# Patient Record
Sex: Female | Born: 1955 | Race: Black or African American | Hispanic: No | Smoking: Never smoker
Health system: Southern US, Community
[De-identification: ages and names within clinical notes are randomized; demographics above are authoritative.]

## PROBLEM LIST (undated history)

## (undated) DIAGNOSIS — E669 Obesity, unspecified: Secondary | ICD-10-CM

## (undated) DIAGNOSIS — M2550 Pain in unspecified joint: Secondary | ICD-10-CM

## (undated) DIAGNOSIS — Z9289 Personal history of other medical treatment: Secondary | ICD-10-CM

## (undated) DIAGNOSIS — M549 Dorsalgia, unspecified: Secondary | ICD-10-CM

## (undated) DIAGNOSIS — I10 Essential (primary) hypertension: Secondary | ICD-10-CM

## (undated) DIAGNOSIS — J302 Other seasonal allergic rhinitis: Secondary | ICD-10-CM

## (undated) DIAGNOSIS — N3090 Cystitis, unspecified without hematuria: Secondary | ICD-10-CM

## (undated) DIAGNOSIS — G8929 Other chronic pain: Secondary | ICD-10-CM

## (undated) DIAGNOSIS — K219 Gastro-esophageal reflux disease without esophagitis: Secondary | ICD-10-CM

## (undated) DIAGNOSIS — M2540 Effusion, unspecified joint: Secondary | ICD-10-CM

## (undated) DIAGNOSIS — IMO0001 Reserved for inherently not codable concepts without codable children: Secondary | ICD-10-CM

## (undated) DIAGNOSIS — R51 Headache: Secondary | ICD-10-CM

## (undated) DIAGNOSIS — E785 Hyperlipidemia, unspecified: Secondary | ICD-10-CM

## (undated) DIAGNOSIS — J45909 Unspecified asthma, uncomplicated: Secondary | ICD-10-CM

## (undated) DIAGNOSIS — B1920 Unspecified viral hepatitis C without hepatic coma: Secondary | ICD-10-CM

## (undated) DIAGNOSIS — H9190 Unspecified hearing loss, unspecified ear: Secondary | ICD-10-CM

## (undated) DIAGNOSIS — M1712 Unilateral primary osteoarthritis, left knee: Secondary | ICD-10-CM

## (undated) DIAGNOSIS — R351 Nocturia: Secondary | ICD-10-CM

## (undated) DIAGNOSIS — R519 Headache, unspecified: Secondary | ICD-10-CM

## (undated) HISTORY — DX: Headache, unspecified: R51.9

## (undated) HISTORY — DX: Unspecified viral hepatitis C without hepatic coma: B19.20

## (undated) HISTORY — PX: ABDOMINAL HYSTERECTOMY: SHX81

## (undated) HISTORY — PX: JOINT REPLACEMENT: SHX530

## (undated) HISTORY — DX: Headache: R51

## (undated) HISTORY — DX: Obesity, unspecified: E66.9

---

## 1998-03-03 ENCOUNTER — Emergency Department (HOSPITAL_COMMUNITY): Admission: EM | Admit: 1998-03-03 | Discharge: 1998-03-03 | Payer: Self-pay | Admitting: Emergency Medicine

## 1998-09-04 ENCOUNTER — Encounter: Payer: Self-pay | Admitting: Emergency Medicine

## 1998-09-04 ENCOUNTER — Emergency Department (HOSPITAL_COMMUNITY): Admission: EM | Admit: 1998-09-04 | Discharge: 1998-09-04 | Payer: Self-pay | Admitting: Emergency Medicine

## 1998-09-10 ENCOUNTER — Encounter: Admission: RE | Admit: 1998-09-10 | Discharge: 1998-09-10 | Payer: Self-pay | Admitting: *Deleted

## 1999-05-23 ENCOUNTER — Emergency Department (HOSPITAL_COMMUNITY): Admission: EM | Admit: 1999-05-23 | Discharge: 1999-05-23 | Payer: Self-pay | Admitting: Emergency Medicine

## 1999-05-23 ENCOUNTER — Encounter: Payer: Self-pay | Admitting: Emergency Medicine

## 1999-09-26 ENCOUNTER — Encounter: Payer: Self-pay | Admitting: Emergency Medicine

## 1999-09-26 ENCOUNTER — Emergency Department (HOSPITAL_COMMUNITY): Admission: EM | Admit: 1999-09-26 | Discharge: 1999-09-26 | Payer: Self-pay | Admitting: Emergency Medicine

## 1999-11-05 ENCOUNTER — Emergency Department (HOSPITAL_COMMUNITY): Admission: EM | Admit: 1999-11-05 | Discharge: 1999-11-05 | Payer: Self-pay

## 2000-07-07 ENCOUNTER — Encounter: Payer: Self-pay | Admitting: Emergency Medicine

## 2000-07-07 ENCOUNTER — Emergency Department (HOSPITAL_COMMUNITY): Admission: EM | Admit: 2000-07-07 | Discharge: 2000-07-07 | Payer: Self-pay | Admitting: Emergency Medicine

## 2000-08-10 ENCOUNTER — Emergency Department (HOSPITAL_COMMUNITY): Admission: EM | Admit: 2000-08-10 | Discharge: 2000-08-11 | Payer: Self-pay | Admitting: Internal Medicine

## 2001-04-15 ENCOUNTER — Emergency Department (HOSPITAL_COMMUNITY): Admission: EM | Admit: 2001-04-15 | Discharge: 2001-04-15 | Payer: Self-pay | Admitting: *Deleted

## 2001-04-15 ENCOUNTER — Encounter: Payer: Self-pay | Admitting: Emergency Medicine

## 2002-08-19 ENCOUNTER — Emergency Department (HOSPITAL_COMMUNITY): Admission: EM | Admit: 2002-08-19 | Discharge: 2002-08-20 | Payer: Self-pay | Admitting: Emergency Medicine

## 2002-08-20 ENCOUNTER — Encounter: Payer: Self-pay | Admitting: Emergency Medicine

## 2003-06-30 ENCOUNTER — Encounter: Admission: RE | Admit: 2003-06-30 | Discharge: 2003-06-30 | Payer: Self-pay | Admitting: Internal Medicine

## 2003-07-11 ENCOUNTER — Encounter: Admission: RE | Admit: 2003-07-11 | Discharge: 2003-10-09 | Payer: Self-pay | Admitting: Family Medicine

## 2003-08-21 ENCOUNTER — Emergency Department (HOSPITAL_COMMUNITY): Admission: EM | Admit: 2003-08-21 | Discharge: 2003-08-22 | Payer: Self-pay | Admitting: Emergency Medicine

## 2003-11-02 ENCOUNTER — Encounter: Admission: RE | Admit: 2003-11-02 | Discharge: 2004-01-31 | Payer: Self-pay | Admitting: Family Medicine

## 2004-02-26 ENCOUNTER — Emergency Department (HOSPITAL_COMMUNITY): Admission: EM | Admit: 2004-02-26 | Discharge: 2004-02-27 | Payer: Self-pay | Admitting: Emergency Medicine

## 2004-09-15 ENCOUNTER — Emergency Department (HOSPITAL_COMMUNITY): Admission: EM | Admit: 2004-09-15 | Discharge: 2004-09-15 | Payer: Self-pay | Admitting: Emergency Medicine

## 2005-02-07 ENCOUNTER — Emergency Department (HOSPITAL_COMMUNITY): Admission: EM | Admit: 2005-02-07 | Discharge: 2005-02-07 | Payer: Self-pay | Admitting: Emergency Medicine

## 2005-03-26 ENCOUNTER — Encounter: Admission: RE | Admit: 2005-03-26 | Discharge: 2005-04-15 | Payer: Self-pay | Admitting: Family Medicine

## 2005-04-12 ENCOUNTER — Emergency Department (HOSPITAL_COMMUNITY): Admission: EM | Admit: 2005-04-12 | Discharge: 2005-04-12 | Payer: Self-pay | Admitting: Emergency Medicine

## 2005-07-03 ENCOUNTER — Ambulatory Visit: Payer: Self-pay | Admitting: Obstetrics and Gynecology

## 2005-07-18 ENCOUNTER — Ambulatory Visit (HOSPITAL_COMMUNITY): Admission: RE | Admit: 2005-07-18 | Discharge: 2005-07-18 | Payer: Self-pay | Admitting: Obstetrics and Gynecology

## 2008-01-19 ENCOUNTER — Ambulatory Visit (HOSPITAL_COMMUNITY): Admission: RE | Admit: 2008-01-19 | Discharge: 2008-01-19 | Payer: Self-pay | Admitting: Family Medicine

## 2009-01-12 ENCOUNTER — Ambulatory Visit (HOSPITAL_BASED_OUTPATIENT_CLINIC_OR_DEPARTMENT_OTHER): Admission: RE | Admit: 2009-01-12 | Discharge: 2009-01-12 | Payer: Self-pay | Admitting: Orthopedic Surgery

## 2009-02-02 ENCOUNTER — Ambulatory Visit (HOSPITAL_COMMUNITY): Admission: RE | Admit: 2009-02-02 | Discharge: 2009-02-02 | Payer: Self-pay | Admitting: Family Medicine

## 2009-08-18 ENCOUNTER — Emergency Department (HOSPITAL_COMMUNITY): Admission: EM | Admit: 2009-08-18 | Discharge: 2009-08-18 | Payer: Self-pay | Admitting: Emergency Medicine

## 2010-02-01 ENCOUNTER — Ambulatory Visit (HOSPITAL_BASED_OUTPATIENT_CLINIC_OR_DEPARTMENT_OTHER): Admission: RE | Admit: 2010-02-01 | Discharge: 2010-02-01 | Payer: Self-pay | Admitting: Orthopedic Surgery

## 2010-03-18 ENCOUNTER — Ambulatory Visit (HOSPITAL_COMMUNITY): Admission: RE | Admit: 2010-03-18 | Discharge: 2010-03-18 | Payer: Self-pay | Admitting: Family Medicine

## 2010-08-08 LAB — POCT I-STAT, CHEM 8
BUN: 8 mg/dL (ref 6–23)
Creatinine, Ser: 0.7 mg/dL (ref 0.4–1.2)
Hemoglobin: 12.9 g/dL (ref 12.0–15.0)
Potassium: 3.6 mEq/L (ref 3.5–5.1)
Sodium: 142 mEq/L (ref 135–145)
TCO2: 27 mmol/L (ref 0–100)

## 2010-08-08 LAB — GLUCOSE, CAPILLARY
Glucose-Capillary: 150 mg/dL — ABNORMAL HIGH (ref 70–99)
Glucose-Capillary: 153 mg/dL — ABNORMAL HIGH (ref 70–99)

## 2010-08-08 LAB — POCT HEMOGLOBIN-HEMACUE: Hemoglobin: 11.4 g/dL — ABNORMAL LOW (ref 12.0–15.0)

## 2010-08-19 LAB — COMPREHENSIVE METABOLIC PANEL
BUN: 10 mg/dL (ref 6–23)
CO2: 24 mEq/L (ref 19–32)
Calcium: 9.8 mg/dL (ref 8.4–10.5)
Chloride: 107 mEq/L (ref 96–112)
Creatinine, Ser: 0.93 mg/dL (ref 0.4–1.2)
GFR calc non Af Amer: >60 mL/min (ref >60)
Glucose, Bld: 192 mg/dL — ABNORMAL HIGH (ref 70–99)
Total Bilirubin: 0.6 mg/dL (ref 0.3–1.2)

## 2010-08-19 LAB — DIFFERENTIAL
Basophils Absolute: 0.1 10*3/uL (ref 0.0–0.1)
Eosinophils Relative: 1 % (ref 0–5)
Lymphocytes Relative: 34 % (ref 12–46)
Neutro Abs: 3.5 10*3/uL (ref 1.7–7.7)
Neutrophils Relative %: 56 % (ref 43–77)

## 2010-08-19 LAB — CBC
HCT: 39.7 % (ref 36.0–46.0)
MCHC: 31.3 g/dL (ref 30.0–36.0)
MCV: 82.1 fL (ref 78.0–100.0)
RBC: 4.84 MIL/uL (ref 3.87–5.11)
WBC: 6.4 10*3/uL (ref 4.0–10.5)

## 2010-08-19 LAB — URINALYSIS, ROUTINE W REFLEX MICROSCOPIC
Hgb urine dipstick: NEGATIVE
Nitrite: NEGATIVE
Protein, ur: 30 mg/dL — AB
Specific Gravity, Urine: 1.033 — ABNORMAL HIGH (ref 1.005–1.030)
Urobilinogen, UA: 1 mg/dL (ref 0.0–1.0)

## 2010-08-19 LAB — POCT CARDIAC MARKERS: CKMB, poc: <1 ng/mL — ABNORMAL LOW (ref 1.0–8.0)

## 2010-08-19 LAB — URINE CULTURE: Colony Count: 25000

## 2010-08-19 LAB — LIPASE, BLOOD: Lipase: 19 U/L (ref 11–59)

## 2010-08-31 LAB — BASIC METABOLIC PANEL
CO2: 28 mEq/L (ref 19–32)
Calcium: 9.8 mg/dL (ref 8.4–10.5)
GFR calc Af Amer: >60 mL/min (ref >60)
GFR calc non Af Amer: >60 mL/min (ref >60)
Potassium: 3.9 mEq/L (ref 3.5–5.1)
Sodium: 143 mEq/L (ref 135–145)

## 2010-10-08 NOTE — Op Note (Signed)
Tabitha Oneal, Tabitha Oneal           ACCOUNT NO.:  [id number]   MEDICAL RECORD NO.:  [id number]          PATIENT TYPE:  AMB   LOCATION:  DSC                          FACILITY:  [facility]   PHYSICIAN:  Eulas Post, MD    DATE OF BIRTH:  Apr 19, 1956   DATE OF PROCEDURE:  01/12/2009  DATE OF DISCHARGE:                               OPERATIVE REPORT   SURGEON:  Eulas Post, MD   PREOPERATIVE DIAGNOSIS:  Left knee medial meniscus tear.   POSTOPERATIVE DIAGNOSES:  1. Left knee medial meniscus tear.  2. Patellar chondromalacia.   OPERATIVE PROCEDURE:  Left knee arthroscopy with partial medial  meniscectomy and patellar chondroplasty.   ANESTHESIA:  General.   ESTIMATED BLOOD LOSS:  Minimal.   PREOPERATIVE INDICATIONS:  Mrs. Tabitha Oneal is a 55 year old woman  who had chronic left knee pain.  She had medial-sided knee pain as well  as some difficulty with stairs.  She had a long course of conservative  therapy including over 6 months of injections, anti-inflammatories, and  activity modifications.  These did not succeed in resolving her symptoms  and she elected to undergo the above-named procedures.  The risks,  benefits, and alternatives were discussed with her preoperatively  including but not limited to the risks of infection, bleeding, nerve  injury, recurrent knee pain, arthrofibrosis, progression of arthritis,  cardiopulmonary complications, blood clots, among others, and she is  willing to proceed.   OPERATIVE FINDINGS:  The patella had grade 2 chondromalacia.  The  femoral trochlea was intact.  The lateral compartment was normal.  The  ACL and PCL were intact.  The medial compartment had grade 1 changes on  the femur and tibia with a complex posterior horn medial meniscus tear.   OPERATIVE PROCEDURE:  The patient was brought to the operating room and  placed in supine position.  Antibiotics were given.  The left lower  extremity was prepped and draped in the usual  sterile fashion.  Diagnostic arthroscopy was carried out with the above-named findings.  The medial meniscus was debrided with a combination of the arthroscopic  shaver and the arthroscopic biters.  This was cleaned back to a stable  rim.  The undersurface of the patella was also debrided using the  arthroscopic shaver.  Stable chondral surface was achieved.  The knee  was  irrigated copiously and all loose bodies removed, and the portals closed  with Monocryl followed by Steri-Strips and sterile gauze.  The knee was  injected with Marcaine.  There were no complications, and she tolerated  the procedure well.      Eulas Post, MD  Electronically Signed     JPL/MEDQ  D:  01/12/2009  T:  01/13/2009  Job:  [id number]

## 2011-03-21 ENCOUNTER — Ambulatory Visit
Admission: RE | Admit: 2011-03-21 | Discharge: 2011-03-21 | Disposition: A | Discharge status: Home / Self Care | Payer: Medicaid Other | Source: Ambulatory Visit | Attending: Family Medicine | Admitting: Family Medicine

## 2011-03-21 ENCOUNTER — Other Ambulatory Visit: Payer: Self-pay | Admitting: Family Medicine

## 2011-10-30 ENCOUNTER — Other Ambulatory Visit: Payer: Self-pay | Admitting: Family Medicine

## 2011-10-30 DIAGNOSIS — Z0000 Encounter for general adult medical examination without abnormal findings: Secondary | ICD-10-CM | POA: Diagnosis not present

## 2011-10-30 DIAGNOSIS — E7800 Pure hypercholesterolemia, unspecified: Secondary | ICD-10-CM | POA: Diagnosis not present

## 2011-10-30 DIAGNOSIS — I10 Essential (primary) hypertension: Secondary | ICD-10-CM | POA: Diagnosis not present

## 2011-10-30 DIAGNOSIS — Z1231 Encounter for screening mammogram for malignant neoplasm of breast: Secondary | ICD-10-CM

## 2011-10-30 DIAGNOSIS — J45909 Unspecified asthma, uncomplicated: Secondary | ICD-10-CM | POA: Diagnosis not present

## 2011-10-30 DIAGNOSIS — M949 Disorder of cartilage, unspecified: Secondary | ICD-10-CM | POA: Diagnosis not present

## 2011-10-30 DIAGNOSIS — J309 Allergic rhinitis, unspecified: Secondary | ICD-10-CM | POA: Diagnosis not present

## 2011-10-30 DIAGNOSIS — E119 Type 2 diabetes mellitus without complications: Secondary | ICD-10-CM | POA: Diagnosis not present

## 2011-10-30 DIAGNOSIS — M899 Disorder of bone, unspecified: Secondary | ICD-10-CM | POA: Diagnosis not present

## 2011-10-30 DIAGNOSIS — M159 Polyosteoarthritis, unspecified: Secondary | ICD-10-CM | POA: Diagnosis not present

## 2011-10-30 DIAGNOSIS — Z23 Encounter for immunization: Secondary | ICD-10-CM | POA: Diagnosis not present

## 2011-11-03 ENCOUNTER — Ambulatory Visit
Admission: RE | Admit: 2011-11-03 | Discharge: 2011-11-03 | Disposition: A | Discharge status: Home / Self Care | Payer: Medicare Other | Source: Ambulatory Visit | Attending: Family Medicine | Admitting: Family Medicine

## 2011-11-03 DIAGNOSIS — Z1231 Encounter for screening mammogram for malignant neoplasm of breast: Secondary | ICD-10-CM | POA: Diagnosis not present

## 2011-11-18 DIAGNOSIS — M949 Disorder of cartilage, unspecified: Secondary | ICD-10-CM | POA: Diagnosis not present

## 2011-11-18 DIAGNOSIS — M899 Disorder of bone, unspecified: Secondary | ICD-10-CM | POA: Diagnosis not present

## 2011-11-25 DIAGNOSIS — H9190 Unspecified hearing loss, unspecified ear: Secondary | ICD-10-CM | POA: Diagnosis not present

## 2011-12-10 DIAGNOSIS — M23329 Other meniscus derangements, posterior horn of medial meniscus, unspecified knee: Secondary | ICD-10-CM | POA: Diagnosis not present

## 2011-12-10 DIAGNOSIS — M23359 Other meniscus derangements, posterior horn of lateral meniscus, unspecified knee: Secondary | ICD-10-CM | POA: Diagnosis not present

## 2011-12-25 ENCOUNTER — Telehealth: Payer: Self-pay | Admitting: Obstetrics and Gynecology

## 2011-12-25 NOTE — Telephone Encounter (Signed)
Triage/general quest. 

## 2011-12-25 NOTE — Telephone Encounter (Signed)
Returned pt's call. Informed not record of her being a pt here. May have called incorrect office. Pt verbalizes comprehension.

## 2011-12-30 DIAGNOSIS — M79609 Pain in unspecified limb: Secondary | ICD-10-CM | POA: Diagnosis not present

## 2012-01-05 DIAGNOSIS — M79609 Pain in unspecified limb: Secondary | ICD-10-CM | POA: Diagnosis not present

## 2012-01-09 DIAGNOSIS — M1710 Unilateral primary osteoarthritis, unspecified knee: Secondary | ICD-10-CM | POA: Diagnosis not present

## 2012-01-12 DIAGNOSIS — M1710 Unilateral primary osteoarthritis, unspecified knee: Secondary | ICD-10-CM | POA: Diagnosis not present

## 2012-02-02 DIAGNOSIS — J309 Allergic rhinitis, unspecified: Secondary | ICD-10-CM | POA: Diagnosis not present

## 2012-02-02 DIAGNOSIS — J45909 Unspecified asthma, uncomplicated: Secondary | ICD-10-CM | POA: Diagnosis not present

## 2012-02-06 ENCOUNTER — Other Ambulatory Visit: Payer: Self-pay | Admitting: Orthopedic Surgery

## 2012-02-09 ENCOUNTER — Encounter (HOSPITAL_COMMUNITY)
Admission: RE | Admit: 2012-02-09 | Discharge: 2012-02-09 | Disposition: A | Discharge status: Home / Self Care | Payer: Medicare Other | Source: Ambulatory Visit | Attending: Orthopedic Surgery | Admitting: Orthopedic Surgery

## 2012-02-09 ENCOUNTER — Encounter (HOSPITAL_COMMUNITY): Payer: Self-pay

## 2012-02-09 DIAGNOSIS — Z96659 Presence of unspecified artificial knee joint: Secondary | ICD-10-CM | POA: Diagnosis not present

## 2012-02-09 DIAGNOSIS — M1710 Unilateral primary osteoarthritis, unspecified knee: Secondary | ICD-10-CM | POA: Diagnosis not present

## 2012-02-09 DIAGNOSIS — I10 Essential (primary) hypertension: Secondary | ICD-10-CM | POA: Diagnosis not present

## 2012-02-09 DIAGNOSIS — E119 Type 2 diabetes mellitus without complications: Secondary | ICD-10-CM | POA: Diagnosis not present

## 2012-02-09 DIAGNOSIS — D62 Acute posthemorrhagic anemia: Secondary | ICD-10-CM | POA: Diagnosis not present

## 2012-02-09 HISTORY — DX: Essential (primary) hypertension: I10

## 2012-02-09 HISTORY — DX: Unspecified asthma, uncomplicated: J45.909

## 2012-02-09 LAB — URINALYSIS, ROUTINE W REFLEX MICROSCOPIC
Bilirubin Urine: NEGATIVE
Ketones, ur: NEGATIVE mg/dL
Nitrite: NEGATIVE
Protein, ur: 30 mg/dL — AB
Urobilinogen, UA: 1 mg/dL (ref 0.0–1.0)

## 2012-02-09 LAB — ABO/RH: ABO/RH(D): O POS

## 2012-02-09 LAB — TYPE AND SCREEN: Antibody Screen: NEGATIVE

## 2012-02-09 LAB — BASIC METABOLIC PANEL
BUN: 9 mg/dL (ref 6–23)
Chloride: 103 mEq/L (ref 96–112)
GFR calc Af Amer: >90 mL/min (ref >90)
GFR calc non Af Amer: >90 mL/min (ref >90)
Glucose, Bld: 149 mg/dL — ABNORMAL HIGH (ref 70–99)
Potassium: 3.7 mEq/L (ref 3.5–5.1)
Sodium: 140 mEq/L (ref 135–145)

## 2012-02-09 LAB — URINE MICROSCOPIC-ADD ON

## 2012-02-09 LAB — CBC
HCT: 33.4 % — ABNORMAL LOW (ref 36.0–46.0)
Hemoglobin: 10.8 g/dL — ABNORMAL LOW (ref 12.0–15.0)
RBC: 4.19 MIL/uL (ref 3.87–5.11)

## 2012-02-09 LAB — PROTIME-INR
INR: 1.02 (ref 0.00–1.49)
Prothrombin Time: 13.6 seconds (ref 11.6–15.2)

## 2012-02-09 NOTE — Pre-Procedure Instructions (Signed)
20 Tabitha Oneal  02/09/2012   Your procedure is scheduled on:  02/23/12  Report to [facility] at 1130 AM.  Call this number if you have problems the morning of surgery: [phone]   Remember:   Do not eat food:After Midnight.    Take these medicines the morning of surgery with A SIP OF WATER: all inhalers        Do not wear jewelry, make-up or nail polish.  Do not wear lotions, powders, or perfumes. You may wear deodorant.  Do not shave 48 hours prior to surgery. Men may shave face and neck.  Do not bring valuables to the hospital.  Contacts, dentures or bridgework may not be worn into surgery.  Leave suitcase in the car. After surgery it may be brought to your room.  For patients admitted to the hospital, checkout time is 11:00 AM the day of discharge.   Patients discharged the day of surgery will not be allowed to drive home.  Name and phone number of your driver: husband  Special Instructions: CHG Shower Use Special Wash: 1/2 bottle night before surgery and 1/2 bottle morning of surgery.   Please read over the following fact sheets that you were given: Pain Booklet, Coughing and Deep Breathing, Blood Transfusion Information, MRSA Information and Surgical Site Infection Prevention

## 2012-02-22 MED ORDER — CEFAZOLIN SODIUM-DEXTROSE 2-3 GM-% IV SOLR: 2.0000 g | INTRAVENOUS | Status: DC

## 2012-02-23 ENCOUNTER — Encounter (HOSPITAL_COMMUNITY)
Admission: RE | Disposition: A | Discharge status: Home Health | Payer: Self-pay | Source: Ambulatory Visit | Attending: Orthopedic Surgery

## 2012-02-23 ENCOUNTER — Encounter (HOSPITAL_COMMUNITY): Payer: Self-pay | Admitting: Anesthesiology

## 2012-02-23 ENCOUNTER — Encounter (HOSPITAL_COMMUNITY): Payer: Self-pay | Admitting: *Deleted

## 2012-02-23 ENCOUNTER — Inpatient Hospital Stay (HOSPITAL_COMMUNITY): Payer: Medicare Other | Admitting: Anesthesiology

## 2012-02-23 ENCOUNTER — Inpatient Hospital Stay (HOSPITAL_COMMUNITY)
Admission: RE | Admit: 2012-02-23 | Discharge: 2012-02-29 | DRG: 470 | Disposition: A | Discharge status: Home Health | Payer: Medicare Other | Source: Ambulatory Visit | Attending: Orthopedic Surgery | Admitting: Orthopedic Surgery

## 2012-02-23 ENCOUNTER — Inpatient Hospital Stay (HOSPITAL_COMMUNITY): Payer: Medicare Other

## 2012-02-23 ENCOUNTER — Encounter (HOSPITAL_COMMUNITY): Payer: Self-pay | Admitting: Orthopedic Surgery

## 2012-02-23 DIAGNOSIS — M1710 Unilateral primary osteoarthritis, unspecified knee: Secondary | ICD-10-CM | POA: Diagnosis not present

## 2012-02-23 DIAGNOSIS — D62 Acute posthemorrhagic anemia: Secondary | ICD-10-CM | POA: Diagnosis not present

## 2012-02-23 DIAGNOSIS — I10 Essential (primary) hypertension: Secondary | ICD-10-CM | POA: Diagnosis present

## 2012-02-23 DIAGNOSIS — J45909 Unspecified asthma, uncomplicated: Secondary | ICD-10-CM | POA: Diagnosis present

## 2012-02-23 DIAGNOSIS — Z7901 Long term (current) use of anticoagulants: Secondary | ICD-10-CM | POA: Diagnosis not present

## 2012-02-23 DIAGNOSIS — G8918 Other acute postprocedural pain: Secondary | ICD-10-CM | POA: Diagnosis not present

## 2012-02-23 DIAGNOSIS — Z96659 Presence of unspecified artificial knee joint: Secondary | ICD-10-CM | POA: Diagnosis not present

## 2012-02-23 DIAGNOSIS — E119 Type 2 diabetes mellitus without complications: Secondary | ICD-10-CM | POA: Diagnosis present

## 2012-02-23 DIAGNOSIS — Z471 Aftercare following joint replacement surgery: Secondary | ICD-10-CM | POA: Diagnosis not present

## 2012-02-23 DIAGNOSIS — M1712 Unilateral primary osteoarthritis, left knee: Secondary | ICD-10-CM | POA: Diagnosis present

## 2012-02-23 DIAGNOSIS — Z79899 Other long term (current) drug therapy: Secondary | ICD-10-CM

## 2012-02-23 HISTORY — PX: TOTAL KNEE ARTHROPLASTY: SHX125

## 2012-02-23 HISTORY — DX: Unilateral primary osteoarthritis, left knee: M17.12

## 2012-02-23 LAB — GLUCOSE, CAPILLARY: Glucose-Capillary: 127 mg/dL — ABNORMAL HIGH (ref 70–99)

## 2012-02-23 SURGERY — ARTHROPLASTY, KNEE, TOTAL
Anesthesia: General | Site: Knee | Laterality: Left | Wound class: Clean

## 2012-02-23 MED ORDER — DIPHENHYDRAMINE HCL 12.5 MG/5ML PO ELIX
12.5000 mg | ORAL_SOLUTION | ORAL | Status: DC | PRN
  Administered 2012-02-24: 25 mg via ORAL
  Filled 2012-02-23: qty 5

## 2012-02-23 MED ORDER — METOCLOPRAMIDE HCL 10 MG PO TABS: 5.0000 mg | ORAL_TABLET | Freq: Three times a day (TID) | ORAL | Status: DC | PRN

## 2012-02-23 MED ORDER — BISACODYL 5 MG PO TBEC: 5.0000 mg | DELAYED_RELEASE_TABLET | Freq: Every day | ORAL | Status: DC | PRN

## 2012-02-23 MED ORDER — POLYETHYLENE GLYCOL 3350 17 G PO PACK: 17.0000 g | PACK | Freq: Every day | ORAL | Status: DC | PRN

## 2012-02-23 MED ORDER — METHOCARBAMOL 100 MG/ML IJ SOLN
500.0000 mg | Freq: Four times a day (QID) | INTRAVENOUS | Status: DC | PRN
  Administered 2012-02-23: 500 mg via INTRAVENOUS
  Filled 2012-02-23: qty 5

## 2012-02-23 MED ORDER — MIDAZOLAM HCL 2 MG/2ML IJ SOLN
INTRAMUSCULAR | Status: AC
  Filled 2012-02-23: qty 2

## 2012-02-23 MED ORDER — ALBUTEROL SULFATE HFA 108 (90 BASE) MCG/ACT IN AERS: 2.0000 | INHALATION_SPRAY | RESPIRATORY_TRACT | Status: DC | PRN

## 2012-02-23 MED ORDER — LACTATED RINGERS IV SOLN
INTRAVENOUS | Status: DC
  Administered 2012-02-23: 13:00:00 via INTRAVENOUS

## 2012-02-23 MED ORDER — ACETAMINOPHEN 325 MG PO TABS
650.0000 mg | ORAL_TABLET | Freq: Four times a day (QID) | ORAL | Status: DC | PRN
  Administered 2012-02-24 – 2012-02-28 (×5): 650 mg via ORAL
  Filled 2012-02-23 (×5): qty 2

## 2012-02-23 MED ORDER — INSULIN ASPART 100 UNIT/ML ~~LOC~~ SOLN
0.0000 [IU] | Freq: Three times a day (TID) | SUBCUTANEOUS | Status: DC
  Administered 2012-02-24 – 2012-02-25 (×5): 2 [IU] via SUBCUTANEOUS
  Administered 2012-02-26 (×2): 3 [IU] via SUBCUTANEOUS
  Administered 2012-02-26: 2 [IU] via SUBCUTANEOUS
  Administered 2012-02-27: 3 [IU] via SUBCUTANEOUS
  Administered 2012-02-27 – 2012-02-29 (×5): 2 [IU] via SUBCUTANEOUS

## 2012-02-23 MED ORDER — DOCUSATE SODIUM 100 MG PO CAPS
100.0000 mg | ORAL_CAPSULE | Freq: Two times a day (BID) | ORAL | Status: DC
  Administered 2012-02-23 – 2012-02-29 (×12): 100 mg via ORAL
  Filled 2012-02-23 (×13): qty 1

## 2012-02-23 MED ORDER — LINAGLIPTIN 5 MG PO TABS
5.0000 mg | ORAL_TABLET | Freq: Every day | ORAL | Status: DC
  Administered 2012-02-23 – 2012-02-29 (×7): 5 mg via ORAL
  Filled 2012-02-23 (×7): qty 1

## 2012-02-23 MED ORDER — FENTANYL CITRATE 0.05 MG/ML IJ SOLN
INTRAMUSCULAR | Status: AC
  Filled 2012-02-23: qty 2

## 2012-02-23 MED ORDER — ONDANSETRON HCL 4 MG/2ML IJ SOLN
INTRAMUSCULAR | Status: DC | PRN
  Administered 2012-02-23: 4 mg via INTRAVENOUS

## 2012-02-23 MED ORDER — ONDANSETRON HCL 4 MG PO TABS: 4.0000 mg | ORAL_TABLET | Freq: Four times a day (QID) | ORAL | Status: DC | PRN

## 2012-02-23 MED ORDER — ACETAMINOPHEN 10 MG/ML IV SOLN
INTRAVENOUS | Status: AC
  Filled 2012-02-23: qty 100

## 2012-02-23 MED ORDER — ALUM & MAG HYDROXIDE-SIMETH 200-200-20 MG/5ML PO SUSP: 30.0000 mL | ORAL | Status: DC | PRN

## 2012-02-23 MED ORDER — LIDOCAINE HCL (CARDIAC) 20 MG/ML IV SOLN
INTRAVENOUS | Status: DC | PRN
  Administered 2012-02-23: 50 mg via INTRAVENOUS

## 2012-02-23 MED ORDER — MIDAZOLAM HCL 2 MG/2ML IJ SOLN
1.0000 mg | INTRAMUSCULAR | Status: DC | PRN
  Administered 2012-02-23: 1 mg via INTRAVENOUS

## 2012-02-23 MED ORDER — SODIUM CHLORIDE 0.9 % IR SOLN
Status: DC | PRN
  Administered 2012-02-23: 3000 mL

## 2012-02-23 MED ORDER — ACETAMINOPHEN 650 MG RE SUPP: 650.0000 mg | Freq: Four times a day (QID) | RECTAL | Status: DC | PRN

## 2012-02-23 MED ORDER — MIDAZOLAM HCL 5 MG/5ML IJ SOLN
INTRAMUSCULAR | Status: DC | PRN
  Administered 2012-02-23: 2 mg via INTRAVENOUS

## 2012-02-23 MED ORDER — SENNA 8.6 MG PO TABS
1.0000 | ORAL_TABLET | Freq: Two times a day (BID) | ORAL | Status: DC
  Administered 2012-02-23 – 2012-02-29 (×12): 8.6 mg via ORAL
  Filled 2012-02-23 (×13): qty 1

## 2012-02-23 MED ORDER — WARFARIN - PHARMACIST DOSING INPATIENT
Freq: Every day | Status: DC
  Administered 2012-02-25: 18:00:00

## 2012-02-23 MED ORDER — OXYCODONE HCL 5 MG PO TABS
5.0000 mg | ORAL_TABLET | ORAL | Status: DC | PRN
  Administered 2012-02-23 – 2012-02-29 (×27): 10 mg via ORAL
  Filled 2012-02-23 (×27): qty 2

## 2012-02-23 MED ORDER — PHENOL 1.4 % MT LIQD
1.0000 | OROMUCOSAL | Status: DC | PRN
  Filled 2012-02-23: qty 177

## 2012-02-23 MED ORDER — METOCLOPRAMIDE HCL 5 MG/ML IJ SOLN: 5.0000 mg | Freq: Three times a day (TID) | INTRAMUSCULAR | Status: DC | PRN

## 2012-02-23 MED ORDER — AZELASTINE HCL 0.1 % NA SOLN
1.0000 | Freq: Two times a day (BID) | NASAL | Status: DC
  Administered 2012-02-23 – 2012-02-29 (×12): 1 via NASAL
  Filled 2012-02-23: qty 30

## 2012-02-23 MED ORDER — OXYCODONE-ACETAMINOPHEN 10-325 MG PO TABS: 1.0000 | ORAL_TABLET | Freq: Four times a day (QID) | ORAL | Status: DC | PRN

## 2012-02-23 MED ORDER — CEFAZOLIN SODIUM-DEXTROSE 2-3 GM-% IV SOLR
INTRAVENOUS | Status: AC
  Administered 2012-02-23: 2 g via INTRAVENOUS
  Filled 2012-02-23: qty 50

## 2012-02-23 MED ORDER — ACETAMINOPHEN 10 MG/ML IV SOLN
1000.0000 mg | Freq: Once | INTRAVENOUS | Status: AC | PRN
  Administered 2012-02-23: 1000 mg via INTRAVENOUS

## 2012-02-23 MED ORDER — MENTHOL 3 MG MT LOZG: 1.0000 | LOZENGE | OROMUCOSAL | Status: DC | PRN

## 2012-02-23 MED ORDER — PATIENT'S GUIDE TO USING COUMADIN BOOK
Freq: Once | Status: AC
  Administered 2012-02-23: 22:00:00
  Filled 2012-02-23: qty 1

## 2012-02-23 MED ORDER — INFLUENZA VIRUS VACC SPLIT PF IM SUSP
0.5000 mL | INTRAMUSCULAR | Status: AC
  Administered 2012-02-24: 0.5 mL via INTRAMUSCULAR
  Filled 2012-02-23: qty 0.5

## 2012-02-23 MED ORDER — LISINOPRIL 10 MG PO TABS
10.0000 mg | ORAL_TABLET | Freq: Every day | ORAL | Status: DC
  Administered 2012-02-23 – 2012-02-29 (×6): 10 mg via ORAL
  Filled 2012-02-23 (×7): qty 1

## 2012-02-23 MED ORDER — SENNA-DOCUSATE SODIUM 8.6-50 MG PO TABS: 1.0000 | ORAL_TABLET | Freq: Every day | ORAL | Status: DC

## 2012-02-23 MED ORDER — METHOCARBAMOL 500 MG PO TABS
500.0000 mg | ORAL_TABLET | Freq: Four times a day (QID) | ORAL | Status: DC | PRN
  Administered 2012-02-23 – 2012-02-28 (×5): 500 mg via ORAL
  Filled 2012-02-23 (×6): qty 1

## 2012-02-23 MED ORDER — ONDANSETRON HCL 4 MG/2ML IJ SOLN: 4.0000 mg | Freq: Four times a day (QID) | INTRAMUSCULAR | Status: DC | PRN

## 2012-02-23 MED ORDER — HYDROMORPHONE HCL PF 1 MG/ML IJ SOLN
INTRAMUSCULAR | Status: AC
  Filled 2012-02-23: qty 1

## 2012-02-23 MED ORDER — WARFARIN SODIUM 7.5 MG PO TABS
7.5000 mg | ORAL_TABLET | Freq: Every day | ORAL | Status: DC
  Administered 2012-02-23 – 2012-02-27 (×3): 7.5 mg via ORAL
  Filled 2012-02-23 (×6): qty 1

## 2012-02-23 MED ORDER — CEFAZOLIN SODIUM-DEXTROSE 2-3 GM-% IV SOLR
2.0000 g | Freq: Four times a day (QID) | INTRAVENOUS | Status: AC
  Administered 2012-02-23 – 2012-02-24 (×2): 2 g via INTRAVENOUS
  Filled 2012-02-23 (×2): qty 50

## 2012-02-23 MED ORDER — MONTELUKAST SODIUM 10 MG PO TABS
10.0000 mg | ORAL_TABLET | Freq: Every day | ORAL | Status: DC
  Administered 2012-02-23 – 2012-02-28 (×6): 10 mg via ORAL
  Filled 2012-02-23 (×7): qty 1

## 2012-02-23 MED ORDER — PROMETHAZINE HCL 25 MG PO TABS: 25.0000 mg | ORAL_TABLET | Freq: Four times a day (QID) | ORAL | Status: DC | PRN

## 2012-02-23 MED ORDER — METHOCARBAMOL 500 MG PO TABS: 500.0000 mg | ORAL_TABLET | Freq: Four times a day (QID) | ORAL | Status: DC

## 2012-02-23 MED ORDER — AZELASTINE HCL 0.15 % NA SOLN: 1.0000 | Freq: Two times a day (BID) | NASAL | Status: DC

## 2012-02-23 MED ORDER — ACETAMINOPHEN 10 MG/ML IV SOLN
1000.0000 mg | Freq: Four times a day (QID) | INTRAVENOUS | Status: AC
  Administered 2012-02-23 – 2012-02-24 (×3): 1000 mg via INTRAVENOUS
  Filled 2012-02-23 (×4): qty 100

## 2012-02-23 MED ORDER — HYDROMORPHONE HCL PF 1 MG/ML IJ SOLN: 1.0000 mg | INTRAMUSCULAR | Status: DC | PRN

## 2012-02-23 MED ORDER — POTASSIUM CHLORIDE IN NACL 20-0.45 MEQ/L-% IV SOLN
INTRAVENOUS | Status: DC
  Administered 2012-02-24: 16:00:00 via INTRAVENOUS
  Filled 2012-02-23 (×12): qty 1000

## 2012-02-23 MED ORDER — ONDANSETRON HCL 4 MG/2ML IJ SOLN: 4.0000 mg | Freq: Once | INTRAMUSCULAR | Status: DC | PRN

## 2012-02-23 MED ORDER — FENTANYL CITRATE 0.05 MG/ML IJ SOLN
INTRAMUSCULAR | Status: DC | PRN
  Administered 2012-02-23 (×6): 50 ug via INTRAVENOUS
  Administered 2012-02-23: 100 ug via INTRAVENOUS
  Administered 2012-02-23 (×2): 50 ug via INTRAVENOUS

## 2012-02-23 MED ORDER — ENOXAPARIN SODIUM 30 MG/0.3ML ~~LOC~~ SOLN
30.0000 mg | Freq: Two times a day (BID) | SUBCUTANEOUS | Status: DC
  Administered 2012-02-24 – 2012-02-27 (×7): 30 mg via SUBCUTANEOUS
  Filled 2012-02-23 (×9): qty 0.3

## 2012-02-23 MED ORDER — WARFARIN VIDEO
Freq: Once | Status: AC
  Administered 2012-02-23: 22:00:00

## 2012-02-23 MED ORDER — FLUTICASONE PROPIONATE HFA 44 MCG/ACT IN AERO
2.0000 | INHALATION_SPRAY | Freq: Two times a day (BID) | RESPIRATORY_TRACT | Status: DC
  Administered 2012-02-23 – 2012-02-29 (×12): 2 via RESPIRATORY_TRACT
  Filled 2012-02-23: qty 10.6

## 2012-02-23 MED ORDER — PROPOFOL 10 MG/ML IV BOLUS
INTRAVENOUS | Status: DC | PRN
  Administered 2012-02-23: 180 mg via INTRAVENOUS

## 2012-02-23 MED ORDER — WARFARIN SODIUM 5 MG PO TABS: 5.0000 mg | ORAL_TABLET | Freq: Every day | ORAL | Status: DC

## 2012-02-23 MED ORDER — FENTANYL CITRATE 0.05 MG/ML IJ SOLN
50.0000 ug | INTRAMUSCULAR | Status: DC | PRN
  Administered 2012-02-23: 50 ug via INTRAVENOUS

## 2012-02-23 MED ORDER — SORBITOL 70 % SOLN
30.0000 mL | Freq: Every day | Status: DC | PRN
  Administered 2012-02-27: 30 mL via ORAL
  Filled 2012-02-23: qty 30

## 2012-02-23 MED ORDER — LACTATED RINGERS IV SOLN
INTRAVENOUS | Status: DC | PRN
  Administered 2012-02-23 (×3): via INTRAVENOUS

## 2012-02-23 MED ORDER — HYDROMORPHONE HCL PF 1 MG/ML IJ SOLN
0.2500 mg | INTRAMUSCULAR | Status: DC | PRN
  Administered 2012-02-23 (×3): 0.5 mg via INTRAVENOUS

## 2012-02-23 MED ORDER — ATORVASTATIN CALCIUM 80 MG PO TABS
80.0000 mg | ORAL_TABLET | Freq: Every day | ORAL | Status: DC
  Administered 2012-02-23 – 2012-02-28 (×6): 80 mg via ORAL
  Filled 2012-02-23 (×7): qty 1

## 2012-02-23 SURGICAL SUPPLY — 56 items
APL SKNCLS STERI-STRIP NONHPOA (GAUZE/BANDAGES/DRESSINGS) ×1
BANDAGE ELASTIC 6 VELCRO ST LF (GAUZE/BANDAGES/DRESSINGS) ×4 IMPLANT
BANDAGE ESMARK 6X9 LF (GAUZE/BANDAGES/DRESSINGS) ×1 IMPLANT
BENZOIN TINCTURE PRP APPL 2/3 (GAUZE/BANDAGES/DRESSINGS) ×2 IMPLANT
BLADE SAG 18X100X1.27 (BLADE) ×2 IMPLANT
BLADE SAW RECIP 87.9 MT (BLADE) ×2 IMPLANT
BLADE SAW SGTL 13X75X1.27 (BLADE) ×2 IMPLANT
BNDG CMPR 9X6 STRL LF SNTH (GAUZE/BANDAGES/DRESSINGS) ×1
BNDG ESMARK 6X9 LF (GAUZE/BANDAGES/DRESSINGS) ×2
BOOTCOVER CLEANROOM LRG (PROTECTIVE WEAR) ×4 IMPLANT
BOWL SMART MIX CTS (DISPOSABLE) ×2 IMPLANT
CEMENT HV SMART SET (Cement) ×4 IMPLANT
CLOTH BEACON ORANGE TIMEOUT ST (SAFETY) ×2 IMPLANT
COVER SURGICAL LIGHT HANDLE (MISCELLANEOUS) ×2 IMPLANT
CUFF TOURNIQUET SINGLE 34IN LL (TOURNIQUET CUFF) ×1 IMPLANT
DRAPE EXTREMITY T 121X128X90 (DRAPE) ×2 IMPLANT
DRAPE PROXIMA HALF (DRAPES) ×2 IMPLANT
DRAPE U-SHAPE 47X51 STRL (DRAPES) ×2 IMPLANT
DRSG PAD ABDOMINAL 8X10 ST (GAUZE/BANDAGES/DRESSINGS) ×2 IMPLANT
DURAPREP 26ML APPLICATOR (WOUND CARE) ×2 IMPLANT
ELECT CAUTERY BLADE 6.4 (BLADE) ×2 IMPLANT
ELECT REM PT RETURN 9FT ADLT (ELECTROSURGICAL) ×2
ELECTRODE REM PT RTRN 9FT ADLT (ELECTROSURGICAL) ×1 IMPLANT
EVACUATOR 1/8 PVC DRAIN (DRAIN) IMPLANT
FACESHIELD LNG OPTICON STERILE (SAFETY) ×2 IMPLANT
GLOVE BIOGEL PI IND STRL 8 (GLOVE) ×2 IMPLANT
GLOVE BIOGEL PI INDICATOR 8 (GLOVE) ×2
GLOVE ORTHO TXT STRL SZ7.5 (GLOVE) ×2 IMPLANT
GLOVE SURG ORTHO 8.0 STRL STRW (GLOVE) ×2 IMPLANT
HANDPIECE INTERPULSE COAX TIP (DISPOSABLE) ×2
HOOD PEEL AWAY FACE SHEILD DIS (HOOD) ×4 IMPLANT
KIT BASIN OR (CUSTOM PROCEDURE TRAY) ×2 IMPLANT
KIT ROOM TURNOVER OR (KITS) ×2 IMPLANT
MANIFOLD NEPTUNE II (INSTRUMENTS) ×2 IMPLANT
NS IRRIG 1000ML POUR BTL (IV SOLUTION) ×2 IMPLANT
PACK TOTAL JOINT (CUSTOM PROCEDURE TRAY) ×2 IMPLANT
PAD ARMBOARD 7.5X6 YLW CONV (MISCELLANEOUS) ×4 IMPLANT
PAD CAST 4YDX4 CTTN HI CHSV (CAST SUPPLIES) ×1 IMPLANT
PADDING CAST COTTON 4X4 STRL (CAST SUPPLIES) ×2
PADDING CAST COTTON 6X4 STRL (CAST SUPPLIES) ×2 IMPLANT
SET HNDPC FAN SPRY TIP SCT (DISPOSABLE) ×1 IMPLANT
SPONGE GAUZE 4X4 12PLY (GAUZE/BANDAGES/DRESSINGS) ×2 IMPLANT
STAPLER VISISTAT 35W (STAPLE) ×2 IMPLANT
STRIP CLOSURE SKIN 1/2X4 (GAUZE/BANDAGES/DRESSINGS) ×2 IMPLANT
SUCTION FRAZIER TIP 10 FR DISP (SUCTIONS) ×2 IMPLANT
SUT MNCRL AB 4-0 PS2 18 (SUTURE) IMPLANT
SUT VIC AB 0 CT1 27 (SUTURE) ×2
SUT VIC AB 0 CT1 27XBRD ANBCTR (SUTURE) ×1 IMPLANT
SUT VIC AB 2-0 CT1 27 (SUTURE) ×2
SUT VIC AB 2-0 CT1 TAPERPNT 27 (SUTURE) ×1 IMPLANT
SUT VIC AB 3-0 SH 18 (SUTURE) ×2 IMPLANT
SYR 30ML LL (SYRINGE) ×1 IMPLANT
TOWEL OR 17X24 6PK STRL BLUE (TOWEL DISPOSABLE) ×2 IMPLANT
TOWEL OR 17X26 10 PK STRL BLUE (TOWEL DISPOSABLE) ×2 IMPLANT
TRAY FOLEY CATH 14FR (SET/KITS/TRAYS/PACK) ×1 IMPLANT
WATER STERILE IRR 1000ML POUR (IV SOLUTION) ×6 IMPLANT

## 2012-02-23 NOTE — H&P (Signed)
PREOPERATIVE H&P  Chief Complaint: DJD LEFT KNEE  HPI: Tabitha Oneal is a 56 y.o. female who presents for preoperative history and physical with a diagnosis of DJD LEFT KNEE. Symptoms are rated as moderate to severe, and have been worsening.  This is significantly impairing activities of daily living.  She has elected for surgical management. She has advanced patellofemoral disease, and has failed lateral release with arthroscopy, with grade 4 loss. She has been severely impaired, and at this point can barely walk. She uses a cane, and has a back kneed gait secondary to quad in addition due to the severe patellofemoral disease.  Past Medical History  Diagnosis Date  . Diabetes mellitus   . Hypertension   . Asthma    Past Surgical History  Procedure Date  . Joint replacement     acl   bil knees  . Abdominal hysterectomy    History   Social History  . Marital Status: Married    Spouse Name: N/A    Number of Children: N/A  . Years of Education: N/A   Social History Main Topics  . Smoking status: Never Smoker   . Smokeless tobacco: None  . Alcohol Use: No  . Drug Use: No  . Sexually Active:    Other Topics Concern  . None   Social History Narrative  . None   History reviewed. No pertinent family history. No Known Allergies Prior to Admission medications   Medication Sig Start Date End Date Taking? Authorizing Provider  albuterol (PROVENTIL HFA;VENTOLIN HFA) 108 (90 BASE) MCG/ACT inhaler Inhale 2 puffs into the lungs every 4 (four) hours as needed. For wheezing   Yes Historical Provider, MD  atorvastatin (LIPITOR) 80 MG tablet Take 80 mg by mouth daily.   Yes Historical Provider, MD  azelastine (ASTELIN) 137 MCG/SPRAY nasal spray Place 1 spray into the nose 2 (two) times daily. Use in each nostril as directed   Yes Historical Provider, MD  Azelastine HCl (ASTEPRO) 0.15 % SOLN Place 1 spray into the nose 2 (two) times daily.   Yes Historical Provider, MD    beclomethasone (QVAR) 80 MCG/ACT inhaler Inhale 2 puffs into the lungs 2 (two) times daily.   Yes Historical Provider, MD  Calcium Carbonate-Vitamin D (CALCIUM 600+D3 PO) Take 1 tablet by mouth daily.   Yes Historical Provider, MD  metFORMIN (GLUCOPHAGE) 500 MG tablet Take 500 mg by mouth daily with breakfast.   Yes Historical Provider, MD  montelukast (SINGULAIR) 10 MG tablet Take 10 mg by mouth at bedtime.   Yes Historical Provider, MD  quinapril (ACCUPRIL) 10 MG tablet Take 10 mg by mouth daily.   Yes Historical Provider, MD  sitaGLIPtin (JANUVIA) 100 MG tablet Take 100 mg by mouth daily.   Yes Historical Provider, MD     Positive ROS: All other systems have been reviewed and were otherwise negative with the exception of those mentioned in the HPI and as above.  Physical Exam: General: Alert, no acute distress Cardiovascular: No pedal edema Respiratory: No cyanosis, no use of accessory musculature GI: No organomegaly, abdomen is soft and non-tender Skin: No lesions in the area of chief complaint Neurologic: Sensation intact distally Psychiatric: Patient is competent for consent with normal mood and affect Lymphatic: No axillary or cervical lymphadenopathy  MUSCULOSKELETAL: Left knee has positive crepitance around the patellofemoral joint, with substantial quadriceps weakness and atrophy.  Assessment: DJD LEFT KNEE, primarily patellofemoral, with advanced disease  Plan: Plan for Procedure(s): TOTAL KNEE ARTHROPLASTY  The risks benefits and alternatives were discussed with the patient including but not limited to the risks of nonoperative treatment, versus surgical intervention including infection, bleeding, nerve injury,  blood clots, cardiopulmonary complications, morbidity, mortality, among others, and they were willing to proceed.   Marlinda Miranda P, MD Cell [phone] Pager [phone]  02/23/2012 1:03 PM

## 2012-02-23 NOTE — Anesthesia Postprocedure Evaluation (Signed)
  Anesthesia Post-op Note  Patient: Tabitha Oneal  Procedure(s) Performed: Procedure(s) (LRB) with comments: TOTAL KNEE ARTHROPLASTY (Left)  Patient Location: PACU  Anesthesia Type: General and GA combined with regional for post-op pain  Level of Consciousness: awake and alert   Airway and Oxygen Therapy: Patient Spontanous Breathing and Patient connected to nasal cannula oxygen  Post-op Pain: mild  Post-op Assessment: Post-op Vital signs reviewed  Post-op Vital Signs: Reviewed  Complications: No apparent anesthesia complications

## 2012-02-23 NOTE — Preoperative (Signed)
Beta Blockers   Reason not to administer Beta Blockers:Not Applicable 

## 2012-02-23 NOTE — Transfer of Care (Signed)
Immediate Anesthesia Transfer of Care Note  Patient: Tabitha Oneal  Procedure(s) Performed: Procedure(s) (LRB) with comments: TOTAL KNEE ARTHROPLASTY (Left)  Patient Location: PACU  Anesthesia Type: General  Level of Consciousness: awake, alert  and sedated  Airway & Oxygen Therapy: Patient Spontanous Breathing  Post-op Assessment: Report given to PACU RN  Post vital signs: stable  Complications: No apparent anesthesia complications

## 2012-02-23 NOTE — Anesthesia Preprocedure Evaluation (Addendum)
Anesthesia Evaluation  Patient identified by MRN, date of birth, ID band Patient awake    Reviewed: Allergy & Precautions, H&P , NPO status , Patient's Chart, lab work & pertinent test results  Airway Mallampati: II      Dental  (+) Edentulous Upper and Edentulous Lower   Pulmonary  breath sounds clear to auscultation        Cardiovascular Rhythm:Regular Rate:Normal     Neuro/Psych    GI/Hepatic   Endo/Other    Renal/GU      Musculoskeletal   Abdominal   Peds  Hematology   Anesthesia Other Findings   Reproductive/Obstetrics                           Anesthesia Physical Anesthesia Plan  ASA: III  Anesthesia Plan: General   Post-op Pain Management:    Induction: Intravenous  Airway Management Planned: Oral ETT  Additional Equipment:   Intra-op Plan:   Post-operative Plan: Extubation in OR  Informed Consent: I have reviewed the patients History and Physical, chart, labs and discussed the procedure including the risks, benefits and alternatives for the proposed anesthesia with the patient or authorized representative who has indicated his/her understanding and acceptance.     Plan Discussed with: CRNA and Surgeon  Anesthesia Plan Comments: (Type 2 DM glucose 127 Htn Asthma lungs-clear  Plan GA with oral ETT and femoral nerve block  Kipp Brood, MD )        Anesthesia Quick Evaluation

## 2012-02-23 NOTE — Progress Notes (Signed)
ANTICOAGULATION CONSULT NOTE - Initial Consult  Pharmacy Consult for Coumadin Indication: VTE prophylaxis  No Known Allergies  Patient Measurements: Wt = 96 kg  Labs: No results found for this basename: HGB:2,HCT:3,PLT:3,APTT:3,LABPROT:3,INR:3,HEPARINUNFRC:3,CREATININE:3,CKTOTAL:3,CKMB:3,TROPONINI:3 in the last 72 hours  CrCl is unknown because there is no height on file for the current visit.  Medical History: Past Medical History  Diagnosis Date  . Diabetes mellitus   . Hypertension   . Asthma   . Osteoarthritis of left knee 02/23/2012   Assessment: 56 year old to begin Coumadin for VTE prophylaxis s/p total knee  Goal of Therapy:  INR 2-3 Monitor platelets by anticoagulation protocol: Yes   Plan:  1) Coumadin 7.5 mg po daily at 1800 pm 2) Daily INR 3) Coumadin education  Thank you. Okey Regal, PharmD [phone]  02/23/2012,7:00 PM

## 2012-02-23 NOTE — Op Note (Signed)
DATE OF SURGERY:  02/23/2012 TIME: 3:32 PM  PATIENT NAME:  Tabitha Oneal   AGE: 56 y.o.    PRE-OPERATIVE DIAGNOSIS:  DJD LEFT KNEE  POST-OPERATIVE DIAGNOSIS:  Same  PROCEDURE:  Procedure(s): Left TOTAL KNEE ARTHROPLASTY   SURGEON:  Eulas Post, MD   ASSISTANT:  Janace Litten, OPA-C, present and scrubbed throughout the case, critical for assistance with exposure, retraction, instrumentation, and closure.   OPERATIVE IMPLANTS: Depuy PFC Sigma, Posterior Stabilized.  Femur size 3, Tibia size 3, Patella size 35 mm 3-peg oval button, with a 10 mm polyethylene insert.   PREOPERATIVE INDICATIONS:  Tabitha Oneal is a 56 y.o. year old female with end stage bone on bone degenerative arthritis of the knee who failed conservative treatment, including injections, antiinflammatories, activity modification, and assistive devices, and had significant impairment of their activities of daily living, and elected for Total Knee Arthroplasty. She had a previous left knee arthroscopy which demonstrated grade 4 chondral loss of the patellofemoral joint, and had a lateral release, but continued to have persistent symptoms. She is also known to have some medial compartment changes as well.  The risks, benefits, and alternatives were discussed at length including but not limited to the risks of infection, bleeding, nerve injury, stiffness, blood clots, the need for revision surgery, cardiopulmonary complications, among others, and they were willing to proceed.   OPERATIVE DESCRIPTION:  The patient was brought to the operative room and placed in a supine position.  General anesthesia was administered.  IV antibiotics were given.  The lower extremity was prepped and draped in the usual sterile fashion.  Time out was performed.  The leg was elevated and exsanguinated and the tourniquet was inflated.  Anterior quadriceps tendon splitting approach was performed.  The patella was everted and  osteophytes were removed.  The anterior horn of the medial and lateral meniscus was removed.   The distal femur was opened with the drill and the intramedullary distal femoral cutting jig was utilized, set at 5 degrees resecting 10 mm off the distal femur.  Care was taken to protect the collateral ligaments.  Then the extramedullary tibial cutting jig was utilized making the appropriate cut using the anterior tibial crest as a reference building in appropriate posterior slope.  Care was taken during the cut to protect the medial and collateral ligaments.  The proximal tibia was removed along with the posterior horns of the menisci.  The PCL was sacrificed.  My initial cut was very conservative. Also, she had minimal varus alignment, and so I only did one notch off the midline on the jig.  The extensor gap was measured and was initially tight, and so I recut the tibia, taking an additional 2 mm, and then after repeat measurement, it was approximately 10mm.    The distal femoral sizing jig was applied, taking care to avoid notching.  Then the 4-in-1 cutting jig was applied and the anterior and posterior femur was cut, along with the chamfer cuts.  The anterolateral cut however the saw blade impinge slightly on the tenaculum, removing a slight amount of deeper bone, than normal.  All posterior osteophytes were removed.  The flexion gap was then measured and was symmetric with the extension gap.  I completed the distal femoral preparation using the appropriate jig to prepare the box.  The patella was then measured, and cut with the saw.  This measured 22 mm, and then measured 14 mm after the cut.   The proximal tibia sized  and prepared accordingly with the reamer and the punch, and then all components were trialed with the 10mm poly insert.  The knee was found to have excellent balance and full motion.    The above named components were then cemented into place and all excess cement was removed.  The  trial polyethylene component was in place during cementation, and then was exchanged for the real polyethylene component.  During the placement of the tibial component, initially the bone was very soft, and rotationally it actually was internally rotated, which was corrected during final seating, however the metaphyseal bone was remarkably soft. It did not lock in the rotational control of the component adequately, however complete cementation was performed.   The knee was easily taken through a range of motion and the patella tracked well and the knee irrigated copiously and the parapatellar and subcutaneous tissue closed with vicryl, and monocryl with steri strips for the skin.  The wounds were injected with marcaine, and dressed with sterile gauze and the tourniquet released and the patient was awakened and returned to the PACU in stable and satisfactory condition.  There were no complications.  Total tourniquet time was ~80 minutes.

## 2012-02-23 NOTE — Anesthesia Procedure Notes (Addendum)
Anesthesia Regional Block:  Femoral nerve block  Pre-Anesthetic Checklist: ,, timeout performed, Correct Patient, Correct Site, Correct Laterality, Correct Procedure, Correct Position, site marked, Risks and benefits discussed,  Surgical consent,  Pre-op evaluation,  At surgeon's request and post-op pain management  Laterality: Left  Prep: chloraprep       Needles:  Injection technique: Single-shot  Needle Type: Echogenic Stimulator Needle      Needle Gauge: 22 and 22 G    Additional Needles:  Procedures: ultrasound guided and nerve stimulator Femoral nerve block Narrative:  Start time: 02/23/2012 12:30 PM End time: 02/23/2012 12:40 PM  Performed by: Personally   Additional Notes: 30 cc 0.5% Marcaine with 1:200 Epi injected easily  Kipp Brood, MD   Procedure Name: LMA Insertion Date/Time: 02/23/2012 1:51 PM Performed by: Rossie Muskrat L Pre-anesthesia Checklist: Patient identified, Timeout performed, Emergency Drugs available, Suction available and Patient being monitored Patient Re-evaluated:Patient Re-evaluated prior to inductionOxygen Delivery Method: Circle system utilized Preoxygenation: Pre-oxygenation with 100% oxygen Intubation Type: IV induction Ventilation: Mask ventilation without difficulty LMA: LMA inserted LMA Size: 4.0 Tube type: Oral Number of attempts: 1 Placement Confirmation: breath sounds checked- equal and bilateral and positive ETCO2 Tube secured with: Tape Dental Injury: Teeth and Oropharynx as per pre-operative assessment

## 2012-02-24 ENCOUNTER — Encounter (HOSPITAL_COMMUNITY): Payer: Self-pay | Admitting: Orthopedic Surgery

## 2012-02-24 LAB — BASIC METABOLIC PANEL
BUN: 7 mg/dL (ref 6–23)
Chloride: 101 mEq/L (ref 96–112)
GFR calc Af Amer: >90 mL/min (ref >90)
GFR calc non Af Amer: 78 mL/min — ABNORMAL LOW (ref >90)
Potassium: 3.8 mEq/L (ref 3.5–5.1)
Sodium: 138 mEq/L (ref 135–145)

## 2012-02-24 LAB — GLUCOSE, CAPILLARY
Glucose-Capillary: 175 mg/dL — ABNORMAL HIGH (ref 70–99)
Glucose-Capillary: 139 mg/dL — ABNORMAL HIGH (ref 70–99)
Glucose-Capillary: 133 mg/dL — ABNORMAL HIGH (ref 70–99)
Glucose-Capillary: 115 mg/dL — ABNORMAL HIGH (ref 70–99)

## 2012-02-24 LAB — CBC
HCT: 26.4 % — ABNORMAL LOW (ref 36.0–46.0)
Hemoglobin: 8.5 g/dL — ABNORMAL LOW (ref 12.0–15.0)
MCHC: 32.2 g/dL (ref 30.0–36.0)
RDW: 13.6 % (ref 11.5–15.5)
WBC: 7.8 10*3/uL (ref 4.0–10.5)

## 2012-02-24 NOTE — Progress Notes (Signed)
ANTICOAGULATION CONSULT - COUMADIN  Pharmacy Consult for Coumadin  HPI: 56 y.o.female admitted for DJD LEFT KNEE who is currently on Coumadin for VTE prophylaxis following L-TKA.  Allergies: No Known Allergies  Height/Weight: Height: 5' 2.99" (160 cm) Weight: 211 lb 3.2 oz (95.8 kg) IBW/kg (Calculated) : 52.38   Vitals: Blood pressure 122/60, pulse 95, temperature 99.4 F (37.4 C), temperature source Oral, resp. rate 16, height 5' 2.99" (1.6 m), weight 211 lb 3.2 oz (95.8 kg), SpO2 96.00%.  Current active problems: Principal Problem:  *Osteoarthritis of left knee  Medical / Surgical History: Past Medical History  Diagnosis Date  . Diabetes mellitus   . Hypertension   . Asthma   . Osteoarthritis of left knee 02/23/2012   Past Surgical History  Procedure Date  . Joint replacement     acl   bil knees  . Abdominal hysterectomy    Current Labs:  [facility] 02/24/12 0605  HGB 8.5*  HCT 26.4*  PLT 260  LABPROT 14.1  INR 1.10  CREATININE 0.82   Lab Results  Component Value Date   INR 1.10 02/24/2012   INR 1.02 02/09/2012   Estimated Creatinine Clearance: 83.4 ml/min (by C-G formula based on Cr of 0.82).  Pertinent Medication History: Prescriptions prior to admission  Medication Sig Dispense Refill  . albuterol (PROVENTIL HFA;VENTOLIN HFA) 108 (90 BASE) MCG/ACT inhaler Inhale 2 puffs into the lungs every 4 (four) hours as needed. For wheezing      . atorvastatin (LIPITOR) 80 MG tablet Take 80 mg by mouth daily.      Marland Kitchen azelastine (ASTELIN) 137 MCG/SPRAY nasal spray Place 1 spray into the nose 2 (two) times daily. Use in each nostril as directed      . beclomethasone (QVAR) 80 MCG/ACT inhaler Inhale 2 puffs into the lungs 2 (two) times daily.      . Calcium Carbonate-Vitamin D (CALCIUM 600+D3 PO) Take 1 tablet by mouth daily.      . metFORMIN (GLUCOPHAGE) 500 MG tablet Take 500 mg by mouth daily with breakfast.      . montelukast (SINGULAIR) 10 MG tablet Take 10 mg by  mouth at bedtime.      . quinapril (ACCUPRIL) 10 MG tablet Take 10 mg by mouth daily.      . sitaGLIPtin (JANUVIA) 100 MG tablet Take 100 mg by mouth daily.       Scheduled:    . acetaminophen      . acetaminophen  1,000 mg Intravenous Q6H  . atorvastatin  80 mg Oral q1800  . azelastine  1 spray Each Nare BID  . docusate sodium  100 mg Oral BID  . LOVENOX injection  30 mg Subcutaneous Q12H  . fluticasone  2 puff Inhalation BID  . insulin aspart  0-15 Units Subcutaneous TID WC  . linagliptin  5 mg Oral Daily  . lisinopril  10 mg Oral Daily  . montelukast  10 mg Oral QHS  . patient's guide to using coumadin book   Does not apply Once  . senna  1 tablet Oral BID  . warfarin  7.5 mg Oral q1800  . DISCONTD:  ceFAZolin (ANCEF) IV  2 g Intravenous Q6H    Assessment:  INR 1.1  today following initial dose of Coumadin.  No complications noted.  Goals:  Target INR of 2-3.  Plan:  Will continue Coumadin 7.5 mg daily as ordered.  Continue Lovenox 30 mg sq q 12 hours.    Daily INR's, CBC. Warf  ED initiated   Laurena Bering, Pharm. D. 02/24/2012, 9:38 AM

## 2012-02-24 NOTE — Progress Notes (Signed)
CARE MANAGEMENT NOTE 02/24/2012  Patient:  Tabitha Oneal, Tabitha Oneal   Account Number:  [id number]  Date Initiated:  02/24/2012  Documentation initiated by:  Vance Peper  Subjective/Objective Assessment:   56 yr old female s/p left total knee arthroplasty     Action/Plan:   CM spoke with patient regarding home health and DME needs at discharged. Choice offered. Rolling walker, 3in1 and CPM have been delivered to patient's home. Patient has family supposrt at discharge.   Anticipated DC Date:  02/25/2012   Anticipated DC Plan:  HOME W HOME HEALTH SERVICES      DC Planning Services  CM consult      [facility] Choice  HOME HEALTH   Choice offered to / List presented to:  C-1 Patient        HH arranged  HH-1 RN  HH-2 PT      [facility] agency  [facility]   Status of service:  Completed, signed off Medicare Important Message given?   (If response is "NO", the following Medicare IM given date fields will be blank) Date Medicare IM given:   Date Additional Medicare IM given:    Discharge Disposition:  HOME W HOME HEALTH SERVICES  Per UR Regulation:    If discussed at Long Length of Stay Meetings, dates discussed:    Comments:

## 2012-02-24 NOTE — Progress Notes (Signed)
Physical Therapy Evaluation Patient Details Name: Tabitha Oneal MRN: [id number] DOB: [birth year] Today's Date: 02/24/2012 Time: 0818-0901 PT Time Calculation (min): 43 min  PT Assessment / Plan / Recommendation Clinical Impression  Pt is a 56 y.o. F s/p L TKA.  Pt expressed feeling numbness in L LE digits and PA was informed.  Pt will benefit from acute physical therapy to increase independence, strength, ROM, safety, and endurance    PT Assessment  Patient needs continued PT services    Follow Up Recommendations  Home health PT    Barriers to Discharge None      Equipment Recommendations  None recommended by PT    Recommendations for Other Services Other (comment) (None)   Frequency 7X/week    Precautions / Restrictions Precautions Precautions: Knee Restrictions Weight Bearing Restrictions: Yes LLE Weight Bearing: Weight bearing as tolerated   Pertinent Vitals/Pain 9/10 pain in L LE after ambulation      Mobility  Bed Mobility Bed Mobility: Supine to Sit;Sitting - Scoot to Edge of Bed Supine to Sit: 4: Min assist Sitting - Scoot to [place] of Bed: 4: Min assist Details for Bed Mobility Assistance: (A) with lowering L LE OOB.  Cues for proper technique and hand placement Transfers Transfers: Sit to Stand;Stand to Sit Sit to Stand: 2: Max assist Stand to Sit: 3: Mod assist Details for Transfer Assistance: (A) with initiating mvt and slowing descent.  Cues for proper technique, hand placement, and safety Ambulation/Gait Ambulation/Gait Assistance: 4: Min assist Ambulation Distance (Feet): 15 Feet Assistive device: Rolling walker Ambulation/Gait Assistance Details: (A) with RW placement and balance.  Cues for proper technique, posture, and safety Gait Pattern: Step-to pattern;Decreased stride length;Antalgic Gait velocity: decreased Stairs: No Wheelchair Mobility Wheelchair Mobility: No    Shoulder Instructions     Exercises Total Joint Exercises Ankle  Circles/Pumps: AROM;Both;10 reps Quad Sets: AROM;Left;5 reps Heel Slides: AAROM;Left;5 reps Goniometric ROM: 25-58   PT Diagnosis: Difficulty walking;Abnormality of gait;Generalized weakness;Acute pain  PT Problem List: Decreased strength;Decreased range of motion;Decreased activity tolerance;Decreased balance;Decreased mobility;Pain;Decreased knowledge of use of DME PT Treatment Interventions: Therapeutic exercise;DME instruction;Gait training;Stair training;Functional mobility training;Therapeutic activities;Balance training;Neuromuscular re-education   PT Goals Acute Rehab PT Goals PT Goal Formulation: With patient Time For Goal Achievement: 03/02/12 Potential to Achieve Goals: Good Pt will go Supine/Side to Sit: with modified independence;with HOB 0 degrees PT Goal: Supine/Side to Sit - Progress: Goal set today Pt will go Sit to Supine/Side: with modified independence;with HOB 0 degrees PT Goal: Sit to Supine/Side - Progress: Goal set today Pt will go Sit to Stand: with modified independence;with upper extremity assist PT Goal: Sit to Stand - Progress: Goal set today Pt will go Stand to Sit: with modified independence;with upper extremity assist PT Goal: Stand to Sit - Progress: Goal set today Pt will Ambulate: 51 - 150 feet;with supervision;with least restrictive assistive device PT Goal: Ambulate - Progress: Goal set today Pt will Go Up / Down Stairs: 3-5 stairs;with min assist;with least restrictive assistive device PT Goal: Up/Down Stairs - Progress: Goal set today Pt will Perform Home Exercise Program: Independently PT Goal: Perform Home Exercise Program - Progress: Goal set today  Visit Information  Last PT Received On: 02/24/12 Assistance Needed: +1    Subjective Data  Subjective: Do I have to walk today? Patient Stated Goal: To go home   Prior Functioning  Home Living Lives With: Spouse Available Help at Discharge: Family Type of Home: House Home Access: Stairs  to enter Entrance  Stairs-Number of Steps: 4 Entrance Stairs-Rails: Right Home Layout: One level Bathroom Shower/Tub: Forensic scientist: Standard Bathroom Accessibility: Yes How Accessible: Accessible via walker Home Adaptive Equipment: Bedside commode/3-in-1;Quad cane;Straight cane;Walker - rolling Prior Function Level of Independence: Independent with assistive device(s) Able to Take Stairs?: Yes Driving: Yes Vocation: On disability Communication Communication: No difficulties Dominant Hand: Left    Cognition  Overall Cognitive Status: Appears within functional limits for tasks assessed/performed Arousal/Alertness: Awake/alert Orientation Level: Appears intact for tasks assessed Behavior During Session: [facility] for tasks performed    Extremity/Trunk Assessment Left Lower Extremity Assessment LLE Sensation: Deficits LLE Sensation Deficits: Pt states numbness in toes on L LE   Balance Balance Balance Assessed: No  End of Session PT - End of Session Equipment Utilized During Treatment: Gait belt Activity Tolerance: Patient tolerated treatment well Patient left: in chair;with call bell/phone within reach;with family/visitor present Nurse Communication: Mobility status  GP     DITOMMASO, AMY 02/24/2012, 9:22 AM  Jake Shark, PT DPT [phone]

## 2012-02-24 NOTE — Progress Notes (Signed)
Patient ID: Tabitha Oneal, female   DOB: 1955/09/08, 56 y.o.   MRN: [id number]     Subjective:  Patient reports pain as mild to moderate.  She states that she is very tired after just finishing PT.  Objective:   VITALS:   Filed Vitals:   02/24/12 0400 02/24/12 0500 02/24/12 0918 02/24/12 0920  BP:  122/60    Pulse:  95    Temp:  99.4 F (37.4 C)    TempSrc:      Resp: 18 16    Height:    5' 2.99" (1.6 m)  Weight:    95.8 kg (211 lb 3.2 oz)  SpO2: 97% 96% 96%     ABD soft Sensation intact distally Dorsiflexion/Plantar flexion intact Incision: dressing C/D/I and scant drainage  LABS  Results for orders placed during the hospital encounter of 02/23/12 (from the past 24 hour(s))  GLUCOSE, CAPILLARY     Status: Abnormal   Collection Time   02/23/12 11:40 AM      Component Value Range   Glucose-Capillary 127 (*) 70 - 99 mg/dL  GLUCOSE, CAPILLARY     Status: Abnormal   Collection Time   02/23/12  4:30 PM      Component Value Range   Glucose-Capillary 125 (*) 70 - 99 mg/dL  GLUCOSE, CAPILLARY     Status: Abnormal   Collection Time   02/23/12  9:24 PM      Component Value Range   Glucose-Capillary 175 (*) 70 - 99 mg/dL  PROTIME-INR     Status: Normal   Collection Time   02/24/12  6:05 AM      Component Value Range   Prothrombin Time 14.1  11.6 - 15.2 seconds   INR 1.10  0.00 - 1.49  CBC     Status: Abnormal   Collection Time   02/24/12  6:05 AM      Component Value Range   WBC 7.8  4.0 - 10.5 K/uL   RBC 3.32 (*) 3.87 - 5.11 MIL/uL   Hemoglobin 8.5 (*) 12.0 - 15.0 g/dL   HCT 40.9 (*) 81.1 - 91.4 %   MCV 79.5  78.0 - 100.0 fL   MCH 25.6 (*) 26.0 - 34.0 pg   MCHC 32.2  30.0 - 36.0 g/dL   RDW 78.2  95.6 - 21.3 %   Platelets 260  150 - 400 K/uL  BASIC METABOLIC PANEL     Status: Abnormal   Collection Time   02/24/12  6:05 AM      Component Value Range   Sodium 138  135 - 145 mEq/L   Potassium 3.8  3.5 - 5.1 mEq/L   Chloride 101  96 - 112 mEq/L   CO2 27  19 -  32 mEq/L   Glucose, Bld 146 (*) 70 - 99 mg/dL   BUN 7  6 - 23 mg/dL   Creatinine, Ser 0.86  0.50 - 1.10 mg/dL   Calcium 9.0  8.4 - 57.8 mg/dL   GFR calc non Af Amer 78 (*) >90 mL/min   GFR calc Af Amer >90  >90 mL/min  GLUCOSE, CAPILLARY     Status: Abnormal   Collection Time   02/24/12  6:51 AM      Component Value Range   Glucose-Capillary 139 (*) 70 - 99 mg/dL    X-ray Knee Left Port  02/23/2012  *RADIOLOGY REPORT*  Clinical Data: Status post left knee arthroplasty.  PORTABLE LEFT KNEE - 1-2 VIEW  Comparison: None.  Findings: Two views obtained postoperatively show normal alignment following total knee arthroplasty.  No significant abnormalities are identified.  There is some gas in the soft tissues anteriorly.  IMPRESSION: Normal alignment status post left knee arthroplasty.   Original Report Authenticated By: Reola Calkins, M.D.     Assessment/Plan: 1 Day Post-Op   Principal Problem:  *Osteoarthritis of left knee   Advance diet Up with therapy Continue PT  Will plan Dressing change tomorrow Will plan for DC later in week. ABLA will continue to monitor  Markitta Ausburn P 02/24/2012, 10:03 AM   Teryl Lucy, MD Cell [phone] Pager [phone]

## 2012-02-24 NOTE — Progress Notes (Signed)
Physical Therapy Progress Note   02/24/12 1600  PT Visit Information  Last PT Received On 02/24/12  Assistance Needed +1  PT Time Calculation  PT Start Time 1500  PT Stop Time 1526  PT Time Calculation (min) 26 min  Subjective Data  Subjective Going to do the same thing as this morning  Patient Stated Goal To go home  Precautions  Precautions Knee  Restrictions  Weight Bearing Restrictions Yes  LLE Weight Bearing WBAT  Cognition  Overall Cognitive Status Appears within functional limits for tasks assessed/performed  Arousal/Alertness Awake/alert  Orientation Level Appears intact for tasks assessed  Behavior During Session [facility] for tasks performed  Bed Mobility  Bed Mobility Supine to Sit;Sitting - Scoot to [place] of Bed;Sit to Supine  Supine to Sit 4: Min assist;HOB flat  Sitting - Scoot to [place] of Bed 4: Min assist  Sit to Supine 4: Min assist;HOB flat  Details for Bed Mobility Assistance (A) to support L LE during bed mobility.  Cues for hand placement and proper technique  Transfers  Transfers Sit to Stand;Stand to Sit  Sit to Stand 4: Min assist;With upper extremity assist;From bed;From toilet  Stand to Sit 4: Min assist;With upper extremity assist;To bed;To toilet  Details for Transfer Assistance (A) with initiating transfers and descent.  Cues for proper technique, hand placement, and L LE placement.  Ambulation/Gait  Ambulation/Gait Assistance 4: Min assist  Ambulation Distance (Feet) 55 Feet  Assistive device Rolling walker  Ambulation/Gait Assistance Details (A) with RW placement and balance.  Cues for proper technique, safety, and posture  Gait Pattern Step-to pattern;Decreased stride length;Antalgic  Gait velocity decreased  Stairs No  Balance  Balance Assessed No  PT - End of Session  Equipment Utilized During Treatment Gait belt  Activity Tolerance Patient tolerated treatment well  Patient left in bed;with call bell/phone within reach;with family/visitor  present  Nurse Communication Mobility status  PT - Assessment/Plan  Comments on Treatment Session Pt motivated to ambulate and able to increase distance.  Pt had increased pain and requested pain medication.  Nurse was informed.  Pt left on CPM 0-40  PT Plan Discharge plan remains appropriate;Frequency remains appropriate  PT Frequency 7X/week  Recommendations for Other Services Other (comment) (None)  Follow Up Recommendations Home health PT  Equipment Recommended None recommended by PT  Acute Rehab PT Goals  PT Goal Formulation With patient  Time For Goal Achievement 03/02/12  Potential to Achieve Goals Good  Pt will go Supine/Side to Sit with modified independence;with HOB 0 degrees  PT Goal: Supine/Side to Sit - Progress Progressing toward goal  Pt will go Sit to Supine/Side with modified independence;with HOB 0 degrees  PT Goal: Sit to Supine/Side - Progress Progressing toward goal  Pt will go Sit to Stand with modified independence;with upper extremity assist  PT Goal: Sit to Stand - Progress Progressing toward goal  Pt will go Stand to Sit with modified independence;with upper extremity assist  PT Goal: Stand to Sit - Progress Progressing toward goal  Pt will Ambulate 51 - 150 feet;with supervision;with least restrictive assistive device  PT Goal: Ambulate - Progress Partly met    Pt reports pain 10/10 in L LE.  Amy DiTommaso, SPT [place], [place] DPT [phone]

## 2012-02-24 NOTE — Progress Notes (Signed)
UR COMPLETED  

## 2012-02-24 NOTE — Addendum Note (Signed)
Addendum  created 02/24/12 1410 by Faraz Ponciano S Olinda Nola, CRNA   Modules edited:Anesthesia Medication Administration    

## 2012-02-24 NOTE — Evaluation (Signed)
Occupational Therapy Evaluation Patient Details Name: Tabitha Oneal MRN: [id number] DOB: 04/11/1956 Today's Date: 02/24/2012 Time: 4098-1191 OT Time Calculation (min): 25 min  OT Assessment / Plan / Recommendation Clinical Impression  56 yo female s/p Lt TKA that could benefit from skilled OT acutely. Recommend HHOT for d/c planning    OT Assessment  Patient needs continued OT Services    Follow Up Recommendations  Home health OT    Barriers to Discharge      Equipment Recommendations  None recommended by OT (has all neccessary DME)    Recommendations for Other Services    Frequency  Min 2X/week    Precautions / Restrictions Precautions Precautions: Knee Restrictions Weight Bearing Restrictions: Yes LLE Weight Bearing: Weight bearing as tolerated   Pertinent Vitals/Pain none    ADL  Eating/Feeding: Performed;Modified independent Where Assessed - Eating/Feeding: Chair Grooming: Performed;Wash/dry hands;Supervision/safety Where Assessed - Grooming: Unsupported standing Toilet Transfer: Performed;Moderate assistance Toilet Transfer Method: Sit to stand Toilet Transfer Equipment: Raised toilet seat with arms (or 3-in-1 over toilet) Toileting - Clothing Manipulation and Hygiene: Performed;Minimal assistance Where Assessed - Toileting Clothing Manipulation and Hygiene: Sit to stand from 3-in-1 or toilet Equipment Used: Gait belt;Rolling walker Transfers/Ambulation Related to ADLs: Pt ambulated to restroom with Mod v/c (pt repeating out loud sequence) and min (A) . pt progressing well. ADL Comments: pt educated on bed mobility and exiting the CPM machine. Pt reports "that wasnt bad". pt reports this morning was rough. Pt with decr pain this afternoon and progressing compared to transfers in the PT evaluation.    OT Diagnosis: Generalized weakness  OT Problem List: Decreased strength;Decreased activity tolerance;Impaired balance (sitting and/or standing);Decreased  knowledge of use of DME or AE;Decreased safety awareness;Decreased knowledge of precautions;Pain OT Treatment Interventions: Self-care/ADL training;Therapeutic exercise;DME and/or AE instruction;Therapeutic activities;Patient/family education;Balance training   OT Goals Acute Rehab OT Goals OT Goal Formulation: With patient Time For Goal Achievement: 03/09/12 Potential to Achieve Goals: Good ADL Goals Pt Will Perform Lower Body Bathing: with set-up;with adaptive equipment;Sit to stand from chair ADL Goal: Lower Body Bathing - Progress: Goal set today Pt Will Perform Lower Body Dressing: with set-up;Sit to stand from chair;with adaptive equipment ADL Goal: Lower Body Dressing - Progress: Goal set today Pt Will Transfer to Toilet: with modified independence;Ambulation;with DME;3-in-1 ADL Goal: Toilet Transfer - Progress: Goal set today Pt Will Perform Tub/Shower Transfer: Tub transfer;with set-up;Ambulation ADL Goal: Tub/Shower Transfer - Progress: Goal set today Miscellaneous OT Goals Miscellaneous OT Goal #1: Pt will complete bed mobility HOB flat no rails as precursor for adls OT Goal: Miscellaneous Goal #1 - Progress: Goal set today  Visit Information  Last OT Received On: 02/24/12 Assistance Needed: +1    Subjective Data  Subjective: "can you help me to the bathroom after he finishes?"- pt requesting restroom s/p RN assessment Patient Stated Goal: to go home with daughter and husband (A)   Prior Functioning     Home Living Lives With: Spouse Available Help at Discharge: Family Type of Home: House Home Access: Stairs to enter Secretary/administrator of Steps: 4 Entrance Stairs-Rails: Right Home Layout: One level Bathroom Shower/Tub: Forensic scientist: Standard Bathroom Accessibility: Yes How Accessible: Accessible via walker Home Adaptive Equipment: Bedside commode/3-in-1;Quad cane;Straight cane;Walker - rolling Prior Function Level of  Independence: Independent with assistive device(s) Able to Take Stairs?: Yes Driving: Yes Vocation: On disability Communication Communication: No difficulties Dominant Hand: Left         Vision/Perception     Cognition  Overall Cognitive Status: Appears within functional limits for tasks assessed/performed Arousal/Alertness: Awake/alert Orientation Level: Appears intact for tasks assessed Behavior During Session: [facility] for tasks performed    Extremity/Trunk Assessment Right Upper Extremity Assessment RUE ROM/Strength/Tone: Within functional levels Left Upper Extremity Assessment LUE ROM/Strength/Tone: Within functional levels Trunk Assessment Trunk Assessment: Normal     Mobility Bed Mobility Supine to Sit: 4: Min assist;HOB flat;With rails Sitting - Scoot to Edge of Bed: 4: Min assist;With rail Details for Bed Mobility Assistance: mod v/c for sequence and hand placement. Pt following commands well and repeating information back to therapist Transfers Sit to Stand: 4: Min assist;With upper extremity assist;From bed Stand to Sit: 4: Min assist;With upper extremity assist;To chair/3-in-1 (Lt LE supported) Details for Transfer Assistance: (A) with Lt LE and using UE to slowly descent      Shoulder Instructions     Exercise     Balance     End of Session OT - End of Session Activity Tolerance: Patient tolerated treatment well Patient left: with call bell/phone within reach;in chair Nurse Communication: Mobility status;Precautions (requesting Female for all hygiene)  GO     Harrel Carina [facility] 02/24/2012, 12:16 PM Pager: [phone]

## 2012-02-24 NOTE — Addendum Note (Signed)
Addendum  created 02/24/12 1410 by Marena Chancy, CRNA   Modules edited:Anesthesia Medication Administration

## 2012-02-25 LAB — CBC
HCT: 24.9 % — ABNORMAL LOW (ref 36.0–46.0)
Hemoglobin: 8 g/dL — ABNORMAL LOW (ref 12.0–15.0)
MCH: 25.3 pg — ABNORMAL LOW (ref 26.0–34.0)
MCHC: 32.1 g/dL (ref 30.0–36.0)
MCV: 78.8 fL (ref 78.0–100.0)
RDW: 13.7 % (ref 11.5–15.5)

## 2012-02-25 MED ORDER — WARFARIN SODIUM 7.5 MG PO TABS
7.5000 mg | ORAL_TABLET | Freq: Once | ORAL | Status: AC
  Administered 2012-02-25: 7.5 mg via ORAL
  Filled 2012-02-25: qty 1

## 2012-02-25 NOTE — Progress Notes (Signed)
Patient ID: Tabitha Oneal, female   DOB: [birth year], 56 y.o.   MRN: [id number]     Subjective:  Patient reports pain as mild to moderate.  Patient states that PT is hard but she is able to do it and would like to go home, just not today.  Objective:   VITALS:   Filed Vitals:   02/24/12 2324 02/25/12 0146 02/25/12 0400 02/25/12 0501  BP:  119/59  142/72  Pulse:  115  115  Temp:  99.1 F (37.3 C)  99.1 F (37.3 C)  TempSrc:  Oral  Oral  Resp: 17 18 17 18   Height:      Weight:      SpO2: 97% 93% 92% 92%    ABD soft Sensation intact distally Dorsiflexion/Plantar flexion intact Incision: dressing C/D/I and no drainage  LABS  Results for orders placed during the hospital encounter of 02/23/12 (from the past 24 hour(s))  GLUCOSE, CAPILLARY     Status: Abnormal   Collection Time   02/24/12 11:22 AM      Component Value Range   Glucose-Capillary 133 (*) 70 - 99 mg/dL   Comment 1 Documented in Chart     Comment 2 Notify RN    GLUCOSE, CAPILLARY     Status: Abnormal   Collection Time   02/24/12  4:17 PM      Component Value Range   Glucose-Capillary 115 (*) 70 - 99 mg/dL   Comment 1 Documented in Chart     Comment 2 Notify RN    GLUCOSE, CAPILLARY     Status: Abnormal   Collection Time   02/25/12 12:08 AM      Component Value Range   Glucose-Capillary 197 (*) 70 - 99 mg/dL  PROTIME-INR     Status: Abnormal   Collection Time   02/25/12  6:32 AM      Component Value Range   Prothrombin Time 16.6 (*) 11.6 - 15.2 seconds   INR 1.38  0.00 - 1.49  CBC     Status: Abnormal   Collection Time   02/25/12  6:32 AM      Component Value Range   WBC 10.7 (*) 4.0 - 10.5 K/uL   RBC 3.16 (*) 3.87 - 5.11 MIL/uL   Hemoglobin 8.0 (*) 12.0 - 15.0 g/dL   HCT 40.9 (*) 81.1 - 91.4 %   MCV 78.8  78.0 - 100.0 fL   MCH 25.3 (*) 26.0 - 34.0 pg   MCHC 32.1  30.0 - 36.0 g/dL   RDW 78.2  95.6 - 21.3 %   Platelets 215  150 - 400 K/uL  GLUCOSE, CAPILLARY     Status: Abnormal   Collection  Time   02/25/12  7:12 AM      Component Value Range   Glucose-Capillary 149 (*) 70 - 99 mg/dL    X-ray Knee Left Port  02/23/2012  *RADIOLOGY REPORT*  Clinical Data: Status post left knee arthroplasty.  PORTABLE LEFT KNEE - 1-2 VIEW  Comparison: None.  Findings: Two views obtained postoperatively show normal alignment following total knee arthroplasty.  No significant abnormalities are identified.  There is some gas in the soft tissues anteriorly.  IMPRESSION: Normal alignment status post left knee arthroplasty.   Original Report Authenticated By: Reola Calkins, M.D.     Assessment/Plan: 2 Days Post-Op   Principal Problem:  *Osteoarthritis of left knee   Advance diet Up with therapy Plan for discharge tomorrow Talked with family and patient about  home Vs. SNF and patient would still like to go home if she is able ABLA, expected, stabilizing, observe.   Macky Galik P 02/25/2012, 8:20 AM   Teryl Lucy, MD Cell [phone] Pager [phone]

## 2012-02-25 NOTE — Progress Notes (Signed)
Orthopedic Tech Progress Note Patient Details:  Tabitha Oneal [birth year] [id number]  Ortho Devices Type of Ortho Device: Knee Immobilizer Ortho Device/Splint Location: left knee immobilizer Ortho Device/Splint Interventions: Application   Shawnie Pons 02/25/2012, 10:08 AM

## 2012-02-25 NOTE — Progress Notes (Signed)
ANTICOAGULATION CONSULT - COUMADIN  Pharmacy Consult for Coumadin  HPI: 56 y.o.female admitted for DJD LEFT KNEE who is currently on Coumadin for VTE prophylaxis following L-TKA.  Allergies: No Known Allergies  Height/Weight: Height: 5' 2.99" (160 cm) Weight: 211 lb 3.2 oz (95.8 kg) IBW/kg (Calculated) : 52.38   Vitals: Blood pressure 142/72, pulse 115, temperature 99.1 F (37.3 C), temperature source Oral, resp. rate 18, height 5' 2.99" (1.6 m), weight 211 lb 3.2 oz (95.8 kg), SpO2 94.00%.  Current active problems: Principal Problem:  *Osteoarthritis of left knee  Medical / Surgical History: Diagnosis  . Diabetes mellitus  . Hypertension  . Asthma  . Osteoarthritis of left knee   Past Surgical History  Procedure Date  . Joint replacement     acl   bil knees  . Abdominal hysterectomy   . Total knee arthroplasty 02/23/2012    Procedure: TOTAL KNEE ARTHROPLASTY;  Laterality: Left;    Current Labs:  [facility] 02/25/12 0632 02/24/12 0605  HGB 8.0* 8.5*  HCT 24.9* 26.4*  PLT 215 260  LABPROT 16.6* 14.1  INR 1.38 1.10  CREATININE -- 0.82   Lab Results  Component Value Date   INR 1.38 02/25/2012   INR 1.10 02/24/2012   INR 1.02 02/09/2012   Estimated Creatinine Clearance: 83.4 ml/min (by C-G formula based on Cr of 0.82).  Pertinent Medication History: Scheduled:    . acetaminophen  1,000 mg Intravenous Q6H  . atorvastatin  80 mg Oral q1800  . azelastine  1 spray Each Nare BID  . docusate sodium  100 mg Oral BID  . enoxaparin (LOVENOX) injection  30 mg Subcutaneous Q12H  . fluticasone  2 puff Inhalation BID  . insulin aspart  0-15 Units Subcutaneous TID WC  . linagliptin  5 mg Oral Daily  . lisinopril  10 mg Oral Daily  . montelukast  10 mg Oral QHS  . senna  1 tablet Oral BID  . warfarin  7.5 mg Oral q1800   Assessment:  INR trending up 1.38.  No acute bleeding complications..  Goals:  Target INR of 2-3.  Plan:  Will repeat Coumadin 7.5 mg  today.    Daily INR's, CBC. Coumadin discharge education completed.  Dom Haverland, Elisha Headland, Pharm. D. 02/25/2012, 11:35 AM

## 2012-02-25 NOTE — Progress Notes (Signed)
Occupational Therapy Treatment Patient Details Name: Tabitha Oneal MRN: [id number] DOB: Jun 27, 1955 Today's Date: 02/25/2012 Time: 4098-1191 OT Time Calculation (min): 36 min  OT Assessment / Plan / Recommendation Comments on Treatment Session Pt is adequate level for d/c from OT stand point. Pt requesting home health to adddress tub transfer    Follow Up Recommendations  Home health OT    Barriers to Discharge       Equipment Recommendations  None recommended by OT    Recommendations for Other Services    Frequency Min 2X/week   Plan Discharge plan remains appropriate    Precautions / Restrictions Precautions Precautions: Knee Required Braces or Orthoses: Knee Immobilizer - Left Restrictions Weight Bearing Restrictions: Yes LLE Weight Bearing: Weight bearing as tolerated   Pertinent Vitals/Pain None reported    ADL  Lower Body Dressing: Performed;Minimal assistance Where Assessed - Lower Body Dressing: Unsupported sit to stand Toilet Transfer: Simulated;Supervision/safety Toilet Transfer Method: Sit to stand Toilet Transfer Equipment: Raised toilet seat with arms (or 3-in-1 over toilet) Equipment Used: Rolling walker Transfers/Ambulation Related to ADLs: Pt completed   ADL Comments: pt husband and daughter educated on dressing LB , Ki application correctly, pt don KI with Min (A) due to first attempt, risk of falling with slippers, and avoid knee flexion with sitting in recliner. Pt and husband educated on elevating surface of chair for sit<>Stand at home. Pt return demo and verbalized with teach back    OT Diagnosis:    OT Problem List:   OT Treatment Interventions:     OT Goals Acute Rehab OT Goals OT Goal Formulation: With patient Time For Goal Achievement: 03/09/12 Potential to Achieve Goals: Good ADL Goals Pt Will Perform Lower Body Bathing: with set-up;with adaptive equipment;Sit to stand from chair ADL Goal: Lower Body Bathing - Progress: Met Pt Will  Perform Lower Body Dressing: with set-up;Sit to stand from chair;with adaptive equipment ADL Goal: Lower Body Dressing - Progress: Met Pt Will Transfer to Toilet: with modified independence;Ambulation;with DME;3-in-1 ADL Goal: Toilet Transfer - Progress: Progressing toward goals Pt Will Perform Tub/Shower Transfer: Tub transfer;with set-up;Ambulation Miscellaneous OT Goals Miscellaneous OT Goal #1: Pt will complete bed mobility HOB flat no rails as precursor for adls  Visit Information  Last OT Received On: 02/25/12 Assistance Needed: +1    Subjective Data      Prior Functioning       Cognition  Overall Cognitive Status: Appears within functional limits for tasks assessed/performed Arousal/Alertness: Awake/alert Orientation Level: Appears intact for tasks assessed Behavior During Session: [facility] for tasks performed    Mobility  Shoulder Instructions Bed Mobility Bed Mobility: Not assessed Transfers Sit to Stand: 5: Supervision;With upper extremity assist;From chair/3-in-1 Stand to Sit: 5: Supervision;With upper extremity assist;To chair/3-in-1 Details for Transfer Assistance: pt progressing well       Exercises  Total Joint Exercises Ankle Circles/Pumps: AROM;Both;10 reps Quad Sets: AROM;Left;5 reps Heel Slides: AAROM;Left;5 reps   Balance Balance Balance Assessed: No   End of Session    GO     Lucile Shutters 02/25/2012, 2:26 PM Pager: [phone]

## 2012-02-25 NOTE — Progress Notes (Signed)
Physical Therapy Progress Note   02/25/12 1500  PT Visit Information  Last PT Received On 02/25/12  Assistance Needed +1  PT Time Calculation  PT Start Time 1353  PT Stop Time 1426  PT Time Calculation (min) 33 min  Subjective Data  Subjective Is it time for me to get up  Patient Stated Goal to go home  Precautions  Precautions Knee  Restrictions  Weight Bearing Restrictions Yes  LLE Weight Bearing WBAT  Cognition  Overall Cognitive Status Appears within functional limits for tasks assessed/performed  Arousal/Alertness Awake/alert  Orientation Level Appears intact for tasks assessed  Behavior During Session [facility] for tasks performed  Bed Mobility  Bed Mobility Supine to Sit;Sitting - Scoot to [place] of Bed;Sit to Supine  Supine to Sit 4: Min guard;HOB flat  Sitting - Scoot to [place] of Bed 4: Min guard  Sit to Supine 4: Min assist;HOB flat  Details for Bed Mobility Assistance (A) for supporting L LE into bed. Cues for proper technique and safety  Transfers  Transfers Sit to Stand;Stand to Sit  Sit to Stand 4: Min guard;With upper extremity assist;From bed;From chair/3-in-1  Stand to Sit 4: Min guard;To bed;To chair/3-in-1  Details for Transfer Assistance Cues for safety and proper technique  Ambulation/Gait  Ambulation/Gait Assistance 4: Min guard  Ambulation Distance (Feet) 40 Feet  Assistive device Rolling walker  Ambulation/Gait Assistance Details Cues for proper step sequence, posture, and safety  Gait Pattern Step-to pattern;Decreased stride length;Antalgic  Gait velocity decreased  Stairs Yes  Stairs Assistance 4: Min assist  Stairs Assistance Details (indicate cue type and reason) (A) with RW placement and cues for safety and proper technique  Stair Management Technique No rails;Backwards;With walker  Number of Stairs 3   Wheelchair Mobility  Wheelchair Mobility No  PT - End of Session  Equipment Utilized During Treatment Gait belt  Activity Tolerance Patient  tolerated treatment well  Patient left in bed;with call bell/phone within reach;with family/visitor present  Nurse Communication Mobility status  PT - Assessment/Plan  Comments on Treatment Session Pt had house slippers she wanted to try using for ambulation.  Spouse and pt educated on proper stair sequence and both participated during the activity.  Plan for next session is to increase ambulation distance and ROM. Pt ready for d/c when deemed medically safe  PT Plan Discharge plan remains appropriate;Frequency remains appropriate  PT Frequency 7X/week  Recommendations for Other Services Other (comment) (None)  Follow Up Recommendations Home health PT  Equipment Recommended None recommended by PT  Acute Rehab PT Goals  PT Goal Formulation With patient  Time For Goal Achievement 03/02/12  Potential to Achieve Goals Good  Pt will go Supine/Side to Sit with modified independence;with HOB 0 degrees  PT Goal: Supine/Side to Sit - Progress Progressing toward goal  Pt will go Sit to Supine/Side with modified independence;with HOB 0 degrees  PT Goal: Sit to Supine/Side - Progress Progressing toward goal  Pt will go Sit to Stand with modified independence;with upper extremity assist  PT Goal: Sit to Stand - Progress Progressing toward goal  Pt will go Stand to Sit with modified independence;with upper extremity assist  PT Goal: Stand to Sit - Progress Progressing toward goal  Pt will Ambulate 51 - 150 feet;with supervision;with least restrictive assistive device  PT Goal: Ambulate - Progress Progressing toward goal  Pt will Go Up / Down Stairs 3-5 stairs;with min assist;with least restrictive assistive device  PT Goal: Up/Down Stairs - Progress Met  Pt pain at 6/10 in L LE.  Amy DiTommaso, SPT  [place], [place] DPT [phone]

## 2012-02-25 NOTE — Progress Notes (Signed)
Physical Therapy Progress Note   02/25/12 1200  PT Visit Information  Last PT Received On 02/25/12  Assistance Needed +1  PT Time Calculation  PT Start Time 0950  PT Stop Time 1022  PT Time Calculation (min) 32 min  Subjective Data  Subjective Depending how I do in therapy I may be going home tomorrow  Patient Stated Goal To go home  Precautions  Precautions Knee  Required Braces or Orthoses Knee Immobilizer - Left  Restrictions  Weight Bearing Restrictions Yes  LLE Weight Bearing WBAT  Cognition  Overall Cognitive Status Appears within functional limits for tasks assessed/performed  Arousal/Alertness Awake/alert  Orientation Level Appears intact for tasks assessed  Behavior During Session [facility] for tasks performed  Bed Mobility  Bed Mobility Not assessed  Transfers  Transfers Sit to Stand;Stand to Sit  Sit to Stand 4: Min assist;With upper extremity assist;From chair/3-in-1;From toilet  Stand to Sit 4: Min guard;With upper extremity assist;To chair/3-in-1;To toilet  Details for Transfer Assistance (A) with initiating mvt.  Cues for hand placement and safety.  Ambulation/Gait  Ambulation/Gait Assistance 4: Min assist  Ambulation Distance (Feet) 25 Feet  Assistive device Rolling walker  Ambulation/Gait Assistance Details (A) with rolling walker.  Cues for safety and proper technique.  Gait Pattern Step-to pattern;Decreased stride length;Antalgic  Gait velocity decreased  Stairs No  Wheelchair Mobility  Wheelchair Mobility No  Balance  Balance Assessed No  Exercises  Exercises Total Joint  Total Joint Exercises  Ankle Circles/Pumps AROM;Both;10 reps  Quad Sets AROM;Left;5 reps  Heel Slides AAROM;Left;5 reps  PT - End of Session  Equipment Utilized During Treatment Gait belt  Activity Tolerance Patient tolerated treatment well  Patient left in chair;with call bell/phone within reach;with family/visitor present  Nurse Communication Mobility status  PT - Assessment/Plan   Comments on Treatment Session Pt c/o of more pain today than yesterday and explained that her quad muscle feels very sore.  Family and pt educated on home environment and modalities.  Plan for next session is to negotiate steps and increase ambulation distance.  PT Plan Discharge plan remains appropriate;Frequency remains appropriate  PT Frequency 7X/week  Recommendations for Other Services Other (comment) (None)  Follow Up Recommendations Home health PT  Equipment Recommended None recommended by PT  Acute Rehab PT Goals  PT Goal Formulation With patient  Time For Goal Achievement 03/02/12  Potential to Achieve Goals Good  Pt will go Sit to Stand with modified independence;with upper extremity assist  PT Goal: Sit to Stand - Progress Progressing toward goal  Pt will go Stand to Sit with modified independence;with upper extremity assist  PT Goal: Stand to Sit - Progress Progressing toward goal  Pt will Ambulate 51 - 150 feet;with supervision;with least restrictive assistive device  PT Goal: Ambulate - Progress Progressing toward goal  Pt will Perform Home Exercise Program Independently  PT Goal: Perform Home Exercise Program - Progress Progressing toward goal    Pt reports pain in L LE did not rate.  Rendi Mapel, SPT

## 2012-02-25 NOTE — Progress Notes (Signed)
Agree with PT Progress Note.  [place], [place] DPT [phone]

## 2012-02-26 LAB — CBC
HCT: 23.5 % — ABNORMAL LOW (ref 36.0–46.0)
Hemoglobin: 7.7 g/dL — ABNORMAL LOW (ref 12.0–15.0)
MCV: 78.9 fL (ref 78.0–100.0)
RBC: 2.98 MIL/uL — ABNORMAL LOW (ref 3.87–5.11)
WBC: 9.4 10*3/uL (ref 4.0–10.5)

## 2012-02-26 LAB — GLUCOSE, CAPILLARY
Glucose-Capillary: 157 mg/dL — ABNORMAL HIGH (ref 70–99)
Glucose-Capillary: 159 mg/dL — ABNORMAL HIGH (ref 70–99)
Glucose-Capillary: 141 mg/dL — ABNORMAL HIGH (ref 70–99)
Glucose-Capillary: 154 mg/dL — ABNORMAL HIGH (ref 70–99)
Glucose-Capillary: 149 mg/dL — ABNORMAL HIGH (ref 70–99)

## 2012-02-26 LAB — PROTIME-INR: INR: 1.65 — ABNORMAL HIGH (ref 0.00–1.49)

## 2012-02-26 MED ORDER — WARFARIN SODIUM 7.5 MG PO TABS
7.5000 mg | ORAL_TABLET | Freq: Every day | ORAL | Status: AC
  Administered 2012-02-26: 7.5 mg via ORAL
  Filled 2012-02-26: qty 1

## 2012-02-26 MED ORDER — SODIUM CHLORIDE 0.9 % IV BOLUS (SEPSIS)
500.0000 mL | Freq: Once | INTRAVENOUS | Status: AC
  Administered 2012-02-26: 500 mL via INTRAVENOUS

## 2012-02-26 NOTE — Progress Notes (Signed)
Patient ID: Tabitha Oneal, female   DOB: May 20, 1956, 56 y.o.   MRN: [id number]     Subjective:  Patient reports pain as mild to moderate.  States that she is doing better but gets dizzy when standing.   Objective:   VITALS:   Filed Vitals:   02/25/12 1900 02/25/12 2043 02/26/12 0410 02/26/12 0428  BP: 135/63  147/79   Pulse: 134  108   Temp: 99.9 F (37.7 C)  99.2 F (37.3 C)   TempSrc:      Resp: 20 18 18 18   Height:      Weight:      SpO2: 97%  96%     ABD soft Sensation intact distally Dorsiflexion/Plantar flexion intact Incision: dressing C/D/I and no drainage  LABS  Results for orders placed during the hospital encounter of 02/23/12 (from the past 24 hour(s))  GLUCOSE, CAPILLARY     Status: Abnormal   Collection Time   02/25/12  4:37 PM      Component Value Range   Glucose-Capillary 157 (*) 70 - 99 mg/dL  GLUCOSE, CAPILLARY     Status: Abnormal   Collection Time   02/25/12 11:38 PM      Component Value Range   Glucose-Capillary 156 (*) 70 - 99 mg/dL  PROTIME-INR     Status: Abnormal   Collection Time   02/26/12  5:50 AM      Component Value Range   Prothrombin Time 19.0 (*) 11.6 - 15.2 seconds   INR 1.65 (*) 0.00 - 1.49  CBC     Status: Abnormal   Collection Time   02/26/12  5:50 AM      Component Value Range   WBC 9.4  4.0 - 10.5 K/uL   RBC 2.98 (*) 3.87 - 5.11 MIL/uL   Hemoglobin 7.7 (*) 12.0 - 15.0 g/dL   HCT 40.9 (*) 81.1 - 91.4 %   MCV 78.9  78.0 - 100.0 fL   MCH 25.8 (*) 26.0 - 34.0 pg   MCHC 32.8  30.0 - 36.0 g/dL   RDW 78.2  95.6 - 21.3 %   Platelets 214  150 - 400 K/uL  GLUCOSE, CAPILLARY     Status: Abnormal   Collection Time   02/26/12  7:28 AM      Component Value Range   Glucose-Capillary 159 (*) 70 - 99 mg/dL  GLUCOSE, CAPILLARY     Status: Abnormal   Collection Time   02/26/12 11:19 AM      Component Value Range   Glucose-Capillary 141 (*) 70 - 99 mg/dL   Comment 1 Documented in Chart     Comment 2 Notify RN      No  results found.  Assessment/Plan: 3 Days Post-Op   Principal Problem:  *Osteoarthritis of left knee   Advance diet Up with therapy Plan for discharge tomorrow ABLA gave 500 CC fluid and will monitor symptoms.   Haskel Khan 02/26/2012, 11:49 AM   Teryl Lucy, MD Cell [phone] Pager [phone]

## 2012-02-26 NOTE — Progress Notes (Signed)
Physical Therapy Treatment Patient Details Name: Tabitha Oneal MRN: [id number] DOB: [birth year] Today's Date: 02/26/2012 Time: 5621-3086 PT Time Calculation (min): 31 min  PT Assessment / Plan / Recommendation Comments on Treatment Session  Pt had difficult time relaxing muscles to perform HEP.  Measurement 25-60.  Nurse was informed and a muscle relaxer was ordered.  Pt requested to negotiate stairs to become more comfortable before d/c    Follow Up Recommendations  Home health PT    Barriers to Discharge        Equipment Recommendations  None recommended by PT    Recommendations for Other Services Other (comment) (None)  Frequency 7X/week   Plan Discharge plan remains appropriate;Frequency remains appropriate    Precautions / Restrictions Precautions Precautions: Knee Restrictions Weight Bearing Restrictions: Yes LLE Weight Bearing: Weight bearing as tolerated   Pertinent Vitals/Pain Pt did not rate pain in L LE.    Mobility  Bed Mobility Bed Mobility: Supine to Sit;Sitting - Scoot to Edge of Bed Supine to Sit: 5: Supervision;HOB flat Sitting - Scoot to Edge of Bed: 5: Supervision Details for Bed Mobility Assistance: Supervision for safety Transfers Transfers: Sit to Stand;Stand to Sit Sit to Stand: 4: Min guard;With upper extremity assist;From bed Stand to Sit: 4: Min guard;With upper extremity assist;To chair/3-in-1 Details for Transfer Assistance: Cues for proper technique and safety Ambulation/Gait Ambulation/Gait Assistance: 4: Min guard Ambulation Distance (Feet): 65 Feet Assistive device: Rolling walker Ambulation/Gait Assistance Details: Cues for safety and proper step sequence Gait Pattern: Step-to pattern;Decreased stride length;Antalgic Gait velocity: decreased Stairs: Yes Stairs Assistance: 4: Min assist Stairs Assistance Details (indicate cue type and reason): (A) with RW placement and cues for safety and proper technique Stair Management  Technique: No rails;Backwards;With walker Number of Stairs: 3  Wheelchair Mobility Wheelchair Mobility: No    Exercises Total Joint Exercises Quad Sets: AROM;Left;5 reps Heel Slides: AAROM;Left;5 reps   PT Diagnosis:    PT Problem List:   PT Treatment Interventions:     PT Goals Acute Rehab PT Goals PT Goal Formulation: With patient Time For Goal Achievement: 03/02/12 Potential to Achieve Goals: Good Pt will go Supine/Side to Sit: with modified independence;with HOB 0 degrees PT Goal: Supine/Side to Sit - Progress: Progressing toward goal Pt will go Sit to Stand: with modified independence;with upper extremity assist PT Goal: Sit to Stand - Progress: Progressing toward goal Pt will go Stand to Sit: with modified independence;with upper extremity assist PT Goal: Stand to Sit - Progress: Progressing toward goal Pt will Ambulate: 51 - 150 feet;with supervision;with least restrictive assistive device PT Goal: Ambulate - Progress: Partly met Pt will Go Up / Down Stairs: 3-5 stairs;with min assist;with least restrictive assistive device PT Goal: Up/Down Stairs - Progress: Met Pt will Perform Home Exercise Program: Independently PT Goal: Perform Home Exercise Program - Progress: Progressing toward goal  Visit Information  Last PT Received On: 02/26/12 Assistance Needed: +1    Subjective Data  Subjective: I want to work on the stairs again Patient Stated Goal: To go home   Cognition  Overall Cognitive Status: Appears within functional limits for tasks assessed/performed Arousal/Alertness: Awake/alert Orientation Level: Appears intact for tasks assessed Behavior During Session: [facility] for tasks performed    Balance     End of Session PT - End of Session Equipment Utilized During Treatment: Gait belt Activity Tolerance: Patient tolerated treatment well Patient left: in chair;with call bell/phone within reach Nurse Communication: Mobility status   GP  DITOMMASO,  AMY 02/26/2012, 9:09 AM  Jake Shark, PT DPT [phone]

## 2012-02-26 NOTE — Progress Notes (Signed)
Referral received for SNF. Chart reviewed and CSW has spoken with RNCM who indicates that patient is for DC to home with Home Health and DME.  CSW to sign off. Please re-consult if CSW needs arise.  Rembert Browe T. Guthrie Lemme, LCSWA  [phone]  

## 2012-02-26 NOTE — Progress Notes (Signed)
Pt this AM reported dizziness when ambulating with physical therapy.   Janace Litten, PA & Dr. Dion Saucier notified of pt's 7.7 hgb & dizziness, orders received for 500 cc bolus.  If pt still symptomatic after 1st bolus, to give 2nd bolus.  Pt reported dizziness better after 1st bolus.  2nd bolus then administered, pt now states that dizziness has completely resolved.  Nsg to continue to monitor.

## 2012-02-26 NOTE — Progress Notes (Signed)
Physical Therapy Progress Note   02/26/12 1300  PT Visit Information  Last PT Received On 02/26/12  Assistance Needed +1  PT Time Calculation  PT Start Time 1340  PT Stop Time 1355  PT Time Calculation (min) 15 min  Subjective Data  Subjective My heart's broken that I am not going home today.  Patient Stated Goal To go home  Precautions  Precautions Knee  Restrictions  Weight Bearing Restrictions Yes  LLE Weight Bearing WBAT  Cognition  Overall Cognitive Status Appears within functional limits for tasks assessed/performed  Arousal/Alertness Awake/alert  Orientation Level Appears intact for tasks assessed  Behavior During Session [facility] for tasks performed  Bed Mobility  Bed Mobility Not assessed  Transfers  Transfers Not assessed  Ambulation/Gait  Ambulation/Gait Assistance Not tested (comment)  Exercises  Exercises Total Joint  Total Joint Exercises  Ankle Circles/Pumps AROM;Both;10 reps  Quad Sets AROM;Left;5 reps  Heel Slides AAROM;Left;5 reps  Hip ABduction/ADduction AAROM;Left;5 reps  PT - End of Session  Activity Tolerance Patient limited by pain  Patient left in bed;with call bell/phone within reach;with family/visitor present  Nurse Communication Patient requests pain meds  PT - Assessment/Plan  Comments on Treatment Session Pt very upset about not be d/c today and c/o pain in L LE.  Pt reviewed HEP and did not have any questions about the program.    PT Plan Discharge plan remains appropriate;Frequency remains appropriate  PT Frequency 7X/week  Recommendations for Other Services Other (comment) (None)  Follow Up Recommendations Home health PT  Equipment Recommended None recommended by PT  Acute Rehab PT Goals  PT Goal Formulation With patient  Time For Goal Achievement 03/02/12  Potential to Achieve Goals Good  Pt will Perform Home Exercise Program Independently  PT Goal: Perform Home Exercise Program - Progress Progressing toward goal    Pt reports pain  in L LE did not rate.  Daniel Ritthaler,SPT

## 2012-02-26 NOTE — Consult Note (Signed)
Pharmacy Consult-Anticoagulation  Pharmacy Consult: Coumadin > 56 y.o.female admitted for DJD LEFT KNEE who is currently on Coumadin with Lovenox bridging for VTE prophylaxis following L-TKA.  Current Labs: Hematology  [facility] 02/26/12 0550 02/25/12 0632 02/24/12 0605  HGB 7.7* 8.0* --  HCT 23.5* 24.9* 26.4*  PLT 214 215 260  LABPROT 19.0* 16.6* 14.1  INR 1.65* 1.38 1.10  CREATININE -- -- 0.82   Lab Results  Component Value Date   INR 1.65* 02/26/2012   INR 1.38 02/25/2012   INR 1.10 02/24/2012      atorvastatin 80 mg Oral q1800  azelastine 1 spray Each Nare BID  docusate sodium 100 mg Oral BID  enoxaparin (LOVENOX) injection 30 mg Subcutaneous Q12H  fluticasone 2 puff Inhalation BID  insulin aspart 0-15 Units Subcutaneous TID WC  linagliptin 5 mg Oral Daily  lisinopril 10 mg Oral Daily  montelukast 10 mg Oral QHS  senna 1 tablet Oral BID  sodium chloride 500 mL Intravenous Once  sodium chloride 500 mL Intravenous Once  warfarin 7.5 mg Oral q1800  warfarin 7.5 mg Oral ONCE-1800  Warfarin - Pharmacist Dosing Inpatient  Does not apply q1800    Estimated Creatinine Clearance: 83.4 ml/min (by C-G formula based on Cr of 0.82).  Assessment:  INR is trending up toward therapeutic target range.  INR 1.65...  No bleeding complications observed.  Goal/Plan: INR goal is 2-3. Coumadin 7.5 mg po daily. Daily INR's, CBC.  Deryl Ports, Elisha Headland, Pharm.D. 02/26/2012  3:06 PM

## 2012-02-26 NOTE — Progress Notes (Signed)
Agree with PT treatment note.  Shalese Strahan, PT DPT [phone]  

## 2012-02-27 LAB — GLUCOSE, CAPILLARY

## 2012-02-27 LAB — CBC
HCT: 21.4 % — ABNORMAL LOW (ref 36.0–46.0)
Hemoglobin: 6.9 g/dL — CL (ref 12.0–15.0)
MCH: 25.5 pg — ABNORMAL LOW (ref 26.0–34.0)
MCHC: 32.2 g/dL (ref 30.0–36.0)
MCV: 79 fL (ref 78.0–100.0)
RBC: 2.71 MIL/uL — ABNORMAL LOW (ref 3.87–5.11)

## 2012-02-27 LAB — PROTIME-INR: Prothrombin Time: 21.4 seconds — ABNORMAL HIGH (ref 11.6–15.2)

## 2012-02-27 NOTE — Consult Note (Signed)
Pharmacy Consult-Anticoagulation  Pharmacy Consult:  Coumadin > 56 y.o.female admitted for DJD LEFT KNEE who is currently on Coumadin for VTE prophylaxis following L-TKA.   Current Labs: Hematology  [facility] 02/27/12 0605 02/26/12 0550 02/25/12 0632  HGB 6.9* 7.7* --  HCT 21.4* 23.5* 24.9*  PLT 222 214 215  LABPROT 21.4* 19.0* 16.6*  INR 1.94* 1.65* 1.38   Lab Results  Component Value Date   INR 1.94* 02/27/2012   INR 1.65* 02/26/2012   INR 1.38 02/25/2012    Estimated Creatinine Clearance: 83.4 ml/min (by C-G formula based on Cr of 0.82).  Current Meds:    atorvastatin 80 mg Oral q1800  azelastine 1 spray Each Nare BID  docusate sodium 100 mg Oral BID  enoxaparin (LOVENOX) injection 30 mg Subcutaneous Q12H  fluticasone 2 puff Inhalation BID  insulin aspart 0-15 Units Subcutaneous TID WC  linagliptin 5 mg Oral Daily  lisinopril 10 mg Oral Daily  montelukast 10 mg Oral QHS  senna 1 tablet Oral BID  sodium chloride 500 mL Intravenous Once  sodium chloride 500 mL Intravenous Once  warfarin 7.5 mg Oral q1800   Assessment:  56 y/o female Post-op Day # 4 s/p L-TKA.  INR is nearing therapeutic target range.  INR = 1.94.  ABLA >> H/H continuing to trend downward.  HGB = 6.9.  Packed cells ordered this AM  Goal/Plan:  INR goal is 2-3.  Continue Coumadin 7.5 mg daily.  Discontinue Lovenox.  INR >= 1.8.  Daily INR's, CBC.  Denaisha Swango, Elisha Headland, Pharm.D. 02/27/2012  9:28 AM

## 2012-02-27 NOTE — Progress Notes (Signed)
Physical Therapy Treatment Patient Details Name: Tabitha Oneal MRN: [id number] DOB: Oct 13, 1955 Today's Date: 02/27/2012 Time: 1478-2956 PT Time Calculation (min): 29 min  PT Assessment / Plan / Recommendation Comments on Treatment Session  Pt tolerated therapy better today than yesterday.  Pt able to increase gait quality and distance.  Pt left with towel roll under ankle to help facilitate extension    Follow Up Recommendations  Home health PT    Barriers to Discharge  None      Equipment Recommendations  None recommended by PT    Recommendations for Other Services Other (comment) (None)  Frequency 7X/week   Plan Discharge plan remains appropriate;Frequency remains appropriate    Precautions / Restrictions Precautions Precautions: Knee Restrictions Weight Bearing Restrictions: Yes LLE Weight Bearing: Weight bearing as tolerated   Pertinent Vitals/Pain Pain in L LE 4/10    Mobility  Bed Mobility Bed Mobility: Supine to Sit;Sitting - Scoot to Edge of Bed Supine to Sit: 5: Supervision;HOB flat Sitting - Scoot to Edge of Bed: 5: Supervision Details for Bed Mobility Assistance: Supervision for safety Transfers Transfers: Sit to Stand;Stand to Sit Sit to Stand: 5: Supervision;With upper extremity assist;From bed Stand to Sit: 5: Supervision;With upper extremity assist;To chair/3-in-1 Details for Transfer Assistance: Supervision for safety Ambulation/Gait Ambulation/Gait Assistance: 4: Min guard Ambulation Distance (Feet): 200 Feet Assistive device: Rolling walker Ambulation/Gait Assistance Details: Cues for proper breathing techniques, proper step sequence, posture and safety Gait Pattern: Step-to pattern;Step-through pattern;Antalgic Gait velocity: decreased Stairs: No    Exercises     PT Diagnosis:    PT Problem List:   PT Treatment Interventions:     PT Goals Acute Rehab PT Goals PT Goal Formulation: With patient Time For Goal Achievement:  03/02/12 Potential to Achieve Goals: Good Pt will go Supine/Side to Sit: with modified independence;with HOB 0 degrees PT Goal: Supine/Side to Sit - Progress: Progressing toward goal Pt will go Sit to Stand: with modified independence;with upper extremity assist PT Goal: Sit to Stand - Progress: Progressing toward goal Pt will go Stand to Sit: with modified independence;with upper extremity assist PT Goal: Stand to Sit - Progress: Progressing toward goal Pt will Ambulate: 51 - 150 feet;with supervision;with least restrictive assistive device PT Goal: Ambulate - Progress: Met  Visit Information  Last PT Received On: 02/27/12 Assistance Needed: +1    Subjective Data  Subjective: I feel like I am getting life back into me Patient Stated Goal: To go home   Cognition  Overall Cognitive Status: Appears within functional limits for tasks assessed/performed Arousal/Alertness: Awake/alert Orientation Level: Appears intact for tasks assessed Behavior During Session: [facility] for tasks performed    Balance     End of Session PT - End of Session Equipment Utilized During Treatment: Gait belt Activity Tolerance: Patient tolerated treatment well Patient left: in chair;with call bell/phone within reach Nurse Communication: Mobility status   GP     DITOMMASO, AMY 02/27/2012, 3:17 PM Robinson, PT DPT [phone]

## 2012-02-27 NOTE — Progress Notes (Signed)
Patient ID: Tabitha Oneal, female   DOB: November 16, 1955, 56 y.o.   MRN: [id number]     Subjective:  Patient reports pain as mild to moderate.  Feeling dizzy and tired.  Pain is better.  Objective:   VITALS:   Filed Vitals:   02/26/12 2045 02/26/12 2100 02/26/12 2214 02/27/12 0501  BP: 136/56 130/50  140/64  Pulse: 116 140  108  Temp: 99.4 F (37.4 C)   99.7 F (37.6 C)  TempSrc:      Resp: 18 20  18   Height:      Weight:      SpO2: 94%  94% 99%    ABD soft Sensation intact distally Dorsiflexion/Plantar flexion intact Incision: dressing C/D/I and no drainage  LABS  Results for orders placed during the hospital encounter of 02/23/12 (from the past 24 hour(s))  GLUCOSE, CAPILLARY     Status: Abnormal   Collection Time   02/26/12 11:19 AM      Component Value Range   Glucose-Capillary 141 (*) 70 - 99 mg/dL   Comment 1 Documented in Chart     Comment 2 Notify RN    GLUCOSE, CAPILLARY     Status: Abnormal   Collection Time   02/26/12  4:23 PM      Component Value Range   Glucose-Capillary 154 (*) 70 - 99 mg/dL   Comment 1 Documented in Chart     Comment 2 Notify RN    GLUCOSE, CAPILLARY     Status: Abnormal   Collection Time   02/26/12  9:38 PM      Component Value Range   Glucose-Capillary 149 (*) 70 - 99 mg/dL  PROTIME-INR     Status: Abnormal   Collection Time   02/27/12  6:05 AM      Component Value Range   Prothrombin Time 21.4 (*) 11.6 - 15.2 seconds   INR 1.94 (*) 0.00 - 1.49  CBC     Status: Abnormal   Collection Time   02/27/12  6:05 AM      Component Value Range   WBC 8.3  4.0 - 10.5 K/uL   RBC 2.71 (*) 3.87 - 5.11 MIL/uL   Hemoglobin 6.9 (*) 12.0 - 15.0 g/dL   HCT 40.9 (*) 81.1 - 91.4 %   MCV 79.0  78.0 - 100.0 fL   MCH 25.5 (*) 26.0 - 34.0 pg   MCHC 32.2  30.0 - 36.0 g/dL   RDW 78.2  95.6 - 21.3 %   Platelets 222  150 - 400 K/uL  GLUCOSE, CAPILLARY     Status: Abnormal   Collection Time   02/27/12  7:15 AM      Component Value Range   Glucose-Capillary 155 (*) 70 - 99 mg/dL    No results found.  Assessment/Plan: 4 Days Post-Op   Principal Problem:  *Osteoarthritis of left knee   Advance diet Up with therapy Plan for discharge tomorrow ABLA transfuse two units PRBC   DOUGLAS PARRY, BRANDON 02/27/2012, 8:35 AM   Teryl Lucy, MD Cell [phone] Pager [phone]

## 2012-02-27 NOTE — Progress Notes (Signed)
[facility]  Patient Status: New  [facility] is providing the following services: RN, PT and OT  If patient discharges after hours, please call [phone].   Tabitha Oneal 02/27/2012, 2:32 PM  On the schedule for RN and PT visits at home on [facility]

## 2012-02-27 NOTE — Progress Notes (Signed)
Patient with complaints of slight dizziness upon ambulating to and forth from the bathroom  No complaints of nausea or shortness of breath.  Patient states that the dizziness "has come and go" throughout the day.  BP taken, 130/50 sitting on the edge of the bed after ambulating, HR was 140, although patient has been experiencing ongoing tachycardia throughout hospital stay.  BP laying down prior to ambulation was 136/56.  Spoke with Altamese Cabal, PA on call for Dr. Dion Saucier, new order to check CBC in am.  No further complaints of dizziness of any kind this shift, nursing will continue to monitor.

## 2012-02-27 NOTE — Progress Notes (Signed)
Lab called this am with critical hemoglobin of 6.9.  Notified Janace Litten, PA, who works with Dr. Dion Saucier, awaiting further orders.

## 2012-02-28 LAB — HEMOGLOBIN AND HEMATOCRIT, BLOOD
HCT: 26.6 % — ABNORMAL LOW (ref 36.0–46.0)
Hemoglobin: 8.9 g/dL — ABNORMAL LOW (ref 12.0–15.0)

## 2012-02-28 LAB — PROTIME-INR: INR: 2.16 — ABNORMAL HIGH (ref 0.00–1.49)

## 2012-02-28 MED ORDER — WARFARIN SODIUM 7.5 MG PO TABS
7.5000 mg | ORAL_TABLET | Freq: Once | ORAL | Status: AC
  Administered 2012-02-28: 7.5 mg via ORAL
  Filled 2012-02-28: qty 1

## 2012-02-28 MED ORDER — FUROSEMIDE 10 MG/ML IJ SOLN
20.0000 mg | Freq: Once | INTRAMUSCULAR | Status: AC
  Administered 2012-02-28: 20 mg via INTRAVENOUS
  Filled 2012-02-28: qty 2

## 2012-02-28 NOTE — Progress Notes (Signed)
Physical Therapy Treatment Patient Details Name: Tabitha Oneal MRN: [id number] DOB: 1955-07-23 Today's Date: 02/28/2012 Time: 1010-1033 PT Time Calculation (min): 23 min  PT Assessment / Plan / Recommendation Comments on Treatment Session  Pt moving fairly well.  Trialed steps again this session.  Handout provided & placed in pt's room.  Cont's to have difficulty with terminal knee extension at this date; towel roll under pt's ankle to promote extension.      Follow Up Recommendations  Home health PT     Does the patient have the potential to tolerate intense rehabilitation     Barriers to Discharge        Equipment Recommendations  None recommended by PT    Recommendations for Other Services    Frequency 7X/week   Plan Discharge plan remains appropriate;Frequency remains appropriate    Precautions / Restrictions Precautions Precautions: Knee Restrictions LLE Weight Bearing: Weight bearing as tolerated    Pertinent Vitals/Pain 6/10  Lt knee.  Premedicated per pt.      Mobility  Bed Mobility Bed Mobility: Supine to Sit;Sitting - Scoot to Edge of Bed Supine to Sit: 5: Supervision;HOB flat Sitting - Scoot to Edge of Bed: 5: Supervision Details for Bed Mobility Assistance: Insructed pt on hooking technique to assist with LLE management OOB & while scooting to EOB.   Transfers Transfers: Sit to Stand;Stand to Sit Sit to Stand: 5: Supervision;With upper extremity assist;With armrests;From chair/3-in-1;From bed Stand to Sit: 5: Supervision;With upper extremity assist;With armrests;To chair/3-in-1 Details for Transfer Assistance: Cues for LLE positioning.   Ambulation/Gait Ambulation/Gait Assistance: 5: Supervision Ambulation Distance (Feet): 50 Feet Assistive device: Rolling walker Ambulation/Gait Assistance Details: Cues for sequencing, increased heel strike, terminal knee extension during stance phase, & encouragement to decreased reliance of UE's on RW & increase  WBing through LLE.   Gait Pattern: Step-to pattern;Step-through pattern;Decreased step length - right;Decreased stance time - left;Decreased stride length;Decreased weight shift to left;Left flexed knee in stance Gait velocity: decreased Stairs: Yes Stairs Assistance: 4: Min assist Stairs Assistance Details (indicate cue type and reason): Cues for sequencing & technique.  Assist for RW management & balance.   Stair Management Technique: No rails;Backwards;With walker Number of Stairs: 3  (2x's. ) Wheelchair Mobility Wheelchair Mobility: No    Exercises Total Joint Exercises Ankle Circles/Pumps: AROM;Both;20 reps Quad Sets: AROM;Both;15 reps Hip ABduction/ADduction: AAROM;Left;10 reps Straight Leg Raises: AAROM;Left;10 reps     PT Goals Acute Rehab PT Goals Time For Goal Achievement: 03/02/12 Potential to Achieve Goals: Good PT Goal: Supine/Side to Sit - Progress: Progressing toward goal Pt will go Sit to Supine/Side: with modified independence;with HOB 0 degrees Pt will go Sit to Stand: with modified independence;with upper extremity assist PT Goal: Sit to Stand - Progress: Progressing toward goal Pt will go Stand to Sit: with modified independence;with upper extremity assist PT Goal: Stand to Sit - Progress: Progressing toward goal Pt will Ambulate: 51 - 150 feet;with supervision;with least restrictive assistive device PT Goal: Ambulate - Progress: Progressing toward goal Pt will Go Up / Down Stairs: 3-5 stairs;with min assist;with least restrictive assistive device PT Goal: Up/Down Stairs - Progress: Progressing toward goal Pt will Perform Home Exercise Program: Independently PT Goal: Perform Home Exercise Program - Progress: Progressing toward goal  Visit Information  Last PT Received On: 02/28/12 Assistance Needed: +1    Subjective Data  Patient Stated Goal: To go home   Cognition  Overall Cognitive Status: Appears within functional limits for tasks  assessed/performed Arousal/Alertness: Awake/alert  Orientation Level: Appears intact for tasks assessed Behavior During Session: [facility] for tasks performed    Balance     End of Session PT - End of Session Equipment Utilized During Treatment: Gait belt Activity Tolerance: Patient tolerated treatment well Patient left: in chair;with call bell/phone within reach Nurse Communication: Mobility status     Verdell Face, [place] [phone] 02/28/2012

## 2012-02-28 NOTE — Progress Notes (Signed)
Physical Therapy Treatment Patient Details Name: KARMINA ZUFALL MRN: [id number] DOB: 10/01/1955 Today's Date: 02/28/2012 Time: 4098-1191 PT Time Calculation (min): 23 min  PT Assessment / Plan / Recommendation Comments on Treatment Session  Focus of session was progression of step-to sequence to step-through sequence.      Follow Up Recommendations  Home health PT     Does the patient have the potential to tolerate intense rehabilitation     Barriers to Discharge        Equipment Recommendations  None recommended by PT    Recommendations for Other Services    Frequency 7X/week   Plan Discharge plan remains appropriate;Frequency remains appropriate    Precautions / Restrictions Precautions Precautions: Knee Restrictions LLE Weight Bearing: Weight bearing as tolerated    Pertinent Vitals/Pain 5/10  Knee.  Premedicated.      Mobility  Bed Mobility Bed Mobility: Supine to Sit;Sitting - Scoot to [place] of Bed;Sit to Supine;Scooting to [facility] Supine to Sit: 6: Modified independent (Device/Increase time) Sitting - Scoot to Edge of Bed: 6: Modified independent (Device/Increase time) Sit to Supine: 4: Min assist;HOB flat Scooting to [facility]: 6: Modified independent (Device/Increase time);With rail Details for Bed Mobility Assistance: Pt utilized hooking technique from previous PT session.  Min assist to lift LE's onto bed.   Transfers Transfers: Sit to Stand;Stand to Sit Sit to Stand: 5: Supervision;With upper extremity assist;From bed Stand to Sit: 5: Supervision;With upper extremity assist;To bed Details for Transfer Assistance: Encouragement to increase use of LLE with sit>stand.   Ambulation/Gait Ambulation/Gait Assistance: 4: Min guard;5: Supervision Ambulation Distance (Feet): 200 Feet Assistive device: Rolling walker Ambulation/Gait Assistance Details: Supervision for step-to pattern & min guard for step-through pattern.  Close guarding due to pt slightly unsteady when  attempting step-through pattern.  Cont's to have difficulty with terminal knee ext. during stance phase.   Gait Pattern: Step-to pattern;Step-through pattern;Decreased stance time - left;Decreased step length - right;Antalgic;Left flexed knee in stance Gait velocity: decreased Stairs: No Stairs Assistance: 4: Min assist Stairs Assistance Details (indicate cue type and reason): Cues for sequencing & technique.  Assist for RW management & balance.   Stair Management Technique: No rails;Backwards;With walker Number of Stairs: 3  (2x's. ) Wheelchair Mobility Wheelchair Mobility: No    Exercises Total Joint Exercises Ankle Circles/Pumps: AROM;Both;20 reps Quad Sets: AROM;Both;15 reps Hip ABduction/ADduction: AAROM;Left;10 reps Straight Leg Raises: AAROM;Left;10 reps     PT Goals Acute Rehab PT Goals Time For Goal Achievement: 03/02/12 Potential to Achieve Goals: Good Pt will go Supine/Side to Sit: with modified independence;with HOB 0 degrees PT Goal: Supine/Side to Sit - Progress: Met Pt will go Sit to Supine/Side: with modified independence;with HOB 0 degrees PT Goal: Sit to Supine/Side - Progress: Progressing toward goal Pt will go Sit to Stand: with modified independence;with upper extremity assist PT Goal: Sit to Stand - Progress: Progressing toward goal Pt will go Stand to Sit: with modified independence;with upper extremity assist PT Goal: Stand to Sit - Progress: Progressing toward goal Pt will Ambulate: 51 - 150 feet;with supervision;with least restrictive assistive device PT Goal: Ambulate - Progress: Progressing toward goal Pt will Go Up / Down Stairs: 3-5 stairs;with min assist;with least restrictive assistive device PT Goal: Up/Down Stairs - Progress: Progressing toward goal Pt will Perform Home Exercise Program: Independently PT Goal: Perform Home Exercise Program - Progress: Progressing toward goal  Visit Information  Last PT Received On: 02/28/12 Assistance Needed:  +1    Subjective Data  Patient Stated  Goal: To go home   Cognition  Overall Cognitive Status: Appears within functional limits for tasks assessed/performed Arousal/Alertness: Awake/alert Orientation Level: Appears intact for tasks assessed Behavior During Session: [facility] for tasks performed    Balance     End of Session PT - End of Session Equipment Utilized During Treatment: Gait belt Activity Tolerance: Patient tolerated treatment well Patient left: in bed;in CPM;with call bell/phone within reach;with family/visitor present Nurse Communication: Mobility status     Verdell Face, [place] [phone] 02/28/2012

## 2012-02-28 NOTE — Progress Notes (Signed)
Subjective: C/o feeling fatigued and weak when up and ambulating.  No complaints of cp, sob.     Objective: Vital signs in last 24 hours: Temp:  [98.2 F (36.8 C)-100 F (37.8 C)] 98.2 F (36.8 C) (10/05 0836) Pulse Rate:  [91-116] 91  (10/05 0836) Resp:  [16-20] 18  (10/05 1554) BP: (117-135)/(50-106) 129/86 mmHg (10/05 0836) SpO2:  [97 %-100 %] 100 % (10/05 1554)  Intake/Output from previous day: 10/04 0701 - 10/05 0700 In: 120 [P.O.:120] Out: -  Intake/Output this shift:     Basename 02/28/12 0635 02/27/12 0605 02/26/12 0550  HGB 8.9* 6.9* 7.7*    Basename 02/28/12 0635 02/27/12 0605 02/26/12 0550  WBC -- 8.3 9.4  RBC -- 2.71* 2.98*  HCT 26.6* 21.4* --  PLT -- 222 214   No results found for this basename: NA:2,K:2,CL:2,CO2:2,BUN:2,CREATININE:2,GLUCOSE:2,CALCIUM:2 in the last 72 hours  Basename 02/28/12 0635 02/27/12 0605  LABPT -- --  INR 2.16* 1.94*   Exam:  Dressing changed.  Wound looks good, steris intact.  No drainage or signs of infection.  Calf nt, nvi.     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  Will transfuse 2 more units or prbc's   Anticipate d/c home tomorrow.  Patient will discuss chronic anemia with primary care physician to see if workup is indicated.     Tabitha Oneal M 02/28/2012, 5:18 PM

## 2012-02-28 NOTE — Progress Notes (Signed)
ANTICOAGULATION CONSULT NOTE - Follow Up Consult  Pharmacy Consult for Coumadin Indication: VTE prophylaxis  No Known Allergies  Patient Measurements: Height: 5' 2.99" (160 cm) Weight: 211 lb 3.2 oz (95.8 kg) IBW/kg (Calculated) : 52.38   Vital Signs: Temp: 98.2 F (36.8 C) (10/05 0836) BP: 129/86 mmHg (10/05 0836) Pulse Rate: 91  (10/05 0836)  Labs:  Basename 02/28/12 0635 02/27/12 0605 02/26/12 0550  HGB 8.9* 6.9* --  HCT 26.6* 21.4* 23.5*  PLT -- 222 214  APTT -- -- --  LABPROT 23.2* 21.4* 19.0*  INR 2.16* 1.94* 1.65*  HEPARINUNFRC -- -- --  CREATININE -- -- --  CKTOTAL -- -- --  CKMB -- -- --  TROPONINI -- -- --    Estimated Creatinine Clearance: 83.4 ml/min (by C-G formula based on Cr of 0.82).   Medications:  Prescriptions prior to admission  Medication Sig Dispense Refill  . albuterol (PROVENTIL HFA;VENTOLIN HFA) 108 (90 BASE) MCG/ACT inhaler Inhale 2 puffs into the lungs every 4 (four) hours as needed. For wheezing      . atorvastatin (LIPITOR) 80 MG tablet Take 80 mg by mouth daily.      Marland Kitchen azelastine (ASTELIN) 137 MCG/SPRAY nasal spray Place 1 spray into the nose 2 (two) times daily. Use in each nostril as directed      . beclomethasone (QVAR) 80 MCG/ACT inhaler Inhale 2 puffs into the lungs 2 (two) times daily.      . Calcium Carbonate-Vitamin D (CALCIUM 600+D3 PO) Take 1 tablet by mouth daily.      . metFORMIN (GLUCOPHAGE) 500 MG tablet Take 500 mg by mouth daily with breakfast.      . montelukast (SINGULAIR) 10 MG tablet Take 10 mg by mouth at bedtime.      . quinapril (ACCUPRIL) 10 MG tablet Take 10 mg by mouth daily.      . sitaGLIPtin (JANUVIA) 100 MG tablet Take 100 mg by mouth daily.        Assessment: 56 y/o F s/p left TKA on Coumadin for VTE prophylaxis. Lovenox bridge d/c'd yesterday.  INR therapeutic today at 2.16.  No bleeding noted   Goal of Therapy:  INR 2-3 Monitor platelets by anticoagulation protocol: Yes   Plan:  1. Coumadin 7.5  mg x 1 tonight. 2. Daily INR    Doris Cheadle, PharmD Clinical Pharmacist Pager: [phone] Phone: [phone] 02/28/2012 12:01 PM

## 2012-02-29 LAB — CBC
HCT: 36.6 % (ref 36.0–46.0)
Hemoglobin: 12.6 g/dL (ref 12.0–15.0)
MCV: 81.2 fL (ref 78.0–100.0)
RDW: 14.3 % (ref 11.5–15.5)
WBC: 10.9 10*3/uL — ABNORMAL HIGH (ref 4.0–10.5)

## 2012-02-29 LAB — BASIC METABOLIC PANEL
CO2: 30 mEq/L (ref 19–32)
Chloride: 96 mEq/L (ref 96–112)
Creatinine, Ser: 0.73 mg/dL (ref 0.50–1.10)
Potassium: 3.2 mEq/L — ABNORMAL LOW (ref 3.5–5.1)

## 2012-02-29 LAB — GLUCOSE, CAPILLARY: Glucose-Capillary: 132 mg/dL — ABNORMAL HIGH (ref 70–99)

## 2012-02-29 LAB — PROTIME-INR: Prothrombin Time: 24.2 seconds — ABNORMAL HIGH (ref 11.6–15.2)

## 2012-02-29 MED ORDER — POTASSIUM CHLORIDE 20 MEQ PO PACK
40.0000 meq | PACK | Freq: Once | ORAL | Status: DC
  Filled 2012-02-29: qty 2

## 2012-02-29 MED ORDER — POTASSIUM CHLORIDE CRYS ER 20 MEQ PO TBCR
40.0000 meq | EXTENDED_RELEASE_TABLET | Freq: Once | ORAL | Status: AC
  Administered 2012-02-29: 40 meq via ORAL
  Filled 2012-02-29: qty 2

## 2012-02-29 MED ORDER — WARFARIN SODIUM 7.5 MG PO TABS
7.5000 mg | ORAL_TABLET | Freq: Once | ORAL | Status: DC
  Filled 2012-02-29: qty 1

## 2012-02-29 NOTE — Progress Notes (Signed)
Physical Therapy Treatment Patient Details Name: Tabitha Oneal MRN: [id number] DOB: 1955/06/19 Today's Date: 02/29/2012 Time: 2130-8657 PT Time Calculation (min): 33 min  PT Assessment / Plan / Recommendation Comments on Treatment Session  S/p LTKA; making improvements with functional mobility, still with decr knee flexion, though; Plan for stair training this pm session    Follow Up Recommendations  Home health PT     Does the patient have the potential to tolerate intense rehabilitation     Barriers to Discharge        Equipment Recommendations  None recommended by PT    Recommendations for Other Services    Frequency 7X/week   Plan Discharge plan remains appropriate;Frequency remains appropriate    Precautions / Restrictions Precautions Precautions: Knee Required Braces or Orthoses: Knee Immobilizer - Left Restrictions LLE Weight Bearing: Weight bearing as tolerated   Pertinent Vitals/Pain 8/10 Left knee, especially with therex; was premedicated    Mobility  Bed Mobility Bed Mobility: Supine to Sit;Sitting - Scoot to [place] of Bed;Sit to Supine;Scooting to [employer] Supine to Sit: 6: Modified independent (Device/Increase time) Sitting - Scoot to Edge of Bed: 6: Modified independent (Device/Increase time) Details for Bed Mobility Assistance: Used RLE to assist LLE Transfers Transfers: Sit to Stand;Stand to Sit Sit to Stand: 5: Supervision;With upper extremity assist;From bed Stand to Sit: 5: Supervision;With upper extremity assist;To bed Details for Transfer Assistance: Cues for technique Ambulation/Gait Ambulation/Gait Assistance: 4: Min guard;5: Supervision Ambulation Distance (Feet): 110 Feet Assistive device: Rolling walker Ambulation/Gait Assistance Details: Pt is definitely more comfortable with step-to pattern; cues to conscientiously activate Left quad in stance for stability Gait Pattern: Step-to pattern;Step-through pattern;Decreased stance time -  left;Decreased step length - right;Antalgic;Left flexed knee in stance    Exercises Total Joint Exercises Ankle Circles/Pumps: AROM;Both;20 reps Quad Sets: AROM;Left;10 reps;Supine Short Arc Quad: AAROM;Left;10 reps;Supine Heel Slides: AAROM;Left;10 reps;Supine (pain limiting range) Straight Leg Raises: AAROM;Left;10 reps   PT Diagnosis:    PT Problem List:   PT Treatment Interventions:     PT Goals Acute Rehab PT Goals Time For Goal Achievement: 03/02/12 Potential to Achieve Goals: Good Pt will go Supine/Side to Sit: with modified independence;with HOB 0 degrees PT Goal: Supine/Side to Sit - Progress: Met Pt will go Sit to Supine/Side: with modified independence;with HOB 0 degrees PT Goal: Sit to Supine/Side - Progress: Met Pt will go Sit to Stand: with modified independence;with upper extremity assist PT Goal: Sit to Stand - Progress: Progressing toward goal Pt will go Stand to Sit: with modified independence;with upper extremity assist PT Goal: Stand to Sit - Progress: Progressing toward goal Pt will Ambulate: 51 - 150 feet;with supervision;with least restrictive assistive device PT Goal: Ambulate - Progress: Progressing toward goal Pt will Perform Home Exercise Program: Independently PT Goal: Perform Home Exercise Program - Progress: Progressing toward goal  Visit Information  Last PT Received On: 02/29/12 Assistance Needed: +1    Subjective Data  Subjective: agreeable to amb Patient Stated Goal: To go home   Cognition  Overall Cognitive Status: Appears within functional limits for tasks assessed/performed Arousal/Alertness: Awake/alert Orientation Level: Appears intact for tasks assessed Behavior During Session: [facility] for tasks performed    Balance     End of Session PT - End of Session Equipment Utilized During Treatment: Gait belt Activity Tolerance: Patient tolerated treatment well Patient left: in chair;with call bell/phone within reach Nurse Communication:  Mobility status   GP     [facility] [place], [place] [phone]  02/29/2012, 12:56 PM

## 2012-02-29 NOTE — Progress Notes (Signed)
Therapy complete and pt d/c'd with belongings with husband via wheelchair escorted by unit NT.

## 2012-02-29 NOTE — Progress Notes (Signed)
Subjective: Feeling much better after transfusion yesterday.  Ready to go home.   Objective: Vital signs in last 24 hours: Temp:  [98.2 F (36.8 C)-100.1 F (37.8 C)] 99.2 F (37.3 C) (10/06 0727) Pulse Rate:  [76-95] 85  (10/06 0727) Resp:  [16-18] 18  (10/06 0727) BP: (135-154)/(64-83) 135/80 mmHg (10/06 0727) SpO2:  [96 %-100 %] 96 % (10/06 0741)  Intake/Output from previous day: 10/05 0701 - 10/06 0700 In: -  Out: 900 [Urine:900] Intake/Output this shift:     Basename 02/29/12 0834 02/28/12 0635 02/27/12 0605  HGB 12.6 8.9* 6.9*    Basename 02/29/12 0834 02/28/12 0635 02/27/12 0605  WBC 10.9* -- 8.3  RBC 4.51 -- 2.71*  HCT 36.6 26.6* --  PLT 323 -- 222    Basename 02/29/12 0834  NA 138  K 3.2*  CL 96  CO2 30  BUN 8  CREATININE 0.73  GLUCOSE 186*  CALCIUM 10.0    Basename 02/29/12 0834 02/28/12 0635  LABPT -- --  INR 2.29* 2.16*    Exam:  Wound looks good.  steris intact.  No drainage or signs of infection.  Calf nt, nvi.   Assessment/Plan: D/c home today.  F/u dr Dion Saucier as scheduled.     Jolayne Branson M 02/29/2012, 10:59 AM

## 2012-02-29 NOTE — Progress Notes (Signed)
ANTICOAGULATION CONSULT NOTE - Follow Up Consult  Pharmacy Consult for Coumadin Indication: VTE prophylaxis  No Known Allergies  Patient Measurements: Height: 5' 2.99" (160 cm) Weight: 211 lb 3.2 oz (95.8 kg) IBW/kg (Calculated) : 52.38   Vital Signs: Temp: 99.2 F (37.3 C) (10/06 0727) Temp src: Oral (10/06 0727) BP: 135/80 mmHg (10/06 0727) Pulse Rate: 85  (10/06 0727)  Labs:  Basename 02/29/12 0834 02/28/12 0635 02/27/12 0605  HGB 12.6 8.9* --  HCT 36.6 26.6* 21.4*  PLT 323 -- 222  APTT -- -- --  LABPROT 24.2* 23.2* 21.4*  INR 2.29* 2.16* 1.94*  HEPARINUNFRC -- -- --  CREATININE 0.73 -- --  CKTOTAL -- -- --  CKMB -- -- --  TROPONINI -- -- --    Estimated Creatinine Clearance: 85.5 ml/min (by C-G formula based on Cr of 0.73).   Medications:  Prescriptions prior to admission  Medication Sig Dispense Refill  . albuterol (PROVENTIL HFA;VENTOLIN HFA) 108 (90 BASE) MCG/ACT inhaler Inhale 2 puffs into the lungs every 4 (four) hours as needed. For wheezing      . atorvastatin (LIPITOR) 80 MG tablet Take 80 mg by mouth daily.      Marland Kitchen azelastine (ASTELIN) 137 MCG/SPRAY nasal spray Place 1 spray into the nose 2 (two) times daily. Use in each nostril as directed      . beclomethasone (QVAR) 80 MCG/ACT inhaler Inhale 2 puffs into the lungs 2 (two) times daily.      . Calcium Carbonate-Vitamin D (CALCIUM 600+D3 PO) Take 1 tablet by mouth daily.      . metFORMIN (GLUCOPHAGE) 500 MG tablet Take 500 mg by mouth daily with breakfast.      . montelukast (SINGULAIR) 10 MG tablet Take 10 mg by mouth at bedtime.      . quinapril (ACCUPRIL) 10 MG tablet Take 10 mg by mouth daily.      . sitaGLIPtin (JANUVIA) 100 MG tablet Take 100 mg by mouth daily.        Assessment: 56 y/o F s/p left TKA on Coumadin for VTE prophylaxis. Lovenox bridge d/c'd 10/4.  INR therapeutic today at 2.29.  No bleeding noted.    Goal of Therapy:  INR 2-3 Monitor platelets by anticoagulation protocol: Yes   Plan:  1. To be discharged today.    If still here this evening: 2. Coumadin 7.5 mg x 1 tonight.  3. Daily INR    Doris Cheadle, PharmD Clinical Pharmacist Pager: [phone] Phone: [phone] 02/29/2012 11:13 AM

## 2012-02-29 NOTE — Progress Notes (Signed)
D/c instructions reviewed with pt and husband by Kyra Searles  RN. Copy of instructions and scripts given to pt. Pt waiting for therapy to work with her again this afternoon on stairs as therapist Jeanice Lim stated this am. Pt will be ready for D/C when therapy session complete.

## 2012-02-29 NOTE — Discharge Summary (Signed)
Physician Discharge Summary  Patient ID: Tabitha Oneal MRN: [id number] DOB/AGE: [birth year] 56 y.o.  Admit date: 02/23/2012 Discharge date: 02/29/2012  Admission Diagnoses:  Osteoarthritis of left knee  Discharge Diagnoses:  Principal Problem:  *Osteoarthritis of left knee Acute blood loss anemia, on chronic anemia  Past Medical History  Diagnosis Date  . Diabetes mellitus   . Hypertension   . Asthma   . Osteoarthritis of left knee 02/23/2012    Surgeries: Procedure(s): TOTAL KNEE ARTHROPLASTY on 02/23/2012   Consultants (if any):    Discharged Condition: Improved  Hospital Course: Tabitha Oneal is an 56 y.o. female who was admitted 02/23/2012 with a diagnosis of Osteoarthritis of left knee and went to the operating room on 02/23/2012 and underwent the above named procedures.    She was given perioperative antibiotics:  Anti-infectives     Start     Dose/Rate Route Frequency Ordered Stop   02/23/12 2000   ceFAZolin (ANCEF) IVPB 2 g/50 mL premix        2 g 100 mL/hr over 30 Minutes Intravenous Every 6 hours 02/23/12 1842 02/24/12 0425   02/23/12 1137   ceFAZolin (ANCEF) 2-3 GM-% IVPB SOLR     Comments: SOMERS, STACY: cabinet override         02/23/12 1137 02/23/12 1352   02/22/12 1552   ceFAZolin (ANCEF) IVPB 2 g/50 mL premix  Status:  Discontinued        2 g 100 mL/hr over 30 Minutes Intravenous 60 min pre-op 02/22/12 1552 02/23/12 1829        .  She was given sequential compression devices, early ambulation, and lovenox bridging to coumadin for DVT prophylaxis.  She benefited maximally from the hospital stay and there were no complications.  She was transfused a total of 4 units of prbcs for symptomatic anemia.  Recent vital signs:  Filed Vitals:   02/29/12 0727  BP: 135/80  Pulse: 85  Temp: 99.2 F (37.3 C)  Resp: 18    Recent laboratory studies:  Lab Results  Component Value Date   HGB 12.6 02/29/2012   HGB 8.9* 02/28/2012   HGB 6.9*  02/27/2012   Lab Results  Component Value Date   WBC 10.9* 02/29/2012   PLT 323 02/29/2012   Lab Results  Component Value Date   INR 2.29* 02/29/2012   Lab Results  Component Value Date   NA 138 02/29/2012   K 3.2* 02/29/2012   CL 96 02/29/2012   CO2 30 02/29/2012   BUN 8 02/29/2012   CREATININE 0.73 02/29/2012   GLUCOSE 186* 02/29/2012    Discharge Medications:     Medication List     As of 02/29/2012  2:07 PM    TAKE these medications         albuterol 108 (90 BASE) MCG/ACT inhaler   Commonly known as: PROVENTIL HFA;VENTOLIN HFA   Inhale 2 puffs into the lungs every 4 (four) hours as needed. For wheezing      atorvastatin 80 MG tablet   Commonly known as: LIPITOR   Take 80 mg by mouth daily.      azelastine 137 MCG/SPRAY nasal spray   Commonly known as: ASTELIN   Place 1 spray into the nose 2 (two) times daily. Use in each nostril as directed      beclomethasone 80 MCG/ACT inhaler   Commonly known as: QVAR   Inhale 2 puffs into the lungs 2 (two) times daily.      bisacodyl  5 MG EC tablet   Commonly known as: DULCOLAX   Take 1 tablet (5 mg total) by mouth daily as needed for constipation.      CALCIUM 600+D3 PO   Take 1 tablet by mouth daily.      metFORMIN 500 MG tablet   Commonly known as: GLUCOPHAGE   Take 500 mg by mouth daily with breakfast.      methocarbamol 500 MG tablet   Commonly known as: ROBAXIN   Take 1 tablet (500 mg total) by mouth 4 (four) times daily.      montelukast 10 MG tablet   Commonly known as: SINGULAIR   Take 10 mg by mouth at bedtime.      oxyCODONE-acetaminophen 10-325 MG per tablet   Commonly known as: PERCOCET   Take 1-2 tablets by mouth every 6 (six) hours as needed for pain. MAXIMUM TOTAL ACETAMINOPHEN DOSE IS 4000 MG PER DAY      promethazine 25 MG tablet   Commonly known as: PHENERGAN   Take 1 tablet (25 mg total) by mouth every 6 (six) hours as needed for nausea.      quinapril 10 MG tablet   Commonly known as: ACCUPRIL    Take 10 mg by mouth daily.      sennosides-docusate sodium 8.6-50 MG tablet   Commonly known as: SENOKOT-S   Take 1 tablet by mouth daily.      sitaGLIPtin 100 MG tablet   Commonly known as: JANUVIA   Take 100 mg by mouth daily.      warfarin 5 MG tablet   Commonly known as: COUMADIN   Take 1 tablet (5 mg total) by mouth daily.        Diagnostic Studies: Dg Chest 2 View  02/09/2012  *RADIOLOGY REPORT*  Clinical Data: Total knee arthroplasty  CHEST - 2 VIEW  Comparison: 08/22/2003  Findings: Hypoaeration results in mild vascular crowding.  Allowing for this, unchanged cardiomediastinal contours.  No pleural effusion or pneumothorax.  No confluent airspace opacity. Mild multilevel degenerative changes of the visualized spine.  No acute osseous change.  IMPRESSION: No radiographic evidence of acute cardiopulmonary process.   Original Report Authenticated By: Waneta Martins, M.D.    X-ray Knee Left Port  02/23/2012  *RADIOLOGY REPORT*  Clinical Data: Status post left knee arthroplasty.  PORTABLE LEFT KNEE - 1-2 VIEW  Comparison: None.  Findings: Two views obtained postoperatively show normal alignment following total knee arthroplasty.  No significant abnormalities are identified.  There is some gas in the soft tissues anteriorly.  IMPRESSION: Normal alignment status post left knee arthroplasty.   Original Report Authenticated By: Reola Calkins, M.D.     Disposition: Final discharge disposition not confirmed      Discharge Orders    Future Orders Please Complete By Expires   Diet general      Call MD / Call 911      Comments:   If you experience chest pain or shortness of breath, CALL 911 and be transported to the hospital emergency room.  If you develope a fever above 101 F, pus (white drainage) or increased drainage or redness at the wound, or calf pain, call your surgeon's office.   Discharge instructions      Comments:   Change dressing in 3 days and reapply fresh  dressing, unless you have a splint (half cast).  If you have a splint/cast, just leave in place until your follow-up appointment.    Keep wounds dry  for 3 weeks.  Leave steri-strips in place on skin.  Do not apply lotion or anything to the wound.   Constipation Prevention      Comments:   Drink plenty of fluids.  Prune juice may be helpful.  You may use a stool softener, such as Colace (over the counter) 100 mg twice a day.  Use MiraLax (over the counter) for constipation as needed.   TED hose      Comments:   Use stockings (TED hose) for 2 weeks on both leg(s).  You may remove them at night for sleeping.   Change dressing      Comments:   Change dressing in three days, then change the dressing daily with sterile 4 x 4 inch gauze dressing.  You may clean the incision with alcohol prior to redressing.   Do not put a pillow under the knee. Place it under the heel.         Follow-up Information    Follow up with Jazz Biddy P, MD. In 2 weeks.   Contact information:   [facility] [address] [place] [address] [phone]           Signed: Eulas Post 02/29/2012, 2:07 PM

## 2012-02-29 NOTE — Progress Notes (Signed)
Physical Therapy Note   02/29/12 1600  PT Visit Information  Last PT Received On 02/29/12  Assistance Needed +1  PT Time Calculation  PT Start Time 1450  PT Stop Time 1505  PT Time Calculation (min) 15 min  Subjective Data  Subjective Really wanting to go home  Patient Stated Goal To go home  Precautions  Precautions Knee  Required Braces or Orthoses Knee Immobilizer - Left  Restrictions  LLE Weight Bearing WBAT  Cognition  Overall Cognitive Status Appears within functional limits for tasks assessed/performed  Arousal/Alertness Awake/alert  Orientation Level Appears intact for tasks assessed  Behavior During Session [facility] for tasks performed  Transfers  Transfers Sit to Stand;Stand to Sit  Sit to Stand 5: Supervision;With upper extremity assist;From bed  Stand to Sit 5: Supervision;With upper extremity assist;To bed  Details for Transfer Assistance Cues for technique  Ambulation/Gait  Ambulation/Gait Assistance 5: Supervision  Ambulation Distance (Feet) 60 Feet  Assistive device Rolling walker  Ambulation/Gait Assistance Details Cues to activate L quad for stance stability  Stairs Yes  Stairs Assistance 4: Min assist  Stairs Assistance Details (indicate cue type and reason) Husband present and gave correct assistance; verbal and tactile cueing for technique with one rail and other side handheld assist  Stair Management Technique One rail Right;Forwards (with handheld assist provided by husband)  Number of Stairs 4   PT - End of Session  Equipment Utilized During Treatment Gait belt  Activity Tolerance Patient tolerated treatment well  Patient left with family/visitor present (in wheelchair, ready for dc)  Nurse Communication Mobility status  PT - Assessment/Plan  Comments on Treatment Session Overall managing mobility well; Stair training complete, OK for dc home from PT standpoint  PT Plan Discharge plan remains appropriate;Frequency remains appropriate  PT Frequency  7X/week  Follow Up Recommendations Home health PT  Equipment Recommended None recommended by PT  Acute Rehab PT Goals  Time For Goal Achievement 03/02/12  Potential to Achieve Goals Good  Pt will go Sit to Stand with modified independence;with upper extremity assist  PT Goal: Sit to Stand - Progress Progressing toward goal  Pt will go Stand to Sit with modified independence;with upper extremity assist  PT Goal: Stand to Sit - Progress Progressing toward goal  Pt will Ambulate 51 - 150 feet;with supervision;with least restrictive assistive device  PT Goal: Ambulate - Progress Progressing toward goal  Pt will Go Up / Down Stairs 3-5 stairs;with min assist;with least restrictive assistive device  PT Goal: Up/Down Stairs - Progress Met  PT General Charges  $$ ACUTE PT VISIT 1 Procedure  PT Treatments  $Gait Training 8-22 mins    [place], [place] [phone]

## 2012-03-01 DIAGNOSIS — Z471 Aftercare following joint replacement surgery: Secondary | ICD-10-CM | POA: Diagnosis not present

## 2012-03-01 DIAGNOSIS — I10 Essential (primary) hypertension: Secondary | ICD-10-CM | POA: Diagnosis not present

## 2012-03-01 DIAGNOSIS — R269 Unspecified abnormalities of gait and mobility: Secondary | ICD-10-CM | POA: Diagnosis not present

## 2012-03-01 DIAGNOSIS — D500 Iron deficiency anemia secondary to blood loss (chronic): Secondary | ICD-10-CM | POA: Diagnosis not present

## 2012-03-01 DIAGNOSIS — E119 Type 2 diabetes mellitus without complications: Secondary | ICD-10-CM | POA: Diagnosis not present

## 2012-03-01 DIAGNOSIS — G8918 Other acute postprocedural pain: Secondary | ICD-10-CM | POA: Diagnosis not present

## 2012-03-01 LAB — TYPE AND SCREEN
Unit division: 0
Unit division: 0

## 2012-03-01 LAB — GLUCOSE, CAPILLARY: Glucose-Capillary: 134 mg/dL — ABNORMAL HIGH (ref 70–99)

## 2012-03-02 DIAGNOSIS — R269 Unspecified abnormalities of gait and mobility: Secondary | ICD-10-CM | POA: Diagnosis not present

## 2012-03-02 DIAGNOSIS — G8918 Other acute postprocedural pain: Secondary | ICD-10-CM | POA: Diagnosis not present

## 2012-03-02 DIAGNOSIS — E119 Type 2 diabetes mellitus without complications: Secondary | ICD-10-CM | POA: Diagnosis not present

## 2012-03-02 DIAGNOSIS — I10 Essential (primary) hypertension: Secondary | ICD-10-CM | POA: Diagnosis not present

## 2012-03-02 DIAGNOSIS — Z471 Aftercare following joint replacement surgery: Secondary | ICD-10-CM | POA: Diagnosis not present

## 2012-03-02 DIAGNOSIS — D500 Iron deficiency anemia secondary to blood loss (chronic): Secondary | ICD-10-CM | POA: Diagnosis not present

## 2012-03-03 DIAGNOSIS — I10 Essential (primary) hypertension: Secondary | ICD-10-CM | POA: Diagnosis not present

## 2012-03-03 DIAGNOSIS — D500 Iron deficiency anemia secondary to blood loss (chronic): Secondary | ICD-10-CM | POA: Diagnosis not present

## 2012-03-03 DIAGNOSIS — G8918 Other acute postprocedural pain: Secondary | ICD-10-CM | POA: Diagnosis not present

## 2012-03-03 DIAGNOSIS — E119 Type 2 diabetes mellitus without complications: Secondary | ICD-10-CM | POA: Diagnosis not present

## 2012-03-03 DIAGNOSIS — Z471 Aftercare following joint replacement surgery: Secondary | ICD-10-CM | POA: Diagnosis not present

## 2012-03-03 DIAGNOSIS — R269 Unspecified abnormalities of gait and mobility: Secondary | ICD-10-CM | POA: Diagnosis not present

## 2012-03-04 DIAGNOSIS — R269 Unspecified abnormalities of gait and mobility: Secondary | ICD-10-CM | POA: Diagnosis not present

## 2012-03-04 DIAGNOSIS — E119 Type 2 diabetes mellitus without complications: Secondary | ICD-10-CM | POA: Diagnosis not present

## 2012-03-04 DIAGNOSIS — G8918 Other acute postprocedural pain: Secondary | ICD-10-CM | POA: Diagnosis not present

## 2012-03-04 DIAGNOSIS — I10 Essential (primary) hypertension: Secondary | ICD-10-CM | POA: Diagnosis not present

## 2012-03-04 DIAGNOSIS — D500 Iron deficiency anemia secondary to blood loss (chronic): Secondary | ICD-10-CM | POA: Diagnosis not present

## 2012-03-04 DIAGNOSIS — Z471 Aftercare following joint replacement surgery: Secondary | ICD-10-CM | POA: Diagnosis not present

## 2012-03-05 DIAGNOSIS — Z471 Aftercare following joint replacement surgery: Secondary | ICD-10-CM | POA: Diagnosis not present

## 2012-03-05 DIAGNOSIS — D500 Iron deficiency anemia secondary to blood loss (chronic): Secondary | ICD-10-CM | POA: Diagnosis not present

## 2012-03-05 DIAGNOSIS — E119 Type 2 diabetes mellitus without complications: Secondary | ICD-10-CM | POA: Diagnosis not present

## 2012-03-05 DIAGNOSIS — R269 Unspecified abnormalities of gait and mobility: Secondary | ICD-10-CM | POA: Diagnosis not present

## 2012-03-05 DIAGNOSIS — G8918 Other acute postprocedural pain: Secondary | ICD-10-CM | POA: Diagnosis not present

## 2012-03-05 DIAGNOSIS — I10 Essential (primary) hypertension: Secondary | ICD-10-CM | POA: Diagnosis not present

## 2012-03-08 DIAGNOSIS — R269 Unspecified abnormalities of gait and mobility: Secondary | ICD-10-CM | POA: Diagnosis not present

## 2012-03-08 DIAGNOSIS — G8918 Other acute postprocedural pain: Secondary | ICD-10-CM | POA: Diagnosis not present

## 2012-03-08 DIAGNOSIS — Z471 Aftercare following joint replacement surgery: Secondary | ICD-10-CM | POA: Diagnosis not present

## 2012-03-08 DIAGNOSIS — I10 Essential (primary) hypertension: Secondary | ICD-10-CM | POA: Diagnosis not present

## 2012-03-08 DIAGNOSIS — D500 Iron deficiency anemia secondary to blood loss (chronic): Secondary | ICD-10-CM | POA: Diagnosis not present

## 2012-03-08 DIAGNOSIS — Z96659 Presence of unspecified artificial knee joint: Secondary | ICD-10-CM | POA: Diagnosis not present

## 2012-03-08 DIAGNOSIS — E119 Type 2 diabetes mellitus without complications: Secondary | ICD-10-CM | POA: Diagnosis not present

## 2012-03-09 DIAGNOSIS — D500 Iron deficiency anemia secondary to blood loss (chronic): Secondary | ICD-10-CM | POA: Diagnosis not present

## 2012-03-09 DIAGNOSIS — G8918 Other acute postprocedural pain: Secondary | ICD-10-CM | POA: Diagnosis not present

## 2012-03-09 DIAGNOSIS — E119 Type 2 diabetes mellitus without complications: Secondary | ICD-10-CM | POA: Diagnosis not present

## 2012-03-09 DIAGNOSIS — Z471 Aftercare following joint replacement surgery: Secondary | ICD-10-CM | POA: Diagnosis not present

## 2012-03-09 DIAGNOSIS — R269 Unspecified abnormalities of gait and mobility: Secondary | ICD-10-CM | POA: Diagnosis not present

## 2012-03-09 DIAGNOSIS — I10 Essential (primary) hypertension: Secondary | ICD-10-CM | POA: Diagnosis not present

## 2012-03-10 DIAGNOSIS — D500 Iron deficiency anemia secondary to blood loss (chronic): Secondary | ICD-10-CM | POA: Diagnosis not present

## 2012-03-10 DIAGNOSIS — R269 Unspecified abnormalities of gait and mobility: Secondary | ICD-10-CM | POA: Diagnosis not present

## 2012-03-10 DIAGNOSIS — E119 Type 2 diabetes mellitus without complications: Secondary | ICD-10-CM | POA: Diagnosis not present

## 2012-03-10 DIAGNOSIS — I10 Essential (primary) hypertension: Secondary | ICD-10-CM | POA: Diagnosis not present

## 2012-03-10 DIAGNOSIS — Z471 Aftercare following joint replacement surgery: Secondary | ICD-10-CM | POA: Diagnosis not present

## 2012-03-10 DIAGNOSIS — G8918 Other acute postprocedural pain: Secondary | ICD-10-CM | POA: Diagnosis not present

## 2012-03-11 DIAGNOSIS — I10 Essential (primary) hypertension: Secondary | ICD-10-CM | POA: Diagnosis not present

## 2012-03-11 DIAGNOSIS — G8918 Other acute postprocedural pain: Secondary | ICD-10-CM | POA: Diagnosis not present

## 2012-03-11 DIAGNOSIS — R269 Unspecified abnormalities of gait and mobility: Secondary | ICD-10-CM | POA: Diagnosis not present

## 2012-03-11 DIAGNOSIS — Z471 Aftercare following joint replacement surgery: Secondary | ICD-10-CM | POA: Diagnosis not present

## 2012-03-11 DIAGNOSIS — E119 Type 2 diabetes mellitus without complications: Secondary | ICD-10-CM | POA: Diagnosis not present

## 2012-03-11 DIAGNOSIS — D500 Iron deficiency anemia secondary to blood loss (chronic): Secondary | ICD-10-CM | POA: Diagnosis not present

## 2012-03-12 DIAGNOSIS — Z471 Aftercare following joint replacement surgery: Secondary | ICD-10-CM | POA: Diagnosis not present

## 2012-03-12 DIAGNOSIS — E119 Type 2 diabetes mellitus without complications: Secondary | ICD-10-CM | POA: Diagnosis not present

## 2012-03-12 DIAGNOSIS — D500 Iron deficiency anemia secondary to blood loss (chronic): Secondary | ICD-10-CM | POA: Diagnosis not present

## 2012-03-12 DIAGNOSIS — G8918 Other acute postprocedural pain: Secondary | ICD-10-CM | POA: Diagnosis not present

## 2012-03-12 DIAGNOSIS — I10 Essential (primary) hypertension: Secondary | ICD-10-CM | POA: Diagnosis not present

## 2012-03-12 DIAGNOSIS — R269 Unspecified abnormalities of gait and mobility: Secondary | ICD-10-CM | POA: Diagnosis not present

## 2012-03-15 DIAGNOSIS — E119 Type 2 diabetes mellitus without complications: Secondary | ICD-10-CM | POA: Diagnosis not present

## 2012-03-15 DIAGNOSIS — G8918 Other acute postprocedural pain: Secondary | ICD-10-CM | POA: Diagnosis not present

## 2012-03-15 DIAGNOSIS — D500 Iron deficiency anemia secondary to blood loss (chronic): Secondary | ICD-10-CM | POA: Diagnosis not present

## 2012-03-15 DIAGNOSIS — R269 Unspecified abnormalities of gait and mobility: Secondary | ICD-10-CM | POA: Diagnosis not present

## 2012-03-15 DIAGNOSIS — Z471 Aftercare following joint replacement surgery: Secondary | ICD-10-CM | POA: Diagnosis not present

## 2012-03-15 DIAGNOSIS — I10 Essential (primary) hypertension: Secondary | ICD-10-CM | POA: Diagnosis not present

## 2012-03-17 DIAGNOSIS — D500 Iron deficiency anemia secondary to blood loss (chronic): Secondary | ICD-10-CM | POA: Diagnosis not present

## 2012-03-17 DIAGNOSIS — E119 Type 2 diabetes mellitus without complications: Secondary | ICD-10-CM | POA: Diagnosis not present

## 2012-03-17 DIAGNOSIS — R269 Unspecified abnormalities of gait and mobility: Secondary | ICD-10-CM | POA: Diagnosis not present

## 2012-03-17 DIAGNOSIS — G8918 Other acute postprocedural pain: Secondary | ICD-10-CM | POA: Diagnosis not present

## 2012-03-17 DIAGNOSIS — Z471 Aftercare following joint replacement surgery: Secondary | ICD-10-CM | POA: Diagnosis not present

## 2012-03-17 DIAGNOSIS — I10 Essential (primary) hypertension: Secondary | ICD-10-CM | POA: Diagnosis not present

## 2012-03-18 DIAGNOSIS — D500 Iron deficiency anemia secondary to blood loss (chronic): Secondary | ICD-10-CM | POA: Diagnosis not present

## 2012-03-18 DIAGNOSIS — R269 Unspecified abnormalities of gait and mobility: Secondary | ICD-10-CM | POA: Diagnosis not present

## 2012-03-18 DIAGNOSIS — I10 Essential (primary) hypertension: Secondary | ICD-10-CM | POA: Diagnosis not present

## 2012-03-18 DIAGNOSIS — G8918 Other acute postprocedural pain: Secondary | ICD-10-CM | POA: Diagnosis not present

## 2012-03-18 DIAGNOSIS — E119 Type 2 diabetes mellitus without complications: Secondary | ICD-10-CM | POA: Diagnosis not present

## 2012-03-18 DIAGNOSIS — Z471 Aftercare following joint replacement surgery: Secondary | ICD-10-CM | POA: Diagnosis not present

## 2012-03-19 DIAGNOSIS — Z471 Aftercare following joint replacement surgery: Secondary | ICD-10-CM | POA: Diagnosis not present

## 2012-03-19 DIAGNOSIS — R269 Unspecified abnormalities of gait and mobility: Secondary | ICD-10-CM | POA: Diagnosis not present

## 2012-03-19 DIAGNOSIS — D500 Iron deficiency anemia secondary to blood loss (chronic): Secondary | ICD-10-CM | POA: Diagnosis not present

## 2012-03-19 DIAGNOSIS — E119 Type 2 diabetes mellitus without complications: Secondary | ICD-10-CM | POA: Diagnosis not present

## 2012-03-19 DIAGNOSIS — G8918 Other acute postprocedural pain: Secondary | ICD-10-CM | POA: Diagnosis not present

## 2012-03-19 DIAGNOSIS — I10 Essential (primary) hypertension: Secondary | ICD-10-CM | POA: Diagnosis not present

## 2012-03-22 DIAGNOSIS — R269 Unspecified abnormalities of gait and mobility: Secondary | ICD-10-CM | POA: Diagnosis not present

## 2012-03-22 DIAGNOSIS — D500 Iron deficiency anemia secondary to blood loss (chronic): Secondary | ICD-10-CM | POA: Diagnosis not present

## 2012-03-22 DIAGNOSIS — Z471 Aftercare following joint replacement surgery: Secondary | ICD-10-CM | POA: Diagnosis not present

## 2012-03-22 DIAGNOSIS — G8918 Other acute postprocedural pain: Secondary | ICD-10-CM | POA: Diagnosis not present

## 2012-03-22 DIAGNOSIS — E119 Type 2 diabetes mellitus without complications: Secondary | ICD-10-CM | POA: Diagnosis not present

## 2012-03-22 DIAGNOSIS — I10 Essential (primary) hypertension: Secondary | ICD-10-CM | POA: Diagnosis not present

## 2012-03-23 DIAGNOSIS — E119 Type 2 diabetes mellitus without complications: Secondary | ICD-10-CM | POA: Diagnosis not present

## 2012-03-23 DIAGNOSIS — I10 Essential (primary) hypertension: Secondary | ICD-10-CM | POA: Diagnosis not present

## 2012-03-23 DIAGNOSIS — Z471 Aftercare following joint replacement surgery: Secondary | ICD-10-CM | POA: Diagnosis not present

## 2012-03-23 DIAGNOSIS — R269 Unspecified abnormalities of gait and mobility: Secondary | ICD-10-CM | POA: Diagnosis not present

## 2012-03-23 DIAGNOSIS — D500 Iron deficiency anemia secondary to blood loss (chronic): Secondary | ICD-10-CM | POA: Diagnosis not present

## 2012-03-23 DIAGNOSIS — G8918 Other acute postprocedural pain: Secondary | ICD-10-CM | POA: Diagnosis not present

## 2012-03-24 DIAGNOSIS — E119 Type 2 diabetes mellitus without complications: Secondary | ICD-10-CM | POA: Diagnosis not present

## 2012-03-24 DIAGNOSIS — G8918 Other acute postprocedural pain: Secondary | ICD-10-CM | POA: Diagnosis not present

## 2012-03-24 DIAGNOSIS — Z471 Aftercare following joint replacement surgery: Secondary | ICD-10-CM | POA: Diagnosis not present

## 2012-03-24 DIAGNOSIS — D500 Iron deficiency anemia secondary to blood loss (chronic): Secondary | ICD-10-CM | POA: Diagnosis not present

## 2012-03-24 DIAGNOSIS — R269 Unspecified abnormalities of gait and mobility: Secondary | ICD-10-CM | POA: Diagnosis not present

## 2012-03-24 DIAGNOSIS — I10 Essential (primary) hypertension: Secondary | ICD-10-CM | POA: Diagnosis not present

## 2012-03-26 DIAGNOSIS — G8918 Other acute postprocedural pain: Secondary | ICD-10-CM | POA: Diagnosis not present

## 2012-03-26 DIAGNOSIS — D500 Iron deficiency anemia secondary to blood loss (chronic): Secondary | ICD-10-CM | POA: Diagnosis not present

## 2012-03-26 DIAGNOSIS — I10 Essential (primary) hypertension: Secondary | ICD-10-CM | POA: Diagnosis not present

## 2012-03-26 DIAGNOSIS — E119 Type 2 diabetes mellitus without complications: Secondary | ICD-10-CM | POA: Diagnosis not present

## 2012-03-26 DIAGNOSIS — Z471 Aftercare following joint replacement surgery: Secondary | ICD-10-CM | POA: Diagnosis not present

## 2012-03-26 DIAGNOSIS — R269 Unspecified abnormalities of gait and mobility: Secondary | ICD-10-CM | POA: Diagnosis not present

## 2012-03-29 DIAGNOSIS — E119 Type 2 diabetes mellitus without complications: Secondary | ICD-10-CM | POA: Diagnosis not present

## 2012-03-29 DIAGNOSIS — Z471 Aftercare following joint replacement surgery: Secondary | ICD-10-CM | POA: Diagnosis not present

## 2012-03-29 DIAGNOSIS — I10 Essential (primary) hypertension: Secondary | ICD-10-CM | POA: Diagnosis not present

## 2012-03-29 DIAGNOSIS — D500 Iron deficiency anemia secondary to blood loss (chronic): Secondary | ICD-10-CM | POA: Diagnosis not present

## 2012-03-29 DIAGNOSIS — R269 Unspecified abnormalities of gait and mobility: Secondary | ICD-10-CM | POA: Diagnosis not present

## 2012-03-29 DIAGNOSIS — G8918 Other acute postprocedural pain: Secondary | ICD-10-CM | POA: Diagnosis not present

## 2012-03-31 DIAGNOSIS — R269 Unspecified abnormalities of gait and mobility: Secondary | ICD-10-CM | POA: Diagnosis not present

## 2012-03-31 DIAGNOSIS — E119 Type 2 diabetes mellitus without complications: Secondary | ICD-10-CM | POA: Diagnosis not present

## 2012-03-31 DIAGNOSIS — D500 Iron deficiency anemia secondary to blood loss (chronic): Secondary | ICD-10-CM | POA: Diagnosis not present

## 2012-03-31 DIAGNOSIS — I10 Essential (primary) hypertension: Secondary | ICD-10-CM | POA: Diagnosis not present

## 2012-03-31 DIAGNOSIS — G8918 Other acute postprocedural pain: Secondary | ICD-10-CM | POA: Diagnosis not present

## 2012-03-31 DIAGNOSIS — Z471 Aftercare following joint replacement surgery: Secondary | ICD-10-CM | POA: Diagnosis not present

## 2012-04-02 DIAGNOSIS — G8918 Other acute postprocedural pain: Secondary | ICD-10-CM | POA: Diagnosis not present

## 2012-04-02 DIAGNOSIS — R269 Unspecified abnormalities of gait and mobility: Secondary | ICD-10-CM | POA: Diagnosis not present

## 2012-04-02 DIAGNOSIS — E119 Type 2 diabetes mellitus without complications: Secondary | ICD-10-CM | POA: Diagnosis not present

## 2012-04-02 DIAGNOSIS — D500 Iron deficiency anemia secondary to blood loss (chronic): Secondary | ICD-10-CM | POA: Diagnosis not present

## 2012-04-02 DIAGNOSIS — Z471 Aftercare following joint replacement surgery: Secondary | ICD-10-CM | POA: Diagnosis not present

## 2012-04-02 DIAGNOSIS — I10 Essential (primary) hypertension: Secondary | ICD-10-CM | POA: Diagnosis not present

## 2012-04-06 NOTE — Progress Notes (Signed)
To come in for labs-had total knee 9 13

## 2012-04-07 ENCOUNTER — Encounter (HOSPITAL_BASED_OUTPATIENT_CLINIC_OR_DEPARTMENT_OTHER)
Admission: RE | Admit: 2012-04-07 | Discharge: 2012-04-07 | Disposition: A | Discharge status: Home / Self Care | Payer: Medicare Other | Source: Ambulatory Visit | Attending: Orthopedic Surgery | Admitting: Orthopedic Surgery

## 2012-04-07 DIAGNOSIS — J45909 Unspecified asthma, uncomplicated: Secondary | ICD-10-CM | POA: Diagnosis not present

## 2012-04-07 DIAGNOSIS — Z01812 Encounter for preprocedural laboratory examination: Secondary | ICD-10-CM | POA: Diagnosis not present

## 2012-04-07 DIAGNOSIS — E119 Type 2 diabetes mellitus without complications: Secondary | ICD-10-CM | POA: Diagnosis not present

## 2012-04-07 DIAGNOSIS — M24569 Contracture, unspecified knee: Secondary | ICD-10-CM | POA: Diagnosis not present

## 2012-04-07 DIAGNOSIS — I10 Essential (primary) hypertension: Secondary | ICD-10-CM | POA: Diagnosis not present

## 2012-04-07 DIAGNOSIS — M1710 Unilateral primary osteoarthritis, unspecified knee: Secondary | ICD-10-CM | POA: Diagnosis not present

## 2012-04-07 DIAGNOSIS — Z981 Arthrodesis status: Secondary | ICD-10-CM | POA: Diagnosis not present

## 2012-04-07 LAB — BASIC METABOLIC PANEL
CO2: 29 mEq/L (ref 19–32)
Calcium: 10.7 mg/dL — ABNORMAL HIGH (ref 8.4–10.5)
Chloride: 101 mEq/L (ref 96–112)
Creatinine, Ser: 0.7 mg/dL (ref 0.50–1.10)
Glucose, Bld: 124 mg/dL — ABNORMAL HIGH (ref 70–99)

## 2012-04-07 LAB — PROTIME-INR: INR: 1.06 (ref 0.00–1.49)

## 2012-04-08 ENCOUNTER — Other Ambulatory Visit: Payer: Self-pay | Admitting: Orthopedic Surgery

## 2012-04-09 ENCOUNTER — Encounter (HOSPITAL_BASED_OUTPATIENT_CLINIC_OR_DEPARTMENT_OTHER)
Admission: RE | Disposition: A | Discharge status: Home / Self Care | Payer: Self-pay | Source: Ambulatory Visit | Attending: Orthopedic Surgery

## 2012-04-09 ENCOUNTER — Encounter (HOSPITAL_BASED_OUTPATIENT_CLINIC_OR_DEPARTMENT_OTHER): Payer: Self-pay | Admitting: Orthopedic Surgery

## 2012-04-09 ENCOUNTER — Ambulatory Visit (HOSPITAL_BASED_OUTPATIENT_CLINIC_OR_DEPARTMENT_OTHER): Payer: Medicare Other | Admitting: Anesthesiology

## 2012-04-09 ENCOUNTER — Ambulatory Visit (HOSPITAL_BASED_OUTPATIENT_CLINIC_OR_DEPARTMENT_OTHER)
Admission: RE | Admit: 2012-04-09 | Discharge: 2012-04-09 | Disposition: A | Discharge status: Home / Self Care | Payer: Medicare Other | Source: Ambulatory Visit | Attending: Orthopedic Surgery | Admitting: Orthopedic Surgery

## 2012-04-09 ENCOUNTER — Encounter (HOSPITAL_BASED_OUTPATIENT_CLINIC_OR_DEPARTMENT_OTHER): Payer: Self-pay | Admitting: Anesthesiology

## 2012-04-09 ENCOUNTER — Encounter (HOSPITAL_BASED_OUTPATIENT_CLINIC_OR_DEPARTMENT_OTHER): Payer: Self-pay | Admitting: *Deleted

## 2012-04-09 DIAGNOSIS — Z01812 Encounter for preprocedural laboratory examination: Secondary | ICD-10-CM | POA: Insufficient documentation

## 2012-04-09 DIAGNOSIS — M24569 Contracture, unspecified knee: Secondary | ICD-10-CM | POA: Diagnosis not present

## 2012-04-09 DIAGNOSIS — J45909 Unspecified asthma, uncomplicated: Secondary | ICD-10-CM | POA: Insufficient documentation

## 2012-04-09 DIAGNOSIS — Z981 Arthrodesis status: Secondary | ICD-10-CM | POA: Insufficient documentation

## 2012-04-09 DIAGNOSIS — E119 Type 2 diabetes mellitus without complications: Secondary | ICD-10-CM | POA: Diagnosis not present

## 2012-04-09 DIAGNOSIS — M1710 Unilateral primary osteoarthritis, unspecified knee: Secondary | ICD-10-CM | POA: Insufficient documentation

## 2012-04-09 DIAGNOSIS — I10 Essential (primary) hypertension: Secondary | ICD-10-CM | POA: Diagnosis not present

## 2012-04-09 DIAGNOSIS — M24562 Contracture, left knee: Secondary | ICD-10-CM | POA: Diagnosis present

## 2012-04-09 HISTORY — PX: INJECTION KNEE: SHX2446

## 2012-04-09 HISTORY — PX: KNEE CLOSED REDUCTION: SHX995

## 2012-04-09 LAB — POCT HEMOGLOBIN-HEMACUE: Hemoglobin: 11.7 g/dL — ABNORMAL LOW (ref 12.0–15.0)

## 2012-04-09 SURGERY — MANIPULATION, KNEE, CLOSED
Anesthesia: General | Site: Knee | Laterality: Left | Wound class: Clean

## 2012-04-09 MED ORDER — OXYCODONE-ACETAMINOPHEN 10-325 MG PO TABS: 1.0000 | ORAL_TABLET | Freq: Four times a day (QID) | ORAL | Status: DC | PRN

## 2012-04-09 MED ORDER — CEFAZOLIN SODIUM-DEXTROSE 2-3 GM-% IV SOLR
2.0000 g | INTRAVENOUS | Status: AC
  Administered 2012-04-09: 2 g via INTRAVENOUS

## 2012-04-09 MED ORDER — OXYCODONE HCL 5 MG/5ML PO SOLN: 5.0000 mg | Freq: Once | ORAL | Status: DC | PRN

## 2012-04-09 MED ORDER — PROMETHAZINE HCL 25 MG/ML IJ SOLN: 6.2500 mg | INTRAMUSCULAR | Status: DC | PRN

## 2012-04-09 MED ORDER — FENTANYL CITRATE 0.05 MG/ML IJ SOLN: 50.0000 ug | Freq: Once | INTRAMUSCULAR | Status: DC

## 2012-04-09 MED ORDER — MIDAZOLAM HCL 5 MG/5ML IJ SOLN
INTRAMUSCULAR | Status: DC | PRN
  Administered 2012-04-09: 2 mg via INTRAVENOUS

## 2012-04-09 MED ORDER — LIDOCAINE HCL (CARDIAC) 20 MG/ML IV SOLN
INTRAVENOUS | Status: DC | PRN
  Administered 2012-04-09: 60 mg via INTRAVENOUS

## 2012-04-09 MED ORDER — BUPIVACAINE HCL (PF) 0.5 % IJ SOLN
INTRAMUSCULAR | Status: DC | PRN
  Administered 2012-04-09: 10 mL

## 2012-04-09 MED ORDER — OXYCODONE HCL 5 MG PO TABS: 5.0000 mg | ORAL_TABLET | Freq: Once | ORAL | Status: DC | PRN

## 2012-04-09 MED ORDER — LACTATED RINGERS IV SOLN
INTRAVENOUS | Status: DC
  Administered 2012-04-09: 10:00:00 via INTRAVENOUS

## 2012-04-09 MED ORDER — FENTANYL CITRATE 0.05 MG/ML IJ SOLN
INTRAMUSCULAR | Status: DC | PRN
  Administered 2012-04-09: 100 ug via INTRAVENOUS

## 2012-04-09 MED ORDER — PROPOFOL 10 MG/ML IV BOLUS
INTRAVENOUS | Status: DC | PRN
  Administered 2012-04-09: 180 mg via INTRAVENOUS

## 2012-04-09 MED ORDER — ONDANSETRON HCL 4 MG/2ML IJ SOLN
INTRAMUSCULAR | Status: DC | PRN
  Administered 2012-04-09: 4 mg via INTRAVENOUS

## 2012-04-09 MED ORDER — MIDAZOLAM HCL 2 MG/2ML IJ SOLN: 1.0000 mg | INTRAMUSCULAR | Status: DC | PRN

## 2012-04-09 MED ORDER — HYDROMORPHONE HCL PF 1 MG/ML IJ SOLN
0.2500 mg | INTRAMUSCULAR | Status: DC | PRN
  Administered 2012-04-09 (×3): 0.5 mg via INTRAVENOUS

## 2012-04-09 SURGICAL SUPPLY — 15 items
BANDAGE ELASTIC 4 VELCRO ST LF (GAUZE/BANDAGES/DRESSINGS) ×1 IMPLANT
CLOTH BEACON ORANGE TIMEOUT ST (SAFETY) ×2 IMPLANT
DRSG PAD ABDOMINAL 8X10 ST (GAUZE/BANDAGES/DRESSINGS) ×1 IMPLANT
GLOVE BIOGEL PI IND STRL 8 (GLOVE) ×1 IMPLANT
GLOVE BIOGEL PI INDICATOR 8 (GLOVE) ×1
GLOVE ORTHO TXT STRL SZ7.5 (GLOVE) ×2 IMPLANT
KNEE WRAP E Z 3 GEL PACK (MISCELLANEOUS) ×1 IMPLANT
NDL SAFETY ECLIPSE 18X1.5 (NEEDLE) ×1 IMPLANT
NDL SPNL 22GX3.5 QUINCKE BK (NEEDLE) ×1 IMPLANT
NEEDLE HYPO 18GX1.5 SHARP (NEEDLE) ×4
NEEDLE SPNL 22GX3.5 QUINCKE BK (NEEDLE) ×2 IMPLANT
PAD ALCOHOL SWAB (MISCELLANEOUS) ×4 IMPLANT
STRIP CLOSURE SKIN 1/2X4 (GAUZE/BANDAGES/DRESSINGS) ×1 IMPLANT
SYR 50ML LL SCALE MARK (SYRINGE) ×1 IMPLANT
SYR CONTROL 10ML LL (SYRINGE) ×1 IMPLANT

## 2012-04-09 NOTE — Transfer of Care (Signed)
Immediate Anesthesia Transfer of Care Note  Patient: Tabitha Oneal  Procedure(s) Performed: Procedure(s) (LRB) with comments: CLOSED MANIPULATION KNEE (Left) - Manipulation Knee with Anesthesia includes Application of Traction  KNEE INJECTION (Left)  Patient Location: PACU  Anesthesia Type:General  Level of Consciousness: awake  Airway & Oxygen Therapy: Patient Spontanous Breathing and Patient connected to face mask oxygen  Post-op Assessment: Report given to PACU RN and Post -op Vital signs reviewed and stable  Post vital signs: Reviewed and stable  Complications: No apparent anesthesia complications

## 2012-04-09 NOTE — Op Note (Signed)
04/09/2012  11:08 AM  PATIENT:  Tabitha Oneal    PRE-OPERATIVE DIAGNOSIS:  left knee contracture lower leg   POST-OPERATIVE DIAGNOSIS:  Same  PROCEDURE:  CLOSED MANIPULATION KNEE, knee aspiration  SURGEON:  Eulas Post, MD  PHYSICIAN ASSISTANT: Janace Litten, OPA-C, present and scrubbed throughout the case, critical for completion in a timely fashion, and for retraction, instrumentation, and closure.  ANESTHESIA:   General  PREOPERATIVE INDICATIONS:  Tabitha Oneal is a  56 y.o. female who has had a long history of left knee problems. She had a lateral release done years ago, and subsequent to this had almost no knee function. She also had very limited function even prior to this. She had significant grade 4 chondral loss underneath the patella. She ultimately underwent total knee replacement. After the surgery she failed to regain any significant flexion, and elected for manipulation under anesthesia.  The risks benefits and alternatives were discussed with the patient preoperatively including but not limited to the risks of infection, bleeding, nerve injury, cardiopulmonary complications, the need for revision surgery, among others, and the patient was willing to proceed. We also discussed the risks of fracture, recurrent stiffness, ongoing loss of function.  OPERATIVE IMPLANTS: None  OPERATIVE FINDINGS: Preoperative range of motion was from 0 to about 40. Postoperatively she was able to passively dropped to 100, and with application of light force, able to bend to 130.  OPERATIVE PROCEDURE: The patient is brought to the operating room and placed in the supine position. General anesthesia was administered. Time out was performed. The left superolateral portal was prepped with Betadine. An 18-gauge needle was used to enter the joint. I attempted aspiration, however there was no fluid. I aspirated this using sterile technique. I did this because I was concerned because of  her arthrofibrosis, which can sometimes reflect a cold infection. Nonetheless I did not appreciate any evidence for effusion, and there was certainly no purulence in the joint. She also has had a small area over the anterior patella, which had some hypertrophic granulation tissue that still has some slight fluid, however this dried up with a silver nitrate application in the office.  Having achieved a very dry tap, I removed the 18-gauge needle, and then manipulated the knee. The knee was fairly stiff, and I did not yield significant lysis of adhesions, but certainly regain more motion. I suspect that her fusions had not strongly formed yet, given the fact that I was able to regain motion without large loud audible popping.  The patella was then much more mobile, and the knee easily dropped to 100, and was stable to varus and valgus stress. I injected the knee after prepping again with Betadine using 10 cc of half percent Marcaine. Steri-Strips were applied to her skin, over the anterior patella, and the wounds dressed with gauze and a ice pack applied. She will plan to use her CPM, and will hopefully be able to regain some degree of motion and quadriceps function, although her extensor function for years now has been abysmal.

## 2012-04-09 NOTE — Anesthesia Preprocedure Evaluation (Signed)
Anesthesia Evaluation  Patient identified by MRN, date of birth, ID band Patient awake    Reviewed: Allergy & Precautions, H&P , NPO status , Patient's Chart, lab work & pertinent test results  Airway Mallampati: II      Dental  (+) Edentulous Upper and Edentulous Lower   Pulmonary asthma ,  breath sounds clear to auscultation        Cardiovascular hypertension, Rhythm:Regular Rate:Normal     Neuro/Psych    GI/Hepatic   Endo/Other  diabetes  Renal/GU      Musculoskeletal   Abdominal (+) - obese,   Peds  Hematology   Anesthesia Other Findings   Reproductive/Obstetrics                           Anesthesia Physical Anesthesia Plan  ASA: III  Anesthesia Plan: General   Post-op Pain Management:    Induction: Intravenous  Airway Management Planned: LMA and Mask  Additional Equipment:   Intra-op Plan:   Post-operative Plan: Extubation in OR  Informed Consent: I have reviewed the patients History and Physical, chart, labs and discussed the procedure including the risks, benefits and alternatives for the proposed anesthesia with the patient or authorized representative who has indicated his/her understanding and acceptance.     Plan Discussed with: CRNA and Surgeon  Anesthesia Plan Comments:         Anesthesia Quick Evaluation

## 2012-04-09 NOTE — Anesthesia Postprocedure Evaluation (Signed)
  Anesthesia Post-op Note  Patient: Tabitha Oneal  Procedure(s) Performed: Procedure(s) (LRB) with comments: CLOSED MANIPULATION KNEE (Left) - Manipulation Knee with Anesthesia includes Application of Traction  KNEE INJECTION (Left)  Patient Location: PACU  Anesthesia Type:General  Level of Consciousness: awake  Airway and Oxygen Therapy: Patient Spontanous Breathing  Post-op Pain: mild  Post-op Assessment: Post-op Vital signs reviewed, Patient's Cardiovascular Status Stable, Respiratory Function Stable, Patent Airway, No signs of Nausea or vomiting and Pain level controlled  Post-op Vital Signs: stable  Complications: No apparent anesthesia complications

## 2012-04-09 NOTE — H&P (Signed)
PREOPERATIVE H&P  Chief Complaint: left knee arthrofibrosis  HPI: Tabitha Oneal is a 56 y.o. 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 Symptoms are rated as moderate to severe, and have been worsening.  This is significantly impairing activities of daily living.  She has elected for surgical management.  She has worked with therapy, but had very little progress.  Past Medical History  Diagnosis Date  . Diabetes mellitus   . Hypertension   . Asthma   . Osteoarthritis of left knee 02/23/2012   Past Surgical History  Procedure Date  . Joint replacement     acl   bil knees  . Abdominal hysterectomy   . Total knee arthroplasty 02/23/2012    Procedure: TOTAL KNEE ARTHROPLASTY;  Surgeon: Eulas Post, MD;  Location: [facility] OR;  Service: Orthopedics;  Laterality: Left;   History   Social History  . Marital Status: Married    Spouse Name: N/A    Number of Children: N/A  . Years of Education: N/A   Social History Main Topics  . Smoking status: Never Smoker   . Smokeless tobacco: Not on file  . Alcohol Use: No  . Drug Use: No  . Sexually Active:    Other Topics Concern  . Not on file   Social History Narrative  . No narrative on file   No family history on file. No Known Allergies Prior to Admission medications   Medication Sig Start Date End Date Taking? Authorizing Provider  albuterol (PROVENTIL HFA;VENTOLIN HFA) 108 (90 BASE) MCG/ACT inhaler Inhale 2 puffs into the lungs every 4 (four) hours as needed. For wheezing    Historical Provider, MD  atorvastatin (LIPITOR) 80 MG tablet Take 80 mg by mouth daily.    Historical Provider, MD  azelastine (ASTELIN) 137 MCG/SPRAY nasal spray Place 1 spray into the nose 2 (two) times daily. Use in each nostril as directed    Historical Provider, MD  beclomethasone (QVAR) 80 MCG/ACT inhaler Inhale 2 puffs into  the lungs 2 (two) times daily.    Historical Provider, MD  bisacodyl (DULCOLAX) 5 MG EC tablet Take 1 tablet (5 mg total) by mouth daily as needed for constipation. 02/23/12   Eulas Post, MD  Calcium Carbonate-Vitamin D (CALCIUM 600+D3 PO) Take 1 tablet by mouth daily.    Historical Provider, MD  metFORMIN (GLUCOPHAGE) 500 MG tablet Take 500 mg by mouth daily with breakfast.    Historical Provider, MD  methocarbamol (ROBAXIN) 500 MG tablet Take 1 tablet (500 mg total) by mouth 4 (four) times daily. 02/23/12   Eulas Post, MD  montelukast (SINGULAIR) 10 MG tablet Take 10 mg by mouth at bedtime.    Historical Provider, MD  oxyCODONE-acetaminophen (PERCOCET) 10-325 MG per tablet Take 1-2 tablets by mouth every 6 (six) hours as needed for pain. MAXIMUM TOTAL ACETAMINOPHEN DOSE IS 4000 MG PER DAY 02/23/12   Eulas Post, MD  promethazine (PHENERGAN) 25 MG tablet Take 1 tablet (25 mg total) by mouth every 6 (six) hours as needed for nausea. 02/23/12   Eulas Post, MD  quinapril (ACCUPRIL) 10 MG tablet Take 10 mg by mouth daily.    Historical Provider, MD  sennosides-docusate sodium (SENOKOT-S) 8.6-50 MG tablet Take 1 tablet by mouth daily. 02/23/12   Eulas Post, MD  sitaGLIPtin (JANUVIA) 100 MG tablet Take 100 mg by mouth daily.  Historical Provider, MD  warfarin (COUMADIN) 5 MG tablet Take 1 tablet (5 mg total) by mouth daily. 02/23/12   Eulas Post, MD     Positive ROS: All other systems have been reviewed and were otherwise negative with the exception of those mentioned in the HPI and as above.  Physical Exam: General: Alert, no acute distress Cardiovascular: No pedal edema Respiratory: No cyanosis, no use of accessory musculature GI: No organomegaly, abdomen is soft and non-tender Skin: No lesions in the area of chief complaint Neurologic: Sensation intact distally Psychiatric: Patient is competent for consent with normal mood and affect Lymphatic: No axillary or cervical  lymphadenopathy  MUSCULOSKELETAL: Left knee range of motion is from 5 to 40. Surgical wounds appear clean.  Assessment: left knee  arthrofibrosis, status post total knee replacement, with long-standing quadriceps dysfunction  Plan: Plan for Procedure(s): CLOSED MANIPULATION KNEE  The risks benefits and alternatives were discussed with the patient including but not limited to the risks of nonoperative treatment, versus surgical intervention including infection, bleeding, nerve injury,  blood clots, cardiopulmonary complications, morbidity, mortality, among others, and they were willing to proceed.  we also discussed the risks of fracture, incomplete relief of motion, persistent quadriceps dysfunction, among others.   Eulas Post, MD Cell [phone] Pager [phone]  04/09/2012 7:29 AM  '

## 2012-04-09 NOTE — Progress Notes (Signed)
Pt ordered to use CPM by Dr Dion Saucier Pt states that her last CPM (used in September) was picked up New CPM to be ordered by Tresa Endo at Dr Shelba Flake office

## 2012-04-12 ENCOUNTER — Encounter (HOSPITAL_BASED_OUTPATIENT_CLINIC_OR_DEPARTMENT_OTHER): Payer: Self-pay | Admitting: Orthopedic Surgery

## 2012-04-12 DIAGNOSIS — M25569 Pain in unspecified knee: Secondary | ICD-10-CM | POA: Diagnosis not present

## 2012-04-12 DIAGNOSIS — M1710 Unilateral primary osteoarthritis, unspecified knee: Secondary | ICD-10-CM | POA: Diagnosis not present

## 2012-04-12 DIAGNOSIS — Z96659 Presence of unspecified artificial knee joint: Secondary | ICD-10-CM | POA: Diagnosis not present

## 2012-04-13 DIAGNOSIS — Z96659 Presence of unspecified artificial knee joint: Secondary | ICD-10-CM | POA: Diagnosis not present

## 2012-04-13 DIAGNOSIS — M25569 Pain in unspecified knee: Secondary | ICD-10-CM | POA: Diagnosis not present

## 2012-04-13 DIAGNOSIS — M1710 Unilateral primary osteoarthritis, unspecified knee: Secondary | ICD-10-CM | POA: Diagnosis not present

## 2012-04-14 DIAGNOSIS — M25569 Pain in unspecified knee: Secondary | ICD-10-CM | POA: Diagnosis not present

## 2012-04-14 DIAGNOSIS — M1710 Unilateral primary osteoarthritis, unspecified knee: Secondary | ICD-10-CM | POA: Diagnosis not present

## 2012-04-14 DIAGNOSIS — M24569 Contracture, unspecified knee: Secondary | ICD-10-CM | POA: Diagnosis not present

## 2012-04-14 DIAGNOSIS — Z96659 Presence of unspecified artificial knee joint: Secondary | ICD-10-CM | POA: Diagnosis not present

## 2012-04-15 DIAGNOSIS — M1710 Unilateral primary osteoarthritis, unspecified knee: Secondary | ICD-10-CM | POA: Diagnosis not present

## 2012-04-15 DIAGNOSIS — Z96659 Presence of unspecified artificial knee joint: Secondary | ICD-10-CM | POA: Diagnosis not present

## 2012-04-15 DIAGNOSIS — M24569 Contracture, unspecified knee: Secondary | ICD-10-CM | POA: Diagnosis not present

## 2012-04-15 DIAGNOSIS — M25569 Pain in unspecified knee: Secondary | ICD-10-CM | POA: Diagnosis not present

## 2012-04-16 DIAGNOSIS — Z96659 Presence of unspecified artificial knee joint: Secondary | ICD-10-CM | POA: Diagnosis not present

## 2012-04-16 DIAGNOSIS — M24569 Contracture, unspecified knee: Secondary | ICD-10-CM | POA: Diagnosis not present

## 2012-04-16 DIAGNOSIS — M25569 Pain in unspecified knee: Secondary | ICD-10-CM | POA: Diagnosis not present

## 2012-04-16 DIAGNOSIS — M1710 Unilateral primary osteoarthritis, unspecified knee: Secondary | ICD-10-CM | POA: Diagnosis not present

## 2012-04-19 DIAGNOSIS — Z96659 Presence of unspecified artificial knee joint: Secondary | ICD-10-CM | POA: Diagnosis not present

## 2012-04-19 DIAGNOSIS — M25569 Pain in unspecified knee: Secondary | ICD-10-CM | POA: Diagnosis not present

## 2012-04-19 DIAGNOSIS — M24569 Contracture, unspecified knee: Secondary | ICD-10-CM | POA: Diagnosis not present

## 2012-04-19 DIAGNOSIS — M1710 Unilateral primary osteoarthritis, unspecified knee: Secondary | ICD-10-CM | POA: Diagnosis not present

## 2012-04-20 DIAGNOSIS — M1710 Unilateral primary osteoarthritis, unspecified knee: Secondary | ICD-10-CM | POA: Diagnosis not present

## 2012-04-20 DIAGNOSIS — M24569 Contracture, unspecified knee: Secondary | ICD-10-CM | POA: Diagnosis not present

## 2012-04-20 DIAGNOSIS — M25569 Pain in unspecified knee: Secondary | ICD-10-CM | POA: Diagnosis not present

## 2012-04-20 DIAGNOSIS — Z96659 Presence of unspecified artificial knee joint: Secondary | ICD-10-CM | POA: Diagnosis not present

## 2012-04-26 DIAGNOSIS — M24569 Contracture, unspecified knee: Secondary | ICD-10-CM | POA: Diagnosis not present

## 2012-04-26 DIAGNOSIS — M1710 Unilateral primary osteoarthritis, unspecified knee: Secondary | ICD-10-CM | POA: Diagnosis not present

## 2012-04-26 DIAGNOSIS — Z96659 Presence of unspecified artificial knee joint: Secondary | ICD-10-CM | POA: Diagnosis not present

## 2012-04-26 DIAGNOSIS — M25569 Pain in unspecified knee: Secondary | ICD-10-CM | POA: Diagnosis not present

## 2012-04-28 DIAGNOSIS — M25569 Pain in unspecified knee: Secondary | ICD-10-CM | POA: Diagnosis not present

## 2012-04-28 DIAGNOSIS — M1710 Unilateral primary osteoarthritis, unspecified knee: Secondary | ICD-10-CM | POA: Diagnosis not present

## 2012-04-28 DIAGNOSIS — Z96659 Presence of unspecified artificial knee joint: Secondary | ICD-10-CM | POA: Diagnosis not present

## 2012-04-28 DIAGNOSIS — M24569 Contracture, unspecified knee: Secondary | ICD-10-CM | POA: Diagnosis not present

## 2012-04-30 DIAGNOSIS — E119 Type 2 diabetes mellitus without complications: Secondary | ICD-10-CM | POA: Diagnosis not present

## 2012-04-30 DIAGNOSIS — I10 Essential (primary) hypertension: Secondary | ICD-10-CM | POA: Diagnosis not present

## 2012-04-30 DIAGNOSIS — E7800 Pure hypercholesterolemia, unspecified: Secondary | ICD-10-CM | POA: Diagnosis not present

## 2012-04-30 DIAGNOSIS — D649 Anemia, unspecified: Secondary | ICD-10-CM | POA: Diagnosis not present

## 2012-04-30 DIAGNOSIS — E669 Obesity, unspecified: Secondary | ICD-10-CM | POA: Diagnosis not present

## 2012-05-05 DIAGNOSIS — M25569 Pain in unspecified knee: Secondary | ICD-10-CM | POA: Diagnosis not present

## 2012-05-05 DIAGNOSIS — M1710 Unilateral primary osteoarthritis, unspecified knee: Secondary | ICD-10-CM | POA: Diagnosis not present

## 2012-05-05 DIAGNOSIS — M24569 Contracture, unspecified knee: Secondary | ICD-10-CM | POA: Diagnosis not present

## 2012-05-05 DIAGNOSIS — Z96659 Presence of unspecified artificial knee joint: Secondary | ICD-10-CM | POA: Diagnosis not present

## 2012-05-24 DIAGNOSIS — M1710 Unilateral primary osteoarthritis, unspecified knee: Secondary | ICD-10-CM | POA: Diagnosis not present

## 2012-05-24 DIAGNOSIS — M24569 Contracture, unspecified knee: Secondary | ICD-10-CM | POA: Diagnosis not present

## 2012-06-21 DIAGNOSIS — M1710 Unilateral primary osteoarthritis, unspecified knee: Secondary | ICD-10-CM | POA: Diagnosis not present

## 2012-08-02 DIAGNOSIS — M1710 Unilateral primary osteoarthritis, unspecified knee: Secondary | ICD-10-CM | POA: Diagnosis not present

## 2012-09-23 DIAGNOSIS — J309 Allergic rhinitis, unspecified: Secondary | ICD-10-CM | POA: Diagnosis not present

## 2012-09-23 DIAGNOSIS — J45909 Unspecified asthma, uncomplicated: Secondary | ICD-10-CM | POA: Diagnosis not present

## 2012-09-29 ENCOUNTER — Other Ambulatory Visit: Payer: Self-pay

## 2012-09-29 DIAGNOSIS — Z1231 Encounter for screening mammogram for malignant neoplasm of breast: Secondary | ICD-10-CM

## 2012-10-11 DIAGNOSIS — H8190 Unspecified disorder of vestibular function, unspecified ear: Secondary | ICD-10-CM | POA: Diagnosis not present

## 2012-10-11 DIAGNOSIS — G541 Lumbosacral plexus disorders: Secondary | ICD-10-CM | POA: Diagnosis not present

## 2012-10-11 DIAGNOSIS — Z79899 Other long term (current) drug therapy: Secondary | ICD-10-CM | POA: Diagnosis not present

## 2012-10-11 DIAGNOSIS — H81399 Other peripheral vertigo, unspecified ear: Secondary | ICD-10-CM | POA: Diagnosis not present

## 2012-10-11 DIAGNOSIS — R269 Unspecified abnormalities of gait and mobility: Secondary | ICD-10-CM | POA: Diagnosis not present

## 2012-10-11 DIAGNOSIS — G608 Other hereditary and idiopathic neuropathies: Secondary | ICD-10-CM | POA: Diagnosis not present

## 2012-10-11 DIAGNOSIS — R209 Unspecified disturbances of skin sensation: Secondary | ICD-10-CM | POA: Diagnosis not present

## 2012-10-11 DIAGNOSIS — M533 Sacrococcygeal disorders, not elsewhere classified: Secondary | ICD-10-CM | POA: Diagnosis not present

## 2012-10-11 DIAGNOSIS — M542 Cervicalgia: Secondary | ICD-10-CM | POA: Diagnosis not present

## 2012-10-11 DIAGNOSIS — M545 Low back pain: Secondary | ICD-10-CM | POA: Diagnosis not present

## 2012-10-19 DIAGNOSIS — G541 Lumbosacral plexus disorders: Secondary | ICD-10-CM | POA: Diagnosis not present

## 2012-10-19 DIAGNOSIS — M545 Low back pain: Secondary | ICD-10-CM | POA: Diagnosis not present

## 2012-11-05 ENCOUNTER — Ambulatory Visit
Admission: RE | Admit: 2012-11-05 | Discharge: 2012-11-05 | Disposition: A | Discharge status: Home / Self Care | Payer: Medicare Other | Source: Ambulatory Visit

## 2012-11-05 DIAGNOSIS — Z1231 Encounter for screening mammogram for malignant neoplasm of breast: Secondary | ICD-10-CM | POA: Diagnosis not present

## 2012-11-17 DIAGNOSIS — M25569 Pain in unspecified knee: Secondary | ICD-10-CM | POA: Diagnosis not present

## 2012-11-17 DIAGNOSIS — M24569 Contracture, unspecified knee: Secondary | ICD-10-CM | POA: Diagnosis not present

## 2012-11-18 DIAGNOSIS — G541 Lumbosacral plexus disorders: Secondary | ICD-10-CM | POA: Diagnosis not present

## 2012-11-18 DIAGNOSIS — H8190 Unspecified disorder of vestibular function, unspecified ear: Secondary | ICD-10-CM | POA: Diagnosis not present

## 2012-11-18 DIAGNOSIS — G608 Other hereditary and idiopathic neuropathies: Secondary | ICD-10-CM | POA: Diagnosis not present

## 2012-11-18 DIAGNOSIS — H81399 Other peripheral vertigo, unspecified ear: Secondary | ICD-10-CM | POA: Diagnosis not present

## 2012-11-18 DIAGNOSIS — M542 Cervicalgia: Secondary | ICD-10-CM | POA: Diagnosis not present

## 2012-11-19 DIAGNOSIS — G608 Other hereditary and idiopathic neuropathies: Secondary | ICD-10-CM | POA: Diagnosis not present

## 2012-11-19 DIAGNOSIS — H81399 Other peripheral vertigo, unspecified ear: Secondary | ICD-10-CM | POA: Diagnosis not present

## 2012-11-19 DIAGNOSIS — G541 Lumbosacral plexus disorders: Secondary | ICD-10-CM | POA: Diagnosis not present

## 2012-11-19 DIAGNOSIS — M4310 Spondylolisthesis, site unspecified: Secondary | ICD-10-CM | POA: Diagnosis not present

## 2012-11-19 DIAGNOSIS — H8190 Unspecified disorder of vestibular function, unspecified ear: Secondary | ICD-10-CM | POA: Diagnosis not present

## 2012-11-19 DIAGNOSIS — M545 Low back pain: Secondary | ICD-10-CM | POA: Diagnosis not present

## 2012-11-22 DIAGNOSIS — E119 Type 2 diabetes mellitus without complications: Secondary | ICD-10-CM | POA: Diagnosis not present

## 2012-11-22 DIAGNOSIS — I10 Essential (primary) hypertension: Secondary | ICD-10-CM | POA: Diagnosis not present

## 2012-11-22 DIAGNOSIS — E7800 Pure hypercholesterolemia, unspecified: Secondary | ICD-10-CM | POA: Diagnosis not present

## 2012-11-22 DIAGNOSIS — J45909 Unspecified asthma, uncomplicated: Secondary | ICD-10-CM | POA: Diagnosis not present

## 2012-11-22 DIAGNOSIS — J309 Allergic rhinitis, unspecified: Secondary | ICD-10-CM | POA: Diagnosis not present

## 2012-11-29 DIAGNOSIS — M999 Biomechanical lesion, unspecified: Secondary | ICD-10-CM | POA: Diagnosis not present

## 2012-11-29 DIAGNOSIS — M545 Low back pain: Secondary | ICD-10-CM | POA: Diagnosis not present

## 2012-12-16 DIAGNOSIS — G541 Lumbosacral plexus disorders: Secondary | ICD-10-CM | POA: Diagnosis not present

## 2012-12-16 DIAGNOSIS — G608 Other hereditary and idiopathic neuropathies: Secondary | ICD-10-CM | POA: Diagnosis not present

## 2012-12-16 DIAGNOSIS — M542 Cervicalgia: Secondary | ICD-10-CM | POA: Diagnosis not present

## 2012-12-16 DIAGNOSIS — Z79899 Other long term (current) drug therapy: Secondary | ICD-10-CM | POA: Diagnosis not present

## 2012-12-16 DIAGNOSIS — H8190 Unspecified disorder of vestibular function, unspecified ear: Secondary | ICD-10-CM | POA: Diagnosis not present

## 2012-12-16 DIAGNOSIS — H81399 Other peripheral vertigo, unspecified ear: Secondary | ICD-10-CM | POA: Diagnosis not present

## 2012-12-24 DIAGNOSIS — M5137 Other intervertebral disc degeneration, lumbosacral region: Secondary | ICD-10-CM | POA: Diagnosis not present

## 2013-01-13 DIAGNOSIS — H8190 Unspecified disorder of vestibular function, unspecified ear: Secondary | ICD-10-CM | POA: Diagnosis not present

## 2013-01-13 DIAGNOSIS — G541 Lumbosacral plexus disorders: Secondary | ICD-10-CM | POA: Diagnosis not present

## 2013-01-13 DIAGNOSIS — G608 Other hereditary and idiopathic neuropathies: Secondary | ICD-10-CM | POA: Diagnosis not present

## 2013-01-13 DIAGNOSIS — M545 Low back pain: Secondary | ICD-10-CM | POA: Diagnosis not present

## 2013-01-13 DIAGNOSIS — H81399 Other peripheral vertigo, unspecified ear: Secondary | ICD-10-CM | POA: Diagnosis not present

## 2013-02-21 DIAGNOSIS — M24569 Contracture, unspecified knee: Secondary | ICD-10-CM | POA: Diagnosis not present

## 2013-02-21 DIAGNOSIS — M1710 Unilateral primary osteoarthritis, unspecified knee: Secondary | ICD-10-CM | POA: Diagnosis not present

## 2013-02-22 DIAGNOSIS — G608 Other hereditary and idiopathic neuropathies: Secondary | ICD-10-CM | POA: Diagnosis not present

## 2013-02-22 DIAGNOSIS — H8190 Unspecified disorder of vestibular function, unspecified ear: Secondary | ICD-10-CM | POA: Diagnosis not present

## 2013-02-22 DIAGNOSIS — H81399 Other peripheral vertigo, unspecified ear: Secondary | ICD-10-CM | POA: Diagnosis not present

## 2013-02-22 DIAGNOSIS — G541 Lumbosacral plexus disorders: Secondary | ICD-10-CM | POA: Diagnosis not present

## 2013-03-22 DIAGNOSIS — G608 Other hereditary and idiopathic neuropathies: Secondary | ICD-10-CM | POA: Diagnosis not present

## 2013-03-22 DIAGNOSIS — H8190 Unspecified disorder of vestibular function, unspecified ear: Secondary | ICD-10-CM | POA: Diagnosis not present

## 2013-03-22 DIAGNOSIS — M545 Low back pain: Secondary | ICD-10-CM | POA: Diagnosis not present

## 2013-03-22 DIAGNOSIS — G541 Lumbosacral plexus disorders: Secondary | ICD-10-CM | POA: Diagnosis not present

## 2013-03-22 DIAGNOSIS — H81399 Other peripheral vertigo, unspecified ear: Secondary | ICD-10-CM | POA: Diagnosis not present

## 2013-03-22 DIAGNOSIS — Z79899 Other long term (current) drug therapy: Secondary | ICD-10-CM | POA: Diagnosis not present

## 2013-03-31 ENCOUNTER — Other Ambulatory Visit: Payer: Self-pay

## 2013-04-01 DIAGNOSIS — J45909 Unspecified asthma, uncomplicated: Secondary | ICD-10-CM | POA: Diagnosis not present

## 2013-04-01 DIAGNOSIS — J309 Allergic rhinitis, unspecified: Secondary | ICD-10-CM | POA: Diagnosis not present

## 2013-04-18 DIAGNOSIS — R197 Diarrhea, unspecified: Secondary | ICD-10-CM | POA: Diagnosis not present

## 2013-04-27 DIAGNOSIS — M1710 Unilateral primary osteoarthritis, unspecified knee: Secondary | ICD-10-CM | POA: Diagnosis not present

## 2013-04-27 DIAGNOSIS — M24569 Contracture, unspecified knee: Secondary | ICD-10-CM | POA: Diagnosis not present

## 2013-04-29 DIAGNOSIS — M545 Low back pain: Secondary | ICD-10-CM | POA: Diagnosis not present

## 2013-04-29 DIAGNOSIS — S335XXA Sprain of ligaments of lumbar spine, initial encounter: Secondary | ICD-10-CM | POA: Diagnosis not present

## 2013-04-29 DIAGNOSIS — M5137 Other intervertebral disc degeneration, lumbosacral region: Secondary | ICD-10-CM | POA: Diagnosis not present

## 2013-04-29 DIAGNOSIS — M25569 Pain in unspecified knee: Secondary | ICD-10-CM | POA: Diagnosis not present

## 2013-05-30 DIAGNOSIS — H81399 Other peripheral vertigo, unspecified ear: Secondary | ICD-10-CM | POA: Diagnosis not present

## 2013-05-30 DIAGNOSIS — M5137 Other intervertebral disc degeneration, lumbosacral region: Secondary | ICD-10-CM | POA: Diagnosis not present

## 2013-05-30 DIAGNOSIS — G541 Lumbosacral plexus disorders: Secondary | ICD-10-CM | POA: Diagnosis not present

## 2013-05-30 DIAGNOSIS — M545 Low back pain, unspecified: Secondary | ICD-10-CM | POA: Diagnosis not present

## 2013-05-30 DIAGNOSIS — Z79899 Other long term (current) drug therapy: Secondary | ICD-10-CM | POA: Diagnosis not present

## 2013-05-30 DIAGNOSIS — G608 Other hereditary and idiopathic neuropathies: Secondary | ICD-10-CM | POA: Diagnosis not present

## 2013-05-30 DIAGNOSIS — H8190 Unspecified disorder of vestibular function, unspecified ear: Secondary | ICD-10-CM | POA: Diagnosis not present

## 2013-05-30 DIAGNOSIS — M542 Cervicalgia: Secondary | ICD-10-CM | POA: Diagnosis not present

## 2013-05-30 DIAGNOSIS — M533 Sacrococcygeal disorders, not elsewhere classified: Secondary | ICD-10-CM | POA: Diagnosis not present

## 2013-06-05 IMAGING — CR DG CHEST 2V
2 series · 2 of 2 positions shown · non-contrast
Comparison: 08/22/2003

CLINICAL DATA: Total knee arthroplasty

CHEST - 2 VIEW

[view not recorded (1 of 2)]
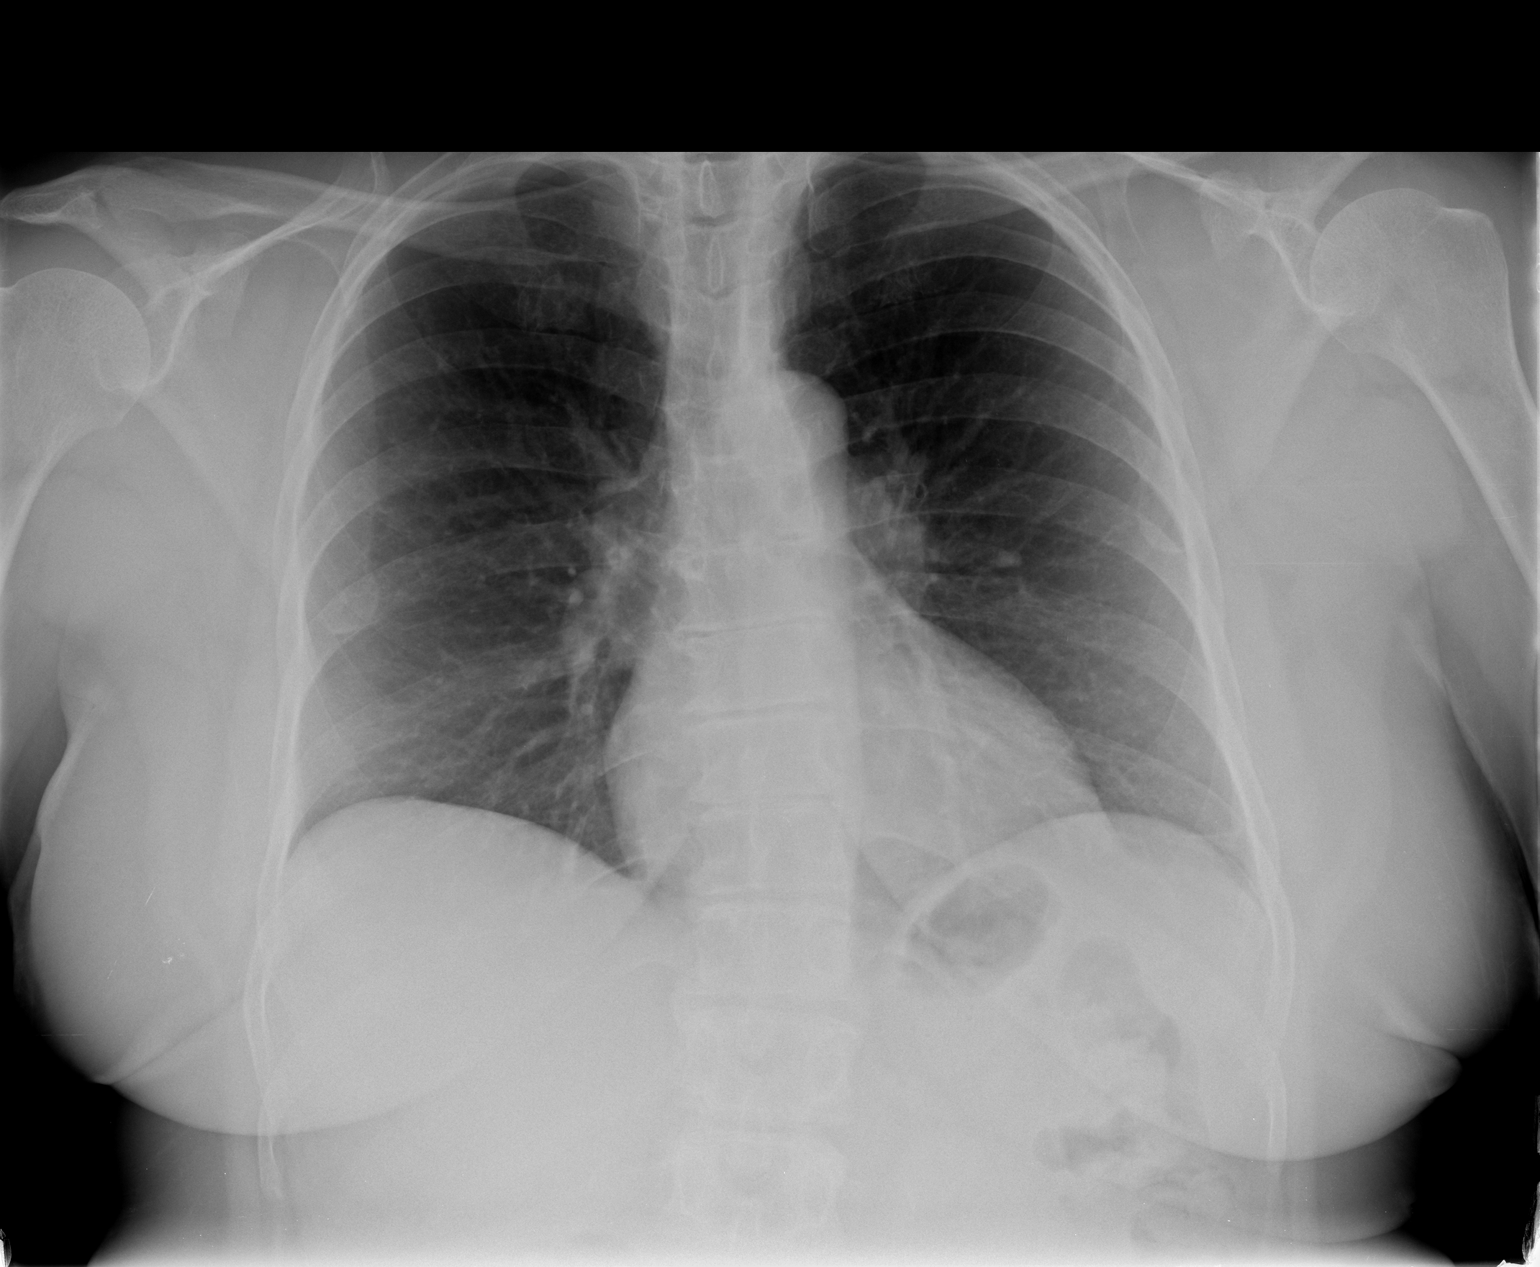

[view not recorded (2 of 2)]
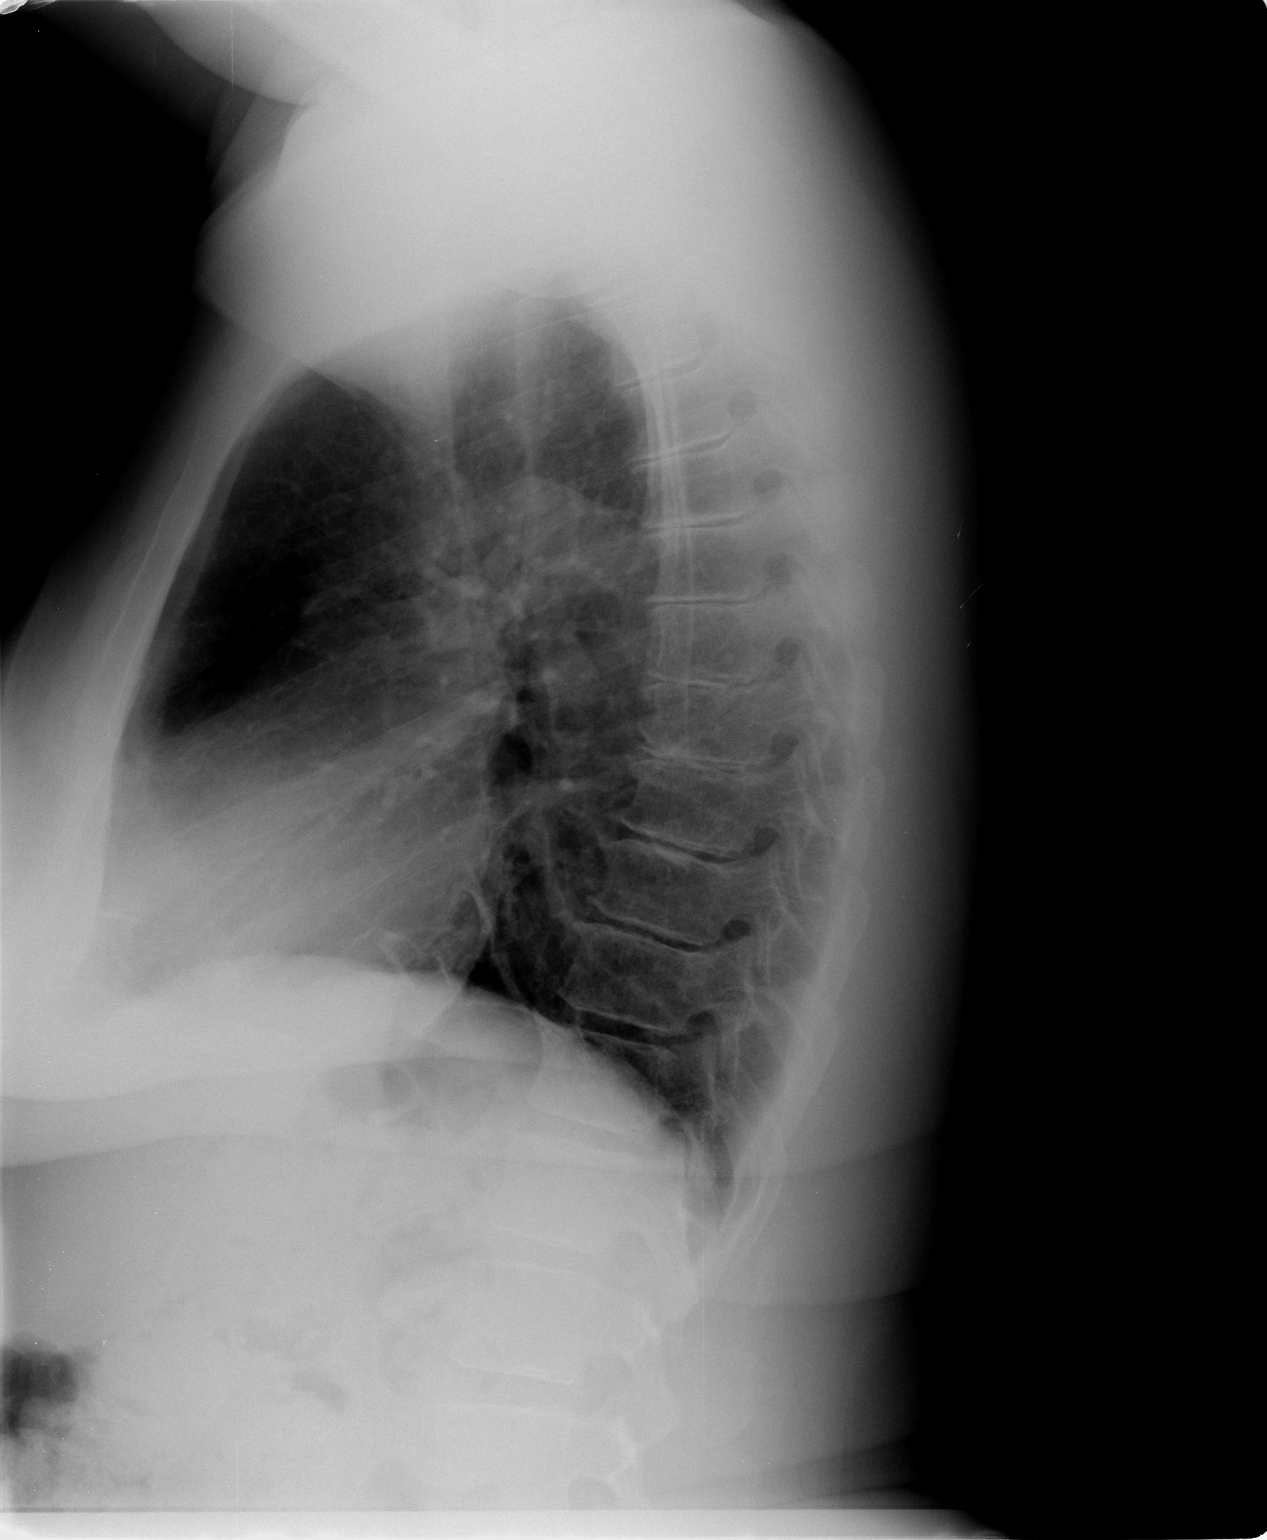

[2 of 2 positions shown; findings below may reference images not displayed]

FINDINGS: Hypoaeration results in mild vascular crowding.  Allowing
for this, unchanged cardiomediastinal contours.  No pleural
effusion or pneumothorax.  No confluent airspace opacity. Mild
multilevel degenerative changes of the visualized spine.  No acute
osseous change.
IMPRESSION: No radiographic evidence of acute cardiopulmonary process.

## 2013-07-01 DIAGNOSIS — M5137 Other intervertebral disc degeneration, lumbosacral region: Secondary | ICD-10-CM | POA: Diagnosis not present

## 2013-07-01 DIAGNOSIS — M545 Low back pain, unspecified: Secondary | ICD-10-CM | POA: Diagnosis not present

## 2013-07-01 DIAGNOSIS — M25569 Pain in unspecified knee: Secondary | ICD-10-CM | POA: Diagnosis not present

## 2013-07-26 DIAGNOSIS — M5137 Other intervertebral disc degeneration, lumbosacral region: Secondary | ICD-10-CM | POA: Diagnosis not present

## 2013-07-26 DIAGNOSIS — M25569 Pain in unspecified knee: Secondary | ICD-10-CM | POA: Diagnosis not present

## 2013-07-26 DIAGNOSIS — M545 Low back pain, unspecified: Secondary | ICD-10-CM | POA: Diagnosis not present

## 2013-07-26 DIAGNOSIS — M533 Sacrococcygeal disorders, not elsewhere classified: Secondary | ICD-10-CM | POA: Diagnosis not present

## 2013-07-26 DIAGNOSIS — Z79899 Other long term (current) drug therapy: Secondary | ICD-10-CM | POA: Diagnosis not present

## 2013-08-08 DIAGNOSIS — I10 Essential (primary) hypertension: Secondary | ICD-10-CM | POA: Diagnosis not present

## 2013-08-08 DIAGNOSIS — E119 Type 2 diabetes mellitus without complications: Secondary | ICD-10-CM | POA: Diagnosis not present

## 2013-08-08 DIAGNOSIS — E7800 Pure hypercholesterolemia, unspecified: Secondary | ICD-10-CM | POA: Diagnosis not present

## 2013-08-23 ENCOUNTER — Emergency Department (HOSPITAL_COMMUNITY)
Admission: EM | Admit: 2013-08-23 | Discharge: 2013-08-23 | Discharge status: Against Medical Advice | Payer: Medicare Other | Source: Home / Self Care

## 2013-08-23 ENCOUNTER — Emergency Department (HOSPITAL_COMMUNITY)
Admission: EM | Admit: 2013-08-23 | Discharge: 2013-08-24 | Disposition: A | Discharge status: Home / Self Care | Payer: Medicare Other | Attending: Emergency Medicine | Admitting: Emergency Medicine

## 2013-08-23 ENCOUNTER — Encounter (HOSPITAL_COMMUNITY): Payer: Self-pay | Admitting: Emergency Medicine

## 2013-08-23 DIAGNOSIS — E119 Type 2 diabetes mellitus without complications: Secondary | ICD-10-CM | POA: Diagnosis not present

## 2013-08-23 DIAGNOSIS — R1110 Vomiting, unspecified: Secondary | ICD-10-CM

## 2013-08-23 DIAGNOSIS — M1710 Unilateral primary osteoarthritis, unspecified knee: Secondary | ICD-10-CM | POA: Insufficient documentation

## 2013-08-23 DIAGNOSIS — M545 Low back pain, unspecified: Secondary | ICD-10-CM | POA: Diagnosis not present

## 2013-08-23 DIAGNOSIS — Z791 Long term (current) use of non-steroidal anti-inflammatories (NSAID): Secondary | ICD-10-CM | POA: Diagnosis not present

## 2013-08-23 DIAGNOSIS — I10 Essential (primary) hypertension: Secondary | ICD-10-CM | POA: Insufficient documentation

## 2013-08-23 DIAGNOSIS — R109 Unspecified abdominal pain: Secondary | ICD-10-CM

## 2013-08-23 DIAGNOSIS — Z79899 Other long term (current) drug therapy: Secondary | ICD-10-CM | POA: Insufficient documentation

## 2013-08-23 DIAGNOSIS — IMO0002 Reserved for concepts with insufficient information to code with codable children: Secondary | ICD-10-CM | POA: Insufficient documentation

## 2013-08-23 DIAGNOSIS — R197 Diarrhea, unspecified: Secondary | ICD-10-CM | POA: Diagnosis not present

## 2013-08-23 DIAGNOSIS — R51 Headache: Secondary | ICD-10-CM | POA: Diagnosis not present

## 2013-08-23 DIAGNOSIS — R112 Nausea with vomiting, unspecified: Secondary | ICD-10-CM

## 2013-08-23 DIAGNOSIS — N390 Urinary tract infection, site not specified: Secondary | ICD-10-CM | POA: Diagnosis not present

## 2013-08-23 DIAGNOSIS — J45909 Unspecified asthma, uncomplicated: Secondary | ICD-10-CM | POA: Insufficient documentation

## 2013-08-23 DIAGNOSIS — M533 Sacrococcygeal disorders, not elsewhere classified: Secondary | ICD-10-CM | POA: Diagnosis not present

## 2013-08-23 DIAGNOSIS — M5137 Other intervertebral disc degeneration, lumbosacral region: Secondary | ICD-10-CM | POA: Diagnosis not present

## 2013-08-23 DIAGNOSIS — M5030 Other cervical disc degeneration, unspecified cervical region: Secondary | ICD-10-CM | POA: Diagnosis not present

## 2013-08-23 LAB — CBC WITH DIFFERENTIAL/PLATELET
Basophils Absolute: 0 10*3/uL (ref 0.0–0.1)
Basophils Absolute: 0 10*3/uL (ref 0.0–0.1)
Basophils Relative: 0 % (ref 0–1)
Basophils Relative: 0 % (ref 0–1)
EOS PCT: 0 % (ref 0–5)
Eosinophils Absolute: 0 10*3/uL (ref 0.0–0.7)
Eosinophils Absolute: 0 10*3/uL (ref 0.0–0.7)
Eosinophils Relative: 0 % (ref 0–5)
HEMATOCRIT: 34.1 % — AB (ref 36.0–46.0)
HEMATOCRIT: 34.3 % — AB (ref 36.0–46.0)
HEMOGLOBIN: 11 g/dL — AB (ref 12.0–15.0)
HEMOGLOBIN: 11.3 g/dL — AB (ref 12.0–15.0)
LYMPHS ABS: 0.4 10*3/uL — AB (ref 0.7–4.0)
LYMPHS ABS: 0.5 10*3/uL — AB (ref 0.7–4.0)
LYMPHS PCT: 4 % — AB (ref 12–46)
LYMPHS PCT: 6 % — AB (ref 12–46)
MCH: 25.7 pg — ABNORMAL LOW (ref 26.0–34.0)
MCH: 26 pg (ref 26.0–34.0)
MCHC: 32.3 g/dL (ref 30.0–36.0)
MCHC: 32.9 g/dL (ref 30.0–36.0)
MCV: 79.7 fL (ref 78.0–100.0)
MCV: 79 fL (ref 78.0–100.0)
MONO ABS: 0.3 10*3/uL (ref 0.1–1.0)
MONO ABS: 0.4 10*3/uL (ref 0.1–1.0)
MONOS PCT: 5 % (ref 3–12)
Monocytes Relative: 3 % (ref 3–12)
NEUTROS ABS: 8.4 10*3/uL — AB (ref 1.7–7.7)
NEUTROS ABS: 7.1 10*3/uL (ref 1.7–7.7)
NEUTROS PCT: 89 % — AB (ref 43–77)
Neutrophils Relative %: 93 % — ABNORMAL HIGH (ref 43–77)
Platelets: 259 10*3/uL (ref 150–400)
Platelets: 258 10*3/uL (ref 150–400)
RBC: 4.28 MIL/uL (ref 3.87–5.11)
RBC: 4.34 MIL/uL (ref 3.87–5.11)
RDW: 14.3 % (ref 11.5–15.5)
RDW: 14.4 % (ref 11.5–15.5)
WBC: 9 10*3/uL (ref 4.0–10.5)
WBC: 8 10*3/uL (ref 4.0–10.5)

## 2013-08-23 LAB — I-STAT TROPONIN, ED: TROPONIN I, POC: 0.01 ng/mL (ref 0.00–0.08)

## 2013-08-23 LAB — COMPREHENSIVE METABOLIC PANEL
ALBUMIN: 4.4 g/dL (ref 3.5–5.2)
ALK PHOS: 134 U/L — AB (ref 39–117)
ALT: 17 U/L (ref 0–35)
ALT: 17 U/L (ref 0–35)
AST: 18 U/L (ref 0–37)
AST: 19 U/L (ref 0–37)
Albumin: 4.1 g/dL (ref 3.5–5.2)
Alkaline Phosphatase: 126 U/L — ABNORMAL HIGH (ref 39–117)
BILIRUBIN TOTAL: 0.5 mg/dL (ref 0.3–1.2)
BILIRUBIN TOTAL: 0.6 mg/dL (ref 0.3–1.2)
BUN: 8 mg/dL (ref 6–23)
BUN: 8 mg/dL (ref 6–23)
CALCIUM: 9.6 mg/dL (ref 8.4–10.5)
CHLORIDE: 101 meq/L (ref 96–112)
CHLORIDE: 102 meq/L (ref 96–112)
CO2: 27 meq/L (ref 19–32)
CO2: 26 meq/L (ref 19–32)
CREATININE: 0.74 mg/dL (ref 0.50–1.10)
CREATININE: 0.74 mg/dL (ref 0.50–1.10)
Calcium: 10.2 mg/dL (ref 8.4–10.5)
GLUCOSE: 111 mg/dL — AB (ref 70–99)
GLUCOSE: 105 mg/dL — AB (ref 70–99)
POTASSIUM: 3.9 meq/L (ref 3.7–5.3)
Potassium: 4.2 mEq/L (ref 3.7–5.3)
Sodium: 143 mEq/L (ref 137–147)
Sodium: 143 mEq/L (ref 137–147)
Total Protein: 7.9 g/dL (ref 6.0–8.3)
Total Protein: 8.4 g/dL — ABNORMAL HIGH (ref 6.0–8.3)

## 2013-08-23 LAB — URINALYSIS, ROUTINE W REFLEX MICROSCOPIC
BILIRUBIN URINE: NEGATIVE
BILIRUBIN URINE: NEGATIVE
Glucose, UA: NEGATIVE mg/dL
Glucose, UA: NEGATIVE mg/dL
HGB URINE DIPSTICK: NEGATIVE
Hgb urine dipstick: NEGATIVE
KETONES UR: NEGATIVE mg/dL
KETONES UR: NEGATIVE mg/dL
NITRITE: NEGATIVE
NITRITE: NEGATIVE
PH: 7 (ref 5.0–8.0)
PH: 6 (ref 5.0–8.0)
PROTEIN: NEGATIVE mg/dL
Protein, ur: NEGATIVE mg/dL
SPECIFIC GRAVITY, URINE: 1.018 (ref 1.005–1.030)
Specific Gravity, Urine: 1.024 (ref 1.005–1.030)
Urobilinogen, UA: 0.2 mg/dL (ref 0.0–1.0)
Urobilinogen, UA: 1 mg/dL (ref 0.0–1.0)

## 2013-08-23 LAB — URINE MICROSCOPIC-ADD ON

## 2013-08-23 LAB — LIPASE, BLOOD
LIPASE: 16 U/L (ref 11–59)
Lipase: 33 U/L (ref 11–59)

## 2013-08-23 MED ORDER — SODIUM CHLORIDE 0.9 % IV BOLUS (SEPSIS)
1000.0000 mL | Freq: Once | INTRAVENOUS | Status: AC
  Administered 2013-08-23: 1000 mL via INTRAVENOUS

## 2013-08-23 MED ORDER — DEXTROSE 5 % IV SOLN
1.0000 g | Freq: Once | INTRAVENOUS | Status: AC
  Administered 2013-08-23: 1 g via INTRAVENOUS
  Filled 2013-08-23: qty 10

## 2013-08-23 MED ORDER — SODIUM CHLORIDE 0.9 % IV SOLN
INTRAVENOUS | Status: DC
Start: 2013-08-23 — End: 2013-08-24

## 2013-08-23 MED ORDER — ONDANSETRON HCL 8 MG PO TABS: 8.0000 mg | ORAL_TABLET | Freq: Three times a day (TID) | ORAL | Status: DC | PRN

## 2013-08-23 MED ORDER — ONDANSETRON HCL 4 MG/2ML IJ SOLN
4.0000 mg | Freq: Once | INTRAMUSCULAR | Status: AC
  Administered 2013-08-23: 4 mg via INTRAVENOUS
  Filled 2013-08-23: qty 2

## 2013-08-23 MED ORDER — CEPHALEXIN 250 MG PO CAPS: 250.0000 mg | ORAL_CAPSULE | Freq: Four times a day (QID) | ORAL | Status: DC

## 2013-08-23 MED ORDER — ONDANSETRON 8 MG PO TBDP
8.0000 mg | ORAL_TABLET | Freq: Once | ORAL | Status: AC
  Administered 2013-08-23: 8 mg via ORAL
  Filled 2013-08-23: qty 1

## 2013-08-23 MED ORDER — IBUPROFEN 200 MG PO TABS
400.0000 mg | ORAL_TABLET | Freq: Once | ORAL | Status: AC
  Administered 2013-08-23: 400 mg via ORAL
  Filled 2013-08-23: qty 2

## 2013-08-23 MED ORDER — HYDROCODONE-ACETAMINOPHEN 5-325 MG PO TABS: 1.0000 | ORAL_TABLET | ORAL | Status: DC | PRN

## 2013-08-23 MED ORDER — MORPHINE SULFATE 4 MG/ML IJ SOLN
4.0000 mg | Freq: Once | INTRAMUSCULAR | Status: AC
  Administered 2013-08-23: 4 mg via INTRAVENOUS
  Filled 2013-08-23: qty 1

## 2013-08-23 NOTE — ED Notes (Signed)
Pt family sts that pt is leaving. sts her mom is in pain. Offered pt some pain medication and she refused. Family sts her dad died here and she is not happy with [place] about it. Told pt that I was sorry for her loss and the wait. Pt left

## 2013-08-23 NOTE — ED Notes (Addendum)
Pt reports waking up with a throbbing headache. Pt states that she has vomited x6 and headache pain is worse when moving head and that she has some generalized ab pain. Pt reports diarrhea x3. Pt alert and ambulatory to triage. Pt also reports back pain and feeling nauseated.

## 2013-08-23 NOTE — ED Provider Notes (Signed)
CSN: [id number]     Arrival date & time 08/23/13  1833 History   First MD Initiated Contact with Patient 08/23/13 2155     Chief Complaint  Patient presents with  . Abdominal Pain  . Headache     (Consider location/radiation/quality/duration/timing/severity/associated sxs/prior Treatment) Patient is a 58 y.o. female presenting with abdominal pain and headaches. The history is provided by the patient.  Abdominal Pain Headache Associated symptoms: abdominal pain    She has been ill since this morning with vomiting, diarrhea, and headache. The emesis has been for color, the diarrhea has been brown. She denies fever, neck pain, back, pain, weakness, or dizziness. She's taking her usual medications, without relief. She was around 2 family members that had a similar illness over the weekend.  Past Medical History  Diagnosis Date  . Diabetes mellitus   . Hypertension   . Asthma   . Osteoarthritis of left knee 02/23/2012  . 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 Joint replacement      acl   bil knees  . Abdominal hysterectomy    . Total knee arthroplasty  02/23/2012    Procedure: TOTAL KNEE ARTHROPLASTY;  Surgeon: Johnny Bridge, MD;  Location: [facility];  Service: Orthopedics;  Laterality: Left;  . Knee closed reduction  04/09/2012    Procedure: CLOSED MANIPULATION KNEE;  Surgeon: Johnny Bridge, MD;  Location: [facility];  Service: Orthopedics;  Laterality: Left;  Manipulation Knee with Anesthesia includes Application of Traction   . Injection knee  04/09/2012    Procedure: KNEE INJECTION;  Surgeon: Johnny Bridge, MD;  Location: [facility];  Service: Orthopedics;  Laterality: Left;   History reviewed. No pertinent family history. History  Substance Use Topics  . Smoking status: Never Smoker   . Smokeless tobacco: Not on file  . Alcohol Use: No   OB History   Grav Para  Term Preterm Abortions TAB SAB Ect Mult Living                 Review of Systems  Gastrointestinal: Positive for abdominal pain.  Neurological: Positive for headaches.      Allergies  Review of patient's allergies indicates no known allergies.  Home Medications   Current Outpatient Rx  Name  Route  Sig  Dispense  Refill  . albuterol (PROVENTIL HFA;VENTOLIN HFA) 108 (90 BASE) MCG/ACT inhaler   Inhalation   Inhale 2 puffs into the lungs every 4 (four) hours as needed. For wheezing         . aspirin-acetaminophen-caffeine (EXCEDRIN MIGRAINE) 250-250-65 MG per tablet   Oral   Take 2 tablets by mouth daily.         Marland Kitchen atorvastatin (LIPITOR) 80 MG tablet   Oral   Take 80 mg by mouth daily.         Marland Kitchen azelastine (ASTELIN) 137 MCG/SPRAY nasal spray   Nasal   Place 1 spray into the nose 2 (two) times daily. Use in each nostril as directed         . baclofen (LIORESAL) 10 MG tablet   Oral   Take 10 mg by mouth every 12 (twelve) hours.         . beclomethasone (QVAR) 80 MCG/ACT inhaler   Inhalation   Inhale 2 puffs into the lungs 2 (two) times daily.         . diclofenac sodium (  VOLTAREN) 1 % GEL   Topical   Apply 8 g topically 2 (two) times daily. On knees         . DiphenhydrAMINE HCl, Sleep, (ZZZQUIL) 25 MG CAPS   Oral   Take 1 capsule by mouth at bedtime.         Marland Kitchen linagliptin (TRADJENTA) 5 MG TABS tablet   Oral   Take 5 mg by mouth daily.         . meloxicam (MOBIC) 15 MG tablet   Oral   Take 15 mg by mouth daily.         . metFORMIN (GLUCOPHAGE) 500 MG tablet   Oral   Take 500 mg by mouth 2 (two) times daily with a meal.          . montelukast (SINGULAIR) 10 MG tablet   Oral   Take 10 mg by mouth at bedtime.         . Multiple Vitamin (MULTIVITAMIN WITH MINERALS) TABS tablet   Oral   Take 1 tablet by mouth daily.         . Oxycodone HCl 10 MG TABS   Oral   Take 10 mg by mouth 3 (three) times daily.         . quinapril  (ACCUPRIL) 20 MG tablet   Oral   Take 20 mg by mouth at bedtime.          BP 170/74  Pulse 84  Temp(Src) 99.1 F (37.3 C) (Oral)  Resp 18  Wt 195 lb (88.451 kg)  SpO2 100% Physical Exam  Nursing note and vitals reviewed. Constitutional: She is oriented to person, place, and time. She appears well-developed and well-nourished.  HENT:  Head: Normocephalic and atraumatic.  Eyes: Conjunctivae and EOM are normal. Pupils are equal, round, and reactive to light.  Neck: Normal range of motion and phonation normal. Neck supple.  Cardiovascular: Normal rate, regular rhythm and intact distal pulses.   Pulmonary/Chest: Effort normal and breath sounds normal. She exhibits no tenderness.  Abdominal: Soft. She exhibits no distension. There is no tenderness. There is no guarding.  Musculoskeletal: Normal range of motion.  Lymphadenopathy:    She has no cervical adenopathy.  Neurological: She is alert and oriented to person, place, and time. She exhibits normal muscle tone.  Skin: Skin is warm and dry.  Psychiatric: She has a normal mood and affect. Her behavior is normal. Judgment and thought content normal.    ED Course  Procedures (including critical care time)  Medications  0.9 %  sodium chloride infusion (not administered)  ondansetron (ZOFRAN-ODT) disintegrating tablet 8 mg (8 mg Oral Given 08/23/13 1940)  ibuprofen (ADVIL,MOTRIN) tablet 400 mg (400 mg Oral Given 08/23/13 1939)  sodium chloride 0.9 % bolus 1,000 mL (1,000 mLs Intravenous New Bag/Given 08/23/13 2231)  morphine 4 MG/ML injection 4 mg (4 mg Intravenous Given 08/23/13 2230)  ondansetron (ZOFRAN) injection 4 mg (4 mg Intravenous Given 08/23/13 2230)  cefTRIAXone (ROCEPHIN) 1 g in dextrose 5 % 50 mL IVPB (1 g Intravenous New Bag/Given 08/23/13 2230)    Patient Vitals for the past 24 hrs:  BP Temp Temp src Pulse Resp SpO2 Weight  08/23/13 1920 170/74 mmHg 99.1 F (37.3 C) Oral 84 18 100 % 195 lb (88.451 kg)    11:52 PM  Reevaluation with update and discussion. After initial assessment and treatment, an updated evaluation reveals she is sitting up, comfortable and has not vomited. Findings discussed with patient and family  members, all questions answered.Daleen Bo L    Labs Review Labs Reviewed  CBC WITH DIFFERENTIAL - Abnormal; Notable for the following:    Hemoglobin 11.3 (*)    HCT 34.3 (*)    Neutrophils Relative % 89 (*)    Lymphocytes Relative 6 (*)    Lymphs Abs 0.5 (*)    All other components within normal limits  COMPREHENSIVE METABOLIC PANEL - Abnormal; Notable for the following:    Glucose, Bld 105 (*)    Total Protein 8.4 (*)    Alkaline Phosphatase 134 (*)    All other components within normal limits  URINALYSIS, ROUTINE W REFLEX MICROSCOPIC - Abnormal; Notable for the following:    APPearance CLOUDY (*)    Leukocytes, UA SMALL (*)    All other components within normal limits  URINE MICROSCOPIC-ADD ON - Abnormal; Notable for the following:    Bacteria, UA FEW (*)    All other components within normal limits  URINE CULTURE  LIPASE, BLOOD     Final diagnoses:  None   Nonspecific symptoms with possible urinary tract infection. She has both vomiting and diarrhea, which may indicate enteritis, as well. She is improved with symptomatic treatment. Doubt meningitis, serious bacterial infection, sepsis, impending vascular collapse.  Nursing Notes Reviewed/ Care Coordinated Applicable Imaging Reviewed Interpretation of Laboratory Data incorporated into ED treatment  The patient appears reasonably screened and/or stabilized for discharge and I doubt any other medical condition or other [facility] requiring further screening, evaluation, or treatment in the ED at this time prior to discharge.  Plan: Home Medications- Keflex, Zofran, Norco; Home Treatments- Gradually advance diet; return here if the recommended treatment, does not improve the symptoms; Recommended follow up- PCP for check up in  1 week    Richarda Blade, MD 08/24/13 0002

## 2013-08-23 NOTE — ED Notes (Signed)
Pt reports N/V/D with upper abdominal cramping since this AM. Pt in NAD. Denies urinary syx, recent F/C.

## 2013-08-24 NOTE — Discharge Instructions (Signed)
Nausea and Vomiting Nausea is a sick feeling that often comes before throwing up (vomiting). Vomiting is a reflex where stomach contents come out of your mouth. Vomiting can cause severe loss of body fluids (dehydration). Children and elderly adults can become dehydrated quickly, especially if they also have diarrhea. Nausea and vomiting are symptoms of a condition or disease. It is important to find the cause of your symptoms. CAUSES   Direct irritation of the stomach lining. This irritation can result from increased acid production (gastroesophageal reflux disease), infection, food poisoning, taking certain medicines (such as nonsteroidal anti-inflammatory drugs), alcohol use, or tobacco use.  Signals from the brain.These signals could be caused by a headache, heat exposure, an inner ear disturbance, increased pressure in the brain from injury, infection, a tumor, or a concussion, pain, emotional stimulus, or metabolic problems.  An obstruction in the gastrointestinal tract (bowel obstruction).  Illnesses such as diabetes, hepatitis, gallbladder problems, appendicitis, kidney problems, cancer, sepsis, atypical symptoms of a heart attack, or eating disorders.  Medical treatments such as chemotherapy and radiation.  Receiving medicine that makes you sleep (general anesthetic) during surgery. DIAGNOSIS Your caregiver may ask for tests to be done if the problems do not improve after a few days. Tests may also be done if symptoms are severe or if the reason for the nausea and vomiting is not clear. Tests may include:  Urine tests.  Blood tests.  Stool tests.  Cultures (to look for evidence of infection).  X-rays or other imaging studies. Test results can help your caregiver make decisions about treatment or the need for additional tests. TREATMENT You need to stay well hydrated. Drink frequently but in small amounts.You may wish to drink water, sports drinks, clear broth, or eat frozen  ice pops or gelatin dessert to help stay hydrated.When you eat, eating slowly may help prevent nausea.There are also some antinausea medicines that may help prevent nausea. HOME CARE INSTRUCTIONS   Take all medicine as directed by your caregiver.  If you do not have an appetite, do not force yourself to eat. However, you must continue to drink fluids.  If you have an appetite, eat a normal diet unless your caregiver tells you differently.  Eat a variety of complex carbohydrates (rice, wheat, potatoes, bread), lean meats, yogurt, fruits, and vegetables.  Avoid high-fat foods because they are more difficult to digest.  Drink enough water and fluids to keep your urine clear or pale yellow.  If you are dehydrated, ask your caregiver for specific rehydration instructions. Signs of dehydration may include:  Severe thirst.  Dry lips and mouth.  Dizziness.  Dark urine.  Decreasing urine frequency and amount.  Confusion.  Rapid breathing or pulse. SEEK IMMEDIATE MEDICAL CARE IF:   You have blood or brown flecks (like coffee grounds) in your vomit.  You have black or bloody stools.  You have a severe headache or stiff neck.  You are confused.  You have severe abdominal pain.  You have chest pain or trouble breathing.  You do not urinate at least once every 8 hours.  You develop cold or clammy skin.  You continue to vomit for longer than 24 to 48 hours.  You have a fever. MAKE SURE YOU:   Understand these instructions.  Will watch your condition.  Will get help right away if you are not doing well or get worse. Document Released: 05/12/2005 Document Revised: 08/04/2011 Document Reviewed: 10/09/2010 [facility] Patient Information 2014 [place], [place].  Urinary Tract Infection Urinary  tract infections (UTIs) can develop anywhere along your urinary tract. Your urinary tract is your body's drainage system for removing wastes and extra water. Your urinary tract includes  two kidneys, two ureters, a bladder, and a urethra. Your kidneys are a pair of bean-shaped organs. Each kidney is about the size of your fist. They are located below your ribs, one on each side of your spine. CAUSES Infections are caused by microbes, which are microscopic organisms, including fungi, viruses, and bacteria. These organisms are so small that they can only be seen through a microscope. Bacteria are the microbes that most commonly cause UTIs. SYMPTOMS  Symptoms of UTIs may vary by age and gender of the patient and by the location of the infection. Symptoms in young women typically include a frequent and intense urge to urinate and a painful, burning feeling in the bladder or urethra during urination. Older women and men are more likely to be tired, shaky, and weak and have muscle aches and abdominal pain. A fever may mean the infection is in your kidneys. Other symptoms of a kidney infection include pain in your back or sides below the ribs, nausea, and vomiting. DIAGNOSIS To diagnose a UTI, your caregiver will ask you about your symptoms. Your caregiver also will ask to provide a urine sample. The urine sample will be tested for bacteria and white blood cells. White blood cells are made by your body to help fight infection. TREATMENT  Typically, UTIs can be treated with medication. Because most UTIs are caused by a bacterial infection, they usually can be treated with the use of antibiotics. The choice of antibiotic and length of treatment depend on your symptoms and the type of bacteria causing your infection. HOME CARE INSTRUCTIONS  If you were prescribed antibiotics, take them exactly as your caregiver instructs you. Finish the medication even if you feel better after you have only taken some of the medication.  Drink enough water and fluids to keep your urine clear or pale yellow.  Avoid caffeine, tea, and carbonated beverages. They tend to irritate your bladder.  Empty your bladder  often. Avoid holding urine for long periods of time.  Empty your bladder before and after sexual intercourse.  After a bowel movement, women should cleanse from front to back. Use each tissue only once. SEEK MEDICAL CARE IF:   You have back pain.  You develop a fever.  Your symptoms do not begin to resolve within 3 days. SEEK IMMEDIATE MEDICAL CARE IF:   You have severe back pain or lower abdominal pain.  You develop chills.  You have nausea or vomiting.  You have continued burning or discomfort with urination. MAKE SURE YOU:   Understand these instructions.  Will watch your condition.  Will get help right away if you are not doing well or get worse. Document Released: 02/19/2005 Document Revised: 11/11/2011 Document Reviewed: 06/20/2011 [facility] Patient Information 2014 [place].

## 2013-08-24 NOTE — ED Notes (Signed)
Patient is alert and oriented x3.  She was given DC instructions and follow up visit instructions.  Patient gave verbal understanding. She was DC ambulatory under her own power to home.  V/S stable.  He was not showing any signs of distress on DC 

## 2013-08-26 LAB — URINE CULTURE: Colony Count: >=100000

## 2013-08-27 ENCOUNTER — Telehealth (HOSPITAL_BASED_OUTPATIENT_CLINIC_OR_DEPARTMENT_OTHER): Payer: Self-pay | Admitting: Emergency Medicine

## 2013-08-27 NOTE — Telephone Encounter (Signed)
Post ED Visit - Positive Culture Follow-up  Culture report reviewed by antimicrobial stewardship pharmacist: []  Wes Ladera, Pharm.D., BCPS [x]  Heide Guile, Pharm.D., BCPS []  Alycia Rossetti, Pharm.D., BCPS []  [place], Pharm.D., BCPS, AAHIVP []  Legrand Como, Pharm.D., BCPS, AAHIVP  Positive urine culture Treated with Keflex, organism sensitive to the same and no further patient follow-up is required at this time.  Myrna Blazer 08/27/2013, 10:43 AM

## 2013-09-29 DIAGNOSIS — Z79899 Other long term (current) drug therapy: Secondary | ICD-10-CM | POA: Diagnosis not present

## 2013-09-29 DIAGNOSIS — G894 Chronic pain syndrome: Secondary | ICD-10-CM | POA: Diagnosis not present

## 2013-09-29 DIAGNOSIS — M79609 Pain in unspecified limb: Secondary | ICD-10-CM | POA: Diagnosis not present

## 2013-09-29 DIAGNOSIS — M545 Low back pain, unspecified: Secondary | ICD-10-CM | POA: Diagnosis not present

## 2013-09-29 DIAGNOSIS — M5430 Sciatica, unspecified side: Secondary | ICD-10-CM | POA: Diagnosis not present

## 2013-09-29 DIAGNOSIS — M533 Sacrococcygeal disorders, not elsewhere classified: Secondary | ICD-10-CM | POA: Diagnosis not present

## 2013-10-14 ENCOUNTER — Other Ambulatory Visit: Payer: Self-pay

## 2013-10-14 DIAGNOSIS — Z1231 Encounter for screening mammogram for malignant neoplasm of breast: Secondary | ICD-10-CM

## 2013-10-19 DIAGNOSIS — J45909 Unspecified asthma, uncomplicated: Secondary | ICD-10-CM | POA: Diagnosis not present

## 2013-10-19 DIAGNOSIS — J309 Allergic rhinitis, unspecified: Secondary | ICD-10-CM | POA: Diagnosis not present

## 2013-10-27 DIAGNOSIS — M79609 Pain in unspecified limb: Secondary | ICD-10-CM | POA: Diagnosis not present

## 2013-10-27 DIAGNOSIS — Z79899 Other long term (current) drug therapy: Secondary | ICD-10-CM | POA: Diagnosis not present

## 2013-10-27 DIAGNOSIS — M533 Sacrococcygeal disorders, not elsewhere classified: Secondary | ICD-10-CM | POA: Diagnosis not present

## 2013-10-27 DIAGNOSIS — M545 Low back pain, unspecified: Secondary | ICD-10-CM | POA: Diagnosis not present

## 2013-10-27 DIAGNOSIS — G894 Chronic pain syndrome: Secondary | ICD-10-CM | POA: Diagnosis not present

## 2013-10-27 DIAGNOSIS — M5430 Sciatica, unspecified side: Secondary | ICD-10-CM | POA: Diagnosis not present

## 2013-11-07 ENCOUNTER — Ambulatory Visit
Admission: RE | Admit: 2013-11-07 | Discharge: 2013-11-07 | Disposition: A | Discharge status: Home / Self Care | Payer: Medicare Other | Source: Ambulatory Visit

## 2013-11-07 DIAGNOSIS — Z1231 Encounter for screening mammogram for malignant neoplasm of breast: Secondary | ICD-10-CM | POA: Diagnosis not present

## 2013-11-24 DIAGNOSIS — M5430 Sciatica, unspecified side: Secondary | ICD-10-CM | POA: Diagnosis not present

## 2013-11-24 DIAGNOSIS — M545 Low back pain, unspecified: Secondary | ICD-10-CM | POA: Diagnosis not present

## 2013-11-24 DIAGNOSIS — Z79899 Other long term (current) drug therapy: Secondary | ICD-10-CM | POA: Diagnosis not present

## 2013-11-24 DIAGNOSIS — M533 Sacrococcygeal disorders, not elsewhere classified: Secondary | ICD-10-CM | POA: Diagnosis not present

## 2013-11-24 DIAGNOSIS — G894 Chronic pain syndrome: Secondary | ICD-10-CM | POA: Diagnosis not present

## 2013-12-27 DIAGNOSIS — M2390 Unspecified internal derangement of unspecified knee: Secondary | ICD-10-CM | POA: Diagnosis not present

## 2013-12-27 DIAGNOSIS — G603 Idiopathic progressive neuropathy: Secondary | ICD-10-CM | POA: Diagnosis not present

## 2013-12-27 DIAGNOSIS — M1710 Unilateral primary osteoarthritis, unspecified knee: Secondary | ICD-10-CM | POA: Diagnosis not present

## 2013-12-27 DIAGNOSIS — G9009 Other idiopathic peripheral autonomic neuropathy: Secondary | ICD-10-CM | POA: Diagnosis not present

## 2014-01-24 DIAGNOSIS — G894 Chronic pain syndrome: Secondary | ICD-10-CM | POA: Diagnosis not present

## 2014-01-24 DIAGNOSIS — Z79899 Other long term (current) drug therapy: Secondary | ICD-10-CM | POA: Diagnosis not present

## 2014-01-24 DIAGNOSIS — M79609 Pain in unspecified limb: Secondary | ICD-10-CM | POA: Diagnosis not present

## 2014-01-24 DIAGNOSIS — M533 Sacrococcygeal disorders, not elsewhere classified: Secondary | ICD-10-CM | POA: Diagnosis not present

## 2014-01-24 DIAGNOSIS — M545 Low back pain, unspecified: Secondary | ICD-10-CM | POA: Diagnosis not present

## 2014-01-24 DIAGNOSIS — M25569 Pain in unspecified knee: Secondary | ICD-10-CM | POA: Diagnosis not present

## 2014-02-13 ENCOUNTER — Ambulatory Visit: Payer: Medicare Other | Attending: Anesthesiology

## 2014-02-13 DIAGNOSIS — IMO0001 Reserved for inherently not codable concepts without codable children: Secondary | ICD-10-CM | POA: Diagnosis not present

## 2014-02-13 DIAGNOSIS — R262 Difficulty in walking, not elsewhere classified: Secondary | ICD-10-CM | POA: Diagnosis not present

## 2014-02-13 DIAGNOSIS — M25669 Stiffness of unspecified knee, not elsewhere classified: Secondary | ICD-10-CM | POA: Diagnosis not present

## 2014-02-13 DIAGNOSIS — M545 Low back pain, unspecified: Secondary | ICD-10-CM | POA: Insufficient documentation

## 2014-02-13 DIAGNOSIS — M25569 Pain in unspecified knee: Secondary | ICD-10-CM | POA: Insufficient documentation

## 2014-02-20 DIAGNOSIS — M545 Low back pain, unspecified: Secondary | ICD-10-CM | POA: Diagnosis not present

## 2014-02-20 DIAGNOSIS — R29898 Other symptoms and signs involving the musculoskeletal system: Secondary | ICD-10-CM | POA: Diagnosis not present

## 2014-02-20 DIAGNOSIS — M79609 Pain in unspecified limb: Secondary | ICD-10-CM | POA: Diagnosis not present

## 2014-02-20 DIAGNOSIS — M533 Sacrococcygeal disorders, not elsewhere classified: Secondary | ICD-10-CM | POA: Diagnosis not present

## 2014-03-10 ENCOUNTER — Other Ambulatory Visit: Payer: Self-pay

## 2014-03-22 DIAGNOSIS — G8929 Other chronic pain: Secondary | ICD-10-CM | POA: Diagnosis not present

## 2014-03-22 DIAGNOSIS — Z79891 Long term (current) use of opiate analgesic: Secondary | ICD-10-CM | POA: Diagnosis not present

## 2014-04-12 DIAGNOSIS — J309 Allergic rhinitis, unspecified: Secondary | ICD-10-CM | POA: Diagnosis not present

## 2014-04-12 DIAGNOSIS — J4540 Moderate persistent asthma, uncomplicated: Secondary | ICD-10-CM | POA: Diagnosis not present

## 2014-04-24 DIAGNOSIS — R202 Paresthesia of skin: Secondary | ICD-10-CM | POA: Diagnosis not present

## 2014-04-24 DIAGNOSIS — G890 Central pain syndrome: Secondary | ICD-10-CM | POA: Diagnosis not present

## 2014-04-24 DIAGNOSIS — M545 Low back pain: Secondary | ICD-10-CM | POA: Diagnosis not present

## 2014-04-24 DIAGNOSIS — M5412 Radiculopathy, cervical region: Secondary | ICD-10-CM | POA: Diagnosis not present

## 2014-05-24 DIAGNOSIS — M4327 Fusion of spine, lumbosacral region: Secondary | ICD-10-CM | POA: Diagnosis not present

## 2014-05-24 DIAGNOSIS — G608 Other hereditary and idiopathic neuropathies: Secondary | ICD-10-CM | POA: Diagnosis not present

## 2014-05-24 DIAGNOSIS — M542 Cervicalgia: Secondary | ICD-10-CM | POA: Diagnosis not present

## 2014-05-24 DIAGNOSIS — Z79891 Long term (current) use of opiate analgesic: Secondary | ICD-10-CM | POA: Diagnosis not present

## 2014-05-24 DIAGNOSIS — M545 Low back pain: Secondary | ICD-10-CM | POA: Diagnosis not present

## 2014-05-24 DIAGNOSIS — G8929 Other chronic pain: Secondary | ICD-10-CM | POA: Diagnosis not present

## 2014-05-24 DIAGNOSIS — G894 Chronic pain syndrome: Secondary | ICD-10-CM | POA: Diagnosis not present

## 2014-05-24 DIAGNOSIS — R202 Paresthesia of skin: Secondary | ICD-10-CM | POA: Diagnosis not present

## 2014-07-05 DIAGNOSIS — R202 Paresthesia of skin: Secondary | ICD-10-CM | POA: Diagnosis not present

## 2014-07-05 DIAGNOSIS — G890 Central pain syndrome: Secondary | ICD-10-CM | POA: Diagnosis not present

## 2014-07-05 DIAGNOSIS — M545 Low back pain: Secondary | ICD-10-CM | POA: Diagnosis not present

## 2014-07-30 ENCOUNTER — Emergency Department: Payer: Self-pay | Admitting: Emergency Medicine

## 2014-07-30 DIAGNOSIS — K5792 Diverticulitis of intestine, part unspecified, without perforation or abscess without bleeding: Secondary | ICD-10-CM | POA: Diagnosis not present

## 2014-07-30 DIAGNOSIS — Z7951 Long term (current) use of inhaled steroids: Secondary | ICD-10-CM | POA: Diagnosis not present

## 2014-07-30 DIAGNOSIS — Z79899 Other long term (current) drug therapy: Secondary | ICD-10-CM | POA: Diagnosis not present

## 2014-07-30 DIAGNOSIS — K5732 Diverticulitis of large intestine without perforation or abscess without bleeding: Secondary | ICD-10-CM | POA: Diagnosis not present

## 2014-07-30 DIAGNOSIS — R1032 Left lower quadrant pain: Secondary | ICD-10-CM | POA: Diagnosis not present

## 2014-07-30 DIAGNOSIS — Z79891 Long term (current) use of opiate analgesic: Secondary | ICD-10-CM | POA: Diagnosis not present

## 2014-07-30 DIAGNOSIS — I10 Essential (primary) hypertension: Secondary | ICD-10-CM | POA: Diagnosis not present

## 2014-08-01 DIAGNOSIS — M545 Low back pain: Secondary | ICD-10-CM | POA: Diagnosis not present

## 2014-08-01 DIAGNOSIS — M25561 Pain in right knee: Secondary | ICD-10-CM | POA: Diagnosis not present

## 2014-08-01 DIAGNOSIS — M25562 Pain in left knee: Secondary | ICD-10-CM | POA: Diagnosis not present

## 2014-08-01 DIAGNOSIS — G8929 Other chronic pain: Secondary | ICD-10-CM | POA: Diagnosis not present

## 2014-08-29 DIAGNOSIS — M25561 Pain in right knee: Secondary | ICD-10-CM | POA: Diagnosis not present

## 2014-08-29 DIAGNOSIS — G8929 Other chronic pain: Secondary | ICD-10-CM | POA: Diagnosis not present

## 2014-08-29 DIAGNOSIS — G890 Central pain syndrome: Secondary | ICD-10-CM | POA: Diagnosis not present

## 2014-08-29 DIAGNOSIS — M25562 Pain in left knee: Secondary | ICD-10-CM | POA: Diagnosis not present

## 2014-08-29 DIAGNOSIS — M545 Low back pain: Secondary | ICD-10-CM | POA: Diagnosis not present

## 2014-08-30 ENCOUNTER — Encounter: Payer: Self-pay | Admitting: Internal Medicine

## 2014-08-30 ENCOUNTER — Ambulatory Visit (INDEPENDENT_AMBULATORY_CARE_PROVIDER_SITE_OTHER): Payer: Medicare Other | Admitting: Internal Medicine

## 2014-08-30 VITALS — BP 158/78 | HR 76 | Temp 98.5°F | Resp 20 | Ht 65.0 in | Wt 196.0 lb

## 2014-08-30 DIAGNOSIS — G894 Chronic pain syndrome: Secondary | ICD-10-CM | POA: Insufficient documentation

## 2014-08-30 DIAGNOSIS — E119 Type 2 diabetes mellitus without complications: Secondary | ICD-10-CM

## 2014-08-30 DIAGNOSIS — J301 Allergic rhinitis due to pollen: Secondary | ICD-10-CM | POA: Diagnosis not present

## 2014-08-30 DIAGNOSIS — K5730 Diverticulosis of large intestine without perforation or abscess without bleeding: Secondary | ICD-10-CM | POA: Diagnosis not present

## 2014-08-30 DIAGNOSIS — E785 Hyperlipidemia, unspecified: Secondary | ICD-10-CM

## 2014-08-30 DIAGNOSIS — I10 Essential (primary) hypertension: Secondary | ICD-10-CM | POA: Insufficient documentation

## 2014-08-30 DIAGNOSIS — J45909 Unspecified asthma, uncomplicated: Secondary | ICD-10-CM | POA: Insufficient documentation

## 2014-08-30 DIAGNOSIS — M545 Low back pain: Secondary | ICD-10-CM

## 2014-08-30 DIAGNOSIS — G8929 Other chronic pain: Secondary | ICD-10-CM | POA: Diagnosis not present

## 2014-08-30 MED ORDER — MONTELUKAST SODIUM 10 MG PO TABS: 10.0000 mg | ORAL_TABLET | Freq: Every day | ORAL | Status: DC

## 2014-08-30 NOTE — Progress Notes (Signed)
Patient ID: Tabitha Oneal, female   DOB: 12-09-1955, 59 y.o.   MRN: [id number]    Facility  [facility]    Place of Service:   OFFICE   No Known Allergies  Chief Complaint  Patient presents with  . Establish Care    New patient establish care, needs referral to eye doctor. Fasting if any labs due    HPI:  59 yo female seen today as a new pt. She is c/a left abdominal pain. She was tx at the ER last month for diverticulitis. She completed 10 days of flagyl and cipro. Last colonoscopy in 2007 and no polyps removed. No BRBPR or melena. She has chronic constipation and it was relieved with amitiza. She now has loose stools.  She has not seen an eye specialist in a while. Her BS 80-110s. Occasional low BS reactions. She does not recall when her last A1c was done. She takes metformin and tradjenta   Asthma is overall controlled. She has increased sx's with spring season. No exacerbations. She uses proventil HFA, qvar and astelin nasal spray. Singulair listed on EPIC but she does not recall taking that med and it is not included with her rx bottles she brought with her today.  She has left knee pain and currently wearing a knee brace. She has not seen Ortho in a few years. Takes meloxicam, baclofen, oxycodone, voltaren gel. She sees pain mx.  She takes quinapril for her BP but has not taken any this AM. She does not check her BP at home.  Past Medical History  Diagnosis Date  . Diabetes mellitus   . Hypertension   . Asthma   . Osteoarthritis of left knee 02/23/2012  . 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 Diverticulitis   . High cholesterol   . Lower back pain   . Headache   . Chronic pain    Past Surgical History  Procedure Laterality Date  . Joint replacement      acl   bil knees  . Abdominal hysterectomy    . Total knee arthroplasty  02/23/2012    Procedure: TOTAL KNEE ARTHROPLASTY;  Surgeon: Johnny Bridge, MD;  Location: [facility];  Service:  Orthopedics;  Laterality: Left;  . Knee closed reduction  04/09/2012    Procedure: CLOSED MANIPULATION KNEE;  Surgeon: Johnny Bridge, MD;  Location: [facility];  Service: Orthopedics;  Laterality: Left;  Manipulation Knee with Anesthesia includes Application of Traction   . Injection knee  04/09/2012    Procedure: KNEE INJECTION;  Surgeon: Johnny Bridge, MD;  Location: [facility];  Service: Orthopedics;  Laterality: Left;   Family History  Problem Relation Age of Onset  . Bronchitis Father 45  . Alzheimer's disease Mother   . Diabetes Daughter   . Diabetes Daughter   . Diabetes     History   Social History  . Marital Status: Widowed    Spouse Name: N/A  . Number of Children: N/A  . Years of Education: N/A   Social History Main Topics  . Smoking status: Never Smoker   . Smokeless tobacco: Not on file  . Alcohol Use: No  . Drug Use: No  . Sexual Activity: Not on file   Other Topics Concern  . None   Social History Narrative   Diet: none   Do you drink/eat things with caffeine ? Coffee    Material status: widow     What  year were you married? 08/01/1978   Do you live in a house, apartment, assisted living,condo, trailer,ect.)? Townhouse (temporary)    Is it one or more stories? Yes   How many persons live in your home? Two   Do you have any pets in your home ? Yes, Shih-tzu   Current or past profession: none   Do you exercise? No  Type & how often: no   Do you have a living will ? No   Do you have a DNR form? No   If not, do you want to discuss one?  Not now   Do you have signed POA /HPOA forms? No    If so, please bring to your appointment.                                      Medications: Patient's Medications  New Prescriptions   No medications on file  Previous Medications   ALBUTEROL (PROVENTIL HFA;VENTOLIN HFA) 108 (90 BASE) MCG/ACT INHALER    Inhale 2 puffs into the lungs every 4 (four) hours as needed. For wheezing    AMITIZA 24 MCG CAPSULE    Take 24 mcg by mouth 2 (two) times daily with a meal.    ASPIRIN-ACETAMINOPHEN-CAFFEINE (EXCEDRIN MIGRAINE) 250-250-65 MG PER TABLET    Take 2 tablets by mouth daily.   ATORVASTATIN (LIPITOR) 80 MG TABLET    Take 80 mg by mouth daily.   AZELASTINE (ASTELIN) 137 MCG/SPRAY NASAL SPRAY    Place 1 spray into the nose 2 (two) times daily. Use in each nostril as directed   BACLOFEN (LIORESAL) 10 MG TABLET    Take 10 mg by mouth. 1 in morning 2 at bed time   BECLOMETHASONE (QVAR) 80 MCG/ACT INHALER    Inhale 2 puffs into the lungs 2 (two) times daily.   DICLOFENAC SODIUM (VOLTAREN) 1 % GEL    Apply 8 g topically 2 (two) times daily. On knees   LINAGLIPTIN (TRADJENTA) 5 MG TABS TABLET    Take 5 mg by mouth. 1 by mouth daily   MELOXICAM (MOBIC) 15 MG TABLET    Take 15 mg by mouth daily.   METFORMIN (GLUCOPHAGE) 500 MG TABLET    Take 500 mg by mouth. 1 tablet 2 times daily   MONTELUKAST (SINGULAIR) 10 MG TABLET    Take 10 mg by mouth at bedtime.   ONE TOUCH ULTRA TEST TEST STRIP    Check blood sugar once daily as directed   OXYCODONE HCL 10 MG TABS    Take 10 mg by mouth 3 (three) times daily.   QUINAPRIL (ACCUPRIL) 20 MG TABLET    Take 20 mg by mouth at bedtime.  Modified Medications   No medications on file  Discontinued Medications   DIPHENHYDRAMINE HCL, SLEEP, (ZZZQUIL) 25 MG CAPS    Take 1 capsule by mouth at bedtime.   HYDROCODONE-ACETAMINOPHEN (NORCO) 5-325 MG PER TABLET    Take 1 tablet by mouth every 4 (four) hours as needed.   METRONIDAZOLE (FLAGYL) 500 MG TABLET    Take 500 mg by mouth every 8 (eight) hours.   MULTIPLE VITAMIN (MULTIVITAMIN WITH MINERALS) TABS TABLET    Take 1 tablet by mouth daily.     Review of Systems  Constitutional: Positive for diaphoresis and unexpected weight change (weight loss). Negative for fever, chills, activity change, appetite change and fatigue.  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 Negative for ear pain and sore throat.   Eyes: Negative for visual disturbance.  Respiratory: Negative for cough, chest tightness and shortness of breath.   Cardiovascular: Negative for chest pain, palpitations and leg swelling.  Gastrointestinal: Positive for nausea, vomiting, abdominal pain and constipation. Negative for diarrhea and blood in stool.       Hx diverticulosis  Genitourinary: Negative for dysuria.  Musculoskeletal: Negative for arthralgias.  Neurological: Positive for headaches (every AM). Negative for dizziness, tremors and numbness.  Psychiatric/Behavioral: Negative for sleep disturbance. The patient is not nervous/anxious.     Filed Vitals:   08/30/14 0834  BP: 158/78  Pulse: 76  Temp: 98.5 F (36.9 C)  TempSrc: Oral  Resp: 20  Height: $Remove'5\' 5"'rmJLZYq$  (1.651 m)  Weight: 196 lb (88.905 kg)  SpO2: 97%   Body mass index is 32.62 kg/(m^2).  Physical Exam  Constitutional: She is oriented to person, place, and time. She appears well-developed and well-nourished.  HENT:  Nose: Right sinus exhibits no maxillary sinus tenderness and no frontal sinus tenderness. Left sinus exhibits no maxillary sinus tenderness and no frontal sinus tenderness.  Mouth/Throat: Oropharynx is clear and moist. No oropharyngeal exudate.  Eyes: Pupils are equal, round, and reactive to light. No scleral icterus.  Neck: Neck supple. No tracheal deviation present. No thyromegaly present.  Cardiovascular: Normal rate, regular rhythm, normal heart sounds and intact distal pulses.  Exam reveals no gallop and no friction rub.   No murmur heard. No LE edema b/l. no calf TTP. No carotid bruit b/l  Pulmonary/Chest: Effort normal and breath sounds normal. No stridor. No respiratory distress. She has no wheezes. She has no rales.  Abdominal: Soft. Bowel sounds are normal. She exhibits no distension and no mass. There is no tenderness. There is no rebound and no guarding.    Musculoskeletal: She exhibits edema (right knee with reduced ROM; knee brace intact) and tenderness.  Lymphadenopathy:    She has no cervical adenopathy.  Neurological: She is alert and oriented to person, place, and time. She has normal reflexes.  Skin: Skin is warm and dry. No rash noted.  Psychiatric: She has a normal mood and affect. Her behavior is normal. Judgment and thought content normal.     Labs reviewed: No visits with results within 3 Month(s) from this visit. Latest known visit with results is:  Admission on 08/23/2013, Discharged on 08/24/2013  Component Date Value Ref Range Status  . WBC 08/23/2013 8.0  4.0 - 10.5 K/uL Final  . RBC 08/23/2013 4.34  3.87 - 5.11 MIL/uL Final  . Hemoglobin 08/23/2013 11.3* 12.0 - 15.0 g/dL Final  . HCT 08/23/2013 34.3* 36.0 - 46.0 % Final  . MCV 08/23/2013 79.0  78.0 - 100.0 fL Final  . MCH 08/23/2013 26.0  26.0 - 34.0 pg Final  . MCHC 08/23/2013 32.9  30.0 - 36.0 g/dL Final  . RDW 08/23/2013 14.4  11.5 - 15.5 % Final  . Platelets 08/23/2013 258  150 - 400 K/uL Final  . Neutrophils Relative % 08/23/2013 89* 43 - 77 % Final  . Neutro Abs 08/23/2013 7.1  1.7 - 7.7 K/uL Final  . Lymphocytes Relative 08/23/2013 6* 12 - 46 % Final  . Lymphs Abs 08/23/2013 0.5* 0.7 - 4.0 K/uL Final  . Monocytes Relative 08/23/2013 5  3 - 12 % Final  . Monocytes Absolute 08/23/2013 0.4  0.1 - 1.0 K/uL Final  . Eosinophils  Relative 08/23/2013 0  0 - 5 % Final  . Eosinophils Absolute 08/23/2013 0.0  0.0 - 0.7 K/uL Final  . Basophils Relative 08/23/2013 0  0 - 1 % Final  . Basophils Absolute 08/23/2013 0.0  0.0 - 0.1 K/uL Final  . Sodium 08/23/2013 143  137 - 147 mEq/L Final  . Potassium 08/23/2013 3.9  3.7 - 5.3 mEq/L Final  . Chloride 08/23/2013 102  96 - 112 mEq/L Final  . CO2 08/23/2013 26  19 - 32 mEq/L Final  . Glucose, Bld 08/23/2013 105* 70 - 99 mg/dL Final  . BUN 08/23/2013 8  6 - 23 mg/dL Final  . Creatinine, Ser 08/23/2013 0.74  0.50 - 1.10  mg/dL Final  . Calcium 08/23/2013 10.2  8.4 - 10.5 mg/dL Final  . Total Protein 08/23/2013 8.4* 6.0 - 8.3 g/dL Final  . Albumin 08/23/2013 4.4  3.5 - 5.2 g/dL Final  . AST 08/23/2013 19  0 - 37 U/L Final  . ALT 08/23/2013 17  0 - 35 U/L Final  . Alkaline Phosphatase 08/23/2013 134* 39 - 117 U/L Final  . Total Bilirubin 08/23/2013 0.6  0.3 - 1.2 mg/dL Final  . GFR calc non Af Amer 08/23/2013 >90  >90 mL/min Final  . GFR calc Af Amer 08/23/2013 >90  >90 mL/min Final   Comment: (NOTE)                          The eGFR has been calculated using the CKD EPI equation.                          This calculation has not been validated in all clinical situations.                          eGFR's persistently <90 mL/min signify possible Chronic Kidney                          Disease.  . Lipase 08/23/2013 16  11 - 59 U/L Final  . Color, Urine 08/23/2013 YELLOW  YELLOW Final  . APPearance 08/23/2013 CLOUDY* CLEAR Final  . Specific Gravity, Urine 08/23/2013 1.024  1.005 - 1.030 Final  . pH 08/23/2013 6.0  5.0 - 8.0 Final  . Glucose, UA 08/23/2013 NEGATIVE  NEGATIVE mg/dL Final  . Hgb urine dipstick 08/23/2013 NEGATIVE  NEGATIVE Final  . Bilirubin Urine 08/23/2013 NEGATIVE  NEGATIVE Final  . Ketones, ur 08/23/2013 NEGATIVE  NEGATIVE mg/dL Final  . Protein, ur 08/23/2013 NEGATIVE  NEGATIVE mg/dL Final  . Urobilinogen, UA 08/23/2013 1.0  0.0 - 1.0 mg/dL Final  . Nitrite 08/23/2013 NEGATIVE  NEGATIVE Final  . Leukocytes, UA 08/23/2013 SMALL* NEGATIVE Final  . Squamous Epithelial / LPF 08/23/2013 RARE  RARE Final  . WBC, UA 08/23/2013 11-20  <3 WBC/hpf Final  . Bacteria, UA 08/23/2013 FEW* RARE Final  . Specimen Description 08/23/2013 URINE, CLEAN CATCH   Final  . Special Requests 08/23/2013 NONE   Final  . Culture  Setup Time 08/23/2013    Final                   Value:08/24/2013 03:41                         Performed at [facility]  . Colony Count 08/23/2013  Final                    Value:>=100,000 COLONIES/ML                         Performed at [facility]  . Culture 08/23/2013    Final                   Value:ESCHERICHIA COLI                         Performed at [facility]  . Report Status 08/23/2013 08/26/2013 FINAL   Final  . Organism ID, Bacteria 08/23/2013 ESCHERICHIA COLI   Final     Assessment/Plan   ICD-9-CM ICD-10-CM   1. Essential hypertension, benign - control status unknown as she did not take med this AM 401.1 I10 CBC with Differential  2. Type 2 diabetes mellitus without complication - on metformin/tradjenta; control status unknown 250.00 E11.9 CBC with Differential     CMP     Lipid Panel     Microalbumin/Creatinine Ratio, Urine     Urinalysis with Reflex Microscopic     Hemoglobin A1c  3. Chronic pain syndrome - stable on current pain regiman 338.4 G89.4   4. Diverticulosis of colon without hemorrhage s/p diverticulitis flare 562.10 K57.30   5. Chronic low back pain stable on pain meds 724.2 M54.5    338.29 G89.29   6. Asthma in adult without complication- stable on qvar, proventil hfa 493.90 J45.909 montelukast (SINGULAIR) 10 MG tablet  7. Allergic rhinitis due to pollen - on astelin 477.0 J30.1 montelukast (SINGULAIR) 10 MG tablet  8. Hyperlipidemia LDL goal <100 - on lipitor 272.4 E78.5 Lipid Panel     TSH     --get old records  --f/u with pain mx as scheduled  --Rx for singular written  --refer to ophthamology for diabetic exam  --RTO in 1 month for CPE wpap   Tabitha Oneal  [facility] [address] [place], [place] [address] [phone] Office (Wednesdays and Fridays 8 AM - 5 PM) [phone] Cell (Monday-Friday 8 AM - 5 PM)

## 2014-08-30 NOTE — Patient Instructions (Signed)
Continue current medications as ordered  Follow up with pain management as scheduled  Return to office in 1 month for CPE

## 2014-08-31 LAB — HEMOGLOBIN A1C
ESTIMATED AVERAGE GLUCOSE: 143 mg/dL
Hgb A1c MFr Bld: 6.6 % — ABNORMAL HIGH (ref 4.8–5.6)

## 2014-08-31 LAB — CBC WITH DIFFERENTIAL/PLATELET
BASOS ABS: 0.1 10*3/uL (ref 0.0–0.2)
Basos: 1 %
EOS: 3 %
Eosinophils Absolute: 0.2 10*3/uL (ref 0.0–0.4)
HEMATOCRIT: 36.3 % (ref 34.0–46.6)
Hemoglobin: 11.7 g/dL (ref 11.1–15.9)
Immature Grans (Abs): 0 10*3/uL (ref 0.0–0.1)
Immature Granulocytes: 0 %
LYMPHS ABS: 1.7 10*3/uL (ref 0.7–3.1)
Lymphs: 24 %
MCH: 25.3 pg — AB (ref 26.6–33.0)
MCHC: 32.2 g/dL (ref 31.5–35.7)
MCV: 78 fL — AB (ref 79–97)
MONOS ABS: 0.3 10*3/uL (ref 0.1–0.9)
Monocytes: 5 %
Neutrophils Absolute: 4.9 10*3/uL (ref 1.4–7.0)
Neutrophils Relative %: 67 %
Platelets: 314 10*3/uL (ref 150–379)
RBC: 4.63 x10E6/uL (ref 3.77–5.28)
RDW: 15.2 % (ref 12.3–15.4)
WBC: 7.3 10*3/uL (ref 3.4–10.8)

## 2014-08-31 LAB — URINALYSIS, ROUTINE W REFLEX MICROSCOPIC
Bilirubin, UA: NEGATIVE
Glucose, UA: NEGATIVE
Ketones, UA: NEGATIVE
Leukocytes, UA: NEGATIVE
Nitrite, UA: NEGATIVE
PROTEIN UA: NEGATIVE
RBC, UA: NEGATIVE
Specific Gravity, UA: 1.013 (ref 1.005–1.030)
Urobilinogen, Ur: 0.2 mg/dL (ref 0.2–1.0)
pH, UA: 5.5 (ref 5.0–7.5)

## 2014-08-31 LAB — COMPREHENSIVE METABOLIC PANEL
A/G RATIO: 1.5 (ref 1.1–2.5)
ALK PHOS: 145 IU/L — AB (ref 39–117)
ALT: 18 IU/L (ref 0–32)
AST: 16 IU/L (ref 0–40)
Albumin: 4.6 g/dL (ref 3.5–5.5)
BILIRUBIN TOTAL: 0.3 mg/dL (ref 0.0–1.2)
BUN / CREAT RATIO: 11 (ref 9–23)
BUN: 8 mg/dL (ref 6–24)
CHLORIDE: 99 mmol/L (ref 97–108)
CO2: 27 mmol/L (ref 18–29)
Calcium: 9.9 mg/dL (ref 8.7–10.2)
Creatinine, Ser: 0.71 mg/dL (ref 0.57–1.00)
GFR calc Af Amer: 108 mL/min/{1.73_m2} (ref >59)
GFR calc non Af Amer: 94 mL/min/{1.73_m2} (ref >59)
Globulin, Total: 3.1 g/dL (ref 1.5–4.5)
Glucose: 86 mg/dL (ref 65–99)
POTASSIUM: 4 mmol/L (ref 3.5–5.2)
SODIUM: 140 mmol/L (ref 134–144)
Total Protein: 7.7 g/dL (ref 6.0–8.5)

## 2014-08-31 LAB — LIPID PANEL
Chol/HDL Ratio: 2.4 ratio units (ref 0.0–4.4)
Cholesterol, Total: 156 mg/dL (ref 100–199)
HDL: 65 mg/dL (ref >39)
LDL Calculated: 76 mg/dL (ref 0–99)
TRIGLYCERIDES: 75 mg/dL (ref 0–149)
VLDL Cholesterol Cal: 15 mg/dL (ref 5–40)

## 2014-08-31 LAB — MICROALBUMIN / CREATININE URINE RATIO
Creatinine, Urine: 83.6 mg/dL (ref 15.0–278.0)
MICROALB/CREAT RATIO: 18.5 mg/g creat (ref 0.0–30.0)
Microalbumin, Urine: 15.5 ug/mL (ref 0.0–17.0)

## 2014-08-31 LAB — TSH: TSH: 0.624 u[IU]/mL (ref 0.450–4.500)

## 2014-09-04 ENCOUNTER — Encounter: Payer: Self-pay | Admitting: *Deleted

## 2014-09-04 DIAGNOSIS — H2513 Age-related nuclear cataract, bilateral: Secondary | ICD-10-CM | POA: Diagnosis not present

## 2014-09-04 DIAGNOSIS — E119 Type 2 diabetes mellitus without complications: Secondary | ICD-10-CM | POA: Diagnosis not present

## 2014-09-04 LAB — HM DIABETES EYE EXAM

## 2014-09-15 ENCOUNTER — Other Ambulatory Visit: Payer: Self-pay

## 2014-09-15 MED ORDER — ATORVASTATIN CALCIUM 80 MG PO TABS: 80.0000 mg | ORAL_TABLET | Freq: Every day | ORAL | Status: DC

## 2014-09-21 ENCOUNTER — Other Ambulatory Visit: Payer: Self-pay

## 2014-09-21 ENCOUNTER — Encounter: Payer: Self-pay | Admitting: Internal Medicine

## 2014-09-21 MED ORDER — QUINAPRIL HCL 20 MG PO TABS: 20.0000 mg | ORAL_TABLET | Freq: Every day | ORAL | Status: DC

## 2014-09-28 DIAGNOSIS — M25552 Pain in left hip: Secondary | ICD-10-CM | POA: Diagnosis not present

## 2014-09-28 DIAGNOSIS — M545 Low back pain: Secondary | ICD-10-CM | POA: Diagnosis not present

## 2014-09-28 DIAGNOSIS — M25551 Pain in right hip: Secondary | ICD-10-CM | POA: Diagnosis not present

## 2014-09-28 DIAGNOSIS — M25562 Pain in left knee: Secondary | ICD-10-CM | POA: Diagnosis not present

## 2014-09-28 DIAGNOSIS — G8929 Other chronic pain: Secondary | ICD-10-CM | POA: Diagnosis not present

## 2014-09-28 DIAGNOSIS — M25561 Pain in right knee: Secondary | ICD-10-CM | POA: Diagnosis not present

## 2014-10-05 ENCOUNTER — Other Ambulatory Visit: Payer: Self-pay

## 2014-10-05 DIAGNOSIS — Z1231 Encounter for screening mammogram for malignant neoplasm of breast: Secondary | ICD-10-CM

## 2014-10-06 ENCOUNTER — Ambulatory Visit (INDEPENDENT_AMBULATORY_CARE_PROVIDER_SITE_OTHER): Payer: Medicare Other | Admitting: Internal Medicine

## 2014-10-06 ENCOUNTER — Encounter: Payer: Self-pay | Admitting: Internal Medicine

## 2014-10-06 VITALS — BP 180/90 | HR 64 | Temp 98.1°F | Resp 18 | Ht 65.0 in | Wt 194.2 lb

## 2014-10-06 DIAGNOSIS — E119 Type 2 diabetes mellitus without complications: Secondary | ICD-10-CM

## 2014-10-06 DIAGNOSIS — E785 Hyperlipidemia, unspecified: Secondary | ICD-10-CM

## 2014-10-06 DIAGNOSIS — G894 Chronic pain syndrome: Secondary | ICD-10-CM | POA: Diagnosis not present

## 2014-10-06 DIAGNOSIS — Z124 Encounter for screening for malignant neoplasm of cervix: Secondary | ICD-10-CM | POA: Diagnosis not present

## 2014-10-06 DIAGNOSIS — M25552 Pain in left hip: Secondary | ICD-10-CM

## 2014-10-06 DIAGNOSIS — J45909 Unspecified asthma, uncomplicated: Secondary | ICD-10-CM

## 2014-10-06 DIAGNOSIS — I10 Essential (primary) hypertension: Secondary | ICD-10-CM

## 2014-10-06 DIAGNOSIS — Z0000 Encounter for general adult medical examination without abnormal findings: Secondary | ICD-10-CM | POA: Diagnosis not present

## 2014-10-06 NOTE — Patient Instructions (Signed)
Encouraged her to exercise 30-45 minutes 4-5 times per week. Eat a well balanced diet. Avoid smoking. Limit alcohol intake. Wear seatbelt when riding in the car. Wear sun block (SPF >50) when spending extended times outside.  Continue current medications as ordered  Will call with xray results  Follow up in 3 mos

## 2014-10-06 NOTE — Progress Notes (Signed)
Patient ID: Tabitha Oneal, female   DOB: 09-03-1955, 59 y.o.   MRN: [id number] Subjective:     Tabitha Oneal is a 59 y.o. female and is here for a comprehensive physical exam. The patient reports she fell last week while reaching for curtain while standing on a step stool. No head trauma but did hit right hip and right elbow/shoulder. She had a bruise on hip and elbow. Bruise resolved on elbow but not hip.  She c/o left hip throbbing and inhibits ability to get up from seated position some times. Currently takes oxycodone and meloxicam for pain and uses voltaren gel.  BS stable at home. No low BS reactions.  No numbness or tingling.  She takes her BP meds as Rx but did not take any this AM as she was fasting for CPE today.  She takes lipitor for hyperlipidemia. No myalgias.   She does have arthralgias that bother her at night. She takes pain meds with relief as well as baclofen.  Constipation controlled with prn amitiza  History   Past Medical History  Diagnosis Date  . Diabetes mellitus   . Hypertension   . Asthma   . Osteoarthritis of left knee 02/23/2012  . 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 Diverticulitis   . High cholesterol   . Lower back pain   . Headache   . Chronic pain    Past Surgical History  Procedure Laterality Date  . Joint replacement      acl   bil knees  . Abdominal hysterectomy    . Total knee arthroplasty  02/23/2012    Procedure: TOTAL KNEE ARTHROPLASTY;  Surgeon: Johnny Bridge, MD;  Location: [facility];  Service: Orthopedics;  Laterality: Left;  . Knee closed reduction  04/09/2012    Procedure: CLOSED MANIPULATION KNEE;  Surgeon: Johnny Bridge, MD;  Location: [facility];  Service: Orthopedics;  Laterality: Left;  Manipulation Knee with Anesthesia includes Application of Traction   . Injection knee  04/09/2012    Procedure: KNEE INJECTION;  Surgeon: Johnny Bridge, MD;  Location: [facility];  Service: Orthopedics;  Laterality: Left;   Family History  Problem Relation Age of Onset  . Bronchitis Father 44  . Alzheimer's disease Mother   . Diabetes Daughter   . Diabetes Daughter   . Diabetes      Social History  . Marital Status: Widowed    Spouse Name: N/A  . Number of Children: N/A  . Years of Education: N/A   Occupational History  . Not on file.   Social History Main Topics  . Smoking status: Never Smoker   . Smokeless tobacco: Never Used  . Alcohol Use: No  . Drug Use: No  . Sexual Activity: Not on file   Other Topics Concern  . Not on file   Social History Narrative   Diet: none   Do you drink/eat things with caffeine ? Coffee    Material status: widow     What year were you married? 08/01/1978   Do you live in a house, apartment, assisted living,condo, trailer,ect.)? Townhouse (temporary)    Is it one or more stories? Yes   How many persons live in your home? Two   Do you have any pets in your home ? Yes, Shih-tzu   Current or past profession: none   Do you exercise? No  Type & how often: no   Do you  have a living will ? No   Do you have a DNR form? No   If not, do you want to discuss one?  Not now   Do you have signed POA /HPOA forms? No    If so, please bring to your appointment.                                    Health Maintenance  Topic Date Due  . PNEUMOCOCCAL POLYSACCHARIDE VACCINE (1) 12/17/1956  . FOOT EXAM  12/17/1964  . HIV Screening  12/17/1969  . PAP SMEAR  12/17/1972  . TETANUS/TDAP  12/17/1973  . COLONOSCOPY  12/17/2004  . INFLUENZA VACCINE  12/25/2014  . HEMOGLOBIN A1C  03/01/2015  . URINE MICROALBUMIN  08/30/2015  . OPHTHALMOLOGY EXAM  09/04/2015  . MAMMOGRAM  11/08/2015     Review of Systems   Review of Systems  Constitutional: Negative for fever, chills and malaise/fatigue.  HENT: Negative for sore throat and tinnitus.   Eyes: Negative for blurred vision and double vision.  Respiratory: Negative for  cough, shortness of breath and wheezing.   Cardiovascular: Negative for chest pain, palpitations, orthopnea and leg swelling.  Gastrointestinal: Negative for heartburn, nausea, vomiting, abdominal pain, diarrhea, constipation and blood in stool.  Genitourinary: Negative for dysuria, urgency, frequency and hematuria.  Musculoskeletal: Negative for myalgias, joint pain and falls.  Skin: Negative for rash.  Neurological: Negative for dizziness, tingling, tremors, sensory change, focal weakness, seizures, loss of consciousness, weakness and headaches.  Endo/Heme/Allergies: Negative for environmental allergies. Does not bruise/bleed easily.  Psychiatric/Behavioral: Negative for depression and memory loss. The patient is not nervous/anxious and does not have insomnia.      Objective:     Physical Exam  Constitutional: She is oriented to person, place, and time and well-developed, well-nourished, and in no distress.  HENT:  Head: Normocephalic and atraumatic.  Right Ear: External ear normal.  Left Ear: External ear normal.  Mouth/Throat: Oropharynx is clear and moist. No oropharyngeal exudate.  Eyes: Conjunctivae and EOM are normal. Pupils are equal, round, and reactive to light. No scleral icterus.  Neck: Normal range of motion. Neck supple. No tracheal deviation present. No thyromegaly present.  Cardiovascular: Normal rate, regular rhythm, normal heart sounds and intact distal pulses.  Exam reveals no gallop and no friction rub.   No murmur heard. Pulmonary/Chest: Effort normal and breath sounds normal. She has no wheezes. She has no rales. She exhibits no tenderness. Right breast exhibits tenderness. Right breast exhibits no inverted nipple, no mass, no nipple discharge and no skin change. Left breast exhibits no inverted nipple, no mass, no nipple discharge, no skin change and no tenderness. Breasts are symmetrical.  Abdominal: Soft. Bowel sounds are normal. She exhibits no distension and no  mass. There is no hepatosplenomegaly. There is no tenderness. There is no rebound and no guarding.  Genitourinary: Rectum normal, vagina normal, right adnexa normal, left adnexa normal and vulva normal. Rectal exam shows no external hemorrhoid, no internal hemorrhoid, no fissure, no laceration, no mass and no tenderness. Guaiac negative stool. No vaginal discharge found.  Uterine fundus/cervix absent. Pap taken using spatula  Musculoskeletal:       Left hip: She exhibits decreased range of motion, decreased strength and tenderness. She exhibits no swelling.       Left knee: She exhibits decreased range of motion (in flexion) and swelling.  Legs: Left pelvic inflare  Lymphadenopathy:    She has no cervical adenopathy.  Neurological: She is alert and oriented to person, place, and time. She has normal reflexes. Gait normal.  Skin: Skin is warm and dry. No rash noted.  Psychiatric: Mood, memory, affect and judgment normal.   Diabetic Foot Exam - Simple   Simple Foot Form  Diabetic Foot exam was performed with the following findings:  Yes 10/06/2014 11:19 AM  Visual Inspection  No deformities, no ulcerations, no other skin breakdown bilaterally:  Yes  Sensation Testing  Intact to touch and monofilament testing bilaterally:  Yes  Pulse Check  Posterior Tibialis and Dorsalis pulse intact bilaterally:  Yes  Comments     CBC Latest Ref Rng 08/30/2014 08/23/2013 08/23/2013  WBC 3.4 - 10.8 x10E3/uL 7.3 8.0 9.0  Hemoglobin 11.1 - 15.9 g/dL 11.7 11.3(L) 11.0(L)  Hematocrit 34.0 - 46.6 % 36.3 34.3(L) 34.1(L)  Platelets 150 - 379 x10E3/uL 314 258 259    CMP Latest Ref Rng 08/30/2014 08/23/2013 08/23/2013  Glucose 65 - 99 mg/dL 86 105(H) 111(H)  BUN 6 - 24 mg/dL 8 8 8   Creatinine 0.57 - 1.00 mg/dL 0.71 0.74 0.74  Sodium 134 - 144 mmol/L 140 143 143  Potassium 3.5 - 5.2 mmol/L 4.0 3.9 4.2  Chloride 97 - 108 mmol/L 99 102 101  CO2 18 - 29 mmol/L 27 26 27   Calcium 8.7 - 10.2 mg/dL 9.9 10.2 9.6    Total Protein 6.0 - 8.5 g/dL 7.7 8.4(H) 7.9  Albumin 3.5 - 5.5 g/dL 4.6 - -  Total Bilirubin 0.0 - 1.2 mg/dL 0.3 0.6 0.5  Alkaline Phos 39 - 117 IU/L 145(H) 134(H) 126(H)  AST 0 - 40 IU/L 16 19 18   ALT 0 - 32 IU/L 18 17 17     Lipid Panel     Component Value Date/Time   CHOL 156 08/30/2014 1001   TRIG 75 08/30/2014 1001   HDL 65 08/30/2014 1001   CHOLHDL 2.4 08/30/2014 1001   LDLCALC 76 08/30/2014 1001   Hgb A1c 6.6% (08/30/14)  Urinalysis    Component Value Date/Time   COLORURINE YELLOW 08/23/2013 2008   APPEARANCEUR CLOUDY* 08/23/2013 2008   LABSPEC 1.024 08/23/2013 2008   PHURINE 6.0 08/23/2013 2008   GLUCOSEU Negative 08/30/2014 1005   HGBUR NEGATIVE 08/23/2013 2008   BILIRUBINUR Negative 08/30/2014 [place] NEGATIVE 08/23/2013 2008   KETONESUR NEGATIVE 08/23/2013 2008   PROTEINUR NEGATIVE 08/23/2013 2008   UROBILINOGEN 1.0 08/23/2013 2008   NITRITE Negative 08/30/2014 1005   NITRITE NEGATIVE 08/23/2013 2008   LEUKOCYTESUR Negative 08/30/2014 1005   LEUKOCYTESUR SMALL* 08/23/2013 2008    Urine microalbumin/creatinine ratio 18.5 (08/30/14)    Assessment:        ICD-9-CM ICD-10-CM   1. Well adult exam V70.0 Z00.00   2. Routine cervical smear V76.2 Z12.4 PAP, Image Guided [[facility], [facility]]  3. Left hip pain - probable arthritis vs bursitis 719.45 M25.552 DG Arthro Hip Left     DG Pelvis Comp Min 3V  4. Essential hypertension, benign - elevated BP today as she has not taken meds yet; cont current meds 401.1 R15 Basic Metabolic Panel  5. Type 2 diabetes mellitus without complication - controlled on meds 250.00 Q00.8 Basic Metabolic Panel     ALT     Hemoglobin A1c  6. Chronic pain syndrome 338.4 G89.4   7. Hyperlipidemia LDL goal <100 272.4 E78.5 Lipid Panel  8. Asthma in adult without complication 676.19 J09.326  Plan:     Pt is UTD on health maintenance. Vaccinations are UTD. Pt maintains a healthy lifestyle. Encouraged pt to exercise 30-45  minutes 4-5 times per week. Eat a well balanced diet. Avoid smoking. Limit alcohol intake. Wear seatbelt when riding in the car. Wear sun block (SPF >50) when spending extended times outside.  Continue current medications as ordered  Will call with xray results  Follow up in 3 mos. Fasting labs prior to next visit.  Pleshette Tomasini S. Perlie Gold  [facility] [address] [place], [place] [address] [phone] Cell (Monday-Friday 8 AM - 5 PM) [phone] After 5 PM and follow prompts

## 2014-10-08 DIAGNOSIS — E119 Type 2 diabetes mellitus without complications: Secondary | ICD-10-CM | POA: Insufficient documentation

## 2014-10-08 DIAGNOSIS — E1169 Type 2 diabetes mellitus with other specified complication: Secondary | ICD-10-CM | POA: Insufficient documentation

## 2014-10-08 DIAGNOSIS — M25552 Pain in left hip: Secondary | ICD-10-CM | POA: Insufficient documentation

## 2014-10-08 DIAGNOSIS — E785 Hyperlipidemia, unspecified: Secondary | ICD-10-CM | POA: Insufficient documentation

## 2014-10-09 LAB — PAP IG (IMAGE GUIDED): PAP SMEAR COMMENT: 0

## 2014-10-11 DIAGNOSIS — J309 Allergic rhinitis, unspecified: Secondary | ICD-10-CM | POA: Diagnosis not present

## 2014-10-11 DIAGNOSIS — J4530 Mild persistent asthma, uncomplicated: Secondary | ICD-10-CM | POA: Diagnosis not present

## 2014-10-13 NOTE — Addendum Note (Signed)
Addended by: Gildardo Cranker on: 10/13/2014 05:31 PM   Modules accepted: Level of Service

## 2014-10-20 ENCOUNTER — Encounter: Payer: Self-pay | Admitting: *Deleted

## 2014-10-20 ENCOUNTER — Ambulatory Visit
Admission: RE | Admit: 2014-10-20 | Discharge: 2014-10-20 | Disposition: A | Discharge status: Home / Self Care | Payer: Medicare Other | Source: Ambulatory Visit | Attending: Internal Medicine | Admitting: Internal Medicine

## 2014-10-20 ENCOUNTER — Other Ambulatory Visit: Payer: Self-pay

## 2014-10-20 DIAGNOSIS — M160 Bilateral primary osteoarthritis of hip: Secondary | ICD-10-CM | POA: Diagnosis not present

## 2014-10-20 DIAGNOSIS — M25552 Pain in left hip: Secondary | ICD-10-CM

## 2014-10-20 MED ORDER — LINAGLIPTIN 5 MG PO TABS: ORAL_TABLET | ORAL | Status: DC

## 2014-10-20 NOTE — Progress Notes (Signed)
This encounter was created in error - please disregard.

## 2014-10-24 ENCOUNTER — Telehealth: Payer: Self-pay

## 2014-10-24 DIAGNOSIS — G8929 Other chronic pain: Secondary | ICD-10-CM | POA: Diagnosis not present

## 2014-10-24 DIAGNOSIS — M25561 Pain in right knee: Secondary | ICD-10-CM | POA: Diagnosis not present

## 2014-10-24 DIAGNOSIS — M160 Bilateral primary osteoarthritis of hip: Secondary | ICD-10-CM

## 2014-10-24 DIAGNOSIS — M545 Low back pain: Secondary | ICD-10-CM | POA: Diagnosis not present

## 2014-10-24 DIAGNOSIS — M25552 Pain in left hip: Secondary | ICD-10-CM | POA: Diagnosis not present

## 2014-10-24 DIAGNOSIS — Z79891 Long term (current) use of opiate analgesic: Secondary | ICD-10-CM | POA: Diagnosis not present

## 2014-10-24 DIAGNOSIS — M25562 Pain in left knee: Secondary | ICD-10-CM | POA: Diagnosis not present

## 2014-10-24 NOTE — Telephone Encounter (Signed)
Discussed with patient, patient verbalized understanding of results. Patient in agreement with referral- order placed.

## 2014-10-24 NOTE — Telephone Encounter (Signed)
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 Recommend ortho follow up for further treatment options

## 2014-11-10 ENCOUNTER — Ambulatory Visit
Admission: RE | Admit: 2014-11-10 | Discharge: 2014-11-10 | Disposition: A | Discharge status: Home / Self Care | Payer: Medicare Other | Source: Ambulatory Visit

## 2014-11-10 DIAGNOSIS — Z1231 Encounter for screening mammogram for malignant neoplasm of breast: Secondary | ICD-10-CM

## 2014-11-19 DIAGNOSIS — M545 Low back pain: Secondary | ICD-10-CM | POA: Diagnosis not present

## 2014-11-19 DIAGNOSIS — G890 Central pain syndrome: Secondary | ICD-10-CM | POA: Diagnosis not present

## 2014-11-22 DIAGNOSIS — G890 Central pain syndrome: Secondary | ICD-10-CM | POA: Diagnosis not present

## 2014-11-22 DIAGNOSIS — R202 Paresthesia of skin: Secondary | ICD-10-CM | POA: Diagnosis not present

## 2014-11-22 DIAGNOSIS — M545 Low back pain: Secondary | ICD-10-CM | POA: Diagnosis not present

## 2014-11-22 DIAGNOSIS — M542 Cervicalgia: Secondary | ICD-10-CM | POA: Diagnosis not present

## 2014-12-08 DIAGNOSIS — M25562 Pain in left knee: Secondary | ICD-10-CM | POA: Diagnosis not present

## 2014-12-08 DIAGNOSIS — M25559 Pain in unspecified hip: Secondary | ICD-10-CM | POA: Diagnosis not present

## 2014-12-20 DIAGNOSIS — G541 Lumbosacral plexus disorders: Secondary | ICD-10-CM | POA: Diagnosis not present

## 2014-12-20 DIAGNOSIS — G603 Idiopathic progressive neuropathy: Secondary | ICD-10-CM | POA: Diagnosis not present

## 2014-12-20 DIAGNOSIS — M79604 Pain in right leg: Secondary | ICD-10-CM | POA: Diagnosis not present

## 2014-12-20 DIAGNOSIS — R202 Paresthesia of skin: Secondary | ICD-10-CM | POA: Diagnosis not present

## 2014-12-20 DIAGNOSIS — G890 Central pain syndrome: Secondary | ICD-10-CM | POA: Diagnosis not present

## 2014-12-20 DIAGNOSIS — M79652 Pain in left thigh: Secondary | ICD-10-CM | POA: Diagnosis not present

## 2014-12-20 DIAGNOSIS — M79661 Pain in right lower leg: Secondary | ICD-10-CM | POA: Diagnosis not present

## 2014-12-20 DIAGNOSIS — M5417 Radiculopathy, lumbosacral region: Secondary | ICD-10-CM | POA: Diagnosis not present

## 2014-12-20 DIAGNOSIS — Z79891 Long term (current) use of opiate analgesic: Secondary | ICD-10-CM | POA: Diagnosis not present

## 2014-12-21 ENCOUNTER — Other Ambulatory Visit: Payer: Self-pay

## 2014-12-21 ENCOUNTER — Encounter: Payer: Self-pay | Admitting: Internal Medicine

## 2014-12-21 ENCOUNTER — Other Ambulatory Visit: Payer: Self-pay | Admitting: *Deleted

## 2014-12-21 DIAGNOSIS — J301 Allergic rhinitis due to pollen: Secondary | ICD-10-CM

## 2014-12-21 DIAGNOSIS — J45909 Unspecified asthma, uncomplicated: Secondary | ICD-10-CM

## 2014-12-21 MED ORDER — MONTELUKAST SODIUM 10 MG PO TABS: 10.0000 mg | ORAL_TABLET | Freq: Every day | ORAL | Status: DC

## 2014-12-21 MED ORDER — QUINAPRIL HCL 20 MG PO TABS: 20.0000 mg | ORAL_TABLET | Freq: Every day | ORAL | Status: DC

## 2014-12-21 NOTE — Telephone Encounter (Signed)
Patient requested refills to be faxed to pharmacy.

## 2014-12-26 ENCOUNTER — Encounter: Payer: Self-pay | Admitting: Rehabilitative and Restorative Service Providers"

## 2014-12-26 ENCOUNTER — Ambulatory Visit: Payer: Medicare Other | Attending: Orthopedic Surgery | Admitting: Rehabilitative and Restorative Service Providers"

## 2014-12-26 DIAGNOSIS — M79605 Pain in left leg: Secondary | ICD-10-CM | POA: Insufficient documentation

## 2014-12-26 DIAGNOSIS — R269 Unspecified abnormalities of gait and mobility: Secondary | ICD-10-CM | POA: Diagnosis not present

## 2014-12-26 NOTE — Patient Instructions (Signed)
Discussed with pt need to wear sneakers next visit and loosely fitting pants so PT can gain access to knee; discussed limiting sway/hip hike with gait on L due to current gait pattern further tightening L LE musculature which is contributing to pain; discussed possible need of MRI of her back and hip (MD was going to just get hip MRI) to determine if pain coming from back; at end of eval, advised pt to perform therapy for about 4 visits prior to MRI to see if therapy would help with pain. Pt in agreement

## 2014-12-26 NOTE — Therapy (Signed)
[place], [place], [address] Phone: [phone]   Fax:  [phone]  Physical Therapy Evaluation  Patient Details  Name: Tabitha Oneal MRN: [id number] Date of Birth: 1955-08-13 Referring Provider:  Meredith Pel, MD  Encounter Date: 12/26/2014      PT End of Session - 12/26/14 1008    Visit Number 1   Number of Visits 8   Date for PT Re-Evaluation 01/23/15   PT Start Time 0900   PT Stop Time 0955   PT Time Calculation (min) 55 min   Equipment Utilized During Treatment Other (comment)   Activity Tolerance Patient tolerated treatment well   Behavior During Therapy [facility] for tasks assessed/performed      Past Medical History  Diagnosis Date  . Diabetes mellitus   . Hypertension   . Asthma   . Osteoarthritis of left knee 02/23/2012  . 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 Diverticulitis   . High cholesterol   . Lower back pain   . Headache   . Chronic pain     Past Surgical History  Procedure Laterality Date  . Joint replacement      acl   bil knees  . Abdominal hysterectomy    . Total knee arthroplasty  02/23/2012    Procedure: TOTAL KNEE ARTHROPLASTY;  Surgeon: Johnny Bridge, MD;  Location: [place];  Service: Orthopedics;  Laterality: Left;  . Knee closed reduction  04/09/2012    Procedure: CLOSED MANIPULATION KNEE;  Surgeon: Johnny Bridge, MD;  Location: [place];  Service: Orthopedics;  Laterality: Left;  Manipulation Knee with Anesthesia includes Application of Traction   . Injection knee  04/09/2012    Procedure: KNEE INJECTION;  Surgeon: Johnny Bridge, MD;  Location: [place];  Service: Orthopedics;  Laterality: Left;    There were no vitals filed for this visit.  Visit Diagnosis:  Abnormality of gait  Pain of left lower extremity      Subjective Assessment - 12/26/14 0941    Subjective Pt presents to PT  with abnormal gait, tight L lumbar paraspinals/glute med due to gait abnormality, ITB tenderness, leg length difference which all affect mobility   Pertinent History L TKR with 2 manipulations 2013   Limitations Sitting;Standing;Walking;House hold activities   How long can you sit comfortably? 20 min   How long can you stand comfortably? 8 min   How long can you walk comfortably? 8 min   Diagnostic tests 09/2014 Xray of hip shows arthritis R > L   Patient Stated Goals get rid of pain   Currently in Pain? Yes   Pain Score 6    Pain Location Knee   Pain Orientation Left   Pain Descriptors / Indicators Constant   Pain Type Chronic pain   Pain Radiating Towards along L2-4 dermatome and S1 dermatome; glute med   Pain Onset More than a month ago   Pain Frequency Constant   Aggravating Factors  mobility   Pain Relieving Factors occasionally heat   Effect of Pain on Daily Activities can perform activities but with pain   Multiple Pain Sites No            [facility] PT Assessment - 12/26/14 0001    Assessment   Medical Diagnosis L hip pain   Onset Date/Surgical Date --  3 months ago   Precautions   Precautions --  L TKR with 2  manipulations 2013   Balance Screen   Has the patient fallen in the past 6 months No   Prior Function   Vocation Unemployed   Cognition   Overall Cognitive Status Impaired/Different from baseline   Area of Impairment Attention;Following commands;Awareness   Observation/Other Assessments   Observations HOH   Functional Tests   Functional tests Squat   Squat   Comments pt unable to flex L knee to perform efficiently and safely   ROM / Strength   AROM / PROM / Strength AROM;PROM;Strength   AROM   Overall AROM Comments L knee flex 35, ext -17. hip PROM WNL but with knee flexion restrictions   PROM   Overall PROM  --  L hip PROM WNL but with knee flex restrictions   Strength   Overall Strength Comments L LE strength 4+/5 all except L hip ext 4-/5, R 4+/5 all    Flexibility   Soft Tissue Assessment /Muscle Length --  L hip flex tight, L glute med, L lat L4-5 musculature, ITB   Palpation   SI assessment  symmetrical   Special Tests    Special Tests --  leg length: R 34.5 in and L 34   Ambulation/Gait   Assistive device --  quad cane   Gait Comments decreased L stance time, amb with L knee extended, decreased = WB, exaggerated hip hike present with gait L,decreased L hip ext   Balance   Balance Assessed Yes   Standardized Balance Assessment   Standardized Balance Assessment --  [name] 35/56                             PT Short Term Goals - 12/26/14 1002    PT SHORT TERM GOAL #1   Title Pt will be I with initial HEP to continue care at home for further pain reduction   Time 2   Period Weeks   Status New   PT SHORT TERM GOAL #2   Title Pt will report L hip pain to be 4/10 with transfers/gait for improvement in mobility   Time 2   Period Weeks   Status New   PT SHORT TERM GOAL #3   Title Pt will be able to amb x 20 min with 25% less difficulty for shopping   Time 2   Period Weeks   Status New           PT Long Term Goals - 12/26/14 1005    PT LONG TERM GOAL #1   Title Pt will report 75% reduction in L LE pain with functional mobility   Time 4   Period Weeks   Status New   PT LONG TERM GOAL #2   Title Pt will report decreased L LE radiation >/= 50% to assist with improved gait in community   Time 4   Period Weeks   Status New   PT LONG TERM GOAL #3   Title [name] will improve to 45/56 for decreased falls risk   Time 4   Period Weeks   Status New               Plan - 12/26/14 0957    Clinical Impression Statement Pt presents to PT with tight L glute med, hip flexors, ITB, and lateral L4-5 musculature due to abnormality of gait. Pt with strong Trendelenburg gait due to knee flexion restriction on L which also contributes to her pain. Poor balance also  apparent. Pt would benefit from therapy for  alleviating tight musculature and GT.   Pt will benefit from skilled therapeutic intervention in order to improve on the following deficits Abnormal gait;Decreased activity tolerance;Difficulty walking;Pain;Impaired flexibility   Rehab Potential Good   Clinical Impairments Affecting Rehab Potential L decreased knee ROM   PT Frequency 2x / week   PT Duration 4 weeks   PT Treatment/Interventions Iontophoresis 4mg /ml Dexamethasone;Moist Heat;Ultrasound;Cryotherapy;Gait training;Functional mobility training;Therapeutic activities;Therapeutic exercise;Balance training;Neuromuscular re-education;Patient/family education;Manual techniques;Taping;Dry needling   PT Next Visit Plan try heel lift on L, GT, flexibility exercises for L glute/lumbar paraspinals/ITB/hip flexors, issue HEP   PT Home Exercise Plan issue next visit   Consulted and Agree with Plan of Care Patient          G-Codes - 01/01/15 1009    Functional Assessment Tool Used [name] 35/56   Functional Limitation Mobility: Walking and moving around   Mobility: Walking and Moving Around Current Status [phone]) At least 20 percent but less than 40 percent impaired, limited or restricted   Mobility: Walking and Moving Around Goal Status [phone]) At least 1 percent but less than 20 percent impaired, limited or restricted   Mobility: Walking and Moving Around Discharge Status [phone]) At least 1 percent but less than 20 percent impaired, limited or restricted       Problem List Patient Active Problem List   Diagnosis Date Noted  . Left hip pain 10/08/2014  . Type 2 diabetes mellitus without complication 48/27/0786  . Hyperlipidemia LDL goal <100 10/08/2014  . Essential hypertension, benign 08/30/2014  . Diabetes mellitus, type 2 08/30/2014  . Diverticulosis of colon without hemorrhage 08/30/2014  . Chronic low back pain 08/30/2014  . Chronic pain syndrome 08/30/2014  . Asthma in adult without complication 75/44/9201  . Contracture of left  knee, arthrofibrosis post op TKA 04/09/2012  . Osteoarthritis of left knee 02/23/2012    Myra Rude, PT 01/01/15, 10:11 AM  [facility] [address] [place], [place], [address] Phone: [phone]   Fax:  [phone]

## 2015-01-03 ENCOUNTER — Other Ambulatory Visit: Payer: Medicare Other

## 2015-01-03 ENCOUNTER — Other Ambulatory Visit (HOSPITAL_COMMUNITY): Payer: Self-pay | Admitting: Nurse Practitioner

## 2015-01-03 DIAGNOSIS — I10 Essential (primary) hypertension: Secondary | ICD-10-CM

## 2015-01-03 DIAGNOSIS — E785 Hyperlipidemia, unspecified: Secondary | ICD-10-CM | POA: Diagnosis not present

## 2015-01-03 DIAGNOSIS — R202 Paresthesia of skin: Secondary | ICD-10-CM

## 2015-01-03 DIAGNOSIS — M2540 Effusion, unspecified joint: Secondary | ICD-10-CM | POA: Diagnosis not present

## 2015-01-03 DIAGNOSIS — R200 Anesthesia of skin: Secondary | ICD-10-CM

## 2015-01-03 DIAGNOSIS — E119 Type 2 diabetes mellitus without complications: Secondary | ICD-10-CM | POA: Diagnosis not present

## 2015-01-04 LAB — BASIC METABOLIC PANEL
BUN / CREAT RATIO: 11 (ref 11–26)
BUN: 8 mg/dL (ref 8–27)
CO2: 27 mmol/L (ref 18–29)
CREATININE: 0.74 mg/dL (ref 0.57–1.00)
Calcium: 9.5 mg/dL (ref 8.7–10.3)
Chloride: 101 mmol/L (ref 97–108)
GFR calc non Af Amer: 88 mL/min/{1.73_m2} (ref >59)
GFR, EST AFRICAN AMERICAN: 102 mL/min/{1.73_m2} (ref >59)
Glucose: 112 mg/dL — ABNORMAL HIGH (ref 65–99)
Potassium: 4.3 mmol/L (ref 3.5–5.2)
SODIUM: 143 mmol/L (ref 134–144)

## 2015-01-04 LAB — LIPID PANEL
CHOLESTEROL TOTAL: 130 mg/dL (ref 100–199)
Chol/HDL Ratio: 2.3 ratio units (ref 0.0–4.4)
HDL: 57 mg/dL (ref >39)
LDL CALC: 61 mg/dL (ref 0–99)
TRIGLYCERIDES: 61 mg/dL (ref 0–149)
VLDL Cholesterol Cal: 12 mg/dL (ref 5–40)

## 2015-01-04 LAB — HEMOGLOBIN A1C
Est. average glucose Bld gHb Est-mCnc: 143 mg/dL
HEMOGLOBIN A1C: 6.6 % — AB (ref 4.8–5.6)

## 2015-01-04 LAB — ALT: ALT: 20 IU/L (ref 0–32)

## 2015-01-05 ENCOUNTER — Encounter: Payer: Self-pay | Admitting: Internal Medicine

## 2015-01-05 ENCOUNTER — Ambulatory Visit (INDEPENDENT_AMBULATORY_CARE_PROVIDER_SITE_OTHER): Payer: Medicare Other | Admitting: Internal Medicine

## 2015-01-05 VITALS — BP 142/92 | HR 65 | Temp 98.1°F | Resp 20 | Ht 65.0 in | Wt 196.6 lb

## 2015-01-05 DIAGNOSIS — J45909 Unspecified asthma, uncomplicated: Secondary | ICD-10-CM | POA: Diagnosis not present

## 2015-01-05 DIAGNOSIS — E119 Type 2 diabetes mellitus without complications: Secondary | ICD-10-CM | POA: Diagnosis not present

## 2015-01-05 DIAGNOSIS — I10 Essential (primary) hypertension: Secondary | ICD-10-CM

## 2015-01-05 DIAGNOSIS — J301 Allergic rhinitis due to pollen: Secondary | ICD-10-CM

## 2015-01-05 DIAGNOSIS — M791 Myalgia, unspecified site: Secondary | ICD-10-CM

## 2015-01-05 DIAGNOSIS — G894 Chronic pain syndrome: Secondary | ICD-10-CM

## 2015-01-05 DIAGNOSIS — E785 Hyperlipidemia, unspecified: Secondary | ICD-10-CM

## 2015-01-05 MED ORDER — AMITIZA 24 MCG PO CAPS: 24.0000 ug | ORAL_CAPSULE | Freq: Two times a day (BID) | ORAL | Status: DC

## 2015-01-05 MED ORDER — ATORVASTATIN CALCIUM 40 MG PO TABS: 40.0000 mg | ORAL_TABLET | Freq: Every day | ORAL | Status: DC

## 2015-01-05 NOTE — Patient Instructions (Addendum)
Reduce lipitor to 40mg  daily - call if pain not improved.   Keep appt with pain clinic a scheduled in 2 weeks  Follow up with Ortho Dr Nicki Reaper as scheduled  Continue PT. Ice as needed to left knee  Will call with lab results   Follow up in 3 mos for routine visit. Fasting labs 2-3 days prior to appt

## 2015-01-05 NOTE — Progress Notes (Signed)
Patient ID: Tabitha Oneal, female   DOB: 05/14/1956, 59 y.o.   MRN: [id number]    Location:    [facility]   Place of Service:   OFFICE  Chief Complaint  Patient presents with  . Medical Management of Chronic Issues    3 month follow-up, copy  of labs printed     HPI:  59 yo female seen today for f/u. She c/o pain today  DM - she checks BS at home and usually 150-160s. She drinks plenty of water which keeps BS down. She takes metformin, tradjenta  HTN - BP improved on quinapril  Asthma/allergic rhinitis - no recent flares. She takes qvar and singulair and uses astelin spray. proventil HFA used prn and has not needed it in the last 2 mos.  Hyperlipidemia - takes lipitor 80mg . She has significant myalgias  Chronic back pain/OA - in lower back, hip and left knee. Pain worsens when she has slept on it all night. She is supposed to go to PT. MRI ordered to eval lumbar spine next week.  Pain in  knee worsens with prolonged standing. Her Ortho specialist is Dr Nicki Reaper. Pain regimen includes oxycodone, voltaren gel, and baclofen. Pain remains 8/10 on scale. No relief with ice packs. She [facility] and has appt in 2 weeks  Chronic constipation - stable on amitiza and needs Rx  Past Medical History  Diagnosis Date  . Diabetes mellitus   . Hypertension   . Asthma   . Osteoarthritis of left knee 02/23/2012  . 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 Diverticulitis   . High cholesterol   . Lower back pain   . Headache   . Chronic pain     Past Surgical History  Procedure Laterality Date  . Joint replacement      acl   bil knees  . Abdominal hysterectomy    . Total knee arthroplasty  02/23/2012    Procedure: TOTAL KNEE ARTHROPLASTY;  Surgeon: Johnny Bridge, MD;  Location: [place];  Service: Orthopedics;  Laterality: Left;  . Knee closed reduction  04/09/2012    Procedure: CLOSED MANIPULATION KNEE;  Surgeon: Johnny Bridge, MD;  Location: [place];  Service: Orthopedics;  Laterality: Left;  Manipulation Knee with Anesthesia includes Application of Traction   . Injection knee  04/09/2012    Procedure: KNEE INJECTION;  Surgeon: Johnny Bridge, MD;  Location: [place];  Service: Orthopedics;  Laterality: Left;    Patient Care Team: Gildardo Cranker, DO as PCP - General (Internal Medicine)  Social History   Social History  . Marital Status: Widowed    Spouse Name: N/A  . Number of Children: N/A  . Years of Education: N/A   Occupational History  . Not on file.   Social History Main Topics  . Smoking status: Never Smoker   . Smokeless tobacco: Never Used  . Alcohol Use: No  . Drug Use: No  . Sexual Activity: Not on file   Other Topics Concern  . Not on file   Social History Narrative   Diet: none   Do you drink/eat things with caffeine ? Coffee    Material status: widow     What year were you married? 08/01/1978   Do you live in a house, apartment, assisted living,condo, trailer,ect.)? Townhouse (temporary)    Is it one or more stories? Yes   How many persons live in your home?  Two   Do you have any pets in your home ? Yes, Shih-tzu   Current or past profession: none   Do you exercise? No  Type & how often: no   Do you have a living will ? No   Do you have a DNR form? No   If not, do you want to discuss one?  Not now   Do you have signed POA /HPOA forms? No    If so, please bring to your appointment.                                      reports that she has never smoked. She has never used smokeless tobacco. She reports that she does not drink alcohol or use illicit drugs.  No Known Allergies  Medications: Patient's Medications  New Prescriptions   No medications on file  Previous Medications   ALBUTEROL (PROVENTIL HFA;VENTOLIN HFA) 108 (90 BASE) MCG/ACT INHALER    Inhale 2 puffs into the lungs every 4 (four) hours as needed. For wheezing   AMITIZA 24 MCG CAPSULE     Take 24 mcg by mouth 2 (two) times daily with a meal.    ASPIRIN-ACETAMINOPHEN-CAFFEINE (EXCEDRIN MIGRAINE) 250-250-65 MG PER TABLET    Take 2 tablets by mouth daily.   ATORVASTATIN (LIPITOR) 80 MG TABLET    Take 1 tablet (80 mg total) by mouth daily.   AZELASTINE (ASTELIN) 137 MCG/SPRAY NASAL SPRAY    Place 1 spray into the nose 2 (two) times daily. Use in each nostril as directed   BACLOFEN (LIORESAL) 10 MG TABLET    Take 10 mg by mouth. 1 in morning 2 at bed time   BECLOMETHASONE (QVAR) 80 MCG/ACT INHALER    Inhale 2 puffs into the lungs 2 (two) times daily.   DICLOFENAC SODIUM (VOLTAREN) 1 % GEL    Apply 8 g topically 2 (two) times daily. On knees   LINAGLIPTIN (TRADJENTA) 5 MG TABS TABLET    1 by mouth daily   MELOXICAM (MOBIC) 15 MG TABLET    Take 15 mg by mouth daily.   METFORMIN (GLUCOPHAGE) 500 MG TABLET    Take 500 mg by mouth. 1 tablet 2 times daily   MONTELUKAST (SINGULAIR) 10 MG TABLET    Take 1 tablet (10 mg total) by mouth at bedtime.   ONE TOUCH ULTRA TEST TEST STRIP    Check blood sugar once daily as directed   OXYCODONE HCL 10 MG TABS    Take 10 mg by mouth 3 (three) times daily.   QUINAPRIL (ACCUPRIL) 20 MG TABLET    Take 1 tablet (20 mg total) by mouth at bedtime.  Modified Medications   No medications on file  Discontinued Medications   No medications on file    Review of Systems  Constitutional: Positive for fatigue. Negative for fever, chills, diaphoresis, activity change and appetite change.  HENT: Negative for ear pain and sore throat.   Eyes: Negative for visual disturbance.  Respiratory: Negative for cough, chest tightness and shortness of breath.   Cardiovascular: Negative for chest pain, palpitations and leg swelling.  Gastrointestinal: Negative for nausea, vomiting, abdominal pain, diarrhea, constipation and blood in stool.  Genitourinary: Negative for dysuria.  Musculoskeletal: Positive for myalgias, arthralgias and gait problem.  Neurological: Negative for  dizziness, tremors, numbness and headaches.  Psychiatric/Behavioral: Negative for sleep disturbance. The patient is not nervous/anxious.  Filed Vitals:   01/05/15 1028  BP: 142/92  Pulse: 65  Temp: 98.1 F (36.7 C)  TempSrc: Oral  Resp: 20  Height: 5\' 5"  (1.651 m)  Weight: 196 lb 9.6 oz (89.177 kg)  SpO2: 98%   Body mass index is 32.72 kg/(m^2).  Physical Exam  Constitutional: She is oriented to person, place, and time. She appears well-developed and well-nourished.  Looks uncomfortable in NAD  HENT:  Mouth/Throat: Oropharynx is clear and moist. No oropharyngeal exudate.  Eyes: Pupils are equal, round, and reactive to light. No scleral icterus.  Neck: Neck supple. Carotid bruit is not present. No tracheal deviation present. No thyromegaly present.  Cardiovascular: Normal rate, regular rhythm, normal heart sounds and intact distal pulses.  Exam reveals no gallop and no friction rub.   No murmur heard. No LE edema b/l. no calf TTP.   Pulmonary/Chest: Effort normal and breath sounds normal. No stridor. No respiratory distress. She has no wheezes. She has no rales.  Abdominal: Soft. Bowel sounds are normal. She exhibits no distension and no mass. There is no hepatomegaly. There is no tenderness. There is no rebound and no guarding.  Musculoskeletal: She exhibits edema and tenderness.  L>R knee swelling with increased warmth to touch. Left knee immobilizer intact; uses cane to ambulate; gait antalgic  Lymphadenopathy:    She has no cervical adenopathy.  Neurological: She is alert and oriented to person, place, and time. She has normal reflexes.  Skin: Skin is warm and dry. No rash noted.  Psychiatric: She has a normal mood and affect. Her behavior is normal. Judgment and thought content normal. Her speech is slurred.     Labs reviewed: Appointment on 01/03/2015  Component Date Value Ref Range Status  . Glucose 01/03/2015 112* 65 - 99 mg/dL Final  . BUN 01/03/2015 8  8 - 27  mg/dL Final  . Creatinine, Ser 01/03/2015 0.74  0.57 - 1.00 mg/dL Final  . GFR calc non Af Amer 01/03/2015 88  >59 mL/min/1.73 Final  . GFR calc Af Amer 01/03/2015 102  >59 mL/min/1.73 Final  . BUN/Creatinine Ratio 01/03/2015 11  11 - 26 Final  . Sodium 01/03/2015 143  134 - 144 mmol/L Final  . Potassium 01/03/2015 4.3  3.5 - 5.2 mmol/L Final  . Chloride 01/03/2015 101  97 - 108 mmol/L Final  . CO2 01/03/2015 27  18 - 29 mmol/L Final  . Calcium 01/03/2015 9.5  8.7 - 10.3 mg/dL Final  . ALT 01/03/2015 20  0 - 32 IU/L Final  . Hgb A1c MFr Bld 01/03/2015 6.6* 4.8 - 5.6 % Final   Comment:          Pre-diabetes: 5.7 - 6.4          Diabetes: >6.4          Glycemic control for adults with diabetes: <7.0   . Est. average glucose Bld gHb Est-m* 01/03/2015 143   Final  . Cholesterol, Total 01/03/2015 130  100 - 199 mg/dL Final  . Triglycerides 01/03/2015 61  0 - 149 mg/dL Final  . HDL 01/03/2015 57  >39 mg/dL Final   Comment: According to ATP-III Guidelines, HDL-C >59 mg/dL is considered a negative risk factor for CHD.   Marland Kitchen VLDL Cholesterol Cal 01/03/2015 12  5 - 40 mg/dL Final  . LDL Calculated 01/03/2015 61  0 - 99 mg/dL Final  . Chol/HDL Ratio 01/03/2015 2.3  0.0 - 4.4 ratio units Final   Comment:  T. Chol/HDL Ratio                                             Men  Women                               1/2 Avg.Risk  3.4    3.3                                   Avg.Risk  5.0    4.4                                2X Avg.Risk  9.6    7.1                                3X Avg.Risk 23.4   11.0   Office Visit on 10/06/2014  Component Date Value Ref Range Status  . DIAGNOSIS: 10/06/2014 Comment   Final   NEGATIVE FOR INTRAEPITHELIAL LESION AND MALIGNANCY.  Marland Kitchen Specimen adequacy: 10/06/2014 Comment   Final   Satisfactory for evaluation.  Marland Kitchen CLINICIAN PROVIDED ICD10: 10/06/2014 Comment   Final   Z12.4  . Performed by: 10/06/2014 Comment   Final   Micheline Rough,  Cytotechnologist (ASCP)  . PAP SMEAR COMMENT 10/06/2014 .   Final  . Note: 10/06/2014 Comment   Final   Comment: The Pap smear is a screening test designed to aid in the detection of premalignant and malignant conditions of the uterine cervix.  It is not a diagnostic procedure and should not be used as the sole means of detecting cervical cancer.  Both false-positive and false-negative reports do occur.   . Test Methodology 10/06/2014 Comment   Final   Comment: This liquid based ThinPrep(R) pap test was screened with the use of an image guided system.     No results found.   Assessment/Plan   ICD-9-CM ICD-10-CM   1. Myalgia - possibly statin induced 729.1 M79.1   2. Essential hypertension, benign - stable 401.1 I10   3. Type 2 diabetes mellitus without complication - controlled 250.00 E11.9   4. Hyperlipidemia LDL goal <100 - stable 272.4 E78.5   5. Allergic rhinitis due to pollen - stable 477.0 J30.1   6. Asthma in adult without complication - controlled 493.90 J45.909   7. Chronic pain syndrome due to arthritis - uncontrolled 338.4 G89.4     --reduce lipitor to 40 mg qhs  --eRx sent for Amitiza  --Keep appt with pain clinic a scheduled in 2 weeks  --Follow up with Ortho Dr Nicki Reaper as scheduled  --Continue PT. Ice as needed to left knee  --Will call with lab results   --Follow up in 3 mos for routine visit. Fasting labs 2-3 days prior to appt   [place] S. Perlie Gold  [facility] [address] [place], [place] [address] [phone] Cell (Monday-Friday 8 AM - 5 PM) [phone] After 5 PM and follow prompts

## 2015-01-06 LAB — URIC ACID: URIC ACID: 4 mg/dL (ref 2.5–7.1)

## 2015-01-06 LAB — SPECIMEN STATUS REPORT

## 2015-01-10 ENCOUNTER — Ambulatory Visit (HOSPITAL_COMMUNITY)
Admission: RE | Admit: 2015-01-10 | Discharge: 2015-01-10 | Disposition: A | Discharge status: Home / Self Care | Payer: Medicare Other | Source: Ambulatory Visit | Attending: Nurse Practitioner | Admitting: Nurse Practitioner

## 2015-01-10 DIAGNOSIS — M4806 Spinal stenosis, lumbar region: Secondary | ICD-10-CM | POA: Diagnosis not present

## 2015-01-10 DIAGNOSIS — M5136 Other intervertebral disc degeneration, lumbar region: Secondary | ICD-10-CM | POA: Insufficient documentation

## 2015-01-10 DIAGNOSIS — R200 Anesthesia of skin: Secondary | ICD-10-CM

## 2015-01-10 DIAGNOSIS — M9973 Connective tissue and disc stenosis of intervertebral foramina of lumbar region: Secondary | ICD-10-CM | POA: Diagnosis not present

## 2015-01-10 DIAGNOSIS — M47896 Other spondylosis, lumbar region: Secondary | ICD-10-CM | POA: Diagnosis not present

## 2015-01-10 DIAGNOSIS — M545 Low back pain: Secondary | ICD-10-CM | POA: Diagnosis present

## 2015-01-10 DIAGNOSIS — M5126 Other intervertebral disc displacement, lumbar region: Secondary | ICD-10-CM | POA: Diagnosis not present

## 2015-01-10 DIAGNOSIS — R202 Paresthesia of skin: Secondary | ICD-10-CM

## 2015-01-10 DIAGNOSIS — M47816 Spondylosis without myelopathy or radiculopathy, lumbar region: Secondary | ICD-10-CM | POA: Diagnosis not present

## 2015-01-15 ENCOUNTER — Ambulatory Visit: Payer: Medicare Other | Admitting: Physical Therapy

## 2015-01-15 DIAGNOSIS — M79605 Pain in left leg: Secondary | ICD-10-CM

## 2015-01-15 DIAGNOSIS — R269 Unspecified abnormalities of gait and mobility: Secondary | ICD-10-CM | POA: Diagnosis not present

## 2015-01-15 NOTE — Patient Instructions (Signed)
   Brylea Pita PT, DPT, LAT, ATC  [facility] Phone: [phone]     

## 2015-01-15 NOTE — Therapy (Signed)
[place], [place], [address] Phone: [phone]   Fax:  [phone]  Physical Therapy Treatment  Patient Details  Name: Tabitha Oneal MRN: [id number] Date of Birth: 08/12/1955 Referring Provider:  Gildardo Cranker, DO  Encounter Date: 01/15/2015      PT End of Session - 01/15/15 1027    Visit Number 2   Number of Visits 8   Date for PT Re-Evaluation 01/23/15   PT Start Time 0930   PT Stop Time 1015   PT Time Calculation (min) 45 min   Activity Tolerance Patient limited by pain   Behavior During Therapy [facility] for tasks assessed/performed      Past Medical History  Diagnosis Date  . Diabetes mellitus   . Hypertension   . Asthma   . Osteoarthritis of left knee 02/23/2012  . 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 Diverticulitis   . High cholesterol   . Lower back pain   . Headache   . Chronic pain     Past Surgical History  Procedure Laterality Date  . Joint replacement      acl   bil knees  . Abdominal hysterectomy    . Total knee arthroplasty  02/23/2012    Procedure: TOTAL KNEE ARTHROPLASTY;  Surgeon: Johnny Bridge, MD;  Location: [place];  Service: Orthopedics;  Laterality: Left;  . Knee closed reduction  04/09/2012    Procedure: CLOSED MANIPULATION KNEE;  Surgeon: Johnny Bridge, MD;  Location: [place];  Service: Orthopedics;  Laterality: Left;  Manipulation Knee with Anesthesia includes Application of Traction   . Injection knee  04/09/2012    Procedure: KNEE INJECTION;  Surgeon: Johnny Bridge, MD;  Location: [place];  Service: Orthopedics;  Laterality: Left;    There were no vitals filed for this visit.  Visit Diagnosis:  Abnormality of gait  Pain of left lower extremity      Subjective Assessment - 01/15/15 0941    Subjective "everything is going the same since being seen for evaluation"    Currently in Pain?  Yes   Pain Score 8    Pain Location Knee   Pain Orientation Left   Pain Descriptors / Indicators Aching   Pain Type Chronic pain   Pain Onset More than a month ago   Pain Frequency Constant   Aggravating Factors  standing,    Pain Relieving Factors resting   Multiple Pain Sites Yes   Pain Score 7   Pain Location Back   Pain Orientation Left   Pain Descriptors / Indicators Aching   Pain Type Chronic pain   Pain Onset More than a month ago   Pain Frequency Constant   Aggravating Factors  laying down   Pain Relieving Factors sitting up                         [facility] Adult PT Treatment/Exercise - 01/15/15 0946    Self-Care   Self-Care Posture;Other Self-Care Comments   Posture walking posture and how to properly utilize [facility] on the R to help with stability on the L.   after education pt demonstrated and walked easier   Other Self-Care Comments  education regarding heel lift and benefits   Knee/Hip Exercises: Stretches   Passive Hamstring Stretch 2 reps;30 seconds   Quad Stretch Left;2 reps;30 seconds   ITB Stretch 2 reps;30 seconds  Manual Therapy   Manual Therapy Soft tissue mobilization;Myofascial release;Joint mobilization   Manual therapy comments grade 3 A>P l knee mobilization to increase AROM.   Soft tissue mobilization trigger point release of L glute med/min x 3 spots                PT Education - 01/15/15 1026    Education provided Yes   Education Details HEP, educating regarding heel lift and beneftis, educated regarding proper amb with SBQC   Person(s) Educated Patient   Methods Explanation   Comprehension Verbalized understanding          PT Short Term Goals - 01/15/15 1303    PT SHORT TERM GOAL #1   Title Pt will be I with initial HEP to continue care at home for further pain reduction   Time 2   Period Weeks   Status On-going   PT SHORT TERM GOAL #2   Title Pt will report L hip pain to be 4/10 with transfers/gait for  improvement in mobility   Time 2   Period Weeks   Status On-going   PT SHORT TERM GOAL #3   Title Pt will be able to amb x 20 min with 25% less difficulty for shopping   Time 2   Period Weeks   Status On-going           PT Long Term Goals - 01/15/15 1303    PT LONG TERM GOAL #1   Title Pt will report 75% reduction in L LE pain with functional mobility   Time 4   Period Weeks   Status On-going   PT LONG TERM GOAL #2   Title Pt will report decreased L LE radiation >/= 50% to assist with improved gait in community   Time 4   Period Weeks   Status On-going   PT LONG TERM GOAL #3   Title Tabitha Oneal will improve to 45/56 for decreased falls risk   Time 4   Period Weeks   Status On-going               Plan - 01/15/15 1028    Clinical Impression Statement Tabitha Oneal present to therapy today with continued tightness in the LLE with increased tendnerness throughout the LLE with increased sorenees upon palpation at the L posterior hip musculature.  gave pt a heel lift and educated about heel lift and  to note how things go next visit. She continues to dmeonstrate limited L knee AROM with limited flexion. performed Stretching and STM and trigger point release of the L glutes which pt exhibit increased anxiety of pain, requiring VC that eveyrhting was ok and to focus on breathing techniques.   PT Next Visit Plan assess benefit of heel lift,  Gait Training, flexibility exercises for L glute/lumbar paraspinals/ITB/hip flexors, issue HEP   PT Home Exercise Plan see HEP handout, education regard heel lift   Consulted and Agree with Plan of Care Patient        Problem List Patient Active Problem List   Diagnosis Date Noted  . Left hip pain 10/08/2014  . Type 2 diabetes mellitus without complication 63/05/6008  . Hyperlipidemia LDL goal <100 10/08/2014  . Essential hypertension, benign 08/30/2014  . Diabetes mellitus, type 2 08/30/2014  . Diverticulosis of colon without hemorrhage  08/30/2014  . Chronic low back pain 08/30/2014  . Chronic pain syndrome 08/30/2014  . Asthma in adult without complication 93/23/5573  . Contracture of left knee, arthrofibrosis post op  TKA 04/09/2012  . Osteoarthritis of left knee 02/23/2012   Starr Lake PT, DPT, LAT, ATC  01/15/2015  1:11 PM     [facility] [address] [place], [place], [address] Phone: [phone]   Fax:  [phone]

## 2015-01-17 ENCOUNTER — Ambulatory Visit: Payer: Medicare Other | Admitting: Physical Therapy

## 2015-01-17 DIAGNOSIS — M79605 Pain in left leg: Secondary | ICD-10-CM

## 2015-01-17 DIAGNOSIS — M25552 Pain in left hip: Secondary | ICD-10-CM | POA: Diagnosis not present

## 2015-01-17 DIAGNOSIS — R269 Unspecified abnormalities of gait and mobility: Secondary | ICD-10-CM | POA: Diagnosis not present

## 2015-01-17 DIAGNOSIS — Z79891 Long term (current) use of opiate analgesic: Secondary | ICD-10-CM | POA: Diagnosis not present

## 2015-01-17 DIAGNOSIS — G890 Central pain syndrome: Secondary | ICD-10-CM | POA: Diagnosis not present

## 2015-01-17 DIAGNOSIS — M545 Low back pain: Secondary | ICD-10-CM | POA: Diagnosis not present

## 2015-01-17 DIAGNOSIS — M25562 Pain in left knee: Secondary | ICD-10-CM | POA: Diagnosis not present

## 2015-01-17 NOTE — Therapy (Addendum)
[place], [place], [address] Phone: [phone]   Fax:  [phone]  Physical Therapy Treatment  Patient Details  Name: Tabitha Oneal MRN: [id number] Date of Birth: [birth year] Referring Provider:  Gildardo Cranker, DO  Encounter Date: 01/17/2015      PT End of Session - 01/17/15 1047    Visit Number 3   Number of Visits 8   Date for PT Re-Evaluation 01/23/15   PT Start Time 0940   PT Stop Time 1015   PT Time Calculation (min) 35 min   Activity Tolerance Patient tolerated treatment well   Behavior During Therapy [facility] for tasks assessed/performed      Past Medical History  Diagnosis Date  . Diabetes mellitus   . Hypertension   . Asthma   . Osteoarthritis of left knee 02/23/2012  . 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 Diverticulitis   . High cholesterol   . Lower back pain   . Headache   . Chronic pain     Past Surgical History  Procedure Laterality Date  . Joint replacement      acl   bil knees  . Abdominal hysterectomy    . Total knee arthroplasty  02/23/2012    Procedure: TOTAL KNEE ARTHROPLASTY;  Surgeon: Johnny Bridge, MD;  Location: [place];  Service: Orthopedics;  Laterality: Left;  . Knee closed reduction  04/09/2012    Procedure: CLOSED MANIPULATION KNEE;  Surgeon: Johnny Bridge, MD;  Location: [place];  Service: Orthopedics;  Laterality: Left;  Manipulation Knee with Anesthesia includes Application of Traction   . Injection knee  04/09/2012    Procedure: KNEE INJECTION;  Surgeon: Johnny Bridge, MD;  Location: [place];  Service: Orthopedics;  Laterality: Left;    There were no vitals filed for this visit.  Visit Diagnosis:  Abnormality of gait  Pain of left lower extremity      Subjective Assessment - 01/17/15 0943    Subjective Pt states she is having some pain but when at rest she is fine it is  happening only with movement. Pt. fell this morning , she tripped over something catching herself with her hand, she did not hit her knee.   Currently in Pain? Yes   Pain Score 8    Pain Location Knee  back and hip on same side   Pain Orientation Left   Pain Descriptors / Indicators Throbbing   Pain Score 8   Pain Location Back   Pain Orientation Left                         [facility] Adult PT Treatment/Exercise - 01/17/15 1001    Self-Care   Self-Care Other Self-Care Comments   Other Self-Care Comments  trying to rid of thow rugs in the home to dercrease fall risk   Knee/Hip Exercises: Stretches   Active Hamstring Stretch Both;2 reps;30 seconds   Hip Flexor Stretch Both;1 rep;60 seconds   ITB Stretch Both;2 reps;60 seconds   Gastroc Stretch Both;2 reps;30 seconds   Knee/Hip Exercises: Supine   Knee Flexion AAROM;Left;2 sets;Other (comment)   Knee Flexion Limitations regaining ROM with sheet   Manual Therapy   Manual Therapy Joint mobilization;Other (comment)   Joint Mobilization grade 3, at tibia, A-P   Soft tissue mobilization gentle massage over clothes in sidelying  PT Education - 01/17/15 1044    Education provided Yes   Education Details Trying to get rid of some of the rugs in her home (fall risk)   Person(s) Educated Patient   Methods Explanation   Comprehension Verbalized understanding          PT Short Term Goals - 01/15/15 1303    PT SHORT TERM GOAL #1   Title Pt will be I with initial HEP to continue care at home for further pain reduction   Time 2   Period Weeks   Status On-going   PT SHORT TERM GOAL #2   Title Pt will report L hip pain to be 4/10 with transfers/gait for improvement in mobility   Time 2   Period Weeks   Status On-going   PT SHORT TERM GOAL #3   Title Pt will be able to amb x 20 min with 25% less difficulty for shopping   Time 2   Period Weeks   Status On-going           PT Long Term Goals  - 01/15/15 1303    PT LONG TERM GOAL #1   Title Pt will report 75% reduction in L LE pain with functional mobility   Time 4   Period Weeks   Status On-going   PT LONG TERM GOAL #2   Title Pt will report decreased L LE radiation >/= 50% to assist with improved gait in community   Time 4   Period Weeks   Status On-going   PT LONG TERM GOAL #3   Title [name] will improve to 45/56 for decreased falls risk   Time 4   Period Weeks   Status On-going               Plan - 01/17/15 1048    Clinical Impression Statement Pt. presents with continued tightness is L LE. Pt states she is wearing the heel lift she was given because she was told to, its a little uncomfortable but she did not have much of an opinion about it. Pt was still very sensitive and tender to palpation in L. superior  glute muscle. Pt. is very restricted into knee flexion .   PT Next Visit Plan rock tool to glute if able to to telerate and hamstring tendons, deep stretching to L LE hip flexor/ITB   Consulted and Agree with Plan of Care Patient      BERG  Problem List Patient Active Problem List   Diagnosis Date Noted  . Left hip pain 10/08/2014  . Type 2 diabetes mellitus without complication 33/82/5053  . Hyperlipidemia LDL goal <100 10/08/2014  . Essential hypertension, benign 08/30/2014  . Diabetes mellitus, type 2 08/30/2014  . Diverticulosis of colon without hemorrhage 08/30/2014  . Chronic low back pain 08/30/2014  . Chronic pain syndrome 08/30/2014  . Asthma in adult without complication 97/67/3419  . 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 Osteoarthritis of left knee 02/23/2012    PAA,JENNIFER 01/18/2015, 12:45 PM  [facility] [address] [place], [place], [address] Phone: [phone]   Fax:  [phone]   Raeford Razor, PT 01/18/2015 12:45 PM Phone: [phone] Fax: [phone]

## 2015-01-22 ENCOUNTER — Ambulatory Visit: Payer: Medicare Other | Admitting: Physical Therapy

## 2015-01-22 DIAGNOSIS — M79605 Pain in left leg: Secondary | ICD-10-CM

## 2015-01-22 DIAGNOSIS — R269 Unspecified abnormalities of gait and mobility: Secondary | ICD-10-CM | POA: Diagnosis not present

## 2015-01-22 NOTE — Therapy (Addendum)
[place], [place], [address] Phone: [phone]   Fax:  [phone]  Physical Therapy Treatment/ Renewal  Patient Details  Name: Tabitha Oneal MRN: [id number] Date of Birth: [birth year] Referring Provider:  Gildardo Cranker, DO  Encounter Date: 01/22/2015      PT End of Session - 01/22/15 1029    Visit Number 5   Number of Visits 17   Date for PT Re-Evaluation 03/05/15   PT Start Time 0935   PT Stop Time 1020   PT Time Calculation (min) 45 min   Activity Tolerance Patient tolerated treatment well   Behavior During Therapy [facility] for tasks assessed/performed      Past Medical History  Diagnosis Date  . Diabetes mellitus   . Hypertension   . Asthma   . Osteoarthritis of left knee 02/23/2012  . 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 Diverticulitis   . High cholesterol   . Lower back pain   . Headache   . Chronic pain     Past Surgical History  Procedure Laterality Date  . Joint replacement      acl   bil knees  . Abdominal hysterectomy    . Total knee arthroplasty  02/23/2012    Procedure: TOTAL KNEE ARTHROPLASTY;  Surgeon: Johnny Bridge, MD;  Location: [place];  Service: Orthopedics;  Laterality: Left;  . Knee closed reduction  04/09/2012    Procedure: CLOSED MANIPULATION KNEE;  Surgeon: Johnny Bridge, MD;  Location: [place];  Service: Orthopedics;  Laterality: Left;  Manipulation Knee with Anesthesia includes Application of Traction   . Injection knee  04/09/2012    Procedure: KNEE INJECTION;  Surgeon: Johnny Bridge, MD;  Location: [place];  Service: Orthopedics;  Laterality: Left;    There were no vitals filed for this visit.  Visit Diagnosis:  Abnormality of gait  Pain of left lower extremity      Subjective Assessment - 01/22/15 0934    Subjective Pt is having increased pain when walking and minimal pain when  sitting. Pt. states the pain is waking her up at night, coming from her back and knee. She wears a brace on her left knee given by her doctor. She states she wears it because it makes her feel more stable.   Currently in Pain? Yes   Pain Score 8   rest 8/10   Pain Location Knee   Pain Orientation Left   Pain Score 6   Pain Location Back   Pain Orientation Left            [facility] PT Assessment - 01/22/15 0943    AROM   Overall AROM Comments Lknee flex 40, extension             [facility] Adult PT Treatment/Exercise - 01/22/15 0951    Self-Care   Self-Care Other Self-Care Comments   Other Self-Care Comments  education about core stabilization   Knee/Hip Exercises: Supine   Quad Sets AROM;Strengthening;Left;1 set;10 reps   Short Arc Quad Sets AROM;Left;1 set;10 reps   Knee Flexion AAROM;Left;10 reps;Other (comment)   Knee Flexion Limitations 5 second hold   Manual Therapy   Manual Therapy Joint mobilization   Manual therapy comments grade 3 too painful today   Joint Mobilization grade 2      Increased time today for self care and accurate reporting of symptoms, pt. Needs repetition of concepts and is  slow to process.        PT Education - February 15, 2015 1028    Education provided Yes   Education Details HEP, POC/PT,    Person(s) Educated Patient   Methods Explanation;Handout   Comprehension Verbalized understanding;Returned demonstration          PT Short Term Goals - 2015/02/15 1013    PT SHORT TERM GOAL #1   Title Pt will be I with initial HEP to continue care at home for further pain reduction   Baseline added knee ROM today   Time 2   Period Weeks   Status Partially Met   PT SHORT TERM GOAL #2   Title Pt will report L hip pain to be 4/10 with transfers/gait for improvement in mobility   Baseline 6/10   Status On-going   PT SHORT TERM GOAL #3   Title Pt will be able to amb x 20 min with 25% less difficulty for shopping   Time 2   Period Weeks   Status On-going            PT Long Term Goals - 2015/02/15 1015    PT LONG TERM GOAL #1   Title Pt will report 75% reduction in L LE pain with functional mobility   Baseline no relief   Time 4   Period Weeks   Status On-going   PT LONG TERM GOAL #2   Title Pt will report decreased L LE radiation >/= 50% to assist with improved gait in community   Time 4   Period Weeks   Status On-going   PT LONG TERM GOAL #3   Title [name] will improve to 45/56 for decreased falls risk   Time 4   Period Weeks   Status Unable to assess            G-Codes - 02-15-15 27-Jul-1235    Functional Assessment Tool Used clinical judgement   Functional Limitation Mobility: Walking and moving around   Mobility: Walking and Moving Around Current Status [phone]) At least 60 percent but less than 80 percent impaired, limited or restricted   Mobility: Walking and Moving Around Goal Status [phone]) At least 40 percent but less than 60 percent impaired, limited or restricted             Plan - 02-15-15 1029    Clinical Impression Statement Pt. is has made 5 degrees of improvement in knee flexion, no change in knee extension, still measuring -17 in supine. Pt. strength was not assessed today due to increased hip pain during therapy session. Pt. reports no change in pain levels and concerned if she will ever be pain free. Pt is also limited by her hip and back pain. Pt would benefit from skilled PT services in order to to meet all goals, work on pain management strategies, and normalize gait. She must show functionall gains in order to continue beyond 4 more weeks   PT Next Visit Plan standing hip strength, possibly trying taping for knee pain, HEP, MANUAL   PT Home Exercise Plan short arc quads, quad sets with towel roll under ankle   Consulted and Agree with Plan of Care Patient          G-Codes - February 15, 2015 July 27, 1235    Functional Assessment Tool Used clinical judgement   Functional Limitation Mobility: Walking and moving around    Mobility: Walking and Moving Around Current Status ([id number]) At least 60 percent but less than 80 percent impaired, limited or restricted  Mobility: Walking and Moving Around Goal Status [phone]) At least 40 percent but less than 60 percent impaired, limited or restricted      Problem List Patient Active Problem List   Diagnosis Date Noted  . Left hip pain 10/08/2014  . Type 2 diabetes mellitus without complication 32/99/2426  . Hyperlipidemia LDL goal <100 10/08/2014  . Essential hypertension, benign 08/30/2014  . Diabetes mellitus, type 2 08/30/2014  . Diverticulosis of colon without hemorrhage 08/30/2014  . Chronic low back pain 08/30/2014  . Chronic pain syndrome 08/30/2014  . Asthma in adult without complication 83/41/9622  . 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 Osteoarthritis of left knee 02/23/2012  Radonna Ricker, SPT  PAA,JENNIFER 01/22/2015, 12:41 PM  [facility] [address] [place], [place], [address] Phone: [phone]   Fax:  [phone]  Raeford Razor, PT 01/22/2015 12:41 PM Phone: [phone] Fax: [phone]

## 2015-01-24 ENCOUNTER — Ambulatory Visit: Payer: Medicare Other | Admitting: Physical Therapy

## 2015-01-24 DIAGNOSIS — R269 Unspecified abnormalities of gait and mobility: Secondary | ICD-10-CM | POA: Diagnosis not present

## 2015-01-24 DIAGNOSIS — M79605 Pain in left leg: Secondary | ICD-10-CM

## 2015-01-24 NOTE — Therapy (Signed)
[place], [place], [address] Phone: [phone]   Fax:  [phone]  Physical Therapy Treatment  Patient Details  Name: Tabitha Oneal MRN: [id number] Date of Birth: 07-Dec-1955 Referring Provider:  Gildardo Cranker, DO  Encounter Date: 01/24/2015      PT End of Session - 01/24/15 0940    Visit Number 5   Number of Visits 17   Date for PT Re-Evaluation 03/05/15   PT Start Time 0930   PT Stop Time 1020   PT Time Calculation (min) 50 min   Activity Tolerance Patient tolerated treatment well   Behavior During Therapy [facility] for tasks assessed/performed      Past Medical History  Diagnosis Date  . Diabetes mellitus   . Hypertension   . Asthma   . Osteoarthritis of left knee 02/23/2012  . 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 Diverticulitis   . High cholesterol   . Lower back pain   . Headache   . Chronic pain     Past Surgical History  Procedure Laterality Date  . Joint replacement      acl   bil knees  . Abdominal hysterectomy    . Total knee arthroplasty  02/23/2012    Procedure: TOTAL KNEE ARTHROPLASTY;  Surgeon: Johnny Bridge, MD;  Location: [place];  Service: Orthopedics;  Laterality: Left;  . Knee closed reduction  04/09/2012    Procedure: CLOSED MANIPULATION KNEE;  Surgeon: Johnny Bridge, MD;  Location: [place];  Service: Orthopedics;  Laterality: Left;  Manipulation Knee with Anesthesia includes Application of Traction   . Injection knee  04/09/2012    Procedure: KNEE INJECTION;  Surgeon: Johnny Bridge, MD;  Location: [place];  Service: Orthopedics;  Laterality: Left;    There were no vitals filed for this visit.  Visit Diagnosis:  Abnormality of gait  Pain of left lower extremity      Subjective Assessment - 01/24/15 0937    Subjective "I've been doing my exercises and I've been sore from them by they seem to  help some"   Currently in Pain? Yes   Pain Score 6   walking 8/10   Pain Location Knee   Pain Orientation Left   Pain Descriptors / Indicators Throbbing   Pain Type Chronic pain   Pain Onset More than a month ago   Pain Frequency Constant   Aggravating Factors  standing, walking   Pain Relieving Factors Resting/sitting   Pain Score 7  sitting down 5/10   Pain Location Back   Pain Orientation Left;Right;Lower   Pain Descriptors / Indicators Aching   Pain Onset More than a month ago   Pain Frequency Constant   Aggravating Factors  standing, and walking,    Pain Relieving Factors sitting down            [facility] PT Assessment - 01/24/15 0001    ROM / Strength   AROM / PROM / Strength AROM   AROM   AROM Assessment Site Knee   Right/Left Knee Left   Left Knee Extension -15   Left Knee Flexion 41                     [facility] Adult PT Treatment/Exercise - 01/24/15 0940    Self-Care   Other Self-Care Comments  educated about benefits of elevating the knee above the level of her heart, to promote  reduction of swelling.    Knee/Hip Exercises: Stretches   Active Hamstring Stretch Both;2 reps;30 seconds   Hip Flexor Stretch 2 reps;30 seconds;Left   Gastroc Stretch Both;2 reps;30 seconds   Knee/Hip Exercises: Supine   Quad Sets AROM;Strengthening;Left;1 set;15 reps;Both   Quad Sets Limitations peforming bil to assist with L quad muscle activation   Short Arc Quad Sets AROM;Left;1 set;15 reps   Short Arc Quad Sets Limitations peforming bil to assist with L quad muscle activation   Straight Leg Raises AAROM;Left;1 set;10 reps  VC for controlled lowering   Knee Flexion AAROM;Left;10 reps;Other (comment)   Modalities   Modalities Cryotherapy   Cryotherapy   Number Minutes Cryotherapy 10 Minutes   Cryotherapy Location Knee   Type of Cryotherapy Ice pack  in supine with knee elevated on bolster   Manual Therapy   Manual Therapy Joint mobilization   Joint Mobilization  Grade 2 L knee P<>A mobilizations   Soft tissue mobilization Retrograde/ anterograde massage over L knee to assist with edema reduction.                 PT Education - 01/24/15 1144    Education provided Yes   Education Details benefits of RICE to control swelling   Person(s) Educated Patient   Methods Explanation   Comprehension Verbalized understanding          PT Short Term Goals - 01/22/15 1013    PT SHORT TERM GOAL #1   Title Pt will be I with initial HEP to continue care at home for further pain reduction   Baseline added knee ROM today   Time 2   Period Weeks   Status Partially Met   PT SHORT TERM GOAL #2   Title Pt will report L hip pain to be 4/10 with transfers/gait for improvement in mobility   Baseline 6/10   Status On-going   PT SHORT TERM GOAL #3   Title Pt will be able to amb x 20 min with 25% less difficulty for shopping   Time 2   Period Weeks   Status On-going           PT Long Term Goals - 01/22/15 1015    PT LONG TERM GOAL #1   Title Pt will report 75% reduction in L LE pain with functional mobility   Baseline no relief   Time 4   Period Weeks   Status On-going   PT LONG TERM GOAL #2   Title Pt will report decreased L LE radiation >/= 50% to assist with improved gait in community   Time 4   Period Weeks   Status On-going   PT LONG TERM GOAL #3   Title [name] will improve to 45/56 for decreased falls risk   Time 4   Period Weeks   Status Unable to assess               Plan - 01/24/15 1206    Clinical Impression Statement Mrs. Jaworowski continues to demonstrate limited knee ROM of -15 to 41 degrees following Edema reduction massage. patient completed all exercises but reported soreness in the knee during all exercises with diffiuclty performing SAQ and SLR. had pt perform SAQ bil  to facilitate increased  L quad activation. During treatment she reports that she doesn't like pain and would frequently report pain intermittently  during treatment.    PT Next Visit Plan standing hip strength, possibly trying taping for knee pain, HEP, MANUAL for edema  Problem List Patient Active Problem List   Diagnosis Date Noted  . Left hip pain 10/08/2014  . Type 2 diabetes mellitus without complication 29/79/8921  . Hyperlipidemia LDL goal <100 10/08/2014  . Essential hypertension, benign 08/30/2014  . Diabetes mellitus, type 2 08/30/2014  . Diverticulosis of colon without hemorrhage 08/30/2014  . Chronic low back pain 08/30/2014  . Chronic pain syndrome 08/30/2014  . Asthma in adult without complication 19/41/7408  . 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 Osteoarthritis of left knee 02/23/2012   Starr Lake PT, DPT, LAT, ATC  01/24/2015  12:13 PM    [place] [facility] [address] [place], [place], [address] Phone: [phone]   Fax:  [phone]

## 2015-01-30 ENCOUNTER — Ambulatory Visit: Payer: Medicare Other | Attending: Orthopedic Surgery | Admitting: Physical Therapy

## 2015-01-30 DIAGNOSIS — R269 Unspecified abnormalities of gait and mobility: Secondary | ICD-10-CM | POA: Insufficient documentation

## 2015-01-30 DIAGNOSIS — M79605 Pain in left leg: Secondary | ICD-10-CM | POA: Insufficient documentation

## 2015-01-30 NOTE — Therapy (Signed)
[place], [place], [address] Phone: [phone]   Fax:  [phone]  Physical Therapy Treatment  Patient Details  Name: Tabitha Oneal MRN: [id number] Date of Birth: [birth year] Referring Provider:  Gildardo Cranker, DO  Encounter Date: 01/30/2015      PT End of Session - 01/30/15 1213    Visit Number 6   Number of Visits 17   Date for PT Re-Evaluation 03/05/15   PT Start Time 0930   PT Stop Time 1020   PT Time Calculation (min) 50 min   Activity Tolerance Patient tolerated treatment well   Behavior During Therapy [facility] for tasks assessed/performed      Past Medical History  Diagnosis Date  . Diabetes mellitus   . Hypertension   . Asthma   . Osteoarthritis of left knee 02/23/2012  . 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 Diverticulitis   . High cholesterol   . Lower back pain   . Headache   . Chronic pain     Past Surgical History  Procedure Laterality Date  . Joint replacement      acl   bil knees  . Abdominal hysterectomy    . Total knee arthroplasty  02/23/2012    Procedure: TOTAL KNEE ARTHROPLASTY;  Surgeon: Johnny Bridge, MD;  Location: [place];  Service: Orthopedics;  Laterality: Left;  . Knee closed reduction  04/09/2012    Procedure: CLOSED MANIPULATION KNEE;  Surgeon: Johnny Bridge, MD;  Location: [place];  Service: Orthopedics;  Laterality: Left;  Manipulation Knee with Anesthesia includes Application of Traction   . Injection knee  04/09/2012    Procedure: KNEE INJECTION;  Surgeon: Johnny Bridge, MD;  Location: [place];  Service: Orthopedics;  Laterality: Left;    There were no vitals filed for this visit.  Visit Diagnosis:  Abnormality of gait  Pain of left lower extremity      Subjective Assessment - 01/30/15 1159    Subjective Pt states she has no knee pain until doing exercises that involve trying to  bend it. She used ice on it this weekend and said it was very Red afterwards for several hours, unsure why.   Currently in Pain? No/denies   Pain Location Knee   Pain Orientation Left             [facility] Adult PT Treatment/Exercise - 01/30/15 0946    Self-Care   Self-Care Other Self-Care Comments   Other Self-Care Comments  HEP form correction, knee progress, how to stand when she does not have her brace on, perform weight shifts to relieve the right leg due to it now becoming painful from compensating   Knee/Hip Exercises: Stretches   ITB Stretch Left;2 reps;30 seconds  pain with stretch in anteriolateral hip   Knee/Hip Exercises: Aerobic   Nustep 64min, resistance 3, arms used to assist in bending L knee, painful upon bending but eased up the longer she went   Knee/Hip Exercises: Seated   Long Arc Quad Strengthening;Left;1 set;15 reps   Knee/Hip Exercises: Supine   Quad Sets AROM;Strengthening;Left;2 sets;15 reps   Short Arc [employer] Strengthening;Left;1 set;15 reps   Other Supine Knee/Hip Exercises heel digs, verbal and tactile cues for how to perform exercise, 1 set x15   Knee/Hip Exercises: Sidelying   Hip ABduction Strengthening;Left;3 sets   Hip ABduction Limitations set 2 with hip extended, set 3 with hip flexed, multiple  verbal and tactile cues for technique                PT Education - 01/30/15 1212    Education provided Yes   Education Details HEP for correction, weight shift on to L LE ([place]), place towel between knee and ice pack (36min not 20) asses if any redness if so how long   Person(s) Educated Patient   Methods Explanation;Demonstration;Tactile cues;Verbal cues   Comprehension Verbalized understanding;Returned demonstration;Verbal cues required;Tactile cues required;Need further instruction          PT Short Term Goals - 01/22/15 1013    PT SHORT TERM GOAL #1   Title Pt will be I with initial HEP to continue care at home for further pain  reduction   Baseline added knee ROM today   Time 2   Period Weeks   Status Partially Met   PT SHORT TERM GOAL #2   Title Pt will report L hip pain to be 4/10 with transfers/gait for improvement in mobility   Baseline 6/10   Status On-going   PT SHORT TERM GOAL #3   Title Pt will be able to amb x 20 min with 25% less difficulty for shopping   Time 2   Period Weeks   Status On-going           PT Long Term Goals - 01/22/15 1015    PT LONG TERM GOAL #1   Title Pt will report 75% reduction in L LE pain with functional mobility   Baseline no relief   Time 4   Period Weeks   Status On-going   PT LONG TERM GOAL #2   Title Pt will report decreased L LE radiation >/= 50% to assist with improved gait in community   Time 4   Period Weeks   Status On-going   PT LONG TERM GOAL #3   Title [name] will improve to 45/56 for decreased falls risk   Time 4   Period Weeks   Status Unable to assess               Plan - 01/30/15 1213    Clinical Impression Statement Pt c/o pain during nu step ROM exercise and sidelying hip strengthening, unable to identify if the hip pain was muscular or not. Pt need continuous verbal and tactile cues in order to perform exercises correctly which results in less exercises in alotted time period for session. Pt. is curious if she will ever be able to walk normal again, informed that because it has been three years since her surgery it will take time to regain motion. Pt is still working toward achieving goals.   PT Next Visit Plan review patient progress with her and assess goals, review HEP again, tape for knee pain, closed chain strength/ROM for hip and knee   PT Home Exercise Plan nothing new added   Consulted and Agree with Plan of Care Patient        Problem List Patient Active Problem List   Diagnosis Date Noted  . Left hip pain 10/08/2014  . Type 2 diabetes mellitus without complication 19/37/9024  . Hyperlipidemia LDL goal <100 10/08/2014   . Essential hypertension, benign 08/30/2014  . Diabetes mellitus, type 2 08/30/2014  . Diverticulosis of colon without hemorrhage 08/30/2014  . Chronic low back pain 08/30/2014  . Chronic pain syndrome 08/30/2014  . Asthma in adult without complication 09/73/5329  . 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  Osteoarthritis of left knee 02/23/2012   Radonna Ricker, SPT  PAA,JENNIFER 01/30/2015, 1:22 PM  [facility] [address] [place], [place], [address] Phone: [phone]   Fax:  [phone]   Raeford Razor, PT 01/30/2015 1:22 PM Phone: [phone] Fax: [phone]

## 2015-02-01 ENCOUNTER — Ambulatory Visit: Payer: Medicare Other | Admitting: Physical Therapy

## 2015-02-01 DIAGNOSIS — R269 Unspecified abnormalities of gait and mobility: Secondary | ICD-10-CM

## 2015-02-01 DIAGNOSIS — M79605 Pain in left leg: Secondary | ICD-10-CM

## 2015-02-01 NOTE — Therapy (Signed)
[place], [place], [address] Phone: [phone]   Fax:  [phone]  Physical Therapy Treatment  Patient Details  Name: Tabitha Oneal MRN: [id number] Date of Birth: [birth year] Referring Provider:  Gildardo Cranker, DO  Encounter Date: 02/01/2015      PT End of Session - 02/01/15 1427    Visit Number 7   Number of Visits 17   Date for PT Re-Evaluation 03/05/15   PT Start Time 0934   PT Stop Time 1020   PT Time Calculation (min) 46 min   Activity Tolerance Patient tolerated treatment well;No increased pain   Behavior During Therapy [facility] for tasks assessed/performed      Past Medical History  Diagnosis Date  . Diabetes mellitus   . Hypertension   . Asthma   . Osteoarthritis of left knee 02/23/2012  . 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 Diverticulitis   . High cholesterol   . Lower back pain   . Headache   . Chronic pain     Past Surgical History  Procedure Laterality Date  . Joint replacement      acl   bil knees  . Abdominal hysterectomy    . Total knee arthroplasty  02/23/2012    Procedure: TOTAL KNEE ARTHROPLASTY;  Surgeon: Johnny Bridge, MD;  Location: [place];  Service: Orthopedics;  Laterality: Left;  . Knee closed reduction  04/09/2012    Procedure: CLOSED MANIPULATION KNEE;  Surgeon: Johnny Bridge, MD;  Location: [place];  Service: Orthopedics;  Laterality: Left;  Manipulation Knee with Anesthesia includes Application of Traction   . Injection knee  04/09/2012    Procedure: KNEE INJECTION;  Surgeon: Johnny Bridge, MD;  Location: [place];  Service: Orthopedics;  Laterality: Left;    There were no vitals filed for this visit.  Visit Diagnosis:  Abnormality of gait  Pain of left lower extremity      Subjective Assessment - 02/01/15 0937    Subjective 7/10.  more than that most days.     Currently in Pain?  Yes   Pain Score 7    Pain Location Knee   Pain Orientation Left;Lateral;Medial;Anterior   Pain Descriptors / Indicators Throbbing   Pain Type Chronic pain   Pain Radiating Towards LT BACK HIP   Aggravating Factors  [place].     Pain Relieving Factors RESTING, mEDICATION, iCE   Multiple Pain Sites --  kNEES lt.RT, bACK AND hIP lt                         [facility] Adult PT Treatment/Exercise - 02/01/15 9371    Ambulation/Gait   Gait Comments Single point cane used ,  gait much improved.  on level.  Home walking program added  5 minutes   Self-Care   Other Self-Care Comments  pain guides, discussed.  with exercise   Knee/Hip Exercises: Stretches   Quad Stretch --  gentle seated 20 seconds 3 reps.   Knee/Hip Exercises: Seated   Hamstring Limitations 10 X very little motion.     Sit to Sand --  modified for stiff knee.   Manual Therapy   Manual therapy comments Taping 1 fan for edema medial.  2 y's for   quad activation, inhibition                 PT Education - 02/01/15 1425  Education provided Yes   Education Details gait training Cane single point   [employer]) Educated Patient   Methods Explanation;Demonstration;Verbal cues   Comprehension Verbalized understanding;Returned demonstration          PT Short Term Goals - 02/01/15 1430    PT SHORT TERM GOAL #1   Title Pt will be I with initial HEP to continue care at home for further pain reduction   Time 2   Period Weeks   Status On-going   PT SHORT TERM GOAL #2   Title Pt will report L hip pain to be 4/10 with transfers/gait for improvement in mobility   Baseline 8/10 average pain number   Time 2   Period Weeks   Status On-going   PT SHORT TERM GOAL #3   Title Pt will be able to amb x 20 min with 25% less difficulty for shopping   Baseline 5 minutes in clinic   Time 2   Status On-going           PT Long Term Goals - 02/01/15 3762    PT LONG TERM GOAL #1   Title Pt will report 75%  reduction in L LE pain with functional mobility   Baseline no relief   Time 4   Period Weeks   Status On-going   PT LONG TERM GOAL #2   Title Pt will report decreased L LE radiation >/= 50% to assist with improved gait in community   Baseline no change   Time 4   Period Weeks   Status On-going   PT LONG TERM GOAL #3   Title [name] will improve to 45/56 for decreased falls risk   Time 4   Period Weeks   Status Unable to assess               Plan - 02/01/15 1428    Clinical Impression Statement tape seems to be helpful.  Trial cane , single point in clinic improved gait safety, ease.  No changes in pain or function yet noted by paitent.   PT Next Visit Plan re tape if she likes.  Gait training hip strengthening   Consulted and Agree with Plan of Care Patient        Problem List Patient Active Problem List   Diagnosis Date Noted  . Left hip pain 10/08/2014  . Type 2 diabetes mellitus without complication 83/15/1761  . Hyperlipidemia LDL goal <100 10/08/2014  . Essential hypertension, benign 08/30/2014  . Diabetes mellitus, type 2 08/30/2014  . Diverticulosis of colon without hemorrhage 08/30/2014  . Chronic low back pain 08/30/2014  . Chronic pain syndrome 08/30/2014  . Asthma in adult without complication 60/73/7106  . 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 Osteoarthritis of left knee 02/23/2012    [facility] 02/01/2015, 2:31 PM  [facility] [address] [place], [place], [address] Phone: [phone]   Fax:  [phone]     Melvenia Needles, PTA 02/01/2015 2:31 PM Phone: [phone] Fax: [phone]

## 2015-02-01 NOTE — Patient Instructions (Signed)
Remove tape if irritating.  May wear up to 5 days.

## 2015-02-02 ENCOUNTER — Other Ambulatory Visit: Payer: Self-pay | Admitting: *Deleted

## 2015-02-02 MED ORDER — METFORMIN HCL 500 MG PO TABS: ORAL_TABLET | ORAL | Status: DC

## 2015-02-02 NOTE — Telephone Encounter (Signed)
Patient requested and faxed to pharmacy 

## 2015-02-05 ENCOUNTER — Ambulatory Visit: Payer: Medicare Other | Admitting: Physical Therapy

## 2015-02-05 DIAGNOSIS — R269 Unspecified abnormalities of gait and mobility: Secondary | ICD-10-CM

## 2015-02-05 DIAGNOSIS — M79605 Pain in left leg: Secondary | ICD-10-CM | POA: Diagnosis not present

## 2015-02-05 NOTE — Patient Instructions (Signed)
Self-Mobilization: Downward Kneecap Push   With thumbs on upper border of left kneecap, gently push kneecap toward foot. Push off and on __60__ seconds. Repeat ___3_ times per set. Do ___1_ sets per session. Do _2___ sessions per day.  http://orth.exer.us/583   Copyright  VHI. All rights reserved.    Copyright  VHI. All rights reserved.  Self-Mobilization: Upward Kneecap Pull   With thumbs on lower border of left kneecap, gently pull kneecap toward hip. Push off and on  60____ seconds. Repeat __3__ times per set. Do _1___ sets per session. Do _2___ sessions per day.  http://orth.exer.us/585   Copyright  VHI. All rights reserved.  Self-Mobilization: Outward Kneecap Pull   With entire length of thumb along inner border of left kneecap, gently pull kneecap out. Push off and on 60____ seconds. Repeat __3__ times per set. Do 1____ sets per session. Do ___2_ sessions per day.  http://orth.exer.us/589   Copyright  VHI. All rights reserved.  Self-Mobilization: Inward Kneecap Push   With entire length of index finger along outer border of left kneecap, gently push kneecap in toward other leg. Push off and on 60 ____ seconds. Repeat __3__ times per set. Do _1___ sets per session. Do _2___ sessions per day.  http://orth.exer.us/587   Copyright  VHI. All rights reserved.

## 2015-02-05 NOTE — Therapy (Signed)
[facility], [place], [address] Phone: [phone]   Fax:  [phone]  Physical Therapy Treatment  Patient Details  Name: Tabitha Oneal MRN: [id number] Date of Birth: 05-02-1956 Referring Provider:  Gildardo Cranker, DO  Encounter Date: 02/05/2015      PT End of Session - 02/05/15 1204    PT Start Time 0933   PT Stop Time 1018   PT Time Calculation (min) 45 min   Activity Tolerance Patient tolerated treatment well;No increased pain   Behavior During Therapy [facility] for tasks assessed/performed      Past Medical History  Diagnosis Date  . Diabetes mellitus   . Hypertension   . Asthma   . Osteoarthritis of left knee 02/23/2012  . 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 Diverticulitis   . High cholesterol   . Lower back pain   . Headache   . Chronic pain     Past Surgical History  Procedure Laterality Date  . Joint replacement      acl   bil knees  . Abdominal hysterectomy    . Total knee arthroplasty  02/23/2012    Procedure: TOTAL KNEE ARTHROPLASTY;  Surgeon: Johnny Bridge, MD;  Location: [facility];  Service: Orthopedics;  Laterality: Left;  . Knee closed reduction  04/09/2012    Procedure: CLOSED MANIPULATION KNEE;  Surgeon: Johnny Bridge, MD;  Location: [facility];  Service: Orthopedics;  Laterality: Left;  Manipulation Knee with Anesthesia includes Application of Traction   . Injection knee  04/09/2012    Procedure: KNEE INJECTION;  Surgeon: Johnny Bridge, MD;  Location: [facility];  Service: Orthopedics;  Laterality: Left;    There were no vitals filed for this visit.  Visit Diagnosis:  Pain of left lower extremity  Abnormality of gait      Subjective Assessment - 02/05/15 0937    Subjective 7/10.  Using cane a few days.  Doing her home exercises.  She has them memorized.  Can walk 5 minutes  2 times a day.    Currently in  Pain? Yes   Pain Score 7    Pain Location Knee   Pain Orientation Left;Anterior;Lateral;Medial   Pain Descriptors / Indicators Throbbing   Pain Type Chronic pain   Pain Frequency Constant   Aggravating Factors  walking, sleeping   Pain Relieving Factors resting. medication ice.   Multiple Pain Sites No                         [facility] Adult PT Treatment/Exercise - 02/05/15 0941    Self-Care   Other Self-Care Comments  exercise, walking pain, edema, patellar , brace application, cane adjustment etc today   Manual Therapy   Manual Therapy --  patellar mobs, retrograde taping as previous.                PT Education - 02/05/15 1010    Education provided Yes   Education Details patellar mobilization.  Proper application of brace   Person(s) Educated Patient   Methods Explanation;Demonstration;Tactile cues;Verbal cues;Handout   Comprehension Verbalized understanding;Returned demonstration          PT Short Term Goals - 02/05/15 1210    PT SHORT TERM GOAL #1   Title Pt will be I with initial HEP to continue care at home for further pain reduction   Baseline added patallar mobilization today.  Time 2   Period Weeks   Status On-going   PT SHORT TERM GOAL #2   Title Pt will report L hip pain to be 4/10 with transfers/gait for improvement in mobility   Time 2   Period Weeks   Status On-going   PT SHORT TERM GOAL #3   Title Pt will be able to amb x 20 min with 25% less difficulty for shopping   Baseline 5 minutes 2 X a day   Time 2   Period Weeks   Status On-going           PT Long Term Goals - 02/01/15 2542    PT LONG TERM GOAL #1   Title Pt will report 75% reduction in L LE pain with functional mobility   Baseline no relief   Time 4   Period Weeks   Status On-going   PT LONG TERM GOAL #2   Title Pt will report decreased L LE radiation >/= 50% to assist with improved gait in community   Baseline no change   Time 4   Period Weeks    Status On-going   PT LONG TERM GOAL #3   Title [name] will improve to 45/56 for decreased falls risk   Time 4   Period Weeks   Status Unable to assess               Plan - 02/05/15 1208    PT Next Visit Plan review patellar mobilization, single leg stands with moving opposite leg.   PT Home Exercise Plan patellar mobilization   Consulted and Agree with Plan of Care Patient        Problem List Patient Active Problem List   Diagnosis Date Noted  . Left hip pain 10/08/2014  . Type 2 diabetes mellitus without complication 70/62/3762  . Hyperlipidemia LDL goal <100 10/08/2014  . Essential hypertension, benign 08/30/2014  . Diabetes mellitus, type 2 08/30/2014  . Diverticulosis of colon without hemorrhage 08/30/2014  . Chronic low back pain 08/30/2014  . Chronic pain syndrome 08/30/2014  . Asthma in adult without complication 83/15/1761  . 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 Osteoarthritis of left knee 02/23/2012    [facility] 02/05/2015, 12:14 PM  [facility] [address] [place], [place], [address] Phone: [phone]   Fax:  [phone]     [facility]itha Oneal, PTA 02/05/2015 12:14 PM Phone: [phone] Fax: [phone]

## 2015-02-08 ENCOUNTER — Ambulatory Visit: Payer: Medicare Other | Admitting: Physical Therapy

## 2015-02-08 DIAGNOSIS — R269 Unspecified abnormalities of gait and mobility: Secondary | ICD-10-CM | POA: Diagnosis not present

## 2015-02-08 DIAGNOSIS — M79605 Pain in left leg: Secondary | ICD-10-CM | POA: Diagnosis not present

## 2015-02-08 NOTE — Patient Instructions (Signed)
   Julio Storr PT, DPT, LAT, ATC  [facility] Phone: [phone]     

## 2015-02-08 NOTE — Therapy (Signed)
[place], [place], [address] Phone: [phone]   Fax:  [phone]  Physical Therapy Treatment  Patient Details  Name: Tabitha Oneal MRN: [id number] Date of Birth: 06/04/1955 Referring Provider:  Gildardo Cranker, DO  Encounter Date: 02/08/2015      PT End of Session - 02/08/15 1056    Visit Number 10   Number of Visits 17   Date for PT Re-Evaluation 03/05/15   PT Start Time 0930   PT Stop Time 1030   PT Time Calculation (min) 60 min   Activity Tolerance Patient tolerated treatment well   Behavior During Therapy [facility] for tasks assessed/performed      Past Medical History  Diagnosis Date  . Diabetes mellitus   . Hypertension   . Asthma   . Osteoarthritis of left knee 02/23/2012  . 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 Diverticulitis   . High cholesterol   . Lower back pain   . Headache   . Chronic pain     Past Surgical History  Procedure Laterality Date  . Joint replacement      acl   bil knees  . Abdominal hysterectomy    . Total knee arthroplasty  02/23/2012    Procedure: TOTAL KNEE ARTHROPLASTY;  Surgeon: Johnny Bridge, MD;  Location: [place];  Service: Orthopedics;  Laterality: Left;  . Knee closed reduction  04/09/2012    Procedure: CLOSED MANIPULATION KNEE;  Surgeon: Johnny Bridge, MD;  Location: [place];  Service: Orthopedics;  Laterality: Left;  Manipulation Knee with Anesthesia includes Application of Traction   . Injection knee  04/09/2012    Procedure: KNEE INJECTION;  Surgeon: Johnny Bridge, MD;  Location: [place];  Service: Orthopedics;  Laterality: Left;    There were no vitals filed for this visit.  Visit Diagnosis:  Pain of left lower extremity  Abnormality of gait      Subjective Assessment - 02/08/15 0936    Subjective "I am having alittle more pain today and didn't want to get up this  morning"   Currently in Pain? Yes   Pain Score 8    Pain Location Knee   Pain Orientation Left;Lateral;Medial   Pain Descriptors / Indicators Sharp;Throbbing   Pain Type Chronic pain   Pain Onset More than a month ago   Pain Frequency Constant                         [facility] Adult PT Treatment/Exercise - 02/08/15 1010    Knee/Hip Exercises: Standing   Other Standing Knee Exercises standing hip ext/abd, and hamstring curls x 15 each   with HHA on table.    Knee/Hip Exercises: Supine   Heel Slides AAROM;Left;1 set;10 reps  with strap, holding at end range for 10 seconds   Modalities   Modalities Moist Heat;Electrical Stimulation   Moist Heat Therapy   Number Minutes Moist Heat 15 Minutes  knee   Moist Heat Location Knee   Electrical Stimulation   Electrical Stimulation Location left knee   Electrical Stimulation Action IFC   Electrical Stimulation Parameters 15 min, 100% scan, leve 6   Electrical Stimulation Goals Pain   Manual Therapy   Manual therapy comments Taping 1 fan for edema medial.  2 y's for    Joint Mobilization Grade 2 L knee P<>A mobilizations, and grade 2-3 patellar mobs in all directions.  PT Education - 02/08/15 1055    Education provided Yes   Education Details updated HEP   Person(s) Educated Patient   Methods Explanation   Comprehension Verbalized understanding          PT Short Term Goals - 02/05/15 1210    PT SHORT TERM GOAL #1   Title Pt will be I with initial HEP to continue care at home for further pain reduction   Baseline added patallar mobilization today.   Time 2   Period Weeks   Status On-going   PT SHORT TERM GOAL #2   Title Pt will report L hip pain to be 4/10 with transfers/gait for improvement in mobility   Time 2   Period Weeks   Status On-going   PT SHORT TERM GOAL #3   Title Pt will be able to amb x 20 min with 25% less difficulty for shopping   Baseline 5 minutes 2 X a day   Time 2    Period Weeks   Status On-going           PT Long Term Goals - 02/01/15 6222    PT LONG TERM GOAL #1   Title Pt will report 75% reduction in L LE pain with functional mobility   Baseline no relief   Time 4   Period Weeks   Status On-going   PT LONG TERM GOAL #2   Title Pt will report decreased L LE radiation >/= 50% to assist with improved gait in community   Baseline no change   Time 4   Period Weeks   Status On-going   PT LONG TERM GOAL #3   Title Tabitha Oneal will improve to 45/56 for decreased falls risk   Time 4   Period Weeks   Status Unable to assess               Plan - 02/08/15 1056    Clinical Impression Statement Tabitha Oneal reported increased pain in the knee today. utilitzed e-stim and heat to calm down pain prior to treatment which she rpeorted helped. She was able to finish all exercises with only complaint being fatigue and mild pain in the hip/knee. pt reports she feels like she is making progress with therapy would like to continue with her current POC.    PT Next Visit Plan review patellar mobilization, single leg stands with moving opposite leg.   PT Home Exercise Plan standing hip abduction/ extension, standing hamstring curls   Consulted and Agree with Plan of Care Patient        Problem List Patient Active Problem List   Diagnosis Date Noted  . Left hip pain 10/08/2014  . Type 2 diabetes mellitus without complication 97/98/9211  . Hyperlipidemia LDL goal <100 10/08/2014  . Essential hypertension, benign 08/30/2014  . Diabetes mellitus, type 2 08/30/2014  . Diverticulosis of colon without hemorrhage 08/30/2014  . Chronic low back pain 08/30/2014  . Chronic pain syndrome 08/30/2014  . Asthma in adult without complication 94/17/4081  . 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 Osteoarthritis of left knee 02/23/2012   Starr [place] PT, DPT, LAT, ATC  02/08/2015  11:05 AM      [facility] [address] [place], [place], [address] Phone: [phone]   Fax:  [phone]

## 2015-02-26 ENCOUNTER — Ambulatory Visit: Payer: Medicare Other | Attending: Orthopedic Surgery | Admitting: Physical Therapy

## 2015-02-26 DIAGNOSIS — R269 Unspecified abnormalities of gait and mobility: Secondary | ICD-10-CM | POA: Insufficient documentation

## 2015-02-26 DIAGNOSIS — M79605 Pain in left leg: Secondary | ICD-10-CM | POA: Diagnosis not present

## 2015-02-26 NOTE — Therapy (Signed)
[place], [place], [address] Phone: [phone]   Fax:  [phone]  Physical Therapy Treatment  Patient Details  Name: [facility] MRN: [id number] Date of Birth: 1956-02-16 Referring Provider:  Gildardo Cranker, DO  Encounter Date: 02/26/2015      PT End of Session - 02/26/15 1014    Visit Number 11   Number of Visits 17   Date for PT Re-Evaluation 03/05/15   PT Start Time 0935   PT Stop Time 1015   PT Time Calculation (min) 40 min   Activity Tolerance Patient tolerated treatment well;No increased pain   Behavior During Therapy [facility] for tasks assessed/performed      Past Medical History  Diagnosis Date  . Diabetes mellitus   . Hypertension   . Asthma   . Osteoarthritis of left knee 02/23/2012  . 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 Diverticulitis   . High cholesterol   . Lower back pain   . Headache   . Chronic pain     Past Surgical History  Procedure Laterality Date  . Joint replacement      acl   bil knees  . Abdominal hysterectomy    . Total knee arthroplasty  02/23/2012    Procedure: TOTAL KNEE ARTHROPLASTY;  Surgeon: Johnny Bridge, MD;  Location: [place];  Service: Orthopedics;  Laterality: Left;  . Knee closed reduction  04/09/2012    Procedure: CLOSED MANIPULATION KNEE;  Surgeon: Johnny Bridge, MD;  Location: [place];  Service: Orthopedics;  Laterality: Left;  Manipulation Knee with Anesthesia includes Application of Traction   . Injection knee  04/09/2012    Procedure: KNEE INJECTION;  Surgeon: Johnny Bridge, MD;  Location: [place];  Service: Orthopedics;  Laterality: Left;    There were no vitals filed for this visit.  Visit Diagnosis:  Pain of left lower extremity  Abnormality of gait      Subjective Assessment - 02/26/15 0932    Subjective 7/10 .  Able to bend knee more.  Pain is about the same.      Currently in Pain? Yes   Pain Score 7    Pain Location Knee   Pain Orientation Left   Pain Descriptors / Indicators Shooting   Pain Radiating Towards back and hip   Aggravating Factors  getting up from sitting, stand too long.    Pain Relieving Factors pillows, home TENS   Effect of Pain on Daily Activities keeps her up at night.   Multiple Pain Sites No                         [facility] Adult PT Treatment/Exercise - 02/26/15 0942    Knee/Hip Exercises: Aerobic   Nustep 6 minutes  180 steps   Knee/Hip Exercises: Standing   Heel Raises 10 reps;2 sets  both, single  each 10 X,  holding chair. close SBA   Knee Flexion 10 reps   Knee Flexion Limitations limited motion, 0 LBS   Hip Flexion 10 reps  5 LBS at ankle.   Hip Abduction 10 reps  5 LBS , 2 hand holds, SBA   Abduction Limitations Rt leg fatigued.     Manual Therapy   Manual therapy comments tape removed.                  PT Short Term Goals - 02/26/15  [place] #1   Time 2   Period Weeks   Status On-going   PT SHORT TERM GOAL #2   Title Pt will report L hip pain to be 4/10 with transfers/gait for improvement in mobility   Time 2   Period Weeks   Status On-going   PT SHORT TERM GOAL #3   Title Pt will be able to amb x 20 min with 25% less difficulty for shopping   Time 2   Period Weeks   Status On-going           PT Long Term Goals - 02/01/15 0102    PT LONG TERM GOAL #1   Title Pt will report 75% reduction in L LE pain with functional mobility   Baseline no relief   Time 4   Period Weeks   Status On-going   PT LONG TERM GOAL #2   Title Pt will report decreased L LE radiation >/= 50% to assist with improved gait in community   Baseline no change   Time 4   Period Weeks   Status On-going   PT LONG TERM GOAL #3   Title [name] will improve to 45/56 for decreased falls risk   Time 4   Period Weeks   Status Unable to assess               Plan -  02/26/15 1015    Clinical Impression Statement Pain 6/10 post session.  Exercise requires frequent rests.  Parellar mobilizations painful with hands so showed her how to do sitting superior glides with quad set.     PT Next Visit Plan , single leg stands with moving opposite leg.    Heat cold handout,    Problem List Patient Active Problem List   Diagnosis Date Noted  . Left hip pain 10/08/2014  . Type 2 diabetes mellitus without complication ([place]) 72/53/6644  . Hyperlipidemia LDL goal <100 10/08/2014  . Essential hypertension, benign 08/30/2014  . Diabetes mellitus, type 2 ([place]) 08/30/2014  . Diverticulosis of colon without hemorrhage 08/30/2014  . Chronic low back pain 08/30/2014  . Chronic pain syndrome 08/30/2014  . Asthma in adult without complication 03/47/4259  . 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 Osteoarthritis of left knee 02/23/2012    [facility] 02/26/2015, 10:18 AM  [facility] [address] [place], [place], [address] Phone: [phone]   Fax:  [phone]     Melvenia Needles, PTA 02/26/2015 10:18 AM Phone: [phone] Fax: [phone]

## 2015-02-28 ENCOUNTER — Ambulatory Visit: Payer: Medicare Other | Admitting: Physical Therapy

## 2015-02-28 DIAGNOSIS — R269 Unspecified abnormalities of gait and mobility: Secondary | ICD-10-CM | POA: Diagnosis not present

## 2015-02-28 DIAGNOSIS — M79605 Pain in left leg: Secondary | ICD-10-CM

## 2015-02-28 NOTE — Patient Instructions (Signed)
Replace heel lift if needed. Soft tissue work to hip musculature to decrease soreness.

## 2015-02-28 NOTE — Therapy (Signed)
[facility], [place], [address] Phone: [phone]   Fax:  [phone]  Physical Therapy Treatment  Patient Details  Name: [facility] MRN: [id number] Date of Birth: 04-01-1956 Referring Provider:  Gildardo Cranker, DO  Encounter Date: 02/28/2015      PT End of Session - 02/28/15 1015    Visit Number 12   Number of Visits 17   Date for PT Re-Evaluation 03/05/15   PT Start Time 0932   PT Stop Time 1014   PT Time Calculation (min) 42 min   Activity Tolerance Patient tolerated treatment well;Patient limited by pain   Behavior During Therapy [facility] for tasks assessed/performed      Past Medical History  Diagnosis Date  . Diabetes mellitus   . Hypertension   . Asthma   . Osteoarthritis of left knee 02/23/2012  . 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 Diverticulitis   . High cholesterol   . Lower back pain   . Headache   . Chronic pain     Past Surgical History  Procedure Laterality Date  . Joint replacement      acl   bil knees  . Abdominal hysterectomy    . Total knee arthroplasty  02/23/2012    Procedure: TOTAL KNEE ARTHROPLASTY;  Surgeon: [facility]a Bridge, MD;  Location: [place];  Service: Orthopedics;  Laterality: Left;  . Knee closed reduction  04/09/2012    Procedure: CLOSED MANIPULATION KNEE;  Surgeon: [facility]itha Bridge, MD;  Location: [place];  Service: Orthopedics;  Laterality: Left;  Manipulation Knee with Anesthesia includes Application of Traction   . Injection knee  04/09/2012    Procedure: KNEE INJECTION;  Surgeon: [facility]itha Bridge, MD;  Location: [place];  Service: Orthopedics;  Laterality: Left;    There were no vitals filed for this visit.  Visit Diagnosis:  Pain of left lower extremity  Abnormality of gait      Subjective Assessment - 02/28/15 0937    Subjective 5/10 . Sore   Currently in Pain? Yes   Pain Score  5    Pain Location Knee   Pain Orientation Left   Pain Descriptors / Indicators Sore   Pain Type Chronic pain   Pain Radiating Towards back and lateral hip   Aggravating Factors  walking.   Pain Relieving Factors pillows home TENS   Multiple Pain Sites No  Lt hip pain 5/10 intermittant worse with walking.                           [place] Adult PT Treatment/Exercise - 02/28/15 0932    Ambulation/Gait   Pre-Gait Activities leg lengthener supine and standing to drop LT hip.  10 X each   Gait Comments heel lift removed, gait 200 feet +/_ 30.  2 rests nededed.  Pain and fatigue limit.  SPC moved to RT hand.     Knee/Hip Exercises: Supine   Quad Sets AROM   Quad Sets Limitations 20 X 5 second holds.     Heel Slides AAROM   Heel Slides Limitations very stiff 5 X   Straight Leg Raises AAROM  10 X painful anterior hip.   Knee/Hip Exercises: Sidelying   Hip ABduction Strengthening   Hip ABduction Limitations 10 X 2 sets painful Lateral hip, better after soft tissue work.     Manual Therapy   Manual therapy  comments soft tissue work LT IT AREA proximal with foam roller.                  PT Education - 02/28/15 1252    Education provided Yes   Education Details heel lift, soft tissue work.   Person(s) Educated Patient   Methods Explanation   Comprehension Verbalized understanding;Returned demonstration          PT Short Term Goals - 02/26/15 1016    PT SHORT TERM GOAL #1   Time 2   Period Weeks   Status On-going   PT SHORT TERM GOAL #2   Title Pt will report L hip pain to be 4/10 with transfers/gait for improvement in mobility   Time 2   Period Weeks   Status On-going   PT SHORT TERM GOAL #3   Title Pt will be able to amb x 20 min with 25% less difficulty for shopping   Time 2   Period Weeks   Status On-going           PT Long Term Goals - 02/01/15 0383    PT LONG TERM GOAL #1   Title Pt will report 75% reduction in L LE pain with  functional mobility   Baseline no relief   Time 4   Period Weeks   Status On-going   PT LONG TERM GOAL #2   Title Pt will report decreased L LE radiation >/= 50% to assist with improved gait in community   Baseline no change   Time 4   Period Weeks   Status On-going   PT LONG TERM GOAL #3   Title Tab[facility]a Oneal will improve to 45/56 for decreased falls risk   Time 4   Period Weeks   Status Unable to assess               Plan - 02/28/15 1016    Clinical Impression Statement Patient continues to fatigue quickly, She is not used to walking 200+ feet,  Pain limits many activities, she is willing to do most anything to get better, No new goals met.          Problem List Patient Active Problem List   Diagnosis Date Noted  . Left hip pain 10/08/2014  . Type 2 diabetes mellitus without complication ([place]) 33/83/2919  . Hyperlipidemia LDL goal <100 10/08/2014  . Essential hypertension, benign 08/30/2014  . Diabetes mellitus, type 2 ([place]) 08/30/2014  . Diverticulosis of colon without hemorrhage 08/30/2014  . Chronic low back pain 08/30/2014  . Chronic pain syndrome 08/30/2014  . Asthma in adult without complication 16/60/6004  . 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 Osteoarthritis of left knee 02/23/2012    [facility] 02/28/2015, 12:54 PM  [facility] [address] [place], [place], [address] Phone: [phone]   Fax:  [phone]     Melvenia Needles, PTA 02/28/2015 12:54 PM Phone: [phone] Fax: [phone]

## 2015-03-05 ENCOUNTER — Ambulatory Visit: Payer: Medicare Other | Admitting: Physical Therapy

## 2015-03-05 DIAGNOSIS — R269 Unspecified abnormalities of gait and mobility: Secondary | ICD-10-CM

## 2015-03-05 DIAGNOSIS — M79605 Pain in left leg: Secondary | ICD-10-CM | POA: Diagnosis not present

## 2015-03-05 NOTE — Therapy (Signed)
[place], [place], [address] Phone: [phone]   Fax:  [phone]  Physical Therapy Treatment / Re-certification  Patient Details  Name: Tabitha Oneal MRN: [id number] Date of Birth: 1956/03/22 Referring Provider:  Meredith Pel, MD  Encounter Date: 03/05/2015      PT End of Session - 03/05/15 0940    Visit Number 13   Number of Visits 17   Date for PT Re-Evaluation 04/02/15   PT Start Time 0930   Activity Tolerance Patient tolerated treatment well   Behavior During Therapy [facility] for tasks assessed/performed      Past Medical History  Diagnosis Date  . Diabetes mellitus   . Hypertension   . Asthma   . Osteoarthritis of left knee 02/23/2012  . 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 Diverticulitis   . High cholesterol   . Lower back pain   . Headache   . Chronic pain     Past Surgical History  Procedure Laterality Date  . Joint replacement      acl   bil knees  . Abdominal hysterectomy    . Total knee arthroplasty  02/23/2012    Procedure: TOTAL KNEE ARTHROPLASTY;  Surgeon: Johnny Bridge, MD;  Location: [place];  Service: Orthopedics;  Laterality: Left;  . Knee closed reduction  04/09/2012    Procedure: CLOSED MANIPULATION KNEE;  Surgeon: Johnny Bridge, MD;  Location: [place];  Service: Orthopedics;  Laterality: Left;  Manipulation Knee with Anesthesia includes Application of Traction   . Injection knee  04/09/2012    Procedure: KNEE INJECTION;  Surgeon: Johnny Bridge, MD;  Location: [place];  Service: Orthopedics;  Laterality: Left;    There were no vitals filed for this visit.  Visit Diagnosis:  Pain of left lower extremity - Plan: PT plan of care cert/re-cert  Abnormality of gait - Plan: PT plan of care cert/re-cert      Subjective Assessment - 03/05/15 0936    Subjective "I feel like I am still the same  since the first visit, and the pain today is a 7/10   Currently in Pain? Yes   Pain Score 7    Pain Location Knee   Pain Orientation Left   Pain Descriptors / Indicators Sore   Pain Type Chronic pain   Pain Onset More than a month ago   Pain Frequency Constant   Aggravating Factors  walking, standing, steps   Pain Relieving Factors pillows, home and TENS unit            [facility] PT Assessment - 03/05/15 0937    Assessment   Medical Diagnosis L hip pain   Balance Screen   Has the patient fallen in the past 6 months No   Has the patient had a decrease in activity level because of a fear of falling?  No   Is the patient reluctant to leave their home because of a fear of falling?  No   Prior Function   Vocation Unemployed   Cognition   Overall Cognitive Status Impaired/Different from baseline   Area of Impairment Attention;Following commands;Awareness   Observation/Other Assessments   Observations HOH   Lower Extremity Functional Scale  12/80   ROM / Strength   AROM / PROM / Strength PROM;Strength   AROM   Left Knee Extension -12  pain at end range   Left Knee Flexion 38  pain  at end range   PROM   PROM Assessment Site Knee   Right/Left Knee Left   Left Knee Extension -10  pain at end range   Left Knee Flexion 42  pain at endrange   Strength   Strength Assessment Site Knee;Hip   Right/Left Hip Right;Left   Right Hip Flexion 4/5   Right Hip Extension 4/5   Right/Left Knee Right;Left   Right Knee Flexion 4+/5   Right Knee Extension 4+/5   Left Knee Flexion 2+/5  pain during testing   Left Knee Extension 2+/5  pain during testing   Standardized Balance Assessment   Standardized Balance Assessment Berg Balance Test   Berg Balance Test   Sit to Stand Needs minimal aid to stand or to stabilize   Standing Unsupported Able to stand 2 minutes with supervision   Sitting with Back Unsupported but Feet Supported on Floor or Stool Able to sit safely and securely 2 minutes    Stand to Sit Uses backs of legs against chair to control descent   Transfers Able to transfer with verbal cueing and /or supervision   Standing Unsupported with Eyes Closed Able to stand 3 seconds   Standing Ubsupported with Feet Together Able to place feet together independently but unable to hold for 30 seconds   From Standing, Reach Forward with Outstretched Arm Loses balance while trying/requires external support   From Standing Position, Pick up Object from Floor Unable to pick up shoe, but reaches 2-5 cm (1-2") from shoe and balances independently   From Standing Position, Turn to Look Behind Over each Shoulder Turn sideways only but maintains balance   Turn 360 Degrees Needs close supervision or verbal cueing   Standing Unsupported, Alternately Place Feet on Step/Stool Able to complete >2 steps/needs minimal assist   Standing Unsupported, One Foot in Front Needs help to step but can hold 15 seconds   Standing on One Leg Tries to lift leg/unable to hold 3 seconds but remains standing independently   Total Score 24   Berg comment: high fall risk                     [facility] Adult PT Treatment/Exercise - 03/05/15 0001    Self-Care   Other Self-Care Comments  to continue with her exercises to promote strengthening and mobility by increasing the reps and sets    Knee/Hip Exercises: Stretches   Passive Hamstring Stretch 2 reps;30 seconds   Knee/Hip Exercises: Aerobic   Nustep L5 x 5 min   Manual Therapy   Joint Mobilization Grade 2 L knee P<>A mobilizations, and grade 2-3 patellar mobs in all directions.                 PT Education - 03/05/15 1007    Education provided Yes   Education Details updated POC, HEP review   Person(s) Educated Patient   Methods Explanation   Comprehension Verbalized understanding          PT Short Term Goals - 03/05/15 1011    PT SHORT TERM GOAL #1   Title Pt will be I with initial HEP to continue care at home for further pain  reduction   Baseline added patallar mobilization today.   Time 2   Period Weeks   Status On-going   PT SHORT TERM GOAL #2   Title Pt will report L hip pain to be 4/10 with transfers/gait for improvement in mobility   Baseline 8/10 average pain number  Time 2   Period Weeks   Status On-going   PT SHORT TERM GOAL #3   Title Pt will be able to amb x 20 min with 25% less difficulty for shopping   Baseline 5 minutes 2 X a day   Time 2   Period Weeks   Status On-going           PT Long Term Goals - 03-07-15 1011    PT LONG TERM GOAL #1   Title Pt will report 75% reduction in L LE pain with functional mobility   Baseline no relief   Time 4   Period Weeks   Status On-going   PT LONG TERM GOAL #2   Title Pt will report decreased L LE radiation >/= 50% to assist with improved gait in community   Baseline no change   Time 4   Period Weeks   Status On-going   PT LONG TERM GOAL #3   Title [name] will improve to 45/56 for decreased falls risk   Time 4   Period Weeks   Status On-going               Plan - Mar 07, 2015 1007    Clinical Impression Statement Kida continues to demonstrate limited knee AROM and strength secondary to limited mobility and pain. she demonstrates swelling in the L knee with pain rated at 7/10. No new goals met. discussed with pt to give 4 more weeks of 1 x  a week visits, and if she continues to dmeonstrate little or no progress in mobility or decreased pain we may have to refer her back to her physician for other options. Plan to continue with her current POC to work toward her goals.    Pt will benefit from skilled therapeutic intervention in order to improve on the following deficits Abnormal gait;Decreased activity tolerance;Difficulty walking;Pain;Impaired flexibility   Rehab Potential Good   PT Frequency 1x / week   PT Duration 4 weeks   PT Treatment/Interventions Iontophoresis 4mg /ml Dexamethasone;Moist Heat;Ultrasound;Cryotherapy;Gait  training;Functional mobility training;Therapeutic activities;Therapeutic exercise;Balance training;Neuromuscular re-education;Patient/family education;Manual techniques;Taping;Dry needling   PT Next Visit Plan , single leg stands with moving opposite leg, manual for mobility,    PT Home Exercise Plan standing hip abduction/ extension, standing hamstring curls   Consulted and Agree with Plan of Care Patient          G-Codes - 03/07/2015 1012    Functional Assessment Tool Used LEFS   Functional Limitation Mobility: Walking and moving around   Mobility: Walking and Moving Around Current Status [phone]) At least 60 percent but less than 80 percent impaired, limited or restricted   Mobility: Walking and Moving Around Goal Status [phone]) At least 40 percent but less than 60 percent impaired, limited or restricted      Problem List Patient Active Problem List   Diagnosis Date Noted  . Left hip pain 10/08/2014  . Type 2 diabetes mellitus without complication ([place]) 89/16/9450  . Hyperlipidemia LDL goal <100 10/08/2014  . Essential hypertension, benign 08/30/2014  . Diabetes mellitus, type 2 ([place]) 08/30/2014  . Diverticulosis of colon without hemorrhage 08/30/2014  . Chronic low back pain 08/30/2014  . Chronic pain syndrome 08/30/2014  . Asthma in adult without complication 38/88/2800  . 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 Osteoarthritis of left knee 02/23/2012   Starr Lake PT, DPT, LAT, ATC  March 07, 2015  10:21 AM      [facility]  [place], [place], [address] Phone: [phone]   Fax:  [phone]

## 2015-03-07 ENCOUNTER — Encounter: Payer: Self-pay | Admitting: Physical Therapy

## 2015-03-12 ENCOUNTER — Ambulatory Visit: Payer: Medicare Other | Admitting: Physical Therapy

## 2015-03-12 DIAGNOSIS — R269 Unspecified abnormalities of gait and mobility: Secondary | ICD-10-CM

## 2015-03-12 DIAGNOSIS — M79605 Pain in left leg: Secondary | ICD-10-CM | POA: Diagnosis not present

## 2015-03-12 NOTE — Therapy (Signed)
[place], [place], [address] Phone: [phone]   Fax:  [phone]  Physical Therapy Treatment  Patient Details  Name: Tabitha Oneal MRN: [id number] Date of Birth: 21-Mar-1956 No Data Recorded  Encounter Date: 03/12/2015      PT End of Session - 03/12/15 1014    Visit Number 14   Number of Visits 17   Date for PT Re-Evaluation 04/02/15   PT Start Time 0930   PT Stop Time 1025   PT Time Calculation (min) 55 min   Activity Tolerance Patient tolerated treatment well   Behavior During Therapy [facility] for tasks assessed/performed      Past Medical History  Diagnosis Date  . Diabetes mellitus   . Hypertension   . Asthma   . Osteoarthritis of left knee 02/23/2012  . 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 Diverticulitis   . High cholesterol   . Lower back pain   . Headache   . Chronic pain     Past Surgical History  Procedure Laterality Date  . Joint replacement      acl   bil knees  . Abdominal hysterectomy    . Total knee arthroplasty  02/23/2012    Procedure: TOTAL KNEE ARTHROPLASTY;  Surgeon: Johnny Bridge, MD;  Location: [place];  Service: Orthopedics;  Laterality: Left;  . Knee closed reduction  04/09/2012    Procedure: CLOSED MANIPULATION KNEE;  Surgeon: Johnny Bridge, MD;  Location: [place];  Service: Orthopedics;  Laterality: Left;  Manipulation Knee with Anesthesia includes Application of Traction   . Injection knee  04/09/2012    Procedure: KNEE INJECTION;  Surgeon: Johnny Bridge, MD;  Location: [place];  Service: Orthopedics;  Laterality: Left;    There were no vitals filed for this visit.  Visit Diagnosis:  Pain of left lower extremity  Abnormality of gait      Subjective Assessment - 03/12/15 0946    Subjective "my pain level is a little more up today"   Currently in Pain? Yes   Pain Score 8    Pain  Location Knee   Pain Orientation Left   Pain Descriptors / Indicators Sore   Pain Type Chronic pain   Pain Onset More than a month ago   Pain Frequency Constant                         [facility] Adult PT Treatment/Exercise - 03/12/15 0947    Self-Care   Self-Care Other Self-Care Comments   Other Self-Care Comments  discussed that burning in the muscles during exercise is normal and that it should dissipate after the exercise has stopped   Knee/Hip Exercises: Stretches   Passive Hamstring Stretch 2 reps;30 seconds  with strap   Knee/Hip Exercises: Aerobic   Nustep L2 x 8 min  focus on bending the knee   Knee/Hip Exercises: Standing   Heel Raises Both;20 reps   SLS with Vectors hip abduction/ extension, flexion, adduction x 15 ea direction  reported increased shaking in the RLE, bil hand hold chair   Other Standing Knee Exercises standing knee flexion x 15 each  with 2# on L LE   Knee/Hip Exercises: Supine   Quad Sets AROM;20 reps;2 sets   Straight Leg Raises AROM;Strengthening;Left;1 set;10 reps  2#   Straight Leg Raises Limitations frequent asking if the leg is up, reports she  can't tell if it is raised   Cryotherapy   Number Minutes Cryotherapy 10 Minutes   Cryotherapy Location Knee  L   Type of Cryotherapy Ice pack  in supine with elevation   Manual Therapy   Joint Mobilization Grade 2 L knee P<>A mobilizations, and grade 2-3 patellar mobs in all directions.                 PT Education - 03/12/15 1013    Education provided Yes   Education Details HEP review, and importance of hip strength   Person(s) Educated Patient   Methods Explanation   Comprehension Verbalized understanding          PT Short Term Goals - 03/05/15 1011    PT SHORT TERM GOAL #1   Title Pt will be I with initial HEP to continue care at home for further pain reduction   Baseline added patallar mobilization today.   Time 2   Period Weeks   Status On-going   PT SHORT  TERM GOAL #2   Title Pt will report L hip pain to be 4/10 with transfers/gait for improvement in mobility   Baseline 8/10 average pain number   Time 2   Period Weeks   Status On-going   PT SHORT TERM GOAL #3   Title Pt will be able to amb x 20 min with 25% less difficulty for shopping   Baseline 5 minutes 2 X a day   Time 2   Period Weeks   Status On-going           PT Long Term Goals - 03/05/15 1011    PT LONG TERM GOAL #1   Title Pt will report 75% reduction in L LE pain with functional mobility   Baseline no relief   Time 4   Period Weeks   Status On-going   PT LONG TERM GOAL #2   Title Pt will report decreased L LE radiation >/= 50% to assist with improved gait in community   Baseline no change   Time 4   Period Weeks   Status On-going   PT LONG TERM GOAL #3   Title Tabitha Oneal will improve to 45/56 for decreased falls risk   Time 4   Period Weeks   Status On-going               Plan - 03/12/15 1014    Clinical Impression Statement Tabitha Oneal reports she has pain in the knee the last couple of days.  Following NuStep exercise she reported feeling unstable in her R knee due to weakness and instability in the L. Focused todays treament on the standing hip strengthening which she was able to complete requiring resting and bil hand hold on chair for support due to increaed fatigue in the RLE. pt opted for ice following tx due to soreness.    PT Next Visit Plan , single leg stands with moving opposite leg, manual for mobility, manual for knee mobility,    PT Home Exercise Plan standing hip abduction/ extension, standing hamstring curls   Consulted and Agree with Plan of Care Patient        Problem List Patient Active Problem List   Diagnosis Date Noted  . Left hip pain 10/08/2014  . Type 2 diabetes mellitus without complication ([place]) 14/78/2956  . Hyperlipidemia LDL goal <100 10/08/2014  . Essential hypertension, benign 08/30/2014  . Diabetes mellitus, type 2  ([place]) 08/30/2014  . Diverticulosis of colon without hemorrhage 08/30/2014  .  Chronic low back pain 08/30/2014  . Chronic pain syndrome 08/30/2014  . Asthma in adult without complication 91/79/1505  . 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 Osteoarthritis of left knee 02/23/2012   Starr Lake PT, DPT, LAT, ATC  03/12/2015  10:17 AM      [facility] [address] [place], [place], [address] Phone: [phone]   Fax:  [phone]  Name: Tabitha Oneal MRN: [id number] Date of Birth: Nov 06, 1955

## 2015-03-14 ENCOUNTER — Encounter: Payer: Self-pay | Admitting: Physical Therapy

## 2015-03-17 ENCOUNTER — Other Ambulatory Visit: Payer: Self-pay | Admitting: Internal Medicine

## 2015-03-19 ENCOUNTER — Ambulatory Visit: Payer: Medicare Other | Admitting: Physical Therapy

## 2015-03-19 ENCOUNTER — Other Ambulatory Visit: Payer: Self-pay

## 2015-03-19 DIAGNOSIS — R269 Unspecified abnormalities of gait and mobility: Secondary | ICD-10-CM | POA: Diagnosis not present

## 2015-03-19 DIAGNOSIS — M79605 Pain in left leg: Secondary | ICD-10-CM

## 2015-03-19 MED ORDER — QUINAPRIL HCL 20 MG PO TABS: 20.0000 mg | ORAL_TABLET | Freq: Every day | ORAL | Status: DC

## 2015-03-19 NOTE — Therapy (Signed)
[place], [place], [address] Phone: [phone]   Fax:  [phone]  Physical Therapy Treatment  Patient Details  Name: Tabitha Oneal MRN: [id number] Date of Birth: 1956-02-26 No Data Recorded  Encounter Date: 03/19/2015      PT End of Session - 03/19/15 1008    Visit Number 15   Number of Visits 17   Date for PT Re-Evaluation 04/02/15   PT Start Time 0930   PT Stop Time 1025   PT Time Calculation (min) 55 min   Activity Tolerance Patient tolerated treatment well   Behavior During Therapy [facility] for tasks assessed/performed      Past Medical History  Diagnosis Date  . Diabetes mellitus   . Hypertension   . Asthma   . Osteoarthritis of left knee 02/23/2012  . 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 Diverticulitis   . High cholesterol   . Lower back pain   . Headache   . Chronic pain     Past Surgical History  Procedure Laterality Date  . Joint replacement      acl   bil knees  . Abdominal hysterectomy    . Total knee arthroplasty  02/23/2012    Procedure: TOTAL KNEE ARTHROPLASTY;  Surgeon: Johnny Bridge, MD;  Location: [place];  Service: Orthopedics;  Laterality: Left;  . Knee closed reduction  04/09/2012    Procedure: CLOSED MANIPULATION KNEE;  Surgeon: Johnny Bridge, MD;  Location: [place];  Service: Orthopedics;  Laterality: Left;  Manipulation Knee with Anesthesia includes Application of Traction   . Injection knee  04/09/2012    Procedure: KNEE INJECTION;  Surgeon: Johnny Bridge, MD;  Location: [place];  Service: Orthopedics;  Laterality: Left;    There were no vitals filed for this visit.  Visit Diagnosis:  Pain of left lower extremity  Abnormality of gait      Subjective Assessment - 03/19/15 0939    Subjective "I wasn't able to get any sleep because of my knee really hurting today"    Currently in Pain?  Yes   Pain Score 8    Pain Location Knee   Pain Orientation Left   Pain Descriptors / Indicators Aching;Sore   Pain Type Chronic pain   Pain Onset More than a month ago   Pain Frequency Constant   Aggravating Factors  walking, standing, steps   Pain Relieving Factors pillows, home, TENS unit                         [facility] Adult PT Treatment/Exercise - 03/19/15 0942    Knee/Hip Exercises: Stretches   Passive Hamstring Stretch 2 reps;30 seconds   Knee/Hip Exercises: Aerobic   Nustep L2 x 8 min  both UE/LE to increase knee flexion   Knee/Hip Exercises: Standing   Heel Raises Both;20 reps   Knee Flexion 10 reps   Knee Flexion Limitations limited motion, 0 LBS   Hip Abduction AROM;Stengthening;Both;2 sets;10 reps   Hip Extension AROM;[place];Both   Wall Squat 2 sets;5 reps   Other Standing Knee Exercises standing knee flexion x 15 each   Knee/Hip Exercises: Seated   Sit to Sand 1 set;5 reps  from elevated table   Knee/Hip Exercises: Supine   Short Arc Quad Sets AROM;Strengthening;Left;1 set;10 reps  4#   Bridges Limitations bridges 1 x 10  modified with bolster beneath knees  Straight Leg Raises AROM;Strengthening;Left;1 set;10 reps   Knee/Hip Exercises: Sidelying   Hip ABduction Strengthening   Hip ABduction Limitations 10 X 2 sets painful Lateral hip, better after soft tissue work.     Cryotherapy   Number Minutes Cryotherapy 10 Minutes   Cryotherapy Location Knee   Type of Cryotherapy Ice pack   Electrical Stimulation   Electrical Stimulation Location left knee   Electrical Stimulation Action IFC   Electrical Stimulation Parameters 10 min, scan 100%, L    Electrical Stimulation Goals Pain   Manual Therapy   Joint Mobilization Grade 2 L knee P<>A mobilizations, and grade 2-3 patellar mobs in all directions.    Soft tissue mobilization Retrograde/ anterograde massage over L knee to assist with edema reduction.                 PT Education  - 03/19/15 1008    Education provided No          PT Short Term Goals - 03/19/15 1011    PT SHORT TERM GOAL #1   Title Pt will be I with initial HEP to continue care at home for further pain reduction   Baseline added patallar mobilization today.   Time 2   Period Weeks   Status On-going   PT SHORT TERM GOAL #2   Title Pt will report L hip pain to be 4/10 with transfers/gait for improvement in mobility   Baseline 8/10 average pain number   Time 2   Period Weeks   Status On-going   PT SHORT TERM GOAL #3   Title Pt will be able to amb x 20 min with 25% less difficulty for shopping   Baseline 5 minutes 2 X a day   Time 2   Period Weeks   Status On-going           PT Long Term Goals - 03/19/15 1012    PT LONG TERM GOAL #1   Title Pt will report 75% reduction in L LE pain with functional mobility   Baseline no relief   Time 4   Period Weeks   Status On-going   PT LONG TERM GOAL #2   Title Pt will report decreased L LE radiation >/= 50% to assist with improved gait in community   Time 4   Period Weeks   Status On-going   PT LONG TERM GOAL #3   Title [name] will improve to 45/56 for decreased falls risk   Time 4   Period Weeks   Status On-going               Plan - 03/19/15 1009    Clinical Impression Statement Marquisa continues to report that she is having increased pain in the knee which is limiting her sleep. She was able to perform all exercises today with report of soreness in the knee that continues to stay around a 8/10. Followng tx opted for E-stim and ice to help decrease pain which she reporte some relief. Discussed with pt that if she continues to demonstrate no changes or improvement with pain or mobility by the next two visits we may discharge and refer back to her doctor for alternatives.    PT Next Visit Plan , single leg stands with moving opposite leg, manual for mobility, manual for knee mobility,    Consulted and Agree with Plan of Care Patient         Problem List Patient Active Problem List   Diagnosis Date  Noted  . Left hip pain 10/08/2014  . Type 2 diabetes mellitus without complication ([place]) 29/79/8921  . Hyperlipidemia LDL goal <100 10/08/2014  . Essential hypertension, benign 08/30/2014  . Diabetes mellitus, type 2 ([place]) 08/30/2014  . Diverticulosis of colon without hemorrhage 08/30/2014  . Chronic low back pain 08/30/2014  . Chronic pain syndrome 08/30/2014  . Asthma in adult without complication 19/41/7408  . 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 Osteoarthritis of left knee 02/23/2012   Starr Lake PT, DPT, LAT, ATC  03/19/2015  10:31 AM   [facility] [address] [place], [place], [address] Phone: [phone]   Fax:  [phone]  Name: RAKHI ROMAGNOLI MRN: [id number] Date of Birth: 06-Mar-1956

## 2015-03-21 ENCOUNTER — Encounter: Payer: Self-pay | Admitting: Physical Therapy

## 2015-03-26 DIAGNOSIS — M25552 Pain in left hip: Secondary | ICD-10-CM | POA: Diagnosis not present

## 2015-03-26 DIAGNOSIS — M545 Low back pain: Secondary | ICD-10-CM | POA: Diagnosis not present

## 2015-03-26 DIAGNOSIS — F4320 Adjustment disorder, unspecified: Secondary | ICD-10-CM | POA: Diagnosis not present

## 2015-03-26 DIAGNOSIS — G890 Central pain syndrome: Secondary | ICD-10-CM | POA: Diagnosis not present

## 2015-03-26 DIAGNOSIS — Z79891 Long term (current) use of opiate analgesic: Secondary | ICD-10-CM | POA: Diagnosis not present

## 2015-03-26 DIAGNOSIS — M25562 Pain in left knee: Secondary | ICD-10-CM | POA: Diagnosis not present

## 2015-04-07 ENCOUNTER — Other Ambulatory Visit: Payer: Self-pay | Admitting: Internal Medicine

## 2015-04-09 ENCOUNTER — Other Ambulatory Visit: Payer: Self-pay

## 2015-04-11 ENCOUNTER — Other Ambulatory Visit: Payer: Medicare Other

## 2015-04-11 DIAGNOSIS — E119 Type 2 diabetes mellitus without complications: Secondary | ICD-10-CM | POA: Diagnosis not present

## 2015-04-11 DIAGNOSIS — E785 Hyperlipidemia, unspecified: Secondary | ICD-10-CM

## 2015-04-12 ENCOUNTER — Ambulatory Visit (INDEPENDENT_AMBULATORY_CARE_PROVIDER_SITE_OTHER): Payer: Medicare Other | Admitting: Allergy and Immunology

## 2015-04-12 ENCOUNTER — Other Ambulatory Visit: Payer: Self-pay | Admitting: Allergy and Immunology

## 2015-04-12 ENCOUNTER — Encounter: Payer: Self-pay | Admitting: Allergy and Immunology

## 2015-04-12 VITALS — BP 120/76 | HR 82 | Temp 97.6°F | Resp 18

## 2015-04-12 DIAGNOSIS — H1010 Acute atopic conjunctivitis, unspecified eye: Secondary | ICD-10-CM | POA: Diagnosis not present

## 2015-04-12 DIAGNOSIS — J4530 Mild persistent asthma, uncomplicated: Secondary | ICD-10-CM | POA: Diagnosis not present

## 2015-04-12 DIAGNOSIS — J309 Allergic rhinitis, unspecified: Secondary | ICD-10-CM | POA: Diagnosis not present

## 2015-04-12 DIAGNOSIS — J3089 Other allergic rhinitis: Secondary | ICD-10-CM

## 2015-04-12 LAB — BASIC METABOLIC PANEL
BUN / CREAT RATIO: 10 — AB (ref 11–26)
BUN: 7 mg/dL — ABNORMAL LOW (ref 8–27)
CHLORIDE: 102 mmol/L (ref 97–106)
CO2: 27 mmol/L (ref 18–29)
Calcium: 9.7 mg/dL (ref 8.7–10.3)
Creatinine, Ser: 0.7 mg/dL (ref 0.57–1.00)
GFR calc non Af Amer: 94 mL/min/{1.73_m2} (ref >59)
GFR, EST AFRICAN AMERICAN: 109 mL/min/{1.73_m2} (ref >59)
GLUCOSE: 108 mg/dL — AB (ref 65–99)
POTASSIUM: 4 mmol/L (ref 3.5–5.2)
SODIUM: 143 mmol/L (ref 136–144)

## 2015-04-12 LAB — LIPID PANEL
CHOL/HDL RATIO: 2.6 ratio (ref 0.0–4.4)
Cholesterol, Total: 129 mg/dL (ref 100–199)
HDL: 50 mg/dL (ref >39)
LDL CALC: 64 mg/dL (ref 0–99)
TRIGLYCERIDES: 77 mg/dL (ref 0–149)
VLDL CHOLESTEROL CAL: 15 mg/dL (ref 5–40)

## 2015-04-12 LAB — ALT: ALT: 13 IU/L (ref 0–32)

## 2015-04-12 LAB — HEMOGLOBIN A1C
Est. average glucose Bld gHb Est-mCnc: 140 mg/dL
Hgb A1c MFr Bld: 6.5 % — ABNORMAL HIGH (ref 4.8–5.6)

## 2015-04-12 MED ORDER — MONTELUKAST SODIUM 10 MG PO TABS: 10.0000 mg | ORAL_TABLET | Freq: Every day | ORAL | Status: DC

## 2015-04-12 NOTE — Patient Instructions (Signed)
Take Home Sheet  1. Avoidance: Mite, Mold and Pollen   2. Antihistamine: Loratadine 10mg   by mouth once daily for runny nose or itching as needed.   3. Nasal Spray: Azelatstine 1-2 spray(s) each nostril once to twice daily for stuffy nose or drainage.    4. Inhalers:  Rescue: ProAir 2 puffs every 4 hours as needed for cough or wheeze.       -May use 2 puffs 10-20 minutes prior to exercise.   Preventative: QVAR 61mcg 2 puffs twice daily (Rinse, gargle, and spit out after use).     May decrease in the winter as previously discussed if able.  5.  Continue Singulair 10mg  once daily.   6. Other: Saline nasal wash each evening at bath/shower time.   7. Follow up Visit:  6 months or sooner if needed.   Websites that have reliable Patient information: 1. American Academy of Asthma, Allergy, & Immunology: www.aaaai.org 2. Food Allergy Network: www.foodallergy.org 3. Mothers of Asthmatics: www.aanma.org 4. [employer]: DiningCalendar.de 5. American College of Allergy, Asthma, & Immunology: https://robertson.info/ or www.acaai.org

## 2015-04-12 NOTE — Telephone Encounter (Signed)
Placed order for montelukast 10mg  and pended for Dr Ishmael Holter. Dr Ishmael Holter notified .

## 2015-04-12 NOTE — Telephone Encounter (Signed)
Let dr hicks know she is still on the monteukast 10 mg at night and needs to have that call in.

## 2015-04-12 NOTE — Progress Notes (Signed)
FOLLOW UP NOTE  RE: Tabitha Oneal MRN: [id number] DOB: [birth year] [facility] [place] [address] [place] [address] Date of Office Visit: 04/12/2015  Subjective:  Tabitha Oneal is a 59 y.o. female who presents today for Cough  Assessment:   1. Mild persistent asthma, excellent in office spirometry and normal lung exam.    2. Allergic rhinoconjunctivitis.       Plan:   Patient Instructions   1. Avoidance: Mite, Mold and Pollen 2. Antihistamine: Loratadine 10mg   by mouth once daily for runny nose or itching as needed. 3. Nasal Spray: Azelatstine 1-2 spray(s) each nostril once to twice daily for stuffy nose or drainage.  4. Inhalers:  Rescue: ProAir 2 puffs every 4 hours as needed for cough or wheeze.       -May use 2 puffs 10-20 minutes prior to exercise.  Preventative: QVAR 95mcg 2 puffs twice daily (Rinse, gargle, and spit out after use).     May decrease in the winter as previously discussed if able. 5.  Continue Singulair 10mg  once daily. 6.  Other: Saline nasal wash each evening at bath/shower time. 7.  Follow up Visit:  6 months or sooner if needed.   HPI: Tabitha Oneal returns to the office in follow-up of allergic rhinitis and asthma.  Since her last visit in May, she describes generally doing well.  In the last few days she has noted postnasal drip, slight throat clearing cough and a question of a throaty "wheeze".  She denies chest congestion, chest tightness, difficulty breathing, shortness of breath, fever, sore throat or headache.  She denies any recent urgent care or emergency department visits, prednisone or antibiotic courses.  She feels her sleep and activity are normal.  She is not using any oral or nasal spray antihistamines.  She is requesting refills on medications.  She reported Singulair was started by her primary which she thought was beneficial, though recently used intermittently.  No  recent albuterol use.  Current Medications: 1.  Qvar 80 g 2 puffs once to twice daily. 2.  ProAir HFA and Singulair as needed. 3.  Continues Metformin, Amitiza, Excedrin, Lipitor, Baclofen, Voltaren and Mobic, Accupril, Tradjenta, and Prilosec. 4.  As needed Oxycodone.  Drug Allergies: No Known Allergies  Objective:   Filed Vitals:   04/12/15 1006  BP: 120/76  Pulse: 82  Temp: 97.6 F (36.4 C)  Resp: 18   Physical Exam  Constitutional: She is well-developed, well-nourished, and in no distress.  HENT:  Head: Atraumatic.  Right Ear: Tympanic membrane and ear canal normal.  Left Ear: Tympanic membrane and ear canal normal.  Nose: Mucosal edema present. No rhinorrhea. No epistaxis.  Mouth/Throat: Oropharynx is clear and moist and mucous membranes are normal. No oropharyngeal exudate, posterior oropharyngeal edema or posterior oropharyngeal erythema.  Neck: Neck supple.  Cardiovascular: Normal rate, S1 normal and S2 normal.   No murmur heard. Pulmonary/Chest: Effort normal. She has no wheezes. She has no rhonchi. She has no rales.  Lymphadenopathy:    She has no cervical adenopathy.    Diagnostics: Spirometry FVC 2.55--97%, FEV1 2.09-- 99%.    Tabitha M. Ishmael Holter, MD  cc: Gildardo Cranker, M.D.

## 2015-04-13 ENCOUNTER — Encounter: Payer: Self-pay | Admitting: Internal Medicine

## 2015-04-13 ENCOUNTER — Ambulatory Visit (INDEPENDENT_AMBULATORY_CARE_PROVIDER_SITE_OTHER): Payer: Medicare Other | Admitting: Internal Medicine

## 2015-04-13 ENCOUNTER — Telehealth: Payer: Self-pay

## 2015-04-13 VITALS — BP 118/86 | HR 66 | Temp 98.1°F | Resp 20 | Ht 63.0 in | Wt 198.6 lb

## 2015-04-13 DIAGNOSIS — E119 Type 2 diabetes mellitus without complications: Secondary | ICD-10-CM

## 2015-04-13 DIAGNOSIS — I10 Essential (primary) hypertension: Secondary | ICD-10-CM

## 2015-04-13 DIAGNOSIS — R011 Cardiac murmur, unspecified: Secondary | ICD-10-CM | POA: Diagnosis not present

## 2015-04-13 DIAGNOSIS — R0989 Other specified symptoms and signs involving the circulatory and respiratory systems: Secondary | ICD-10-CM

## 2015-04-13 DIAGNOSIS — R1013 Epigastric pain: Secondary | ICD-10-CM | POA: Diagnosis not present

## 2015-04-13 DIAGNOSIS — J301 Allergic rhinitis due to pollen: Secondary | ICD-10-CM | POA: Diagnosis not present

## 2015-04-13 DIAGNOSIS — E785 Hyperlipidemia, unspecified: Secondary | ICD-10-CM

## 2015-04-13 DIAGNOSIS — M25562 Pain in left knee: Secondary | ICD-10-CM

## 2015-04-13 DIAGNOSIS — Z23 Encounter for immunization: Secondary | ICD-10-CM

## 2015-04-13 DIAGNOSIS — J45909 Unspecified asthma, uncomplicated: Secondary | ICD-10-CM | POA: Diagnosis not present

## 2015-04-13 MED ORDER — MONTELUKAST SODIUM 10 MG PO TABS: 10.0000 mg | ORAL_TABLET | Freq: Every day | ORAL | Status: DC

## 2015-04-13 MED ORDER — OMEPRAZOLE 40 MG PO CPDR: 40.0000 mg | DELAYED_RELEASE_CAPSULE | Freq: Every day | ORAL | Status: DC

## 2015-04-13 MED ORDER — LORATADINE 10 MG PO TABS: 10.0000 mg | ORAL_TABLET | Freq: Every day | ORAL | Status: DC

## 2015-04-13 NOTE — Telephone Encounter (Signed)
Called and spoke to patient to let her know about her up coming test that Dr. Eulas Post had ordered for her so she would not be alarmed per Dr. Eulas Post.

## 2015-04-13 NOTE — Progress Notes (Signed)
Patient ID: Tabitha Oneal, female   DOB: March 16, 1956, 59 y.o.   MRN: [id number]    Location:    [facility]   Place of Service:   OFFICE  Chief Complaint  Patient presents with  . Medical Management of Chronic Issues    3 month follow-up for Hypertension, Dm, Hyperlipidemia  . Immunizations    Flu shot given    HPI:  59 yo female seen today for f/u. She reports OS appears blurry at times. She had an eye exam recently and no changes seen.  DM - she does not check BS on a regular basis. Controlled overall. She takes metformin and tradjenta. No low BS reactions. occasional tingling in left foot.  HTN - BP controlled on quinapril  Hyperlipidemia - noticed increased HA and myalgias with lower dose of statin  Asthma - followed by asthma specialist. She takes singulair, Qvar, prn albuterol HFA and astelin spray. She has sinus HA and post nasal drip in AM.   OA/chronic pain - continues to have left knee pain and is followed by Ortho Dr Nicki Reaper. She wears left knee brace. Completed PT. Exercise tolerance low due to pain. Constipation stable on amitiza. She takes oxycodone TID, baclofen and meloxicam. She uses a cane to ambulate  Past Medical History  Diagnosis Date  . Diabetes mellitus   . Hypertension   . Asthma   . Osteoarthritis of left knee 02/23/2012  . 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 Diverticulitis   . High cholesterol   . Lower back pain   . Headache   . Chronic pain     Past Surgical History  Procedure Laterality Date  . Joint replacement      acl   bil knees  . Abdominal hysterectomy    . Total knee arthroplasty  02/23/2012    Procedure: TOTAL KNEE ARTHROPLASTY;  Surgeon: Johnny Bridge, MD;  Location: [facility];  Service: Orthopedics;  Laterality: Left;  . Knee closed reduction  04/09/2012    Procedure: CLOSED MANIPULATION KNEE;  Surgeon: Johnny Bridge, MD;  Location: [facility];  Service: Orthopedics;  Laterality: Left;   Manipulation Knee with Anesthesia includes Application of Traction   . Injection knee  04/09/2012    Procedure: KNEE INJECTION;  Surgeon: Johnny Bridge, MD;  Location: [facility];  Service: Orthopedics;  Laterality: Left;    Patient Care Team: Gildardo Cranker, DO as PCP - General (Internal Medicine)  Social History   Social History  . Marital Status: Widowed    Spouse Name: N/A  . Number of Children: N/A  . Years of Education: N/A   Occupational History  . Not on file.   Social History Main Topics  . Smoking status: Never Smoker   . Smokeless tobacco: Never Used  . Alcohol Use: No  . Drug Use: No  . Sexual Activity: Not on file   Other Topics Concern  . Not on file   Social History Narrative   Diet: none   Do you drink/eat things with caffeine ? Coffee    Material status: widow     What year were you married? 08/01/1978   Do you live in a house, apartment, assisted living,condo, trailer,ect.)? Townhouse (temporary)    Is it one or more stories? Yes   How many persons live in your home? Two   Do you have any pets in your home ? Yes, Shih-tzu   Current or past profession:  none   Do you exercise? No  Type & how often: no   Do you have a living will ? No   Do you have a DNR form? No   If not, do you want to discuss one?  Not now   Do you have signed POA /HPOA forms? No    If so, please bring to your appointment.                                      reports that she has never smoked. She has never used smokeless tobacco. She reports that she does not drink alcohol or use illicit drugs.  No Known Allergies  Medications: Patient's Medications  New Prescriptions   No medications on file  Previous Medications   ALBUTEROL (PROVENTIL HFA;VENTOLIN HFA) 108 (90 BASE) MCG/ACT INHALER    Inhale 2 puffs into the lungs every 4 (four) hours as needed. For wheezing   AMITIZA 24 MCG CAPSULE    Take 1 capsule (24 mcg total) by mouth 2 (two) times daily with a  meal.   ASPIRIN-ACETAMINOPHEN-CAFFEINE (EXCEDRIN MIGRAINE) 250-250-65 MG PER TABLET    Take 2 tablets by mouth daily.   ATORVASTATIN (LIPITOR) 40 MG TABLET    Take 1 tablet (40 mg total) by mouth daily.   AZELASTINE (ASTELIN) 137 MCG/SPRAY NASAL SPRAY    Place 1 spray into the nose 2 (two) times daily. Use in each nostril as directed   BACLOFEN (LIORESAL) 10 MG TABLET    Take 10 mg by mouth. 1 in morning 2 at bed time   BECLOMETHASONE (QVAR) 80 MCG/ACT INHALER    Inhale 2 puffs into the lungs 2 (two) times daily.   DICLOFENAC SODIUM (VOLTAREN) 1 % GEL    Apply 8 g topically 2 (two) times daily. On knees   MELOXICAM (MOBIC) 15 MG TABLET    Take 15 mg by mouth daily.   METFORMIN (GLUCOPHAGE) 500 MG TABLET    Take one tablet by mouth twice daily to control blood sugar   MONTELUKAST (SINGULAIR) 10 MG TABLET    Take 1 tablet (10 mg total) by mouth at bedtime.   MONTELUKAST (SINGULAIR) 10 MG TABLET    Take 1 tablet (10 mg total) by mouth at bedtime.   ONE TOUCH ULTRA TEST TEST STRIP    Check blood sugar once daily as directed   OXYCODONE HCL 10 MG TABS    Take 10 mg by mouth 3 (three) times daily.   QUINAPRIL (ACCUPRIL) 20 MG TABLET    Take 1 tablet (20 mg total) by mouth at bedtime.   TRADJENTA 5 MG TABS TABLET    TAKE 1 TABLET EVERY DAY  Modified Medications   No medications on file  Discontinued Medications   No medications on file    Review of Systems  Unable to perform ROS: Other    Filed Vitals:   04/13/15 1136  BP: 118/86  Pulse: 66  Temp: 98.1 F (36.7 C)  TempSrc: Oral  Resp: 20  Height: 5\' 3"  (1.6 m)  Weight: 198 lb 9.6 oz (90.084 kg)  SpO2: 98%   Body mass index is 35.19 kg/(m^2).  Physical Exam  Neck: Carotid bruit is present (b/l).  Cardiovascular:  Murmur (2/6 SEM) heard. Abdominal: There is tenderness (epigastric).  Musculoskeletal: She exhibits edema (left knee) and tenderness.  Left knee brace intact; reduced ROM in left knee  Neurological:  She is alert.    Skin: Skin is warm and dry. No rash noted.  Psychiatric: She has a normal mood and affect. Her behavior is normal.     Labs reviewed: Appointment on 04/11/2015  Component Date Value Ref Range Status  . Glucose 04/11/2015 108* 65 - 99 mg/dL Final  . BUN 04/11/2015 7* 8 - 27 mg/dL Final  . Creatinine, Ser 04/11/2015 0.70  0.57 - 1.00 mg/dL Final  . GFR calc non Af Amer 04/11/2015 94  >59 mL/min/1.73 Final  . GFR calc Af Amer 04/11/2015 109  >59 mL/min/1.73 Final  . BUN/Creatinine Ratio 04/11/2015 10* 11 - 26 Final  . Sodium 04/11/2015 143  136 - 144 mmol/L Final  . Potassium 04/11/2015 4.0  3.5 - 5.2 mmol/L Final  . Chloride 04/11/2015 102  97 - 106 mmol/L Final  . CO2 04/11/2015 27  18 - 29 mmol/L Final  . Calcium 04/11/2015 9.7  8.7 - 10.3 mg/dL Final  . ALT 04/11/2015 13  0 - 32 IU/L Final  . Hgb A1c MFr Bld 04/11/2015 6.5* 4.8 - 5.6 % Final   Comment:          Pre-diabetes: 5.7 - 6.4          Diabetes: >6.4          Glycemic control for adults with diabetes: <7.0   . Est. average glucose Bld gHb Est-m* 04/11/2015 140   Final  . Cholesterol, Total 04/11/2015 129  100 - 199 mg/dL Final  . Triglycerides 04/11/2015 77  0 - 149 mg/dL Final  . HDL 04/11/2015 50  >39 mg/dL Final  . VLDL Cholesterol Cal 04/11/2015 15  5 - 40 mg/dL Final  . LDL Calculated 04/11/2015 64  0 - 99 mg/dL Final  . Chol/HDL Ratio 04/11/2015 2.6  0.0 - 4.4 ratio units Final   Comment:                                   T. Chol/HDL Ratio                                             Men  Women                               1/2 Avg.Risk  3.4    3.3                                   Avg.Risk  5.0    4.4                                2X Avg.Risk  9.6    7.1                                3X Avg.Risk 23.4   11.0     No results found.   Assessment/Plan   ICD-9-CM ICD-10-CM   1. Seasonal allergic rhinitis due to pollen - uncontrolled 477.0 J30.1 loratadine (CLARITIN) 10 MG tablet  2. Asthma in adult without  complication -  stable 493.90 J45.909   3. Need for prophylactic vaccination and inoculation against influenza V04.81 Z23 Flu Vaccine QUAD 36+ mos PF IM (Fluarix & Fluzone Quad PF)  4. Essential hypertension, benign - stable 401.1 I10 CANCELED: CMP  5. Type 2 diabetes mellitus without complication, without long-term current use of insulin (HCC) - stable 250.00 E11.9 Carotid     Echocardiogram     CMP     Hemoglobin A1c     CANCELED: CMP     CANCELED: Hemoglobin A1c  6. Hyperlipidemia LDL goal <100 - stable 272.4 E78.5 Carotid     Echocardiogram     CMP     Lipid Panel     CANCELED: Lipid Panel  7. Left knee pain due to OA; hx TKR 719.46 M25.562   8. Dyspepsia - new 536.8 R10.13 omeprazole (PRILOSEC) 40 MG capsule  9. Bilateral carotid bruits - new 785.9 R09.89 Carotid  10. Heart murmur - new 785.2 R01.1 Echocardiogram    Increase aerobic exercise as tolerated  Will call with imaging results  Trial loratidine 10mg  daily for seasonal allergy  Take omeprazole daily as needed for acid reflux symptoms  Continue other medications as ordered  Follow up with specialists as scheduled  Follow up in 3 mos for routine visit. Fasting labs prior to appt  [place] S. Perlie Gold  [facility] [address] [place], [place] [address] [phone] Cell (Monday-Friday 8 AM - 5 PM) [phone] After 5 PM and follow prompts

## 2015-04-13 NOTE — Patient Instructions (Signed)
Trial loratidine 10mg  daily for seasonal allergy  Take omeprazole daily as needed for acid reflux symptoms  Continue other medications as ordered  Follow up with specialists as scheduled  Follow up in 3 mos for routine visit. Fasting labs prior to appt

## 2015-04-22 ENCOUNTER — Other Ambulatory Visit: Payer: Self-pay | Admitting: Internal Medicine

## 2015-04-23 DIAGNOSIS — G8929 Other chronic pain: Secondary | ICD-10-CM | POA: Diagnosis not present

## 2015-04-23 DIAGNOSIS — M545 Low back pain: Secondary | ICD-10-CM | POA: Diagnosis not present

## 2015-04-23 DIAGNOSIS — Z79891 Long term (current) use of opiate analgesic: Secondary | ICD-10-CM | POA: Diagnosis not present

## 2015-05-09 DIAGNOSIS — F4320 Adjustment disorder, unspecified: Secondary | ICD-10-CM | POA: Diagnosis not present

## 2015-06-06 DIAGNOSIS — M25562 Pain in left knee: Secondary | ICD-10-CM | POA: Diagnosis not present

## 2015-06-06 DIAGNOSIS — Z79891 Long term (current) use of opiate analgesic: Secondary | ICD-10-CM | POA: Diagnosis not present

## 2015-06-06 DIAGNOSIS — G894 Chronic pain syndrome: Secondary | ICD-10-CM | POA: Diagnosis not present

## 2015-06-06 DIAGNOSIS — M25561 Pain in right knee: Secondary | ICD-10-CM | POA: Diagnosis not present

## 2015-06-14 ENCOUNTER — Other Ambulatory Visit: Payer: Self-pay | Admitting: *Deleted

## 2015-06-14 ENCOUNTER — Other Ambulatory Visit: Payer: Self-pay | Admitting: Internal Medicine

## 2015-06-14 MED ORDER — QUINAPRIL HCL 20 MG PO TABS: 20.0000 mg | ORAL_TABLET | Freq: Every day | ORAL | Status: DC

## 2015-06-16 ENCOUNTER — Other Ambulatory Visit: Payer: Self-pay | Admitting: Internal Medicine

## 2015-07-05 DIAGNOSIS — G894 Chronic pain syndrome: Secondary | ICD-10-CM | POA: Diagnosis not present

## 2015-07-05 DIAGNOSIS — M25562 Pain in left knee: Secondary | ICD-10-CM | POA: Diagnosis not present

## 2015-07-05 DIAGNOSIS — M25561 Pain in right knee: Secondary | ICD-10-CM | POA: Diagnosis not present

## 2015-07-05 DIAGNOSIS — M545 Low back pain: Secondary | ICD-10-CM | POA: Diagnosis not present

## 2015-07-05 DIAGNOSIS — Z79891 Long term (current) use of opiate analgesic: Secondary | ICD-10-CM | POA: Diagnosis not present

## 2015-07-19 ENCOUNTER — Other Ambulatory Visit (HOSPITAL_COMMUNITY): Payer: Self-pay | Admitting: Orthopedic Surgery

## 2015-07-19 DIAGNOSIS — M25562 Pain in left knee: Secondary | ICD-10-CM

## 2015-07-19 DIAGNOSIS — Z96652 Presence of left artificial knee joint: Secondary | ICD-10-CM | POA: Diagnosis not present

## 2015-07-23 ENCOUNTER — Other Ambulatory Visit: Payer: Self-pay | Admitting: *Deleted

## 2015-07-23 MED ORDER — AMITIZA 24 MCG PO CAPS: 24.0000 ug | ORAL_CAPSULE | Freq: Two times a day (BID) | ORAL | Status: DC

## 2015-07-23 NOTE — Telephone Encounter (Signed)
[facility] [address] 

## 2015-07-26 ENCOUNTER — Encounter (HOSPITAL_COMMUNITY)
Admission: RE | Admit: 2015-07-26 | Discharge: 2015-07-26 | Disposition: A | Discharge status: Home / Self Care | Payer: Medicare Other | Source: Ambulatory Visit | Attending: Orthopedic Surgery | Admitting: Orthopedic Surgery

## 2015-07-26 ENCOUNTER — Ambulatory Visit (HOSPITAL_COMMUNITY)
Admission: RE | Admit: 2015-07-26 | Discharge: 2015-07-26 | Disposition: A | Discharge status: Home / Self Care | Payer: Medicare Other | Source: Ambulatory Visit | Attending: Orthopedic Surgery | Admitting: Orthopedic Surgery

## 2015-07-26 DIAGNOSIS — R936 Abnormal findings on diagnostic imaging of limbs: Secondary | ICD-10-CM | POA: Insufficient documentation

## 2015-07-26 DIAGNOSIS — M25562 Pain in left knee: Secondary | ICD-10-CM | POA: Diagnosis not present

## 2015-07-26 DIAGNOSIS — Z96652 Presence of left artificial knee joint: Secondary | ICD-10-CM | POA: Diagnosis not present

## 2015-07-26 DIAGNOSIS — M7989 Other specified soft tissue disorders: Secondary | ICD-10-CM | POA: Diagnosis not present

## 2015-07-26 MED ORDER — TECHNETIUM TC 99M MEDRONATE IV KIT: 25.0000 | PACK | Freq: Once | INTRAVENOUS | Status: DC | PRN

## 2015-08-02 DIAGNOSIS — M25562 Pain in left knee: Secondary | ICD-10-CM | POA: Diagnosis not present

## 2015-08-03 ENCOUNTER — Emergency Department (HOSPITAL_COMMUNITY)
Admission: EM | Admit: 2015-08-03 | Discharge: 2015-08-03 | Discharge status: Against Medical Advice | Payer: Medicare Other | Attending: Emergency Medicine | Admitting: Emergency Medicine

## 2015-08-03 ENCOUNTER — Encounter (HOSPITAL_COMMUNITY): Payer: Self-pay | Admitting: Emergency Medicine

## 2015-08-03 DIAGNOSIS — G8929 Other chronic pain: Secondary | ICD-10-CM | POA: Diagnosis not present

## 2015-08-03 DIAGNOSIS — Z79899 Other long term (current) drug therapy: Secondary | ICD-10-CM | POA: Insufficient documentation

## 2015-08-03 DIAGNOSIS — Z7951 Long term (current) use of inhaled steroids: Secondary | ICD-10-CM | POA: Diagnosis not present

## 2015-08-03 DIAGNOSIS — R350 Frequency of micturition: Secondary | ICD-10-CM | POA: Diagnosis not present

## 2015-08-03 DIAGNOSIS — Z8719 Personal history of other diseases of the digestive system: Secondary | ICD-10-CM | POA: Diagnosis not present

## 2015-08-03 DIAGNOSIS — R300 Dysuria: Secondary | ICD-10-CM | POA: Diagnosis present

## 2015-08-03 DIAGNOSIS — J45909 Unspecified asthma, uncomplicated: Secondary | ICD-10-CM | POA: Diagnosis not present

## 2015-08-03 DIAGNOSIS — E7800 Pure hypercholesterolemia, unspecified: Secondary | ICD-10-CM | POA: Diagnosis not present

## 2015-08-03 DIAGNOSIS — I10 Essential (primary) hypertension: Secondary | ICD-10-CM | POA: Insufficient documentation

## 2015-08-03 DIAGNOSIS — H9190 Unspecified hearing loss, unspecified ear: Secondary | ICD-10-CM | POA: Diagnosis not present

## 2015-08-03 DIAGNOSIS — E119 Type 2 diabetes mellitus without complications: Secondary | ICD-10-CM | POA: Diagnosis not present

## 2015-08-03 DIAGNOSIS — Z7984 Long term (current) use of oral hypoglycemic drugs: Secondary | ICD-10-CM | POA: Insufficient documentation

## 2015-08-03 DIAGNOSIS — R1032 Left lower quadrant pain: Secondary | ICD-10-CM | POA: Insufficient documentation

## 2015-08-03 DIAGNOSIS — M1990 Unspecified osteoarthritis, unspecified site: Secondary | ICD-10-CM | POA: Insufficient documentation

## 2015-08-03 DIAGNOSIS — R1030 Lower abdominal pain, unspecified: Secondary | ICD-10-CM

## 2015-08-03 HISTORY — DX: Unspecified hearing loss, unspecified ear: H91.90

## 2015-08-03 LAB — BASIC METABOLIC PANEL
Anion gap: 8 (ref 5–15)
BUN: 7 mg/dL (ref 6–20)
CHLORIDE: 107 mmol/L (ref 101–111)
CO2: 26 mmol/L (ref 22–32)
CREATININE: 0.77 mg/dL (ref 0.44–1.00)
Calcium: 9.8 mg/dL (ref 8.9–10.3)
GFR calc non Af Amer: >60 mL/min (ref >60)
Glucose, Bld: 113 mg/dL — ABNORMAL HIGH (ref 65–99)
POTASSIUM: 4 mmol/L (ref 3.5–5.1)
Sodium: 141 mmol/L (ref 135–145)

## 2015-08-03 LAB — URINALYSIS, ROUTINE W REFLEX MICROSCOPIC
Bilirubin Urine: NEGATIVE
GLUCOSE, UA: NEGATIVE mg/dL
HGB URINE DIPSTICK: NEGATIVE
Ketones, ur: NEGATIVE mg/dL
Leukocytes, UA: NEGATIVE
Nitrite: NEGATIVE
Protein, ur: NEGATIVE mg/dL
SPECIFIC GRAVITY, URINE: 1.021 (ref 1.005–1.030)
pH: 6.5 (ref 5.0–8.0)

## 2015-08-03 LAB — CBC WITH DIFFERENTIAL/PLATELET
Basophils Absolute: 0 10*3/uL (ref 0.0–0.1)
Basophils Relative: 1 %
Eosinophils Absolute: 0.1 10*3/uL (ref 0.0–0.7)
Eosinophils Relative: 2 %
HEMATOCRIT: 35.1 % — AB (ref 36.0–46.0)
HEMOGLOBIN: 11.4 g/dL — AB (ref 12.0–15.0)
LYMPHS ABS: 1.7 10*3/uL (ref 0.7–4.0)
Lymphocytes Relative: 29 %
MCH: 26.1 pg (ref 26.0–34.0)
MCHC: 32.5 g/dL (ref 30.0–36.0)
MCV: 80.5 fL (ref 78.0–100.0)
MONOS PCT: 5 %
Monocytes Absolute: 0.3 10*3/uL (ref 0.1–1.0)
NEUTROS ABS: 3.7 10*3/uL (ref 1.7–7.7)
NEUTROS PCT: 63 %
Platelets: 289 10*3/uL (ref 150–400)
RBC: 4.36 MIL/uL (ref 3.87–5.11)
RDW: 14.1 % (ref 11.5–15.5)
WBC: 5.8 10*3/uL (ref 4.0–10.5)

## 2015-08-03 NOTE — ED Provider Notes (Signed)
CSN: [id number]     Arrival date & time 08/03/15  [id number] History   First MD Initiated Contact with Patient 08/03/15 1029     Chief Complaint  Patient presents with  . Urinary Tract Infection     (Consider location/radiation/quality/duration/timing/severity/associated sxs/prior Treatment) Patient is a 60 y.o. female presenting with urinary tract infection. The history is provided by the patient and medical records. No language interpreter was used.  Urinary Tract Infection Associated symptoms: no abdominal pain, no fever, no nausea, no vaginal discharge and no vomiting    Tabitha Oneal is a 60 y.o. female  with a PMH of DM, HTN, asthma, OA, HLD who presents to the Emergency Department complaining of persistent dysuria x 1 week. Associated symptoms include urinary frequency. Patient denies fever, n/v/d, vaginal discharge, vaginal bleeding. No home remedies or medications taken prior to arrival for symptoms.   Past Medical History  Diagnosis Date  . Diabetes mellitus   . Hypertension   . Asthma   . Osteoarthritis of left knee 02/23/2012  . 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 Diverticulitis   . High cholesterol   . Lower back pain   . Headache   . Chronic pain   . Hard of hearing    Past Surgical History  Procedure Laterality Date  . Joint replacement      acl   bil knees  . Abdominal hysterectomy    . Total knee arthroplasty  02/23/2012    Procedure: TOTAL KNEE ARTHROPLASTY;  Surgeon: Johnny Bridge, MD;  Location: [facility];  Service: Orthopedics;  Laterality: Left;  . Knee closed reduction  04/09/2012    Procedure: CLOSED MANIPULATION KNEE;  Surgeon: Johnny Bridge, MD;  Location: [facility];  Service: Orthopedics;  Laterality: Left;  Manipulation Knee with Anesthesia includes Application of Traction   . Injection knee  04/09/2012    Procedure: KNEE INJECTION;  Surgeon: Johnny Bridge, MD;  Location: [facility];   Service: Orthopedics;  Laterality: Left;   Family History  Problem Relation Age of Onset  . Bronchitis Father 72  . Alzheimer's disease Mother   . Diabetes Daughter   . Diabetes Daughter   . Diabetes     Social History  Substance Use Topics  . Smoking status: Never Smoker   . Smokeless tobacco: Never Used  . Alcohol Use: No   OB History    No data available     Review of Systems  Constitutional: Negative for fever and chills.  HENT: Negative for congestion.   Eyes: Negative for visual disturbance.  Respiratory: Negative for cough and shortness of breath.   Cardiovascular: Negative for chest pain.  Gastrointestinal: Negative for nausea, vomiting, abdominal pain, diarrhea and constipation.  Genitourinary: Positive for dysuria and frequency. Negative for vaginal bleeding, vaginal discharge and vaginal pain.  Musculoskeletal: Negative for back pain.  Skin: Negative for rash.  Neurological: Negative for dizziness and weakness.      Allergies  Review of patient's allergies indicates no known allergies.  Home Medications   Prior to Admission medications   Medication Sig Start Date End Date Taking? Authorizing Provider  albuterol (PROVENTIL HFA;VENTOLIN HFA) 108 (90 BASE) MCG/ACT inhaler Inhale 2 puffs into the lungs every 4 (four) hours as needed. For wheezing   Yes Historical Provider, MD  AMITIZA 24 MCG capsule Take 1 capsule (24 mcg total) by mouth 2 (two) times daily with a meal. 07/23/15  Yes Tiffany L  Reed, DO  aspirin-acetaminophen-caffeine (EXCEDRIN MIGRAINE) [phone] MG per tablet Take 2 tablets by mouth daily.   Yes Historical Provider, MD  atorvastatin (LIPITOR) 40 MG tablet Take 1 tablet (40 mg total) by mouth daily. 01/05/15  Yes Gildardo Cranker, DO  azelastine (ASTELIN) 137 MCG/SPRAY nasal spray Place 1 spray into the nose 2 (two) times daily. Use in each nostril as directed   Yes Historical Provider, MD  baclofen (LIORESAL) 10 MG tablet Take 10 mg by mouth. 1 in  morning 2 at bed time   Yes Historical Provider, MD  beclomethasone (QVAR) 80 MCG/ACT inhaler Inhale 2 puffs into the lungs 2 (two) times daily.   Yes Historical Provider, MD  diclofenac sodium (VOLTAREN) 1 % GEL Apply 8 g topically 2 (two) times daily as needed. On knees   Yes Historical Provider, MD  loratadine (CLARITIN) 10 MG tablet Take 1 tablet (10 mg total) by mouth daily. 04/13/15  Yes Gildardo Cranker, DO  metFORMIN (GLUCOPHAGE) 500 MG tablet Take one tablet by mouth twice daily to control blood sugar 02/02/15  Yes Gildardo Cranker, DO  montelukast (SINGULAIR) 10 MG tablet Take 1 tablet (10 mg total) by mouth at bedtime. 04/12/15  Yes Roselyn Malachy Moan, MD  omeprazole (PRILOSEC) 40 MG capsule Take 1 capsule (40 mg total) by mouth daily. 04/13/15  Yes Gildardo Cranker, DO  Oxycodone HCl 10 MG TABS Take 10 mg by mouth 3 (three) times daily.   Yes Historical Provider, MD  quinapril (ACCUPRIL) 20 MG tablet TAKE 1 TABLET BY MOUTH AT BEDTIME 06/18/15  Yes Monica Carter, DO  TRADJENTA 5 MG TABS tablet TAKE 1 TABLET EVERY DAY 04/23/15  Yes Gildardo Cranker, DO  meloxicam (MOBIC) 15 MG tablet Take 15 mg by mouth daily. Reported on 08/03/2015    Historical Provider, MD  montelukast (SINGULAIR) 10 MG tablet Take 1 tablet (10 mg total) by mouth at bedtime. 04/13/15   Roselyn Malachy Moan, MD  montelukast (SINGULAIR) 10 MG tablet Take 1 tablet (10 mg total) by mouth at bedtime. 04/13/15   Roselyn Malachy Moan, MD  ONE TOUCH ULTRA TEST test strip Check blood sugar once daily as directed 08/04/14   Historical Provider, MD   BP 160/67 mmHg  Pulse 62  Temp(Src) 97.6 F (36.4 C) (Oral)  Resp 18  Wt 92.534 kg  SpO2 100% Physical Exam  Constitutional: She is oriented to person, place, and time. She appears well-developed and well-nourished.  Alert and in no acute distress  HENT:  Head: Normocephalic and atraumatic.  Cardiovascular: Normal rate, regular rhythm and normal heart sounds.  Exam reveals no gallop and no friction rub.    No murmur heard. Pulmonary/Chest: Effort normal and breath sounds normal. No respiratory distress. She has no wheezes. She has no rales.  Abdominal: Soft. Bowel sounds are normal. She exhibits no distension and no mass. There is tenderness (LLQ). There is no rebound and no guarding.  Musculoskeletal: Normal range of motion.  Neurological: She is alert and oriented to person, place, and time.  Skin: Skin is warm and dry. No rash noted.  Nursing note and vitals reviewed.   ED Course  Procedures (including critical care time) Labs Review Labs Reviewed  CBC WITH DIFFERENTIAL/PLATELET - Abnormal; Notable for the following:    Hemoglobin 11.4 (*)    HCT 35.1 (*)    All other components within normal limits  BASIC METABOLIC PANEL - Abnormal; Notable for the following:    Glucose, Bld 113 (*)    All  other components within normal limits  URINALYSIS, ROUTINE W REFLEX MICROSCOPIC (NOT AT [facility])    Imaging Review No results found. I have personally reviewed and evaluated these images and lab results as part of my medical decision-making.   EKG Interpretation None      MDM   Final diagnoses:  Lower abdominal pain   Jin KAMILE CICCARELLO presents to ER for dysuria and urinary frequency x 1 week. On exam, patient with left lower quadrant tenderness.  UA with no abnormalities and all values wdl. Patient's case discussed with Dr. Rex Kras, will obtain CBC and BMP along with abdominal CT. Lab work reviewed and reassuring.  2:15 PM - informed by nursing staff that patient is requesting to leave AMA. I have discussed my concerns as a provider and the possibility that this may worsen. We discussed the nature, risks and benefits, and alternatives to treatment. I have specifically discussed that without further evaluation I cannot guarantee there is not a life threatening event occuring. I have made the patient aware that this is an [facility] discharge, but she may return at any time for further evaluation  and treatment.  [facility] Kynsleigh [place], PA-C 08/03/15 [place], MD 08/05/15 [phone]

## 2015-08-03 NOTE — ED Notes (Signed)
IV removed and pt is leaving AMA

## 2015-08-03 NOTE — ED Notes (Signed)
This RN asked encouraged pt to seek follow up care with PCP, advised if symptoms worsen to return to ED.

## 2015-08-03 NOTE — ED Notes (Signed)
Pt c/o burning with urination x 1 week.  

## 2015-08-03 NOTE — ED Notes (Signed)
PA at bedside.

## 2015-08-07 ENCOUNTER — Other Ambulatory Visit: Payer: Self-pay | Admitting: Orthopedic Surgery

## 2015-08-10 ENCOUNTER — Other Ambulatory Visit (HOSPITAL_COMMUNITY)
Admission: RE | Admit: 2015-08-10 | Discharge: 2015-08-10 | Disposition: A | Discharge status: Home / Self Care | Payer: Medicare Other | Source: Ambulatory Visit | Attending: Family Medicine | Admitting: Family Medicine

## 2015-08-10 ENCOUNTER — Encounter (HOSPITAL_COMMUNITY): Payer: Self-pay

## 2015-08-10 ENCOUNTER — Emergency Department (INDEPENDENT_AMBULATORY_CARE_PROVIDER_SITE_OTHER)
Admission: EM | Admit: 2015-08-10 | Discharge: 2015-08-10 | Disposition: A | Discharge status: Home / Self Care | Payer: Medicare Other | Source: Home / Self Care | Attending: Family Medicine | Admitting: Family Medicine

## 2015-08-10 DIAGNOSIS — R300 Dysuria: Secondary | ICD-10-CM

## 2015-08-10 LAB — POCT URINALYSIS DIP (DEVICE)
BILIRUBIN URINE: NEGATIVE
GLUCOSE, UA: NEGATIVE mg/dL
HGB URINE DIPSTICK: NEGATIVE
Ketones, ur: NEGATIVE mg/dL
LEUKOCYTES UA: NEGATIVE
Nitrite: NEGATIVE
Protein, ur: NEGATIVE mg/dL
SPECIFIC GRAVITY, URINE: 1.01 (ref 1.005–1.030)
UROBILINOGEN UA: 0.2 mg/dL (ref 0.0–1.0)
pH: 6 (ref 5.0–8.0)

## 2015-08-10 NOTE — ED Provider Notes (Signed)
CSN: [id number]     Arrival date & time 08/10/15  1305 History   First MD Initiated Contact with Patient 08/10/15 1317     Chief Complaint  Patient presents with  . Urinary Tract Infection   (Consider location/radiation/quality/duration/timing/severity/associated sxs/prior Treatment) HPI Comments: This is the second visit to [facility] for this 60 year old female with complaining of occasional pain or stinging with urination for one week. She hesitates to say whether she has frequency or not, she is not sure. Denies odor to the urine, denies gross hematuria. Denies fever or GI symptoms. Denies vaginal discharge. She was in the emergency department one week ago and her urinalysis was completely clear. A urinalysis in the urgent care today is completely clear. A culture was not performed at the initial visit.  Patient is a 60 y.o. female presenting with urinary tract infection.  Urinary Tract Infection Associated symptoms: no fever, no flank pain and no vaginal discharge     Past Medical History  Diagnosis Date  . Diabetes mellitus   . Hypertension   . Asthma   . Osteoarthritis of left knee 02/23/2012  . 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 Diverticulitis   . High cholesterol   . Lower back pain   . Headache   . Chronic pain   . Hard of hearing    Past Surgical History  Procedure Laterality Date  . Joint replacement      acl   bil knees  . Abdominal hysterectomy    . Total knee arthroplasty  02/23/2012    Procedure: TOTAL KNEE ARTHROPLASTY;  Surgeon: Johnny Bridge, MD;  Location: [place];  Service: Orthopedics;  Laterality: Left;  . Knee closed reduction  04/09/2012    Procedure: CLOSED MANIPULATION KNEE;  Surgeon: Johnny Bridge, MD;  Location: [place];  Service: Orthopedics;  Laterality: Left;  Manipulation Knee with Anesthesia includes Application of Traction   . Injection knee  04/09/2012    Procedure: KNEE INJECTION;  Surgeon:  Johnny Bridge, MD;  Location: [place];  Service: Orthopedics;  Laterality: Left;   Family History  Problem Relation Age of Onset  . Bronchitis Father 65  . Alzheimer's disease Mother   . Diabetes Daughter   . Diabetes Daughter   . Diabetes     Social History  Substance Use Topics  . Smoking status: Never Smoker   . Smokeless tobacco: Never Used  . Alcohol Use: No   OB History    No data available     Review of Systems  Constitutional: Negative for fever, chills, activity change and fatigue.  Respiratory: Negative.   Genitourinary: Positive for dysuria. Negative for urgency, hematuria, flank pain, decreased urine volume, vaginal bleeding, vaginal discharge, genital sores, vaginal pain and pelvic pain.  Musculoskeletal: Negative.   Skin: Negative.   Neurological: Negative.   Psychiatric/Behavioral: Negative.     Allergies  Review of patient's allergies indicates no known allergies.  Home Medications   Prior to Admission medications   Medication Sig Start Date End Date Taking? Authorizing Provider  albuterol (PROVENTIL HFA;VENTOLIN HFA) 108 (90 BASE) MCG/ACT inhaler Inhale 2 puffs into the lungs every 4 (four) hours as needed. For wheezing   Yes Historical Provider, MD  AMITIZA 24 MCG capsule Take 1 capsule (24 mcg total) by mouth 2 (two) times daily with a meal. 07/23/15  Yes Sienna [place], DO  aspirin-acetaminophen-caffeine (EXCEDRIN MIGRAINE) [id number] MG per tablet Take 2 tablets  by mouth daily.   Yes Historical Provider, MD  atorvastatin (LIPITOR) 40 MG tablet Take 1 tablet (40 mg total) by mouth daily. 01/05/15  Yes Gildardo Cranker, DO  azelastine (ASTELIN) 137 MCG/SPRAY nasal spray Place 1 spray into the nose 2 (two) times daily. Use in each nostril as directed   Yes Historical Provider, MD  loratadine (CLARITIN) 10 MG tablet Take 1 tablet (10 mg total) by mouth daily. 04/13/15  Yes Gildardo Cranker, DO  meloxicam (MOBIC) 15 MG tablet Take 15 mg by mouth  daily. Reported on 08/03/2015   Yes Historical Provider, MD  metFORMIN (GLUCOPHAGE) 500 MG tablet Take one tablet by mouth twice daily to control blood sugar 02/02/15  Yes Gildardo Cranker, DO  omeprazole (PRILOSEC) 40 MG capsule Take 1 capsule (40 mg total) by mouth daily. 04/13/15  Yes Gildardo Cranker, DO  Oxycodone HCl 10 MG TABS Take 10 mg by mouth 3 (three) times daily.   Yes Historical Provider, MD  quinapril (ACCUPRIL) 20 MG tablet TAKE 1 TABLET BY MOUTH AT BEDTIME 06/18/15  Yes Monica Carter, DO  TRADJENTA 5 MG TABS tablet TAKE 1 TABLET EVERY DAY 04/23/15  Yes Gildardo Cranker, DO  baclofen (LIORESAL) 10 MG tablet Take 10 mg by mouth. 1 in morning 2 at bed time    Historical Provider, MD  beclomethasone (QVAR) 80 MCG/ACT inhaler Inhale 2 puffs into the lungs 2 (two) times daily.    Historical Provider, MD  diclofenac sodium (VOLTAREN) 1 % GEL Apply 8 g topically 2 (two) times daily as needed. On knees    Historical Provider, MD  montelukast (SINGULAIR) 10 MG tablet Take 1 tablet (10 mg total) by mouth at bedtime. 04/12/15   Roselyn Malachy Moan, MD  montelukast (SINGULAIR) 10 MG tablet Take 1 tablet (10 mg total) by mouth at bedtime. 04/13/15   Roselyn Malachy Moan, MD  montelukast (SINGULAIR) 10 MG tablet Take 1 tablet (10 mg total) by mouth at bedtime. 04/13/15   Roselyn Malachy Moan, MD  ONE TOUCH ULTRA TEST test strip Check blood sugar once daily as directed 08/04/14   Historical Provider, MD   Meds Ordered and Administered this Visit  Medications - No data to display  BP 145/64 mmHg  Pulse 76  Temp(Src) 97.9 F (36.6 C) (Oral)  SpO2 99% No data found.   Physical Exam  Constitutional: She is oriented to person, place, and time. She appears well-developed and well-nourished. No distress.  Eyes: EOM are normal.  Neck: Normal range of motion. Neck supple.  Cardiovascular: Normal rate.   Pulmonary/Chest: Effort normal. No respiratory distress.  Musculoskeletal: She exhibits no edema.  Neurological: She  is alert and oriented to person, place, and time. She exhibits normal muscle tone.  Skin: Skin is warm and dry.  Psychiatric: She has a normal mood and affect.  Nursing note and vitals reviewed.   ED Course  Procedures (including critical care time)  Labs Review Labs Reviewed  URINE CULTURE  POCT URINALYSIS DIP (DEVICE)   Results for orders placed or performed during the hospital encounter of 08/10/15  POCT urinalysis dip (device)  Result Value Ref Range   Glucose, UA NEGATIVE NEGATIVE mg/dL   Bilirubin Urine NEGATIVE NEGATIVE   Ketones, ur NEGATIVE NEGATIVE mg/dL   Specific Gravity, Urine 1.010 1.005 - 1.030   Hgb urine dipstick NEGATIVE NEGATIVE   pH 6.0 5.0 - 8.0   Protein, ur NEGATIVE NEGATIVE mg/dL   Urobilinogen, UA 0.2 0.0 - 1.0 mg/dL   Nitrite NEGATIVE  NEGATIVE   Leukocytes, UA NEGATIVE NEGATIVE     Imaging Review No results found.   Visual Acuity Review  Right Eye Distance:   Left Eye Distance:   Bilateral Distance:    Right Eye Near:   Left Eye Near:    Bilateral Near:         MDM   1. Dysuria    Urine culture pending. AZO Standard 1 to 2 tabs 3 times a day Urodynamic Testing Patient has had 2 clear and normal urinalysis within 7 days. Her only complaint is that of stinging with urination. Frequency is an equivocal symptom. No other associated symptoms related to UTI or pyelonephritis. The culture urine. The patient is requested to call her PCP today and make an appointment for early next week. If the culture is positive history as indicated. If there is nothing growing in the culture is recommended that she follow up with her PCP and possibly be referred to urology if necessary to determine the etiology of the dysuria.Janne Napoleon, NP 08/10/15 1435

## 2015-08-10 NOTE — ED Notes (Signed)
59 y.o/female presents with possible UTI Pressure and a sting when she urinates No acute distress

## 2015-08-10 NOTE — Discharge Instructions (Signed)
Dysuria AZO Standard 1 to 2 tabs 3 times a day Urodynamic Testing WHAT IS URODYNAMIC TESTING? Urodynamic tests are done to determine how well your lower urinary tract is working. The lower urinary tract includes your bladder and the tube that empties your bladder (urethra).  When your kidneys filter your blood, urine is stored in your bladder until you feel the urge to pass urine (urinate). Urination requires coordination between the nerves and muscles of your bladder and urethra. When your lower urinary tract is working well, you should be able to:   Start urinating when your bladder is full.  Empty your bladder completely.  Control the flow of your urine. WHY DO I NEED URODYNAMIC TESTING? You may need urodynamic testing if you:  Are leaking urine (incontinence).  Have problems starting or stopping your urine flow.  Have frequent or painful urination.  Have frequent urinary tract infections.  Cannot empty your bladder completely.  Have strong urges to pass urine (urgency).  Have a weak flow of urine. HOW IS URODYNAMIC TESTING DONE? Urodynamic tests may be done separately or all during one testing visit. These tests may be done at your health care provider's office, a clinic, or a hospital. You may be given an antibiotic medicine before or after testing to prevent infection. Ask your health care provider if you should:  Stop taking any of your regular medicines.  Arrive for the test with a full bladder. The urodynamic tests you may have done include: Uroflowmetry This test measures how much urine you pass and how long it takes to pass.  You sit on a special toilet to urinate.  The toilet measures the volume and the time of your urine flow.  These measurements are sent to a computer that creates a graph of your urine flow. Postvoid residual measurement This test measures how much urine is left in your bladder after you urinate.  It uses sound waves (ultrasound) to create  an image of your bladder.  The test can also be done by inserting a thin, flexible tube (catheter) into your bladder after you urinate.  Remaining urine is measured in milliliters (mL). If you have more than 100 mL left in your bladder after you urinate, your bladder is not emptying as it should. Cystometric testing This test uses a special bladder catheter that can measure pressure.   A numbing medicine (local anesthetic) may be used.  First, a normal catheter is used to empty your bladder completely.  Then the measuring catheter is placed, and your bladder is filled with warm, germ-free (sterile) water.  Pressure measurements will be taken:  As your bladder fills.  When you feel the need to urinate.  As your bladder is emptied.  You may be asked to cough or bear down to check for leakage.  In some cases, your bladder may be filled with a material that shows up on X-rays (contrast material) so that X-ray pictures can be taken during the test. Electromyography This test measures the electrical activity of the nerves and muscles of your bladder and the opening of your urethra.   It tells how well your nerves are communicating with your muscles.  Sticky patches are placed near your rectum and urethra to measure electrical activity. WHAT ARE THE RISKS OF THIS TESTING? Generally, these tests are safe. However, problems can occur and include:  Discomfort.  Frequent urge to urinate.  Bleeding.  Infection.  An allergic reaction to contrast material, if contrast material is used. WHAT  HAPPENS AFTER THE TESTING?   You should be able to go home right away and do your usual activities.  You may be instructed to drink a tall glass of water every 30 minutes for the first 2 hours you are home.  Taking a warm bath or using a warm compress may relieve any discomfort near your urethra. Let your health care provider know if you have:  Pain.  Blood in your  urine.  Chills.  Fever. WHAT DO MY TEST RESULTS MEAN? Discuss the results of your urodynamic tests with your health care provider. Your health care provider will use the results of these and other tests, along with your signs and symptoms, to make a diagnosis. Some common causes for abnormal results from urodynamic tests include:  Enlarged prostate in men.  Overactive bladder.  Urinary tract infection.  Nervous system diseases.  Spinal cord damage.   This information is not intended to replace advice given to you by your health care provider. Make sure you discuss any questions you have with your health care provider.   Document Released: 03/09/2007 Document Revised: 06/02/2014 Document Reviewed: 08/22/2013 Elsevier Interactive Patient Education 2016 Elsevier Inc. Dysuria is pain or discomfort while urinating. The pain or discomfort may be felt in the tube that carries urine out of the bladder (urethra) or in the surrounding tissue of the genitals. The pain may also be felt in the groin area, lower abdomen, and lower back. You may have to urinate frequently or have the sudden feeling that you have to urinate (urgency). Dysuria can affect both men and women, but is more common in women. Dysuria can be caused by many different things, including:  Urinary tract infection in women.  Infection of the kidney or bladder.  Kidney stones or bladder stones.  Certain sexually transmitted infections (STIs), such as chlamydia.  Dehydration.  Inflammation of the vagina.  Use of certain medicines.  Use of certain soaps or scented products that cause irritation. HOME CARE INSTRUCTIONS Watch your dysuria for any changes. The following actions may help to reduce any discomfort you are feeling:  Drink enough fluid to keep your urine clear or pale yellow.  Empty your bladder often. Avoid holding urine for long periods of time.  After a bowel movement or urination, women should cleanse  from front to back, using each tissue only once.  Empty your bladder after sexual intercourse.  Take medicines only as directed by your health care provider.  If you were prescribed an antibiotic medicine, finish it all even if you start to feel better.  Avoid caffeine, tea, and alcohol. They can irritate the bladder and make dysuria worse. In men, alcohol may irritate the prostate.  Keep all follow-up visits as directed by your health care provider. This is important.  If you had any tests done to find the cause of dysuria, it is your responsibility to obtain your test results. Ask the lab or department performing the test when and how you will get your results. Talk with your health care provider if you have any questions about your results. SEEK MEDICAL CARE IF:  You develop pain in your back or sides.  You have a fever.  You have nausea or vomiting.  You have blood in your urine.  You are not urinating as often as you usually do. SEEK IMMEDIATE MEDICAL CARE IF:  You pain is severe and not relieved with medicines.  You are unable to hold down any fluids.  You or someone  else notices a change in your mental function.  You have a rapid heartbeat at rest.  You have shaking or chills.  You feel extremely weak.   This information is not intended to replace advice given to you by your health care provider. Make sure you discuss any questions you have with your health care provider.   Document Released: 02/08/2004 Document Revised: 06/02/2014 Document Reviewed: 01/05/2014 Elsevier Interactive Patient Education [employer].

## 2015-08-11 ENCOUNTER — Other Ambulatory Visit: Payer: Self-pay | Admitting: Allergy and Immunology

## 2015-08-13 LAB — URINE CULTURE: Culture: >=100000

## 2015-08-13 MED ORDER — CEPHALEXIN 500 MG PO CAPS: 500.0000 mg | ORAL_CAPSULE | Freq: Four times a day (QID) | ORAL | Status: DC

## 2015-08-15 ENCOUNTER — Encounter (HOSPITAL_COMMUNITY): Payer: Self-pay

## 2015-08-15 ENCOUNTER — Other Ambulatory Visit: Payer: Self-pay

## 2015-08-15 ENCOUNTER — Encounter (HOSPITAL_COMMUNITY)
Admission: RE | Admit: 2015-08-15 | Discharge: 2015-08-15 | Disposition: A | Discharge status: Home / Self Care | Payer: Medicare Other | Source: Ambulatory Visit | Attending: Orthopedic Surgery | Admitting: Orthopedic Surgery

## 2015-08-15 DIAGNOSIS — Z01818 Encounter for other preprocedural examination: Secondary | ICD-10-CM | POA: Insufficient documentation

## 2015-08-15 DIAGNOSIS — Z01812 Encounter for preprocedural laboratory examination: Secondary | ICD-10-CM | POA: Diagnosis not present

## 2015-08-15 DIAGNOSIS — M24662 Ankylosis, left knee: Secondary | ICD-10-CM | POA: Diagnosis not present

## 2015-08-15 HISTORY — DX: Other chronic pain: G89.29

## 2015-08-15 HISTORY — DX: Hyperlipidemia, unspecified: E78.5

## 2015-08-15 HISTORY — DX: Other seasonal allergic rhinitis: J30.2

## 2015-08-15 HISTORY — DX: Reserved for inherently not codable concepts without codable children: IMO0001

## 2015-08-15 HISTORY — DX: Cystitis, unspecified without hematuria: N30.90

## 2015-08-15 HISTORY — DX: Gastro-esophageal reflux disease without esophagitis: K21.9

## 2015-08-15 HISTORY — DX: Pain in unspecified joint: M25.50

## 2015-08-15 HISTORY — DX: Dorsalgia, unspecified: M54.9

## 2015-08-15 HISTORY — DX: Nocturia: R35.1

## 2015-08-15 HISTORY — DX: Effusion, unspecified joint: M25.40

## 2015-08-15 HISTORY — DX: Personal history of other medical treatment: Z92.89

## 2015-08-15 LAB — CBC
HEMATOCRIT: 35.8 % — AB (ref 36.0–46.0)
Hemoglobin: 11.1 g/dL — ABNORMAL LOW (ref 12.0–15.0)
MCH: 25 pg — ABNORMAL LOW (ref 26.0–34.0)
MCHC: 31 g/dL (ref 30.0–36.0)
MCV: 80.6 fL (ref 78.0–100.0)
Platelets: 272 10*3/uL (ref 150–400)
RBC: 4.44 MIL/uL (ref 3.87–5.11)
RDW: 13.8 % (ref 11.5–15.5)
WBC: 7.3 10*3/uL (ref 4.0–10.5)

## 2015-08-15 LAB — BASIC METABOLIC PANEL
ANION GAP: 10 (ref 5–15)
BUN: 8 mg/dL (ref 6–20)
CALCIUM: 10 mg/dL (ref 8.9–10.3)
CO2: 27 mmol/L (ref 22–32)
Chloride: 106 mmol/L (ref 101–111)
Creatinine, Ser: 0.8 mg/dL (ref 0.44–1.00)
GFR calc Af Amer: >60 mL/min (ref >60)
GFR calc non Af Amer: >60 mL/min (ref >60)
GLUCOSE: 117 mg/dL — AB (ref 65–99)
POTASSIUM: 3.8 mmol/L (ref 3.5–5.1)
Sodium: 143 mmol/L (ref 135–145)

## 2015-08-15 LAB — GLUCOSE, CAPILLARY: Glucose-Capillary: 103 mg/dL — ABNORMAL HIGH (ref 65–99)

## 2015-08-15 MED ORDER — CEFAZOLIN SODIUM-DEXTROSE 2-4 GM/100ML-% IV SOLN: 2.0000 g | INTRAVENOUS | Status: AC

## 2015-08-15 MED ORDER — CHLORHEXIDINE GLUCONATE 4 % EX LIQD: 60.0000 mL | Freq: Once | CUTANEOUS | Status: DC

## 2015-08-15 MED ORDER — TRANEXAMIC ACID 1000 MG/10ML IV SOLN
1000.0000 mg | INTRAVENOUS | Status: AC
  Filled 2015-08-15: qty 10

## 2015-08-15 NOTE — Progress Notes (Addendum)
Cardiologist denies   Medical Md is Dr.Monica Milus Mallick denies  Stress test denies  Heart cath denies  EKG denies in past yr  CXR denies in past yr  Sleep study denies ever having one

## 2015-08-15 NOTE — Progress Notes (Signed)
Spoke with Tabitha Oneal at Dr.Dean's office,notified of Bladder Infection diagnosed 08/14/15 and being treated with Keflex 500 mg  QID.States she will let Dr.Dean know.

## 2015-08-15 NOTE — Pre-Procedure Instructions (Signed)
Tabitha Oneal  08/15/2015      [id number] [address], [place] [address] [phone] [phone]    Your procedure is scheduled on Thurs, Mar 23 @ 12:30 PM  Report to [facility] Admitting at 10:30 AM  Call this number if you have problems the morning of surgery:  [phone]   Remember:  Do not eat food or drink liquids after midnight.  Take these medicines the morning of surgery with A SIP OF WATER Albuterol<Bring Your Inhaler With You>,Amitza,Astelin Nasal Spray(if needed),Baclofen(Lioresal),QVAR,Claritin(Loratadine),Omeprazole(Prilosec),and Pain Pill(if needed)              No Goody's,BC's,Aleve,Aspirin,Advil,Motrin,Ibuprofen,Fish Oil,or any Herbal Medications.               How to Manage Your Diabetes Before Surgery   Why is it important to control my blood sugar before and after surgery?   Improving blood sugar levels before and after surgery helps healing and can limit problems.  A way of improving blood sugar control is eating a healthy diet by:  - Eating less sugar and carbohydrates  - Increasing activity/exercise  - Talk with your doctor about reaching your blood sugar goals  High blood sugars (greater than 180 mg/dL) can raise your risk of infections and slow down your recovery so you will need to focus on controlling your diabetes during the weeks before surgery.  Make sure that the doctor who takes care of your diabetes knows about your planned surgery including the date and location.  How do I manage my blood sugars before surgery?   Check your blood sugar at least 4 times a day, 2 days before surgery to make sure that they are not too high or low.   Check your blood sugar the morning of your surgery when you wake up and every 2               hours until you get to the Short-Stay unit.  If your blood sugar is less than 70 mg/dL, you will need to treat for low blood  sugar by:  Treat a low blood sugar (less than 70 mg/dL) with 1/2 cup of clear juice (cranberry or apple), 4 glucose tablets, OR glucose gel.  Recheck blood sugar in 15 minutes after treatment (to make sure it is greater than 70 mg/dL).  If blood sugar is not greater than 70 mg/dL on re-check, call [phone] for further instructions.   Report your blood sugar to the Short-Stay nurse when you get to Short-Stay.  References:  [facility], 2007 "How to Manage your Diabetes Before and After Surgery".  What do I do about my diabetes medications?   Do not take oral diabetes medicines (pills) the morning of surgery.        Do not take other diabetes injectables the day of surgery including Byetta, Victoza, Bydureon, and Trulicity.    If your CBG is greater than 220 mg/dL, you may take 1/2 of your sliding scale (correction) dose of insulin.   For patients with "Insulin Pumps":  Contact your diabetes doctor for specific instructions before surgery.   Decrease basal insulin rates by 20% at midnight the night before surgery.  Note that if your surgery is planned to be longer than 2 hours, your insulin pump will be removed and intravenous (IV) insulin will be started and managed by the nurses and anesthesiologist.  You will be able  to restart your insulin pump once you are awake and able to manage it.  Make sure to bring insulin pump supplies to the hospital with you in case your site needs to be changed.         Do not wear jewelry, make-up or nail polish.  Do not wear lotions, powders, or perfumes.  You may wear deodorant.  Do not shave 48 hours prior to surgery.  Men may shave face and neck.  Do not bring valuables to the hospital.  [facility] is not responsible for any belongings or valuables.  Contacts, dentures or bridgework may not be worn into surgery.  Leave your suitcase in the car.  After surgery it may be brought to your room.  For  patients admitted to the hospital, discharge time will be determined by your treatment team.  Patients discharged the day of surgery will not be allowed to drive home.    Special instructions:  [facility] - Preparing for Surgery  Before surgery, you can play an important role.  Because skin is not sterile, your skin needs to be as free of germs as possible.  You can reduce the number of germs on you skin by washing with CHG (chlorahexidine gluconate) soap before surgery.  CHG is an antiseptic cleaner which kills germs and bonds with the skin to continue killing germs even after washing.  Please DO NOT use if you have an allergy to CHG or antibacterial soaps.  If your skin becomes reddened/irritated stop using the CHG and inform your nurse when you arrive at Short Stay.  Do not shave (including legs and underarms) for at least 48 hours prior to the [facility] CHG shower.  You may shave your face.  Please follow these instructions carefully:   1.  Shower with CHG Soap the night before surgery and the                                morning of Surgery.  2.  If you choose to wash your hair, wash your hair [facility] as usual with your       normal shampoo.  3.  After you shampoo, rinse your hair and body thoroughly to remove the                      Shampoo.  4.  Use CHG as you would any other liquid soap.  You can apply chg directly       to the skin and wash gently with scrungie or a clean washcloth.  5.  Apply the CHG Soap to your body ONLY FROM THE NECK DOWN.        Do not use on open wounds or open sores.  Avoid contact with your eyes,       ears, mouth and genitals (private parts).  Wash genitals (private parts)       with your normal soap.  6.  Wash thoroughly, paying special attention to the area where your surgery        will be performed.  7.  Thoroughly rinse your body with warm water from the neck down.  8.  DO NOT shower/wash with your normal soap after using and rinsing off       the CHG  Soap.  9.  Pat yourself dry with a clean towel.            10.  Wear clean pajamas.            11.  Place clean sheets on your bed the night of your [facility] shower and do not        sleep with pets.  Day of Surgery  Do not apply any lotions/deoderants the morning of surgery.  Please wear clean clothes to the hospital/surgery center.    Please read over the following fact sheets that you were given. Pain Booklet, Coughing and Deep Breathing and Surgical Site Infection Prevention

## 2015-08-20 ENCOUNTER — Other Ambulatory Visit: Payer: Self-pay | Admitting: Internal Medicine

## 2015-08-20 DIAGNOSIS — M25561 Pain in right knee: Secondary | ICD-10-CM | POA: Diagnosis not present

## 2015-08-20 DIAGNOSIS — G541 Lumbosacral plexus disorders: Secondary | ICD-10-CM | POA: Diagnosis not present

## 2015-08-20 DIAGNOSIS — Z79891 Long term (current) use of opiate analgesic: Secondary | ICD-10-CM | POA: Diagnosis not present

## 2015-08-20 DIAGNOSIS — G603 Idiopathic progressive neuropathy: Secondary | ICD-10-CM | POA: Diagnosis not present

## 2015-08-20 DIAGNOSIS — M25562 Pain in left knee: Secondary | ICD-10-CM | POA: Diagnosis not present

## 2015-08-20 DIAGNOSIS — G894 Chronic pain syndrome: Secondary | ICD-10-CM | POA: Diagnosis not present

## 2015-08-22 ENCOUNTER — Encounter: Payer: Self-pay | Admitting: Internal Medicine

## 2015-08-22 ENCOUNTER — Ambulatory Visit (INDEPENDENT_AMBULATORY_CARE_PROVIDER_SITE_OTHER): Payer: Medicare Other | Admitting: Internal Medicine

## 2015-08-22 VITALS — BP 152/88 | HR 68 | Temp 98.3°F | Ht 63.0 in | Wt 203.0 lb

## 2015-08-22 DIAGNOSIS — E785 Hyperlipidemia, unspecified: Secondary | ICD-10-CM

## 2015-08-22 DIAGNOSIS — R300 Dysuria: Secondary | ICD-10-CM

## 2015-08-22 DIAGNOSIS — I10 Essential (primary) hypertension: Secondary | ICD-10-CM

## 2015-08-22 DIAGNOSIS — E119 Type 2 diabetes mellitus without complications: Secondary | ICD-10-CM | POA: Diagnosis not present

## 2015-08-22 NOTE — Progress Notes (Signed)
Patient ID: Tabitha Oneal, female   DOB: 02-26-1956, 60 y.o.   MRN: [id number]   [facility] clinic  Provider: Jeanmarie Hubert, M.D.  Code Status: ull Goals of Care:  Advanced Directives 08/15/2015  Does patient have an advance directive? No  Would patient like information on creating an advanced directive? No - patient declined information     Chief Complaint  Patient presents with  . Acute Visit    Complains of UTI. Burining and pain with urination. Finished Cephalexin 2 weeks ago.     HPI: Patient is a 60 y.o. female seen today for an acute visit for follow up of UTI. Took cephalexin and is now feeling much better.  Past Medical History  Diagnosis Date  . Osteoarthritis of left knee 02/23/2012  . Hard of hearing   . Asthma     QVAR daily and Albuterol as needed  . GERD (gastroesophageal reflux disease)     takes Omeprazole daily  . Seasonal allergies     takes Claritin and Singulair daily.Nasal spray as needed  . Hyperlipidemia     takes Atorvastatin daily  . Diabetes mellitus     takes Metformin and Tradjenta daily  . Hypertension     takes Quinapril daily  . Bladder infection     taking Keflex daily   . Shortness of breath dyspnea     with exertion  . Headache     daily.Takes Excedrine daily  . Joint pain   . Joint swelling   . Chronic back pain     reason unknown  . Nocturia   . History of blood transfusion     no abnormal reaction noted    Past Surgical History  Procedure Laterality Date  . Joint replacement      acl   bil knees  . Abdominal hysterectomy    . Total knee arthroplasty  02/23/2012    Procedure: TOTAL KNEE ARTHROPLASTY;  Surgeon: Johnny Bridge, MD;  Location: [place];  Service: Orthopedics;  Laterality: Left;  . Knee closed reduction  04/09/2012    Procedure: CLOSED MANIPULATION KNEE;  Surgeon: Johnny Bridge, MD;  Location: [place];  Service: Orthopedics;  Laterality: Left;  Manipulation Knee with Anesthesia includes  Application of Traction   . Injection knee  04/09/2012    Procedure: KNEE INJECTION;  Surgeon: Johnny Bridge, MD;  Location: [place];  Service: Orthopedics;  Laterality: Left;  . Colonoscopy      No Known Allergies    Medication List       This list is accurate as of: 08/22/15  4:16 PM.  Always use your most recent med list.               albuterol 108 (90 Base) MCG/ACT inhaler  Commonly known as:  PROVENTIL HFA;VENTOLIN HFA  Inhale 2 puffs into the lungs every 4 (four) hours as needed. For wheezing     AMITIZA 24 MCG capsule  Generic drug:  lubiprostone  Take 1 capsule (24 mcg total) by mouth 2 (two) times daily with a meal.     aspirin-acetaminophen-caffeine 250-250-65 MG tablet  Commonly known as:  EXCEDRIN MIGRAINE  Take 2 tablets by mouth daily.     atorvastatin 40 MG tablet  Commonly known as:  LIPITOR  Take 1 tablet (40 mg total) by mouth daily.     azelastine 0.1 % nasal spray  Commonly known as:  ASTELIN  Place 1 spray into the nose  2 (two) times daily as needed for allergies. Use in each nostril as directed     baclofen 10 MG tablet  Commonly known as:  LIORESAL  Take 10 mg by mouth. 1 in morning 2 at bed time     beclomethasone 80 MCG/ACT inhaler  Commonly known as:  QVAR  Inhale 2 puffs into the lungs 2 (two) times daily.     cephALEXin 500 MG capsule  Commonly known as:  KEFLEX  Take 1 capsule (500 mg total) by mouth 4 (four) times daily.     diclofenac sodium 1 % Gel  Commonly known as:  VOLTAREN  Apply 8 g topically 2 (two) times daily as needed. On knees     loratadine 10 MG tablet  Commonly known as:  CLARITIN  Take 1 tablet (10 mg total) by mouth daily.     metFORMIN 500 MG tablet  Commonly known as:  GLUCOPHAGE  Take one tablet by mouth twice daily to control blood sugar     montelukast 10 MG tablet  Commonly known as:  SINGULAIR  TAKE 1 TABLET BY MOUTH AT BEDTIME     montelukast 10 MG tablet  Commonly known as:   SINGULAIR  TAKE 1 TABLET (10 MG TOTAL) BY MOUTH AT BEDTIME.     omeprazole 40 MG capsule  Commonly known as:  PRILOSEC  Take 1 capsule (40 mg total) by mouth daily.     ONE TOUCH ULTRA TEST test strip  Generic drug:  glucose blood  Check blood sugar once daily as directed     Oxycodone HCl 10 MG Tabs  Take 10 mg by mouth 3 (three) times daily.     quinapril 20 MG tablet  Commonly known as:  ACCUPRIL  TAKE 1 TABLET BY MOUTH AT BEDTIME     TRADJENTA 5 MG Tabs tablet  Generic drug:  linagliptin  TAKE 1 TABLET EVERY DAY        Review of Systems:  Review of Systems  Unable to perform ROS: Other  Constitutional: Positive for fatigue. Negative for fever, chills, diaphoresis, activity change and appetite change.  HENT: Negative for ear pain and sore throat.   Eyes: Negative for visual disturbance.  Respiratory: Negative for cough, chest tightness and shortness of breath.   Cardiovascular: Negative for chest pain, palpitations and leg swelling.  Gastrointestinal: Negative for nausea, vomiting, abdominal pain, diarrhea, constipation and blood in stool.  Genitourinary: Negative for dysuria.  Musculoskeletal: Positive for myalgias, arthralgias and gait problem.  Neurological: Negative for dizziness, tremors, numbness and headaches.  Psychiatric/Behavioral: Negative for sleep disturbance. The patient is not nervous/anxious.     Health Maintenance  Topic Date Due  . Hepatitis C Screening  1956-05-06  . PNEUMOCOCCAL POLYSACCHARIDE VACCINE (1) 12/17/1956  . HIV Screening  12/17/1969  . TETANUS/TDAP  12/17/1973  . COLONOSCOPY  12/17/2004  . ZOSTAVAX  12/18/2014  . OPHTHALMOLOGY EXAM  09/04/2015  . FOOT EXAM  10/06/2015  . HEMOGLOBIN A1C  10/09/2015  . INFLUENZA VACCINE  12/25/2015  . MAMMOGRAM  11/09/2016  . PAP SMEAR  10/05/2017    Physical Exam: Filed Vitals:   08/22/15 1516  BP: 152/88  Pulse: 68  Temp: 98.3 F (36.8 C)  TempSrc: Oral  Height: 5\' 3"  (1.6 m)  Weight:  203 lb (92.08 kg)   Body mass index is 35.97 kg/(m^2). Physical Exam  Constitutional: She is oriented to person, place, and time. She appears well-developed and well-nourished.  Looks uncomfortable in NAD  HENT:  Mouth/Throat: Oropharynx is clear and moist. No oropharyngeal exudate.  Eyes: Pupils are equal, round, and reactive to light. No scleral icterus.  Neck: Neck supple. Carotid bruit is not present. No tracheal deviation present. No thyromegaly present.  Cardiovascular: Normal rate, regular rhythm, normal heart sounds and intact distal pulses.  Exam reveals no gallop and no friction rub.   No murmur heard. No LE edema b/l. no calf TTP.   Pulmonary/Chest: Effort normal and breath sounds normal. No stridor. No respiratory distress. She has no wheezes. She has no rales.  Abdominal: Soft. Bowel sounds are normal. She exhibits no distension and no mass. There is no hepatomegaly. There is no tenderness. There is no rebound and no guarding.  Musculoskeletal: She exhibits edema and tenderness.  L>R knee swelling with increased warmth to touch. Left knee immobilizer intact; uses cane to ambulate; gait antalgic  Lymphadenopathy:    She has no cervical adenopathy.  Neurological: She is alert and oriented to person, place, and time. She has normal reflexes.  Skin: Skin is warm and dry. No rash noted.  Psychiatric: She has a normal mood and affect. Her behavior is normal. Judgment and thought content normal. Her speech is slurred.    Labs reviewed: Basic Metabolic Panel:  Recent Labs  08/30/14 1001  04/11/15 0912 08/03/15 1232 08/15/15 1321  NA 140  < > 143 141 143  K 4.0  < > 4.0 4.0 3.8  CL 99  < > 102 107 106  CO2 27  < > 27 26 27   GLUCOSE 86  < > 108* 113* 117*  BUN 8  < > 7* 7 8  CREATININE 0.71  < > 0.70 0.77 0.80  CALCIUM 9.9  < > 9.7 9.8 10.0  TSH 0.624  --   --   --   --   < > = values in this interval not displayed. Liver Function Tests:  Recent Labs  08/30/14 1001  01/03/15 0811 04/11/15 0912  AST 16  --   --   ALT 18 20 13   ALKPHOS 145*  --   --   BILITOT 0.3  --   --   PROT 7.7  --   --   ALBUMIN 4.6  --   --    No results for input(s): LIPASE, AMYLASE in the last 8760 hours. No results for input(s): AMMONIA in the last 8760 hours. CBC:  Recent Labs  08/30/14 1001 08/03/15 1232 08/15/15 1321  WBC 7.3 5.8 7.3  NEUTROABS 4.9 3.7  --   HGB 11.7 11.4* 11.1*  HCT 36.3 35.1* 35.8*  MCV 78* 80.5 80.6  PLT 314 289 272   Lipid Panel:  Recent Labs  08/30/14 1001 01/03/15 0811 04/11/15 0912  CHOL 156 130 129  HDL 65 57 50  LDLCALC 76 61 64  TRIG 75 61 77  CHOLHDL 2.4 2.3 2.6   Lab Results  Component Value Date   HGBA1C 6.5* 04/11/2015    Procedures since last visit: Nm Bone Scan 3 Phase  07/26/2015  CLINICAL DATA:  Left knee pain and swelling. EXAM: NUCLEAR MEDICINE 3-PHASE BONE SCAN TECHNIQUE: Radionuclide angiographic images, immediate static blood pool images, and 3-hour delayed static images were obtained of the knees after intravenous injection of radiopharmaceutical. RADIOPHARMACEUTICALS:  25.2 mCi Tc-68m MDP COMPARISON:  None. FINDINGS: Vascular phase: On the flow phase portion of the exam there is no abnormal asymmetric increased uptake identified. Blood pool phase: Mild asymmetric increased radiotracer uptake surrounds the tibial and  femoral components of the left knee arthroplasty device. \ Delayed phase: There is increased radiotracer uptake surrounds both the femoral and tibial components of the left knee arthroplasty device. IMPRESSION: 1. No evidence for arthroplasty device loosening or infection. 2. Nonspecific, increased radiotracer uptake surrounds the femoral and tibial components of the left knee arthroplasty device on the blood pool and delayed phase images only. Electronically Signed   By: Kerby Moors M.D.   On: 07/26/2015 14:15    Assessment/Plan 1. Burning with urination resolved - POC Urinalysis Dipstick -  Urinalysis; Future  2. Type 2 diabetes mellitus without complication, without long-term current use of insulin (HCC) controlled - Microalbumin, urine; Future - Hemoglobin A1c; Future - Comprehensive metabolic panel; Future  3. Essential hypertension, benign controlled - Comprehensive metabolic panel; Future  4. Hyperlipidemia LDL goal <100 - Lipid panel; Future

## 2015-08-24 NOTE — Progress Notes (Signed)
Called pt and gave her time of arrival for surgery on 08/28/15. Instructed pt to arrive at 12:00 noon. Pt voiced understanding.

## 2015-08-28 ENCOUNTER — Encounter (HOSPITAL_COMMUNITY): Payer: Self-pay | Admitting: *Deleted

## 2015-08-28 ENCOUNTER — Encounter (HOSPITAL_COMMUNITY)
Admission: RE | Disposition: A | Discharge status: Home / Self Care | Payer: Self-pay | Source: Ambulatory Visit | Attending: Orthopedic Surgery

## 2015-08-28 ENCOUNTER — Inpatient Hospital Stay (HOSPITAL_COMMUNITY): Payer: Medicare Other | Admitting: Anesthesiology

## 2015-08-28 ENCOUNTER — Observation Stay (HOSPITAL_COMMUNITY)
Admission: RE | Admit: 2015-08-28 | Discharge: 2015-08-30 | Disposition: A | Discharge status: Home / Self Care | Payer: Medicare Other | Source: Ambulatory Visit | Attending: Orthopedic Surgery | Admitting: Orthopedic Surgery

## 2015-08-28 DIAGNOSIS — D649 Anemia, unspecified: Secondary | ICD-10-CM | POA: Insufficient documentation

## 2015-08-28 DIAGNOSIS — G8929 Other chronic pain: Secondary | ICD-10-CM | POA: Insufficient documentation

## 2015-08-28 DIAGNOSIS — M24662 Ankylosis, left knee: Principal | ICD-10-CM | POA: Insufficient documentation

## 2015-08-28 DIAGNOSIS — M24669 Ankylosis, unspecified knee: Secondary | ICD-10-CM | POA: Diagnosis present

## 2015-08-28 DIAGNOSIS — H9190 Unspecified hearing loss, unspecified ear: Secondary | ICD-10-CM | POA: Diagnosis not present

## 2015-08-28 DIAGNOSIS — K219 Gastro-esophageal reflux disease without esophagitis: Secondary | ICD-10-CM | POA: Insufficient documentation

## 2015-08-28 DIAGNOSIS — I10 Essential (primary) hypertension: Secondary | ICD-10-CM | POA: Insufficient documentation

## 2015-08-28 DIAGNOSIS — R0602 Shortness of breath: Secondary | ICD-10-CM | POA: Diagnosis not present

## 2015-08-28 DIAGNOSIS — E119 Type 2 diabetes mellitus without complications: Secondary | ICD-10-CM | POA: Diagnosis not present

## 2015-08-28 DIAGNOSIS — R351 Nocturia: Secondary | ICD-10-CM | POA: Diagnosis not present

## 2015-08-28 DIAGNOSIS — M549 Dorsalgia, unspecified: Secondary | ICD-10-CM | POA: Diagnosis not present

## 2015-08-28 DIAGNOSIS — Z96653 Presence of artificial knee joint, bilateral: Secondary | ICD-10-CM | POA: Insufficient documentation

## 2015-08-28 DIAGNOSIS — N3090 Cystitis, unspecified without hematuria: Secondary | ICD-10-CM | POA: Diagnosis not present

## 2015-08-28 DIAGNOSIS — Z7982 Long term (current) use of aspirin: Secondary | ICD-10-CM | POA: Insufficient documentation

## 2015-08-28 DIAGNOSIS — R51 Headache: Secondary | ICD-10-CM | POA: Insufficient documentation

## 2015-08-28 DIAGNOSIS — Z820 Family history of epilepsy and other diseases of the nervous system: Secondary | ICD-10-CM | POA: Diagnosis not present

## 2015-08-28 DIAGNOSIS — E785 Hyperlipidemia, unspecified: Secondary | ICD-10-CM | POA: Diagnosis not present

## 2015-08-28 DIAGNOSIS — Z825 Family history of asthma and other chronic lower respiratory diseases: Secondary | ICD-10-CM | POA: Diagnosis not present

## 2015-08-28 DIAGNOSIS — Z7951 Long term (current) use of inhaled steroids: Secondary | ICD-10-CM | POA: Insufficient documentation

## 2015-08-28 DIAGNOSIS — Z79891 Long term (current) use of opiate analgesic: Secondary | ICD-10-CM | POA: Insufficient documentation

## 2015-08-28 DIAGNOSIS — Z833 Family history of diabetes mellitus: Secondary | ICD-10-CM | POA: Diagnosis not present

## 2015-08-28 DIAGNOSIS — J45909 Unspecified asthma, uncomplicated: Secondary | ICD-10-CM | POA: Diagnosis not present

## 2015-08-28 DIAGNOSIS — Z7984 Long term (current) use of oral hypoglycemic drugs: Secondary | ICD-10-CM | POA: Insufficient documentation

## 2015-08-28 DIAGNOSIS — Z79899 Other long term (current) drug therapy: Secondary | ICD-10-CM | POA: Insufficient documentation

## 2015-08-28 DIAGNOSIS — Z96652 Presence of left artificial knee joint: Secondary | ICD-10-CM | POA: Diagnosis not present

## 2015-08-28 HISTORY — PX: KNEE ARTHROTOMY: SHX5881

## 2015-08-28 LAB — GLUCOSE, CAPILLARY
GLUCOSE-CAPILLARY: 110 mg/dL — AB (ref 65–99)
Glucose-Capillary: 91 mg/dL (ref 65–99)

## 2015-08-28 SURGERY — ARTHROTOMY, KNEE
Anesthesia: Monitor Anesthesia Care | Site: Knee | Laterality: Left

## 2015-08-28 MED ORDER — METOCLOPRAMIDE HCL 10 MG PO TABS: 5.0000 mg | ORAL_TABLET | Freq: Three times a day (TID) | ORAL | Status: DC | PRN

## 2015-08-28 MED ORDER — ONDANSETRON HCL 4 MG/2ML IJ SOLN: 4.0000 mg | Freq: Once | INTRAMUSCULAR | Status: DC | PRN

## 2015-08-28 MED ORDER — 0.9 % SODIUM CHLORIDE (POUR BTL) OPTIME
TOPICAL | Status: DC | PRN
  Administered 2015-08-28: 1000 mL

## 2015-08-28 MED ORDER — ASPIRIN EC 325 MG PO TBEC
325.0000 mg | DELAYED_RELEASE_TABLET | Freq: Every day | ORAL | Status: DC
  Administered 2015-08-29 – 2015-08-30 (×2): 325 mg via ORAL
  Filled 2015-08-28 (×2): qty 1

## 2015-08-28 MED ORDER — ONDANSETRON HCL 4 MG/2ML IJ SOLN
INTRAMUSCULAR | Status: DC | PRN
  Administered 2015-08-28: 4 mg via INTRAVENOUS

## 2015-08-28 MED ORDER — CEFAZOLIN SODIUM-DEXTROSE 2-4 GM/100ML-% IV SOLN
INTRAVENOUS | Status: AC
  Administered 2015-08-28: 2 g via INTRAVENOUS
  Filled 2015-08-28: qty 100

## 2015-08-28 MED ORDER — CLONIDINE HCL (ANALGESIA) 100 MCG/ML EP SOLN
EPIDURAL | Status: DC | PRN
  Administered 2015-08-28: 1 mL via INTRA_ARTICULAR

## 2015-08-28 MED ORDER — METFORMIN HCL 500 MG PO TABS
500.0000 mg | ORAL_TABLET | Freq: Every day | ORAL | Status: DC
  Administered 2015-08-29 – 2015-08-30 (×2): 500 mg via ORAL
  Filled 2015-08-28 (×2): qty 1

## 2015-08-28 MED ORDER — MIDAZOLAM HCL 5 MG/5ML IJ SOLN
INTRAMUSCULAR | Status: DC | PRN
  Administered 2015-08-28 (×2): 1 mg via INTRAVENOUS

## 2015-08-28 MED ORDER — PROPOFOL 10 MG/ML IV BOLUS
INTRAVENOUS | Status: AC
  Filled 2015-08-28: qty 20

## 2015-08-28 MED ORDER — MORPHINE SULFATE (PF) 4 MG/ML IV SOLN
INTRAVENOUS | Status: AC
  Filled 2015-08-28: qty 2

## 2015-08-28 MED ORDER — POTASSIUM CHLORIDE IN NACL 20-0.9 MEQ/L-% IV SOLN
INTRAVENOUS | Status: AC
  Administered 2015-08-28: 23:00:00 via INTRAVENOUS
  Filled 2015-08-28 (×2): qty 1000

## 2015-08-28 MED ORDER — LUBIPROSTONE 24 MCG PO CAPS
24.0000 ug | ORAL_CAPSULE | Freq: Two times a day (BID) | ORAL | Status: DC
Start: 2015-08-29 — End: 2015-08-30
  Administered 2015-08-29 – 2015-08-30 (×3): 24 ug via ORAL
  Filled 2015-08-28 (×3): qty 1

## 2015-08-28 MED ORDER — ACETAMINOPHEN 325 MG PO TABS
650.0000 mg | ORAL_TABLET | Freq: Four times a day (QID) | ORAL | Status: DC | PRN
  Administered 2015-08-29 – 2015-08-30 (×3): 650 mg via ORAL
  Filled 2015-08-28 (×3): qty 2

## 2015-08-28 MED ORDER — MORPHINE SULFATE (PF) 4 MG/ML IV SOLN
INTRAVENOUS | Status: DC | PRN
  Administered 2015-08-28: 8 mg

## 2015-08-28 MED ORDER — FENTANYL CITRATE (PF) 100 MCG/2ML IJ SOLN
25.0000 ug | INTRAMUSCULAR | Status: DC | PRN
  Administered 2015-08-28: 25 ug via INTRAVENOUS

## 2015-08-28 MED ORDER — PROPOFOL 500 MG/50ML IV EMUL
INTRAVENOUS | Status: DC | PRN
  Administered 2015-08-28: 50 ug/kg/min via INTRAVENOUS

## 2015-08-28 MED ORDER — PANTOPRAZOLE SODIUM 40 MG PO TBEC
40.0000 mg | DELAYED_RELEASE_TABLET | Freq: Every day | ORAL | Status: DC
  Administered 2015-08-29 – 2015-08-30 (×2): 40 mg via ORAL
  Filled 2015-08-28 (×2): qty 1

## 2015-08-28 MED ORDER — MIDAZOLAM HCL 2 MG/2ML IJ SOLN
INTRAMUSCULAR | Status: AC
  Filled 2015-08-28: qty 2

## 2015-08-28 MED ORDER — BUPIVACAINE-EPINEPHRINE (PF) 0.25% -1:200000 IJ SOLN
INTRAMUSCULAR | Status: DC | PRN
  Administered 2015-08-28: 10 mL

## 2015-08-28 MED ORDER — ATORVASTATIN CALCIUM 40 MG PO TABS
40.0000 mg | ORAL_TABLET | Freq: Every day | ORAL | Status: DC
  Administered 2015-08-28 – 2015-08-30 (×3): 40 mg via ORAL
  Filled 2015-08-28 (×3): qty 1

## 2015-08-28 MED ORDER — GLYCOPYRROLATE 0.2 MG/ML IJ SOLN
INTRAMUSCULAR | Status: AC
  Filled 2015-08-28: qty 1

## 2015-08-28 MED ORDER — BUDESONIDE 0.25 MG/2ML IN SUSP
0.2500 mg | Freq: Two times a day (BID) | RESPIRATORY_TRACT | Status: DC
  Administered 2015-08-29 – 2015-08-30 (×3): 0.25 mg via RESPIRATORY_TRACT
  Filled 2015-08-28 (×3): qty 2

## 2015-08-28 MED ORDER — ACETAMINOPHEN 650 MG RE SUPP
650.0000 mg | Freq: Four times a day (QID) | RECTAL | Status: DC | PRN
Start: 2015-08-28 — End: 2015-08-30

## 2015-08-28 MED ORDER — BUPIVACAINE HCL (PF) 0.5 % IJ SOLN
INTRAMUSCULAR | Status: DC | PRN
  Administered 2015-08-28: 3 mL via INTRATHECAL

## 2015-08-28 MED ORDER — BUPIVACAINE-EPINEPHRINE 0.25% -1:200000 IJ SOLN
INTRAMUSCULAR | Status: DC | PRN
  Administered 2015-08-28: 30 mL

## 2015-08-28 MED ORDER — METOCLOPRAMIDE HCL 5 MG/ML IJ SOLN: 5.0000 mg | Freq: Three times a day (TID) | INTRAMUSCULAR | Status: DC | PRN

## 2015-08-28 MED ORDER — BUPIVACAINE LIPOSOME 1.3 % IJ SUSP
20.0000 mL | INTRAMUSCULAR | Status: AC
  Administered 2015-08-28: 20 mL
  Filled 2015-08-28: qty 20

## 2015-08-28 MED ORDER — FENTANYL CITRATE (PF) 250 MCG/5ML IJ SOLN
INTRAMUSCULAR | Status: AC
  Filled 2015-08-28: qty 5

## 2015-08-28 MED ORDER — LORATADINE 10 MG PO TABS
10.0000 mg | ORAL_TABLET | Freq: Every day | ORAL | Status: DC
  Administered 2015-08-29 – 2015-08-30 (×2): 10 mg via ORAL
  Filled 2015-08-28 (×2): qty 1

## 2015-08-28 MED ORDER — LIDOCAINE HCL (CARDIAC) 20 MG/ML IV SOLN
INTRAVENOUS | Status: AC
  Filled 2015-08-28: qty 5

## 2015-08-28 MED ORDER — HYDROMORPHONE HCL 1 MG/ML IJ SOLN
1.0000 mg | INTRAMUSCULAR | Status: DC | PRN
  Administered 2015-08-28 – 2015-08-30 (×7): 1 mg via INTRAVENOUS
  Filled 2015-08-28 (×7): qty 1

## 2015-08-28 MED ORDER — AZELASTINE HCL 0.1 % NA SOLN: 1.0000 | Freq: Two times a day (BID) | NASAL | Status: DC | PRN

## 2015-08-28 MED ORDER — LACTATED RINGERS IV SOLN
INTRAVENOUS | Status: DC
  Administered 2015-08-28 (×2): via INTRAVENOUS

## 2015-08-28 MED ORDER — CEFAZOLIN SODIUM-DEXTROSE 2-4 GM/100ML-% IV SOLN
2.0000 g | Freq: Four times a day (QID) | INTRAVENOUS | Status: AC
  Administered 2015-08-28 – 2015-08-29 (×2): 2 g via INTRAVENOUS
  Filled 2015-08-28 (×2): qty 100

## 2015-08-28 MED ORDER — ALBUTEROL SULFATE (2.5 MG/3ML) 0.083% IN NEBU: 2.5000 mg | INHALATION_SOLUTION | RESPIRATORY_TRACT | Status: DC | PRN

## 2015-08-28 MED ORDER — BUPIVACAINE-EPINEPHRINE (PF) 0.25% -1:200000 IJ SOLN
INTRAMUSCULAR | Status: AC
  Filled 2015-08-28: qty 60

## 2015-08-28 MED ORDER — PHENOL 1.4 % MT LIQD: 1.0000 | OROMUCOSAL | Status: DC | PRN

## 2015-08-28 MED ORDER — MONTELUKAST SODIUM 10 MG PO TABS
10.0000 mg | ORAL_TABLET | Freq: Every day | ORAL | Status: DC
  Administered 2015-08-28 – 2015-08-29 (×2): 10 mg via ORAL
  Filled 2015-08-28 (×2): qty 1

## 2015-08-28 MED ORDER — BACLOFEN 10 MG PO TABS
10.0000 mg | ORAL_TABLET | Freq: Three times a day (TID) | ORAL | Status: DC
  Administered 2015-08-28 – 2015-08-30 (×5): 10 mg via ORAL
  Filled 2015-08-28 (×5): qty 1

## 2015-08-28 MED ORDER — PROPOFOL 1000 MG/100ML IV EMUL
INTRAVENOUS | Status: AC
  Filled 2015-08-28: qty 100

## 2015-08-28 MED ORDER — FENTANYL CITRATE (PF) 100 MCG/2ML IJ SOLN
INTRAMUSCULAR | Status: AC
  Filled 2015-08-28: qty 2

## 2015-08-28 MED ORDER — TRANEXAMIC ACID 1000 MG/10ML IV SOLN
2000.0000 mg | INTRAVENOUS | Status: AC
  Administered 2015-08-28: 2000 mg via TOPICAL
  Filled 2015-08-28: qty 20

## 2015-08-28 MED ORDER — ONDANSETRON HCL 4 MG/2ML IJ SOLN
INTRAMUSCULAR | Status: AC
  Filled 2015-08-28: qty 2

## 2015-08-28 MED ORDER — ONDANSETRON HCL 4 MG PO TABS: 4.0000 mg | ORAL_TABLET | Freq: Four times a day (QID) | ORAL | Status: DC | PRN

## 2015-08-28 MED ORDER — QUINAPRIL HCL 10 MG PO TABS
20.0000 mg | ORAL_TABLET | Freq: Every day | ORAL | Status: DC
  Administered 2015-08-28 – 2015-08-29 (×2): 20 mg via ORAL
  Filled 2015-08-28 (×3): qty 2

## 2015-08-28 MED ORDER — LINAGLIPTIN 5 MG PO TABS
5.0000 mg | ORAL_TABLET | Freq: Every day | ORAL | Status: DC
  Administered 2015-08-29 – 2015-08-30 (×2): 5 mg via ORAL
  Filled 2015-08-28 (×2): qty 1

## 2015-08-28 MED ORDER — ONDANSETRON HCL 4 MG/2ML IJ SOLN: 4.0000 mg | Freq: Four times a day (QID) | INTRAMUSCULAR | Status: DC | PRN

## 2015-08-28 MED ORDER — SODIUM CHLORIDE 0.9 % IV SOLN
1000.0000 mg | INTRAVENOUS | Status: AC
  Administered 2015-08-28: 1000 mg via INTRAVENOUS
  Filled 2015-08-28: qty 10

## 2015-08-28 MED ORDER — SODIUM CHLORIDE 0.9 % IR SOLN
Status: DC | PRN
  Administered 2015-08-28: 3000 mL

## 2015-08-28 MED ORDER — OXYCODONE HCL ER 10 MG PO T12A
10.0000 mg | EXTENDED_RELEASE_TABLET | Freq: Three times a day (TID) | ORAL | Status: DC
  Administered 2015-08-28 – 2015-08-30 (×5): 10 mg via ORAL
  Filled 2015-08-28 (×5): qty 1

## 2015-08-28 MED ORDER — MENTHOL 3 MG MT LOZG: 1.0000 | LOZENGE | OROMUCOSAL | Status: DC | PRN

## 2015-08-28 SURGICAL SUPPLY — 72 items
BANDAGE ELASTIC 4 VELCRO ST LF (GAUZE/BANDAGES/DRESSINGS) IMPLANT
BANDAGE ELASTIC 6 VELCRO ST LF (GAUZE/BANDAGES/DRESSINGS) ×2 IMPLANT
BLADE SURG 10 STRL SS (BLADE) ×8 IMPLANT
BLADE SURG 15 STRL LF DISP TIS (BLADE) IMPLANT
BLADE SURG 15 STRL SS (BLADE) ×6
BNDG COHESIVE 4X5 TAN STRL (GAUZE/BANDAGES/DRESSINGS) ×2 IMPLANT
BNDG GAUZE ELAST 4 BULKY (GAUZE/BANDAGES/DRESSINGS) IMPLANT
CLOSURE STERI-STRIP 1/2X4 (GAUZE/BANDAGES/DRESSINGS) ×1
CLSR STERI-STRIP ANTIMIC 1/2X4 (GAUZE/BANDAGES/DRESSINGS) ×1 IMPLANT
COVER SURGICAL LIGHT HANDLE (MISCELLANEOUS) ×3 IMPLANT
CUFF TOURNIQUET SINGLE 18IN (TOURNIQUET CUFF) ×3 IMPLANT
CUFF TOURNIQUET SINGLE 24IN (TOURNIQUET CUFF) IMPLANT
CUFF TOURNIQUET SINGLE 34IN LL (TOURNIQUET CUFF) ×2 IMPLANT
CUFF TOURNIQUET SINGLE 44IN (TOURNIQUET CUFF) IMPLANT
DRAPE U-SHAPE 47X51 STRL (DRAPES) ×3 IMPLANT
DRSG AQUACEL AG ADV 3.5X14 (GAUZE/BANDAGES/DRESSINGS) ×2 IMPLANT
DRSG PAD ABDOMINAL 8X10 ST (GAUZE/BANDAGES/DRESSINGS) ×4 IMPLANT
DURAPREP 26ML APPLICATOR (WOUND CARE) ×3 IMPLANT
ELECT REM PT RETURN 9FT ADLT (ELECTROSURGICAL) ×3
ELECTRODE REM PT RTRN 9FT ADLT (ELECTROSURGICAL) IMPLANT
FACESHIELD WRAPAROUND (MASK) IMPLANT
FACESHIELD WRAPAROUND OR TEAM (MASK) ×1 IMPLANT
GAUZE SPONGE 4X4 12PLY STRL (GAUZE/BANDAGES/DRESSINGS) IMPLANT
GAUZE XEROFORM 5X9 LF (GAUZE/BANDAGES/DRESSINGS) IMPLANT
GLOVE BIOGEL PI IND STRL 8 (GLOVE) ×1 IMPLANT
GLOVE BIOGEL PI INDICATOR 8 (GLOVE) ×2
GLOVE SURG ORTHO 8.0 STRL STRW (GLOVE) ×3 IMPLANT
GOWN STRL REUS W/ TWL LRG LVL3 (GOWN DISPOSABLE) ×2 IMPLANT
GOWN STRL REUS W/ TWL XL LVL3 (GOWN DISPOSABLE) ×1 IMPLANT
GOWN STRL REUS W/TWL LRG LVL3 (GOWN DISPOSABLE) ×6
GOWN STRL REUS W/TWL XL LVL3 (GOWN DISPOSABLE) ×3
HANDPIECE INTERPULSE COAX TIP (DISPOSABLE) ×3
HOOD PEEL AWAY FACE SHEILD DIS (HOOD) ×4 IMPLANT
KIT BASIN OR (CUSTOM PROCEDURE TRAY) ×3 IMPLANT
KIT ROOM TURNOVER OR (KITS) ×3 IMPLANT
MANIFOLD NEPTUNE II (INSTRUMENTS) ×3 IMPLANT
MAYO 1/2 TAPER ×2 IMPLANT
NDL 18GX1X1/2 (RX/OR ONLY) (NEEDLE) IMPLANT
NEEDLE 18GX1X1/2 (RX/OR ONLY) (NEEDLE) ×3 IMPLANT
NS IRRIG 1000ML POUR BTL (IV SOLUTION) ×5 IMPLANT
PACK ORTHO EXTREMITY (CUSTOM PROCEDURE TRAY) ×3 IMPLANT
PAD ARMBOARD 7.5X6 YLW CONV (MISCELLANEOUS) ×6 IMPLANT
PAD CAST 4YDX4 CTTN HI CHSV (CAST SUPPLIES) IMPLANT
PADDING CAST COTTON 4X4 STRL (CAST SUPPLIES) ×3
PADDING CAST COTTON 6X4 STRL (CAST SUPPLIES) ×2 IMPLANT
SET HNDPC FAN SPRY TIP SCT (DISPOSABLE) IMPLANT
SPONGE LAP 18X18 X RAY DECT (DISPOSABLE) ×7 IMPLANT
SPONGE LAP 4X18 X RAY DECT (DISPOSABLE) ×3 IMPLANT
STOCKINETTE IMPERVIOUS 9X36 MD (GAUZE/BANDAGES/DRESSINGS) ×3 IMPLANT
SUT ETHILON 2 0 FS 18 (SUTURE) IMPLANT
SUT ETHILON 3 0 PS 1 (SUTURE) IMPLANT
SUT ETHILON 4 0 PS 2 18 (SUTURE) IMPLANT
SUT FIBERWIRE #2 38 T-5 BLUE (SUTURE) ×18
SUT MNCRL AB 3-0 PS2 18 (SUTURE) ×2 IMPLANT
SUT PROLENE 3 0 PS 2 (SUTURE) IMPLANT
SUT TIGER TAPE 7 IN WHITE (SUTURE) ×2 IMPLANT
SUT VIC AB 0 CT1 27 (SUTURE) ×12
SUT VIC AB 0 CT1 27XBRD ANBCTR (SUTURE) IMPLANT
SUT VIC AB 1 CT1 27 (SUTURE) ×21
SUT VIC AB 1 CT1 27XBRD ANBCTR (SUTURE) IMPLANT
SUT VIC AB 3-0 SH 27 (SUTURE) ×3
SUT VIC AB 3-0 SH 27X BRD (SUTURE) IMPLANT
SUTURE FIBERWR #2 38 T-5 BLUE (SUTURE) IMPLANT
SYRINGE 20CC LL (MISCELLANEOUS) ×2 IMPLANT
TOWEL OR 17X24 6PK STRL BLUE (TOWEL DISPOSABLE) ×3 IMPLANT
TOWEL OR 17X26 10 PK STRL BLUE (TOWEL DISPOSABLE) ×3 IMPLANT
TUBE ANAEROBIC SPECIMEN COL (MISCELLANEOUS) IMPLANT
TUBE CONNECTING 12'X1/4 (SUCTIONS) ×1
TUBE CONNECTING 12X1/4 (SUCTIONS) ×2 IMPLANT
UNDERPAD 30X30 INCONTINENT (UNDERPADS AND DIAPERS) ×1 IMPLANT
WATER STERILE IRR 1000ML POUR (IV SOLUTION) ×1 IMPLANT
YANKAUER SUCT BULB TIP NO VENT (SUCTIONS) ×3 IMPLANT

## 2015-08-28 NOTE — Transfer of Care (Signed)
Immediate Anesthesia Transfer of Care Note  Patient: Tabitha Oneal  Procedure(s) Performed: Procedure(s): LEFT KNEE ARTHROFIBROSIS EXCISION; pylectomy (Left)  Patient Location: PACU  Anesthesia Type:Spinal  Level of Consciousness: awake, alert  and oriented  Airway & Oxygen Therapy: Patient Spontanous Breathing  Post-op Assessment: Report given to RN  Post vital signs: Reviewed and stable  Last Vitals:  Filed Vitals:   08/28/15 1211  BP: 182/71  Pulse: 73  Temp: 36.7 C  Resp: 20    Complications: No apparent anesthesia complications

## 2015-08-28 NOTE — Anesthesia Procedure Notes (Addendum)
Spinal Patient location during procedure: OR Staffing Anesthesiologist: Catalina Gravel Performed by: anesthesiologist  Preanesthetic Checklist Completed: patient identified, surgical consent, pre-op evaluation, timeout performed, IV checked, risks and benefits discussed and monitors and equipment checked Spinal Block Patient position: sitting Prep: site prepped and draped and DuraPrep Patient monitoring: continuous pulse ox and blood pressure Approach: midline Location: L3-4 Needle Needle type: Pencan  Needle gauge: 24 G Needle length: 9 cm Additional Notes Functioning IV was confirmed and monitors were applied. Sterile prep and drape, including hand hygiene, mask and sterile gloves were used. The patient was positioned and the spine was prepped. The skin was anesthetized with lidocaine.  Free flow of clear CSF was obtained prior to injecting local anesthetic into the CSF.  The spinal needle aspirated freely following injection.  The needle was carefully withdrawn.  The patient tolerated the procedure well. Consent was obtained prior to procedure with all questions answered and concerns addressed. Risks including but not limited to bleeding, infection, nerve damage, paralysis, failed block, inadequate analgesia, allergic reaction, high spinal, itching and headache were discussed and the patient wished to proceed.   Hoy Morn, MD  Procedure Name: [facility] Date/Time: 08/28/2015 2:49 PM Performed by: Barrington Ellison Pre-anesthesia Checklist: Patient identified, Emergency Drugs available, Suction available, Patient being monitored and Timeout performed Patient Re-evaluated:Patient Re-evaluated prior to inductionOxygen Delivery Method: Nasal cannula

## 2015-08-28 NOTE — Anesthesia Preprocedure Evaluation (Addendum)
Anesthesia Evaluation  Patient identified by MRN, date of birth, ID band Patient awake    Reviewed: Allergy & Precautions, NPO status , Patient's Chart, lab work & pertinent test results  Airway Mallampati: II  TM Distance: >3 FB Neck ROM: Full    Dental  (+) Dental Advisory Given, Edentulous Upper, Upper Dentures   Pulmonary shortness of breath, asthma ,    Pulmonary exam normal breath sounds clear to auscultation       Cardiovascular Exercise Tolerance: Poor hypertension, Pt. on medications (-) CAD and (-) Past MI Normal cardiovascular exam Rhythm:Regular Rate:Normal     Neuro/Psych  Headaches, negative psych ROS   GI/Hepatic Neg liver ROS, GERD  Medicated,  Endo/Other  diabetes, Type 2, Oral Hypoglycemic AgentsObesity   Renal/GU negative Renal ROS     Musculoskeletal  (+) Arthritis , Osteoarthritis,    Abdominal   Peds  Hematology  (+) Blood dyscrasia, anemia , Plt 272k   Anesthesia Other Findings Day of surgery medications reviewed with the patient.  Reproductive/Obstetrics                            Anesthesia Physical Anesthesia Plan  ASA: II  Anesthesia Plan: Spinal and MAC   Post-op Pain Management:    Induction: Intravenous  Airway Management Planned: Simple Face Mask  Additional Equipment:   Intra-op Plan:   Post-operative Plan:   Informed Consent: I have reviewed the patients History and Physical, chart, labs and discussed the procedure including the risks, benefits and alternatives for the proposed anesthesia with the patient or authorized representative who has indicated his/her understanding and acceptance.   Dental advisory given  Plan Discussed with: CRNA, Anesthesiologist and Surgeon  Anesthesia Plan Comments: (Discussed risks and benefits of and differences between spinal and general. Discussed risks of spinal including headache, backache, failure,  bleeding, infection, and nerve damage. Patient consents to spinal. Questions answered. Coagulation studies and platelet count acceptable.)       Anesthesia Quick Evaluation

## 2015-08-28 NOTE — H&P (Signed)
Tabitha Oneal is an 60 y.o. female.   Chief Complaint: lleft knee pain and stiffness HPI: following this 31-year-old female with left knee pain and stiffness she had total knee replacement done by another surgeon in 2013 and underwent 2 manipulations but still has developed significant arthrofibrosis and reports pain in the knee. Preoperative clinical workup is negative for infection and loosening by lab work and bone scan respectively.  This does report pain with ambulation and inability to bend the knee is adversely affecting her quality of life components appear to be in good position. 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 There is some periosteal reaction above the anterior flange of the femoral component which may be contributing tosuprapatellar pouch obliteration and loss of flexion  Past Medical History  Diagnosis Date  . Osteoarthritis of left knee 02/23/2012  . Hard of hearing   . Asthma     QVAR daily and Albuterol as needed  . GERD (gastroesophageal reflux disease)     takes Omeprazole daily  . Seasonal allergies     takes Claritin and Singulair daily.Nasal spray as needed  . Hyperlipidemia     takes Atorvastatin daily  . Diabetes mellitus     takes Metformin and Tradjenta daily  . Hypertension     takes Quinapril daily  . Bladder infection     taking Keflex daily   . Shortness of breath dyspnea     with exertion  . Headache     daily.Takes Excedrine daily  . Joint pain   . Joint swelling   . Chronic back pain     reason unknown  . Nocturia   . History of blood transfusion     no abnormal reaction noted    Past Surgical History  Procedure Laterality Date  . Joint replacement      acl   bil knees  . Abdominal hysterectomy    . Total knee arthroplasty  02/23/2012    Procedure: TOTAL KNEE ARTHROPLASTY;  Surgeon: Johnny Bridge, MD;  Location: [place];  Service: Orthopedics;  Laterality: Left;  . Knee closed reduction  04/09/2012    Procedure: CLOSED MANIPULATION KNEE;  Surgeon:  Johnny Bridge, MD;  Location: [place];  Service: Orthopedics;  Laterality: Left;  Manipulation Knee with Anesthesia includes Application of Traction   . Injection knee  04/09/2012    Procedure: KNEE INJECTION;  Surgeon: Johnny Bridge, MD;  Location: [place];  Service: Orthopedics;  Laterality: Left;  . Colonoscopy      Family History  Problem Relation Age of Onset  . Bronchitis Father 80  . Alzheimer's disease Mother   . Diabetes Daughter   . Diabetes Daughter   . Diabetes     Social History:  reports that she has never smoked. She has never used smokeless tobacco. She reports that she does not drink alcohol or use illicit drugs.  Allergies: No Known Allergies  No prescriptions prior to admission    No results found for this or any previous visit (from the past 48 hour(s)). No results found.  Review of Systems  Constitutional: Negative.   HENT: Negative.   Eyes: Negative.   Respiratory: Negative.   Cardiovascular: Negative.   Gastrointestinal: Negative.   Genitourinary: Negative.   Musculoskeletal: Positive for joint pain.  Skin: Negative.   Neurological: Negative.   Endo/Heme/Allergies: Negative.   Psychiatric/Behavioral: Negative.     There were no vitals taken for this visit. Physical Exam  Constitutional: She appears  well-developed.  HENT:  Head: Normocephalic.  Eyes: Pupils are equal, round, and reactive to light.  Neck: Normal range of motion.  Cardiovascular: Normal rate.   Respiratory: Effort normal.  Neurological: She is alert.  Skin: Skin is warm.  Psychiatric: She has a normal mood and affect.  examination of the left knee demonstrates palpable pedal pulse about a 10 flexion arc range of motion..There is very little patella mobility medially and laterally. 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 She is progressed on to significant arthrofibrosis. Components appear well positioned and aligned. No evidence of infection or loosening. Patella Baha is present along with some  Periosteal reaction and new bone formation proximal to the anterior superior flange of the femoral prosthesis plan at this time is open removal of  Arthrofibrosis tissue. Limited goals of the surgery discussed with the patient. Risk of fracture as well as patella tendon avulsion discussed along with incomplete regaining of range of motion as well as potential for infection. I think it is possible we can achieve more range of motion but getting it to 90 will be very difficult. Patient understands the risks and benefits and he wishes to proceed at this time. All questions answered. Anticipate use of CPM machine postop.  Meredith Pel, MD 08/28/2015, 10:50 AM

## 2015-08-28 NOTE — Progress Notes (Signed)
Orthopedic Tech Progress Note Patient Details:  Tabitha Oneal 03-Sep-1955 [id number] Applied CPM to LLE.  Applied OHF with trapeze to pt.'s bed.  CPM at 10 degrees extension and 40 degrees flexion per verbal order. Left Bone Foam with pt.'s nurse. CPM Left Knee CPM Left Knee: On Left Knee Flexion (Degrees): 40 Left Knee Extension (Degrees): 10   Darrol Poke 08/28/2015, 9:42 PM

## 2015-08-28 NOTE — Brief Op Note (Signed)
08/28/2015  5:21 PM  PATIENT:  Tabitha Oneal  60 y.o. female  PRE-OPERATIVE DIAGNOSIS:  left knee arthrofibrosis  POST-OPERATIVE DIAGNOSIS:  left knee arthrofibrosis  PROCEDURE:  Procedure(s): LEFT KNEE ARTHROFIBROSIS EXCISION; pylectomy  SURGEON:  Surgeon(s): Meredith Pel, MD  ASSISTANT: Laure Kidney RNFA  ANESTHESIA:   spinal  EBL: 100 ml    Total I/O In: 1000 [I.V.:1000] Out: -   BLOOD ADMINISTERED: none  DRAINS: none   LOCAL MEDICATIONS USED:  mso4 marcaine exparel clonidine  SPECIMEN:  No Specimen  COUNTS:  YES  TOURNIQUET:   Total Tourniquet Time Documented: Thigh (Left) - 84 minutes Total: Thigh (Left) - 84 minutes   DICTATION: .Other Dictation: Dictation Number [id number]  PLAN OF CARE: Admit for overnight observation  PATIENT DISPOSITION:  PACU - hemodynamically stable

## 2015-08-29 DIAGNOSIS — M24662 Ankylosis, left knee: Secondary | ICD-10-CM | POA: Diagnosis not present

## 2015-08-29 NOTE — Care Management Obs Status (Signed)
[place] NOTIFICATION   Patient Details  Name: Tabitha Oneal MRN: [id number] Date of Birth: [birth year]   Medicare Observation Status Notification Given:  Yes    Sharin Mons, RN 08/29/2015, 5:12 PM

## 2015-08-29 NOTE — Op Note (Signed)
NAME:  [name], [name] NO.:  [id number]  MEDICAL RECORD NO.:  [id number]  LOCATION:  5W29C                        FACILITY:  [facility]  PHYSICIAN:  Anderson Malta, M.D.    DATE OF BIRTH:  Apr 07, 1956  DATE OF PROCEDURE: DATE OF DISCHARGE:                              OPERATIVE REPORT   PREOPERATIVE DIAGNOSIS:  Left knee severe arthrofibrosis.  POSTOPERATIVE DIAGNOSIS:  Left knee severe arthrofibrosis.  PROCEDURE:  Left knee arthrofibrosis excision with patellectomy.  SURGEON:  Anderson Malta, M.D.  ASSISTANT:  Laure Kidney, RNFA  TOURNIQUET TIME:  84 minutes at 300 mmHg.  INDICATIONS:  Tabitha Oneal is a 60 year old patient about 4 years out left total knee replacement, essentially has 10 degrees of motion with a 15-degree flexion contracture, presents for operative management after explanation of risks and benefits.  PROCEDURE IN DETAIL:  The patient was brought to the operating room where spinal anesthetic was induced.  Preoperative antibiotics were administered.  Time-out was called.  Left leg was prescrubbed with alcohol and Betadine, allowed to air dry, prepped with DuraPrep solution and draped in a sterile manner.  Examination under anesthesia demonstrated basically 15-degree flexion contracture with 10 degrees of flexion.  The patella was completely locked in the trochlea.  At this time, after covering the operative field with Ioban and elevating and exsanguinating the knee, anterior approach to the knee was made.  Skin and subcutaneous tissue were sharply divided.  Median parapatellar approach was made, again the patella did not even move.  Femur was identified proximally.  Medial and lateral gutters were reestablished using the Cobb elevator.  The implant was actually dissected down upon on the medial femoral condyle.  Once metallic implants on the femur was identified, dissection was performed proximally and distally.  Soft tissue elevated  distally between the patellar tendon and the tibial tubercle.  Pin was placed in the patellar tendon, tibial tubercle to prevent avulsion.  Soft tissue was excised from the medial and lateral gutters.  The lateral release was performed, actually did discover the undersurface of the patella component.  Significantly overgrown with bone and there was actually bone, which had grown between the patella and the distal femur.  This was excised.  After the patella was mobilized, medial and lateral gutters were reestablished and soft tissue removed from the proximal tibial patellar tendon interface.  The knee was flexed.  We were actually able to get it flex to about 110 degrees with the patella subluxated laterally.  However, with the patella in the groove, we were not able to really achieve much in the way of flexion more than potentially 20 degrees.  At this time, it was decided to perform a patellectomy.  In general, because of the patient has had a stiff locked knee for that many years given the patellectomy with at least give her the chance of achieving motion.  Patellectomy was performed.  The ends were reapproximated using #2 FiberWire suture. Still achieved about 70 degrees of flexion with this maneuver.  At this time, tranexamic acid was placed within the incision and allowed to sit for 3 minutes.  Thorough irrigation was then performed.  Exparel applied.  Extensor mechanism was repaired.  Median parapatellar approach was repaired using #1 Vicryl suture.  Skin closed using 0 Vicryl suture, 2-0 Vicryl and 3-0 Monocryl. Solution of Marcaine, morphine and clonidine injected to the knee.  The patient did tolerate the procedure well without immediate complications. Bulky dressing applied.  Knee immobilizer applied to start CPM machine tonight.     Anderson Malta, M.D.     GSD/MEDQ  D:  08/28/2015  T:  08/29/2015  Job:  [id number]

## 2015-08-29 NOTE — Progress Notes (Signed)
Nutrition Brief Note  Patient identified on the Malnutrition Screening Tool (MST) Report  Wt Readings from Last 15 Encounters:  08/28/15 205 lb 4 oz (93.1 kg)  08/22/15 203 lb (92.08 kg)  08/15/15 200 lb 9.9 oz (91 kg)  08/03/15 204 lb (92.534 kg)  04/13/15 198 lb 9.6 oz (90.084 kg)  01/10/15 190 lb (86.183 kg)  01/05/15 196 lb 9.6 oz (89.177 kg)  10/06/14 194 lb 3.2 oz (88.089 kg)  08/30/14 196 lb (88.905 kg)  08/23/13 195 lb (88.451 kg)  04/09/12 203 lb 2 oz (92.137 kg)  02/24/12 211 lb 3.2 oz (95.8 kg)  02/09/12 211 lb 3.2 oz (95.8 kg)   following this 60-year-old female with left knee pain and stiffness she had total knee replacement done by another surgeon in 2013 and underwent 2 manipulations but still has developed significant arthrofibrosis and reports pain in the knee. Preoperative clinical workup is negative for infection and loosening by lab work and bone scan respectively. This does report pain with ambulation and inability to bend the knee is adversely affecting her quality of life components appear to be in good position. 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 There is some periosteal reaction above the anterior flange of the femoral component which may be contributing tosuprapatellar pouch obliteration and loss of flexion  S/p Procedure(s) 08/28/15: LEFT KNEE ARTHROFIBROSIS EXCISION; pylectomy  Spoke with pt and daughter at bedside. Pt reports her appetite is returning. She generally consumes 2 meals per day (sandwich and spaghetti or fried chicken). Noted pt only consumed about 15% of her breakfast meal, but she states this is normal for her.   Pt denies any weight loss. She reveals UBW of 195#. Pt daughter reveals that pt lost weight approximately 3 years ago due to grieving the death of pt's husband, however, wt has been stable over the past 1-2 years.   Nutrition-Focused physical exam completed. Findings are no fat depletion, no muscle depletion, and no edema.   Body mass index is  36.37 kg/(m^2). Patient meets criteria for obesity, class II based on current BMI.   Current diet order is soft, patient is consuming approximately 15% of meals at this time. Labs and medications reviewed.   No nutrition interventions warranted at this time. If nutrition issues arise, please consult RD.   Apolonia Ellwood A. Jimmye Norman, RD, LDN, CDE Pager: [phone] After hours Pager: [phone]

## 2015-08-29 NOTE — Anesthesia Postprocedure Evaluation (Signed)
Anesthesia Post Note  Patient: Tabitha Oneal  Procedure(s) Performed: Procedure(s) (LRB): LEFT KNEE ARTHROFIBROSIS EXCISION; pylectomy (Left)  Patient location during evaluation: PACU Anesthesia Type: Spinal Level of consciousness: oriented and awake and alert Pain management: pain level controlled Vital Signs Assessment: post-procedure vital signs reviewed and stable Respiratory status: spontaneous breathing, respiratory function stable and patient connected to nasal cannula oxygen Cardiovascular status: blood pressure returned to baseline and stable Postop Assessment: no headache and no backache Anesthetic complications: no    Last Vitals:  Filed Vitals:   08/28/15 2119 08/29/15 0520  BP: 146/71 151/73  Pulse: 70 78  Temp:  36.8 C  Resp: 17 18    Last Pain:  Filed Vitals:   08/29/15 0656  PainSc: 9                  Catalina Gravel

## 2015-08-29 NOTE — Progress Notes (Signed)
Occupational Therapy Evaluation Patient Details Name: Tabitha Oneal MRN: [id number] DOB: 11-Jun-1955 Today's Date: 08/29/2015    History of Present Illness 60 year old female with left knee pain and stiffness she had total knee replacement done by another surgeon in 2013 and underwent 2 manipulations but still has developed significant arthrofibrosis and reports pain in the knee. Underwent Left knee arthrofibrosis excision with patellectomy on 09/27/2015.   Clinical Impression   Pt making good progress. Ambulating with min A @ RW level. Min A for LB ADL. Family present for session and will be able to provide 24/7 assist as needed after D/C. Will see in am to complete education to facilitate safe D/C home and address established goals.     Follow Up Recommendations  No OT follow up;Supervision/Assistance - 24 hour    Equipment Recommendations  3 in 1 bedside comode (unsure if they can find their [facility])    Recommendations for Other Services       Precautions / Restrictions Precautions Precautions: Fall Precaution Comments: CPM as much as possible.  Restrictions Weight Bearing Restrictions: No      Mobility Bed Mobility Overal bed mobility: Needs Assistance Bed Mobility: Supine to Sit     Supine to sit: Supervision     General bed mobility comments: Educated pt on use of RLE to lift LLE off bed. Pt able to return demonstrate. Family present to learn technique  Transfers Overall transfer level: Needs assistance Equipment used: Rolling walker (2 wheeled)   Sit to Stand: Min assist         General transfer comment: vc for correct hand placement    Balance     Sitting balance-Leahy Scale: Good       Standing balance-Leahy Scale: Fair                              ADL Overall ADL's : Needs assistance/impaired     Grooming: Set up;Sitting   Upper Body Bathing: Set up;Sitting   Lower Body Bathing: Minimal assistance;Sit to/from stand   Upper  Body Dressing : Set up;Sitting   Lower Body Dressing: Minimal assistance;Sit to/from stand   Toilet Transfer: Minimal assistance;BSC;Ambulation   Toileting- Clothing Manipulation and Hygiene: Supervision/safety;Sitting/lateral lean       Functional mobility during ADLs: Minimal assistance;Rolling walker;Cueing for safety;Cueing for sequencing General ADL Comments: Pt able to reach toes on affected extremity. Began educating pt/family on compensatory techniqeus for ADL. Discussed bathroom set up and use of 3 in 1 facing out of tub to reduce risk of falls and decrease need for more DME.      Vision     Perception     Praxis      Pertinent Vitals/Pain Pain Assessment: Faces Faces Pain Scale: Hurts little more Pain Location: L knee Pain Descriptors / Indicators: Grimacing Pain Intervention(s): Limited activity within patient's tolerance;Repositioned     Hand Dominance Right   Extremity/Trunk Assessment Upper Extremity Assessment Upper Extremity Assessment: Overall WFL for tasks assessed   Lower Extremity Assessment Lower Extremity Assessment: Defer to PT evaluation   Cervical / Trunk Assessment Cervical / Trunk Assessment: Normal   Communication Communication Communication: No difficulties   Cognition Arousal/Alertness: Awake/alert Behavior During Therapy: WFL for tasks assessed/performed Overall Cognitive Status:  (appears to have cognitive deficits at baseline? slow processing and problem solving)                     General  Comments       Exercises       Shoulder Instructions      Home Living Family/patient expects to be discharged to:: Private residence Living Arrangements: Alone Available Help at Discharge: Family;Available 24 hours/day Type of Home: House Home Access: Level entry     Home Layout: One level     Bathroom Shower/Tub: Tub/shower unit Shower/tub characteristics: Architectural technologist: Standard Bathroom Accessibility:  Yes How Accessible: Accessible via walker Home Equipment: Emmett - 2 wheels;Cane - quad          Prior Functioning/Environment Level of Independence: Independent with assistive device(s)        Comments: Pt states she was independent PTA including ADL's and IADL's. She still drives but also uses a quad cane to walk.     OT Diagnosis: Generalized weakness;Acute pain   OT Problem List: Decreased strength;Decreased range of motion;Decreased activity tolerance;Impaired balance (sitting and/or standing);Decreased safety awareness;Decreased knowledge of use of DME or AE;Decreased knowledge of precautions;Obesity;Pain   OT Treatment/Interventions: Self-care/ADL training;DME and/or AE instruction;Therapeutic activities;Patient/family education    OT Goals(Current goals can be found in the care plan section) Acute Rehab OT Goals Patient Stated Goal: To go home OT Goal Formulation: With patient/family Time For Goal Achievement: 09/05/15 Potential to Achieve Goals: Good ADL Goals Pt Will Perform Lower Body Bathing: with min guard assist;with caregiver independent in assisting;sit to/from stand Pt Will Perform Lower Body Dressing: with min guard assist;with caregiver independent in assisting;sit to/from stand Pt Will Transfer to Toilet: with min guard assist;ambulating;bedside commode (with family independent in assisting) Pt Will Perform Tub/Shower Transfer: Tub transfer;3 in 1;ambulating;rolling walker;with caregiver independent in assisting;with min guard assist  OT Frequency: Min 2X/week   Barriers to D/C:            Co-evaluation              End of Session Equipment Utilized During Treatment: Gait belt;Rolling walker CPM Left Knee CPM Left Knee: Off Nurse Communication: Mobility status  Activity Tolerance: Patient tolerated treatment well Patient left: in chair;with call bell/phone within reach;with family/visitor present;Other (comment) (in zero knee)   Time:  1455-1530 OT Time Calculation (min): 35 min Charges:  OT General Charges $OT Visit: 1 Procedure OT Evaluation $OT Eval Moderate Complexity: 1 Procedure OT Treatments $Self Care/Home Management : 8-22 mins G-Codes:    Tabitha Oneal,HILLARY 13-Sep-2015, 3:50 PM   [facility], OTR/L  [phone] 09-13-2015

## 2015-08-29 NOTE — Evaluation (Signed)
Physical Therapy Evaluation Patient Details Name: Tabitha Oneal MRN: [id number] DOB: 1955/07/23 Today's Date: 08/29/2015   History of Present Illness  60 year old female with left knee pain and stiffness she had total knee replacement done by another surgeon in 2013 and underwent 2 manipulations but still has developed significant arthrofibrosis and reports pain in the knee. Underwent Left knee arthrofibrosis excision with patellectomy on 09/27/2015.    Clinical Impression  Pt admitted with above diagnosis. Pt currently with functional limitations due to the deficits listed below (see PT Problem List). Pt moves very well with functional mobility but needs increased time due to pain. Pt will benefit from skilled PT to increase their independence and safety with mobility to allow discharge to the venue listed below.  Pt has appropriate help at home as well as transportation to outpatient PT appointments.      Follow Up Recommendations Outpatient PT;Supervision/Assistance - 24 hour    Equipment Recommendations  3in1 (PT)    Recommendations for Other Services       Precautions / Restrictions Precautions Precautions: Fall Precaution Comments: CPM as much as possible.  Restrictions Weight Bearing Restrictions: No      Mobility  Bed Mobility Overal bed mobility: Needs Assistance Bed Mobility: Supine to Sit;Sit to Supine     Supine to sit: Min assist Sit to supine: Min assist   General bed mobility comments: Pt just needs help moving her L LE due to pain. Able to scoot out to the EOB and scoot up to the [facility] on her own as well as sit up to long sit on her own. Pt is just VERY slow moving due to pain.   Transfers Overall transfer level: Needs assistance Equipment used: Rolling walker (2 wheeled) Transfers: Sit to/from Stand Sit to Stand: Min assist         General transfer comment: Pt needs Min A to stand from low surface but only requires MinGuard for elevated surfaces.  Pt needs increased time for all mobility.   Ambulation/Gait Ambulation/Gait assistance: Min guard Ambulation Distance (Feet): 40 Feet Assistive device: Rolling walker (2 wheeled) Gait Pattern/deviations: Step-to pattern;Decreased step length - right;Decreased stance time - left;Decreased weight shift to left;Antalgic;Trunk flexed;Wide base of support Gait velocity: very slow   General Gait Details: Pt has very guarded gait with L LE. Gave VC's for sequencing of RW with painful L LE. Pt just puts increased weight through arms to be able to advance her R LE due to pain with weightshift to L LE.   Stairs            Wheelchair Mobility    Modified Rankin (Stroke Patients Only)       Balance Overall balance assessment: Needs assistance Sitting-balance support: Bilateral upper extremity supported;Feet supported Sitting balance-Leahy Scale: Fair Sitting balance - Comments: Pt leans heavily on her arms as she does not want to put weight through her L LE to help her balance sitting unsupported on the EOB.    Standing balance support: Bilateral upper extremity supported Standing balance-Leahy Scale: Fair Standing balance comment: Reliant on RW due to increased pain .                              Pertinent Vitals/Pain Pain Assessment: Faces Faces Pain Scale: Hurts even more Pain Location: L Knee Pain Descriptors / Indicators: Constant;Discomfort;Guarding;Grimacing Pain Intervention(s): Monitored during session;Repositioned;RN gave pain meds during session    Home Living Family/patient expects  to be discharged to:: Private residence Living Arrangements: Alone Available Help at Discharge: Family;Available 24 hours/day Type of Home: House Home Access: Level entry     Home Layout: One level Home Equipment: Walker - 2 wheels;Cane - quad Additional Comments: Pt's daughter in room and present for eval states that her and her 2 other siblings can all rotate to make sure  that their mother has all the help she needs at home and can take her to appointments.     Prior Function Level of Independence: Independent with assistive device(s)         Comments: Pt states she was independent PTA including ADL's and IADL's. She still drives but also uses a quad cane to walk.      Hand Dominance   Dominant Hand: Right    Extremity/Trunk Assessment   Upper Extremity Assessment: Overall WFL for tasks assessed           Lower Extremity Assessment: LLE deficits/detail   LLE Deficits / Details: unable to actively bend much at the knee due to pain. PROM limited to 10 degrees extension and 40 degrees flexion in CPM machine.   Cervical / Trunk Assessment: Kyphotic  Communication   Communication: No difficulties  Cognition Arousal/Alertness: Awake/alert Behavior During Therapy: WFL for tasks assessed/performed Overall Cognitive Status: Within Functional Limits for tasks assessed                      General Comments General comments (skin integrity, edema, etc.): Pt very pleasant and cooperative with therapy but somewhat anxious to move due to pain. She is capable of moving with little assistance, just needs a lot of increased time.     Exercises        Assessment/Plan    PT Assessment Patient needs continued PT services  PT Diagnosis Difficulty walking;Abnormality of gait;Generalized weakness;Acute pain   PT Problem List Decreased strength;Decreased range of motion;Decreased activity tolerance;Decreased balance;Decreased knowledge of use of DME;Pain  PT Treatment Interventions DME instruction;Gait training;Functional mobility training;Therapeutic activities;Therapeutic exercise;Balance training;Patient/family education   PT Goals (Current goals can be found in the Care Plan section) Acute Rehab PT Goals Patient Stated Goal: To go home PT Goal Formulation: With patient Time For Goal Achievement: 09/12/15 Potential to Achieve Goals: Good     Frequency Min 4X/week   Barriers to discharge        Co-evaluation               End of Session Equipment Utilized During Treatment: Gait belt Activity Tolerance: Patient tolerated treatment well Patient left: in bed;in CPM;with bed alarm set;with family/visitor present Nurse Communication: Mobility status    Functional Assessment Tool Used: clinical judgment Functional Limitation: Mobility: Walking and moving around Mobility: Walking and Moving Around Current Status [phone]): At least 20 percent but less than 40 percent impaired, limited or restricted Mobility: Walking and Moving Around Goal Status [phone]): At least 1 percent but less than 20 percent impaired, limited or restricted    Time: 0905-0946 PT Time Calculation (min) (ACUTE ONLY): 41 min   Charges:   PT Evaluation $PT Eval Moderate Complexity: 1 Procedure PT Treatments $Gait Training: 8-22 mins $Therapeutic Activity: 8-22 mins   PT G Codes:   PT G-Codes **NOT FOR INPATIENT CLASS** Functional Assessment Tool Used: clinical judgment Functional Limitation: Mobility: Walking and moving around Mobility: Walking and Moving Around Current Status [id number]): At least 20 percent but less than 40 percent impaired, limited or restricted Mobility: Walking and Moving  Around Goal Status [phone]): At least 1 percent but less than 20 percent impaired, limited or restricted   Colon Branch, SPT Colon Branch 08/29/2015, 10:47 AM

## 2015-08-29 NOTE — Progress Notes (Signed)
Orthopedic Tech Progress Note Patient Details:  Tabitha Oneal 09-Oct-1955 [id number]  Patient ID: Tabitha Oneal, female   DOB: 07/16/1955, 60 y.o.   MRN: [id number] Applied cpm 0-35. 0-40 was to painful  Tabitha Oneal 08/29/2015, 6:54 AM

## 2015-08-29 NOTE — Progress Notes (Signed)
Subjective: Pt stable - pain ok in cpm   Objective: Vital signs in last 24 hours: Temp:  [97.7 F (36.5 C)-98.2 F (36.8 C)] 98.2 F (36.8 C) (04/05 0520) Pulse Rate:  [58-78] 78 (04/05 0520) Resp:  [11-20] 18 (04/05 0520) BP: (125-182)/(62-105) 151/73 mmHg (04/05 0520) SpO2:  [97 %-100 %] 97 % (04/05 0520) Weight:  [92.08 kg (203 lb)-93.1 kg (205 lb 4 oz)] 93.1 kg (205 lb 4 oz) (04/04 2119)  Intake/Output from previous day: 04/04 0701 - 04/05 0700 In: 1120 [P.O.:120; I.V.:1000] Out: 100 [Blood:100] Intake/Output this shift:    Exam:  Dorsiflexion/Plantar flexion intact  Labs: No results for input(s): HGB in the last 72 hours. No results for input(s): WBC, RBC, HCT, PLT in the last 72 hours. No results for input(s): NA, K, CL, CO2, BUN, CREATININE, GLUCOSE, CALCIUM in the last 72 hours. No results for input(s): LABPT, INR in the last 72 hours.  Assessment/Plan: Plan pt and cpm - she needs to be relatively constantly in cpm   Tabitha Oneal 08/29/2015, 8:10 AM

## 2015-08-30 ENCOUNTER — Encounter (HOSPITAL_COMMUNITY): Payer: Self-pay | Admitting: Orthopedic Surgery

## 2015-08-30 DIAGNOSIS — M24662 Ankylosis, left knee: Secondary | ICD-10-CM | POA: Diagnosis not present

## 2015-08-30 MED ORDER — OXYCODONE HCL 10 MG PO TABS: 10.0000 mg | ORAL_TABLET | Freq: Four times a day (QID) | ORAL | Status: DC | PRN

## 2015-08-30 MED ORDER — OXYCODONE HCL ER 10 MG PO T12A: 10.0000 mg | EXTENDED_RELEASE_TABLET | Freq: Two times a day (BID) | ORAL | Status: DC

## 2015-08-30 NOTE — Care Management Note (Signed)
Case Management Note  Patient Details  Name: Tabitha Oneal MRN: [id number] Date of Birth: October 26, 1955  Subjective/Objective:     Admitted with arthrofibrosis of knee joint, s/p Left knee arthrofibrosis excision with patellectomy.  Action/Plan: Plan is to d  ischarge to home today. CPM machine to be delivered to pt's home by [facility]), [phone]. CM called [facility] [employer] and left  voice message regarding pt's d/c for today.  Expected Discharge Date:   08/30/2015               Expected Discharge Plan:  Home/Self Care  In-House Referral:     Discharge planning Services     Post Acute Care Choice:    Choice offered to:  Patient  DME Arranged:  CPM (SETUP PRIOR TO HOSPITALIZATION) DME Agency:  [facility](3 IN1/BSC)  HH Arranged:    HH Agency:     Status of Service:  Completed, signed off  Whitman Hero [place], [place] [phone]  08/30/2015, 3:07 PM

## 2015-08-30 NOTE — Progress Notes (Signed)
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 Underwent Left knee arthrofibrosis excision with patellectomy on 09/27/2015.    PT Comments    Pt performed increased gait distance with improve gait pattern.  Pt required max VCs to correct gait sequencing and improve technique.  Follow Up Recommendations  Outpatient PT;Supervision/Assistance - 24 hour     Equipment Recommendations  3in1 (PT)    Recommendations for Other Services       Precautions / Restrictions Precautions Precautions: Fall Precaution Comments: CPM as much as possible.  Restrictions Weight Bearing Restrictions: Yes LLE Weight Bearing: Weight bearing as tolerated    Mobility  Bed Mobility Overal bed mobility: Modified Independent (pt received in recliner chair.  )             General bed mobility comments: Increased time to scoot to edge of chair. Pt using RLE to lift LLE independently and this helped increase independence with scooting to edge of chair seat and functional mobility (transfers).   Transfers Overall transfer level: Needs assistance Equipment used: Rolling walker (2 wheeled) Transfers: Sit to/from Stand Sit to Stand: Supervision         General transfer comment: Supervision for safety. Cues for hand placement/foot placement and safety. x1 from recliner chair and x1 from [facility].  Ambulation/Gait Ambulation/Gait assistance: Min guard Ambulation Distance (Feet): 140 Feet Assistive device: Rolling walker (2 wheeled) Gait Pattern/deviations: Decreased stride length;Antalgic;Trunk flexed;Wide base of support;Step-to pattern Gait velocity: very slow Gait velocity  interpretation: Below normal speed for age/gender General Gait Details: Pt has very guarded gait with L LE. Gave VC's for sequencing of RW and to improve R foot clearance.  Pt required cues for gait symmetry and postural awareness.  Cues for backing and turns as well.     Stairs            Wheelchair Mobility    Modified Rankin (Stroke Patients Only)       Balance                                    Cognition Arousal/Alertness: Awake/alert Behavior During Therapy: WFL for tasks assessed/performed Overall Cognitive Status: Within Functional Limits for tasks assessed       Memory: Decreased short-term memory              Exercises      General Comments        Pertinent Vitals/Pain Pain Assessment: 0-10 Faces Pain Scale: Hurts little more Pain Location: L knee Pain Descriptors / Indicators: Guarding;Grimacing Pain Intervention(s): Limited activity within patient's tolerance;Monitored during session;Repositioned    Home Living                      Prior Function            PT Goals (current goals can now be found in the care plan section) Acute Rehab PT Goals Patient Stated Goal: To go home Potential to Achieve Goals: Good Progress towards PT goals: Progressing toward goals    Frequency  Min 4X/week    PT Plan      Co-evaluation  End of Session Equipment Utilized During Treatment: Gait belt Activity Tolerance: Patient tolerated treatment well Patient left: in chair;with call bell/phone within reach;with chair alarm set     Time: 1550-1605 PT Time Calculation (min) (ACUTE ONLY): 15 min  Charges:  $Gait Training: 8-22 mins                    G Codes:      Tabitha Oneal 09/23/2015, 4:18 PM Tabitha Oneal, PTA pager [phone]

## 2015-08-30 NOTE — Progress Notes (Signed)
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 Underwent Left knee arthrofibrosis excision with patellectomy on 09/27/2015.   OT comments  Patient making good progress towards OT goals, continue plan of care for now. Pt's daughter present during this OT session and is very supportive. Encouraged her to assist pt to/from BR during patient's acute care stay. Discussed this with RN and RN agreed.   Follow Up Recommendations  No OT follow up;Supervision/Assistance - 24 hour    Equipment Recommendations  3 in 1 bedside comode    Recommendations for Other Services  None at this time   Precautions / Restrictions Precautions Precautions: Fall Precaution Comments: CPM as much as possible.  Restrictions Weight Bearing Restrictions: Yes LLE Weight Bearing: Weight bearing as tolerated    Mobility Bed Mobility Overal bed mobility: Needs Assistance Bed Mobility: Supine to Sit     Supine to sit: Supervision     General bed mobility comments: Increased time. Pt using RLE to lift LLE independently and this helped increase independence with bed mobility and functional mobility (transfers).   Transfers Overall transfer level: Needs assistance Equipment used: Rolling walker (2 wheeled) Transfers: Sit to/from Stand Sit to Stand: Min guard;Supervision         General transfer comment: Supervision to min guard for safety. Cues for hand placement and safety. Increased time needed.     Balance Overall balance assessment: Needs assistance Sitting-balance support: No upper extremity supported;Feet supported Sitting balance-Leahy Scale: Good Sitting balance -  Comments: Increased time needed to make it to EOB due to pain, weakness, and anxiety of putting LLE on floor. With increased time, pt able to maintain dynamic sitting balance EOB "good".    Standing balance support: Single extremity supported;During functional activity Standing balance-Leahy Scale: Fair Standing balance comment: Pt standing at sink for grooming task   ADL Overall ADL's : Needs assistance/impaired Eating/Feeding: Set up;Sitting   Grooming: Set up;Supervision/safety;Standing Grooming Details (indicate cue type and reason): at sink Upper Body Bathing: Set up;Sitting   Lower Body Bathing: Minimal assistance;Sit to/from stand   Upper Body Dressing : Set up;Sitting   Lower Body Dressing: Minimal assistance;Sit to/from stand   Toilet Transfer: Supervision/safety;RW;BSC;Ambulation   Toileting- Water quality scientist and Hygiene: Supervision/safety;Sit to/from stand     Tub/Shower Transfer Details (indicate cue type and reason): Discussed tub/shower transfer with patient and patient's daughter. Educated on a variety of techniques and encouraged them to try "dry runs" for safety and to figure out what works best for patient. Pt stated she plans to sponge bath initially .  Functional mobility during ADLs: Rolling walker;Cueing for safety;Cueing for sequencing;Supervision/safety;Min guard General ADL Comments: Continued education on safety with ADL and functional mobility with patient and patient's daughter. Patient's daughter feels confident to assist pt to/from BR while in the hospital, talked with RN and RN & OT feel comfortable with daughter assisiting pt to/from BR prn.      Cognition   Behavior During Therapy: WFL for tasks assessed/performed Overall Cognitive Status:  (appears to have cognitive deficits at baseline. slow to process?)       Memory: Decreased short-term memory  Pertinent Vitals/ Pain       Pain Assessment: Faces Pain Score: 4   Pain Location: L knee Pain Descriptors / Indicators: Grimacing;Guarding Pain Intervention(s): Limited activity within patient's tolerance;Monitored during session;Repositioned   Frequency Min 2X/week     Progress Toward Goals  OT Goals(current goals can now befound in the care plan section)  Progress towards OT goals: Progressing toward goals  Acute Rehab OT Goals Patient Stated Goal: To go home OT Goal Formulation: With patient/family Time For Goal Achievement: 09/05/15 Potential to Achieve Goals: Good  Plan Discharge plan remains appropriate    End of Session Equipment Utilized During Treatment: Gait belt;Rolling walker   Activity Tolerance Patient tolerated treatment well   Patient Left Other (comment) (in BR with daughter present)  Nurse Communication Other (comment);Mobility status (Recommendation that daughter assist pt to/from BR prn)     Time: [id number] OT Time Calculation (min): 32 min  Charges: OT General Charges $OT Visit: 1 Procedure OT Treatments $Self Care/Home Management : 23-37 mins  Chrys Racer , MS, OTR/L, CLT Pager: [phone]  08/30/2015, 10:29 AM

## 2015-08-30 NOTE — Progress Notes (Signed)
Subjective: Pt stable pain ok   Objective: Vital signs in last 24 hours: Temp:  [98.2 F (36.8 C)-100.5 F (38.1 C)] 98.2 F (36.8 C) (04/06 0628) Pulse Rate:  [84-108] 84 (04/06 0628) Resp:  [18] 18 (04/06 0628) BP: (148-162)/(68-79) 148/71 mmHg (04/06 0628) SpO2:  [94 %-100 %] 99 % (04/06 1034)  Intake/Output from previous day: 04/05 0701 - 04/06 0700 In: 240 [P.O.:240] Out: 1100 [Urine:1100] Intake/Output this shift:    Exam:  Dorsiflexion/Plantar flexion intact  Labs: No results for input(s): HGB in the last 72 hours. No results for input(s): WBC, RBC, HCT, PLT in the last 72 hours. No results for input(s): NA, K, CL, CO2, BUN, CREATININE, GLUCOSE, CALCIUM in the last 72 hours. No results for input(s): LABPT, INR in the last 72 hours.  Assessment/Plan: Dc today   Briget Shaheed SCOTT 08/30/2015, 2:24 PM

## 2015-08-30 NOTE — Progress Notes (Signed)
Nsg Discharge Note  Admit Date:  08/28/2015 Discharge date: 08/30/2015   Rachel Moulds to be D/C'd home per MD order.  AVS completed.  Copy for chart, and copy for patient signed, and dated. Patient/caregiver(daughter) able to verbalize understanding.  Discharge Medication:   Medication List    TAKE these medications        albuterol 108 (90 Base) MCG/ACT inhaler  Commonly known as:  PROVENTIL HFA;VENTOLIN HFA  Inhale 2 puffs into the lungs every 4 (four) hours as needed. For wheezing     AMITIZA 24 MCG capsule  Generic drug:  lubiprostone  Take 1 capsule (24 mcg total) by mouth 2 (two) times daily with a meal.     aspirin-acetaminophen-caffeine 250-250-65 MG tablet  Commonly known as:  EXCEDRIN MIGRAINE  Take 2 tablets by mouth daily.     atorvastatin 40 MG tablet  Commonly known as:  LIPITOR  Take 1 tablet (40 mg total) by mouth daily.     azelastine 0.1 % nasal spray  Commonly known as:  ASTELIN  Place 1 spray into the nose 2 (two) times daily as needed for allergies. Use in each nostril as directed     baclofen 10 MG tablet  Commonly known as:  LIORESAL  Take 10 mg by mouth. 1 in morning 2 at bed time     beclomethasone 80 MCG/ACT inhaler  Commonly known as:  QVAR  Inhale 2 puffs into the lungs 2 (two) times daily.     diclofenac sodium 1 % Gel  Commonly known as:  VOLTAREN  Apply 8 g topically 2 (two) times daily as needed. On knees     loratadine 10 MG tablet  Commonly known as:  CLARITIN  Take 1 tablet (10 mg total) by mouth daily.     metFORMIN 500 MG tablet  Commonly known as:  GLUCOPHAGE  Take one tablet by mouth twice daily to control blood sugar     montelukast 10 MG tablet  Commonly known as:  SINGULAIR  TAKE 1 TABLET BY MOUTH AT BEDTIME     omeprazole 40 MG capsule  Commonly known as:  PRILOSEC  Take 1 capsule (40 mg total) by mouth daily.     ONE TOUCH ULTRA TEST test strip  Generic drug:  glucose blood  Check blood sugar once daily as  directed     Oxycodone HCl 10 MG Tabs  Take 1 tablet (10 mg total) by mouth 4 (four) times daily as needed.     oxyCODONE 10 mg 12 hr tablet  Commonly known as:  OXYCONTIN  Take 1 tablet (10 mg total) by mouth every 12 (twelve) hours.     quinapril 20 MG tablet  Commonly known as:  ACCUPRIL  TAKE 1 TABLET BY MOUTH AT BEDTIME     TRADJENTA 5 MG Tabs tablet  Generic drug:  linagliptin  TAKE 1 TABLET EVERY DAY        Discharge Assessment: Filed Vitals:   08/29/15 2140 08/30/15 0628  BP: 154/68 148/71  Pulse: 102 84  Temp: 100.2 F (37.9 C) 98.2 F (36.8 C)  Resp: 18 18   Skin clean, dry and intact without evidence of skin break down, no evidence of skin tears noted. IV catheter discontinued catheter tip intact. Site without signs and symptoms of complications - no redness or edema noted at insertion site, patient denies c/o pain - only slight tenderness at site.  Dressing with slight pressure applied.  D/c Instructions-Education: Discharge instructions given  to patient/family (daughter) with verbalized understanding. D/c education completed with patient/family (Daugther) including follow up instructions, medication list, d/c activities limitations if indicated, with other d/c instructions as indicated by MD - patient able to verbalize understanding, all questions fully answered. Patient instructed to return to ED, call 911, or call MD for any changes in condition.  Patient escorted via [place], and D/C home via private auto.  Dorita Fray, RN 08/30/2015 4:28 PM

## 2015-09-04 DIAGNOSIS — Z4789 Encounter for other orthopedic aftercare: Secondary | ICD-10-CM | POA: Diagnosis not present

## 2015-09-04 DIAGNOSIS — Z96652 Presence of left artificial knee joint: Secondary | ICD-10-CM | POA: Diagnosis not present

## 2015-09-04 DIAGNOSIS — G8929 Other chronic pain: Secondary | ICD-10-CM | POA: Diagnosis not present

## 2015-09-04 DIAGNOSIS — I10 Essential (primary) hypertension: Secondary | ICD-10-CM | POA: Diagnosis not present

## 2015-09-04 DIAGNOSIS — E119 Type 2 diabetes mellitus without complications: Secondary | ICD-10-CM | POA: Diagnosis not present

## 2015-09-04 DIAGNOSIS — J45909 Unspecified asthma, uncomplicated: Secondary | ICD-10-CM | POA: Diagnosis not present

## 2015-09-05 DIAGNOSIS — E119 Type 2 diabetes mellitus without complications: Secondary | ICD-10-CM | POA: Diagnosis not present

## 2015-09-05 DIAGNOSIS — Z4789 Encounter for other orthopedic aftercare: Secondary | ICD-10-CM | POA: Diagnosis not present

## 2015-09-05 DIAGNOSIS — I10 Essential (primary) hypertension: Secondary | ICD-10-CM | POA: Diagnosis not present

## 2015-09-05 DIAGNOSIS — Z96652 Presence of left artificial knee joint: Secondary | ICD-10-CM | POA: Diagnosis not present

## 2015-09-05 DIAGNOSIS — G8929 Other chronic pain: Secondary | ICD-10-CM | POA: Diagnosis not present

## 2015-09-05 DIAGNOSIS — J45909 Unspecified asthma, uncomplicated: Secondary | ICD-10-CM | POA: Diagnosis not present

## 2015-09-07 DIAGNOSIS — Z4789 Encounter for other orthopedic aftercare: Secondary | ICD-10-CM | POA: Diagnosis not present

## 2015-09-07 DIAGNOSIS — J45909 Unspecified asthma, uncomplicated: Secondary | ICD-10-CM | POA: Diagnosis not present

## 2015-09-07 DIAGNOSIS — Z96652 Presence of left artificial knee joint: Secondary | ICD-10-CM | POA: Diagnosis not present

## 2015-09-07 DIAGNOSIS — I10 Essential (primary) hypertension: Secondary | ICD-10-CM | POA: Diagnosis not present

## 2015-09-07 DIAGNOSIS — E119 Type 2 diabetes mellitus without complications: Secondary | ICD-10-CM | POA: Diagnosis not present

## 2015-09-07 DIAGNOSIS — G8929 Other chronic pain: Secondary | ICD-10-CM | POA: Diagnosis not present

## 2015-09-10 DIAGNOSIS — G8929 Other chronic pain: Secondary | ICD-10-CM | POA: Diagnosis not present

## 2015-09-10 DIAGNOSIS — I10 Essential (primary) hypertension: Secondary | ICD-10-CM | POA: Diagnosis not present

## 2015-09-10 DIAGNOSIS — Z96652 Presence of left artificial knee joint: Secondary | ICD-10-CM | POA: Diagnosis not present

## 2015-09-10 DIAGNOSIS — Z4789 Encounter for other orthopedic aftercare: Secondary | ICD-10-CM | POA: Diagnosis not present

## 2015-09-10 DIAGNOSIS — E119 Type 2 diabetes mellitus without complications: Secondary | ICD-10-CM | POA: Diagnosis not present

## 2015-09-10 DIAGNOSIS — J45909 Unspecified asthma, uncomplicated: Secondary | ICD-10-CM | POA: Diagnosis not present

## 2015-09-11 NOTE — Progress Notes (Signed)
OT Note - Addendum    Sep 24, 2015 1551  OT Visit Information  Last OT Received On 09/24/15  OT G-codes **NOT FOR INPATIENT CLASS**  Functional Assessment Tool Used clinical judgement  Functional Limitation Self care  Self Care Current Status [phone]) CI  Self Care Goal Status [phone]) CI  [facility], OTR/L  [phone] 09/11/2015

## 2015-09-12 DIAGNOSIS — J45909 Unspecified asthma, uncomplicated: Secondary | ICD-10-CM | POA: Diagnosis not present

## 2015-09-12 DIAGNOSIS — G8929 Other chronic pain: Secondary | ICD-10-CM | POA: Diagnosis not present

## 2015-09-12 DIAGNOSIS — I10 Essential (primary) hypertension: Secondary | ICD-10-CM | POA: Diagnosis not present

## 2015-09-12 DIAGNOSIS — Z96652 Presence of left artificial knee joint: Secondary | ICD-10-CM | POA: Diagnosis not present

## 2015-09-12 DIAGNOSIS — Z4789 Encounter for other orthopedic aftercare: Secondary | ICD-10-CM | POA: Diagnosis not present

## 2015-09-12 DIAGNOSIS — E119 Type 2 diabetes mellitus without complications: Secondary | ICD-10-CM | POA: Diagnosis not present

## 2015-09-13 NOTE — Discharge Summary (Signed)
Physician Discharge Summary  Patient ID: Tabitha Oneal MRN: [id number] DOB/AGE: [birth year] 60 y.o.  Admit date: 08/28/2015 Discharge date: 08/30/2015  Admission Diagnoses:  Active Problems:   Arthrofibrosis of knee joint   Discharge Diagnoses:  Same  Surgeries: Procedure(s): LEFT KNEE ARTHROFIBROSIS EXCISION; pylectomy on 08/28/2015   Consultants:    Discharged Condition: Stable  Hospital Course: Tabitha Oneal is an 60 y.o. female who was admitted 08/28/2015 with a chief complaint of left knee arthrofibrosis, and found to have a diagnosis of stiff and painful arthrofibrotic knee.  They were brought to the operating room on 08/28/2015 and underwent the above named procedures.  Pain was controlled by discharge - sent home with CPM and HHPT  Antibiotics given:  Anti-infectives    Start     Dose/Rate Route Frequency Ordered Stop   08/28/15 2200  ceFAZolin (ANCEF) IVPB 2g/100 mL premix     2 g 200 mL/hr over 30 Minutes Intravenous Every 6 hours 08/28/15 2117 08/29/15 0335   08/28/15 1235  ceFAZolin (ANCEF) 2-4 GM/100ML-% IVPB    Comments:  Nyoka Cowden   : cabinet override      08/28/15 1235 08/28/15 1458   08/16/15 0800  ceFAZolin (ANCEF) IVPB 2g/100 mL premix     2 g over 30 Minutes Intravenous To [facility] 08/15/15 1339 08/17/15 0800    .  Recent vital signs:  Filed Vitals:   08/29/15 2140 08/30/15 0628  BP: 154/68 148/71  Pulse: 102 84  Temp: 100.2 F (37.9 C) 98.2 F (36.8 C)  Resp: 18 18    Recent laboratory studies:  Results for orders placed or performed during the hospital encounter of 08/28/15  Glucose, capillary  Result Value Ref Range   Glucose-Capillary 110 (H) 65 - 99 mg/dL  Glucose, capillary  Result Value Ref Range   Glucose-Capillary 91 65 - 99 mg/dL    Discharge Medications:     Medication List    TAKE these medications        albuterol 108 (90 Base) MCG/ACT inhaler  Commonly known as:  PROVENTIL HFA;VENTOLIN HFA   Inhale 2 puffs into the lungs every 4 (four) hours as needed. For wheezing     AMITIZA 24 MCG capsule  Generic drug:  lubiprostone  Take 1 capsule (24 mcg total) by mouth 2 (two) times daily with a meal.     aspirin-acetaminophen-caffeine 250-250-65 MG tablet  Commonly known as:  EXCEDRIN MIGRAINE  Take 2 tablets by mouth daily.     atorvastatin 40 MG tablet  Commonly known as:  LIPITOR  Take 1 tablet (40 mg total) by mouth daily.     azelastine 0.1 % nasal spray  Commonly known as:  ASTELIN  Place 1 spray into the nose 2 (two) times daily as needed for allergies. Use in each nostril as directed     baclofen 10 MG tablet  Commonly known as:  LIORESAL  Take 10 mg by mouth. 1 in morning 2 at bed time     beclomethasone 80 MCG/ACT inhaler  Commonly known as:  QVAR  Inhale 2 puffs into the lungs 2 (two) times daily.     diclofenac sodium 1 % Gel  Commonly known as:  VOLTAREN  Apply 8 g topically 2 (two) times daily as needed. On knees     loratadine 10 MG tablet  Commonly known as:  CLARITIN  Take 1 tablet (10 mg total) by mouth daily.     metFORMIN 500 MG tablet  Commonly  known as:  GLUCOPHAGE  Take one tablet by mouth twice daily to control blood sugar     montelukast 10 MG tablet  Commonly known as:  SINGULAIR  TAKE 1 TABLET BY MOUTH AT BEDTIME     omeprazole 40 MG capsule  Commonly known as:  PRILOSEC  Take 1 capsule (40 mg total) by mouth daily.     ONE TOUCH ULTRA TEST test strip  Generic drug:  glucose blood  Check blood sugar once daily as directed     Oxycodone HCl 10 MG Tabs  Take 1 tablet (10 mg total) by mouth 4 (four) times daily as needed.     oxyCODONE 10 mg 12 hr tablet  Commonly known as:  OXYCONTIN  Take 1 tablet (10 mg total) by mouth every 12 (twelve) hours.     quinapril 20 MG tablet  Commonly known as:  ACCUPRIL  TAKE 1 TABLET BY MOUTH AT BEDTIME     TRADJENTA 5 MG Tabs tablet  Generic drug:  linagliptin  TAKE 1 TABLET EVERY DAY         Diagnostic Studies: No results found.  Disposition: 01-Home or Self Care      Discharge Instructions    Call MD / Call 911    Complete by:  As directed   If you experience chest pain or shortness of breath, CALL 911 and be transported to the hospital emergency room.  If you develope a fever above 101 F, pus (white drainage) or increased drainage or redness at the wound, or calf pain, call your surgeon's office.     Constipation Prevention    Complete by:  As directed   Drink plenty of fluids.  Prune juice may be helpful.  You may use a stool softener, such as Colace (over the counter) 100 mg twice a day.  Use MiraLax (over the counter) for constipation as needed.     Diet - low sodium heart healthy    Complete by:  As directed      Discharge instructions    Complete by:  As directed   Weight bearing as tolerated in knee immobilizer CPM 6 hours per day minimum Ok to shower with dressing     Increase activity slowly as tolerated    Complete by:  As directed               Signed: Rodricus Candelaria SCOTT 09/13/2015, 8:08 AM

## 2015-09-14 DIAGNOSIS — G8929 Other chronic pain: Secondary | ICD-10-CM | POA: Diagnosis not present

## 2015-09-14 DIAGNOSIS — Z4789 Encounter for other orthopedic aftercare: Secondary | ICD-10-CM | POA: Diagnosis not present

## 2015-09-14 DIAGNOSIS — Z96652 Presence of left artificial knee joint: Secondary | ICD-10-CM | POA: Diagnosis not present

## 2015-09-14 DIAGNOSIS — J45909 Unspecified asthma, uncomplicated: Secondary | ICD-10-CM | POA: Diagnosis not present

## 2015-09-14 DIAGNOSIS — E119 Type 2 diabetes mellitus without complications: Secondary | ICD-10-CM | POA: Diagnosis not present

## 2015-09-14 DIAGNOSIS — I10 Essential (primary) hypertension: Secondary | ICD-10-CM | POA: Diagnosis not present

## 2015-09-17 DIAGNOSIS — M545 Low back pain: Secondary | ICD-10-CM | POA: Diagnosis not present

## 2015-09-17 DIAGNOSIS — G894 Chronic pain syndrome: Secondary | ICD-10-CM | POA: Diagnosis not present

## 2015-09-17 DIAGNOSIS — M25562 Pain in left knee: Secondary | ICD-10-CM | POA: Diagnosis not present

## 2015-09-17 DIAGNOSIS — Z79891 Long term (current) use of opiate analgesic: Secondary | ICD-10-CM | POA: Diagnosis not present

## 2015-09-20 ENCOUNTER — Other Ambulatory Visit: Payer: Self-pay | Admitting: *Deleted

## 2015-09-20 MED ORDER — MONTELUKAST SODIUM 10 MG PO TABS: 10.0000 mg | ORAL_TABLET | Freq: Every day | ORAL | Status: DC

## 2015-09-20 NOTE — Telephone Encounter (Signed)
[facility] 

## 2015-09-24 ENCOUNTER — Ambulatory Visit: Payer: Medicare Other | Attending: Orthopedic Surgery | Admitting: Physical Therapy

## 2015-09-24 DIAGNOSIS — M25662 Stiffness of left knee, not elsewhere classified: Secondary | ICD-10-CM

## 2015-09-24 DIAGNOSIS — R262 Difficulty in walking, not elsewhere classified: Secondary | ICD-10-CM

## 2015-09-24 DIAGNOSIS — M79605 Pain in left leg: Secondary | ICD-10-CM | POA: Insufficient documentation

## 2015-09-24 DIAGNOSIS — R269 Unspecified abnormalities of gait and mobility: Secondary | ICD-10-CM | POA: Diagnosis not present

## 2015-09-24 DIAGNOSIS — M6281 Muscle weakness (generalized): Secondary | ICD-10-CM | POA: Diagnosis not present

## 2015-09-24 NOTE — Therapy (Signed)
[place], [place], [address] Phone: [phone]   Fax:  [phone]  Physical Therapy Evaluation  Patient Details  Name: Tabitha Oneal MRN: [id number] Date of Birth: 1955-09-29 Referring Provider: Marlou Sa   Encounter Date: 09/24/2015      PT End of Session - 09/24/15 2154    Visit Number 1   Number of Visits 24   Date for PT Re-Evaluation 11/19/15   PT Start Time 1114  pt late, doing FOTO   PT Stop Time 1200   PT Time Calculation (min) 46 min   Activity Tolerance Patient limited by pain   Behavior During Therapy [facility] for tasks assessed/performed      Past Medical History  Diagnosis Date  . Osteoarthritis of left knee 02/23/2012  . Hard of hearing   . Asthma     QVAR daily and Albuterol as needed  . GERD (gastroesophageal reflux disease)     takes Omeprazole daily  . Seasonal allergies     takes Claritin and Singulair daily.Nasal spray as needed  . Hyperlipidemia     takes Atorvastatin daily  . Diabetes mellitus     takes Metformin and Tradjenta daily  . Hypertension     takes Quinapril daily  . Bladder infection     taking Keflex daily   . Shortness of breath dyspnea     with exertion  . Headache     daily.Takes Excedrine daily  . Joint pain   . Joint swelling   . Chronic back pain     reason unknown  . Nocturia   . History of blood transfusion     no abnormal reaction noted    Past Surgical History  Procedure Laterality Date  . Joint replacement      acl   bil knees  . Abdominal hysterectomy    . Total knee arthroplasty  02/23/2012    Procedure: TOTAL KNEE ARTHROPLASTY;  Surgeon: Johnny Bridge, MD;  Location: [place];  Service: Orthopedics;  Laterality: Left;  . Knee closed reduction  04/09/2012    Procedure: CLOSED MANIPULATION KNEE;  Surgeon: Johnny Bridge, MD;  Location: [place];  Service: Orthopedics;  Laterality: Left;  Manipulation Knee with  Anesthesia includes Application of Traction   . Injection knee  04/09/2012    Procedure: KNEE INJECTION;  Surgeon: Johnny Bridge, MD;  Location: [place];  Service: Orthopedics;  Laterality: Left;  . Colonoscopy    . Knee arthrotomy Left 08/28/2015    Procedure: LEFT KNEE ARTHROFIBROSIS EXCISION; pylectomy;  Surgeon: Meredith Pel, MD;  Location: [place];  Service: Orthopedics;  Laterality: Left;    There were no vitals filed for this visit.       Subjective Assessment - 09/24/15 1121    Subjective Pt with history of TKA approx. 4 yrs ago., now with 3rd. surgery to improve L knee AROM.  OP note states she has essentially had 10 deg of motion just prior to surgery.  She has been using a CPM.  HHPT for a couple of weeks.  She has difficulty with all aspects of functional mobility, including walking, sitting normally, she is unable to negotiate stairs and community mobility without assistance.     Pertinent History chronic knee arthrofibrosis, diabetes, HTN, HOH   Limitations Sitting;Lifting;Walking;Standing;House hold activities   Diagnostic tests post op XR    Patient Stated Goals walk better   Currently in Pain? Yes  Pain Score 6   premedicated   Pain Location Knee   Pain Orientation Left;Anterior   Pain Descriptors / Indicators Aching;Sharp;Sore;Throbbing   Pain Type Surgical pain;Chronic pain   Pain Radiating Towards sometimes down shin to foot   Pain Onset 1 to 4 weeks ago   Pain Frequency Constant   Aggravating Factors  bending knee, activity, walking    Pain Relieving Factors meds, rest, ice    Effect of Pain on Daily Activities makes mobility very challenging   Multiple Pain Sites No            [facility] PT Assessment - 09/24/15 1125    Assessment   Medical Diagnosis L knee arthrfibrosis s/p manipulation and patellectomy   Referring Provider Dean    Onset Date/Surgical Date 08/28/15   Prior Therapy Yes   Precautions   Precautions None   Required  Braces or Orthoses Knee Immobilizer - Left   Knee Immobilizer - Left On when out of bed or walking   Restrictions   Weight Bearing Restrictions No   Balance Screen   Has the patient fallen in the past 6 months No   [place] residence   [place] Access Level entry   Prior Function   Level of Independence Independent   Cognition   Overall Cognitive Status Within Functional Limits for tasks assessed   Observation/Other Assessments   Focus on Therapeutic Outcomes (FOTO)  76%   Observation/Other Assessments-Edema    Edema Circumferential   Circumferential Edema   Circumferential - Left  18 5/8 inch   Sensation   Light Touch Impaired by gross assessment   Additional Comments min numbness lateral knee L     Coordination   Gross Motor Movements are Fluid and Coordinated Not tested   AROM   Left Knee Extension 20   Left Knee Flexion 36   PROM   Overall PROM  --  40 deg. L knee   Strength   Right Hip Flexion 4/5   Right Hip ABduction 4-/5   Left Hip Flexion 4-/5   Left Hip ABduction 3+/5   Left Knee Flexion 3+/5  diff to test due to limited ROM    Left Knee Extension 1/5   Palpation   Patella mobility absent, patallectomy   Palpation comment warm well healed scar, tender to prox shin   Ambulation/Gait   Ambulation/Gait Yes   Ambulation/Gait Assistance 6: Modified independent (Device/Increase time)   Ambulation Distance (Feet) 150 Feet   Assistive device Rolling walker   Gait Pattern Decreased step length - right;Decreased step length - left;Decreased stance time - left;Decreased hip/knee flexion - left;Left flexed knee in stance;Antalgic   Ambulation Surface Level;Indoor                   [facility] Adult PT Treatment/Exercise - 09/24/15 1125    Knee/Hip Exercises: Stretches   Knee: Self-Stretch to increase Flexion Left;5 reps   Knee: Self-Stretch Limitations used sheet in hookying and with hip at 90 deg,  low tolerance for flexion by PT/PROM   Vasopneumatic   Number Minutes Vasopneumatic  15 minutes   Vasopnuematic Location  Knee   Vasopneumatic Pressure Low   Vasopneumatic Temperature  32 Deg                PT Education - 09/24/15 2154    Education provided Yes   Education Details Daily AAROM for HEP, PT/POC   Person(s) Educated Patient  Methods Explanation;Verbal cues   Comprehension Verbalized understanding;Returned demonstration          PT Short Term Goals - 10/15/2015 09/15/04    PT SHORT TERM GOAL #1   Title Pt will be I with initial HEP to continue care at home for max outcome   Time 4   Period Weeks   Status New   PT SHORT TERM GOAL #2   Title Pt will be able to demo 25 deg AROM in L knee flex   Time 4   Period Weeks   Status New   PT SHORT TERM GOAL #3   Title Pt will be able to amb x 20 min with 25% less difficulty for shopping, community mobility   Time 4   Period Weeks   Status New           PT Long Term Goals - 2015/10/15 September 16, 2210    PT LONG TERM GOAL #1   Title Pt will report 75% reduction in L LE pain with functional mobility   Time 8   Period Weeks   Status New   PT LONG TERM GOAL #2   Title Pt will be able to flex L knee to 45 deg in sitting for ease of transfers   Time 8   Period Weeks   Status New   PT LONG TERM GOAL #3   Title Pt will be able to extend her knee seated (LAQ) and resist min pressure (3+/5) for gait, transfers.    Time 8   Period Weeks   Status New   PT LONG TERM GOAL #4   Title Pt will score 60% or less impaired on FOTO to demo improved function.    Time 8   Period Weeks   Status New               Plan - 2015-10-15 15-Sep-2153    Clinical Impression Statement Ms. Brescia presents for mod complexity eval for Lt knee arthrofibrosis and surgical manipulation. She has severe limitations in ROM of L knee.  She will benefit from intense PT but will need to show functional improvement in order to  continue after 12 visits.     Rehab Potential Fair   PT Frequency 3x / week  3 times a week if progressing    PT Duration 8 weeks   PT Treatment/Interventions ADLs/Self Care Home Management;Cryotherapy;Electrical Stimulation;Therapeutic exercise;Manual techniques;Vasopneumatic Device;Therapeutic activities;Taping;Functional mobility training;Gait training;Patient/family education;Passive range of motion;Scar mobilization;DME Instruction;Neuromuscular re-education;Balance training   PT Next Visit Plan AAROM, strengthening   PT Home Exercise Plan AAROM for knee flexion, has had PT prior, no handouts given on eval   Consulted and Agree with Plan of Care Patient      Patient will benefit from skilled therapeutic intervention in order to improve the following deficits and impairments:  Abnormal gait, Decreased range of motion, Difficulty walking, Increased fascial restricitons, Obesity, Decreased activity tolerance, Pain, Decreased skin integrity, Decreased balance, Decreased scar mobility, Hypomobility, Impaired flexibility, Improper body mechanics, Impaired sensation, Increased edema, Decreased strength, Decreased mobility  Visit Diagnosis: Stiffness of left knee, not elsewhere classified  Difficulty in walking, not elsewhere classified  Muscle weakness (generalized)      G-Codes - 2015-10-15 09/15/17    Functional Assessment Tool Used FOTO    Functional Limitation Mobility: Walking and moving around   Mobility: Walking and Moving Around Current Status [phone]) At least 60 percent but less than 80 percent impaired, limited or restricted   Mobility: Walking and Moving  Around Goal Status [phone]) At least 40 percent but less than 60 percent impaired, limited or restricted       Problem List Patient Active Problem List   Diagnosis Date Noted  . Arthrofibrosis of knee joint 08/28/2015  . Left hip pain 10/08/2014  . Type 2 diabetes mellitus without complication ([place]) [id number]  . Hyperlipidemia LDL goal <100 10/08/2014  .  Essential hypertension, benign 08/30/2014  . Diabetes mellitus, type 2 ([employer]) 08/30/2014  . Diverticulosis of colon without hemorrhage 08/30/2014  . Chronic low back pain 08/30/2014  . Chronic pain syndrome 08/30/2014  . Asthma in adult without complication [id number]  . 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 Osteoarthritis of left knee 02/23/2012    PAA,JENNIFER 09/24/2015, 10:22 PM  [facility] [address] [place], [place], [address] Phone: [phone]   Fax:  [phone]  Name: Tabitha Oneal MRN: [id number] Date of Birth: [birth year]   Raeford Razor, PT 09/24/2015 10:22 PM Phone: [phone] Fax: [phone]

## 2015-09-27 ENCOUNTER — Encounter: Payer: Self-pay | Admitting: Allergy and Immunology

## 2015-09-27 ENCOUNTER — Ambulatory Visit (INDEPENDENT_AMBULATORY_CARE_PROVIDER_SITE_OTHER): Payer: Medicare Other | Admitting: Allergy and Immunology

## 2015-09-27 VITALS — BP 120/72 | HR 76 | Temp 98.3°F | Resp 20

## 2015-09-27 DIAGNOSIS — J309 Allergic rhinitis, unspecified: Secondary | ICD-10-CM

## 2015-09-27 DIAGNOSIS — J4530 Mild persistent asthma, uncomplicated: Secondary | ICD-10-CM

## 2015-09-27 DIAGNOSIS — H1010 Acute atopic conjunctivitis, unspecified eye: Secondary | ICD-10-CM | POA: Diagnosis not present

## 2015-09-27 NOTE — Progress Notes (Signed)
     FOLLOW UP NOTE  RE: Tabitha Oneal MRN: [id number] DOB: 05/10/1956 [facility] [place] [address] [place] [address] Date of Office Visit: 09/27/2015  Subjective:  Tabitha Oneal is a 60 y.o. female who presents today for Follow-up and Wheezing  Assessment:   1. Mild persistent asthma.  2. Allergic rhinoconjunctivitis, intermittent symptoms.   3.      Complex medical history. Plan:  1.  Use Azelastine one spray twice daily, consistently. 2.  Use QVAR 2 puffs twice daily, rinse, gargle and spit with water. 3.  Continue Zyrtec and Singulair daily. 4.  Saline nasal wash each evening at bath time. 5.  Follow-up in 2 months or sooner if needed. 6.  Review with physical therapy questions as discussed today.  HPI: Tabitha Oneal returns to the office in follow-up of allergic rhinoconjunctivitis and asthma, last visit in November 2016.  She does find her medications beneficial, but often minimizes them when she is doing well.  She had an additional knee operation on the left approximately one month ago and has been receiving physical therapy.  She describes approximately 2 weeks postoperatively a single episode of wheezing and used Albuterol at home without persisting symptoms, questions or concerns, but it appears her symptoms may have been throat congestion/postnasal drip, and noisy nasal breathing.  She denies any recurring difficulty in breathing, shortness of breath, chest tightness, discolored drainage, headache, sore throat or fever.  She is only using Astelin twice weekly in Qvar once daily. Denies ED or urgent care visits, prednisone or antibiotic courses. Reports sleep and activity are normal.  Tabitha Oneal has a current medication list which includes the following prescription(s): albuterol, amitiza, aspirin-acetaminophen-caffeine, atorvastatin, azelastine, baclofen, beclomethasone, diclofenac sodium, loratadine, metformin, montelukast, omeprazole,  one touch ultra test, oxycodone, oxycodone hcl, quinapril, and tradjenta.   Drug Allergies: No Known Allergies  Objective:   Filed Vitals:   09/27/15 1051  BP: 120/72  Pulse: 76  Temp: 98.3 F (36.8 C)  Resp: 20   SpO2 Readings from Last 1 Encounters:  09/27/15 98%   Physical Exam  Constitutional: She is well-developed, well-nourished, and in no distress.  HENT:  Head: Atraumatic.  Right Ear: Tympanic membrane and ear canal normal.  Left Ear: Tympanic membrane and ear canal normal.  Nose: Mucosal edema (Slight pallor bilaterally.) present. No rhinorrhea. No epistaxis.  Mouth/Throat: Oropharynx is clear and moist and mucous membranes are normal. No oropharyngeal exudate, posterior oropharyngeal edema or posterior oropharyngeal erythema.  Neck: Neck supple.  Cardiovascular: Normal rate, S1 normal and S2 normal.   No murmur heard. Pulmonary/Chest: Effort normal. She has no wheezes. She has no rhonchi. She has no rales.  Lymphadenopathy:    She has no cervical adenopathy.   Diagnostics: Spirometry:  FVC 2.30--87%, FEV1 1.78--84%.    Roselyn M. Ishmael Holter, MD  cc: Gildardo Cranker, DO

## 2015-09-27 NOTE — Patient Instructions (Signed)
    Use Azelastine one spray twice daily.  Use QVAR 2 puffs twice daily, rinse, gargle and spit with water.  Continue Zyrtec and Singulair daily.  Saline nasal wash each evening at bath time.  Follow-up in 2 months or sooner if needed.  Review with physical therapy questions as discussed today.

## 2015-10-01 ENCOUNTER — Other Ambulatory Visit: Payer: Self-pay | Admitting: *Deleted

## 2015-10-01 ENCOUNTER — Ambulatory Visit: Payer: Medicare Other | Admitting: Physical Therapy

## 2015-10-01 DIAGNOSIS — R262 Difficulty in walking, not elsewhere classified: Secondary | ICD-10-CM | POA: Diagnosis not present

## 2015-10-01 DIAGNOSIS — R269 Unspecified abnormalities of gait and mobility: Secondary | ICD-10-CM

## 2015-10-01 DIAGNOSIS — M25662 Stiffness of left knee, not elsewhere classified: Secondary | ICD-10-CM

## 2015-10-01 DIAGNOSIS — M79605 Pain in left leg: Secondary | ICD-10-CM

## 2015-10-01 DIAGNOSIS — M6281 Muscle weakness (generalized): Secondary | ICD-10-CM

## 2015-10-01 MED ORDER — MONTELUKAST SODIUM 10 MG PO TABS: 10.0000 mg | ORAL_TABLET | Freq: Every day | ORAL | Status: DC

## 2015-10-01 NOTE — Telephone Encounter (Signed)
[facility] 

## 2015-10-01 NOTE — Therapy (Signed)
[place], [place], [address] Phone: [phone]   Fax:  [phone]  Physical Therapy Treatment  Patient Details  Name: Tabitha Oneal MRN: [id number] Date of Birth: 1956/02/13 Referring Provider: Marlou Sa   Encounter Date: 10/01/2015      PT End of Session - 10/01/15 1432    Visit Number 2   Number of Visits 24   Date for PT Re-Evaluation 11/19/15   PT Start Time 1027  10 minutes late   PT Stop Time 1100   PT Time Calculation (min) 33 min      Past Medical History  Diagnosis Date  . Osteoarthritis of left knee 02/23/2012  . Hard of hearing   . Asthma     QVAR daily and Albuterol as needed  . GERD (gastroesophageal reflux disease)     takes Omeprazole daily  . Seasonal allergies     takes Claritin and Singulair daily.Nasal spray as needed  . Hyperlipidemia     takes Atorvastatin daily  . Diabetes mellitus     takes Metformin and Tradjenta daily  . Hypertension     takes Quinapril daily  . Bladder infection     taking Keflex daily   . Shortness of breath dyspnea     with exertion  . Headache     daily.Takes Excedrine daily  . Joint pain   . Joint swelling   . Chronic back pain     reason unknown  . Nocturia   . History of blood transfusion     no abnormal reaction noted    Past Surgical History  Procedure Laterality Date  . Joint replacement      acl   bil knees  . Abdominal hysterectomy    . Total knee arthroplasty  02/23/2012    Procedure: TOTAL KNEE ARTHROPLASTY;  Surgeon: Johnny Bridge, MD;  Location: [place];  Service: Orthopedics;  Laterality: Left;  . Knee closed reduction  04/09/2012    Procedure: CLOSED MANIPULATION KNEE;  Surgeon: Johnny Bridge, MD;  Location: [place];  Service: Orthopedics;  Laterality: Left;  Manipulation Knee with Anesthesia includes Application of Traction   . Injection knee  04/09/2012    Procedure: KNEE INJECTION;  Surgeon:  Johnny Bridge, MD;  Location: [place];  Service: Orthopedics;  Laterality: Left;  . Colonoscopy    . Knee arthrotomy Left 08/28/2015    Procedure: LEFT KNEE ARTHROFIBROSIS EXCISION; pylectomy;  Surgeon: Meredith Pel, MD;  Location: [place];  Service: Orthopedics;  Laterality: Left;    There were no vitals filed for this visit.                       [facility] Adult PT Treatment/Exercise - 10/01/15 0001    Self-Care   Self-Care Scar Mobilizations   Scar Mobilizations Instructed pt in self scar massage and desensitiation massage to decrease severe tenderness in anterior knee.    Knee/Hip Exercises: Stretches   Other Knee/Hip Stretches standing left foot forward lunge stretch  3 x 30 sec, pre and post manual   Manual Therapy   Manual Therapy Myofascial release   Myofascial Release IASTM to left middle to distal quad                  PT Short Term Goals - 09/24/15 2206    PT SHORT TERM GOAL #1   Title Pt will be I with initial HEP  to continue care at home for max outcome   Time 4   Period Weeks   Status New   PT SHORT TERM GOAL #2   Title Pt will be able to demo 25 deg AROM in L knee flex   Time 4   Period Weeks   Status New   PT SHORT TERM GOAL #3   Title Pt will be able to amb x 20 min with 25% less difficulty for shopping, community mobility   Time 4   Period Weeks   Status New           PT Long Term Goals - 09/24/15 2212    PT LONG TERM GOAL #1   Title Pt will report 75% reduction in L LE pain with functional mobility   Time 8   Period Weeks   Status New   PT LONG TERM GOAL #2   Title Pt will be able to flex L knee to 45 deg in sitting for ease of transfers   Time 8   Period Weeks   Status New   PT LONG TERM GOAL #3   Title Pt will be able to extend her knee seated (LAQ) and resist min pressure (3+/5) for gait, transfers.    Time 8   Period Weeks   Status New   PT LONG TERM GOAL #4   Title Pt will score 60% or  less impaired on FOTO to demo improved function.    Time 8   Period Weeks   Status New               Plan - 10/01/15 1435    Clinical Impression Statement Manual techniques used to decrease tenderness and improved ROM. Pt flexion ROM improved from 32 degrees to 40 after treatment measured in seated. Pt reports pain decreased after treatment.    PT Next Visit Plan AAROM, strengthening, manu/soft tissue /PROM      Patient will benefit from skilled therapeutic intervention in order to improve the following deficits and impairments:  Abnormal gait, Decreased range of motion, Difficulty walking, Increased fascial restricitons, Obesity, Decreased activity tolerance, Pain, Decreased skin integrity, Decreased balance, Decreased scar mobility, Hypomobility, Impaired flexibility, Improper body mechanics, Impaired sensation, Increased edema, Decreased strength, Decreased mobility  Visit Diagnosis: Stiffness of left knee, not elsewhere classified  Difficulty in walking, not elsewhere classified  Muscle weakness (generalized)  Pain of left lower extremity  Abnormality of gait     Problem List Patient Active Problem List   Diagnosis Date Noted  . Arthrofibrosis of knee joint 08/28/2015  . Left hip pain 10/08/2014  . Type 2 diabetes mellitus without complication ([place]) [id number]  . Hyperlipidemia LDL goal <100 10/08/2014  . Essential hypertension, benign 08/30/2014  . Diabetes mellitus, type 2 ([place]) 08/30/2014  . Diverticulosis of colon without hemorrhage 08/30/2014  . Chronic low back pain 08/30/2014  . Chronic pain syndrome 08/30/2014  . Asthma in adult without complication [id number]  . 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 Osteoarthritis of left knee 02/23/2012    Dorene Ar, PTA 10/01/2015, 2:38 PM  [facility] [address] [place], [place], [address] Phone: [phone]   Fax:   [phone]  Name: Tabitha Oneal MRN: [id number] Date of Birth: 05-21-1956

## 2015-10-03 ENCOUNTER — Ambulatory Visit: Payer: Medicare Other | Admitting: Physical Therapy

## 2015-10-03 DIAGNOSIS — M25662 Stiffness of left knee, not elsewhere classified: Secondary | ICD-10-CM | POA: Diagnosis not present

## 2015-10-03 DIAGNOSIS — R269 Unspecified abnormalities of gait and mobility: Secondary | ICD-10-CM

## 2015-10-03 DIAGNOSIS — R262 Difficulty in walking, not elsewhere classified: Secondary | ICD-10-CM

## 2015-10-03 DIAGNOSIS — M6281 Muscle weakness (generalized): Secondary | ICD-10-CM

## 2015-10-03 DIAGNOSIS — M79605 Pain in left leg: Secondary | ICD-10-CM

## 2015-10-03 NOTE — Therapy (Signed)
[place], [place], [address] Phone: [phone]   Fax:  [phone]  Physical Therapy Treatment  Patient Details  Name: Tabitha Oneal MRN: [id number] Date of Birth: 18-Jun-1955 Referring Provider: Marlou Sa   Encounter Date: 10/03/2015      PT End of Session - 10/03/15 1237    Visit Number 3   Number of Visits 24   Date for PT Re-Evaluation 11/19/15   PT Start Time 1030  late check in   PT Stop Time 1115   PT Time Calculation (min) 45 min   Activity Tolerance Patient tolerated treatment well   Behavior During Therapy [facility] for tasks assessed/performed      Past Medical History  Diagnosis Date  . Osteoarthritis of left knee 02/23/2012  . Hard of hearing   . Asthma     QVAR daily and Albuterol as needed  . GERD (gastroesophageal reflux disease)     takes Omeprazole daily  . Seasonal allergies     takes Claritin and Singulair daily.Nasal spray as needed  . Hyperlipidemia     takes Atorvastatin daily  . Diabetes mellitus     takes Metformin and Tradjenta daily  . Hypertension     takes Quinapril daily  . Bladder infection     taking Keflex daily   . Shortness of breath dyspnea     with exertion  . Headache     daily.Takes Excedrine daily  . Joint pain   . Joint swelling   . Chronic back pain     reason unknown  . Nocturia   . History of blood transfusion     no abnormal reaction noted    Past Surgical History  Procedure Laterality Date  . Joint replacement      acl   bil knees  . Abdominal hysterectomy    . Total knee arthroplasty  02/23/2012    Procedure: TOTAL KNEE ARTHROPLASTY;  Surgeon: Johnny Bridge, MD;  Location: [place];  Service: Orthopedics;  Laterality: Left;  . Knee closed reduction  04/09/2012    Procedure: CLOSED MANIPULATION KNEE;  Surgeon: Johnny Bridge, MD;  Location: [place];  Service: Orthopedics;  Laterality: Left;  Manipulation Knee with  Anesthesia includes Application of Traction   . Injection knee  04/09/2012    Procedure: KNEE INJECTION;  Surgeon: Johnny Bridge, MD;  Location: [place];  Service: Orthopedics;  Laterality: Left;  . Colonoscopy    . Knee arthrotomy Left 08/28/2015    Procedure: LEFT KNEE ARTHROFIBROSIS EXCISION; pylectomy;  Surgeon: Meredith Pel, MD;  Location: [place];  Service: Orthopedics;  Laterality: Left;    There were no vitals filed for this visit.      Subjective Assessment - 10/03/15 1033    Subjective Hurts when I touch it and walk on it. Pt frustrated about her ride being late.    Currently in Pain? Yes   Pain Score 6    Pain Location Knee   Pain Orientation Left;Anterior   Pain Descriptors / Indicators Aching   Pain Type Chronic pain;Surgical pain   Pain Onset More than a month ago   Pain Frequency Constant                         [facility] Adult PT Treatment/Exercise - 10/03/15 1047    Ambulation/Gait   Ambulation/Gait Yes   Ambulation/Gait Assistance 3: Mod assist   Ambulation  Distance (Feet) 40 Feet   Assistive device 1 person hand held assist   Gait Pattern Decreased step length - left;Decreased hip/knee flexion - left;Left circumduction;Left hip hike   Ambulation Surface Level;Indoor   Knee/Hip Exercises: Stretches   Knee: Self-Stretch to increase Flexion 10 seconds;Other (comment)   Knee: Self-Stretch Limitations x 10 reps standing on step    Knee/Hip Exercises: Standing   Hip Abduction Stengthening;1 set;10 reps;Knee straight   Lateral Step Up 2 sets   Lateral Step Up Limitations 2- 4 inch    Forward Step Up Left;2 sets;10 reps;Hand Hold: 2   Forward Step Up Limitations 2-4 inch    Functional Squat 2 sets;10 reps   Vasopneumatic   Number Minutes Vasopneumatic  15 minutes   Vasopnuematic Location  Knee   Vasopneumatic Pressure Medium   Vasopneumatic Temperature  39 deg                PT Education - 10/03/15 1236     Education provided Yes   Education Details gait, knee control    Person(s) Educated Patient   Methods Explanation;Verbal cues   Comprehension Verbalized understanding;Returned demonstration;Need further instruction          PT Short Term Goals - 10/03/15 1246    PT SHORT TERM GOAL #1   Title Pt will be I with initial HEP to continue care at home for max outcome   Status On-going   PT SHORT TERM GOAL #2   Title Pt will be able to demo 25 deg AROM in L knee flex   Status On-going   PT SHORT TERM GOAL #3   Title Pt will be able to amb x 20 min with 25% less difficulty for shopping, community mobility   Status On-going           PT Long Term Goals - 10/03/15 1247    PT LONG TERM GOAL #1   Title Pt will report 75% reduction in L LE pain with functional mobility   Status On-going   PT LONG TERM GOAL #2   Title Pt will be able to flex L knee to 45 deg in sitting for ease of transfers   Status On-going   PT LONG TERM GOAL #3   Title Pt will be able to extend her knee seated (LAQ) and resist min pressure (3+/5) for gait, transfers.    Status On-going   PT LONG TERM GOAL #4   Title Pt will score 60% or less impaired on FOTO to demo improved function.    Status On-going               Plan - 10/03/15 1238    Clinical Impression Statement Pt worked on standing knee control today, strengthening and balance.  Needs cues for activation of appropriate mm, neuro -ed due to chronic disuse.  She was tired but pain remained moderate.  Needs Mod A for HHA with goal of "walking down the aisle" at her sister's wedding next month.    PT Next Visit Plan AAROM, strengthening, manu/soft tissue /PROM, try HHA for gait    PT Home Exercise Plan AAROM for knee flexion, has had PT prior, no handouts given on eval   Consulted and Agree with Plan of Care Patient      Patient will benefit from skilled therapeutic intervention in order to improve the following deficits and impairments:  Abnormal  gait, Decreased range of motion, Difficulty walking, Increased fascial restricitons, Obesity, Decreased activity tolerance, Pain, Decreased  skin integrity, Decreased balance, Decreased scar mobility, Hypomobility, Impaired flexibility, Improper body mechanics, Impaired sensation, Increased edema, Decreased strength, Decreased mobility  Visit Diagnosis: Stiffness of left knee, not elsewhere classified  Difficulty in walking, not elsewhere classified  Muscle weakness (generalized)  Pain of left lower extremity  Abnormality of gait     Problem List Patient Active Problem List   Diagnosis Date Noted  . Arthrofibrosis of knee joint 08/28/2015  . Left hip pain 10/08/2014  . Type 2 diabetes mellitus without complication ([place]) [id number]  . Hyperlipidemia LDL goal <100 10/08/2014  . Essential hypertension, benign 08/30/2014  . Diabetes mellitus, type 2 ([place]) 08/30/2014  . Diverticulosis of colon without hemorrhage 08/30/2014  . Chronic low back pain 08/30/2014  . Chronic pain syndrome 08/30/2014  . Asthma in adult without complication 123XX123  . 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 Osteoarthritis of left knee 02/23/2012    Bonita Brindisi 10/03/2015, 12:56 PM  [facility] [address] [place], [place], [address] Phone: [phone]   Fax:  [phone]  Name: Tabitha Oneal MRN: [id number] Date of Birth: 24-Nov-1955    Raeford Razor, PT 10/03/2015 12:56 PM Phone: [phone] Fax: [phone]

## 2015-10-08 ENCOUNTER — Ambulatory Visit: Payer: Medicare Other | Admitting: Physical Therapy

## 2015-10-08 DIAGNOSIS — R262 Difficulty in walking, not elsewhere classified: Secondary | ICD-10-CM

## 2015-10-08 DIAGNOSIS — M25662 Stiffness of left knee, not elsewhere classified: Secondary | ICD-10-CM | POA: Diagnosis not present

## 2015-10-08 DIAGNOSIS — R269 Unspecified abnormalities of gait and mobility: Secondary | ICD-10-CM | POA: Diagnosis not present

## 2015-10-08 DIAGNOSIS — M79605 Pain in left leg: Secondary | ICD-10-CM | POA: Diagnosis not present

## 2015-10-08 DIAGNOSIS — M6281 Muscle weakness (generalized): Secondary | ICD-10-CM | POA: Diagnosis not present

## 2015-10-08 NOTE — Therapy (Signed)
[place], [place], [address] Phone: [phone]   Fax:  [phone]  Physical Therapy Treatment  Patient Details  Name: Tabitha Oneal MRN: [id number] Date of Birth: 12/31/1955 Referring Provider: Marlou Sa   Encounter Date: 10/08/2015      PT End of Session - 10/08/15 1032    Visit Number 4   Number of Visits 24   Date for PT Re-Evaluation 11/19/15   PT Start Time 1025  10 min late   PT Stop Time 1115   PT Time Calculation (min) 50 min   Activity Tolerance Patient tolerated treatment well   Behavior During Therapy [facility] for tasks assessed/performed      Past Medical History  Diagnosis Date  . Osteoarthritis of left knee 02/23/2012  . Hard of hearing   . Asthma     QVAR daily and Albuterol as needed  . GERD (gastroesophageal reflux disease)     takes Omeprazole daily  . Seasonal allergies     takes Claritin and Singulair daily.Nasal spray as needed  . Hyperlipidemia     takes Atorvastatin daily  . Diabetes mellitus     takes Metformin and Tradjenta daily  . Hypertension     takes Quinapril daily  . Bladder infection     taking Keflex daily   . Shortness of breath dyspnea     with exertion  . Headache     daily.Takes Excedrine daily  . Joint pain   . Joint swelling   . Chronic back pain     reason unknown  . Nocturia   . History of blood transfusion     no abnormal reaction noted    Past Surgical History  Procedure Laterality Date  . Joint replacement      acl   bil knees  . Abdominal hysterectomy    . Total knee arthroplasty  02/23/2012    Procedure: TOTAL KNEE ARTHROPLASTY;  Surgeon: Johnny Bridge, MD;  Location: [place];  Service: Orthopedics;  Laterality: Left;  . Knee closed reduction  04/09/2012    Procedure: CLOSED MANIPULATION KNEE;  Surgeon: Johnny Bridge, MD;  Location: [place];  Service: Orthopedics;  Laterality: Left;  Manipulation Knee with  Anesthesia includes Application of Traction   . Injection knee  04/09/2012    Procedure: KNEE INJECTION;  Surgeon: Johnny Bridge, MD;  Location: [place];  Service: Orthopedics;  Laterality: Left;  . Colonoscopy    . Knee arthrotomy Left 08/28/2015    Procedure: LEFT KNEE ARTHROFIBROSIS EXCISION; pylectomy;  Surgeon: Meredith Pel, MD;  Location: [place];  Service: Orthopedics;  Laterality: Left;    There were no vitals filed for this visit.      Subjective Assessment - 10/08/15 1032    Subjective Pt late again.  Knee pain 8/10. Concerned about incision widening- measured and asked to tell MD. Sees him Wed.    Currently in Pain? Yes   Pain Score 8    Pain Location Knee   Pain Orientation Left;Anterior   Pain Descriptors / Indicators Aching   Pain Type Chronic pain;Surgical pain   Pain Onset More than a month ago   Pain Frequency Constant            [facility] PT Assessment - 10/08/15 1041    Observation/Other Assessments   Observations widest part of incision 1/4 inch    AROM   Left Knee Extension -12   Left Knee  Flexion 44                     [facility] Adult PT Treatment/Exercise - 10/08/15 1041    Knee/Hip Exercises: Stretches   Knee: Self-Stretch to increase Flexion Left;5 reps  sheet   Knee: Self-Stretch Limitations x 10 sec   Other Knee/Hip Stretches knee to chest active knee ext/flex with sheet to A, very difficult   Other Knee/Hip Stretches knee ext and flexion seated with towel    Knee/Hip Exercises: Aerobic   Nustep level 3 for 5 min AAROM cues to maintain hips level    Knee/Hip Exercises: Supine   Quad Sets Left;1 set;10 reps   Short Arc Quad Sets Left;1 set;20 reps   Heel Slides Left;1 set;10 reps   Other Supine Knee/Hip Exercises LAQ x 20 with min ROM    Modalities   Modalities Cryotherapy   Cryotherapy   Number Minutes Cryotherapy 10 Minutes   Cryotherapy Location Knee   Type of Cryotherapy Ice pack   Manual Therapy    Manual Therapy Edema management;Joint mobilization;Soft tissue mobilization;Myofascial release, low tolerance for this, used contract relax in sitting as well    Edema Management L knee   Joint Mobilization Gr 2-3 flexion    Soft tissue mobilization L knee    Myofascial Release L knee                PT Education - 10/08/15 1246    Education provided Yes   Education Details knee ROM, stretching techniques, contract relax   Person(s) Educated Patient   Methods Explanation   Comprehension Verbalized understanding          PT Short Term Goals - 10/08/15 1250    PT SHORT TERM GOAL #1   Title Pt will be I with initial HEP to continue care at home for max outcome   Status On-going   PT SHORT TERM GOAL #2   Title Pt will be able to demo 25 deg AROM in L knee flex   Status Achieved   PT SHORT TERM GOAL #3   Title Pt will be able to amb x 20 min with 25% less difficulty for shopping, community mobility   Status On-going           PT Long Term Goals - 10/08/15 1250    PT LONG TERM GOAL #1   Title Pt will report 75% reduction in L LE pain with functional mobility   Status On-going   PT LONG TERM GOAL #2   Title Pt will be able to flex L knee to 45 deg in sitting for ease of transfers   Status On-going   PT LONG TERM GOAL #3   Title Pt will be able to extend her knee seated (LAQ) and resist min pressure (3+/5) for gait, transfers.    Status On-going   PT LONG TERM GOAL #4   Title Pt will score 60% or less impaired on FOTO to demo improved function.    Status On-going               Plan - 10/08/15 1247    Clinical Impression Statement Patient continues to struggle with mobility due to limitations in knee AROM.  Measured today -12 to 44 deg with AAROM .  Painful and low tolerance for manual work.  Pt tearful, seems to be compliant with HEP but may not be pushing enough?   PT Next Visit Plan AAROM, strengthening, manu/soft tissue /PROM, try  HHA for gait    PT Home  Exercise Plan AAROM for knee flexion, has had PT prior, no handouts given on eval   Consulted and Agree with Plan of Care Patient      Patient will benefit from skilled therapeutic intervention in order to improve the following deficits and impairments:  Abnormal gait, Decreased range of motion, Difficulty walking, Increased fascial restricitons, Obesity, Decreased activity tolerance, Pain, Decreased skin integrity, Decreased balance, Decreased scar mobility, Hypomobility, Impaired flexibility, Improper body mechanics, Impaired sensation, Increased edema, Decreased strength, Decreased mobility  Visit Diagnosis: Stiffness of left knee, not elsewhere classified  Difficulty in walking, not elsewhere classified  Muscle weakness (generalized)  Pain of left lower extremity     Problem List Patient Active Problem List   Diagnosis Date Noted  . Arthrofibrosis of knee joint 08/28/2015  . Left hip pain 10/08/2014  . Type 2 diabetes mellitus without complication ([place]) [id number]  . Hyperlipidemia LDL goal <100 10/08/2014  . Essential hypertension, benign 08/30/2014  . Diabetes mellitus, type 2 ([place]) 08/30/2014  . Diverticulosis of colon without hemorrhage 08/30/2014  . Chronic low back pain 08/30/2014  . Chronic pain syndrome 08/30/2014  . Asthma in adult without complication 123XX123  . 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 Osteoarthritis of left knee 02/23/2012    PAA,JENNIFER 10/08/2015, 12:58 PM  [facility] [address] [place], [place], [address] Phone: [phone]   Fax:  [phone]  Name: ANUM CARLI MRN: [id number] Date of Birth: 1956-03-22    Raeford Razor, PT 10/08/2015 12:59 PM Phone: [phone] Fax: [phone]

## 2015-10-10 ENCOUNTER — Other Ambulatory Visit: Payer: Self-pay

## 2015-10-10 ENCOUNTER — Encounter: Payer: Medicare Other | Admitting: Physical Therapy

## 2015-10-10 MED ORDER — LINAGLIPTIN 5 MG PO TABS: 5.0000 mg | ORAL_TABLET | Freq: Every day | ORAL | Status: DC

## 2015-10-10 NOTE — Telephone Encounter (Signed)
Rx sent to [facility] [phone] [place] [address] Phone: [phone] Fax [phone]

## 2015-10-10 NOTE — Telephone Encounter (Signed)
Reprint

## 2015-10-10 NOTE — Addendum Note (Signed)
Addended by: Denyse Amass on: 10/10/2015 04:48 PM   Modules accepted: Orders

## 2015-10-11 ENCOUNTER — Ambulatory Visit: Payer: Medicare Other | Admitting: Allergy and Immunology

## 2015-10-11 ENCOUNTER — Other Ambulatory Visit: Payer: Self-pay | Admitting: Internal Medicine

## 2015-10-12 ENCOUNTER — Encounter: Payer: Self-pay | Admitting: Physical Therapy

## 2015-10-12 ENCOUNTER — Ambulatory Visit: Payer: Medicare Other | Admitting: Physical Therapy

## 2015-10-12 DIAGNOSIS — R269 Unspecified abnormalities of gait and mobility: Secondary | ICD-10-CM | POA: Diagnosis not present

## 2015-10-12 DIAGNOSIS — M79605 Pain in left leg: Secondary | ICD-10-CM | POA: Diagnosis not present

## 2015-10-12 DIAGNOSIS — M6281 Muscle weakness (generalized): Secondary | ICD-10-CM | POA: Diagnosis not present

## 2015-10-12 DIAGNOSIS — M25662 Stiffness of left knee, not elsewhere classified: Secondary | ICD-10-CM

## 2015-10-12 DIAGNOSIS — R262 Difficulty in walking, not elsewhere classified: Secondary | ICD-10-CM | POA: Diagnosis not present

## 2015-10-12 NOTE — Therapy (Signed)
[place], [place], [address] Phone: [phone]   Fax:  [phone]  Physical Therapy Treatment  Patient Details  Name: Tabitha Oneal MRN: [id number] Date of Birth: 07/24/1955 Referring Provider: Marlou Sa   Encounter Date: 10/12/2015      PT End of Session - 10/12/15 0858    Visit Number 5   Number of Visits 24   Date for PT Re-Evaluation 11/19/15   PT Start Time 0851   PT Stop Time 0940   PT Time Calculation (min) 49 min      Past Medical History  Diagnosis Date  . Osteoarthritis of left knee 02/23/2012  . Hard of hearing   . Asthma     QVAR daily and Albuterol as needed  . GERD (gastroesophageal reflux disease)     takes Omeprazole daily  . Seasonal allergies     takes Claritin and Singulair daily.Nasal spray as needed  . Hyperlipidemia     takes Atorvastatin daily  . Diabetes mellitus     takes Metformin and Tradjenta daily  . Hypertension     takes Quinapril daily  . Bladder infection     taking Keflex daily   . Shortness of breath dyspnea     with exertion  . Headache     daily.Takes Excedrine daily  . Joint pain   . Joint swelling   . Chronic back pain     reason unknown  . Nocturia   . History of blood transfusion     no abnormal reaction noted    Past Surgical History  Procedure Laterality Date  . Joint replacement      acl   bil knees  . Abdominal hysterectomy    . Total knee arthroplasty  02/23/2012    Procedure: TOTAL KNEE ARTHROPLASTY;  Surgeon: Johnny Bridge, MD;  Location: [place];  Service: Orthopedics;  Laterality: Left;  . Knee closed reduction  04/09/2012    Procedure: CLOSED MANIPULATION KNEE;  Surgeon: Johnny Bridge, MD;  Location: [place];  Service: Orthopedics;  Laterality: Left;  Manipulation Knee with Anesthesia includes Application of Traction   . Injection knee  04/09/2012    Procedure: KNEE INJECTION;  Surgeon: Johnny Bridge, MD;   Location: [place];  Service: Orthopedics;  Laterality: Left;  . Colonoscopy    . Knee arthrotomy Left 08/28/2015    Procedure: LEFT KNEE ARTHROFIBROSIS EXCISION; pylectomy;  Surgeon: Meredith Pel, MD;  Location: [place];  Service: Orthopedics;  Laterality: Left;    There were no vitals filed for this visit.      Subjective Assessment - 10/12/15 0856    Subjective Reports she has been trying to bend her knee when walking. pain is decreased due to taking pain pill about an hour prior to therapy.    Currently in Pain? Yes   Pain Score 6    Pain Location Knee   Pain Orientation Left   Pain Descriptors / Indicators Aching;Numbness  warm                         [facility] Adult PT Treatment/Exercise - 10/12/15 0001    Knee/Hip Exercises: Stretches   Gastroc Stretch 2 reps;30 seconds   Other Knee/Hip Stretches knee ext and flexion seated with towel, strap for aid  3 min PROM, 3 min AROM   Knee/Hip Exercises: Aerobic   Nustep level 3 for 5 min AAROM  Knee/Hip Exercises: Standing   Knee Flexion 20 reps;10 reps   Knee/Hip Exercises: Supine   Quad Sets Left;1 set;10 reps   Cryotherapy   Number Minutes Cryotherapy 10 Minutes   Cryotherapy Location Knee   Type of Cryotherapy Ice pack   Ambulation   Ambulation/Gait Assistance Details Other (comment)  Pt Mod assist, cues for gait pattern and posture                PT Education - 10/12/15 1028    Education provided Yes   Education Details rationale for knee flexion while walking, gait pattern   Person(s) Educated Patient   Methods Explanation;Demonstration;Tactile cues;Verbal cues   Comprehension Verbalized understanding;Returned demonstration;Verbal cues required;Tactile cues required;Need further instruction          PT Short Term Goals - 10/08/15 1250    PT SHORT TERM GOAL #1   Title Pt will be I with initial HEP to continue care at home for max outcome   Status On-going   PT SHORT  TERM GOAL #2   Title Pt will be able to demo 25 deg AROM in L knee flex   Status Achieved   PT SHORT TERM GOAL #3   Title Pt will be able to amb x 20 min with 25% less difficulty for shopping, community mobility   Status On-going           PT Long Term Goals - 10/08/15 1250    PT LONG TERM GOAL #1   Title Pt will report 75% reduction in L LE pain with functional mobility   Status On-going   PT LONG TERM GOAL #2   Title Pt will be able to flex L knee to 45 deg in sitting for ease of transfers   Status On-going   PT LONG TERM GOAL #3   Title Pt will be able to extend her knee seated (LAQ) and resist min pressure (3+/5) for gait, transfers.    Status On-going   PT LONG TERM GOAL #4   Title Pt will score 60% or less impaired on FOTO to demo improved function.    Status On-going               Plan - 10/12/15 1029    Clinical Impression Statement Worked with gait pattern while PT guarded and without AD. focus on upright posture as well as knee flexion in swing phase of LLE. Pt demo enough ROM during stretching to allow for appropriate swing through. pt was instructed to continue using AD while walking but to stand with upright posture and bend knee.    PT Treatment/Interventions ADLs/Self Care Home Management;Cryotherapy;Electrical Stimulation;Therapeutic exercise;Manual techniques;Vasopneumatic Device;Therapeutic activities;Taping;Functional mobility training;Gait training;Patient/family education;Passive range of motion;Scar mobilization;DME Instruction;Neuromuscular re-education;Balance training   PT Next Visit Plan AAROM, strengthening, manu/soft tissue /PROM, gait training   PT Home Exercise Plan AAROM for knee flexion, has had PT prior, no handouts given on eval   Consulted and Agree with Plan of Care Patient      Patient will benefit from skilled therapeutic intervention in order to improve the following deficits and impairments:  Abnormal gait, Decreased range of motion,  Difficulty walking, Increased fascial restricitons, Obesity, Decreased activity tolerance, Pain, Decreased skin integrity, Decreased balance, Decreased scar mobility, Hypomobility, Impaired flexibility, Improper body mechanics, Impaired sensation, Increased edema, Decreased strength, Decreased mobility  Visit Diagnosis: Stiffness of left knee, not elsewhere classified  Difficulty in walking, not elsewhere classified  Muscle weakness (generalized)  Pain of left lower extremity  Abnormality of gait     Problem List Patient Active Problem List   Diagnosis Date Noted  . Arthrofibrosis of knee joint 08/28/2015  . Left hip pain 10/08/2014  . Type 2 diabetes mellitus without complication ([place]) [id number]  . Hyperlipidemia LDL goal <100 10/08/2014  . Essential hypertension, benign 08/30/2014  . Diabetes mellitus, type 2 ([place]) 08/30/2014  . Diverticulosis of colon without hemorrhage 08/30/2014  . Chronic low back pain 08/30/2014  . Chronic pain syndrome 08/30/2014  . Asthma in adult without complication 123XX123  . 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 Osteoarthritis of left knee 02/23/2012    Jakylan Ron C. Gaige Fussner PT, DPT 10/12/2015 10:34 AM   [facility] [address] [place], [place], [address] Phone: [phone]   Fax:  [phone]  Name: Tabitha Oneal MRN: [id number] Date of Birth: 1956-03-19

## 2015-10-15 ENCOUNTER — Encounter: Payer: Medicare Other | Admitting: Physical Therapy

## 2015-10-15 DIAGNOSIS — M25562 Pain in left knee: Secondary | ICD-10-CM | POA: Diagnosis not present

## 2015-10-15 DIAGNOSIS — Z79891 Long term (current) use of opiate analgesic: Secondary | ICD-10-CM | POA: Diagnosis not present

## 2015-10-15 DIAGNOSIS — G894 Chronic pain syndrome: Secondary | ICD-10-CM | POA: Diagnosis not present

## 2015-10-15 DIAGNOSIS — M545 Low back pain: Secondary | ICD-10-CM | POA: Diagnosis not present

## 2015-10-17 ENCOUNTER — Ambulatory Visit: Payer: Medicare Other | Admitting: Physical Therapy

## 2015-10-17 DIAGNOSIS — R269 Unspecified abnormalities of gait and mobility: Secondary | ICD-10-CM | POA: Diagnosis not present

## 2015-10-17 DIAGNOSIS — M6281 Muscle weakness (generalized): Secondary | ICD-10-CM | POA: Diagnosis not present

## 2015-10-17 DIAGNOSIS — M25662 Stiffness of left knee, not elsewhere classified: Secondary | ICD-10-CM

## 2015-10-17 DIAGNOSIS — R262 Difficulty in walking, not elsewhere classified: Secondary | ICD-10-CM | POA: Diagnosis not present

## 2015-10-17 DIAGNOSIS — M79605 Pain in left leg: Secondary | ICD-10-CM | POA: Diagnosis not present

## 2015-10-17 NOTE — Therapy (Signed)
[place], [place], [address] Phone: [phone]   Fax:  [phone]  Physical Therapy Treatment  Patient Details  Name: Tabitha Oneal MRN: [id number] Date of Birth: 1955/09/11 Referring Provider: Marlou Sa   Encounter Date: 10/17/2015      PT End of Session - 10/17/15 1121    Visit Number 6   Number of Visits 24   Date for PT Re-Evaluation 11/19/15   PT Start Time [id number]   PT Stop Time 1120   PT Time Calculation (min) 65 min      Past Medical History  Diagnosis Date  . Osteoarthritis of left knee 02/23/2012  . Hard of hearing   . Asthma     QVAR daily and Albuterol as needed  . GERD (gastroesophageal reflux disease)     takes Omeprazole daily  . Seasonal allergies     takes Claritin and Singulair daily.Nasal spray as needed  . Hyperlipidemia     takes Atorvastatin daily  . Diabetes mellitus     takes Metformin and Tradjenta daily  . Hypertension     takes Quinapril daily  . Bladder infection     taking Keflex daily   . Shortness of breath dyspnea     with exertion  . Headache     daily.Takes Excedrine daily  . Joint pain   . Joint swelling   . Chronic back pain     reason unknown  . Nocturia   . History of blood transfusion     no abnormal reaction noted    Past Surgical History  Procedure Laterality Date  . Joint replacement      acl   bil knees  . Abdominal hysterectomy    . Total knee arthroplasty  02/23/2012    Procedure: TOTAL KNEE ARTHROPLASTY;  Surgeon: Johnny Bridge, MD;  Location: [facility];  Service: Orthopedics;  Laterality: Left;  . Knee closed reduction  04/09/2012    Procedure: CLOSED MANIPULATION KNEE;  Surgeon: Johnny Bridge, MD;  Location: [facility];  Service: Orthopedics;  Laterality: Left;  Manipulation Knee with Anesthesia includes Application of Traction   . Injection knee  04/09/2012    Procedure: KNEE INJECTION;  Surgeon: Johnny Bridge, MD;   Location: [facility];  Service: Orthopedics;  Laterality: Left;  . Colonoscopy    . Knee arthrotomy Left 08/28/2015    Procedure: LEFT KNEE ARTHROFIBROSIS EXCISION; pylectomy;  Surgeon: Meredith Pel, MD;  Location: [facility];  Service: Orthopedics;  Laterality: Left;    There were no vitals filed for this visit.      Subjective Assessment - 10/17/15 1051    Currently in Pain? Yes   Pain Score 7    Pain Location Knee   Pain Orientation Left   Aggravating Factors  bending knee, activity, wlaking   Pain Relieving Factors ice            [facility] PT Assessment - 10/17/15 0001    AROM   Left Knee Extension -18   Left Knee Flexion 40                     [facility] Adult PT Treatment/Exercise - 10/17/15 0001    Ambulation/Gait   Ambulation/Gait Assistance 6: Modified independent (Device/Increase time)   Ambulation Distance (Feet) 100 Feet   Assistive device Small based quad cane   Gait Pattern Decreased step length - left;Decreased hip/knee flexion - left;Left circumduction;Left hip  hike   Ambulation Surface Level;Indoor   Self-Care   Self-Care Heat/Ice Application;Other Self-Care Comments   Heat/Ice Application How to make a freezer gel pack with ice and alcohol   Other Self-Care Comments  Avoid wearing knee  immobilizer   Knee/Hip Exercises: Stretches   Active Hamstring Stretch 3 reps;30 seconds   Active Hamstring Stretch Limitations seated with foot on stool   Other Knee/Hip Stretches knee ext and flexion seated with fitter 2 blue and cues to decrease hip compensations   Knee/Hip Exercises: Aerobic   Nustep level 3 for 7 min AAROM   Knee/Hip Exercises: Seated   Long Arc Quad Left;10 reps   Long Arc Quad Limitations minimal ROM   Knee/Hip Exercises: Supine   Heel Slides Left;1 set;10 reps   Heel Slides Limitations with strap assist    Bridges Limitations x 10 with feet on ball then x 20 hamstring curls AAROM using ball    Vasopneumatic   Number  Minutes Vasopneumatic  15 minutes   Vasopnuematic Location  Knee   Vasopneumatic Pressure Medium   Vasopneumatic Temperature  39 deg   Manual Therapy   Manual Therapy Passive ROM   Edema Management L knee   Joint Mobilization Gr 2-3 flexion and extension   Passive ROM Knee flexion and extension                  PT Short Term Goals - 10/08/15 1250    PT SHORT TERM GOAL #1   Title Pt will be I with initial HEP to continue care at home for max outcome   Status On-going   PT SHORT TERM GOAL #2   Title Pt will be able to demo 25 deg AROM in L knee flex   Status Achieved   PT SHORT TERM GOAL #3   Title Pt will be able to amb x 20 min with 25% less difficulty for shopping, community mobility   Status On-going           PT Long Term Goals - 10/08/15 1250    PT LONG TERM GOAL #1   Title Pt will report 75% reduction in L LE pain with functional mobility   Status On-going   PT LONG TERM GOAL #2   Title Pt will be able to flex L knee to 45 deg in sitting for ease of transfers   Status On-going   PT LONG TERM GOAL #3   Title Pt will be able to extend her knee seated (LAQ) and resist min pressure (3+/5) for gait, transfers.    Status On-going   PT LONG TERM GOAL #4   Title Pt will score 60% or less impaired on FOTO to demo improved function.    Status On-going               Plan - 10/17/15 1123    Clinical Impression Statement Pt enters wearing knee immobilizer and quad cane. Pt educated on the knee for brace that allows knee flexion. Pt reports she has others at home and she feels the compression decreases her pain. Worked on gait with quad cane focusing on knee bend and heel strike. Knee mobs to increas ROM, followed by PROM, AAROM and strengthening in available ROM. Pt measures 18-40 degrees left knee prior to vaso. Pt reports she only has a small gel ice pack. Pt educated on how to make a home ice pack with alcohol and water.    PT Next Visit Plan AAROM,  strengthening, manu/soft  tissue /PROM, gait training, edema mgmt      Patient will benefit from skilled therapeutic intervention in order to improve the following deficits and impairments:  Abnormal gait, Decreased range of motion, Difficulty walking, Increased fascial restricitons, Obesity, Decreased activity tolerance, Pain, Decreased skin integrity, Decreased balance, Decreased scar mobility, Hypomobility, Impaired flexibility, Improper body mechanics, Impaired sensation, Increased edema, Decreased strength, Decreased mobility  Visit Diagnosis: Stiffness of left knee, not elsewhere classified  Difficulty in walking, not elsewhere classified  Muscle weakness (generalized)     Problem List Patient Active Problem List   Diagnosis Date Noted  . Arthrofibrosis of knee joint 08/28/2015  . Left hip pain 10/08/2014  . Type 2 diabetes mellitus without complication ([place]) [id number]  . Hyperlipidemia LDL goal <100 10/08/2014  . Essential hypertension, benign 08/30/2014  . Diabetes mellitus, type 2 ([place]) 08/30/2014  . Diverticulosis of colon without hemorrhage 08/30/2014  . Chronic low back pain 08/30/2014  . Chronic pain syndrome 08/30/2014  . Asthma in adult without complication [id number]  . 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 Osteoarthritis of left knee 02/23/2012    Dorene Ar, [place] 10/17/2015, 11:26 AM  [facility] [address] [place], [place], [address] Phone: [phone]   Fax:  [phone]  Name: EARNESTINE CIANO MRN: [id number] Date of Birth: 04/25/1956

## 2015-10-24 ENCOUNTER — Ambulatory Visit: Payer: Medicare Other | Admitting: Physical Therapy

## 2015-10-26 ENCOUNTER — Ambulatory Visit: Payer: Medicare Other | Attending: Orthopedic Surgery | Admitting: Physical Therapy

## 2015-10-26 DIAGNOSIS — M25662 Stiffness of left knee, not elsewhere classified: Secondary | ICD-10-CM | POA: Diagnosis not present

## 2015-10-26 DIAGNOSIS — M25562 Pain in left knee: Secondary | ICD-10-CM | POA: Diagnosis not present

## 2015-10-26 DIAGNOSIS — R269 Unspecified abnormalities of gait and mobility: Secondary | ICD-10-CM | POA: Insufficient documentation

## 2015-10-26 DIAGNOSIS — R262 Difficulty in walking, not elsewhere classified: Secondary | ICD-10-CM | POA: Insufficient documentation

## 2015-10-26 DIAGNOSIS — M6281 Muscle weakness (generalized): Secondary | ICD-10-CM | POA: Diagnosis not present

## 2015-10-26 DIAGNOSIS — R2689 Other abnormalities of gait and mobility: Secondary | ICD-10-CM | POA: Diagnosis not present

## 2015-10-26 DIAGNOSIS — M79605 Pain in left leg: Secondary | ICD-10-CM | POA: Insufficient documentation

## 2015-10-26 NOTE — Therapy (Signed)
Tabitha Oneal, [place], [address] Phone: [phone]   Fax:  [phone]  Physical Therapy Treatment  Patient Details  Name: Tabitha Oneal MRN: [id number] Date of Birth: 1956/04/07 Referring Provider: Marlou Sa   Encounter Date: 10/26/2015      PT End of Session - 10/26/15 1040    Visit Number 7   Number of Visits 24   Date for PT Re-Evaluation 11/19/15   PT Start Time 1030   PT Stop Time 1110   PT Time Calculation (min) 40 min   Activity Tolerance Patient tolerated treatment well   Behavior During Therapy [facility] for tasks assessed/performed      Past Medical History  Diagnosis Date  . Osteoarthritis of left knee 02/23/2012  . Hard of hearing   . Asthma     QVAR daily and Albuterol as needed  . GERD (gastroesophageal reflux disease)     takes Omeprazole daily  . Seasonal allergies     takes Claritin and Singulair daily.Nasal spray as needed  . Hyperlipidemia     takes Atorvastatin daily  . Diabetes mellitus     takes Metformin and Tradjenta daily  . Hypertension     takes Quinapril daily  . Bladder infection     taking Keflex daily   . Shortness of breath dyspnea     with exertion  . Headache     daily.Takes Excedrine daily  . Joint pain   . Joint swelling   . Chronic back pain     reason unknown  . Nocturia   . History of blood transfusion     no abnormal reaction noted    Past Surgical History  Procedure Laterality Date  . Joint replacement      acl   bil knees  . Abdominal hysterectomy    . Total knee arthroplasty  02/23/2012    Procedure: TOTAL KNEE ARTHROPLASTY;  Surgeon: Johnny Bridge, MD;  Location: [facility];  Service: Orthopedics;  Laterality: Left;  . Knee closed reduction  04/09/2012    Procedure: CLOSED MANIPULATION KNEE;  Surgeon: Johnny Bridge, MD;  Location: [facility];  Service: Orthopedics;  Laterality: Left;  Manipulation Knee with Anesthesia includes  Application of Traction   . Injection knee  04/09/2012    Procedure: KNEE INJECTION;  Surgeon: Johnny Bridge, MD;  Location: [facility];  Service: Orthopedics;  Laterality: Left;  . Colonoscopy    . Knee arthrotomy Left 08/28/2015    Procedure: LEFT KNEE ARTHROFIBROSIS EXCISION; pylectomy;  Surgeon: Meredith Pel, MD;  Location: [facility];  Service: Orthopedics;  Laterality: Left;    There were no vitals filed for this visit.      Subjective Assessment - 10/26/15 1039    Subjective Pt arr late, want to be able to drive.  Takes pain meds prior to PT, pain still 7/10.  So stiff in the AM, nighttime is really rough.    Currently in Pain? Yes   Pain Score 7             [facility] PT Assessment - 10/26/15 1048    AROM   Left Knee Extension -15   Left Knee Flexion 42      45 deg post manual work         [employer] Adult PT Treatment/Exercise - 10/26/15 1205    Knee/Hip Exercises: Aerobic   Nustep L5 LE and UE for AAROM and strength, 5 min  Knee/Hip Exercises: Seated   Long Arc Quad Left;1 set;10 reps   Hamstring Curl Strengthening;Left;1 set;15 reps   Knee/Hip Exercises: Supine   Quad Sets Strengthening;Left;1 set;10 reps   Knee Flexion AAROM;Left;10 reps   Manual Therapy   Manual Therapy Passive ROM   Edema Management L knee   Joint Mobilization Gr 2-3 flexion and extension   Soft tissue mobilization scar tissue, quads very tender along length of incision    Passive ROM Knee flexion and extension                PT Education - 10/26/15 1200    Education provided Yes   Education Details options for stretching, scar tissue mobility   Person(s) Educated Patient   Methods Explanation   Comprehension Verbalized understanding          PT Short Term Goals - 10/26/15 1040    PT SHORT TERM GOAL #1   Title Pt will be I with initial HEP to continue care at home for max outcome   Status Achieved   PT SHORT TERM GOAL #2   Title Pt will be able to demo  25 deg AROM in L knee flex   Status Achieved   PT SHORT TERM GOAL #3   Title Pt will be able to amb x 20 min with 25% less difficulty for shopping, community mobility   Status On-going           PT Long Term Goals - 10/26/15 1203    PT LONG TERM GOAL #1   Title Pt will report 75% reduction in L LE pain with functional mobility   Status On-going   PT LONG TERM GOAL #2   Title Pt will be able to flex L knee to 45 deg in sitting for ease of transfers   Baseline AAROM to 45 deg.    Status Partially Met   PT LONG TERM GOAL #3   Title Pt will be able to extend her knee seated (LAQ) and resist min pressure (3+/5) for gait, transfers.    Status On-going   PT LONG TERM GOAL #4   Title Pt will score 60% or less impaired on FOTO to demo improved function.    Status Unable to assess               Plan - 10/26/15 1201    Clinical Impression Statement Pt with brace today, allows her to feel supported with gait and bend knee.  She needed min A to walk from mat table to the NuStep and cane (without brace).  Makes efforts to walk with heel strike and proper sequencing with cane.  Pt increased AAROM to 45 deg.  Instructed on scar tissue work to increase ROM, very tender with manual today.  No further goals met.    PT Next Visit Plan AAROM, strengthening, manu/soft tissue /PROM, gait training, edema mgmt, JAS??   PT Home Exercise Plan AAROM for knee flexion, has had PT prior, no handouts given on eval   Consulted and Agree with Plan of Care Patient      Patient will benefit from skilled therapeutic intervention in order to improve the following deficits and impairments:  Abnormal gait, Decreased range of motion, Difficulty walking, Increased fascial restricitons, Obesity, Decreased activity tolerance, Pain, Decreased skin integrity, Decreased balance, Decreased scar mobility, Hypomobility, Impaired flexibility, Improper body mechanics, Impaired sensation, Increased edema, Decreased strength,  Decreased mobility  Visit Diagnosis: Stiffness of left knee, not elsewhere classified  Difficulty in  walking, not elsewhere classified  Muscle weakness (generalized)  Pain of left lower extremity  Abnormality of gait     Problem List Patient Active Problem List   Diagnosis Date Noted  . Arthrofibrosis of knee joint 08/28/2015  . Left hip pain 10/08/2014  . Type 2 diabetes mellitus without complication ([place]) 48/27/0786  . Hyperlipidemia LDL goal <100 10/08/2014  . Essential hypertension, benign 08/30/2014  . Diabetes mellitus, type 2 ([place]) 08/30/2014  . Diverticulosis of colon without hemorrhage 08/30/2014  . Chronic low back pain 08/30/2014  . Chronic pain syndrome 08/30/2014  . Asthma in adult without complication 75/44/9201  . 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 Osteoarthritis of left knee 02/23/2012    Evea Sheek 10/26/2015, 12:08 PM  [place], [place], [address] Phone: [phone]   Fax:  [phone]  Name: DAMARI HILTZ MRN: [id number] Date of Birth: 1955/06/17    Raeford Razor, PT 10/26/2015 12:08 PM Phone: [phone] Fax: [phone]

## 2015-10-29 ENCOUNTER — Other Ambulatory Visit: Payer: Self-pay | Admitting: *Deleted

## 2015-10-29 MED ORDER — METFORMIN HCL 500 MG PO TABS: ORAL_TABLET | ORAL | Status: DC

## 2015-10-29 NOTE — Telephone Encounter (Signed)
Patient requested to be faxed to pharmacy 

## 2015-11-02 ENCOUNTER — Ambulatory Visit: Payer: Medicare Other | Admitting: Physical Therapy

## 2015-11-02 DIAGNOSIS — M25562 Pain in left knee: Secondary | ICD-10-CM

## 2015-11-02 DIAGNOSIS — M6281 Muscle weakness (generalized): Secondary | ICD-10-CM

## 2015-11-02 DIAGNOSIS — M25662 Stiffness of left knee, not elsewhere classified: Secondary | ICD-10-CM

## 2015-11-02 DIAGNOSIS — R262 Difficulty in walking, not elsewhere classified: Secondary | ICD-10-CM

## 2015-11-02 NOTE — Therapy (Signed)
[place], [place], [address] Phone: [phone]   Fax:  [phone]  Physical Therapy Treatment  Patient Details  Name: Tabitha Oneal MRN: [id number] Date of Birth: 1955/09/20 Referring Provider: Marlou Sa   Encounter Date: 11/02/2015      PT End of Session - 11/02/15 1128    Visit Number 8   Number of Visits 24   Date for PT Re-Evaluation 11/19/15   PT Start Time 0900  13 min late   PT Stop Time 0945   PT Time Calculation (min) 45 min   Activity Tolerance Patient tolerated treatment well   Behavior During Therapy [facility] for tasks assessed/performed      Past Medical History  Diagnosis Date  . Osteoarthritis of left knee 02/23/2012  . Hard of hearing   . Asthma     QVAR daily and Albuterol as needed  . GERD (gastroesophageal reflux disease)     takes Omeprazole daily  . Seasonal allergies     takes Claritin and Singulair daily.Nasal spray as needed  . Hyperlipidemia     takes Atorvastatin daily  . Diabetes mellitus     takes Metformin and Tradjenta daily  . Hypertension     takes Quinapril daily  . Bladder infection     taking Keflex daily   . Shortness of breath dyspnea     with exertion  . Headache     daily.Takes Excedrine daily  . Joint pain   . Joint swelling   . Chronic back pain     reason unknown  . Nocturia   . History of blood transfusion     no abnormal reaction noted    Past Surgical History  Procedure Laterality Date  . Joint replacement      acl   bil knees  . Abdominal hysterectomy    . Total knee arthroplasty  02/23/2012    Procedure: TOTAL KNEE ARTHROPLASTY;  Surgeon: Johnny Bridge, MD;  Location: [facility];  Service: Orthopedics;  Laterality: Left;  . Knee closed reduction  04/09/2012    Procedure: CLOSED MANIPULATION KNEE;  Surgeon: Johnny Bridge, MD;  Location: [facility];  Service: Orthopedics;  Laterality: Left;  Manipulation Knee with  Anesthesia includes Application of Traction   . Injection knee  04/09/2012    Procedure: KNEE INJECTION;  Surgeon: Johnny Bridge, MD;  Location: [facility];  Service: Orthopedics;  Laterality: Left;  . Colonoscopy    . Knee arthrotomy Left 08/28/2015    Procedure: LEFT KNEE ARTHROFIBROSIS EXCISION; pylectomy;  Surgeon: Meredith Pel, MD;  Location: [facility];  Service: Orthopedics;  Laterality: Left;    There were no vitals filed for this visit.      Subjective Assessment - 11/02/15 0902    Subjective Pressure with walking.  Takes pain meds 3 times a day instead of every 4 hours.  Walking at home, tripped over the rug and almost fell but did not.    Currently in Pain? Yes   Pain Score 6    Pain Location Knee   Pain Orientation Left   Pain Descriptors / Indicators Aching;Pressure   Pain Type Chronic pain;Surgical pain   Pain Onset More than a month ago   Pain Frequency Intermittent   Aggravating Factors  bending, walking    Pain Relieving Factors ice    Effect of Pain on Daily Activities not sleeping at night, off balance  [facility] PT Assessment - 11/02/15 0904    PROM   Overall PROM  --  L knee flexion 50 with PROM   Strength   Right Hip Flexion --   Right Hip ABduction --   Left Hip Flexion --   Left Hip ABduction --   Left Knee Flexion --  diff to test due to limited ROM    Left Knee Extension --           [facility] Adult PT Treatment/Exercise - 11/02/15 0916    Knee/Hip Exercises: Aerobic   Nustep LE only 6 min L5 pushes back with hips min knee bend    Knee/Hip Exercises: Standing   Heel Raises Both;2 sets;20 reps   Knee Flexion AAROM;Strengthening;Left;1 set;20 reps   Hip Flexion Stengthening;Left;1 set;10 reps   Forward Lunges 3 sets;10 reps   Functional Squat 2 sets;10 reps   SLS with Vectors abduction x 10 each side    Manual Therapy   Manual Therapy Passive ROM   Edema Management L knee   Joint Mobilization Gr 2-3 flexion and  extension   Passive ROM Knee flexion and extension  contract relax x 5                 PT Education - 11/02/15 1103    Education provided Yes   Education Details assistance and strategy for upcoming wedding she is in   [employer]) Educated Patient   Methods Explanation;Demonstration   Comprehension Verbalized understanding;Returned demonstration          PT Short Term Goals - 10/26/15 1040    PT SHORT TERM GOAL #1   Title Pt will be I with initial HEP to continue care at home for max outcome   Status Achieved   PT SHORT TERM GOAL #2   Title Pt will be able to demo 25 deg AROM in L knee flex   Status Achieved   PT SHORT TERM GOAL #3   Title Pt will be able to amb x 20 min with 25% less difficulty for shopping, community mobility   Status On-going           PT Long Term Goals - 10/26/15 1203    PT LONG TERM GOAL #1   Title Pt will report 75% reduction in L LE pain with functional mobility   Status On-going   PT LONG TERM GOAL #2   Title Pt will be able to flex L knee to 45 deg in sitting for ease of transfers   Baseline AAROM to 45 deg.    Status Partially Met   PT LONG TERM GOAL #3   Title Pt will be able to extend her knee seated (LAQ) and resist min pressure (3+/5) for gait, transfers.    Status On-going   PT LONG TERM GOAL #4   Title Pt will score 60% or less impaired on FOTO to demo improved function.    Status Unable to assess               Plan - 11/02/15 1154    Clinical Impression Statement Called MD and left message to see if he would be willing to offer a prognosis for expected outcome of knee AROM.  She was able flex :L knee to 50 deg with another PT measuring (PROM).  She sees MD 11/12/15.  No further goals.   PT Next Visit Plan AAROM, strengthening, manu/soft tissue /PROM, gait training, edema mgmt, JAS??   PT Home Exercise Plan AAROM for  knee flexion, has had PT prior, no handouts given on eval   Consulted and Agree with Plan of Care  Patient      Patient will benefit from skilled therapeutic intervention in order to improve the following deficits and impairments:  Abnormal gait, Decreased range of motion, Difficulty walking, Increased fascial restricitons, Obesity, Decreased activity tolerance, Pain, Decreased skin integrity, Decreased balance, Decreased scar mobility, Hypomobility, Impaired flexibility, Improper body mechanics, Impaired sensation, Increased edema, Decreased strength, Decreased mobility  Visit Diagnosis: Stiffness of left knee, not elsewhere classified  Muscle weakness (generalized)  Difficulty in walking, not elsewhere classified  Pain in left knee     Problem List Patient Active Problem List   Diagnosis Date Noted  . Arthrofibrosis of knee joint 08/28/2015  . Left hip pain 10/08/2014  . Type 2 diabetes mellitus without complication ([place]) 27/74/1287  . Hyperlipidemia LDL goal <100 10/08/2014  . Essential hypertension, benign 08/30/2014  . Diabetes mellitus, type 2 ([place]) 08/30/2014  . Diverticulosis of colon without hemorrhage 08/30/2014  . Chronic low back pain 08/30/2014  . Chronic pain syndrome 08/30/2014  . Asthma in adult without complication 86/76/7209  . 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 Osteoarthritis of left knee 02/23/2012    Tabitha Oneal 11/02/2015, 12:00 PM  [facility] [address] [place], [place], [address] Phone: [phone]   Fax:  [phone]  Name: Tabitha Oneal MRN: [id number] Date of Birth: February 16, 1956    Raeford Razor, PT 11/02/2015 12:00 PM Phone: [phone] Fax: [phone]

## 2015-11-05 ENCOUNTER — Telehealth: Payer: Self-pay

## 2015-11-05 MED ORDER — AZELASTINE HCL 0.1 % NA SOLN: NASAL | Status: DC

## 2015-11-05 MED ORDER — BECLOMETHASONE DIPROPIONATE 80 MCG/ACT IN AERS: INHALATION_SPRAY | RESPIRATORY_TRACT | Status: DC

## 2015-11-05 MED ORDER — ALBUTEROL SULFATE HFA 108 (90 BASE) MCG/ACT IN AERS: 2.0000 | INHALATION_SPRAY | RESPIRATORY_TRACT | Status: DC | PRN

## 2015-11-05 NOTE — Telephone Encounter (Signed)
Amanda from [facility] on [address] to talk to a nurse about the patients prescriptions.  Please Advise  Thanks

## 2015-11-05 NOTE — Telephone Encounter (Signed)
Spoke to pharmacy they need updated medication prescription Azelatine and Qvar sent in

## 2015-11-07 ENCOUNTER — Ambulatory Visit: Payer: Medicare Other | Admitting: Physical Therapy

## 2015-11-07 DIAGNOSIS — M25662 Stiffness of left knee, not elsewhere classified: Secondary | ICD-10-CM | POA: Diagnosis not present

## 2015-11-07 DIAGNOSIS — M6281 Muscle weakness (generalized): Secondary | ICD-10-CM

## 2015-11-07 DIAGNOSIS — M25562 Pain in left knee: Secondary | ICD-10-CM

## 2015-11-07 DIAGNOSIS — R262 Difficulty in walking, not elsewhere classified: Secondary | ICD-10-CM

## 2015-11-07 NOTE — Therapy (Signed)
[facility] [address] [place], [place], [address] Phone: [phone]   Fax:  [phone]  Physical Therapy Treatment  Patient Details  Name: Tabitha Oneal MRN: [id number] Date of Birth: [birth year] Referring Provider: August Saucer   Encounter Date: 11/07/2015      PT End of Session - 11/07/15 1109    Visit Number 9   Number of Visits 24   Date for PT Re-Evaluation 11/19/15   PT Start Time 1020   PT Stop Time 1118   PT Time Calculation (min) 58 min   Activity Tolerance Patient tolerated treatment well   Behavior During Therapy [facility] for tasks assessed/performed      Past Medical History  Diagnosis Date  . Osteoarthritis of left knee 02/23/2012  . Hard of hearing   . Asthma     QVAR daily and Albuterol as needed  . GERD (gastroesophageal reflux disease)     takes Omeprazole daily  . Seasonal allergies     takes Claritin and Singulair daily.Nasal spray as needed  . Hyperlipidemia     takes Atorvastatin daily  . Diabetes mellitus     takes Metformin and Tradjenta daily  . Hypertension     takes Quinapril daily  . Bladder infection     taking Keflex daily   . Shortness of breath dyspnea     with exertion  . Headache     daily.Takes Excedrine daily  . Joint pain   . Joint swelling   . Chronic back pain     reason unknown  . Nocturia   . History of blood transfusion     no abnormal reaction noted    Past Surgical History  Procedure Laterality Date  . Joint replacement      acl   bil knees  . Abdominal hysterectomy    . Total knee arthroplasty  02/23/2012    Procedure: TOTAL KNEE ARTHROPLASTY;  Surgeon: Eulas Post, MD;  Location: [facility];  Service: Orthopedics;  Laterality: Left;  . Knee closed reduction  04/09/2012    Procedure: CLOSED MANIPULATION KNEE;  Surgeon: Eulas Post, MD;  Location: [facility];  Service: Orthopedics;  Laterality: Left;  Manipulation Knee with Anesthesia includes  Application of Traction   . Injection knee  04/09/2012    Procedure: KNEE INJECTION;  Surgeon: Eulas Post, MD;  Location: [facility];  Service: Orthopedics;  Laterality: Left;  . Colonoscopy    . Knee arthrotomy Left 08/28/2015    Procedure: LEFT KNEE ARTHROFIBROSIS EXCISION; pylectomy;  Surgeon: Cammy Copa, MD;  Location[facility];  Service: Orthopedics;  Laterality: Left;    There were no vitals filed for this visit.      Subjective Assessment - 11/07/15 1022    Subjective Walks in without brace "trying to see how I do without it".     Currently in Pain? Yes   Pain Score 6    Pain Location Knee   Pain Orientation Left   Pain Type Chronic pain;Surgical pain   Pain Onset More than a month ago   Pain Frequency Constant   Aggravating Factors  bending, activity, paoin wakes her at night.    Pain Relieving Factors RICE, meds, does not ever get full relief    Effect of Pain on Daily Activities walking, decr confidence , can;t sleep    Multiple Pain Sites No           [facility] Adult PT Treatment/Exercise -  11/07/15 1038    Knee/Hip Exercises: Standing   Heel Raises Both;1 set;20 reps   Heel Raises Limitations focus on hip ext and knee ext   Forward Lunges Left;1 set;10 reps   Forward Step Up Left;1 set;Hand Hold: 2;Step Height: 6"   Forward Step Up Limitations mod A HHA and max cues for technique   Wall Squat 2 sets;10 reps   Wall Squat Limitations cues for maintaining hip alignment, avoiding compensations , liftted Rt. heel to incr WB on L    Other Standing Knee Exercises standing glute med isometric   x 10 x 2 sets, 1 with knee rotating into wall    Vasopneumatic   Number Minutes Vasopneumatic  15 minutes   Vasopnuematic Location  Knee   Vasopneumatic Pressure Medium   Vasopneumatic Temperature  39 deg   Manual Therapy   Manual Therapy Passive ROM   Edema Management L knee   Joint Mobilization Gr 2-3 flexion and extension   Passive ROM Knee flexion  and extension  contract relax x 5                   PT Short Term Goals - 10/26/15 1040    PT SHORT TERM GOAL #1   Title Pt will be I with initial HEP to continue care at home for max outcome   Status Achieved   PT SHORT TERM GOAL #2   Title Pt will be able to demo 25 deg AROM in L knee flex   Status Achieved   PT SHORT TERM GOAL #3   Title Pt will be able to amb x 20 min with 25% less difficulty for shopping, community mobility   Status On-going           PT Long Term Goals - 10/26/15 1203    PT LONG TERM GOAL #1   Title Pt will report 75% reduction in L LE pain with functional mobility   Status On-going   PT LONG TERM GOAL #2   Title Pt will be able to flex L knee to 45 deg in sitting for ease of transfers   Baseline AAROM to 45 deg.    Status Partially Met   PT LONG TERM GOAL #3   Title Pt will be able to extend her knee seated (LAQ) and resist min pressure (3+/5) for gait, transfers.    Status On-going   PT LONG TERM GOAL #4   Title Pt will score 60% or less impaired on FOTO to demo improved function.    Status Unable to assess               Plan - 11/07/15 1109    Clinical Impression Statement MD responded by saying Tabitha Oneal was doing well and doing more than she has been, "keep doing what you're doing" per message by Nurse.  FOTO done today, patient only improved 2% in function.  Addressed stagnant edema today with education on MLD and did more functional strengthening to build confidence.     PT Next Visit Plan AAROM, strengthening, manu/soft tissue /PROM, gait training, edema mgmt, JAS?? MD note?   PT Home Exercise Plan AAROM for knee flexion, has had PT prior, no handouts given on eval   Consulted and Agree with Plan of Care Patient      Patient will benefit from skilled therapeutic intervention in order to improve the following deficits and impairments:  Abnormal gait, Decreased range of motion, Difficulty walking, Increased fascial  restricitons, Obesity, Decreased  activity tolerance, Pain, Decreased skin integrity, Decreased balance, Decreased scar mobility, Hypomobility, Impaired flexibility, Improper body mechanics, Impaired sensation, Increased edema, Decreased strength, Decreased mobility  Visit Diagnosis: Stiffness of left knee, not elsewhere classified  Muscle weakness (generalized)  Difficulty in walking, not elsewhere classified  Pain in left knee       G-Codes - November 28, 2015 1026    Functional Assessment Tool Used FOTO    Functional Limitation Mobility: Walking and moving around   Mobility: Walking and Moving Around Current Status [phone]) At least 60 percent but less than 80 percent impaired, limited or restricted   Mobility: Walking and Moving Around Goal Status [phone]) At least 40 percent but less than 60 percent impaired, limited or restricted      Problem List Patient Active Problem List   Diagnosis Date Noted  . Arthrofibrosis of knee joint 08/28/2015  . Left hip pain 10/08/2014  . Type 2 diabetes mellitus without complication ([place]) 48/59/2763  . Hyperlipidemia LDL goal <100 10/08/2014  . Essential hypertension, benign 08/30/2014  . Diabetes mellitus, type 2 ([place]) 08/30/2014  . Diverticulosis of colon without hemorrhage 08/30/2014  . Chronic low back pain 08/30/2014  . Chronic pain syndrome 08/30/2014  . Asthma in adult without complication 94/32/0037  . 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 Osteoarthritis of left knee 02/23/2012    PAA,JENNIFER Nov 28, 2015, 11:16 AM  [facility] [address] [place], [place], [address] Phone: [phone]   Fax:  [phone]  Name: Tabitha Oneal MRN: [id number] Date of Birth: March 09, 1956    Raeford Razor, PT 11/28/15 11:16 AM Phone: [phone] Fax: [phone]

## 2015-11-09 ENCOUNTER — Ambulatory Visit: Payer: Medicare Other | Admitting: Physical Therapy

## 2015-11-09 DIAGNOSIS — R2689 Other abnormalities of gait and mobility: Secondary | ICD-10-CM

## 2015-11-09 DIAGNOSIS — R262 Difficulty in walking, not elsewhere classified: Secondary | ICD-10-CM

## 2015-11-09 DIAGNOSIS — M25662 Stiffness of left knee, not elsewhere classified: Secondary | ICD-10-CM | POA: Diagnosis not present

## 2015-11-09 DIAGNOSIS — M6281 Muscle weakness (generalized): Secondary | ICD-10-CM

## 2015-11-09 DIAGNOSIS — M25562 Pain in left knee: Secondary | ICD-10-CM

## 2015-11-09 NOTE — Patient Instructions (Signed)
Hamstring Stretch - Supine    Lie on back, uninvolved leg raised toward ceiling, towel around knee. Pull thigh toward chest until a stretch is felt on back of thigh. Hold for  30 ___ seconds. If possible, move towel up to calf and repeat. Repeat on involved leg. Repeat _3   times. Do __3_ times per day.  Copyright  VHI. All rights reserved.

## 2015-11-09 NOTE — Therapy (Signed)
[place], [place], [address] Phone: [phone]   Fax:  [phone]  Physical Therapy Treatment  Patient Details  Name: Tabitha Oneal MRN: [id number] Date of Birth: 08-Sep-1955 Referring Provider: Marlou Sa   Encounter Date: 11/09/2015      PT End of Session - 11/09/15 1035    Visit Number 10   Number of Visits 24   Date for PT Re-Evaluation 11/19/15   PT Start Time 1025   PT Stop Time 1115   PT Time Calculation (min) 50 min   Activity Tolerance Patient tolerated treatment well   Behavior During Therapy [facility] for tasks assessed/performed      Past Medical History  Diagnosis Date  . Osteoarthritis of left knee 02/23/2012  . Hard of hearing   . Asthma     QVAR daily and Albuterol as needed  . GERD (gastroesophageal reflux disease)     takes Omeprazole daily  . Seasonal allergies     takes Claritin and Singulair daily.Nasal spray as needed  . Hyperlipidemia     takes Atorvastatin daily  . Diabetes mellitus     takes Metformin and Tradjenta daily  . Hypertension     takes Quinapril daily  . Bladder infection     taking Keflex daily   . Shortness of breath dyspnea     with exertion  . Headache     daily.Takes Excedrine daily  . Joint pain   . Joint swelling   . Chronic back pain     reason unknown  . Nocturia   . History of blood transfusion     no abnormal reaction noted    Past Surgical History  Procedure Laterality Date  . Joint replacement      acl   bil knees  . Abdominal hysterectomy    . Total knee arthroplasty  02/23/2012    Procedure: TOTAL KNEE ARTHROPLASTY;  Surgeon: Johnny Bridge, MD;  Location: [place];  Service: Orthopedics;  Laterality: Left;  . Knee closed reduction  04/09/2012    Procedure: CLOSED MANIPULATION KNEE;  Surgeon: Johnny Bridge, MD;  Location: [place];  Service: Orthopedics;  Laterality: Left;  Manipulation Knee with Anesthesia includes  Application of Traction   . Injection knee  04/09/2012    Procedure: KNEE INJECTION;  Surgeon: Johnny Bridge, MD;  Location: [place];  Service: Orthopedics;  Laterality: Left;  . Colonoscopy    . Knee arthrotomy Left 08/28/2015    Procedure: LEFT KNEE ARTHROFIBROSIS EXCISION; pylectomy;  Surgeon: Meredith Pel, MD;  Location: [place];  Service: Orthopedics;  Laterality: Left;    There were no vitals filed for this visit.      Subjective Assessment - 11/09/15 1031    Subjective Pt with increase in pain today, 10/10.  Feels like I just got out of surgery.    Currently in Pain? Yes   Pain Score 10-Worst pain ever   Pain Location Knee   Pain Orientation Left   Pain Descriptors / Indicators Aching;Throbbing   Pain Type Chronic pain;Surgical pain   Pain Onset More than a month ago   Pain Frequency Constant   Aggravating Factors  bending, activity, walking   Pain Relieving Factors RICE, meds, not full relief   Effect of Pain on Daily Activities walking, decr balance and confidence, sleep   Multiple Pain Sites No            [facility] PT Assessment -  11/09/15 1036    Circumferential Edema   Circumferential - Left  17 3/4 inch    AROM   Left Knee Extension 20   Left Knee Flexion 45  AAROM with sheet            [facility] Adult PT Treatment/Exercise - 11/09/15 1034    Knee/Hip Exercises: Stretches   Active Hamstring Stretch 3 reps;30 seconds   Knee: Self-Stretch to increase Flexion Left;10 seconds   Knee: Self-Stretch Limitations x 10 , 2 sets    Knee/Hip Exercises: Aerobic   Nustep UE and LE L5 for AAROM, strength   Knee/Hip Exercises: Supine   Short Arc Quad Sets Strengthening;Left;1 set;20 reps   Straight Leg Raises Strengthening;Left;1 set;20 reps   Knee/Hip Exercises: Sidelying   Hip ABduction Strengthening;Left;1 set;20 reps   Other Sidelying Knee/Hip Exercises side leg circles, poor stabilization    Cryotherapy   Number Minutes Cryotherapy 10 Minutes    Cryotherapy Location Knee   Type of Cryotherapy Ice pack            PT Education - 11/09/15 1059    Education provided Yes   Education Details AROM, JAS for static stretching, hamstring   Person(s) Educated Patient   Methods Explanation;Demonstration;Handout   Comprehension Verbalized understanding;Returned demonstration          PT Short Term Goals - 11/09/15 1047    PT SHORT TERM GOAL #1   Title Pt will be I with initial HEP to continue care at home for max outcome   Status Achieved   PT SHORT TERM GOAL #2   Title Pt will be able to demo 25 deg AROM in L knee flex   Status Achieved   PT SHORT TERM GOAL #3   Title Pt will be able to amb x 20 min with 25% less difficulty for shopping, community mobility   Status Achieved           PT Long Term Goals - 11/09/15 1047    PT LONG TERM GOAL #1   Title Pt will report 75% reduction in L LE pain with functional mobility   Status On-going   PT LONG TERM GOAL #2   Title Pt will be able to flex L knee to 45 deg in sitting for ease of transfers   Status Partially Met   PT LONG TERM GOAL #3   Title Pt will be able to extend her knee seated (LAQ) and resist min pressure (3+/5) for gait, transfers.    Baseline improved but 2-/5.    Status On-going   PT LONG TERM GOAL #4   Title Pt will score 60% or less impaired on FOTO to demo improved function.    Baseline improved 58%    Status On-going               Plan - 11/09/15 1107    Clinical Impression Statement Patient at visit 10, making minimal progress towards goals.  AROM : lacks 20 deg and flexes knee to 45 deg. She cont to have swelling, pain and difficulty walking.  Recommended walker for when pain is high.  May benefit from JAS to increase AROM, open to other suggestions.     PT Next Visit Plan AAROM, strengthening, manu/soft tissue /PROM, gait training, edema mgmt, JAS?   PT Home Exercise Plan AAROM for knee flexion, has had PT prior, no handouts given on eval    Consulted and Agree with Plan of Care Patient  Patient will benefit from skilled therapeutic intervention in order to improve the following deficits and impairments:  Abnormal gait, Decreased range of motion, Difficulty walking, Increased fascial restricitons, Obesity, Decreased activity tolerance, Pain, Decreased skin integrity, Decreased balance, Decreased scar mobility, Hypomobility, Impaired flexibility, Improper body mechanics, Impaired sensation, Increased edema, Decreased strength, Decreased mobility  Visit Diagnosis: Stiffness of left knee, not elsewhere classified  Muscle weakness (generalized)  Difficulty in walking, not elsewhere classified  Pain in left knee  Other abnormalities of gait and mobility     Problem List Patient Active Problem List   Diagnosis Date Noted  . Arthrofibrosis of knee joint 08/28/2015  . Left hip pain 10/08/2014  . Type 2 diabetes mellitus without complication ([place]) 32/25/6720  . Hyperlipidemia LDL goal <100 10/08/2014  . Essential hypertension, benign 08/30/2014  . Diabetes mellitus, type 2 ([place]) 08/30/2014  . Diverticulosis of colon without hemorrhage 08/30/2014  . Chronic low back pain 08/30/2014  . Chronic pain syndrome 08/30/2014  . Asthma in adult without complication 91/98/0221  . 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 Osteoarthritis of left knee 02/23/2012    Elen Acero 11/09/2015, 11:16 AM  [facility] [address] [place], [place], [address] Phone: [phone]   Fax:  [phone]  Name: Tabitha Oneal MRN: [id number] Date of Birth: [birth year]    Raeford Razor, PT 11/09/2015 11:16 AM Phone: [phone] Fax: [phone]

## 2015-11-11 ENCOUNTER — Other Ambulatory Visit: Payer: Self-pay | Admitting: Internal Medicine

## 2015-11-13 DIAGNOSIS — M545 Low back pain: Secondary | ICD-10-CM | POA: Diagnosis not present

## 2015-11-13 DIAGNOSIS — M25562 Pain in left knee: Secondary | ICD-10-CM | POA: Diagnosis not present

## 2015-11-13 DIAGNOSIS — Z79891 Long term (current) use of opiate analgesic: Secondary | ICD-10-CM | POA: Diagnosis not present

## 2015-11-13 DIAGNOSIS — G894 Chronic pain syndrome: Secondary | ICD-10-CM | POA: Diagnosis not present

## 2015-11-14 ENCOUNTER — Ambulatory Visit: Payer: Medicare Other | Admitting: Physical Therapy

## 2015-11-14 ENCOUNTER — Other Ambulatory Visit: Payer: Self-pay | Admitting: Internal Medicine

## 2015-11-14 DIAGNOSIS — R2689 Other abnormalities of gait and mobility: Secondary | ICD-10-CM

## 2015-11-14 DIAGNOSIS — M25662 Stiffness of left knee, not elsewhere classified: Secondary | ICD-10-CM | POA: Diagnosis not present

## 2015-11-14 DIAGNOSIS — R262 Difficulty in walking, not elsewhere classified: Secondary | ICD-10-CM

## 2015-11-14 DIAGNOSIS — M25562 Pain in left knee: Secondary | ICD-10-CM

## 2015-11-14 DIAGNOSIS — M6281 Muscle weakness (generalized): Secondary | ICD-10-CM

## 2015-11-14 DIAGNOSIS — Z139 Encounter for screening, unspecified: Secondary | ICD-10-CM

## 2015-11-14 NOTE — Therapy (Signed)
[place], [place], [address] Phone: [phone]   Fax:  [phone]  Physical Therapy Treatment  Patient Details  Name: Tabitha Oneal MRN: [id number] Date of Birth: 1955/07/29 Referring Provider: Marlou Sa   Encounter Date: 11/14/2015      PT End of Session - 11/14/15 1057    Visit Number 11   Number of Visits 24   Date for PT Re-Evaluation 11/19/15   PT Start Time 1020   PT Stop Time 1102   PT Time Calculation (min) 42 min   Activity Tolerance Patient tolerated treatment well   Behavior During Therapy [facility] for tasks assessed/performed      Past Medical History  Diagnosis Date  . Osteoarthritis of left knee 02/23/2012  . Hard of hearing   . Asthma     QVAR daily and Albuterol as needed  . GERD (gastroesophageal reflux disease)     takes Omeprazole daily  . Seasonal allergies     takes Claritin and Singulair daily.Nasal spray as needed  . Hyperlipidemia     takes Atorvastatin daily  . Diabetes mellitus     takes Metformin and Tradjenta daily  . Hypertension     takes Quinapril daily  . Bladder infection     taking Keflex daily   . Shortness of breath dyspnea     with exertion  . Headache     daily.Takes Excedrine daily  . Joint pain   . Joint swelling   . Chronic back pain     reason unknown  . Nocturia   . History of blood transfusion     no abnormal reaction noted    Past Surgical History  Procedure Laterality Date  . Joint replacement      acl   bil knees  . Abdominal hysterectomy    . Total knee arthroplasty  02/23/2012    Procedure: TOTAL KNEE ARTHROPLASTY;  Surgeon: Johnny Bridge, MD;  Location: [place];  Service: Orthopedics;  Laterality: Left;  . Knee closed reduction  04/09/2012    Procedure: CLOSED MANIPULATION KNEE;  Surgeon: Johnny Bridge, MD;  Location: [place];  Service: Orthopedics;  Laterality: Left;  Manipulation Knee with Anesthesia includes  Application of Traction   . Injection knee  04/09/2012    Procedure: KNEE INJECTION;  Surgeon: Johnny Bridge, MD;  Location: [place];  Service: Orthopedics;  Laterality: Left;  . Colonoscopy    . Knee arthrotomy Left 08/28/2015    Procedure: LEFT KNEE ARTHROFIBROSIS EXCISION; pylectomy;  Surgeon: Meredith Pel, MD;  Location: [place];  Service: Orthopedics;  Laterality: Left;    There were no vitals filed for this visit.      Subjective Assessment - 11/14/15 1023    Subjective Saw MD. He said to keep doing PT, gave her a new referral for L knee arthrofibrosis? Did not give her back the JAS referral.  Called to ask about it.  Able to drive now. Taking less pain meds.     Currently in Pain? Yes   Pain Score 5    Pain Location Knee   Pain Orientation Left   Pain Descriptors / Indicators Aching   Pain Type Chronic pain;Surgical pain   Pain Onset More than a month ago   Pain Frequency Constant          [facility] Adult PT Treatment/Exercise - 11/14/15 1041    Knee/Hip Exercises: Stretches   Knee: Self-Stretch to increase  Flexion Left;10 seconds   Knee: Self-Stretch Limitations x 10 , 2 sets   done in prone with strap    Knee/Hip Exercises: Aerobic   Nustep UE and LE L5 for AAROM, strength, 7 min    Knee/Hip Exercises: Machines for Strengthening   Cybex Leg Press 1 plates, used for PROM flexion stretching and for leg press, LLE strength.  Unable to do with proper form, L hip hikes despite verbal and tactile cueing.   knee ext stretching and heel raises x 10 as well    Knee/Hip Exercises: Prone   Hamstring Curl 1 set;20 reps   Hip Extension Strengthening;Both;20 reps   Other Prone Exercises prone quad set x 10                 PT Education - 11/14/15 1316    Education provided Yes   Education Details prone stretching   Person(s) Educated Patient   Methods Explanation;Demonstration;Tactile cues   Comprehension Verbalized understanding;Returned  demonstration          PT Short Term Goals - 11/09/15 1047    PT SHORT TERM GOAL #1   Title Pt will be I with initial HEP to continue care at home for max outcome   Status Achieved   PT SHORT TERM GOAL #2   Title Pt will be able to demo 25 deg AROM in L knee flex   Status Achieved   PT SHORT TERM GOAL #3   Title Pt will be able to amb x 20 min with 25% less difficulty for shopping, community mobility   Status Achieved           PT Long Term Goals - 11/09/15 1047    PT LONG TERM GOAL #1   Title Pt will report 75% reduction in L LE pain with functional mobility   Status On-going   PT LONG TERM GOAL #2   Title Pt will be able to flex L knee to 45 deg in sitting for ease of transfers   Status Partially Met   PT LONG TERM GOAL #3   Title Pt will be able to extend her knee seated (LAQ) and resist min pressure (3+/5) for gait, transfers.    Baseline improved but 2-/5.    Status On-going   PT LONG TERM GOAL #4   Title Pt will score 60% or less impaired on FOTO to demo improved function.    Baseline improved 58%    Status On-going               Plan - 11/14/15 1316    Clinical Impression Statement Patient with less pain overall and able to walk without brace and improved confidence.  Will follow up with MD about his thoughts on a JAS for patient.  Did not sign and return referral but may have not seen it.     PT Next Visit Plan AAROM, strengthening, manu/soft tissue /PROM, gait training, edema mgmt, JAS?   PT Home Exercise Plan AAROM for knee flexion, has had PT prior, no handouts given on eval   Consulted and Agree with Plan of Care Patient      Patient will benefit from skilled therapeutic intervention in order to improve the following deficits and impairments:  Abnormal gait, Decreased range of motion, Difficulty walking, Increased fascial restricitons, Obesity, Decreased activity tolerance, Pain, Decreased skin integrity, Decreased balance, Decreased scar mobility,  Hypomobility, Impaired flexibility, Improper body mechanics, Impaired sensation, Increased edema, Decreased strength, Decreased mobility  Visit Diagnosis:  Stiffness of left knee, not elsewhere classified  Muscle weakness (generalized)  Difficulty in walking, not elsewhere classified  Pain in left knee  Other abnormalities of gait and mobility     Problem List Patient Active Problem List   Diagnosis Date Noted  . Arthrofibrosis of knee joint 08/28/2015  . Left hip pain 10/08/2014  . Type 2 diabetes mellitus without complication ([place]) 57/49/3552  . Hyperlipidemia LDL goal <100 10/08/2014  . Essential hypertension, benign 08/30/2014  . Diabetes mellitus, type 2 ([place]) 08/30/2014  . Diverticulosis of colon without hemorrhage 08/30/2014  . Chronic low back pain 08/30/2014  . Chronic pain syndrome 08/30/2014  . Asthma in adult without complication 17/47/1595  . 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 Osteoarthritis of left knee 02/23/2012    Finas Delone 11/14/2015, 1:19 PM  [facility] [address] [place], [place], [address] Phone: [phone]   Fax:  [phone]  Name: Tabitha Oneal MRN: [id number] Date of Birth: 06-Mar-1956    Raeford Razor, PT 11/14/2015 1:19 PM Phone: [phone] Fax: [phone]

## 2015-11-16 ENCOUNTER — Ambulatory Visit: Payer: Medicare Other | Admitting: Physical Therapy

## 2015-11-16 DIAGNOSIS — M6281 Muscle weakness (generalized): Secondary | ICD-10-CM

## 2015-11-16 DIAGNOSIS — R2689 Other abnormalities of gait and mobility: Secondary | ICD-10-CM

## 2015-11-16 DIAGNOSIS — M25662 Stiffness of left knee, not elsewhere classified: Secondary | ICD-10-CM

## 2015-11-16 DIAGNOSIS — R262 Difficulty in walking, not elsewhere classified: Secondary | ICD-10-CM

## 2015-11-16 DIAGNOSIS — M25562 Pain in left knee: Secondary | ICD-10-CM

## 2015-11-16 NOTE — Therapy (Signed)
[place], [place], [address] Phone: [phone]   Fax:  [phone]  Physical Therapy Treatment  Patient Details  Name: Tabitha Oneal MRN: [id number] Date of Birth: June 10, 1955 Referring Provider: Marlou Sa   Encounter Date: 11/16/2015      PT End of Session - 11/16/15 1042    Visit Number 12   Number of Visits 24   Date for PT Re-Evaluation 12/17/15   PT Start Time 1018   PT Stop Time 1112   PT Time Calculation (min) 54 min   Activity Tolerance Patient tolerated treatment well   Behavior During Therapy [facility] for tasks assessed/performed      Past Medical History  Diagnosis Date  . Osteoarthritis of left knee 02/23/2012  . Hard of hearing   . Asthma     QVAR daily and Albuterol as needed  . GERD (gastroesophageal reflux disease)     takes Omeprazole daily  . Seasonal allergies     takes Claritin and Singulair daily.Nasal spray as needed  . Hyperlipidemia     takes Atorvastatin daily  . Diabetes mellitus     takes Metformin and Tradjenta daily  . Hypertension     takes Quinapril daily  . Bladder infection     taking Keflex daily   . Shortness of breath dyspnea     with exertion  . Headache     daily.Takes Excedrine daily  . Joint pain   . Joint swelling   . Chronic back pain     reason unknown  . Nocturia   . History of blood transfusion     no abnormal reaction noted    Past Surgical History  Procedure Laterality Date  . Joint replacement      acl   bil knees  . Abdominal hysterectomy    . Total knee arthroplasty  02/23/2012    Procedure: TOTAL KNEE ARTHROPLASTY;  Surgeon: Johnny Bridge, MD;  Location: [place];  Service: Orthopedics;  Laterality: Left;  . Knee closed reduction  04/09/2012    Procedure: CLOSED MANIPULATION KNEE;  Surgeon: Johnny Bridge, MD;  Location: [place];  Service: Orthopedics;  Laterality: Left;  Manipulation Knee with Anesthesia includes  Application of Traction   . Injection knee  04/09/2012    Procedure: KNEE INJECTION;  Surgeon: Johnny Bridge, MD;  Location: [place];  Service: Orthopedics;  Laterality: Left;  . Colonoscopy    . Knee arthrotomy Left 08/28/2015    Procedure: LEFT KNEE ARTHROFIBROSIS EXCISION; pylectomy;  Surgeon: Meredith Pel, MD;  Location: [place];  Service: Orthopedics;  Laterality: Left;    There were no vitals filed for this visit.      Subjective Assessment - 11/16/15 1019    Subjective Went to MD and asked about JAS. Pain is 5/10 same as the other day.             [facility] PT Assessment - 11/16/15 1045    Circumferential Edema   Circumferential - Left  17 3/4 inch    AROM   Left Knee Extension 20   Left Knee Flexion 45  AAROM with sheet   Strength   Right Hip Flexion 4/5   Right Hip ABduction 4+/5   Left Hip Flexion 4-/5   Left Hip ABduction 4/5   Left Knee Flexion 3+/5   Left Knee Extension 2-/5  [place] Adult PT Treatment/Exercise - 11/16/15 1037    Knee/Hip Exercises: Stretches   Hip Flexor Stretch Left;3 reps   Hip Flexor Stretch Limitations prolonged stretch with manual to L ant thigh and PROm into knee flexion   done x 1 on R.t side as well    Knee/Hip Exercises: Aerobic   Nustep UE and LE L5 for AAROM, strength, 7 min    Knee/Hip Exercises: Supine   Heel Slides AAROM;Strengthening;Both;1 set;20 reps   Heel Slides Limitations on ball    Bridges Limitations x 10 legs on ball    Single Leg Bridge Strengthening;Both;1 set;10 reps   Knee/Hip Exercises: Sidelying   Hip ABduction Strengthening;Both;1 set;10 reps   Knee/Hip Exercises: Prone   Hip Extension Strengthening;Both;20 reps   Cryotherapy   Number Minutes Cryotherapy 10 Minutes   Cryotherapy Location Knee   Type of Cryotherapy Ice pack   Manual Therapy   Manual Therapy Passive ROM   Edema Management L knee   Joint Mobilization Gr 2-3 flexion and extension    Passive ROM Knee flexion and extension  contract relax x 5                   PT Short Term Goals - 11/09/15 1047    PT SHORT TERM GOAL #1   Title Pt will be I with initial HEP to continue care at home for max outcome   Status Achieved   PT SHORT TERM GOAL #2   Title Pt will be able to demo 25 deg AROM in L knee flex   Status Achieved   PT SHORT TERM GOAL #3   Title Pt will be able to amb x 20 min with 25% less difficulty for shopping, community mobility   Status Achieved           PT Long Term Goals - 11/09/15 1047    PT LONG TERM GOAL #1   Title Pt will report 75% reduction in L LE pain with functional mobility   Status On-going   PT LONG TERM GOAL #2   Title Pt will be able to flex L knee to 45 deg in sitting for ease of transfers   Status Partially Met   PT LONG TERM GOAL #3   Title Pt will be able to extend her knee seated (LAQ) and resist min pressure (3+/5) for gait, transfers.    Baseline improved but 2-/5.    Status On-going   PT LONG TERM GOAL #4   Title Pt will score 60% or less impaired on FOTO to demo improved function.    Baseline improved 58%    Status On-going               Plan - 11/16/15 1059    Clinical Impression Statement Patient renewed today to cont addressing functional gait and maximizing potential for full function.  Discussed options for exercising after this last 4 week period.     Rehab Potential Fair   PT Frequency 2x / week   PT Duration 4 weeks   PT Treatment/Interventions ADLs/Self Care Home Management;Cryotherapy;Electrical Stimulation;Therapeutic exercise;Manual techniques;Vasopneumatic Device;Therapeutic activities;Taping;Functional mobility training;Gait training;Patient/family education;Passive range of motion;Scar mobilization;DME Instruction;Neuromuscular re-education;Balance training   PT Next Visit Plan AAROM, strengthening, manu/soft tissue /PROM, gait training, edema mgmt, JAS?, focus on core    PT Home Exercise  Plan AAROM for knee flexion, has had PT prior, no handouts given on eval, added hip ext, abd and hip flexor stretch    Consulted and Agree  with Plan of Care Patient      Patient will benefit from skilled therapeutic intervention in order to improve the following deficits and impairments:  Abnormal gait, Decreased range of motion, Difficulty walking, Increased fascial restricitons, Obesity, Decreased activity tolerance, Pain, Decreased skin integrity, Decreased balance, Decreased scar mobility, Hypomobility, Impaired flexibility, Improper body mechanics, Impaired sensation, Increased edema, Decreased strength, Decreased mobility  Visit Diagnosis: Stiffness of left knee, not elsewhere classified  Muscle weakness (generalized)  Difficulty in walking, not elsewhere classified  Pain in left knee  Other abnormalities of gait and mobility       G-Codes - 2015-11-22 1056    Functional Assessment Tool Used FOTO    Functional Limitation Mobility: Walking and moving around   Mobility: Walking and Moving Around Current Status [phone]) At least 60 percent but less than 80 percent impaired, limited or restricted   Mobility: Walking and Moving Around Goal Status [phone]) At least 40 percent but less than 60 percent impaired, limited or restricted      Problem List Patient Active Problem List   Diagnosis Date Noted  . Arthrofibrosis of knee joint 08/28/2015  . Left hip pain 10/08/2014  . Type 2 diabetes mellitus without complication ([place]) 71/21/9758  . Hyperlipidemia LDL goal <100 10/08/2014  . Essential hypertension, benign 08/30/2014  . Diabetes mellitus, type 2 ([place]) 08/30/2014  . Diverticulosis of colon without hemorrhage 08/30/2014  . Chronic low back pain 08/30/2014  . Chronic pain syndrome 08/30/2014  . Asthma in adult without complication 83/25/4982  . 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 Osteoarthritis of left knee 02/23/2012     PAA,JENNIFER 11/22/15, 11:02 AM  [facility] [address] [place], [place], [address] Phone: [phone]   Fax:  [phone]  Name: Tabitha Oneal MRN: [id number] Date of Birth: 11/21/1955

## 2015-11-16 NOTE — Patient Instructions (Addendum)
HIP: Flexors - Supine    Lie on edge of surface. Place leg off the surface, allow knee to bend. Bring other knee toward chest. Hold _30-60__ seconds. __2-3_ reps per set, __1_ sets per day, _5__ days per week Rest lowered foot on stool.  Copyright  VHI. All rights reserved.    Hip ext     Lying face down, inhale. Keeping leg straight, exhale while lifting leg just off floor. Hold 1 count. Slowly return to starting position. Repeat ___10-20_ times each leg. Do __1__ sets per session. Do _1___ sessions per day.  Copyright  VHI. All rights reserved.    Abduction: Side Leg Lift (Eccentric) - Side-Lying    Lie on side. Lift top leg slightly higher than shoulder level. Keep top leg straight with body, toes pointing forward. Slowly lower for 3-5 seconds. _10-20__ reps per set, _1__ sets per day, __5_ days per week.   http://ecce.exer.us/63   Copyright  VHI. All rights reserved.

## 2015-11-19 ENCOUNTER — Other Ambulatory Visit: Payer: Self-pay | Admitting: Internal Medicine

## 2015-11-20 ENCOUNTER — Ambulatory Visit: Payer: Medicare Other | Admitting: Physical Therapy

## 2015-11-20 DIAGNOSIS — M25562 Pain in left knee: Secondary | ICD-10-CM

## 2015-11-20 DIAGNOSIS — M25662 Stiffness of left knee, not elsewhere classified: Secondary | ICD-10-CM | POA: Diagnosis not present

## 2015-11-20 DIAGNOSIS — R262 Difficulty in walking, not elsewhere classified: Secondary | ICD-10-CM

## 2015-11-20 DIAGNOSIS — R2689 Other abnormalities of gait and mobility: Secondary | ICD-10-CM

## 2015-11-20 DIAGNOSIS — M6281 Muscle weakness (generalized): Secondary | ICD-10-CM

## 2015-11-20 NOTE — Therapy (Signed)
[facility] [address] [place], [place], [address] Phone: [phone]   Fax:  [phone]  Physical Therapy Treatment  Patient Details  Name: Tabitha Oneal MRN: [id number] Date of Birth: 03/20/1956 Referring Provider: August Saucer   Encounter Date: 11/20/2015      PT End of Session - 11/20/15 1118    Visit Number 13   Number of Visits 24   Date for PT Re-Evaluation 12/17/15   PT Start Time 1106   PT Stop Time 1200   PT Time Calculation (min) 54 min   Activity Tolerance Patient tolerated treatment well   Behavior During Therapy [facility] for tasks assessed/performed      Past Medical History  Diagnosis Date  . Osteoarthritis of left knee 02/23/2012  . Hard of hearing   . Asthma     QVAR daily and Albuterol as needed  . GERD (gastroesophageal reflux disease)     takes Omeprazole daily  . Seasonal allergies     takes Claritin and Singulair daily.Nasal spray as needed  . Hyperlipidemia     takes Atorvastatin daily  . Diabetes mellitus     takes Metformin and Tradjenta daily  . Hypertension     takes Quinapril daily  . Bladder infection     taking Keflex daily   . Shortness of breath dyspnea     with exertion  . Headache     daily.Takes Excedrine daily  . Joint pain   . Joint swelling   . Chronic back pain     reason unknown  . Nocturia   . History of blood transfusion     no abnormal reaction noted    Past Surgical History  Procedure Laterality Date  . Joint replacement      acl   bil knees  . Abdominal hysterectomy    . Total knee arthroplasty  02/23/2012    Procedure: TOTAL KNEE ARTHROPLASTY;  Surgeon: Eulas Post, MD;  [facility];  Service: Orthopedics;  Laterality: Left;  . Knee closed reduction  04/09/2012    Procedure: CLOSED MANIPULATION KNEE;  Surgeon: Eulas Post, MD;  Location: [facility];  Service: Orthopedics;  Laterality: Left;  Manipulation Knee with Anesthesia includes  Application of Traction   . Injection knee  04/09/2012    Procedure: KNEE INJECTION;  Surgeon: Eulas Post, MD;  Location: [facility];  Service: Orthopedics;  Laterality: Left;  . Colonoscopy    . Knee arthrotomy Left 08/28/2015    Procedure: LEFT KNEE ARTHROFIBROSIS EXCISION; pylectomy;  Surgeon: Cammy Copa, MD;  Location: [facility];  Service: Orthopedics;  Laterality: Left;    There were no vitals filed for this visit.      Subjective Assessment - 11/20/15 1115    Subjective No new changes, 6/10.  Took meds.    Currently in Pain? Yes   Pain Score 6    Pain Location Knee   Pain Orientation Left   Pain Descriptors / Indicators Aching   Pain Type Chronic pain;Surgical pain   Pain Onset More than a month ago   Pain Frequency Constant              [facility] Adult PT Treatment/Exercise - 11/20/15 1119    Ambulation/Gait   Ambulation/Gait Yes   Ambulation/Gait Assistance 5: Supervision   Ambulation/Gait Assistance Details came in with cane    Ambulation Distance (Feet) 150 Feet   Assistive device Small based quad cane   Gait  Pattern Decreased step length - left;Decreased hip/knee flexion - left;Left circumduction;Left hip hike   Ambulation Surface Level;Indoor   Pre-Gait Activities weightshifting   no UE support, very nervous   Gait Comments used RW as well to smooth out gait, catches her L toes at times   Self-Care   Other Self-Care Comments  Worked through process on donning and donning pants, she does in standing as she leans back onto the bed slightly.  Rec she do in stting for safety.  She long sits to put on her socks.    Knee/Hip Exercises: Aerobic   Nustep LE only L5 for 6 min moved seat closer to increase flexion   Knee/Hip Exercises: Standing   Heel Raises Both;1 set;20 reps   Forward Lunges Left;Both;5 reps;Other (comment)   Forward Lunges Limitations 30 sec hold    Hip Abduction Stengthening;Both;1 set;15 reps   Abduction Limitations on foam     Functional Squat 2 sets;10 reps   Functional Squat Limitations on foam    Other Standing Knee Exercises static blance and narrow BOS   Other Standing Knee Exercises stand DF min UE support    Cryotherapy   Number Minutes Cryotherapy 10 Minutes   Cryotherapy Location Knee   Type of Cryotherapy Ice pack                PT Education - 11/20/15 1313    Education provided Yes   Education Details safety with ADLs, gait    Person(s) Educated Patient   Methods Explanation;Demonstration   Comprehension Verbalized understanding;Returned demonstration;Need further instruction          PT Short Term Goals - 11/09/15 1047    PT SHORT TERM GOAL #1   Title Pt will be I with initial HEP to continue care at home for max outcome   Status Achieved   PT SHORT TERM GOAL #2   Title Pt will be able to demo 25 deg AROM in L knee flex   Status Achieved   PT SHORT TERM GOAL #3   Title Pt will be able to amb x 20 min with 25% less difficulty for shopping, community mobility   Status Achieved           PT Long Term Goals - 11/09/15 1047    PT LONG TERM GOAL #1   Title Pt will report 75% reduction in L LE pain with functional mobility   Status On-going   PT LONG TERM GOAL #2   Title Pt will be able to flex L knee to 45 deg in sitting for ease of transfers   Status Partially Met   PT LONG TERM GOAL #3   Title Pt will be able to extend her knee seated (LAQ) and resist min pressure (3+/5) for gait, transfers.    Baseline improved but 2-/5.    Status On-going   PT LONG TERM GOAL #4   Title Pt will score 60% or less impaired on FOTO to demo improved function.    Baseline improved 58%    Status On-going               Plan - 11/20/15 1314    Clinical Impression Statement Patient challenged in standing and gait post reflected that.  She needed mod HHA and min guard A with quad cane.  Recommended RW when she is in pain, tired.  Addressed affect of knee stiffness on back pain, ways  to minimize with ADLs.     PT Next Visit  Plan try gait with RW and shoe cover to reduce friction.  Encourage hip, knee flexion and smooth progressing,  Core, hip and functional strength   PT Home Exercise Plan AAROM for knee flexion, has had PT prior, no handouts given on eval, added hip ext, abd and hip flexor stretch    Consulted and Agree with Plan of Care Patient      Patient will benefit from skilled therapeutic intervention in order to improve the following deficits and impairments:  Abnormal gait, Decreased range of motion, Difficulty walking, Increased fascial restricitons, Obesity, Decreased activity tolerance, Pain, Decreased skin integrity, Decreased balance, Decreased scar mobility, Hypomobility, Impaired flexibility, Improper body mechanics, Impaired sensation, Increased edema, Decreased strength, Decreased mobility  Visit Diagnosis: Stiffness of left knee, not elsewhere classified  Muscle weakness (generalized)  Difficulty in walking, not elsewhere classified  Pain in left knee  Other abnormalities of gait and mobility     Problem List Patient Active Problem List   Diagnosis Date Noted  . Arthrofibrosis of knee joint 08/28/2015  . Left hip pain 10/08/2014  . Type 2 diabetes mellitus without complication ([place]) 37/29/0211  . Hyperlipidemia LDL goal <100 10/08/2014  . Essential hypertension, benign 08/30/2014  . Diabetes mellitus, type 2 ([place]) 08/30/2014  . Diverticulosis of colon without hemorrhage 08/30/2014  . Chronic low back pain 08/30/2014  . Chronic pain syndrome 08/30/2014  . Asthma in adult without complication 15/52/0802  . 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 Osteoarthritis of left knee 02/23/2012    PAA,JENNIFER 11/20/2015, 1:17 PM  [facility] [address] [place], [place], [address] Phone: [phone]   Fax:  [phone]  Name: ANALINA FILLA MRN:  [id number] Date of Birth: September 06, 1955    Raeford Razor, PT 11/20/2015 1:17 PM Phone: [phone] Fax: [phone]

## 2015-11-21 ENCOUNTER — Other Ambulatory Visit: Payer: Medicare Other

## 2015-11-21 DIAGNOSIS — I10 Essential (primary) hypertension: Secondary | ICD-10-CM | POA: Diagnosis not present

## 2015-11-21 DIAGNOSIS — E119 Type 2 diabetes mellitus without complications: Secondary | ICD-10-CM

## 2015-11-21 DIAGNOSIS — E785 Hyperlipidemia, unspecified: Secondary | ICD-10-CM

## 2015-11-21 DIAGNOSIS — R300 Dysuria: Secondary | ICD-10-CM

## 2015-11-22 ENCOUNTER — Ambulatory Visit: Payer: Medicare Other | Admitting: Physical Therapy

## 2015-11-22 DIAGNOSIS — R262 Difficulty in walking, not elsewhere classified: Secondary | ICD-10-CM

## 2015-11-22 DIAGNOSIS — M6281 Muscle weakness (generalized): Secondary | ICD-10-CM

## 2015-11-22 DIAGNOSIS — M25662 Stiffness of left knee, not elsewhere classified: Secondary | ICD-10-CM | POA: Diagnosis not present

## 2015-11-22 DIAGNOSIS — R2689 Other abnormalities of gait and mobility: Secondary | ICD-10-CM

## 2015-11-22 DIAGNOSIS — M25562 Pain in left knee: Secondary | ICD-10-CM

## 2015-11-22 LAB — COMPREHENSIVE METABOLIC PANEL
ALT: 9 IU/L (ref 0–32)
AST: 13 IU/L (ref 0–40)
Albumin/Globulin Ratio: 1.4 (ref 1.2–2.2)
Albumin: 4.4 g/dL (ref 3.6–4.8)
Alkaline Phosphatase: 182 IU/L — ABNORMAL HIGH (ref 39–117)
BILIRUBIN TOTAL: 0.3 mg/dL (ref 0.0–1.2)
BUN/Creatinine Ratio: 11 — ABNORMAL LOW (ref 12–28)
BUN: 8 mg/dL (ref 8–27)
CO2: 27 mmol/L (ref 18–29)
Calcium: 9.6 mg/dL (ref 8.7–10.3)
Chloride: 103 mmol/L (ref 96–106)
Creatinine, Ser: 0.75 mg/dL (ref 0.57–1.00)
GFR calc non Af Amer: 87 mL/min/{1.73_m2} (ref >59)
GFR, EST AFRICAN AMERICAN: 100 mL/min/{1.73_m2} (ref >59)
Globulin, Total: 3.1 g/dL (ref 1.5–4.5)
Glucose: 130 mg/dL — ABNORMAL HIGH (ref 65–99)
Potassium: 4.1 mmol/L (ref 3.5–5.2)
Sodium: 144 mmol/L (ref 134–144)
TOTAL PROTEIN: 7.5 g/dL (ref 6.0–8.5)

## 2015-11-22 LAB — URINALYSIS
BILIRUBIN UA: NEGATIVE
GLUCOSE, UA: NEGATIVE
KETONES UA: NEGATIVE
Leukocytes, UA: NEGATIVE
Nitrite, UA: NEGATIVE
Protein, UA: NEGATIVE
RBC, UA: NEGATIVE
SPEC GRAV UA: 1.028 (ref 1.005–1.030)
UUROB: 0.2 mg/dL (ref 0.2–1.0)
pH, UA: 6 (ref 5.0–7.5)

## 2015-11-22 LAB — MICROALBUMIN, URINE: Microalbumin, Urine: 15.8 ug/mL

## 2015-11-22 LAB — LIPID PANEL
Chol/HDL Ratio: 2.5 ratio units (ref 0.0–4.4)
Cholesterol, Total: 140 mg/dL (ref 100–199)
HDL: 55 mg/dL (ref >39)
LDL Calculated: 74 mg/dL (ref 0–99)
Triglycerides: 55 mg/dL (ref 0–149)
VLDL Cholesterol Cal: 11 mg/dL (ref 5–40)

## 2015-11-22 LAB — HEMOGLOBIN A1C
ESTIMATED AVERAGE GLUCOSE: 137 mg/dL
HEMOGLOBIN A1C: 6.4 % — AB (ref 4.8–5.6)

## 2015-11-22 NOTE — Patient Instructions (Addendum)
   A 28 B 21 1/2 C 15 1/2 D 1/4 E13 F 15 1/4

## 2015-11-22 NOTE — Therapy (Signed)
Tabitha Oneal, [place], [address] Phone: [phone]   Fax:  [phone]  Physical Therapy Treatment  Patient Details  Name: Tabitha Oneal MRN: [id number] Date of Birth: 1956-02-24 Referring Provider: Marlou Oneal   Encounter Date: 11/22/2015      PT End of Session - 11/22/15 1255    Visit Number 14   Number of Visits 24   Date for PT Re-Evaluation 12/17/15   PT Start Time 1017   PT Stop Time 1110   PT Time Calculation (min) 53 min   Activity Tolerance Patient tolerated treatment well   Behavior During Therapy [facility] for tasks assessed/performed      Past Medical History  Diagnosis Date  . Osteoarthritis of left knee 02/23/2012  . Hard of hearing   . Asthma     QVAR daily and Albuterol as needed  . GERD (gastroesophageal reflux disease)     takes Omeprazole daily  . Seasonal allergies     takes Claritin and Singulair daily.Nasal spray as needed  . Hyperlipidemia     takes Atorvastatin daily  . Diabetes mellitus     takes Metformin and Tradjenta daily  . Hypertension     takes Quinapril daily  . Bladder infection     taking Keflex daily   . Shortness of breath dyspnea     with exertion  . Headache     daily.Takes Excedrine daily  . Joint pain   . Joint swelling   . Chronic back pain     reason unknown  . Nocturia   . History of blood transfusion     no abnormal reaction noted    Past Surgical History  Procedure Laterality Date  . Joint replacement      acl   bil knees  . Abdominal hysterectomy    . Total knee arthroplasty  02/23/2012    Procedure: TOTAL KNEE ARTHROPLASTY;  Surgeon: Tabitha Bridge, MD;  Location: [place];  Service: Orthopedics;  Laterality: Left;  . Knee closed reduction  04/09/2012    Procedure: CLOSED MANIPULATION KNEE;  Surgeon: Tabitha Bridge, MD;  Location: [place];  Service: Orthopedics;  Laterality: Left;  Manipulation Knee with Anesthesia includes  Application of Traction   . Injection knee  04/09/2012    Procedure: KNEE INJECTION;  Surgeon: Tabitha Bridge, MD;  Location: [place];  Service: Orthopedics;  Laterality: Left;  . Colonoscopy    . Knee arthrotomy Left 08/28/2015    Procedure: LEFT KNEE ARTHROFIBROSIS EXCISION; pylectomy;  Surgeon: Tabitha Pel, MD;  Location: [place];  Service: Orthopedics;  Laterality: Left;    There were no vitals filed for this visit.      Subjective Assessment - 11/22/15 1026    Subjective Walks in with walker today.  Dr. Marlou Oneal said he thinks a JAS would benefit Tabitha Oneal   Currently in Pain? Yes   Pain Score 6    Pain Location Knee   Pain Orientation Left;Anterior;Lateral   Pain Descriptors / Indicators Aching;Tightness;Contraction;Numbness   Pain Type Chronic pain;Surgical pain   Pain Onset More than a month ago   Pain Frequency Constant           [facility] Adult PT Treatment/Exercise - 11/22/15 1042    Ambulation/Gait   Gait Comments worked in parallel bars on forward and retro walking, emphasizing hip and knee flexion, smooth gait pattern.  Used shoe cover to reduce friction on L foot with  swing phase.    Self-Care   Other Self-Care Comments  measured for JAS for L knee flexion    Knee/Hip Exercises: Standing   Hip Flexion AAROM;Stengthening;Both;1 set;2 sets;20 reps   Hip Flexion Limitations parallel bars    Forward Lunges Left;5 sets   Side Lunges Both;1 set;10 reps   Hip Extension Stengthening;Both;1 set;20 reps;Knee bent   Forward Step Up Left;2 sets;Hand Hold: 2;Step Height: 4"   Forward Step Up Limitations knee control mod max cues    Cryotherapy   Number Minutes Cryotherapy 10 Minutes   Cryotherapy Location Knee   Type of Cryotherapy Ice pack                PT Education - 11/22/15 1255    Education provided Yes   Education Details JAS, gait    Person(s) Educated Patient   Methods Explanation;Demonstration;Tactile cues;Verbal cues    Comprehension Verbalized understanding;Returned demonstration;Need further instruction          PT Short Term Goals - 11/09/15 1047    PT SHORT TERM GOAL #1   Title Pt will be I with initial HEP to continue care at home for max outcome   Status Achieved   PT SHORT TERM GOAL #2   Title Pt will be able to demo 25 deg AROM in L knee flex   Status Achieved   PT SHORT TERM GOAL #3   Title Pt will be able to amb x 20 min with 25% less difficulty for shopping, community mobility   Status Achieved           PT Long Term Goals - 11/09/15 1047    PT LONG TERM GOAL #1   Title Pt will report 75% reduction in L LE pain with functional mobility   Status On-going   PT LONG TERM GOAL #2   Title Pt will be able to flex L knee to 45 deg in sitting for ease of transfers   Status Partially Met   PT LONG TERM GOAL #3   Title Pt will be able to extend her knee seated (LAQ) and resist min pressure (3+/5) for gait, transfers.    Baseline improved but 2-/5.    Status On-going   PT LONG TERM GOAL #4   Title Pt will score 60% or less impaired on FOTO to demo improved function.    Baseline improved 58%    Status On-going               Plan - 11/22/15 1255    Clinical Impression Statement Worked in parallel bars on gait and pre-gait for normal movement patterns.  Pt stiff and weak in L hip and lknee.  RW reduced her back pain today.  Pt cont to ask questions about swelling and prognosis, referred to MD. Measurements sent to rep.    PT Next Visit Plan try gait with RW and shoe cover to reduce friction.  Encourage hip, knee flexion and smooth progressing,  Core, hip and functional strength   PT Home Exercise Plan AAROM for knee flexion, has had PT prior, no handouts given on eval, added hip ext, abd and hip flexor stretch    Consulted and Agree with Plan of Care Patient      Patient will benefit from skilled therapeutic intervention in order to improve the following deficits and impairments:   Abnormal gait, Decreased range of motion, Difficulty walking, Increased fascial restricitons, Obesity, Decreased activity tolerance, Pain, Decreased skin integrity, Decreased balance, Decreased scar mobility,  Hypomobility, Impaired flexibility, Improper body mechanics, Impaired sensation, Increased edema, Decreased strength, Decreased mobility  Visit Diagnosis: Stiffness of left knee, not elsewhere classified  Muscle weakness (generalized)  Difficulty in walking, not elsewhere classified  Pain in left knee  Other abnormalities of gait and mobility     Problem List Patient Active Problem List   Diagnosis Date Noted  . Arthrofibrosis of knee joint 08/28/2015  . Left hip pain 10/08/2014  . Type 2 diabetes mellitus without complication ([place]) 39/68/8648  . Hyperlipidemia LDL goal <100 10/08/2014  . Essential hypertension, benign 08/30/2014  . Diabetes mellitus, type 2 ([place]) 08/30/2014  . Diverticulosis of colon without hemorrhage 08/30/2014  . Chronic low back pain 08/30/2014  . Chronic pain syndrome 08/30/2014  . Asthma in adult without complication 47/20/7218  . 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 Osteoarthritis of left knee 02/23/2012    Tabitha Oneal 11/22/2015, 1:10 PM  [facility] [address] [place], [place], [address] Phone: [phone]   Fax:  [phone]  Name: Tabitha Oneal MRN: [id number] Date of Birth: Jun 13, 1955    Raeford Razor, PT 11/22/2015 1:11 PM Phone: [phone] Fax: [phone]

## 2015-11-23 ENCOUNTER — Ambulatory Visit (INDEPENDENT_AMBULATORY_CARE_PROVIDER_SITE_OTHER): Payer: Medicare Other | Admitting: Internal Medicine

## 2015-11-23 ENCOUNTER — Encounter: Payer: Self-pay | Admitting: Gastroenterology

## 2015-11-23 ENCOUNTER — Other Ambulatory Visit: Payer: Self-pay | Admitting: Internal Medicine

## 2015-11-23 ENCOUNTER — Encounter: Payer: Self-pay | Admitting: Internal Medicine

## 2015-11-23 VITALS — BP 122/78 | HR 75 | Temp 98.3°F | Ht 63.0 in | Wt 203.8 lb

## 2015-11-23 DIAGNOSIS — R0989 Other specified symptoms and signs involving the circulatory and respiratory systems: Secondary | ICD-10-CM

## 2015-11-23 DIAGNOSIS — R011 Cardiac murmur, unspecified: Secondary | ICD-10-CM | POA: Diagnosis not present

## 2015-11-23 DIAGNOSIS — Z1211 Encounter for screening for malignant neoplasm of colon: Secondary | ICD-10-CM | POA: Diagnosis not present

## 2015-11-23 DIAGNOSIS — M25562 Pain in left knee: Secondary | ICD-10-CM | POA: Diagnosis not present

## 2015-11-23 DIAGNOSIS — E785 Hyperlipidemia, unspecified: Secondary | ICD-10-CM

## 2015-11-23 DIAGNOSIS — I10 Essential (primary) hypertension: Secondary | ICD-10-CM | POA: Diagnosis not present

## 2015-11-23 DIAGNOSIS — E119 Type 2 diabetes mellitus without complications: Secondary | ICD-10-CM | POA: Diagnosis not present

## 2015-11-23 DIAGNOSIS — J45909 Unspecified asthma, uncomplicated: Secondary | ICD-10-CM

## 2015-11-23 DIAGNOSIS — R1013 Epigastric pain: Secondary | ICD-10-CM | POA: Diagnosis not present

## 2015-11-23 MED ORDER — TETANUS-DIPHTH-ACELL PERTUSSIS 5-2.5-18.5 LF-MCG/0.5 IM SUSP: 0.5000 mL | Freq: Once | INTRAMUSCULAR | Status: DC

## 2015-11-23 MED ORDER — ZOSTER VACCINE LIVE 19400 UNT/0.65ML ~~LOC~~ SUSR: 0.6500 mL | Freq: Once | SUBCUTANEOUS | Status: DC

## 2015-11-23 NOTE — Progress Notes (Signed)
Patient ID: Tabitha Oneal, female   DOB: Dec 17, 1955, 60 y.o.   MRN: [id number]    Location:  [facility] Place of Service: OFFICE  Chief Complaint  Patient presents with  . Medical Management of Chronic Issues    3 month follow up. DM foot exam due  . OTHER    would like to discuss advance directive  . Referral    GI referral for colonoscopy    HPI:  60 yo female seen today for f/u.   DM - she does not check BS on a regular basis. Last BS 122. Controlled overall. She takes metformin and tradjenta. No low BS reactions. Occasional tingling in left foot. A1c 6.4%. Random urine microalbumin 15.8  HTN - BP controlled on quinapril  Hyperlipidemia - noticed increased HA and myalgias with lower dose of statin. LDL 74.   Asthma - followed by asthma specialist. She takes singulair, Qvar, prn albuterol HFA and astelin spray. She has sinus HA and post nasal drip in AM.   OA/chronic pain - continues to have left knee pain despite having knee surgery in April 2017. She is followed by Ortho Dr Nicki Reaper. She wears left knee brace. Taking PT. Exercise tolerance low due to pain. Constipation stable on amitiza. She takes oxycodone TID, baclofen and meloxicam. She wonders if she can use voltaren gel which she has not used in a while. She uses a cane to ambulate  Past Medical History  Diagnosis Date  . Osteoarthritis of left knee 02/23/2012  . Hard of hearing   . Asthma     QVAR daily and Albuterol as needed  . GERD (gastroesophageal reflux disease)     takes Omeprazole daily  . Seasonal allergies     takes Claritin and Singulair daily.Nasal spray as needed  . Hyperlipidemia     takes Atorvastatin daily  . Diabetes mellitus     takes Metformin and Tradjenta daily  . Hypertension     takes Quinapril daily  . Bladder infection     taking Keflex daily   . Shortness of breath dyspnea     with exertion  . Headache     daily.Takes Excedrine daily  . Joint pain   . Joint swelling   . Chronic back  pain     reason unknown  . Nocturia   . History of blood transfusion     no abnormal reaction noted    Past Surgical History  Procedure Laterality Date  . Joint replacement      acl   bil knees  . Abdominal hysterectomy    . Total knee arthroplasty  02/23/2012    Procedure: TOTAL KNEE ARTHROPLASTY;  Surgeon: Johnny Bridge, MD;  Location: [facility];  Service: Orthopedics;  Laterality: Left;  . Knee closed reduction  04/09/2012    Procedure: CLOSED MANIPULATION KNEE;  Surgeon: Johnny Bridge, MD;  Location: [place];  Service: Orthopedics;  Laterality: Left;  Manipulation Knee with Anesthesia includes Application of Traction   . Injection knee  04/09/2012    Procedure: KNEE INJECTION;  Surgeon: Johnny Bridge, MD;  Location: [facility];  Service: Orthopedics;  Laterality: Left;  . Colonoscopy    . Knee arthrotomy Left 08/28/2015    Procedure: LEFT KNEE ARTHROFIBROSIS EXCISION; pylectomy;  Surgeon: Meredith Pel, MD;  Location: [facility];  Service: Orthopedics;  Laterality: Left;    Patient Care Team: Gildardo Cranker, DO as PCP - General (Internal Medicine)  Social History  Social History  . Marital Status: Widowed    Spouse Name: N/A  . Number of Children: N/A  . Years of Education: N/A   Occupational History  . Not on file.   Social History Main Topics  . Smoking status: Never Smoker   . Smokeless tobacco: Never Used  . Alcohol Use: No  . Drug Use: No  . Sexual Activity: Not on file   Other Topics Concern  . Not on file   Social History Narrative   Diet: none   Do you drink/eat things with caffeine ? Coffee    Material status: widow     What year were you married? 08/01/1978   Do you live in a house, apartment, assisted living,condo, trailer,ect.)? Townhouse (temporary)    Is it one or more stories? Yes   How many persons live in your home? Two   Do you have any pets in your home ? Yes, Shih-tzu   Current or past profession: none   Do  you exercise? No  Type & how often: no   Do you have a living will ? No   Do you have a DNR form? No   If not, do you want to discuss one?  Not now   Do you have signed POA /HPOA forms? No    If so, please bring to your appointment.                                      reports that she has never smoked. She has never used smokeless tobacco. She reports that she does not drink alcohol or use illicit drugs.  Family History  Problem Relation Age of Onset  . Bronchitis Father 21  . Alzheimer's disease Mother   . Diabetes Daughter   . Diabetes Daughter   . Diabetes     Family Status  Relation Status Death Age  . Father Deceased 73  . Mother Alive   . Sister Alive   . Sister Alive   . Sister Alive   . Brother Alive   . Sister Alive   . Sister Alive   . Sister Alive   . Son Alive   . Daughter Alive   . Daughter Alive   . Daughter Alive      No Known Allergies  Medications: Patient's Medications  New Prescriptions   No medications on file  Previous Medications   ALBUTEROL (PROVENTIL HFA;VENTOLIN HFA) 108 (90 BASE) MCG/ACT INHALER    Inhale 2 puffs into the lungs every 4 (four) hours as needed. For wheezing   AMITIZA 24 MCG CAPSULE    Take 1 capsule (24 mcg total) by mouth 2 (two) times daily with a meal.   ASPIRIN-ACETAMINOPHEN-CAFFEINE (EXCEDRIN MIGRAINE) 250-250-65 MG PER TABLET    Take 2 tablets by mouth daily.   ATORVASTATIN (LIPITOR) 40 MG TABLET    Take 1 tablet (40 mg total) by mouth daily.   AZELASTINE (ASTELIN) 0.1 % NASAL SPRAY    Use 1 spray each nostril twice daily   BACLOFEN (LIORESAL) 10 MG TABLET    Take 10 mg by mouth. 1 in morning 2 at bed time   BECLOMETHASONE (QVAR) 80 MCG/ACT INHALER    Use 2 puffs twice daily rinse, gargle and spit after use   [employer] LORATADINE 10 MG TABLET    TAKE 1 TABLET EVERY DAY   DICLOFENAC SODIUM (VOLTAREN) 1 % GEL  Apply 8 g topically 2 (two) times daily as needed. On knees   LINAGLIPTIN (TRADJENTA) 5 MG TABS TABLET     Take 1 tablet (5 mg total) by mouth daily.   LINAGLIPTIN (TRADJENTA) 5 MG TABS TABLET    Take 1 tablet (5 mg total) by mouth daily.   METFORMIN (GLUCOPHAGE) 500 MG TABLET    Take one tablet by mouth twice daily to control blood sugar   MONTELUKAST (SINGULAIR) 10 MG TABLET    Take 1 tablet (10 mg total) by mouth at bedtime.   OMEPRAZOLE (PRILOSEC) 40 MG CAPSULE    TAKE 1 CAPSULE EVERY DAY   ONE TOUCH ULTRA TEST TEST STRIP    Check blood sugar once daily as directed   OXYCODONE HCL 10 MG TABS    Take 1 tablet (10 mg total) by mouth 4 (four) times daily as needed.   QUINAPRIL (ACCUPRIL) 20 MG TABLET    TAKE 1 TABLET BY MOUTH AT BEDTIME   TRADJENTA 5 MG TABS TABLET    TAKE 1 TABLET BY MOUTH EVERY DAY  Modified Medications   Modified Medication Previous Medication   TDAP (BOOSTRIX) 5-2.5-18.5 LF-MCG/0.5 INJECTION Tdap (BOOSTRIX) 5-2.5-18.5 LF-MCG/0.5 injection      Inject 0.5 mLs into the muscle once.    Inject 0.5 mLs into the muscle once.   ZOSTER VACCINE LIVE, PF, (ZOSTAVAX) 60454 UNT/0.65ML INJECTION Zoster Vaccine Live, PF, (ZOSTAVAX) 09811 UNT/0.65ML injection      Inject 19,400 Units into the skin once.    Inject 0.65 mLs into the skin once.  Discontinued Medications   OXYCODONE (OXYCONTIN) 10 MG 12 HR TABLET    Take 1 tablet (10 mg total) by mouth every 12 (twelve) hours.    Review of Systems  Unable to perform ROS: Other    Filed Vitals:   11/23/15 1121  BP: 122/78  Pulse: 75  Temp: 98.3 F (36.8 C)  TempSrc: Oral  Height: 5\' 3"  (1.6 m)  Weight: 203 lb 12.8 oz (92.443 kg)  SpO2: 97%   Body mass index is 36.11 kg/(m^2).  Physical Exam  Constitutional: She is oriented to person, place, and time. She appears well-developed and well-nourished.  Looks uncomfortable in NAD  HENT:  Mouth/Throat: Oropharynx is clear and moist. No oropharyngeal exudate.  Eyes: Pupils are equal, round, and reactive to light. No scleral icterus.  Neck: Neck supple. Carotid bruit is present (b/l  systolic). No tracheal deviation present. No thyromegaly present.  Cardiovascular: Normal rate, regular rhythm and intact distal pulses.  Exam reveals no gallop and no friction rub.   Murmur (2/6 SEM --> carotid b/l) heard. +1 pitting LE edema b/l. no calf TTP.   Pulmonary/Chest: Effort normal and breath sounds normal. No stridor. No respiratory distress. She has no wheezes. She has no rales.  Abdominal: Soft. Bowel sounds are normal. She exhibits no distension and no mass. There is no hepatomegaly. There is no tenderness. There is no rebound and no guarding.  Musculoskeletal: She exhibits edema and tenderness.  L>R knee swelling with increased warmth to touch. Left knee immobilizer intact; uses cane to ambulate; gait antalgic  Lymphadenopathy:    She has no cervical adenopathy.  Neurological: She is alert and oriented to person, place, and time.  Skin: Skin is warm and dry. No rash noted.  Psychiatric: She has a normal mood and affect. Her behavior is normal. Judgment and thought content normal. Her speech is slurred.     Labs reviewed: Appointment on 11/21/2015  Component  Date Value Ref Range Status  . Microalbum.,U,Random 11/21/2015 15.8  Not Estab. ug/mL Final  . Specific Gravity, UA 11/21/2015 1.028  1.005 - 1.030 Final  . pH, UA 11/21/2015 6.0  5.0 - 7.5 Final  . Color, UA 11/21/2015 Yellow  Yellow Final  . Appearance Ur 11/21/2015 Clear  Clear Final  . Leukocytes, UA 11/21/2015 Negative  Negative Final  . Protein, UA 11/21/2015 Negative  Negative/Trace Final  . Glucose, UA 11/21/2015 Negative  Negative Final  . Ketones, UA 11/21/2015 Negative  Negative Final  . RBC, UA 11/21/2015 Negative  Negative Final  . Bilirubin, UA 11/21/2015 Negative  Negative Final  . Urobilinogen, Ur 11/21/2015 0.2  0.2 - 1.0 mg/dL Final  . Nitrite, UA 11/21/2015 Negative  Negative Final  . Hgb A1c MFr Bld 11/21/2015 6.4* 4.8 - 5.6 % Final   Comment:          Pre-diabetes: 5.7 - 6.4           Diabetes: >6.4          Glycemic control for adults with diabetes: <7.0   . Est. average glucose Bld gHb Est-m* 11/21/2015 137   Final  . Glucose 11/21/2015 130* 65 - 99 mg/dL Final  . BUN 11/21/2015 8  8 - 27 mg/dL Final  . Creatinine, Ser 11/21/2015 0.75  0.57 - 1.00 mg/dL Final  . GFR calc non Af Amer 11/21/2015 87  >59 mL/min/1.73 Final  . GFR calc Af Amer 11/21/2015 100  >59 mL/min/1.73 Final  . BUN/Creatinine Ratio 11/21/2015 11* 12 - 28 Final  . Sodium 11/21/2015 144  134 - 144 mmol/L Final  . Potassium 11/21/2015 4.1  3.5 - 5.2 mmol/L Final  . Chloride 11/21/2015 103  96 - 106 mmol/L Final  . CO2 11/21/2015 27  18 - 29 mmol/L Final  . Calcium 11/21/2015 9.6  8.7 - 10.3 mg/dL Final  . Total Protein 11/21/2015 7.5  6.0 - 8.5 g/dL Final  . Albumin 11/21/2015 4.4  3.6 - 4.8 g/dL Final  . Globulin, Total 11/21/2015 3.1  1.5 - 4.5 g/dL Final  . Albumin/Globulin Ratio 11/21/2015 1.4  1.2 - 2.2 Final  . Bilirubin Total 11/21/2015 0.3  0.0 - 1.2 mg/dL Final  . Alkaline Phosphatase 11/21/2015 182* 39 - 117 IU/L Final  . AST 11/21/2015 13  0 - 40 IU/L Final  . ALT 11/21/2015 9  0 - 32 IU/L Final  . Cholesterol, Total 11/21/2015 140  100 - 199 mg/dL Final  . Triglycerides 11/21/2015 55  0 - 149 mg/dL Final  . HDL 11/21/2015 55  >39 mg/dL Final  . VLDL Cholesterol Cal 11/21/2015 11  5 - 40 mg/dL Final  . LDL Calculated 11/21/2015 74  0 - 99 mg/dL Final  . Chol/HDL Ratio 11/21/2015 2.5  0.0 - 4.4 ratio units Final   Comment:                                   T. Chol/HDL Ratio                                             Men  Women  1/2 Avg.Risk  3.4    3.3                                   Avg.Risk  5.0    4.4                                2X Avg.Risk  9.6    7.1                                3X Avg.Risk 23.4   11.0   Admission on 08/28/2015, Discharged on 08/30/2015  Component Date Value Ref Range Status  . Glucose-Capillary 08/28/2015 110* 65 - 99 mg/dL  Final  . Glucose-Capillary 08/28/2015 91  65 - 99 mg/dL Final    No results found.   Assessment/Plan   ICD-9-CM ICD-10-CM   1. Left knee pain 719.46 M25.562   2. Type 2 diabetes mellitus without complication, without long-term current use of insulin (HCC) 250.00 E11.9 Echocardiogram     Carotid  3. Essential hypertension, benign 401.1 I10   4. Hyperlipidemia LDL goal <100 272.4 E78.5 Echocardiogram     Carotid  5. Asthma in adult without complication 123456 [id number]   6. Bilateral carotid bruits 785.9 R09.89 Carotid  7. Heart murmur 785.2 R01.1 Echocardiogram  8. Dyspepsia 536.8 R10.13   9. Colon cancer screening V76.51 Z12.11 Ambulatory referral to Gastroenterology   Recommend you speak with Ortho Dr Bary Leriche office regarding voltaren gel use after recent procedure  Will call with GI referral for colonoscopy  Continue current medications as ordered  Follow up with specialists as scheduled  Will call with heart echo and neck ultrasound appts  Follow up in 75mos for CPE. Fasting labs prior to appt  [place] S. Perlie Gold  [facility] [address] [place], [place] [address] [phone] Cell (Monday-Friday 8 AM - 5 PM) [phone] After 5 PM and follow prompts

## 2015-11-23 NOTE — Patient Instructions (Addendum)
Recommend you speak with Ortho Dr Bary Leriche office regarding voltaren gel use after recent procedure  Will call with GI referral for colonoscopy  Continue current medications as ordered  Follow up with specialists as scheduled  Will call with heart echo and neck ultrasound appts  Follow up in 14mos for CPE. Fasting labs prior to appt

## 2015-11-25 ENCOUNTER — Other Ambulatory Visit: Payer: Self-pay | Admitting: Internal Medicine

## 2015-11-29 ENCOUNTER — Ambulatory Visit: Payer: Medicare Other | Attending: Orthopedic Surgery | Admitting: Physical Therapy

## 2015-11-29 ENCOUNTER — Encounter: Payer: Self-pay | Admitting: Allergy and Immunology

## 2015-11-29 ENCOUNTER — Ambulatory Visit (INDEPENDENT_AMBULATORY_CARE_PROVIDER_SITE_OTHER): Payer: Medicare Other | Admitting: Allergy and Immunology

## 2015-11-29 VITALS — BP 136/76 | HR 70 | Temp 98.5°F | Resp 16

## 2015-11-29 DIAGNOSIS — R269 Unspecified abnormalities of gait and mobility: Secondary | ICD-10-CM | POA: Diagnosis not present

## 2015-11-29 DIAGNOSIS — H1010 Acute atopic conjunctivitis, unspecified eye: Secondary | ICD-10-CM | POA: Diagnosis not present

## 2015-11-29 DIAGNOSIS — M79605 Pain in left leg: Secondary | ICD-10-CM | POA: Insufficient documentation

## 2015-11-29 DIAGNOSIS — M25562 Pain in left knee: Secondary | ICD-10-CM | POA: Diagnosis not present

## 2015-11-29 DIAGNOSIS — R262 Difficulty in walking, not elsewhere classified: Secondary | ICD-10-CM | POA: Diagnosis not present

## 2015-11-29 DIAGNOSIS — M6281 Muscle weakness (generalized): Secondary | ICD-10-CM | POA: Insufficient documentation

## 2015-11-29 DIAGNOSIS — J309 Allergic rhinitis, unspecified: Secondary | ICD-10-CM

## 2015-11-29 DIAGNOSIS — R2689 Other abnormalities of gait and mobility: Secondary | ICD-10-CM | POA: Diagnosis not present

## 2015-11-29 DIAGNOSIS — M25662 Stiffness of left knee, not elsewhere classified: Secondary | ICD-10-CM | POA: Insufficient documentation

## 2015-11-29 DIAGNOSIS — J4530 Mild persistent asthma, uncomplicated: Secondary | ICD-10-CM

## 2015-11-29 NOTE — Therapy (Signed)
[facility] [address] [place], [place], [address] Phone: [phone]   Fax:  [phone]  Physical Therapy Treatment  Patient Details  Name: Tabitha Oneal MRN: [id number] Date of Birth: 02-09-1956 Referring Provider: August Saucer   Encounter Date: 11/29/2015      PT End of Session - 11/29/15 1159    Visit Number 15   Number of Visits 24   Date for PT Re-Evaluation 12/17/15   PT Start Time 1153   PT Stop Time 1242   PT Time Calculation (min) 49 min      Past Medical History  Diagnosis Date  . Osteoarthritis of left knee 02/23/2012  . Hard of hearing   . Asthma     QVAR daily and Albuterol as needed  . GERD (gastroesophageal reflux disease)     takes Omeprazole daily  . Seasonal allergies     takes Claritin and Singulair daily.Nasal spray as needed  . Hyperlipidemia     takes Atorvastatin daily  . Diabetes mellitus     takes Metformin and Tradjenta daily  . Hypertension     takes Quinapril daily  . Bladder infection     taking Keflex daily   . Shortness of breath dyspnea     with exertion  . Headache     daily.Takes Excedrine daily  . Joint pain   . Joint swelling   . Chronic back pain     reason unknown  . Nocturia   . History of blood transfusion     no abnormal reaction noted    Past Surgical History  Procedure Laterality Date  . Joint replacement      acl   bil knees  . Abdominal hysterectomy    . Total knee arthroplasty  02/23/2012    Procedure: TOTAL KNEE ARTHROPLASTY;  Surgeon: Eulas Post, MD;  Location[facility];  Service: Orthopedics;  Laterality: Left;  . Knee closed reduction  04/09/2012    Procedure: CLOSED MANIPULATION KNEE;  Surgeon: Eulas Post, MD;  Location: [facility];  Service: Orthopedics;  Laterality: Left;  Manipulation Knee with Anesthesia includes Application of Traction   . Injection knee  04/09/2012    Procedure: KNEE INJECTION;  Surgeon: Eulas Post, MD;   Location: [facility];  Service: Orthopedics;  Laterality: Left;  . Colonoscopy    . Knee arthrotomy Left 08/28/2015    Procedure: LEFT KNEE ARTHROFIBROSIS EXCISION; pylectomy;  Surgeon: Cammy Copa, MD;  Location: [facility];  Service: Orthopedics;  Laterality: Left;    There were no vitals filed for this visit.      Subjective Assessment - 11/29/15 1159    Currently in Pain? Yes   Pain Score 7    Pain Location Knee   Pain Orientation Left;Anterior;Lateral   Aggravating Factors  bending, exercising, walking, cleaning the house   Pain Relieving Factors RICE meds                         [facility] Adult PT Treatment/Exercise - 11/29/15 0001    Ambulation/Gait   Stairs Yes   Stairs Assistance 6: Modified independent (Device/Increase time)   Stair Management Technique Two rails;Alternating pattern   Number of Stairs 10   Height of Stairs 4   Pre-Gait Activities weight shifting  in parallell bars without UE staggered stand, then in mini squat, mini squats withou UE 10 x 2, sit-stands with RLE forward x 10 with  UE support.    Gait Comments worked in parallel bars on forward and retro walking, emphasizing hip and knee flexion, smooth gait pattern.  Used shoe cover to reduce friction on L foot with swing phase.    High Level Balance   High Level Balance Activities Backward walking;Side stepping   High Level Balance Comments staggered stand balance 15 second best    Cryotherapy   Number Minutes Cryotherapy 10 Minutes   Cryotherapy Location Knee   Type of Cryotherapy Ice pack                  PT Short Term Goals - 11/09/15 1047    PT SHORT TERM GOAL #1   Title Pt will be I with initial HEP to continue care at home for max outcome   Status Achieved   PT SHORT TERM GOAL #2   Title Pt will be able to demo 25 deg AROM in L knee flex   Status Achieved   PT SHORT TERM GOAL #3   Title Pt will be able to amb x 20 min with 25% less difficulty for  shopping, community mobility   Status Achieved           PT Long Term Goals - 11/09/15 1047    PT LONG TERM GOAL #1   Title Pt will report 75% reduction in L LE pain with functional mobility   Status On-going   PT LONG TERM GOAL #2   Title Pt will be able to flex L knee to 45 deg in sitting for ease of transfers   Status Partially Met   PT LONG TERM GOAL #3   Title Pt will be able to extend her knee seated (LAQ) and resist min pressure (3+/5) for gait, transfers.    Baseline improved but 2-/5.    Status On-going   PT LONG TERM GOAL #4   Title Pt will score 60% or less impaired on FOTO to demo improved function.    Baseline improved 58%    Status On-going               Plan - 11/29/15 1243    Clinical Impression Statement Worked on shifting weight onto LLE for balance, pre gait, gait, and sit-stand exercises. Encourgaed pt to keep RW moving and try not to allow weight bearing through LLE. Able to maintain minsquat and walk forward in parallel bars.    PT Next Visit Plan try gait with RW and shoe cover to reduce friction.  Encourage hip, knee flexion and smooth progressing,  Core, hip and functional strength      Patient will benefit from skilled therapeutic intervention in order to improve the following deficits and impairments:  Abnormal gait, Decreased range of motion, Difficulty walking, Increased fascial restricitons, Obesity, Decreased activity tolerance, Pain, Decreased skin integrity, Decreased balance, Decreased scar mobility, Hypomobility, Impaired flexibility, Improper body mechanics, Impaired sensation, Increased edema, Decreased strength, Decreased mobility  Visit Diagnosis: Stiffness of left knee, not elsewhere classified  Muscle weakness (generalized)  Difficulty in walking, not elsewhere classified  Pain in left knee  Other abnormalities of gait and mobility     Problem List Patient Active Problem List   Diagnosis Date Noted  . Arthrofibrosis of  knee joint 08/28/2015  . Left hip pain 10/08/2014  . Type 2 diabetes mellitus without complication ([place]) 41/66/0630  . Hyperlipidemia LDL goal <100 10/08/2014  . Essential hypertension, benign 08/30/2014  . Diabetes mellitus, type 2 ([place]) 08/30/2014  . Diverticulosis  of colon without hemorrhage 08/30/2014  . Chronic low back pain 08/30/2014  . Chronic pain syndrome 08/30/2014  . Asthma in adult without complication 75/64/3329  . 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 Osteoarthritis of left knee 02/23/2012    Dorene Ar, PTA 11/29/2015, 12:44 PM  [facility] [address] [place], [place], [address] Phone: [phone]   Fax:  [phone]  Name: EMMERSON SHUFFIELD MRN: [id number] Date of Birth: 1956-05-05

## 2015-11-29 NOTE — Patient Instructions (Addendum)
   Prednisone 20 mg daily for 3 days, 10 mg the last day.  For duration of sample use---Dulera 158mcg 2 puffs twice daily, then return to Qvar 83mcg 2 puffs twice daily.  Saline nasal wash 2 sprays twice daily.  Continue Singulair and Zyrtec daily.  Astelin one spray twice daily.  ProAir HFA 2 puffs every 4 hours as needed for cough or wheeze.  Follow-up in 3-4 months or sooner if needed.  Call office with any persisting symptoms or lack of improvement.

## 2015-11-29 NOTE — Progress Notes (Signed)
FOLLOW UP NOTE  RE: Tabitha Oneal MRN: [id number] DOB: [birth year] [facility] [address] [place] [address] Date of Office Visit: 11/29/2015  Subjective:  Tabitha Oneal is a 60 y.o. female who presents today for Asthma  Assessment:   1. Mild persistent asthma, intermittent symptoms in no respiratory distress, afebrile with normal oxygenation and clear lung exam.   2. Allergic rhinoconjunctivitis.  3.      Complex medical history, including hypertension on ACE inhibitor. 4.      History of knee replacement with sedentary lifestyle. Plan:  1.   Prednisone 20 mg daily for 3 days, 10 mg the last day. 2.   For duration of sample use---Dulera 152mcg 2 puffs twice daily, then return to Qvar 59mcg 2 puffs twice daily. 3.   Saline nasal wash 2 sprays twice daily. 4.   Continue Singulair Daily and use Zyrtec once daily in place of loratadine. 5.   Astelin one spray twice daily, after saline nasal wash 6.   ProAir HFA 2 puffs every 4 hours as needed for cough or wheeze. 7.   Follow-up in 3 months or sooner if needed. 8.   Call office with any persisting symptoms or lack of improvement, reviewed with Tabitha Oneal at length the importance of communicating with the office early if any persisting symptoms, as well as our 24 hour on call physician availability.  HPI: Tabitha Oneal, vague historian, returns to the office with report of cough, intermittently.  She reports feeling well after her May visit here, however only maintaining on Qvar once a day, typically.  Reporting now symptoms in the last 4 weeks, but not daily, which have included congestion, wheezing, rhinorrhea, sneezing, postnasal drip and intermittent phlegm clear production.  She denies chest tightness, difficulty breathing, shortness of breath, fever, discolored drainage or sore throat.  She is not aware of any specific sick contacts, but started using albuterol twice a day in the recent  days.  She reports attending physical therapy without difficulty and finds nasal sprays and "allergy" tablets beneficial.  No other new medical concerns or difficulties.  She notes occasionally, evening wheezing but no nocturnal awakenings or disruptive sleep. She has not sought other medical care, since her symptoms have been prominent.  Denies ED or urgent care visits, prednisone or antibiotic courses.   Tabitha Oneal has a current medication list which includes the following prescription(s): albuterol, amitiza, aspirin-acetaminophen-caffeine, atorvastatin, azelastine, baclofen, beclomethasone, [facility]vs loratadine, diclofenac sodium, linagliptin, metformin, montelukast, omeprazole, one touch ultra test, oxycodone hcl, quinapril,.   Drug Allergies: No Known Allergies  Objective:   Filed Vitals:   11/29/15 1020  BP: 136/76  Pulse: 70  Temp: 98.5 F (36.9 C)  Resp: 16   SpO2 Readings from Last 1 Encounters:  11/29/15 98%   Physical Exam  Constitutional: She is well-developed, well-nourished, and in no distress.  HENT:  Head: Atraumatic.  Right Ear: Tympanic membrane and ear canal normal.  Left Ear: Tympanic membrane and ear canal normal.  Nose: Mucosal edema present. No rhinorrhea. No epistaxis.  Mouth/Throat: Oropharynx is clear and moist and mucous membranes are normal. No oropharyngeal exudate, posterior oropharyngeal edema or posterior oropharyngeal erythema.  Neck: Neck supple.  Cardiovascular: Normal rate, S1 normal and S2 normal.   No murmur heard. Pulmonary/Chest: Effort normal. She has no wheezes. She has no rhonchi. She has no rales.  Post Xopenex/Atrovent neb: Continues to be clear.  Patient without wheeze, rhonchi or crackles.  Lymphadenopathy:  She has no cervical adenopathy.   Diagnostics: Spirometry:  FVC  2.32--88%, FEV1 1.88--89%; post bronchodilator FVC  2.45--93%, FEV1 1.97--93%. ACT=15.    Tabitha Oneal M. Ishmael Holter, MD  cc: Gildardo Cranker, DO

## 2015-12-03 ENCOUNTER — Ambulatory Visit: Payer: Medicare Other | Admitting: Physical Therapy

## 2015-12-03 DIAGNOSIS — M25562 Pain in left knee: Secondary | ICD-10-CM | POA: Diagnosis not present

## 2015-12-03 DIAGNOSIS — R262 Difficulty in walking, not elsewhere classified: Secondary | ICD-10-CM | POA: Diagnosis not present

## 2015-12-03 DIAGNOSIS — R2689 Other abnormalities of gait and mobility: Secondary | ICD-10-CM | POA: Diagnosis not present

## 2015-12-03 DIAGNOSIS — M79605 Pain in left leg: Secondary | ICD-10-CM | POA: Diagnosis not present

## 2015-12-03 DIAGNOSIS — M6281 Muscle weakness (generalized): Secondary | ICD-10-CM | POA: Diagnosis not present

## 2015-12-03 DIAGNOSIS — M25662 Stiffness of left knee, not elsewhere classified: Secondary | ICD-10-CM

## 2015-12-03 NOTE — Therapy (Signed)
Tabitha Oneal, [place], [address] Phone: [phone]   Fax:  [phone]  Physical Therapy Treatment  Patient Details  Name: Tabitha Oneal MRN: [id number] Date of Birth: [birth year] Referring Provider: Marlou Sa   Encounter Date: 12/03/2015      PT End of Session - 12/03/15 1107    Visit Number 16   Number of Visits 24   Date for PT Re-Evaluation 12/17/15   PT Start Time 6333   PT Stop Time 1108   PT Time Calculation (min) 53 min      Past Medical History  Diagnosis Date  . Osteoarthritis of left knee 02/23/2012  . Hard of hearing   . Asthma     QVAR daily and Albuterol as needed  . GERD (gastroesophageal reflux disease)     takes Omeprazole daily  . Seasonal allergies     takes Claritin and Singulair daily.Nasal spray as needed  . Hyperlipidemia     takes Atorvastatin daily  . Diabetes mellitus     takes Metformin and Tradjenta daily  . Hypertension     takes Quinapril daily  . Bladder infection     taking Keflex daily   . Shortness of breath dyspnea     with exertion  . Headache     daily.Takes Excedrine daily  . Joint pain   . Joint swelling   . Chronic back pain     reason unknown  . Nocturia   . History of blood transfusion     no abnormal reaction noted    Past Surgical History  Procedure Laterality Date  . Joint replacement      acl   bil knees  . Abdominal hysterectomy    . Total knee arthroplasty  02/23/2012    Procedure: TOTAL KNEE ARTHROPLASTY;  Surgeon: Johnny Bridge, MD;  Location: [place];  Service: Orthopedics;  Laterality: Left;  . Knee closed reduction  04/09/2012    Procedure: CLOSED MANIPULATION KNEE;  Surgeon: Johnny Bridge, MD;  Location: [place];  Service: Orthopedics;  Laterality: Left;  Manipulation Knee with Anesthesia includes Application of Traction   . Injection knee  04/09/2012    Procedure: KNEE INJECTION;  Surgeon: Johnny Bridge,  MD;  Location: [facility];  Service: Orthopedics;  Laterality: Left;  . Colonoscopy    . Knee arthrotomy Left 08/28/2015    Procedure: LEFT KNEE ARTHROFIBROSIS EXCISION; pylectomy;  Surgeon: Meredith Pel, MD;  Location: [place];  Service: Orthopedics;  Laterality: Left;    There were no vitals filed for this visit.      Subjective Assessment - 12/03/15 1057    Subjective The JAS representative should meet me here today    Currently in Pain? Yes   Pain Score 7    Pain Location Knee   Pain Orientation Right;Anterior;Lateral   Pain Descriptors / Indicators Aching            [facility] PT Assessment - 12/03/15 0001    AROM   Left Knee Extension 15  supine   Left Knee Flexion 45  with strap                     [facility] Adult PT Treatment/Exercise - 12/03/15 0001    Ambulation/Gait   Pre-Gait Activities weight shifting in stagger stance then advancing and holding each step,    Gait Comments worked in parallel bars on forward and retro walking, emphasizing  hip and knee flexion, smooth gait pattern.   High Level Balance   High Level Balance Activities Backward walking;Side stepping   High Level Balance Comments staggered stand balance 15 second best , narrow balance eyes closed- multiple LOB, narrow balance with trunk rotations    Knee/Hip Exercises: Aerobic   Nustep UE and LE L5 for AAROM, strength, 10 minutes   Knee/Hip Exercises: Supine   Straight Leg Raises 20 reps   Knee/Hip Exercises: Sidelying   Hip ABduction Left;20 reps   Cryotherapy   Number Minutes Cryotherapy 10 Minutes   Cryotherapy Location Knee   Type of Cryotherapy Ice pack                  PT Short Term Goals - 11/09/15 1047    PT SHORT TERM GOAL #1   Title Pt will be I with initial HEP to continue care at home for max outcome   Status Achieved   PT SHORT TERM GOAL #2   Title Pt will be able to demo 25 deg AROM in L knee flex   Status Achieved   PT SHORT TERM GOAL #3    Title Pt will be able to amb x 20 min with 25% less difficulty for shopping, community mobility   Status Achieved           PT Long Term Goals - 11/09/15 1047    PT LONG TERM GOAL #1   Title Pt will report 75% reduction in L LE pain with functional mobility   Status On-going   PT LONG TERM GOAL #2   Title Pt will be able to flex L knee to 45 deg in sitting for ease of transfers   Status Partially Met   PT LONG TERM GOAL #3   Title Pt will be able to extend her knee seated (LAQ) and resist min pressure (3+/5) for gait, transfers.    Baseline improved but 2-/5.    Status On-going   PT LONG TERM GOAL #4   Title Pt will score 60% or less impaired on FOTO to demo improved function.    Baseline improved 58%    Status On-going               Plan - 12/03/15 1342    Clinical Impression Statement Continued to work on gait, balance with pt demonstrating multiple LOB with moderate level balance activites. JAS representative met pt today after treatment and she was issued JAS for home.    PT Next Visit Plan try gait with RW and shoe cover to reduce friction.  Encourage hip, knee flexion and smooth progressing,  Core, hip and functional strength, hows the JAS?      Patient will benefit from skilled therapeutic intervention in order to improve the following deficits and impairments:  Abnormal gait, Decreased range of motion, Difficulty walking, Increased fascial restricitons, Obesity, Decreased activity tolerance, Pain, Decreased skin integrity, Decreased balance, Decreased scar mobility, Hypomobility, Impaired flexibility, Improper body mechanics, Impaired sensation, Increased edema, Decreased strength, Decreased mobility  Visit Diagnosis: Stiffness of left knee, not elsewhere classified  Muscle weakness (generalized)  Difficulty in walking, not elsewhere classified  Pain in left knee     Problem List Patient Active Problem List   Diagnosis Date Noted  . Arthrofibrosis of  knee joint 08/28/2015  . Left hip pain 10/08/2014  . Type 2 diabetes mellitus without complication ([place]) 16/02/9603  . Hyperlipidemia LDL goal <100 10/08/2014  . Essential hypertension, benign 08/30/2014  . Diabetes  mellitus, type 2 ([place]) 08/30/2014  . Diverticulosis of colon without hemorrhage 08/30/2014  . Chronic low back pain 08/30/2014  . Chronic pain syndrome 08/30/2014  . Asthma in adult without complication 61/84/8592  . 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 Osteoarthritis of left knee 02/23/2012    Dorene Ar, PTA 12/03/2015, 1:58 PM  [place], [place], [address] Phone: [phone]   Fax:  [phone]  Name: LESLEYANN FICHTER MRN: [id number] Date of Birth: 1955/07/31

## 2015-12-04 ENCOUNTER — Ambulatory Visit
Admission: RE | Admit: 2015-12-04 | Discharge: 2015-12-04 | Disposition: A | Discharge status: Home / Self Care | Payer: Medicare Other | Source: Ambulatory Visit | Attending: Internal Medicine | Admitting: Internal Medicine

## 2015-12-04 DIAGNOSIS — Z139 Encounter for screening, unspecified: Secondary | ICD-10-CM

## 2015-12-04 DIAGNOSIS — Z1231 Encounter for screening mammogram for malignant neoplasm of breast: Secondary | ICD-10-CM | POA: Diagnosis not present

## 2015-12-06 ENCOUNTER — Ambulatory Visit: Payer: Medicare Other | Admitting: Physical Therapy

## 2015-12-06 DIAGNOSIS — M25562 Pain in left knee: Secondary | ICD-10-CM

## 2015-12-06 DIAGNOSIS — R2689 Other abnormalities of gait and mobility: Secondary | ICD-10-CM

## 2015-12-06 DIAGNOSIS — R262 Difficulty in walking, not elsewhere classified: Secondary | ICD-10-CM | POA: Diagnosis not present

## 2015-12-06 DIAGNOSIS — M79605 Pain in left leg: Secondary | ICD-10-CM | POA: Diagnosis not present

## 2015-12-06 DIAGNOSIS — M6281 Muscle weakness (generalized): Secondary | ICD-10-CM | POA: Diagnosis not present

## 2015-12-06 DIAGNOSIS — M25662 Stiffness of left knee, not elsewhere classified: Secondary | ICD-10-CM | POA: Diagnosis not present

## 2015-12-06 NOTE — Therapy (Signed)
[place], [place], [address] Phone: [phone]   Fax:  [phone]  Physical Therapy Treatment  Patient Details  Name: Tabitha Oneal MRN: [id number] Date of Birth: 01/11/1956 Referring Provider: Marlou Sa   Encounter Date: 12/06/2015      PT End of Session - 12/06/15 1202    Visit Number 17   Number of Visits 24   Date for PT Re-Evaluation 12/17/15   PT Start Time 1017   PT Stop Time 1110   PT Time Calculation (min) 53 min   Activity Tolerance Patient tolerated treatment well   Behavior During Therapy [facility] for tasks assessed/performed      Past Medical History  Diagnosis Date  . Osteoarthritis of left knee 02/23/2012  . Hard of hearing   . Asthma     QVAR daily and Albuterol as needed  . GERD (gastroesophageal reflux disease)     takes Omeprazole daily  . Seasonal allergies     takes Claritin and Singulair daily.Nasal spray as needed  . Hyperlipidemia     takes Atorvastatin daily  . Diabetes mellitus     takes Metformin and Tradjenta daily  . Hypertension     takes Quinapril daily  . Bladder infection     taking Keflex daily   . Shortness of breath dyspnea     with exertion  . Headache     daily.Takes Excedrine daily  . Joint pain   . Joint swelling   . Chronic back pain     reason unknown  . Nocturia   . History of blood transfusion     no abnormal reaction noted    Past Surgical History  Procedure Laterality Date  . Joint replacement      acl   bil knees  . Abdominal hysterectomy    . Total knee arthroplasty  02/23/2012    Procedure: TOTAL KNEE ARTHROPLASTY;  Surgeon: Johnny Bridge, MD;  Location: [place];  Service: Orthopedics;  Laterality: Left;  . Knee closed reduction  04/09/2012    Procedure: CLOSED MANIPULATION KNEE;  Surgeon: Johnny Bridge, MD;  Location: [facility];  Service: Orthopedics;  Laterality: Left;  Manipulation Knee with Anesthesia includes  Application of Traction   . Injection knee  04/09/2012    Procedure: KNEE INJECTION;  Surgeon: Johnny Bridge, MD;  Location: [facility];  Service: Orthopedics;  Laterality: Left;  . Colonoscopy    . Knee arthrotomy Left 08/28/2015    Procedure: LEFT KNEE ARTHROFIBROSIS EXCISION; pylectomy;  Surgeon: Meredith Pel, MD;  Location: [facility];  Service: Orthopedics;  Laterality: Left;    There were no vitals filed for this visit.      Subjective Assessment - 12/06/15 1022    Subjective Used the brace 3 times yesterday, 3 hours total.  It hurts and its hard to get up afterwards BUT today she may have less pain.  Has a headache.    Currently in Pain? Yes   Pain Score 5             [facility] PT Assessment - 12/06/15 1059    AROM   Left Knee Flexion 50  PROM seated             [facility] Adult PT Treatment/Exercise - 12/06/15 1028    Knee/Hip Exercises: Stretches   Other Knee/Hip Stretches slant board 30 x 3    Knee/Hip Exercises: Aerobic   Nustep LE only L  5 for 5 min cues to push thru heel    Knee/Hip Exercises: Standing   Hip Flexion Stengthening;Both;1 set;20 reps;Knee bent   Hip Flexion Limitations added hip ext for larger ROM    Forward Lunges Both;1 set;20 reps   Forward Step Up Right;Left;1 set;Hand Hold: 2;Hand Hold: 1;Hand Hold: 0;Step Height: 6";Step Height: 8";Other (comment)   Forward Step Up Limitations worked on LLE as supporting leg and coordination.  Able to finally do 8 inch step without UE support    Knee/Hip Exercises: Supine   Heel Slides AAROM;Strengthening;Left;2 sets;10 reps   Heel Slides Limitations on ball single and double leg    Bridges Limitations x 10 legs on ball    Cryotherapy   Number Minutes Cryotherapy 10 Minutes   Cryotherapy Location Knee   Type of Cryotherapy Ice pack   Manual Therapy   Joint Mobilization Gr 2-3 flexion and extension  seated    Passive ROM knee flexion             PT Short Term Goals - 11/09/15 1047     PT SHORT TERM GOAL #1   Title Pt will be I with initial HEP to continue care at home for max outcome   Status Achieved   PT SHORT TERM GOAL #2   Title Pt will be able to demo 25 deg AROM in L knee flex   Status Achieved   PT SHORT TERM GOAL #3   Title Pt will be able to amb x 20 min with 25% less difficulty for shopping, community mobility   Status Achieved           PT Long Term Goals - 11/09/15 1047    PT LONG TERM GOAL #1   Title Pt will report 75% reduction in L LE pain with functional mobility   Status On-going   PT LONG TERM GOAL #2   Title Pt will be able to flex L knee to 45 deg in sitting for ease of transfers   Status Partially Met   PT LONG TERM GOAL #3   Title Pt will be able to extend her knee seated (LAQ) and resist min pressure (3+/5) for gait, transfers.    Baseline improved but 2-/5.    Status On-going   PT LONG TERM GOAL #4   Title Pt will score 60% or less impaired on FOTO to demo improved function.    Baseline improved 58%    Status On-going               Plan - 12/06/15 1205    Clinical Impression Statement Pt able to use LLE for support with step ups.  PROM in L knee to 50 deg in sitting.  She was able to use her JAS and will cont to work towards goals.    PT Next Visit Plan  Encourage hip, knee flexion and smooth progressing,  Core, hip and functional strength, hows the JAS? Begin to discuss DC vs Renew   PT Home Exercise Plan AAROM for knee flexion, has had PT prior, no handouts given on eval, added hip ext, abd and hip flexor stretch    Consulted and Agree with Plan of Care Patient      Patient will benefit from skilled therapeutic intervention in order to improve the following deficits and impairments:  Abnormal gait, Decreased range of motion, Difficulty walking, Increased fascial restricitons, Obesity, Decreased activity tolerance, Pain, Decreased skin integrity, Decreased balance, Decreased scar mobility, Hypomobility, Impaired  flexibility, Improper  body mechanics, Impaired sensation, Increased edema, Decreased strength, Decreased mobility  Visit Diagnosis: Stiffness of left knee, not elsewhere classified  Muscle weakness (generalized)  Difficulty in walking, not elsewhere classified  Pain in left knee  Other abnormalities of gait and mobility     Problem List Patient Active Problem List   Diagnosis Date Noted  . Arthrofibrosis of knee joint 08/28/2015  . Left hip pain 10/08/2014  . Type 2 diabetes mellitus without complication ([place]) 67/61/9509  . Hyperlipidemia LDL goal <100 10/08/2014  . Essential hypertension, benign 08/30/2014  . Diabetes mellitus, type 2 ([place]) 08/30/2014  . Diverticulosis of colon without hemorrhage 08/30/2014  . Chronic low back pain 08/30/2014  . Chronic pain syndrome 08/30/2014  . Asthma in adult without complication 32/67/1245  . 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 Osteoarthritis of left knee 02/23/2012    PAA,JENNIFER 12/06/2015, 12:09 PM  [facility] [address] [place], [place], [address] Phone: [phone]   Fax:  [phone]  Name: Tabitha Oneal MRN: [id number] Date of Birth: [birth year]    Raeford Razor, PT 12/06/2015 12:09 PM Phone: [phone] Fax: [phone]

## 2015-12-09 ENCOUNTER — Other Ambulatory Visit: Payer: Self-pay | Admitting: Internal Medicine

## 2015-12-10 ENCOUNTER — Ambulatory Visit: Payer: Medicare Other | Admitting: Physical Therapy

## 2015-12-10 DIAGNOSIS — M25662 Stiffness of left knee, not elsewhere classified: Secondary | ICD-10-CM

## 2015-12-10 DIAGNOSIS — R2689 Other abnormalities of gait and mobility: Secondary | ICD-10-CM | POA: Diagnosis not present

## 2015-12-10 DIAGNOSIS — M25562 Pain in left knee: Secondary | ICD-10-CM

## 2015-12-10 DIAGNOSIS — R262 Difficulty in walking, not elsewhere classified: Secondary | ICD-10-CM

## 2015-12-10 DIAGNOSIS — M79605 Pain in left leg: Secondary | ICD-10-CM | POA: Diagnosis not present

## 2015-12-10 DIAGNOSIS — M6281 Muscle weakness (generalized): Secondary | ICD-10-CM

## 2015-12-10 NOTE — Therapy (Signed)
[place], [place], [address] Phone: [phone]   Fax:  [phone]  Physical Therapy Treatment  Patient Details  Name: Tabitha Oneal MRN: [id number] Date of Birth: [birth year] Referring Provider: Marlou Sa   Encounter Date: 12/10/2015      PT End of Session - 12/10/15 1025    Visit Number 18   Number of Visits 24   Date for PT Re-Evaluation 12/17/15   PT Start Time 1020  pt was not fully checked in when she arr   PT Stop Time 1105   PT Time Calculation (min) 45 min   Activity Tolerance Patient tolerated treatment well   Behavior During Therapy [facility] for tasks assessed/performed      Past Medical History  Diagnosis Date  . Osteoarthritis of left knee 02/23/2012  . Hard of hearing   . Asthma     QVAR daily and Albuterol as needed  . GERD (gastroesophageal reflux disease)     takes Omeprazole daily  . Seasonal allergies     takes Claritin and Singulair daily.Nasal spray as needed  . Hyperlipidemia     takes Atorvastatin daily  . Diabetes mellitus     takes Metformin and Tradjenta daily  . Hypertension     takes Quinapril daily  . Bladder infection     taking Keflex daily   . Shortness of breath dyspnea     with exertion  . Headache     daily.Takes Excedrine daily  . Joint pain   . Joint swelling   . Chronic back pain     reason unknown  . Nocturia   . History of blood transfusion     no abnormal reaction noted    Past Surgical History  Procedure Laterality Date  . Joint replacement      acl   bil knees  . Abdominal hysterectomy    . Total knee arthroplasty  02/23/2012    Procedure: TOTAL KNEE ARTHROPLASTY;  Surgeon: Johnny Bridge, MD;  Location: [place];  Service: Orthopedics;  Laterality: Left;  . Knee closed reduction  04/09/2012    Procedure: CLOSED MANIPULATION KNEE;  Surgeon: Johnny Bridge, MD;  Location: [place];  Service: Orthopedics;  Laterality: Left;   Manipulation Knee with Anesthesia includes Application of Traction   . Injection knee  04/09/2012    Procedure: KNEE INJECTION;  Surgeon: Johnny Bridge, MD;  Location: [place];  Service: Orthopedics;  Laterality: Left;  . Colonoscopy    . Knee arthrotomy Left 08/28/2015    Procedure: LEFT KNEE ARTHROFIBROSIS EXCISION; pylectomy;  Surgeon: Meredith Pel, MD;  Location: [place];  Service: Orthopedics;  Laterality: Left;    There were no vitals filed for this visit.      Subjective Assessment - 12/10/15 1025    Subjective Doing good, took meds.  Pain is never below a 5/10 with activity.  Maybe gets to a 4/10 at rest.  Worked with brace all weekend.    Currently in Pain? Yes   Pain Score 5             [facility] PT Assessment - 12/10/15 1043    AROM   Left Knee Extension 24   Left Knee Flexion 40          [facility] Adult PT Treatment/Exercise - 12/10/15 1039    Ambulation/Gait   Ambulation/Gait Assistance 5: Supervision   Assistive device Small based quad cane   Gait  Comments worked on equal weight bearing with upper body movements, reaching to facilitate trunk and hip ext.  Needed mod A at times.     Knee/Hip Exercises: Stretches   Active Hamstring Stretch 3 reps   Knee: Self-Stretch to increase Flexion Left;3 reps;30 seconds   Knee/Hip Exercises: Aerobic   Nustep LE only L 5 for 5 min cues to push thru heel   R  leg fatigued   Knee/Hip Exercises: Standing   Forward Lunges Both;1 set;5 reps;Other (comment)   Forward Lunges Limitations 60 sec hold each LE    Side Lunges Both;1 set;15 reps;5 seconds   Forward Step Up Left;1 set;5 reps   Knee/Hip Exercises: Seated   Long Arc Quad Left;1 set;10 reps   Manual Therapy   Joint Mobilization Gr 2-3 flexion and extension  seated with belt and supine with belt (self mob)                 PT Education - 12/10/15 1249    Education provided Yes   Education Details POC, standing ex, stretching, group class  post PT    Person(s) Educated Patient   Methods Explanation;Demonstration   Comprehension Verbalized understanding;Returned demonstration;Verbal cues required;Need further instruction          PT Short Term Goals - 12/10/15 1044    PT SHORT TERM GOAL #1   Title Pt will be I with initial HEP to continue care at home for max outcome   Status Achieved   PT SHORT TERM GOAL #2   Title Pt will be able to demo 25 deg AROM in L knee flex   Status Achieved   PT SHORT TERM GOAL #3   Title Pt will be able to amb x 20 min with 25% less difficulty for shopping, community mobility   Status Achieved           PT Long Term Goals - 12/10/15 1046    PT LONG TERM GOAL #1   Title Pt will report 75% reduction in L LE pain with functional mobility   Status On-going   PT LONG TERM GOAL #2   Title Pt will be able to flex L knee to 45 deg in sitting for ease of transfers   Status Partially Met   PT LONG TERM GOAL #3   Title Pt will be able to extend her knee seated (LAQ) and resist min pressure (3+/5) for gait, transfers.    Status On-going   PT LONG TERM GOAL #4   Title Pt will score 60% or less impaired on FOTO to demo improved function.    Status On-going               Plan - 12/10/15 1251    Clinical Impression Statement Pt agreeable to DC at end of POC.  Her ROM gains are not consistent.  She uses her brace as directed.  She may consider post PT group for continued supervised exercise.     PT Next Visit Plan  Encourage hip, knee flexion and smooth progressing,  Core, hip and functional strength, hows the JAS? Begin to discuss DC vs Renew   PT Home Exercise Plan AAROM for knee flexion, has had PT prior, no handouts given on eval, added hip ext, abd and hip flexor stretch    Consulted and Agree with Plan of Care Patient      Patient will benefit from skilled therapeutic intervention in order to improve the following deficits and impairments:  Abnormal gait,  Decreased range of  motion, Difficulty walking, Increased fascial restricitons, Obesity, Decreased activity tolerance, Pain, Decreased skin integrity, Decreased balance, Decreased scar mobility, Hypomobility, Impaired flexibility, Improper body mechanics, Impaired sensation, Increased edema, Decreased strength, Decreased mobility  Visit Diagnosis: Stiffness of left knee, not elsewhere classified  Muscle weakness (generalized)  Difficulty in walking, not elsewhere classified  Pain in left knee  Other abnormalities of gait and mobility     Problem List Patient Active Problem List   Diagnosis Date Noted  . Arthrofibrosis of knee joint 08/28/2015  . Left hip pain 10/08/2014  . Type 2 diabetes mellitus without complication ([place]) 58/85/0277  . Hyperlipidemia LDL goal <100 10/08/2014  . Essential hypertension, benign 08/30/2014  . Diabetes mellitus, type 2 ([place]) 08/30/2014  . Diverticulosis of colon without hemorrhage 08/30/2014  . Chronic low back pain 08/30/2014  . Chronic pain syndrome 08/30/2014  . Asthma in adult without complication 41/28/7867  . 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 Osteoarthritis of left knee 02/23/2012    Cartier Washko 12/10/2015, 1:07 PM  [facility] [address] [place], [place], [address] Phone: [phone]   Fax:  [phone]  Name: Tabitha Oneal MRN: [id number] Date of Birth: 1956/02/11    Raeford Razor, PT 12/10/2015 1:07 PM Phone: [phone] Fax: [phone]

## 2015-12-10 NOTE — Patient Instructions (Signed)
Warrior I    In wide stride, rotate back leg out 20, grounding foot, HOLD FOR BALANCE .  Bend front leg 90.  Hold for __30-60 sec __ breaths. Repeat, other leg forward. BEGINNER: Support body with hands on hips.   Copyright  VHI. All rights reserved.    Body-Weight Side Lunge: Stable - Stationary (Active)    Stand with head up, back flat. Step forward and to the side, bending leg until thigh is parallel to floor. Complete __1_ sets of __5-10_ repetitions. Perform __1_ sessions per day. HOLD ON FOR BALANCE   Copyright  VHI. All rights reserved.

## 2015-12-11 DIAGNOSIS — M25562 Pain in left knee: Secondary | ICD-10-CM | POA: Diagnosis not present

## 2015-12-11 DIAGNOSIS — G894 Chronic pain syndrome: Secondary | ICD-10-CM | POA: Diagnosis not present

## 2015-12-11 DIAGNOSIS — M545 Low back pain: Secondary | ICD-10-CM | POA: Diagnosis not present

## 2015-12-11 DIAGNOSIS — Z79891 Long term (current) use of opiate analgesic: Secondary | ICD-10-CM | POA: Diagnosis not present

## 2015-12-12 ENCOUNTER — Ambulatory Visit: Payer: Medicare Other | Admitting: Physical Therapy

## 2015-12-12 DIAGNOSIS — M25662 Stiffness of left knee, not elsewhere classified: Secondary | ICD-10-CM

## 2015-12-12 DIAGNOSIS — M25562 Pain in left knee: Secondary | ICD-10-CM | POA: Diagnosis not present

## 2015-12-12 DIAGNOSIS — M79605 Pain in left leg: Secondary | ICD-10-CM | POA: Diagnosis not present

## 2015-12-12 DIAGNOSIS — R262 Difficulty in walking, not elsewhere classified: Secondary | ICD-10-CM | POA: Diagnosis not present

## 2015-12-12 DIAGNOSIS — R2689 Other abnormalities of gait and mobility: Secondary | ICD-10-CM | POA: Diagnosis not present

## 2015-12-12 DIAGNOSIS — M6281 Muscle weakness (generalized): Secondary | ICD-10-CM | POA: Diagnosis not present

## 2015-12-12 NOTE — Patient Instructions (Signed)
Hip Flexor Stretch    Lying on back near edge of bed, bend one leg, foot flat. Hang other leg over edge, relaxed, thigh resting entirely on bed for _1___ minutes. Repeat __2__ times. Do _2___ sessions per day. USE A BELT TO PULL HEEL TO BUTTOCK

## 2015-12-12 NOTE — Therapy (Signed)
[place], [place], [address] Phone: [phone]   Fax:  [phone]  Physical Therapy Treatment  Patient Details  Name: Tabitha Oneal MRN: [id number] Date of Birth: May 15, 1956 Referring Provider: Marlou Sa   Encounter Date: 12/12/2015      PT End of Session - 12/12/15 1028    Visit Number 19   Number of Visits 24   Date for PT Re-Evaluation 12/17/15   PT Start Time 1020   PT Stop Time 1115   PT Time Calculation (min) 55 min      Past Medical History  Diagnosis Date  . Osteoarthritis of left knee 02/23/2012  . Hard of hearing   . Asthma     QVAR daily and Albuterol as needed  . GERD (gastroesophageal reflux disease)     takes Omeprazole daily  . Seasonal allergies     takes Claritin and Singulair daily.Nasal spray as needed  . Hyperlipidemia     takes Atorvastatin daily  . Diabetes mellitus     takes Metformin and Tradjenta daily  . Hypertension     takes Quinapril daily  . Bladder infection     taking Keflex daily   . Shortness of breath dyspnea     with exertion  . Headache     daily.Takes Excedrine daily  . Joint pain   . Joint swelling   . Chronic back pain     reason unknown  . Nocturia   . History of blood transfusion     no abnormal reaction noted    Past Surgical History  Procedure Laterality Date  . Joint replacement      acl   bil knees  . Abdominal hysterectomy    . Total knee arthroplasty  02/23/2012    Procedure: TOTAL KNEE ARTHROPLASTY;  Surgeon: Johnny Bridge, MD;  Location: [facility];  Service: Orthopedics;  Laterality: Left;  . Knee closed reduction  04/09/2012    Procedure: CLOSED MANIPULATION KNEE;  Surgeon: Johnny Bridge, MD;  Location: [facility];  Service: Orthopedics;  Laterality: Left;  Manipulation Knee with Anesthesia includes Application of Traction   . Injection knee  04/09/2012    Procedure: KNEE INJECTION;  Surgeon: Johnny Bridge,  MD;  Location: [facility];  Service: Orthopedics;  Laterality: Left;  . Colonoscopy    . Knee arthrotomy Left 08/28/2015    Procedure: LEFT KNEE ARTHROFIBROSIS EXCISION; pylectomy;  Surgeon: Meredith Pel, MD;  Location: [facility];  Service: Orthopedics;  Laterality: Left;    There were no vitals filed for this visit.      Subjective Assessment - 12/12/15 1024    Subjective The brace is hard to adjust but I have been working on it.    Currently in Pain? Yes   Pain Score 5    Aggravating Factors  bending, exercises, walking   Pain Relieving Factors RICE, meds            [facility] PT Assessment - 12/12/15 0001    AROM   Left Knee Extension 25  -15 PROM   Left Knee Flexion 40  50 PROM seated                     [facility] Adult PT Treatment/Exercise - 12/12/15 0001    Knee/Hip Exercises: Stretches   Active Hamstring Stretch 3 reps   Active Hamstring Stretch Limitations supine with strap   Hip Flexor Stretch 1 rep;60  seconds   Knee: Self-Stretch to increase Flexion Left;3 reps;30 seconds   Knee: Self-Stretch Limitations using RW for leverage   Knee/Hip Exercises: Standing   Functional Squat Limitations lateral squats alternating behind chair    Knee/Hip Exercises: Seated   Long Arc Quad Left;1 set;10 reps   Knee/Hip Exercises: Supine   Straight Leg Raises 20 reps   Knee/Hip Exercises: Sidelying   Hip ABduction Left;20 reps   Knee/Hip Exercises: Prone   Hamstring Curl 2 sets;10 reps   Hip Extension 2 sets;10 reps   Cryotherapy   Number Minutes Cryotherapy 10 Minutes   Cryotherapy Location Knee   Type of Cryotherapy Ice pack   Manual Therapy   Joint Mobilization Gr 2-3 flexion and extension  seated    Passive ROM knee flexion  and extension                  PT Short Term Goals - 12/10/15 1044    PT SHORT TERM GOAL #1   Title Pt will be I with initial HEP to continue care at home for max outcome   Status Achieved   PT SHORT TERM GOAL  #2   Title Pt will be able to demo 25 deg AROM in L knee flex   Status Achieved   PT SHORT TERM GOAL #3   Title Pt will be able to amb x 20 min with 25% less difficulty for shopping, community mobility   Status Achieved           PT Long Term Goals - 12/12/15 1031    PT LONG TERM GOAL #1   Title Pt will report 75% reduction in L LE pain with functional mobility   Baseline 55% improvement in pain   Time 8   Period Weeks   Status Partially Met   PT LONG TERM GOAL #2   Title Pt will be able to flex L knee to 45 deg in sitting for ease of transfers   Baseline AAROM 40 degrees   Time 8   Period Weeks   Status Partially Met   PT LONG TERM GOAL #3   Title Pt will be able to extend her knee seated (LAQ) and resist min pressure (3+/5) for gait, transfers.    Baseline in available ROM  25-40 degrees   Time 8   Period Weeks   Status Achieved   PT LONG TERM GOAL #4   Title Pt will score 60% or less impaired on FOTO to demo improved function.    Time 8   Period Weeks   Status Unable to assess               Plan - 12/12/15 1135    Clinical Impression Statement Reviewed goals and prepped pt for discharge next visit. She reports 55% decrease in pain with daily function. She is using her JAS as instructed however she is unable to walk afterward due to pain. I asked her to try tightening it a little less and see if that helps wtih the pain.  PROM 15 to 50 in seated position. Knee extension strength in available ROM 4/5. LTG#3 Met.    PT Next Visit Plan Discharge next visit, FOTO , review HEP      Patient will benefit from skilled therapeutic intervention in order to improve the following deficits and impairments:  Abnormal gait, Decreased range of motion, Difficulty walking, Increased fascial restricitons, Obesity, Decreased activity tolerance, Pain, Decreased skin integrity, Decreased balance, Decreased scar mobility, Hypomobility,  Impaired flexibility, Improper body mechanics,  Impaired sensation, Increased edema, Decreased strength, Decreased mobility  Visit Diagnosis: Stiffness of left knee, not elsewhere classified  Muscle weakness (generalized)  Difficulty in walking, not elsewhere classified  Pain in left knee  Other abnormalities of gait and mobility     Problem List Patient Active Problem List   Diagnosis Date Noted  . Arthrofibrosis of knee joint 08/28/2015  . Left hip pain 10/08/2014  . Type 2 diabetes mellitus without complication ([facility]) 19/47/1252  . Hyperlipidemia LDL goal <100 10/08/2014  . Essential hypertension, benign 08/30/2014  . Diabetes mellitus, type 2 ([place]) 08/30/2014  . Diverticulosis of colon without hemorrhage 08/30/2014  . Chronic low back pain 08/30/2014  . Chronic pain syndrome 08/30/2014  . Asthma in adult without complication 71/29/2909  . 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 Osteoarthritis of left knee 02/23/2012    Dorene Ar, PTA 12/12/2015, 11:38 AM  [facility] [address] [place], [place], [address] Phone: [phone]   Fax:  [phone]  Name: Tabitha Oneal MRN: [id number] Date of Birth: Mar 15, 1956

## 2015-12-17 ENCOUNTER — Ambulatory Visit: Payer: Medicare Other | Admitting: Physical Therapy

## 2015-12-17 DIAGNOSIS — R2689 Other abnormalities of gait and mobility: Secondary | ICD-10-CM | POA: Diagnosis not present

## 2015-12-17 DIAGNOSIS — M25662 Stiffness of left knee, not elsewhere classified: Secondary | ICD-10-CM

## 2015-12-17 DIAGNOSIS — M6281 Muscle weakness (generalized): Secondary | ICD-10-CM | POA: Diagnosis not present

## 2015-12-17 DIAGNOSIS — R262 Difficulty in walking, not elsewhere classified: Secondary | ICD-10-CM | POA: Diagnosis not present

## 2015-12-17 DIAGNOSIS — M25562 Pain in left knee: Secondary | ICD-10-CM | POA: Diagnosis not present

## 2015-12-17 DIAGNOSIS — R269 Unspecified abnormalities of gait and mobility: Secondary | ICD-10-CM

## 2015-12-17 DIAGNOSIS — M79605 Pain in left leg: Secondary | ICD-10-CM

## 2015-12-17 NOTE — Therapy (Signed)
[place], [place], [address] Phone: [phone]   Fax:  [phone]  Physical Therapy Treatment and Discharge   Patient Details  Name: Tabitha Oneal MRN: [id number] Date of Birth: 1955-12-16 Referring Provider: Marlou Sa   Encounter Date: 12/17/2015      PT End of Session - 12/17/15 1111    Visit Number 20   Number of Visits 24   Date for PT Re-Evaluation 12/17/15   PT Start Time 1102   PT Stop Time 1200   PT Time Calculation (min) 58 min      Past Medical History:  Diagnosis Date  . Asthma    QVAR daily and Albuterol as needed  . Bladder infection    taking Keflex daily   . Chronic back pain    reason unknown  . Diabetes mellitus    takes Metformin and Tradjenta daily  . GERD (gastroesophageal reflux disease)    takes Omeprazole daily  . Hard of hearing   . Headache    daily.Takes Excedrine daily  . History of blood transfusion    no abnormal reaction noted  . Hyperlipidemia    takes Atorvastatin daily  . Hypertension    takes Quinapril daily  . Joint pain   . Joint swelling   . Nocturia   . Osteoarthritis of left knee 02/23/2012  . Seasonal allergies    takes Claritin and Singulair daily.Nasal spray as needed  . Shortness of breath dyspnea    with exertion    Past Surgical History:  Procedure Laterality Date  . ABDOMINAL HYSTERECTOMY    . COLONOSCOPY    . INJECTION KNEE  04/09/2012   Procedure: KNEE INJECTION;  Surgeon: Johnny Bridge, MD;  Location: [facility];  Service: Orthopedics;  Laterality: Left;  . JOINT REPLACEMENT     acl   bil knees  . KNEE ARTHROTOMY Left 08/28/2015   Procedure: LEFT KNEE ARTHROFIBROSIS EXCISION; pylectomy;  Surgeon: Meredith Pel, MD;  Location: [facility];  Service: Orthopedics;  Laterality: Left;  . KNEE CLOSED REDUCTION  04/09/2012   Procedure: CLOSED MANIPULATION KNEE;  Surgeon: Johnny Bridge, MD;  Location: [facility];  Service: Orthopedics;  Laterality: Left;  Manipulation Knee with Anesthesia includes Application of Traction   . TOTAL KNEE ARTHROPLASTY  02/23/2012   Procedure: TOTAL KNEE ARTHROPLASTY;  Surgeon: Johnny Bridge, MD;  Location: [facility];  Service: Orthopedics;  Laterality: Left;    There were no vitals filed for this visit.      Subjective Assessment - 12/17/15 1108    Subjective It so bad last night. My back, shoulders and left leg hurt all night long. I hurt every night.    Pain Score 7    Pain Location Knee   Pain Orientation Right;Anterior;Lateral                          PT Short Term Goals - 12/10/15 1044      PT SHORT TERM GOAL #1   Title Pt will be I with initial HEP to continue care at home for max outcome   Status Achieved     PT SHORT TERM GOAL #2   Title Pt will be able to demo 25 deg AROM in L knee flex   Status Achieved     PT SHORT TERM GOAL #3   Title Pt will be able to amb x 20 min  with 25% less difficulty for shopping, community mobility   Status Achieved           PT Long Term Goals - 12/12/15 1031      PT LONG TERM GOAL #1   Title Pt will report 75% reduction in L LE pain with functional mobility   Baseline 55% improvement in pain   Time 8   Period Weeks   Status Partially Met     PT LONG TERM GOAL #2   Title Pt will be able to flex L knee to 45 deg in sitting for ease of transfers   Baseline AAROM 40 degrees   Time 8   Period Weeks   Status Partially Met     PT LONG TERM GOAL #3   Title Pt will be able to extend her knee seated (LAQ) and resist min pressure (3+/5) for gait, transfers.    Baseline in available ROM  25-40 degrees   Time 8   Period Weeks   Status Achieved     PT LONG TERM GOAL #4   Title Pt will score 60% or less impaired on FOTO to demo improved function.    Time 8   Period Weeks   Status Unable to assess               Plan - 12/17/15 1402    Clinical Impression Statement Pt  reports overall 55% decrease in knee pain however today she is in 7/10 constant pain and also c/o all over body pain that keeps her awake every night. We reviewed her HEP and she is independent. She is using her JAS at home. Her knee ROM remains unchanged. Continued encouragement to use RW due to unsteadiness. Pt with CGA using quad cane today. Pt may join our group exercise class.    PT Next Visit Plan Dich[place]      Patient will benefit from skilled therapeutic intervention in order to improve the following deficits and impairments:  Abnormal gait, Decreased range of motion, Difficulty walking, Increased fascial restricitons, Obesity, Decreased activity tolerance, Pain, Decreased skin integrity, Decreased balance, Decreased scar mobility, Hypomobility, Impaired flexibility, Improper body mechanics, Impaired sensation, Increased edema, Decreased strength, Decreased mobility  Visit Diagnosis: Stiffness of left knee, not elsewhere classified  Muscle weakness (generalized)  Difficulty in walking, not elsewhere classified  Pain in left knee  Other abnormalities of gait and mobility  Pain of left lower extremity  Abnormality of gait       G-Codes - 12/19/2015 1347    Functional Assessment Tool Used FOTO /clinical judgment    Functional Limitation Mobility: Walking and moving around   Mobility: Walking and Moving Around Current Status [phone]) At least 60 percent but less than 80 percent impaired, limited or restricted   Mobility: Walking and Moving Around Goal Status [phone]) At least 40 percent but less than 60 percent impaired, limited or restricted   Mobility: Walking and Moving Around Discharge Status [phone]) At least 60 percent but less than 80 percent impaired, limited or restricted      Problem List Patient Active Problem List   Diagnosis Date Noted  . Arthrofibrosis of knee joint 08/28/2015  . Left hip pain 10/08/2014  . Type 2 diabetes mellitus without complication ([place])  35/70/1779  . Hyperlipidemia LDL goal <100 10/08/2014  . Essential hypertension, benign 08/30/2014  . Diabetes mellitus, type 2 ([place]) 08/30/2014  . Diverticulosis of colon without hemorrhage 08/30/2014  . Chronic low back pain 08/30/2014  . Chronic pain  syndrome 08/30/2014  . Asthma in adult without complication 79/98/0012  . 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 Osteoarthritis of left knee 02/23/2012    PAA,JENNIFER , PTA 12/18/2015, 1:47 PM  [facility] [address] [place], [place], [address] Phone: [phone]   Fax:  [phone]  Name: Tabitha Oneal MRN: [id number] Date of Birth: 20-Jan-1956   PHYSICAL THERAPY DISCHARGE SUMMARY  Visits from Start of Care: 20  Current functional level related to goals / functional outcomes: See above, patient limited in knee AROM, balance, gait, strength and also has back pain as a result of altered gait.    Remaining deficits: See above    Education / Equipment: Gait, HEP, posture, RICE  Plan: Patient agrees to discharge.  Patient goals were partially met. Patient is being discharged due to lack of progress.  ?????    Raeford Razor, PT 12/18/15 1:47 PM Phone: [phone] Fax: [phone]

## 2015-12-19 ENCOUNTER — Encounter: Payer: Medicare Other | Admitting: Physical Therapy

## 2015-12-20 ENCOUNTER — Other Ambulatory Visit (HOSPITAL_COMMUNITY): Payer: Self-pay

## 2015-12-20 ENCOUNTER — Ambulatory Visit (HOSPITAL_COMMUNITY): Payer: Medicare Other | Attending: Cardiology

## 2015-12-20 DIAGNOSIS — R011 Cardiac murmur, unspecified: Secondary | ICD-10-CM | POA: Diagnosis not present

## 2015-12-20 DIAGNOSIS — I10 Essential (primary) hypertension: Secondary | ICD-10-CM | POA: Diagnosis not present

## 2015-12-20 DIAGNOSIS — I340 Nonrheumatic mitral (valve) insufficiency: Secondary | ICD-10-CM | POA: Diagnosis not present

## 2015-12-20 DIAGNOSIS — E119 Type 2 diabetes mellitus without complications: Secondary | ICD-10-CM | POA: Diagnosis not present

## 2015-12-20 DIAGNOSIS — E785 Hyperlipidemia, unspecified: Secondary | ICD-10-CM | POA: Diagnosis not present

## 2015-12-24 ENCOUNTER — Encounter: Payer: Medicare Other | Admitting: Physical Therapy

## 2015-12-26 ENCOUNTER — Encounter: Payer: Medicare Other | Admitting: Physical Therapy

## 2016-01-17 ENCOUNTER — Other Ambulatory Visit: Payer: Self-pay

## 2016-01-17 DIAGNOSIS — M25561 Pain in right knee: Secondary | ICD-10-CM | POA: Diagnosis not present

## 2016-01-17 DIAGNOSIS — E785 Hyperlipidemia, unspecified: Secondary | ICD-10-CM

## 2016-01-17 DIAGNOSIS — Z79891 Long term (current) use of opiate analgesic: Secondary | ICD-10-CM | POA: Diagnosis not present

## 2016-01-17 DIAGNOSIS — M25562 Pain in left knee: Secondary | ICD-10-CM | POA: Diagnosis not present

## 2016-01-17 DIAGNOSIS — G894 Chronic pain syndrome: Secondary | ICD-10-CM | POA: Diagnosis not present

## 2016-01-17 DIAGNOSIS — E119 Type 2 diabetes mellitus without complications: Secondary | ICD-10-CM

## 2016-01-17 DIAGNOSIS — M545 Low back pain: Secondary | ICD-10-CM | POA: Diagnosis not present

## 2016-01-21 ENCOUNTER — Ambulatory Visit (AMBULATORY_SURGERY_CENTER): Payer: Medicare Other

## 2016-01-21 VITALS — Ht 63.0 in | Wt 206.2 lb

## 2016-01-21 DIAGNOSIS — Z1211 Encounter for screening for malignant neoplasm of colon: Secondary | ICD-10-CM

## 2016-01-21 MED ORDER — NA SULFATE-K SULFATE-MG SULF 17.5-3.13-1.6 GM/177ML PO SOLN: ORAL | 0 refills | Status: DC

## 2016-01-21 NOTE — Progress Notes (Signed)
Per pt, no allergies to soy or egg products.Pt not taking any weight loss meds or using  O2 at home. 

## 2016-01-22 ENCOUNTER — Other Ambulatory Visit: Payer: Self-pay | Admitting: Internal Medicine

## 2016-02-04 ENCOUNTER — Encounter: Payer: Self-pay | Admitting: Gastroenterology

## 2016-02-04 ENCOUNTER — Ambulatory Visit (AMBULATORY_SURGERY_CENTER): Payer: Medicare Other | Admitting: Gastroenterology

## 2016-02-04 VITALS — BP 137/62 | HR 67 | Temp 96.8°F | Resp 12 | Ht 63.0 in | Wt 206.0 lb

## 2016-02-04 DIAGNOSIS — K635 Polyp of colon: Secondary | ICD-10-CM | POA: Diagnosis not present

## 2016-02-04 DIAGNOSIS — J45909 Unspecified asthma, uncomplicated: Secondary | ICD-10-CM | POA: Diagnosis not present

## 2016-02-04 DIAGNOSIS — D125 Benign neoplasm of sigmoid colon: Secondary | ICD-10-CM

## 2016-02-04 DIAGNOSIS — I10 Essential (primary) hypertension: Secondary | ICD-10-CM | POA: Diagnosis not present

## 2016-02-04 DIAGNOSIS — Z1211 Encounter for screening for malignant neoplasm of colon: Secondary | ICD-10-CM

## 2016-02-04 DIAGNOSIS — E119 Type 2 diabetes mellitus without complications: Secondary | ICD-10-CM | POA: Diagnosis not present

## 2016-02-04 LAB — GLUCOSE, CAPILLARY
GLUCOSE-CAPILLARY: 95 mg/dL (ref 65–99)
Glucose-Capillary: 107 mg/dL — ABNORMAL HIGH (ref 65–99)

## 2016-02-04 MED ORDER — SODIUM CHLORIDE 0.9 % IV SOLN: 500.0000 mL | INTRAVENOUS | Status: DC

## 2016-02-04 NOTE — Progress Notes (Signed)
Report to PACU, RN, vss, BBS= Clear.  

## 2016-02-04 NOTE — Op Note (Signed)
[place] Patient Name: Tabitha Oneal Procedure Date: 02/04/2016 7:56 AM MRN: [id number] Endoscopist: Mallie Mussel L. Loletha Carrow , MD Age: 60 Referring MD:  Date of Birth: 16-Nov-1955 Gender: Female Account #: [id number] Procedure:                Colonoscopy Indications:              Screening for colorectal malignant neoplasm, This                            is the patient's first colonoscopy Medicines:                Monitored Anesthesia Care Procedure:                Pre-Anesthesia Assessment:                           - Prior to the procedure, a History and Physical                            was performed, and patient medications and                            allergies were reviewed. The patient's tolerance of                            previous anesthesia was also reviewed. The risks                            and benefits of the procedure and the sedation                            options and risks were discussed with the patient.                            All questions were answered, and informed consent                            was obtained. Prior Anticoagulants: The patient has                            taken no previous anticoagulant or antiplatelet                            agents. ASA Grade Assessment: II - A patient with                            mild systemic disease. After reviewing the risks                            and benefits, the patient was deemed in                            satisfactory condition to undergo the procedure.  After obtaining informed consent, the colonoscope                            was passed under direct vision. Throughout the                            procedure, the patient's blood pressure, pulse, and                            oxygen saturations were monitored continuously. The                            Model CF-HQ190L [id number]) scope was introduced                            through the anus and  advanced to the the cecum,                            identified by appendiceal orifice and ileocecal                            valve. The colonoscopy was performed without                            difficulty. The patient tolerated the procedure                            well. The quality of the bowel preparation was                            good. The ileocecal valve, appendiceal orifice, and                            rectum were photographed. The quality of the bowel                            preparation was evaluated using the BBPS [facility]                            Bowel Preparation Scale) with scores of: Right                            Colon = 2, Transverse Colon = 2 and Left Colon = 2.                            The total BBPS score equals 6. The bowel                            preparation used was SUPREP. Scope In: 8:03:09 AM Scope Out: 8:16:17 AM Scope Withdrawal Time: 0 hours 9 minutes 59 seconds  Total Procedure Duration: 0 hours 13 minutes 8 seconds  Findings:                 The perianal  and digital rectal examinations were                            normal.                           A 4 mm polyp was found in the sigmoid colon. The                            polyp was sessile. The polyp was removed with a                            cold snare. Resection and retrieval were complete.                           The exam was otherwise without abnormality on                            direct and retroflexion views. Complications:            No immediate complications. Estimated Blood Loss:     Estimated blood loss: none. Impression:               - One 4 mm polyp in the sigmoid colon, removed with                            a cold snare. Resected and retrieved.                           - The examination was otherwise normal on direct                            and retroflexion views. Recommendation:           - Patient has a contact number available for                             emergencies. The signs and symptoms of potential                            delayed complications were discussed with the                            patient. Return to normal activities tomorrow.                            Written discharge instructions were provided to the                            patient.                           - Resume previous diet.                           - Continue present medications.                           -  Await pathology results.                           - Repeat colonoscopy is recommended for                            surveillance. The colonoscopy date will be                            determined after pathology results from today's                            exam become available for review. Henry L. Loletha Carrow, MD 02/04/2016 8:21:08 AM This report has been signed electronically.

## 2016-02-04 NOTE — Progress Notes (Signed)
Called to room to assist during endoscopic procedure.  Patient ID and intended procedure confirmed with present staff. Received instructions for my participation in the procedure from the performing physician.  

## 2016-02-04 NOTE — Patient Instructions (Signed)
YOU HAD AN ENDOSCOPIC PROCEDURE TODAY AT [facility]:   Refer to the procedure report that was given to you for any specific questions about what was found during the examination.  If the procedure report does not answer your questions, please call your gastroenterologist to clarify.  If you requested that your care partner not be given the details of your procedure findings, then the procedure report has been included in a sealed envelope for you to review at your convenience later.  YOU SHOULD EXPECT: Some feelings of bloating in the abdomen. Passage of more gas than usual.  Walking can help get rid of the air that was put into your GI tract during the procedure and reduce the bloating. If you had a lower endoscopy (such as a colonoscopy or flexible sigmoidoscopy) you may notice spotting of blood in your stool or on the toilet paper. If you underwent a bowel prep for your procedure, you may not have a normal bowel movement for a few days.  Please Note:  You might notice some irritation and congestion in your nose or some drainage.  This is from the oxygen used during your procedure.  There is no need for concern and it should clear up in a day or so.  SYMPTOMS TO REPORT IMMEDIATELY:   Following lower endoscopy (colonoscopy or flexible sigmoidoscopy):  Excessive amounts of blood in the stool  Significant tenderness or worsening of abdominal pains  Swelling of the abdomen that is new, acute  Fever of 100F or higher  For urgent or emergent issues, a gastroenterologist can be reached at any hour by calling [phone].   DIET:  We do recommend a small meal at first, but then you may proceed to your regular diet.  Drink plenty of fluids but you should avoid alcoholic beverages for 24 hours.  ACTIVITY:  You should plan to take it easy for the rest of today and you should NOT DRIVE or use heavy machinery until tomorrow (because of the sedation medicines used during the test).     FOLLOW UP: Our staff will call the number listed on your records the next business day following your procedure to check on you and address any questions or concerns that you may have regarding the information given to you following your procedure. If we do not reach you, we will leave a message.  However, if you are feeling well and you are not experiencing any problems, there is no need to return our call.  We will assume that you have returned to your regular daily activities without incident.  If any biopsies were taken you will be contacted by phone or by letter within the next 1-3 weeks.  Please call us at [phone] if you have not heard about the biopsies in 3 weeks.    SIGNATURES/CONFIDENTIALITY: You and/or your care partner have signed paperwork which will be entered into your electronic medical record.  These signatures attest to the fact that that the information above on your After Visit Summary has been reviewed and is understood.  Full responsibility of the confidentiality of this discharge information lies with you and/or your care-partner.  Please read over handout about polyps  Await pathology  Please continue your normal medications

## 2016-02-05 ENCOUNTER — Telehealth: Payer: Self-pay

## 2016-02-05 NOTE — Telephone Encounter (Signed)
  Follow up Call-  Call back number 02/04/2016  Post procedure Call Back phone  # [phone] hm  Permission to leave phone message Yes  Some recent data might be hidden     Patient questions:  Do you have a fever, pain , or abdominal swelling? No. Pain Score  0 *  Have you tolerated food without any problems? Yes.    Have you been able to return to your normal activities? Yes.    Do you have any questions about your discharge instructions: Diet   No. Medications  No. Follow up visit  No.  Do you have questions or concerns about your Care? Yes.    Actions: * If pain score is 4 or above: No action needed, pain <4.   Pt said she has not been able to urinate "like I normally do".  She said she was passing urine but a decreased amount.  I told her she should call her primary care md if this sx does not improve. Pt said she would.  No further complaints. maw

## 2016-02-08 ENCOUNTER — Encounter: Payer: Self-pay | Admitting: Gastroenterology

## 2016-02-16 ENCOUNTER — Other Ambulatory Visit: Payer: Self-pay | Admitting: Internal Medicine

## 2016-02-20 ENCOUNTER — Other Ambulatory Visit: Payer: Medicare Other

## 2016-02-20 ENCOUNTER — Other Ambulatory Visit: Payer: Self-pay | Admitting: Internal Medicine

## 2016-02-20 DIAGNOSIS — E119 Type 2 diabetes mellitus without complications: Secondary | ICD-10-CM

## 2016-02-20 DIAGNOSIS — Z1159 Encounter for screening for other viral diseases: Secondary | ICD-10-CM | POA: Diagnosis not present

## 2016-02-20 DIAGNOSIS — E785 Hyperlipidemia, unspecified: Secondary | ICD-10-CM | POA: Diagnosis not present

## 2016-02-20 LAB — BASIC METABOLIC PANEL WITH GFR
BUN: 11 mg/dL (ref 7–25)
CHLORIDE: 103 mmol/L (ref 98–110)
CO2: 27 mmol/L (ref 20–31)
Calcium: 9.5 mg/dL (ref 8.6–10.4)
Creat: 0.83 mg/dL (ref 0.50–0.99)
GFR, EST NON AFRICAN AMERICAN: 76 mL/min (ref >=60)
GFR, Est African American: 88 mL/min (ref >=60)
Glucose, Bld: 114 mg/dL — ABNORMAL HIGH (ref 65–99)
Potassium: 4.1 mmol/L (ref 3.5–5.3)
SODIUM: 141 mmol/L (ref 135–146)

## 2016-02-20 LAB — LIPID PANEL
CHOLESTEROL: 157 mg/dL (ref 125–200)
HDL: 59 mg/dL (ref >=46)
LDL Cholesterol: 87 mg/dL (ref <130)
TRIGLYCERIDES: 56 mg/dL (ref <150)
Total CHOL/HDL Ratio: 2.7 Ratio (ref <=5.0)
VLDL: 11 mg/dL (ref <30)

## 2016-02-20 LAB — ALT: ALT: 20 U/L (ref 6–29)

## 2016-02-21 LAB — HEMOGLOBIN A1C
HEMOGLOBIN A1C: 6.2 % — AB (ref <5.7)
MEAN PLASMA GLUCOSE: 131 mg/dL

## 2016-02-22 ENCOUNTER — Encounter: Payer: Self-pay | Admitting: Internal Medicine

## 2016-02-22 ENCOUNTER — Other Ambulatory Visit: Payer: Self-pay | Admitting: *Deleted

## 2016-02-22 ENCOUNTER — Ambulatory Visit (INDEPENDENT_AMBULATORY_CARE_PROVIDER_SITE_OTHER): Payer: Medicare Other | Admitting: Internal Medicine

## 2016-02-22 VITALS — BP 144/78 | HR 69 | Temp 98.0°F | Ht 64.96 in | Wt 205.8 lb

## 2016-02-22 DIAGNOSIS — J45909 Unspecified asthma, uncomplicated: Secondary | ICD-10-CM

## 2016-02-22 DIAGNOSIS — Z0000 Encounter for general adult medical examination without abnormal findings: Secondary | ICD-10-CM | POA: Diagnosis not present

## 2016-02-22 DIAGNOSIS — N959 Unspecified menopausal and perimenopausal disorder: Secondary | ICD-10-CM | POA: Insufficient documentation

## 2016-02-22 DIAGNOSIS — E119 Type 2 diabetes mellitus without complications: Secondary | ICD-10-CM | POA: Diagnosis not present

## 2016-02-22 DIAGNOSIS — Z23 Encounter for immunization: Secondary | ICD-10-CM | POA: Diagnosis not present

## 2016-02-22 DIAGNOSIS — E785 Hyperlipidemia, unspecified: Secondary | ICD-10-CM

## 2016-02-22 DIAGNOSIS — I10 Essential (primary) hypertension: Secondary | ICD-10-CM | POA: Diagnosis not present

## 2016-02-22 DIAGNOSIS — E669 Obesity, unspecified: Secondary | ICD-10-CM | POA: Diagnosis not present

## 2016-02-22 DIAGNOSIS — M25562 Pain in left knee: Secondary | ICD-10-CM

## 2016-02-22 LAB — TSH: TSH: 1.19 mIU/L

## 2016-02-22 LAB — HEPATITIS C ANTIBODY: HCV Ab: NEGATIVE

## 2016-02-22 MED ORDER — LINAGLIPTIN 5 MG PO TABS: 5.0000 mg | ORAL_TABLET | Freq: Every day | ORAL | 1 refills | Status: DC

## 2016-02-22 MED ORDER — PHENTERMINE HCL 15 MG PO CAPS: 15.0000 mg | ORAL_CAPSULE | ORAL | 1 refills | Status: DC

## 2016-02-22 NOTE — Telephone Encounter (Signed)
[facility] [address] 

## 2016-02-22 NOTE — Progress Notes (Addendum)
Patient ID: Tabitha Oneal, female   DOB: 08/29/1955, 60 y.o.   MRN: [id number]   Location:  [facility]  Place of Service:  OFFICE  Provider: Arletha Grippe, DO  Patient Care Team: Gildardo Cranker, DO as PCP - General (Internal Medicine)  Extended Emergency Contact Information Primary Emergency Contact: Junius Finner Address: [address]          [place], [place] [address] Johnnette Litter of [place] Phone: [phone] Relation: Daughter Secondary Emergency Contact: Adona of [place] Phone: [phone] Relation: Daughter  Code Status: FULL CODE Goals of Care: Advanced Directive information Advanced Directives 02/22/2016  Does patient have an advance directive? No  Would patient like information on creating an advanced directive? Yes - Educational materials given  Pre-existing out of facility DNR order (yellow form or pink MOST form) -     Chief Complaint  Patient presents with  . Annual Exam    Yearly exam  . Advanced Directive    discuss advance directive  . discuss sweats    sweating alot   . diabetic foot exam    HPI: Patient is a 60 y.o. female seen in today for an annual wellness exam. She reports increased sweating x several mos that drenches her clothes. She is postmenopausal and has never taken any meds for it.  She is c/a weight and has increased water intake and reduced calories. She does not exercise much due to her knee pain. She has gained 2 lbs since last OV   DM - she does not check BS on a regular basis. BS 127 last night. Controlled overall. She takes metformin and tradjenta. No low BS reactions. Occasional tingling in left foot. A1c 6.2%. Random urine microalbumin 15.8  HTN - BP controlled on quinapril  Hyperlipidemia - noticed increased HA and myalgias with lower dose of statin. LDL 87.   Asthma - followed by asthma specialist. She takes singulair, Qvar, prn albuterol HFA and astelin spray. She has sinus HA and  post nasal drip in AM.   OA/chronic pain - continues to have left knee pain despite having knee surgery in April 2017. She is followed by Ortho Dr Nicki Reaper. She wears left knee brace. Taking PT. Exercise tolerance low due to pain. Constipation stable on amitiza. She takes oxycodone TID, baclofen and meloxicam.  She uses a cane to ambulate. She still has difficulty walking   Depression screen [employer] [employer] 2/9 02/22/2016 08/22/2015 08/30/2014  Decreased Interest 0 0 0  Down, Depressed, Hopeless 0 0 0  PHQ - 2 Score 0 0 0    Fall Risk  02/22/2016 11/23/2015 08/22/2015 04/13/2015 01/05/2015  Falls in the past year? No Yes No Yes Yes  Number falls in past yr: - 1 - 1 1  Injury with Fall? - No - No Yes   No flowsheet data found.   Health Maintenance  Topic Date Due  . Hepatitis C Screening  04[birth year]  . PNEUMOCOCCAL POLYSACCHARIDE VACCINE (1) 12/17/1956  . HIV Screening  12/17/1969  . TETANUS/TDAP  12/17/1973  . ZOSTAVAX  12/18/2014  . OPHTHALMOLOGY EXAM  09/04/2015  . FOOT EXAM  10/06/2015  . INFLUENZA VACCINE  12/25/2015  . HEMOGLOBIN A1C  08/19/2016  . PAP SMEAR  10/05/2017  . MAMMOGRAM  12/03/2017  . COLONOSCOPY  02/03/2026    Urinary incontinence? none  Functional Status Survey: Is the patient deaf or have difficulty hearing?: No Does the patient have difficulty seeing, even when wearing glasses/contacts?: No Does the  patient have difficulty concentrating, remembering, or making decisions?: No Does the patient have difficulty walking or climbing stairs?: Yes Does the patient have difficulty dressing or bathing?: No Does the patient have difficulty doing errands alone such as visiting a doctor's office or shopping?: No  Exercise? As tolerated but limited due to right knee pain. No formal exercise routine  Diet? Has cut back on sweets and reduced calories  No exam data present  Hearing: no issues    Dentition: followed by dentist  Pain: right knee - she had TKR earlier this year.  Followed by Ortho  Past Medical History:  Diagnosis Date  . Asthma    QVAR daily and Albuterol as needed  . Bladder infection    taking Keflex daily   . Chronic back pain    reason unknown  . Diabetes mellitus    takes Metformin and Tradjenta daily  . GERD (gastroesophageal reflux disease)    takes Omeprazole daily  . Hard of hearing   . Headache    daily.Takes Excedrine daily  . History of blood transfusion    no abnormal reaction noted  . Hyperlipidemia    takes Atorvastatin daily  . Hypertension    takes Quinapril daily  . Joint pain   . Joint swelling   . Nocturia   . Osteoarthritis of left knee 02/23/2012  . Seasonal allergies    takes Claritin and Singulair daily.Nasal spray as needed  . Shortness of breath dyspnea    with exertion    Past Surgical History:  Procedure Laterality Date  . ABDOMINAL HYSTERECTOMY    . INJECTION KNEE  04/09/2012   Procedure: KNEE INJECTION;  Surgeon: Johnny Bridge, MD;  Location: [facility];  Service: Orthopedics;  Laterality: Left;  . JOINT REPLACEMENT     acl   bil knees  . KNEE ARTHROTOMY Left 08/28/2015   Procedure: LEFT KNEE ARTHROFIBROSIS EXCISION; pylectomy;  Surgeon: Meredith Pel, MD;  Location: [facility];  Service: Orthopedics;  Laterality: Left;  . KNEE CLOSED REDUCTION  04/09/2012   Procedure: CLOSED MANIPULATION KNEE;  Surgeon: Johnny Bridge, MD;  Location: [facility];  Service: Orthopedics;  Laterality: Left;  Manipulation Knee with Anesthesia includes Application of Traction   . TOTAL KNEE ARTHROPLASTY  02/23/2012   Procedure: TOTAL KNEE ARTHROPLASTY;  Surgeon: Johnny Bridge, MD;  Location: [facility];  Service: Orthopedics;  Laterality: Left;    Family History  Problem Relation Age of Onset  . Bronchitis Father 22  . Alzheimer's disease Mother   . Diabetes Daughter   . Diabetes Daughter   . Diabetes    . Colon cancer Neg Hx   . Esophageal cancer Neg Hx   . Rectal cancer Neg Hx   .  Stomach cancer Neg Hx    Family Status  Relation Status  . Father Deceased at age 32  . Mother Alive  . Sister Alive  . Sister Alive  . Sister Alive  . Brother Alive  . Sister Alive  . Sister Alive  . Sister Alive  . Son Alive  . Daughter Alive  . Daughter Alive  . Daughter Alive  . Daughter   . Daughter   .    Marland Kitchen Neg Hx     Social History   Social History  . Marital status: Widowed    Spouse name: N/A  . Number of children: N/A  . Years of education: N/A   Occupational History  . Not  on file.   Social History Main Topics  . Smoking status: Never Smoker  . Smokeless tobacco: Never Used  . Alcohol use No  . Drug use: No  . Sexual activity: Not on file   Other Topics Concern  . Not on file   Social History Narrative   Diet: none   Do you drink/eat things with caffeine ? Coffee    Material status: widow     What year were you married? 08/01/1978   Do you live in a house, apartment, assisted living,condo, trailer,ect.)? Townhouse (temporary)    Is it one or more stories? Yes   How many persons live in your home? Two   Do you have any pets in your home ? Yes, Shih-tzu   Current or past profession: none   Do you exercise? No  Type & how often: no   Do you have a living will ? No   Do you have a DNR form? No   If not, do you want to discuss one?  Not now   Do you have signed POA /HPOA forms? No    If so, please bring to your appointment.                                     No Known Allergies    Medication List       Accurate as of 02/22/16 10:21 AM. Always use your most recent med list.          albuterol 108 (90 Base) MCG/ACT inhaler Commonly known as:  PROVENTIL HFA;VENTOLIN HFA Inhale 2 puffs into the lungs every 4 (four) hours as needed. For wheezing   AMITIZA 24 MCG capsule Generic drug:  lubiprostone TAKE 1 CAPSULE TWICE A DAY WITH A MEAL   aspirin-acetaminophen-caffeine 250-250-65 MG tablet Commonly known as:  EXCEDRIN  MIGRAINE Take 2 tablets by mouth daily.   atorvastatin 40 MG tablet Commonly known as:  LIPITOR TAKE 1 TABLET EVERY DAY   azelastine 0.1 % nasal spray Commonly known as:  ASTELIN Use 1 spray each nostril twice daily   baclofen 10 MG tablet Commonly known as:  LIORESAL Take 10 mg by mouth. 1 in morning 2 at bed time   beclomethasone 80 MCG/ACT inhaler Commonly known as:  QVAR Use 2 puffs twice daily rinse, gargle and spit after use   [facility] LORATADINE 10 MG tablet Generic drug:  loratadine TAKE 1 TABLET EVERY DAY   diclofenac sodium 1 % Gel Commonly known as:  VOLTAREN Apply 8 g topically 2 (two) times daily as needed. On knees   linagliptin 5 MG Tabs tablet Commonly known as:  TRADJENTA Take 1 tablet (5 mg total) by mouth daily.   metFORMIN 500 MG tablet Commonly known as:  GLUCOPHAGE TAKE ONE TABLET BY MOUTH TWICE DAILY TO CONTROL BLOOD SUGAR   montelukast 10 MG tablet Commonly known as:  SINGULAIR Take 1 tablet (10 mg total) by mouth at bedtime.   omeprazole 40 MG capsule Commonly known as:  PRILOSEC TAKE 1 CAPSULE EVERY DAY   ONE TOUCH ULTRA TEST test strip Generic drug:  glucose blood Check blood sugar once daily as directed   Oxycodone HCl 10 MG Tabs Take 1 tablet (10 mg total) by mouth 4 (four) times daily as needed.   quinapril 20 MG tablet Commonly known as:  ACCUPRIL TAKE 1 TABLET BY MOUTH AT BEDTIME  Review of Systems:  Review of Systems  Musculoskeletal: Positive for gait problem and joint swelling.  All other systems reviewed and are negative.   Physical Exam: Vitals:   02/22/16 0953  BP: (!) 144/78- no meds yet  Pulse: 69  Temp: 98 F (36.7 C)  TempSrc: Oral  SpO2: 98%  Weight: 205 lb 12.8 oz (93.4 kg)  Height: 5' 4.96" (1.65 m)   Body mass index is 34.29 kg/m. Physical Exam  Constitutional: She is oriented to person, place, and time. She appears well-developed and well-nourished. No distress.  Looks uncomfortable in NAD   HENT:  Head: Normocephalic and atraumatic.  Right Ear: Hearing, tympanic membrane, external ear and ear canal normal.  Left Ear: Hearing, tympanic membrane, external ear and ear canal normal.  Mouth/Throat: Uvula is midline, oropharynx is clear and moist and mucous membranes are normal. She does not have dentures. No oropharyngeal exudate.  Eyes: Conjunctivae, EOM and lids are normal. Pupils are equal, round, and reactive to light. No scleral icterus.  Neck: Trachea normal and normal range of motion. Neck supple. Carotid bruit is present (b/l systolic). No tracheal deviation present. No thyroid mass and no thyromegaly present.  Cardiovascular: Normal rate, regular rhythm and intact distal pulses.  Exam reveals no gallop and no friction rub.   Murmur (2/6 SEM --> carotid b/l) heard. +1 pitting LLE edema but none on right. no calf TTP b/l.   Pulmonary/Chest: Effort normal and breath sounds normal. No stridor. No respiratory distress. She has no wheezes. She has no rhonchi. She has no rales. Right breast exhibits no inverted nipple, no mass, no nipple discharge, no skin change and no tenderness. Left breast exhibits no inverted nipple, no mass, no nipple discharge, no skin change and no tenderness. Breasts are symmetrical.  Abdominal: Soft. Normal appearance, normal aorta and bowel sounds are normal. She exhibits no distension, no pulsatile midline mass and no mass. There is no hepatosplenomegaly or hepatomegaly. There is no tenderness. There is no rigidity, no rebound and no guarding. No hernia.  Musculoskeletal: Normal range of motion. She exhibits edema and tenderness.  L>R knee swelling with increased warmth to touch. uses cane to ambulate; gait antalgic  Lymphadenopathy:       Head (right side): No posterior auricular adenopathy present.       Head (left side): No posterior auricular adenopathy present.    She has no cervical adenopathy.       Right: No supraclavicular adenopathy present.        Left: No supraclavicular adenopathy present.  Neurological: She is alert and oriented to person, place, and time. She has normal strength. No cranial nerve deficit. Gait normal.  Absent DTR left knee  Skin: Skin is warm, dry and intact. No rash noted. Nails show no clubbing.  Skin is moist  Psychiatric: She has a normal mood and affect. Her behavior is normal. Judgment and thought content normal. Her speech is slurred. Cognition and memory are normal.   Diabetic Foot Exam - Simple   Simple Foot Form Diabetic Foot exam was performed with the following findings:  Yes 02/22/2016 10:57 AM  Visual Inspection See comments:  Yes Sensation Testing Pulse Check Posterior Tibialis and Dorsalis pulse intact bilaterally:  Yes Comments Skin peeling b/l plantar. No calluses or ulcerations. Hammertoes noted     Labs reviewed:  Basic Metabolic Panel:  Recent Labs  08/15/15 1321 11/21/15 1026 02/20/16 1000  NA 143 144 141  K 3.8 4.1 4.1  CL 106  103 103  CO2 27 27 27   GLUCOSE 117* 130* 114*  BUN 8 8 11   CREATININE 0.80 0.75 0.83  CALCIUM 10.0 9.6 9.5   Liver Function Tests:  Recent Labs  04/11/15 0912 11/21/15 1026 02/20/16 1000  AST  --  13  --   ALT 13 9 20   ALKPHOS  --  182*  --   BILITOT  --  0.3  --   PROT  --  7.5  --   ALBUMIN  --  4.4  --    No results for input(s): LIPASE, AMYLASE in the last 8760 hours. No results for input(s): AMMONIA in the last 8760 hours. CBC:  Recent Labs  08/03/15 1232 08/15/15 1321  WBC 5.8 7.3  NEUTROABS 3.7  --   HGB 11.4* 11.1*  HCT 35.1* 35.8*  MCV 80.5 80.6  PLT 289 272   Lipid Panel:  Recent Labs  04/11/15 0912 11/21/15 1026 02/20/16 1000  CHOL 129 140 157  HDL 50 55 59  LDLCALC 64 74 87  TRIG 77 55 56  CHOLHDL 2.6 2.5 2.7   Lab Results  Component Value Date   HGBA1C 6.2 (H) 02/20/2016    Procedures: No results found.  Assessment/Plan   ICD-9-CM ICD-10-CM   1. Medicare annual wellness visit, subsequent  V70.0 Z00.00   2. Obesity 278.00 E66.9   3. Left knee pain 719.46 M25.562   4. Essential hypertension, benign 401.1 I10   5. Type 2 diabetes mellitus without complication, without long-term current use of insulin (HCC) 250.00 E11.9   6. Hyperlipidemia LDL goal <100 272.4 E78.5   7. Asthma in adult without complication 123456 [id number]   8. Postmenopausal symptoms 627.9 N95.9    hot flashes  9. Encounter for immunization Z23 Z23 Flu Vaccine QUAD 36+ mos IM     Pt is UTD on health maintenance. Vaccinations are UTD. Pt maintains a healthy lifestyle. Encouraged pt to exercise 30-45 minutes 4-5 times per week. Eat a well balanced diet. Avoid smoking. Limit alcohol intake. Wear seatbelt when riding in the car. Wear sun block (SPF >50) when spending extended times outside.  Recommend water aerobics to help reduce joint pain  Start phentermine daily for weight. Side effects discusses  Reduce calories to 1200 daily  Flu shot given today  Continue other medications as ordered  Will call with lab results (thyroid and Hep C screen)  Follow up in 1 month for weight. Will discuss HRT at next [facility]. Perlie Gold  [facility] [address] [place], [place] [address] [phone] Cell (Monday-Friday 8 AM - 5 PM) [phone] After 5 PM and follow prompts

## 2016-02-22 NOTE — Patient Instructions (Addendum)
Encouraged her to exercise 30-45 minutes 4-5 times per week. Eat a well balanced diet. Avoid smoking. Limit alcohol intake. Wear seatbelt when riding in the car. Wear sun block (SPF >50) when spending extended times outside.  Recommend water aerobics to help reduce joint pain  Start phentermine daily for weight. Side effects discusses  Reduce calories to 1200 daily  Flu shot given today  Continue other medications as ordered  Will call with lab results (thyroid and Hep C screen)  Follow up in 1 month for weight.  Obesity Obesity is having too much body fat and a body mass index (BMI) of 30 or more. BMI is a number that is based on your height and weight. BMI is usually figured out by your doctor during regular wellness visits. The number is an estimate of how much body fat you have. Obesity can happen if you eat more calories than you can burn with exercise or other activity. It can cause major health problems or emergencies.  HOME CARE  Exercise and be active as told by your doctor. Try:  Using stairs when you can.  Parking farther away from store doors.  Gardening, biking, or walking.  Eat healthy foods and drinks that are low in calories. Eat more fruits and vegetables.  Limit fast food, sweets, and snack foods that are made with ingredients that are not natural (processed food).  Eat smaller amounts of food.  Keep a journal and write down what you eat every day. Websites can help with this.  Avoid drinking alcohol. Drink more water and drinks that have no calories.  Take vitamins and dietary pills (supplements) only as told by your doctor.  Try going to weight-loss support groups or classes. These can help to lessen stress. Dietitians and counselors may also help. GET HELP RIGHT AWAY IF:  You have pain or tightness in your chest.  You have trouble breathing or feel short of breath.  You feel weak or have loss of feeling (numbness) in your legs.  You feel confused  or have trouble talking.  You have sudden changes in your vision.   This information is not intended to replace advice given to you by your health care provider. Make sure you discuss any questions you have with your health care provider.   Document Released: 08/04/2011 Document Revised: 06/02/2014 Document Reviewed: 08/04/2011 Elsevier Interactive Patient Education [employer].

## 2016-03-07 DIAGNOSIS — G894 Chronic pain syndrome: Secondary | ICD-10-CM | POA: Diagnosis not present

## 2016-03-07 DIAGNOSIS — M25562 Pain in left knee: Secondary | ICD-10-CM | POA: Diagnosis not present

## 2016-03-07 DIAGNOSIS — Z79891 Long term (current) use of opiate analgesic: Secondary | ICD-10-CM | POA: Diagnosis not present

## 2016-03-07 DIAGNOSIS — M25561 Pain in right knee: Secondary | ICD-10-CM | POA: Diagnosis not present

## 2016-03-07 DIAGNOSIS — M545 Low back pain: Secondary | ICD-10-CM | POA: Diagnosis not present

## 2016-03-19 ENCOUNTER — Other Ambulatory Visit: Payer: Self-pay | Admitting: *Deleted

## 2016-03-19 MED ORDER — LINAGLIPTIN 5 MG PO TABS: 5.0000 mg | ORAL_TABLET | Freq: Every day | ORAL | 1 refills | Status: DC

## 2016-03-19 NOTE — Telephone Encounter (Signed)
[facility] [address] 

## 2016-04-02 DIAGNOSIS — M25562 Pain in left knee: Secondary | ICD-10-CM | POA: Diagnosis not present

## 2016-04-02 DIAGNOSIS — M545 Low back pain: Secondary | ICD-10-CM | POA: Diagnosis not present

## 2016-04-02 DIAGNOSIS — Z79891 Long term (current) use of opiate analgesic: Secondary | ICD-10-CM | POA: Diagnosis not present

## 2016-04-02 DIAGNOSIS — G894 Chronic pain syndrome: Secondary | ICD-10-CM | POA: Diagnosis not present

## 2016-04-11 ENCOUNTER — Other Ambulatory Visit: Payer: Self-pay | Admitting: Internal Medicine

## 2016-04-28 ENCOUNTER — Encounter: Payer: Self-pay | Admitting: Allergy & Immunology

## 2016-04-28 ENCOUNTER — Ambulatory Visit (INDEPENDENT_AMBULATORY_CARE_PROVIDER_SITE_OTHER): Payer: Medicare Other | Admitting: Allergy & Immunology

## 2016-04-28 VITALS — BP 120/78 | HR 78 | Temp 97.7°F | Resp 16

## 2016-04-28 DIAGNOSIS — J4531 Mild persistent asthma with (acute) exacerbation: Secondary | ICD-10-CM

## 2016-04-28 DIAGNOSIS — J302 Other seasonal allergic rhinitis: Secondary | ICD-10-CM

## 2016-04-28 MED ORDER — ALBUTEROL SULFATE 108 (90 BASE) MCG/ACT IN AEPB: 4.0000 | INHALATION_SPRAY | RESPIRATORY_TRACT | 1 refills | Status: DC | PRN

## 2016-04-28 MED ORDER — FLUTICASONE FUROATE-VILANTEROL 100-25 MCG/INH IN AEPB: 1.0000 | INHALATION_SPRAY | Freq: Every day | RESPIRATORY_TRACT | 5 refills | Status: DC

## 2016-04-28 NOTE — Patient Instructions (Addendum)
1. Other seasonal allergic rhinitis - Continue with Astelin 1-2 sprays 1-2 times daily. - Continue with Singulair 10mg  daily.  2. Mild persistent asthma with acute exacerbation - Lung testing showed evidence of obstructive disease (which is seen in asthma). - STOP the Qvar and Ventolin. - Start the prednisone packet provided in clinic today and continue until gone. - Daily controller medication(s): Breo 100/25 one puff once daily - Rescue medications: ProAir RespiClick four puffs every 4-6 hours as needed - Asthma control goals:  * Full participation in all desired activities (may need albuterol before activity) * Albuterol use two time or less a week on average (not counting use with activity) * Cough interfering with sleep two time or less a month * Oral steroids no more than once a year * No hospitalizations  3. Return in about 3 months (around 07/27/2016).  Please inform us of any Emergency Department visits, hospitalizations, or changes in symptoms. Call us before going to the ED for breathing or allergy symptoms since we might be able to fit you in for a sick visit. Feel free to contact us anytime with any questions, problems, or concerns.  It was a pleasure to meet you today! Have a wonderful holiday season!   Websites that have reliable patient information: 1. American Academy of Asthma, Allergy, and Immunology: www.aaaai.org 2. Food Allergy Research and Education (FARE): foodallergy.org 3. Mothers of Asthmatics: http://www.asthmacommunitynetwork.org 4. American College of Allergy, Asthma, and Immunology: www.acaai.org

## 2016-04-28 NOTE — Progress Notes (Signed)
FOLLOW UP  Date of Service/Encounter:  04/28/16   Assessment:   Other seasonal allergic rhinitis  Mild persistent asthma with acute exacerbation   Asthma Reportables:  Severity: mild persistent  Risk: high due to health literacy issues as well as compliance issues Control: not well controlled  Seasonal Influenza Vaccine: yes    Plan/Recommendations:   1. Other seasonal allergic rhinitis - Continue with Astelin 1-2 sprays 1-2 times daily. - Continue with Singulair 10mg  daily.  2. Mild persistent asthma with acute exacerbation - Lung testing showed evidence of obstructive disease (which is seen in asthma). - STOP the Qvar and Ventolin. - We will change to [facility] which will have easier dosing and will not require a spacer.  - Start the prednisone packet provided in clinic today and continue until gone. - Daily controller medication(s): Breo 100/25 one puff once daily - Rescue medications: ProAir RespiClick four puffs every 4-6 hours as needed - Asthma control goals:  * Full participation in all desired activities (may need albuterol before activity) * Albuterol use two time or less a week on average (not counting use with activity) * Cough interfering with sleep two time or less a month * Oral steroids no more than once a year * No hospitalizations  3. Return in about 3 months (around 07/27/2016).    Subjective:   Tabitha Oneal is a 60 y.o. female presenting today for follow up of  Chief Complaint  Patient presents with  . Asthma    Flare-worse over past week.  Does wake pt up during the night.  . Cough    Nonproductive . Not using Qvar everyday.  . Wheezing  . Nasal Congestion  .  Tabitha Oneal has a history of the following: Patient Active Problem List   Diagnosis Date Noted  . Postmenopausal symptoms 02/22/2016  . Arthrofibrosis of knee joint 08/28/2015  . Left hip pain 10/08/2014  . Type 2 diabetes mellitus without complication ([place])  [id number]  . Hyperlipidemia LDL goal <100 10/08/2014  . Essential hypertension, benign 08/30/2014  . Diabetes mellitus, type 2 ([place]) 08/30/2014  . Diverticulosis of colon without hemorrhage 08/30/2014  . Chronic low back pain 08/30/2014  . Chronic pain syndrome 08/30/2014  . Asthma in adult without complication [id number]  . 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 Osteoarthritis of left knee 02/23/2012    History obtained from: chart review and patient (poo historian)  Tabitha Oneal was referred by Gildardo Cranker, DO.     Tabitha Oneal is a 60 y.o. female presenting for a sick visit. Tabitha Oneal was last seen in July 2017 by Dr. Ishmael Holter, who has since left the practice. At that time, she was treated for an asthma exacerbation. She was given a sample of Dulera 100/5 two puffs twice daily to use and then transition back to Qvar 48mcg two puffs twice daily. She was continued on Singulair 10mg  daily as well as cetirizine 10mg  daily. She was continued on Astelin one spray per nostril twice daily as well.   She presents today as a sick visit. Since the last time we saw her, she has done well. For the last week, she has been short of breath and having wheezing and coughing. She is having rhinorrhea. She thinks that it is just a cold. She has Qvar which is appears she has not been using correctly. She has been using the Ventolin a little bit more lately. She estimates that she wakes up every  night with coughing. She does not have a spacer and seems confused when we discuss that. She is unable to tell me how often she needs a burst of prednisone, but she did receive one at the last visit. She denies hospitalizations for her breathing.  Otherwise, there have been no changes to her past medical history, surgical history, family history, or social history.    Review of Systems: a 14-point review of systems is pertinent for what is mentioned in HPI.  Otherwise, all other  systems were negative. Constitutional: negative other than that listed in the HPI Eyes: negative other than that listed in the HPI Ears, nose, mouth, throat, and face: negative other than that listed in the HPI Respiratory: negative other than that listed in the HPI Cardiovascular: negative other than that listed in the HPI Gastrointestinal: negative other than that listed in the HPI Genitourinary: negative other than that listed in the HPI Integument: negative other than that listed in the HPI Hematologic: negative other than that listed in the HPI Musculoskeletal: negative other than that listed in the HPI Neurological: negative other than that listed in the HPI Allergy/Immunologic: negative other than that listed in the HPI    Objective:   Blood pressure 120/78, pulse 78, temperature 97.7 F (36.5 C), temperature source Oral, resp. rate 16, SpO2 98 %. There is no height or weight on file to calculate BMI.   Physical Exam:  General: Alert, interactive, in no acute distress. Cooperative with the exam. Friendly. Poor historian. HEENT: TMs pearly gray, turbinates edematous without discharge, post-pharynx mildly erythematous. Neck: Supple without thyromegaly. Lungs: Decreased breath sounds with expiratory wheezing bilaterally. No increased work of breathing. CV: Normal S1/S2, no murmurs. Capillary refill <2 seconds.  Abdomen: Nondistended, nontender. No guarding or rebound tenderness. Bowel sounds faint and present in all fields  Skin: Warm and dry, without lesions or rashes. Extremities:  No clubbing, cyanosis or edema. Neuro:   Grossly intact.  Diagnostic studies:  Spirometry: results abnormal (FEV1: 1.41/67%, FVC: 2.08/79%, FEV1/FVC: 68%).    Spirometry consistent with mild obstructive disease. DuoNeb nebulizer treatment given in clinic with significant improvement. FEV1 increased 10% M.D[facility] increased 3%. The FEF 25-75% increased 40%. Postbronchodilator physical exam was  notable for increased air movement especially at the bases.  Allergy Studies: None   Salvatore Marvel, MD [facility] [place]

## 2016-04-30 DIAGNOSIS — G894 Chronic pain syndrome: Secondary | ICD-10-CM | POA: Diagnosis not present

## 2016-04-30 DIAGNOSIS — M545 Low back pain: Secondary | ICD-10-CM | POA: Diagnosis not present

## 2016-04-30 DIAGNOSIS — M25562 Pain in left knee: Secondary | ICD-10-CM | POA: Diagnosis not present

## 2016-04-30 DIAGNOSIS — G890 Central pain syndrome: Secondary | ICD-10-CM | POA: Diagnosis not present

## 2016-04-30 DIAGNOSIS — Z79891 Long term (current) use of opiate analgesic: Secondary | ICD-10-CM | POA: Diagnosis not present

## 2016-05-05 ENCOUNTER — Other Ambulatory Visit: Payer: Self-pay | Admitting: Internal Medicine

## 2016-05-06 ENCOUNTER — Other Ambulatory Visit: Payer: Self-pay | Admitting: Internal Medicine

## 2016-05-06 ENCOUNTER — Ambulatory Visit (HOSPITAL_COMMUNITY)
Admission: RE | Admit: 2016-05-06 | Discharge: 2016-05-06 | Disposition: A | Discharge status: Home / Self Care | Payer: Medicare Other | Source: Ambulatory Visit | Attending: Internal Medicine | Admitting: Internal Medicine

## 2016-05-06 DIAGNOSIS — E119 Type 2 diabetes mellitus without complications: Secondary | ICD-10-CM

## 2016-05-06 DIAGNOSIS — E785 Hyperlipidemia, unspecified: Secondary | ICD-10-CM

## 2016-05-06 DIAGNOSIS — R0989 Other specified symptoms and signs involving the circulatory and respiratory systems: Secondary | ICD-10-CM

## 2016-05-28 DIAGNOSIS — M25562 Pain in left knee: Secondary | ICD-10-CM | POA: Diagnosis not present

## 2016-05-28 DIAGNOSIS — Z79891 Long term (current) use of opiate analgesic: Secondary | ICD-10-CM | POA: Diagnosis not present

## 2016-05-28 DIAGNOSIS — G894 Chronic pain syndrome: Secondary | ICD-10-CM | POA: Diagnosis not present

## 2016-05-28 DIAGNOSIS — M545 Low back pain: Secondary | ICD-10-CM | POA: Diagnosis not present

## 2016-05-28 DIAGNOSIS — G890 Central pain syndrome: Secondary | ICD-10-CM | POA: Diagnosis not present

## 2016-06-01 ENCOUNTER — Other Ambulatory Visit: Payer: Self-pay | Admitting: Internal Medicine

## 2016-06-05 ENCOUNTER — Other Ambulatory Visit: Payer: Self-pay | Admitting: Internal Medicine

## 2016-06-13 ENCOUNTER — Other Ambulatory Visit: Payer: Self-pay

## 2016-06-13 ENCOUNTER — Other Ambulatory Visit: Payer: Self-pay | Admitting: Internal Medicine

## 2016-06-13 MED ORDER — AZELASTINE HCL 0.1 % NA SOLN: NASAL | 1 refills | Status: DC

## 2016-06-20 ENCOUNTER — Other Ambulatory Visit: Payer: Self-pay | Admitting: Allergy & Immunology

## 2016-06-25 DIAGNOSIS — G890 Central pain syndrome: Secondary | ICD-10-CM | POA: Diagnosis not present

## 2016-06-25 DIAGNOSIS — M545 Low back pain: Secondary | ICD-10-CM | POA: Diagnosis not present

## 2016-06-25 DIAGNOSIS — M25562 Pain in left knee: Secondary | ICD-10-CM | POA: Diagnosis not present

## 2016-06-25 DIAGNOSIS — Z79891 Long term (current) use of opiate analgesic: Secondary | ICD-10-CM | POA: Diagnosis not present

## 2016-06-25 DIAGNOSIS — G894 Chronic pain syndrome: Secondary | ICD-10-CM | POA: Diagnosis not present

## 2016-07-23 ENCOUNTER — Other Ambulatory Visit: Payer: Self-pay | Admitting: Internal Medicine

## 2016-07-29 DIAGNOSIS — M25562 Pain in left knee: Secondary | ICD-10-CM | POA: Diagnosis not present

## 2016-07-29 DIAGNOSIS — M79605 Pain in left leg: Secondary | ICD-10-CM | POA: Diagnosis not present

## 2016-07-29 DIAGNOSIS — G894 Chronic pain syndrome: Secondary | ICD-10-CM | POA: Diagnosis not present

## 2016-07-29 DIAGNOSIS — M545 Low back pain: Secondary | ICD-10-CM | POA: Diagnosis not present

## 2016-07-29 DIAGNOSIS — Z79891 Long term (current) use of opiate analgesic: Secondary | ICD-10-CM | POA: Diagnosis not present

## 2016-07-30 ENCOUNTER — Encounter: Payer: Self-pay | Admitting: Allergy & Immunology

## 2016-07-30 ENCOUNTER — Ambulatory Visit (INDEPENDENT_AMBULATORY_CARE_PROVIDER_SITE_OTHER): Payer: Medicare Other | Admitting: Allergy & Immunology

## 2016-07-30 VITALS — BP 140/72 | HR 70 | Temp 98.3°F | Wt 205.2 lb

## 2016-07-30 DIAGNOSIS — J4540 Moderate persistent asthma, uncomplicated: Secondary | ICD-10-CM | POA: Diagnosis not present

## 2016-07-30 DIAGNOSIS — J302 Other seasonal allergic rhinitis: Secondary | ICD-10-CM

## 2016-07-30 LAB — PULMONARY FUNCTION TEST

## 2016-07-30 NOTE — Progress Notes (Signed)
FOLLOW UP  Date of Service/Encounter:  07/30/16   Assessment:   Moderate persistent asthma, uncomplicated  Seasonal allergic rhinitis   Asthma Reportables:  Severity: moderate persistent  Risk: low Control: well controlled    Plan/Recommendations:   1. Other seasonal allergic rhinitis - Continue with Astelin 1-2 sprays 1-2 times daily. - Continue with Singulair 10mg  daily.  2. Moderate persistent asthma, uncomplicated - Lung testing showed evidence of obstructive disease (which is seen in asthma). - STOP the Qvar and Ventolin. - Start the prednisone packet provided in clinic today and continue until gone. - Daily controller medication(s): Breo 100/25 one puff once daily - Rescue medications: ProAir RespiClick four puffs every 4-6 hours as needed - Asthma control goals:  * Full participation in all desired activities (may need albuterol before activity) * Albuterol use two time or less a week on average (not counting use with activity) * Cough interfering with sleep two time or less a month * Oral steroids no more than once a year * No hospitalizations  3. Return in about 6 months (around 01/30/2017).   Subjective:   Tabitha Oneal is a 61 y.o. female presenting today for follow up of  Chief Complaint  Patient presents with  . Asthma    Well controlled with [place] has a history of the following: Patient Active Problem List   Diagnosis Date Noted  . Postmenopausal symptoms 02/22/2016  . Arthrofibrosis of knee joint 08/28/2015  . Left hip pain 10/08/2014  . Type 2 diabetes mellitus without complication ([place]) 38/02/1750  . Hyperlipidemia LDL goal <100 10/08/2014  . Essential hypertension, benign 08/30/2014  . Diabetes mellitus, type 2 ([place]) 08/30/2014  . Diverticulosis of colon without hemorrhage 08/30/2014  . Chronic low back pain 08/30/2014  . Chronic pain syndrome 08/30/2014  . Asthma in adult without complication 02/58/5277    . 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 Osteoarthritis of left knee 02/23/2012    History obtained from: chart review and patient.  Tabitha Oneal was referred by Gildardo Cranker, DO.     Tabitha Oneal is a 62 y.o. female presenting for a follow up visit. She was last seen in December 2017. At that time, we continued her on her Astelin and Singulair. She was also experiencing an asthma exacerbation. We stopped her Qvar and changed her to Breo 100/25 one inhalation once daily. Prior to this, she had tried Dulera 100/5 but then change back to Qvar after her samples run out.  Since last visit, she has done well. She is using her ProAir once per month or less. She is very happy with the Breo compared to the Qvar. Deoni's asthma has been well controlled. She has not required rescue medication, experienced nocturnal awakenings due to lower respiratory symptoms, nor have activities of daily living been limited. She has had no ED visits ir Urgent Care visit.   Her allergic rhinitis is well controlled. She will occasionally get rhinorrhea. She remains on Astelin and Flonase as well as Singulair. She does have intermittent nasal congestion.    Otherwise, there have been no changes to her past medical history, surgical history, family history, or social history. She does have diabetes which is well controlled. She sees Gildardo Cranker for her diabetes. She does have a history of chronic pain that is not well controlled. She treats this with various pain medications including opioids. She is currently on disability secondary to an injury  sustained at work.     Review of Systems: a 14-point review of systems is pertinent for what is mentioned in HPI.  Otherwise, all other systems were negative. Constitutional: negative other than that listed in the HPI Eyes: negative other than that listed in the HPI Ears, nose, mouth, throat, and face: negative other than that listed in the  HPI Respiratory: negative other than that listed in the HPI Cardiovascular: negative other than that listed in the HPI Gastrointestinal: negative other than that listed in the HPI Genitourinary: negative other than that listed in the HPI Integument: negative other than that listed in the HPI Hematologic: negative other than that listed in the HPI Musculoskeletal: negative other than that listed in the HPI Neurological: negative other than that listed in the HPI Allergy/Immunologic: negative other than that listed in the HPI    Objective:   Blood pressure 140/72, pulse 70, temperature 98.3 F (36.8 C), temperature source Oral, weight 205 lb 3.2 oz (93.1 kg), SpO2 99 %. Body mass index is 34.19 kg/m.   Physical Exam:  General: Alert, interactive, in no acute distress. Pleasant. Appreciative. Eyes: No conjunctival injection present on the right, No conjunctival injection present on the left, PERRL bilaterally, No discharge on the right, No discharge on the left and No Horner-Trantas dots present Ears: Right TM pearly gray with normal light reflex, Left TM pearly gray with normal light reflex, Right TM intact without perforation and Left TM intact without perforation.  Nose/Throat: External nose within normal limits, nasal crease present and septum midline, turbinates edematous and pale with clear discharge, post-pharynx erythematous without cobblestoning in the posterior oropharynx. Tonsils 2+ without exudates Neck: Supple without thyromegaly. Lungs: Clear to auscultation without wheezing, rhonchi or rales. No increased work of breathing. CV: Normal S1/S2, no murmurs. Capillary refill <2 seconds.  Skin: Warm and dry, without lesions or rashes. Neuro:   Grossly intact. No focal deficits appreciated. Responsive to questions.   Diagnostic studies:  Spirometry: results normal (FEV1: 1.81/92%, FVC: 2.43/100%, FEV1/FVC: 74%).    Spirometry consistent with normal pattern.   Allergy  Studies: None    Salvatore Marvel, MD [facility] [place]

## 2016-07-30 NOTE — Patient Instructions (Addendum)
1. Other seasonal allergic rhinitis - Continue with Astelin 1-2 sprays 1-2 times daily. - Continue with Singulair 10mg  daily.  2. Moderate persistent asthma, uncomplicated - Lung testing showed evidence of obstructive disease (which is seen in asthma). - STOP the Qvar and Ventolin. - Start the prednisone packet provided in clinic today and continue until gone. - Daily controller medication(s): Breo 100/25 one puff once daily - Rescue medications: ProAir RespiClick four puffs every 4-6 hours as needed - Asthma control goals:  * Full participation in all desired activities (may need albuterol before activity) * Albuterol use two time or less a week on average (not counting use with activity) * Cough interfering with sleep two time or less a month * Oral steroids no more than once a year * No hospitalizations  3. Return in about 6 months (around 01/30/2017).  Please inform us of any Emergency Department visits, hospitalizations, or changes in symptoms. Call us before going to the ED for breathing or allergy symptoms since we might be able to fit you in for a sick visit. Feel free to contact us anytime with any questions, problems, or concerns.  It was a pleasure to meet you today! Have a wonderful holiday season!   Websites that have reliable patient information: 1. American Academy of Asthma, Allergy, and Immunology: www.aaaai.org 2. Food Allergy Research and Education (FARE): foodallergy.org 3. Mothers of Asthmatics: http://www.asthmacommunitynetwork.org 4. American College of Allergy, Asthma, and Immunology: www.acaai.org po

## 2016-07-31 ENCOUNTER — Ambulatory Visit (INDEPENDENT_AMBULATORY_CARE_PROVIDER_SITE_OTHER): Payer: Medicare Other | Admitting: Nurse Practitioner

## 2016-07-31 ENCOUNTER — Encounter: Payer: Self-pay | Admitting: Nurse Practitioner

## 2016-07-31 VITALS — BP 124/82 | HR 71 | Temp 97.9°F | Resp 18 | Ht 65.0 in | Wt 204.2 lb

## 2016-07-31 DIAGNOSIS — Z23 Encounter for immunization: Secondary | ICD-10-CM | POA: Diagnosis not present

## 2016-07-31 DIAGNOSIS — E119 Type 2 diabetes mellitus without complications: Secondary | ICD-10-CM | POA: Diagnosis not present

## 2016-07-31 DIAGNOSIS — J4520 Mild intermittent asthma, uncomplicated: Secondary | ICD-10-CM

## 2016-07-31 DIAGNOSIS — I10 Essential (primary) hypertension: Secondary | ICD-10-CM | POA: Diagnosis not present

## 2016-07-31 DIAGNOSIS — Z6833 Body mass index (BMI) 33.0-33.9, adult: Secondary | ICD-10-CM | POA: Diagnosis not present

## 2016-07-31 DIAGNOSIS — G894 Chronic pain syndrome: Secondary | ICD-10-CM | POA: Diagnosis not present

## 2016-07-31 LAB — CBC WITH DIFFERENTIAL/PLATELET
Basophils Absolute: 56 cells/uL (ref 0–200)
Basophils Relative: 1 %
EOS PCT: 3 %
Eosinophils Absolute: 168 cells/uL (ref 15–500)
HCT: 33.6 % — ABNORMAL LOW (ref 35.0–45.0)
HEMOGLOBIN: 11 g/dL — AB (ref 11.7–15.5)
Lymphocytes Relative: 26 %
Lymphs Abs: 1456 cells/uL (ref 850–3900)
MCH: 25.8 pg — ABNORMAL LOW (ref 27.0–33.0)
MCHC: 32.7 g/dL (ref 32.0–36.0)
MCV: 78.9 fL — ABNORMAL LOW (ref 80.0–100.0)
MONOS PCT: 6 %
MPV: 11.3 fL (ref 7.5–12.5)
Monocytes Absolute: 336 cells/uL (ref 200–950)
NEUTROS ABS: 3584 {cells}/uL (ref 1500–7800)
Neutrophils Relative %: 64 %
PLATELETS: 316 10*3/uL (ref 140–400)
RBC: 4.26 MIL/uL (ref 3.80–5.10)
RDW: 14.9 % (ref 11.0–15.0)
WBC: 5.6 10*3/uL (ref 3.8–10.8)

## 2016-07-31 LAB — COMPLETE METABOLIC PANEL WITH GFR
ALBUMIN: 4.3 g/dL (ref 3.6–5.1)
ALK PHOS: 141 U/L — AB (ref 33–130)
ALT: 10 U/L (ref 6–29)
AST: 14 U/L (ref 10–35)
BILIRUBIN TOTAL: 0.5 mg/dL (ref 0.2–1.2)
BUN: 7 mg/dL (ref 7–25)
CO2: 27 mmol/L (ref 20–31)
Calcium: 9.9 mg/dL (ref 8.6–10.4)
Chloride: 104 mmol/L (ref 98–110)
Creat: 0.87 mg/dL (ref 0.50–0.99)
GFR, EST NON AFRICAN AMERICAN: 72 mL/min (ref >=60)
GFR, Est African American: 83 mL/min (ref >=60)
GLUCOSE: 117 mg/dL — AB (ref 65–99)
POTASSIUM: 4.6 mmol/L (ref 3.5–5.3)
SODIUM: 140 mmol/L (ref 135–146)
TOTAL PROTEIN: 7.5 g/dL (ref 6.1–8.1)

## 2016-07-31 LAB — HEMOGLOBIN A1C
HEMOGLOBIN A1C: 6.3 % — AB (ref <5.7)
MEAN PLASMA GLUCOSE: 134 mg/dL

## 2016-07-31 NOTE — Progress Notes (Signed)
Careteam: Patient Care Team: Gildardo Cranker, DO as PCP - General (Internal Medicine)  Advanced Directive information Does Patient Have a Medical Advance Directive?: No, Would patient like information on creating a medical advance directive?: Yes (MAU/Ambulatory/Procedural Areas - Information given)  No Known Allergies  Chief Complaint  Patient presents with  . Medical Management of Chronic Issues    Pt is being seen for routine visit.      HPI: Patient is a 61 y.o. female seen in the office today for routine follow up. Pt was supposed to follow up in 1 month from AWV due to obesity and needing to eat better and increase exercise.  She was started on phentermine and placed on a 1200 calorie diet.  Took phentermine for 30 days. Did not help. Did not reduce calories.  Increased exercise but does not keep up with it.  ~45 mins 6 days a week. Does not need rescue inhaler often- last time she used was in January Constipation-  amitiza helps some- using this with water which helps.  Chronic pain managed by pain clinic  Review of Systems:  Review of Systems  Unable to perform ROS: Other  Constitutional: Negative for activity change, appetite change, chills, diaphoresis and fever.  HENT: Negative for ear pain and sore throat.   Eyes: Negative for visual disturbance.  Respiratory: Negative for cough, chest tightness and shortness of breath.   Cardiovascular: Negative for chest pain, palpitations and leg swelling.  Gastrointestinal: Positive for constipation. Negative for abdominal pain, blood in stool, diarrhea, nausea and vomiting.  Genitourinary: Negative for dysuria.  Musculoskeletal: Positive for arthralgias, gait problem and myalgias.       Goes to pain management- on chronic pain medication for pain in left knee and lower back  Neurological: Negative for dizziness, tremors, numbness and headaches.  Psychiatric/Behavioral: Negative for sleep disturbance. The patient is not  nervous/anxious.      Past Medical History:  Diagnosis Date  . Asthma    QVAR daily and Albuterol as needed  . Bladder infection    taking Keflex daily   . Chronic back pain    reason unknown  . Diabetes mellitus    takes Metformin and Tradjenta daily  . GERD (gastroesophageal reflux disease)    takes Omeprazole daily  . Hard of hearing   . Headache    daily.Takes Excedrine daily  . History of blood transfusion    no abnormal reaction noted  . Hyperlipidemia    takes Atorvastatin daily  . Hypertension    takes Quinapril daily  . Joint pain   . Joint swelling   . Nocturia   . Osteoarthritis of left knee 02/23/2012  . Seasonal allergies    takes Claritin and Singulair daily.Nasal spray as needed  . Shortness of breath dyspnea    with exertion   Past Surgical History:  Procedure Laterality Date  . ABDOMINAL HYSTERECTOMY    . INJECTION KNEE  04/09/2012   Procedure: KNEE INJECTION;  Surgeon: Johnny Bridge, MD;  Location: [facility];  Service: Orthopedics;  Laterality: Left;  . JOINT REPLACEMENT     acl   bil knees  . KNEE ARTHROTOMY Left 08/28/2015   Procedure: LEFT KNEE ARTHROFIBROSIS EXCISION; pylectomy;  Surgeon: Meredith Pel, MD;  Location: [place];  Service: Orthopedics;  Laterality: Left;  . KNEE CLOSED REDUCTION  04/09/2012   Procedure: CLOSED MANIPULATION KNEE;  Surgeon: Johnny Bridge, MD;  Location: [facility];  Service:  Orthopedics;  Laterality: Left;  Manipulation Knee with Anesthesia includes Application of Traction   . TOTAL KNEE ARTHROPLASTY  02/23/2012   Procedure: TOTAL KNEE ARTHROPLASTY;  Surgeon: Johnny Bridge, MD;  Location: [facility];  Service: Orthopedics;  Laterality: Left;   Social History:   reports that she has never smoked. She has never used smokeless tobacco. She reports that she does not drink alcohol or use drugs.  Family History  Problem Relation Age of Onset  . Bronchitis Father 78  . Alzheimer's disease  Mother   . Diabetes Daughter   . Diabetes Daughter   . Diabetes    . Colon cancer Neg Hx   . Esophageal cancer Neg Hx   . Rectal cancer Neg Hx   . Stomach cancer Neg Hx     Medications: Patient's Medications  New Prescriptions   No medications on file  Previous Medications   ALBUTEROL SULFATE (PROAIR RESPICLICK) 323 (90 BASE) MCG/ACT AEPB    Inhale 4 puffs into the lungs every 4 (four) hours as needed.   AMITIZA 24 MCG CAPSULE    TAKE 1 CAPSULE TWICE A DAY WITH A MEAL   ASPIRIN-ACETAMINOPHEN-CAFFEINE (EXCEDRIN MIGRAINE) 250-250-65 MG PER TABLET    Take 2 tablets by mouth daily.   ATORVASTATIN (LIPITOR) 40 MG TABLET    TAKE 1 TABLET EVERY DAY   AZELASTINE (ASTELIN) 0.1 % NASAL SPRAY    Use 1 spray each nostril twice daily   BACLOFEN (LIORESAL) 10 MG TABLET    Take 10 mg by mouth. 1 in morning 2 at bed time   BREO ELLIPTA 100-25 MCG/INH AEPB    INHALE 1 PUFF' BY MOUTH INTO THE LUNGS DAILY.   DICLOFENAC SODIUM (VOLTAREN) 1 % GEL    Apply 8 g topically 2 (two) times daily as needed. On knees   LINAGLIPTIN (TRADJENTA) 5 MG TABS TABLET    Take 1 tablet (5 mg total) by mouth daily.   LORATADINE (CLARITIN) 10 MG TABLET    APPOINTMENT OVERDUE 1 tablet by mouth daily   METFORMIN (GLUCOPHAGE) 500 MG TABLET    TAKE ONE TABLET BY MOUTH TWICE DAILY TO CONTROL BLOOD SUGAR   MONTELUKAST (SINGULAIR) 10 MG TABLET    TAKE 1 TABLET (10 MG TOTAL) BY MOUTH AT BEDTIME.   OMEPRAZOLE (PRILOSEC) 40 MG CAPSULE    TAKE 1 CAPSULE EVERY DAY   ONE TOUCH ULTRA TEST TEST STRIP    Check blood sugar once daily as directed   OXYCODONE HCL 10 MG TABS    Take 1 tablet (10 mg total) by mouth 4 (four) times daily as needed.   PHENTERMINE 15 MG CAPSULE    Take 1 capsule (15 mg total) by mouth every morning.   QUINAPRIL (ACCUPRIL) 20 MG TABLET    TAKE 1 TABLET BY MOUTH AT BEDTIME  Modified Medications   No medications on file  Discontinued Medications   No medications on file     Physical Exam:  Vitals:   07/31/16  1054  BP: 124/82  Pulse: 71  Resp: 18  Temp: 97.9 F (36.6 C)  TempSrc: Oral  SpO2: 98%  Weight: 204 lb 3.2 oz (92.6 kg)  Height: _0  (1.651 m)   Body mass index is 33.98 kg/m.  Physical Exam  Constitutional: She is oriented to person, place, and time. She appears well-developed and well-nourished.  Eyes: Pupils are equal, round, and reactive to light.  Neck: Neck supple.  Cardiovascular: Normal rate, regular rhythm and intact distal pulses.  Exam reveals no gallop and no friction rub.   Murmur heard. Pulmonary/Chest: Effort normal and breath sounds normal. No respiratory distress. She has no wheezes. She has no rales.  Abdominal: Soft. Bowel sounds are normal. There is no hepatomegaly.  Musculoskeletal: She exhibits no edema.  L>R knee swelling with increased warmth to touch. Left knee immobilizer intact; uses cane to ambulate; gait antalgic  Neurological: She is alert and oriented to person, place, and time.  Skin: Skin is warm and dry.  Psychiatric: She has a normal mood and affect. Her behavior is normal. Judgment and thought content normal. Her speech is slurred.    Labs reviewed: Basic Metabolic Panel:  Recent Labs  08/15/15 1321 11/21/15 1026 02/20/16 1000  NA 143 144 141  K 3.8 4.1 4.1  CL 106 103 103  CO2 _0 GLUCOSE 117* 130* 114*  BUN _1 CREATININE 0.80 0.75 0.83  CALCIUM 10.0 9.6 9.5  TSH  --   --  1.19   Liver Function Tests:  Recent Labs  11/21/15 1026 02/20/16 1000  AST 13  --   ALT 9 20  ALKPHOS 182*  --   BILITOT 0.3  --   PROT 7.5  --   ALBUMIN 4.4  --    No results for input(s): LIPASE, AMYLASE in the last 8760 hours. No results for input(s): AMMONIA in the last 8760 hours. CBC:  Recent Labs  08/03/15 1232 08/15/15 1321  WBC 5.8 7.3  NEUTROABS 3.7  --   HGB 11.4* 11.1*  HCT 35.1* 35.8*  MCV 80.5 80.6  PLT 289 272   Lipid Panel:  Recent Labs  11/21/15 1026 02/20/16 1000  CHOL 140 157  HDL 55 59  LDLCALC  74 87  TRIG 55 56  CHOLHDL 2.5 2.7   TSH:  Recent Labs  02/20/16 1000  TSH 1.19   A1C: Lab Results  Component Value Date   HGBA1C 6.2 (H) 02/20/2016     Assessment/Plan 1. Essential hypertension, benign -blood pressure stable, cont current regimen. Lifestyle modifications encouraged. - CMP with eGFR  2. Type 2 diabetes mellitus without complication, without long-term current use of insulin (HCC) Pt reports she has trouble with diet modifications. Will cont metformin and tradjenta .  - CBC with Differential/Platelets - Hemoglobin A1c  3. Need for pneumococcal vaccine - Pneumococcal polysaccharide vaccine 23-valent greater than or equal to 2yo subcutaneous/IM  4. BMI 33.0-33.9,adult Encouraged weight loss cont activity, 30 mins 5 days a week -DASH diet given.   5. Mild intermittent asthma in adult without complication Stable following with Allergist. conts on breo with PRN albuterol.   6. Chronic pain syndrome -managed by pain clinic.   Follow up in 6 months, sooner if needed    Jessica K. Harle Battiest  [facility] [phone] 8 am - 5 pm) [phone] (after hours)

## 2016-07-31 NOTE — Patient Instructions (Signed)
DASH Eating Plan DASH stands for "Dietary Approaches to Stop Hypertension." The DASH eating plan is a healthy eating plan that has been shown to reduce high blood pressure (hypertension). It may also reduce your risk for type 2 diabetes, heart disease, and stroke. The DASH eating plan may also help with weight loss. What are tips for following this plan? General guidelines  Avoid eating more than 2,300 mg (milligrams) of salt (sodium) a day. If you have hypertension, you may need to reduce your sodium intake to 1,500 mg a day.  Limit alcohol intake to no more than 1 drink a day for nonpregnant women and 2 drinks a day for men. One drink equals 12 oz of beer, 5 oz of wine, or 1 oz of hard liquor.  Work with your health care provider to maintain a healthy body weight or to lose weight. Ask what an ideal weight is for you.  Get at least 30 minutes of exercise that causes your heart to beat faster (aerobic exercise) most days of the week. Activities may include walking, swimming, or biking.  Work with your health care provider or diet and nutrition specialist (dietitian) to adjust your eating plan to your individual calorie needs. Reading food labels  Check food labels for the amount of sodium per serving. Choose foods with less than 5 percent of the Daily Value of sodium. Generally, foods with less than 300 mg of sodium per serving fit into this eating plan.  To find whole grains, look for the word "whole" as the first word in the ingredient list. Shopping  Buy products labeled as "low-sodium" or "no salt added."  Buy fresh foods. Avoid canned foods and premade or frozen meals. Cooking  Avoid adding salt when cooking. Use salt-free seasonings or herbs instead of table salt or sea salt. Check with your health care provider or pharmacist before using salt substitutes.  Do not fry foods. Cook foods using healthy methods such as baking, boiling, grilling, and broiling instead.  Cook with  heart-healthy oils, such as olive, canola, soybean, or sunflower oil. Meal planning   Eat a balanced diet that includes: ? 5 or more servings of fruits and vegetables each day. At each meal, try to fill half of your plate with fruits and vegetables. ? Up to 6-8 servings of whole grains each day. ? Less than 6 oz of lean meat, poultry, or fish each day. A 3-oz serving of meat is about the same size as a deck of cards. One egg equals 1 oz. ? 2 servings of low-fat dairy each day. ? A serving of nuts, seeds, or beans 5 times each week. ? Heart-healthy fats. Healthy fats called Omega-3 fatty acids are found in foods such as flaxseeds and coldwater fish, like sardines, salmon, and mackerel.  Limit how much you eat of the following: ? Canned or prepackaged foods. ? Food that is high in trans fat, such as fried foods. ? Food that is high in saturated fat, such as fatty meat. ? Sweets, desserts, sugary drinks, and other foods with added sugar. ? Full-fat dairy products.  Do not salt foods before eating.  Try to eat at least 2 vegetarian meals each week.  Eat more home-cooked food and less restaurant, buffet, and fast food.  When eating at a restaurant, ask that your food be prepared with less salt or no salt, if possible. What foods are recommended? The items listed may not be a complete list. Talk with your dietitian about what   dietary choices are best for you. Grains Whole-grain or whole-wheat bread. Whole-grain or whole-wheat pasta. Brown rice. Oatmeal. Quinoa. Bulgur. Whole-grain and low-sodium cereals. Pita bread. Low-fat, low-sodium crackers. Whole-wheat flour tortillas. Vegetables Fresh or frozen vegetables (raw, steamed, roasted, or grilled). Low-sodium or reduced-sodium tomato and vegetable juice. Low-sodium or reduced-sodium tomato sauce and tomato paste. Low-sodium or reduced-sodium canned vegetables. Fruits All fresh, dried, or frozen fruit. Canned fruit in natural juice (without  added sugar). Meat and other protein foods Skinless chicken or turkey. Ground chicken or turkey. Pork with fat trimmed off. Fish and seafood. Egg whites. Dried beans, peas, or lentils. Unsalted nuts, nut butters, and seeds. Unsalted canned beans. Lean cuts of beef with fat trimmed off. Low-sodium, lean deli meat. Dairy Low-fat (1%) or fat-free (skim) milk. Fat-free, low-fat, or reduced-fat cheeses. Nonfat, low-sodium ricotta or cottage cheese. Low-fat or nonfat yogurt. Low-fat, low-sodium cheese. Fats and oils Soft margarine without trans fats. Vegetable oil. Low-fat, reduced-fat, or light mayonnaise and salad dressings (reduced-sodium). Canola, safflower, olive, soybean, and sunflower oils. Avocado. Seasoning and other foods Herbs. Spices. Seasoning mixes without salt. Unsalted popcorn and pretzels. Fat-free sweets. What foods are not recommended? The items listed may not be a complete list. Talk with your dietitian about what dietary choices are best for you. Grains Baked goods made with fat, such as croissants, muffins, or some breads. Dry pasta or rice meal packs. Vegetables Creamed or fried vegetables. Vegetables in a cheese sauce. Regular canned vegetables (not low-sodium or reduced-sodium). Regular canned tomato sauce and paste (not low-sodium or reduced-sodium). Regular tomato and vegetable juice (not low-sodium or reduced-sodium). Pickles. Olives. Fruits Canned fruit in a light or heavy syrup. Fried fruit. Fruit in cream or butter sauce. Meat and other protein foods Fatty cuts of meat. Ribs. Fried meat. Bacon. Sausage. Bologna and other processed lunch meats. Salami. Fatback. Hotdogs. Bratwurst. Salted nuts and seeds. Canned beans with added salt. Canned or smoked fish. Whole eggs or egg yolks. Chicken or turkey with skin. Dairy Whole or 2% milk, cream, and half-and-half. Whole or full-fat cream cheese. Whole-fat or sweetened yogurt. Full-fat cheese. Nondairy creamers. Whipped toppings.  Processed cheese and cheese spreads. Fats and oils Butter. Stick margarine. Lard. Shortening. Ghee. Bacon fat. Tropical oils, such as coconut, palm kernel, or palm oil. Seasoning and other foods Salted popcorn and pretzels. Onion salt, garlic salt, seasoned salt, table salt, and sea salt. Worcestershire sauce. Tartar sauce. Barbecue sauce. Teriyaki sauce. Soy sauce, including reduced-sodium. Steak sauce. Canned and packaged gravies. Fish sauce. Oyster sauce. Cocktail sauce. Horseradish that you find on the shelf. Ketchup. Mustard. Meat flavorings and tenderizers. Bouillon cubes. Hot sauce and Tabasco sauce. Premade or packaged marinades. Premade or packaged taco seasonings. Relishes. Regular salad dressings. Where to find more information:  National Heart, Lung, and Blood Institute: www.nhlbi.nih.gov  American Heart Association: www.heart.org Summary  The DASH eating plan is a healthy eating plan that has been shown to reduce high blood pressure (hypertension). It may also reduce your risk for type 2 diabetes, heart disease, and stroke.  With the DASH eating plan, you should limit salt (sodium) intake to 2,300 mg a day. If you have hypertension, you may need to reduce your sodium intake to 1,500 mg a day.  When on the DASH eating plan, aim to eat more fresh fruits and vegetables, whole grains, lean proteins, low-fat dairy, and heart-healthy fats.  Work with your health care provider or diet and nutrition specialist (dietitian) to adjust your eating plan to your individual   calorie needs. This information is not intended to replace advice given to you by your health care provider. Make sure you discuss any questions you have with your health care provider. Document Released: 05/01/2011 Document Revised: 05/05/2016 Document Reviewed: 05/05/2016 Elsevier Interactive Patient Education  2017 Elsevier Inc.  

## 2016-08-07 ENCOUNTER — Encounter: Payer: Self-pay | Admitting: Allergy & Immunology

## 2016-08-17 ENCOUNTER — Other Ambulatory Visit: Payer: Self-pay | Admitting: Allergy & Immunology

## 2016-09-04 DIAGNOSIS — M79605 Pain in left leg: Secondary | ICD-10-CM | POA: Diagnosis not present

## 2016-09-04 DIAGNOSIS — G890 Central pain syndrome: Secondary | ICD-10-CM | POA: Diagnosis not present

## 2016-09-04 DIAGNOSIS — G541 Lumbosacral plexus disorders: Secondary | ICD-10-CM | POA: Diagnosis not present

## 2016-09-04 DIAGNOSIS — G603 Idiopathic progressive neuropathy: Secondary | ICD-10-CM | POA: Diagnosis not present

## 2016-09-04 DIAGNOSIS — G894 Chronic pain syndrome: Secondary | ICD-10-CM | POA: Diagnosis not present

## 2016-09-17 ENCOUNTER — Other Ambulatory Visit: Payer: Self-pay | Admitting: *Deleted

## 2016-09-17 MED ORDER — METFORMIN HCL 500 MG PO TABS: ORAL_TABLET | ORAL | 1 refills | Status: DC

## 2016-09-17 NOTE — Telephone Encounter (Signed)
[facility] [address] 

## 2016-09-18 ENCOUNTER — Other Ambulatory Visit: Payer: Self-pay | Admitting: Internal Medicine

## 2016-10-02 ENCOUNTER — Other Ambulatory Visit: Payer: Self-pay

## 2016-10-02 MED ORDER — MONTELUKAST SODIUM 10 MG PO TABS: 10.0000 mg | ORAL_TABLET | Freq: Every day | ORAL | 1 refills | Status: DC

## 2016-10-02 NOTE — Telephone Encounter (Signed)
[facility] request refill

## 2016-10-04 ENCOUNTER — Emergency Department (HOSPITAL_COMMUNITY)
Admission: EM | Admit: 2016-10-04 | Discharge: 2016-10-04 | Discharge status: Against Medical Advice | Payer: Medicare Other

## 2016-10-04 LAB — CBG MONITORING, ED: GLUCOSE-CAPILLARY: 120 mg/dL — AB (ref 65–99)

## 2016-10-04 NOTE — ED Notes (Signed)
Pt stated her blood sugar is back to normal and does not want to be seen

## 2016-10-22 ENCOUNTER — Other Ambulatory Visit: Payer: Self-pay | Admitting: Internal Medicine

## 2016-10-22 DIAGNOSIS — Z1231 Encounter for screening mammogram for malignant neoplasm of breast: Secondary | ICD-10-CM

## 2016-10-23 DIAGNOSIS — G894 Chronic pain syndrome: Secondary | ICD-10-CM | POA: Diagnosis not present

## 2016-10-23 DIAGNOSIS — M79605 Pain in left leg: Secondary | ICD-10-CM | POA: Diagnosis not present

## 2016-10-23 DIAGNOSIS — G890 Central pain syndrome: Secondary | ICD-10-CM | POA: Diagnosis not present

## 2016-10-23 DIAGNOSIS — G603 Idiopathic progressive neuropathy: Secondary | ICD-10-CM | POA: Diagnosis not present

## 2016-10-23 DIAGNOSIS — G541 Lumbosacral plexus disorders: Secondary | ICD-10-CM | POA: Diagnosis not present

## 2016-10-29 ENCOUNTER — Encounter: Payer: Self-pay | Admitting: Internal Medicine

## 2016-11-12 ENCOUNTER — Other Ambulatory Visit: Payer: Self-pay | Admitting: Internal Medicine

## 2016-11-13 ENCOUNTER — Other Ambulatory Visit: Payer: Self-pay | Admitting: Allergy & Immunology

## 2016-11-21 DIAGNOSIS — M25562 Pain in left knee: Secondary | ICD-10-CM | POA: Diagnosis not present

## 2016-11-21 DIAGNOSIS — G894 Chronic pain syndrome: Secondary | ICD-10-CM | POA: Diagnosis not present

## 2016-11-21 DIAGNOSIS — Z79891 Long term (current) use of opiate analgesic: Secondary | ICD-10-CM | POA: Diagnosis not present

## 2016-11-21 DIAGNOSIS — M545 Low back pain: Secondary | ICD-10-CM | POA: Diagnosis not present

## 2016-11-21 DIAGNOSIS — M79605 Pain in left leg: Secondary | ICD-10-CM | POA: Diagnosis not present

## 2016-11-24 ENCOUNTER — Other Ambulatory Visit: Payer: Self-pay | Admitting: Internal Medicine

## 2016-12-04 ENCOUNTER — Ambulatory Visit
Admission: RE | Admit: 2016-12-04 | Discharge: 2016-12-04 | Disposition: A | Discharge status: Home / Self Care | Payer: Medicare Other | Source: Ambulatory Visit | Attending: Internal Medicine | Admitting: Internal Medicine

## 2016-12-04 DIAGNOSIS — Z1231 Encounter for screening mammogram for malignant neoplasm of breast: Secondary | ICD-10-CM

## 2016-12-14 ENCOUNTER — Other Ambulatory Visit: Payer: Self-pay | Admitting: Internal Medicine

## 2016-12-22 DIAGNOSIS — Z79891 Long term (current) use of opiate analgesic: Secondary | ICD-10-CM | POA: Diagnosis not present

## 2016-12-22 DIAGNOSIS — M545 Low back pain: Secondary | ICD-10-CM | POA: Diagnosis not present

## 2016-12-22 DIAGNOSIS — M25562 Pain in left knee: Secondary | ICD-10-CM | POA: Diagnosis not present

## 2016-12-22 DIAGNOSIS — G894 Chronic pain syndrome: Secondary | ICD-10-CM | POA: Diagnosis not present

## 2016-12-25 ENCOUNTER — Ambulatory Visit (INDEPENDENT_AMBULATORY_CARE_PROVIDER_SITE_OTHER): Payer: Medicare Other

## 2016-12-25 ENCOUNTER — Ambulatory Visit (INDEPENDENT_AMBULATORY_CARE_PROVIDER_SITE_OTHER): Payer: Medicare Other | Admitting: Orthopedic Surgery

## 2016-12-25 ENCOUNTER — Encounter (INDEPENDENT_AMBULATORY_CARE_PROVIDER_SITE_OTHER): Payer: Self-pay | Admitting: Orthopedic Surgery

## 2016-12-25 DIAGNOSIS — G8929 Other chronic pain: Secondary | ICD-10-CM

## 2016-12-25 DIAGNOSIS — M25562 Pain in left knee: Secondary | ICD-10-CM

## 2016-12-25 DIAGNOSIS — M25572 Pain in left ankle and joints of left foot: Secondary | ICD-10-CM | POA: Diagnosis not present

## 2016-12-25 NOTE — Progress Notes (Signed)
Office Visit Note   Patient: Tabitha Oneal           Date of Birth: 22-Nov-1955           MRN: [id number] Visit Date: 12/25/2016 Requested by: Gildardo Cranker, [address] [place], [place] [address] PCP: Gildardo Cranker, DOI'll(   Subjective: Chief Complaint  Patient presents with  . Left Foot - Pain  . Left Knee - Pain    HPI: Tabitha Oneal is a patient with left knee pain.  She had left total knee prosthesis placed by Dr. Mardelle Matte several years ago.  This is comp.  By arthrofibrosis.  I took her to surgery for scar removal but she was pretty reluctant to move the knee and she locked up again.  Cheilectomy performed at that time as well.  She also describes left foot pain.  She reports bony prominence on the medial aspect of the MTP joint.  She states this is painful for her in Effexor shoe wear.              ROS: All systems reviewed are negative as they relate to the chief complaint within the history of present illness.  Patient denies  fevers or chills.   Assessment & Plan: Visit Diagnoses:  1. Pain in left ankle and joints of left foot   2. Chronic pain of left knee     Plan: Impression is left knee pain and arthrofibrosis.  Do not recommend any further surgical intervention in any way shape or form for this knee.  She has a stiff knee and more surgery runs the risk of infection and potential amputation.  In regards to the left foot think she does have mild hallux valgus.  She would consider surgical intervention for the problem.  I don't know how great a nonoperative candidate she is in general but I will refer her to Dr. Sharol Given for further potential surgical management.  Follow-Up Instructions: No Follow-up on file.   Orders:  Orders Placed This Encounter  Procedures  . XR Foot Complete Left  . XR Knee 1-2 Views Left   No orders of the defined types were placed in this encounter.     Procedures: No procedures performed   Clinical Data: No additional  findings.  Objective: Vital Signs: There were no vitals taken for this visit.  Physical Exam:   Constitutional: Patient appears well-developed HEENT:  Head: Normocephalic Eyes:EOM are normal Neck: Normal range of motion Cardiovascular: Normal rate Pulmonary/chest: Effort normal Neurologic: Patient is alert Skin: Skin is warm Psychiatric: Patient has normal mood and affect    Ortho Exam: Orthopedic exam demonstrates stiff left knee with range of motion arc of about 10.  No warmth or effusion or swelling noted in the left knee.  Both feet are examined.  I'll hallux valgus deformity is noted.  Patient has palpable intact nontender anterior to posterior tib peroneal and Achilles tendons.  No real loss of range of motion passively of the MTP joint #1.  Tenderness is present at the bump on the hallux valgus deformity on the left foot  Specialty Comments:  No specialty comments available.  Imaging: Xr Foot Complete Left  Result Date: 12/25/2016 AP oblique lateral left foot reviewed.  Mild hallux valgus is noted.  Tarsometatarsal alignment normal.  No fracture seen.  No other abnormal soft tissue calcifications noted.  Xr Knee 1-2 Views Left  Result Date: 12/25/2016 Left knee AP lateral reviewed.  Total knee prosthesis in good position and  alignment.  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  No compensating features ostial lysis fractures seen.    PMFS History: Patient Active Problem List   Diagnosis Date Noted  . Postmenopausal symptoms 02/22/2016  . Arthrofibrosis of knee joint 08/28/2015  . Left hip pain 10/08/2014  . Type 2 diabetes mellitus without complication ([place]) 29/93/7169  . Hyperlipidemia LDL goal <100 10/08/2014  . Essential hypertension, benign 08/30/2014  . Diabetes mellitus, type 2 ([place]) 08/30/2014  . Diverticulosis of colon without hemorrhage 08/30/2014  . Chronic low back pain 08/30/2014  . Chronic pain syndrome 08/30/2014  . Asthma in adult without complication 67/89/3810  .  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 Osteoarthritis of left knee 02/23/2012   Past Medical History:  Diagnosis Date  . Asthma    QVAR daily and Albuterol as needed  . Bladder infection    taking Keflex daily   . Chronic back pain    reason unknown  . Diabetes mellitus    takes Metformin and Tradjenta daily  . GERD (gastroesophageal reflux disease)    takes Omeprazole daily  . Hard of hearing   . Headache    daily.Takes Excedrine daily  . History of blood transfusion    no abnormal reaction noted  . Hyperlipidemia    takes Atorvastatin daily  . Hypertension    takes Quinapril daily  . Joint pain   . Joint swelling   . Nocturia   . Osteoarthritis of left knee 02/23/2012  . Seasonal allergies    takes Claritin and Singulair daily.Nasal spray as needed  . Shortness of breath dyspnea    with exertion    Family History  Problem Relation Age of Onset  . Bronchitis Father 70  . Alzheimer's disease Mother   . Diabetes Daughter   . Diabetes Daughter   . Diabetes Unknown   . Colon cancer Neg Hx   . Esophageal cancer Neg Hx   . Rectal cancer Neg Hx   . Stomach cancer Neg Hx     Past Surgical History:  Procedure Laterality Date  . ABDOMINAL HYSTERECTOMY    . INJECTION KNEE  04/09/2012   Procedure: KNEE INJECTION;  Surgeon: Johnny Bridge, MD;  Location: [facility];  Service: Orthopedics;  Laterality: Left;  . JOINT REPLACEMENT     acl   bil knees  . KNEE ARTHROTOMY Left 08/28/2015   Procedure: LEFT KNEE ARTHROFIBROSIS EXCISION; pylectomy;  Surgeon: Meredith Pel, MD;  Location: [facility];  Service: Orthopedics;  Laterality: Left;  . KNEE CLOSED REDUCTION  04/09/2012   Procedure: CLOSED MANIPULATION KNEE;  Surgeon: Johnny Bridge, MD;  Location: [facility];  Service: Orthopedics;  Laterality: Left;  Manipulation Knee with Anesthesia includes Application of Traction   . TOTAL KNEE ARTHROPLASTY  02/23/2012    Procedure: TOTAL KNEE ARTHROPLASTY;  Surgeon: Johnny Bridge, MD;  Location: [facility];  Service: Orthopedics;  Laterality: Left;   Social History   Occupational History  . Not on file.   Social History Main Topics  . Smoking status: Never Smoker  . Smokeless tobacco: Never Used  . Alcohol use No  . Drug use: No  . Sexual activity: Yes    Birth control/ protection: Surgical

## 2016-12-28 ENCOUNTER — Other Ambulatory Visit: Payer: Self-pay | Admitting: Internal Medicine

## 2017-01-01 ENCOUNTER — Ambulatory Visit (INDEPENDENT_AMBULATORY_CARE_PROVIDER_SITE_OTHER): Payer: Medicare Other | Admitting: Orthopedic Surgery

## 2017-01-01 DIAGNOSIS — M2022 Hallux rigidus, left foot: Secondary | ICD-10-CM | POA: Diagnosis not present

## 2017-01-01 NOTE — Progress Notes (Signed)
Office Visit Note   Patient: Tabitha Oneal           Date of Birth: 05-20-1956           MRN: [id number] Visit Date: 01/01/2017              Requested by: Gildardo Cranker, [address] [place], [place] [address] PCP: Gildardo Cranker, DO  No chief complaint on file.     HPI: Patient is a 61 year old woman who is seen in referral for bony spurs and hallux rigidus left great toe MTP joint. Patient complains of pain with walking.  Assessment & Plan: Visit Diagnoses:  1. Hallux rigidus, left foot     Plan: Patient is given instructions for heel cord stretching as well as a stiff soled walking shoe such since the balance walking sneaker. Recommended she avoid sandals or other soft flexible shoes. She will follow up as needed. Discussed if she fails conservative care week. Evaluate for removal of the bony spurs with cheilectomy.  Follow-Up Instructions: Return if symptoms worsen or fail to improve.   Ortho Exam  Patient is alert, oriented, no adenopathy, well-dressed, normal affect, normal respiratory effort. Examination patient has difficulty getting from a sitting to a standing position. She has a good dorsalis pedis pulse. There is no venous stasis ulcers no plantar ulcers. She does have bony spurs dorsally over the MTP joint of the left great toe. She has dorsiflexion of 40 which is painful plantar flexion of 20 is painful she is tender to palpation dorsally over the bony spurs. There is no tophaceous gouty deposits no redness no signs of gout. Patient has moderate hallux rigidus.  Imaging: No results found.  Labs: Lab Results  Component Value Date   HGBA1C 6.3 (H) 07/31/2016   HGBA1C 6.2 (H) 02/20/2016   HGBA1C 6.4 (H) 11/21/2015   LABURIC 4.0 01/03/2015   REPTSTATUS 08/13/2015 FINAL 08/10/2015   CULT >=100,000 COLONIES/mL ESCHERICHIA COLI 08/10/2015   LABORGA ESCHERICHIA COLI 08/10/2015    Orders:  No orders of the defined types were placed in this  encounter.  No orders of the defined types were placed in this encounter.    Procedures: No procedures performed  Clinical Data: No additional findings.  ROS:  All other systems negative, except as noted in the HPI. Review of Systems  Objective: Vital Signs: There were no vitals taken for this visit.  Specialty Comments:  No specialty comments available.  PMFS History: Patient Active Problem List   Diagnosis Date Noted  . Hallux rigidus, left foot 01/01/2017  . Postmenopausal symptoms 02/22/2016  . Arthrofibrosis of knee joint 08/28/2015  . Left hip pain 10/08/2014  . Type 2 diabetes mellitus without complication ([facility]) 99/37/1696  . Hyperlipidemia LDL goal <100 10/08/2014  . Essential hypertension, benign 08/30/2014  . Diabetes mellitus, type 2 ([facility]) 08/30/2014  . Diverticulosis of colon without hemorrhage 08/30/2014  . Chronic low back pain 08/30/2014  . Chronic pain syndrome 08/30/2014  . Asthma in adult without complication 78/93/8101  . 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 Osteoarthritis of left knee 02/23/2012   Past Medical History:  Diagnosis Date  . Asthma    QVAR daily and Albuterol as needed  . Bladder infection    taking Keflex daily   . Chronic back pain    reason unknown  . Diabetes mellitus    takes Metformin and Tradjenta daily  . GERD (gastroesophageal reflux disease)    takes Omeprazole  daily  . Hard of hearing   . Headache    daily.Takes Excedrine daily  . History of blood transfusion    no abnormal reaction noted  . Hyperlipidemia    takes Atorvastatin daily  . Hypertension    takes Quinapril daily  . Joint pain   . Joint swelling   . Nocturia   . Osteoarthritis of left knee 02/23/2012  . Seasonal allergies    takes Claritin and Singulair daily.Nasal spray as needed  . Shortness of breath dyspnea    with exertion    Family History  Problem Relation Age of Onset  . Bronchitis Father 26  .  Alzheimer's disease Mother   . Diabetes Daughter   . Diabetes Daughter   . Diabetes Unknown   . Colon cancer Neg Hx   . Esophageal cancer Neg Hx   . Rectal cancer Neg Hx   . Stomach cancer Neg Hx     Past Surgical History:  Procedure Laterality Date  . ABDOMINAL HYSTERECTOMY    . INJECTION KNEE  04/09/2012   Procedure: KNEE INJECTION;  Surgeon: Johnny Bridge, MD;  Location: [facility];  Service: Orthopedics;  Laterality: Left;  . JOINT REPLACEMENT     acl   bil knees  . KNEE ARTHROTOMY Left 08/28/2015   Procedure: LEFT KNEE ARTHROFIBROSIS EXCISION; pylectomy;  Surgeon: Meredith Pel, MD;  Location: [facility];  Service: Orthopedics;  Laterality: Left;  . KNEE CLOSED REDUCTION  04/09/2012   Procedure: CLOSED MANIPULATION KNEE;  Surgeon: Johnny Bridge, MD;  Location: [facility];  Service: Orthopedics;  Laterality: Left;  Manipulation Knee with Anesthesia includes Application of Traction   . TOTAL KNEE ARTHROPLASTY  02/23/2012   Procedure: TOTAL KNEE ARTHROPLASTY;  Surgeon: Johnny Bridge, MD;  Location: [facility];  Service: Orthopedics;  Laterality: Left;   Social History   Occupational History  . Not on file.   Social History Main Topics  . Smoking status: Never Smoker  . Smokeless tobacco: Never Used  . Alcohol use No  . Drug use: No  . Sexual activity: Yes    Birth control/ protection: Surgical

## 2017-01-08 ENCOUNTER — Encounter: Payer: Self-pay | Admitting: Internal Medicine

## 2017-01-28 ENCOUNTER — Other Ambulatory Visit: Payer: Self-pay | Admitting: Allergy & Immunology

## 2017-01-28 ENCOUNTER — Ambulatory Visit (INDEPENDENT_AMBULATORY_CARE_PROVIDER_SITE_OTHER): Payer: Medicare Other

## 2017-01-28 DIAGNOSIS — Z23 Encounter for immunization: Secondary | ICD-10-CM | POA: Diagnosis not present

## 2017-01-28 DIAGNOSIS — Z0000 Encounter for general adult medical examination without abnormal findings: Secondary | ICD-10-CM

## 2017-01-28 DIAGNOSIS — Z135 Encounter for screening for eye and ear disorders: Secondary | ICD-10-CM

## 2017-01-28 NOTE — Patient Instructions (Addendum)
Ms. Prestage , Thank you for taking time to come for your Medicare Wellness Visit. I appreciate your ongoing commitment to your health goals. Please review the following plan we discussed and let me know if I can assist you in the future.   Screening recommendations/referrals: Colonoscopy up to date. Due 02/03/2026 Mammogram up to date. Due 12/06/2018 Bone Density up to date Recommended yearly ophthalmology/optometry visit for glaucoma screening and checkup Recommended yearly dental visit for hygiene and checkup  Vaccinations: Influenza vaccine given Pneumococcal vaccine up to date. Due 07/31/2017 Tdap vaccine due, if you decide you want it let us know and we will send over prescription to your pharmacy Shingles vaccine due at age 55  Advanced directives: Advance directive discussed with you today. I have provided a copy for you to complete at home and have notarized. Once this is complete please bring a copy in to our office so we can scan it into your chart.   Conditions/risks identified: None  Next appointment: 02/04/17 Dr. Eulas Post @ 11:30 Rich Reining, RN 02/11/18 @ 10:45am  Preventive Care 40-64 Years, Female Preventive care refers to lifestyle choices and visits with your health care provider that can promote health and wellness. What does preventive care include?  A yearly physical exam. This is also called an annual well check.  Dental exams once or twice a year.  Routine eye exams. Ask your health care provider how often you should have your eyes checked.  Personal lifestyle choices, including:  Daily care of your teeth and gums.  Regular physical activity.  Eating a healthy diet.  Avoiding tobacco and drug use.  Limiting alcohol use.  Practicing safe sex.  Taking low-dose aspirin daily starting at age 52.  Taking vitamin and mineral supplements as recommended by your health care provider. What happens during an annual well check? The services and screenings  done by your health care provider during your annual well check will depend on your age, overall health, lifestyle risk factors, and family history of disease. Counseling  Your health care provider may ask you questions about your:  Alcohol use.  Tobacco use.  Drug use.  Emotional well-being.  Home and relationship well-being.  Sexual activity.  Eating habits.  Work and work Statistician.  Method of birth control.  Menstrual cycle.  Pregnancy history. Screening  You may have the following tests or measurements:  Height, weight, and BMI.  Blood pressure.  Lipid and cholesterol levels. These may be checked every 5 years, or more frequently if you are over 51 years old.  Skin check.  Lung cancer screening. You may have this screening every year starting at age 92 if you have a 30-pack-year history of smoking and currently smoke or have quit within the past 15 years.  Fecal occult blood test (FOBT) of the stool. You may have this test every year starting at age 44.  Flexible sigmoidoscopy or colonoscopy. You may have a sigmoidoscopy every 5 years or a colonoscopy every 10 years starting at age 98.  Hepatitis C blood test.  Hepatitis B blood test.  Sexually transmitted disease (STD) testing.  Diabetes screening. This is done by checking your blood sugar (glucose) after you have not eaten for a while (fasting). You may have this done every 1-3 years.  Mammogram. This may be done every 1-2 years. Talk to your health care provider about when you should start having regular mammograms. This may depend on whether you have a family history of breast cancer.  BRCA-related  cancer screening. This may be done if you have a family history of breast, ovarian, tubal, or peritoneal cancers.  Pelvic exam and Pap test. This may be done every 3 years starting at age 21. Starting at age 59, this may be done every 5 years if you have a Pap test in combination with an HPV test.  Bone  density scan. This is done to screen for osteoporosis. You may have this scan if you are at high risk for osteoporosis. Discuss your test results, treatment options, and if necessary, the need for more tests with your health care provider. Vaccines  Your health care provider may recommend certain vaccines, such as:  Influenza vaccine. This is recommended every year.  Tetanus, diphtheria, and acellular pertussis (Tdap, Td) vaccine. You may need a Td booster every 10 years.  Zoster vaccine. You may need this after age 41.  Pneumococcal 13-valent conjugate (PCV13) vaccine. You may need this if you have certain conditions and were not previously vaccinated.  Pneumococcal polysaccharide (PPSV23) vaccine. You may need one or two doses if you smoke cigarettes or if you have certain conditions. Talk to your health care provider about which screenings and vaccines you need and how often you need them. This information is not intended to replace advice given to you by your health care provider. Make sure you discuss any questions you have with your health care provider. Document Released: 06/08/2015 Document Revised: 01/30/2016 Document Reviewed: 03/13/2015 Elsevier Interactive Patient Education  2017 [employer] Prevention in the Home Falls can cause injuries. They can happen to people of all ages. There are many things you can do to make your home safe and to help prevent falls. What can I do on the outside of my home?  Regularly fix the edges of walkways and driveways and fix any cracks.  Remove anything that might make you trip as you walk through a door, such as a raised step or threshold.  Trim any bushes or trees on the path to your home.  Use bright outdoor lighting.  Clear any walking paths of anything that might make someone trip, such as rocks or tools.  Regularly check to see if handrails are loose or broken. Make sure that both sides of any steps have  handrails.  Any raised decks and porches should have guardrails on the edges.  Have any leaves, snow, or ice cleared regularly.  Use sand or salt on walking paths during winter.  Clean up any spills in your garage right away. This includes oil or grease spills. What can I do in the bathroom?  Use night lights.  Install grab bars by the toilet and in the tub and shower. Do not use towel bars as grab bars.  Use non-skid mats or decals in the tub or shower.  If you need to sit down in the shower, use a plastic, non-slip stool.  Keep the floor dry. Clean up any water that spills on the floor as soon as it happens.  Remove soap buildup in the tub or shower regularly.  Attach bath mats securely with double-sided non-slip rug tape.  Do not have throw rugs and other things on the floor that can make you trip. What can I do in the bedroom?  Use night lights.  Make sure that you have a light by your bed that is easy to reach.  Do not use any sheets or blankets that are too big for your bed. They should  not hang down onto the floor.  Have a firm chair that has side arms. You can use this for support while you get dressed.  Do not have throw rugs and other things on the floor that can make you trip. What can I do in the kitchen?  Clean up any spills right away.  Avoid walking on wet floors.  Keep items that you use a lot in easy-to-reach places.  If you need to reach something above you, use a strong step stool that has a grab bar.  Keep electrical cords out of the way.  Do not use floor polish or wax that makes floors slippery. If you must use wax, use non-skid floor wax.  Do not have throw rugs and other things on the floor that can make you trip. What can I do with my stairs?  Do not leave any items on the stairs.  Make sure that there are handrails on both sides of the stairs and use them. Fix handrails that are broken or loose. Make sure that handrails are as long as  the stairways.  Check any carpeting to make sure that it is firmly attached to the stairs. Fix any carpet that is loose or worn.  Avoid having throw rugs at the top or bottom of the stairs. If you do have throw rugs, attach them to the floor with carpet tape.  Make sure that you have a light switch at the top of the stairs and the bottom of the stairs. If you do not have them, ask someone to add them for you. What else can I do to help prevent falls?  Wear shoes that:  Do not have high heels.  Have rubber bottoms.  Are comfortable and fit you well.  Are closed at the toe. Do not wear sandals.  If you use a stepladder:  Make sure that it is fully opened. Do not climb a closed stepladder.  Make sure that both sides of the stepladder are locked into place.  Ask someone to hold it for you, if possible.  Clearly mark and make sure that you can see:  Any grab bars or handrails.  First and last steps.  Where the edge of each step is.  Use tools that help you move around (mobility aids) if they are needed. These include:  Canes.  Walkers.  Scooters.  Crutches.  Turn on the lights when you go into a dark area. Replace any light bulbs as soon as they burn out.  Set up your furniture so you have a clear path. Avoid moving your furniture around.  If any of your floors are uneven, fix them.  If there are any pets around you, be aware of where they are.  Review your medicines with your doctor. Some medicines can make you feel dizzy. This can increase your chance of falling. Ask your doctor what other things that you can do to help prevent falls. This information is not intended to replace advice given to you by your health care provider. Make sure you discuss any questions you have with your health care provider. Document Released: 03/08/2009 Document Revised: 10/18/2015 Document Reviewed: 06/16/2014 Elsevier Interactive Patient Education  2017 Elsevier Inc.    

## 2017-01-28 NOTE — Progress Notes (Signed)
Subjective:   Tabitha Oneal is a 61 y.o. female who presents for Medicare Annual (Subsequent) preventive examination.  Last AWV-02/22/2016     Objective:     Vitals: There were no vitals taken for this visit.  There is no height or weight on file to calculate BMI.   Tobacco History  Smoking Status  . Never Smoker  Smokeless Tobacco  . Never Used     Counseling given: Not Answered   Past Medical History:  Diagnosis Date  . Asthma    QVAR daily and Albuterol as needed  . Bladder infection    taking Keflex daily   . Chronic back pain    reason unknown  . Diabetes mellitus    takes Metformin and Tradjenta daily  . GERD (gastroesophageal reflux disease)    takes Omeprazole daily  . Hard of hearing   . Headache    daily.Takes Excedrine daily  . History of blood transfusion    no abnormal reaction noted  . Hyperlipidemia    takes Atorvastatin daily  . Hypertension    takes Quinapril daily  . Joint pain   . Joint swelling   . Nocturia   . Osteoarthritis of left knee 02/23/2012  . Seasonal allergies    takes Claritin and Singulair daily.Nasal spray as needed  . Shortness of breath dyspnea    with exertion   Past Surgical History:  Procedure Laterality Date  . ABDOMINAL HYSTERECTOMY    . INJECTION KNEE  04/09/2012   Procedure: KNEE INJECTION;  Surgeon: Johnny Bridge, MD;  Location: [facility];  Service: Orthopedics;  Laterality: Left;  . JOINT REPLACEMENT     acl   bil knees  . KNEE ARTHROTOMY Left 08/28/2015   Procedure: LEFT KNEE ARTHROFIBROSIS EXCISION; pylectomy;  Surgeon: Meredith Pel, MD;  Location: [facility];  Service: Orthopedics;  Laterality: Left;  . KNEE CLOSED REDUCTION  04/09/2012   Procedure: CLOSED MANIPULATION KNEE;  Surgeon: Johnny Bridge, MD;  Location: [facility];  Service: Orthopedics;  Laterality: Left;  Manipulation Knee with Anesthesia includes Application of Traction   . TOTAL KNEE ARTHROPLASTY   02/23/2012   Procedure: TOTAL KNEE ARTHROPLASTY;  Surgeon: Johnny Bridge, MD;  Location: [facility];  Service: Orthopedics;  Laterality: Left;   Family History  Problem Relation Age of Onset  . Bronchitis Father 95  . Alzheimer's disease Mother   . Diabetes Daughter   . Diabetes Daughter   . Diabetes Unknown   . Colon cancer Neg Hx   . Esophageal cancer Neg Hx   . Rectal cancer Neg Hx   . Stomach cancer Neg Hx    History  Sexual Activity  . Sexual activity: Yes  . Birth control/ protection: Surgical    Outpatient Encounter Prescriptions as of 01/28/2017  Medication Sig  . Albuterol Sulfate (PROAIR RESPICLICK) 053 (90 Base) MCG/ACT AEPB Inhale 4 puffs into the lungs every 4 (four) hours as needed.  . AMITIZA 24 MCG capsule TAKE 1 CAPSULE BY MOUTH TWICE A DAY WITH A MEAL  . aspirin-acetaminophen-caffeine (EXCEDRIN MIGRAINE) 250-250-65 MG per tablet Take 2 tablets by mouth daily.  Marland Kitchen atorvastatin (LIPITOR) 40 MG tablet TAKE 1 TABLET EVERY DAY  . azelastine (ASTELIN) 0.1 % nasal spray USE 1 SPRAY EACH NOSTRIL TWICE DAILY  . baclofen (LIORESAL) 10 MG tablet Take 10 mg by mouth. 1 in morning 2 at bed time  . BREO ELLIPTA 100-25 MCG/INH AEPB INHALE 1 PUFF BY  MOUTH INTO THE LUNGS DAILY.  Marland Kitchen diclofenac sodium (VOLTAREN) 1 % GEL Apply 8 g topically 2 (two) times daily as needed. On knees  . loratadine (CLARITIN) 10 MG tablet APPOINTMENT OVERDUE 1 tablet by mouth daily  . metFORMIN (GLUCOPHAGE) 500 MG tablet Take one tablet by mouth twice daily to control blood sugar  . montelukast (SINGULAIR) 10 MG tablet Take 1 tablet (10 mg total) by mouth at bedtime.  Marland Kitchen omeprazole (PRILOSEC) 40 MG capsule TAKE 1 CAPSULE BY MOUTH EVERY DAY  . ONE TOUCH ULTRA TEST test strip Check blood sugar once daily as directed  . Oxycodone HCl 10 MG TABS Take 1 tablet (10 mg total) by mouth 4 (four) times daily as needed.  . quinapril (ACCUPRIL) 20 MG tablet TAKE 1 TABLET BY MOUTH AT BEDTIME  . TRADJENTA 5 MG TABS tablet  TAKE 1 TABLET (5 MG TOTAL) BY MOUTH DAILY.   No facility-administered encounter medications on file as of 01/28/2017.     Activities of Daily Living In your present state of health, do you have any difficulty performing the following activities: 02/22/2016  Hearing? N  Vision? N  Difficulty concentrating or making decisions? N  Walking or climbing stairs? Y  Dressing or bathing? N  Doing errands, shopping? N  Some recent data might be hidden    Patient Care Team: Gildardo Cranker, DO as PCP - General (Internal Medicine)    Assessment:     Exercise Activities and Dietary recommendations    Goals    None     Fall Risk Fall Risk  07/31/2016 02/22/2016 11/23/2015 08/22/2015 04/13/2015  Falls in the past year? No No Yes No Yes  Number falls in past yr: - - 1 - 1  Injury with Fall? - - No - No   Depression Screen PHQ 2/9 Scores 02/22/2016 08/22/2015 08/30/2014  PHQ - 2 Score 0 0 0     Cognitive Function: within normal limits        Immunization History  Administered Date(s) Administered  . Influenza Split 02/24/2012  . Influenza,inj,Quad PF,6+ Mos 04/13/2015, 02/22/2016  . Pneumococcal Polysaccharide-23 07/31/2016   Screening Tests Health Maintenance  Topic Date Due  . HIV Screening  12/17/1969  . TETANUS/TDAP  12/17/1973  . OPHTHALMOLOGY EXAM  09/04/2015  . INFLUENZA VACCINE  12/24/2016  . HEMOGLOBIN A1C  01/31/2017  . FOOT EXAM  02/21/2017  . PAP SMEAR  10/05/2017  . MAMMOGRAM  12/05/2018  . PNEUMOCOCCAL POLYSACCHARIDE VACCINE (2) 07/31/2021  . COLONOSCOPY  02/03/2026  . Hepatitis C Screening  Completed      Plan:    I have personally reviewed and addressed the Medicare Annual Wellness questionnaire and have noted the following in the patient's chart:  A. Medical and social history B. Use of alcohol, tobacco or illicit drugs  C. Current medications and supplements D. Functional ability and status E.  Nutritional status F.  Physical activity G. Advance  directives H. List of other physicians I.  Hospitalizations, surgeries, and ER visits in previous 12 months J.  [place] to include hearing, vision, cognitive, depression L. Referrals and appointments - none  In addition, I have reviewed and discussed with patient certain preventive protocols, quality metrics, and best practice recommendations. A written personalized care plan for preventive services as well as general preventive health recommendations were provided to patient.  See attached scanned questionnaire for additional information.   Signed,   Rich Reining, RN Nurse Health Advisor   Quick Notes  Health Maintenance: Flu vaccine given. Eye appointment referral put in. HIV screen due     Abnormal Screen: No MMSE pt under age 67. BP 160/70 (pt has not had BP medication yet today)     Patient Concerns: none     Nurse Concerns: none

## 2017-01-29 ENCOUNTER — Ambulatory Visit (INDEPENDENT_AMBULATORY_CARE_PROVIDER_SITE_OTHER): Payer: Medicare Other | Admitting: Allergy & Immunology

## 2017-01-29 ENCOUNTER — Encounter: Payer: Self-pay | Admitting: Allergy & Immunology

## 2017-01-29 VITALS — BP 118/82 | HR 80 | Resp 16

## 2017-01-29 DIAGNOSIS — J302 Other seasonal allergic rhinitis: Secondary | ICD-10-CM | POA: Diagnosis not present

## 2017-01-29 DIAGNOSIS — J4540 Moderate persistent asthma, uncomplicated: Secondary | ICD-10-CM

## 2017-01-29 DIAGNOSIS — J3089 Other allergic rhinitis: Secondary | ICD-10-CM

## 2017-01-29 MED ORDER — FLUTICASONE FUROATE-VILANTEROL 200-25 MCG/INH IN AEPB
1.0000 | INHALATION_SPRAY | Freq: Every day | RESPIRATORY_TRACT | 2 refills | Status: DC
Start: 2017-01-29 — End: 2017-04-22

## 2017-01-29 MED ORDER — FLUTICASONE PROPIONATE 50 MCG/ACT NA SUSP: 2.0000 | Freq: Every day | NASAL | 5 refills | Status: DC

## 2017-01-29 NOTE — Patient Instructions (Addendum)
1. Other seasonal allergic rhinitis - Continue with Astelin 1-2 sprays 1-2 times daily. - Continue with Singulair 10mg  daily. - Add Flonase two sprays per nostril daily to help with nasal congestion.   2. Moderate persistent asthma, uncomplicated - Lung testing was normal, but since you are using your albuterol so often, we will increase the dose of Breo to the 200/25 dose.  - Daily controller medication(s): Breo 200/25 one puff once daily - Rescue medications: ProAir RespiClick four puffs every 4-6 hours as needed - Asthma control goals:  * Full participation in all desired activities (may need albuterol before activity) * Albuterol use two time or less a week on average (not counting use with activity) * Cough interfering with sleep two time or less a month * Oral steroids no more than once a year * No hospitalizations  3. Return in about 3 months (around 04/30/2017).  Please inform us of any Emergency Department visits, hospitalizations, or changes in symptoms. Call us before going to the ED for breathing or allergy symptoms since we might be able to fit you in for a sick visit. Feel free to contact us anytime with any questions, problems, or concerns.  It was a pleasure to see you again today! Enjoy the upcoming fall season!  Websites that have reliable patient information: 1. American Academy of Asthma, Allergy, and Immunology: www.aaaai.org 2. Food Allergy Research and Education (FARE): foodallergy.org 3. Mothers of Asthmatics: http://www.asthmacommunitynetwork.org 4. American College of Allergy, Asthma, and Immunology: www.acaai.org   Election Day is coming up on Tuesday, November 6th! Make your voice heard! Register to vote at [email or web address]!

## 2017-01-29 NOTE — Progress Notes (Signed)
FOLLOW UP  Date of Service/Encounter:  01/29/17   Assessment:   Moderate persistent asthma, uncomplicated  Seasonal and perennial allergic rhinitis (trees, mold, dust mite)   Asthma Reportables:  Severity: moderate persistent  Risk: high due to comorbidities Control: well controlled  Seasonal Influenza Vaccine: yes   Plan/Recommendations:   1. Seasonal allergic rhinitis - Continue with Astelin 1-2 sprays 1-2 times daily. - Continue with Singulair 10mg  daily. - Add Flonase two sprays per nostril daily to help with nasal congestion.   2. Moderate persistent asthma, uncomplicated - Lung testing was normal, but since you are using your albuterol so often, we will increase the dose of Breo to the 200/25 dose.  - Daily controller medication(s): Breo 200/25 one puff once daily - Rescue medications: ProAir RespiClick four puffs every 4-6 hours as needed - Asthma control goals:  * Full participation in all desired activities (may need albuterol before activity) * Albuterol use two time or less a week on average (not counting use with activity) * Cough interfering with sleep two time or less a month * Oral steroids no more than once a year * No hospitalizations  3. Return in about 3 months (around 04/30/2017).  Subjective:   Tabitha Oneal is a 61 y.o. female presenting today for follow up of  Chief Complaint  Patient presents with  . Asthma  . Cough    sneezing, nasal congestion     Tabitha Oneal has a history of the following: Patient Active Problem List   Diagnosis Date Noted  . Hallux rigidus, left foot 01/01/2017  . Postmenopausal symptoms 02/22/2016  . Arthrofibrosis of knee joint 08/28/2015  . Left hip pain 10/08/2014  . Type 2 diabetes mellitus without complication ([place]) 24/26/8341  . Hyperlipidemia LDL goal <100 10/08/2014  . Essential hypertension, benign 08/30/2014  . Diabetes mellitus, type 2 ([place]) 08/30/2014  . Diverticulosis of colon  without hemorrhage 08/30/2014  . Chronic low back pain 08/30/2014  . Chronic pain syndrome 08/30/2014  . Asthma in adult without complication 96/22/2979  . 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 Osteoarthritis of left knee 02/23/2012    History obtained from: chart review and patient.  Tabitha Oneal's Primary Care Provider is Gildardo Cranker, DO.     Tabitha Oneal is a 61 y.o. female presenting for a follow up visit. Patient was last seen in March 2018. At that time, her lung testing did show evidence of some obstructive disease, we changed her to Breo 100/25 one puff once daily as well as Premarin as needed. Prior to this, she was on Qvar. She has a history of allergic rhinitis and we recommended continuing with the past 1-2 sprays per nostril up to twice daily as well as Singulair 10 mg daily.  Since the last visit, she has mostly done well. However, she had a knee replacement one year ago and has had complications from the procedure. In fact, she has required multiple surgeries and continues to have a brace on her left leg. She has been with a cane the entire time.   From a breathing perspective, she has done well. She has been short of breath since the last visit. She is on the [place] and feels that this is working better than the Qvar alone. She does have daily SOB and tries to use albuterol as needed, estimates about twice weekly. However, upon further questioning, it seems that her shortness of breath is more likely related to uncontrolled  rhinitis. Otherwise, Tabitha Oneal's asthma has been well controlled. She has not required rescue medication, experienced nocturnal awakenings due to lower respiratory symptoms, nor have activities of daily living been limited. She has required no Emergency Department or Urgent Care visits for her asthma. She has required zero courses of systemic steroids for asthma exacerbations since the last visit. ACT score today is 17, indicating  subpar asthma symptom control.   She does endorse sneezing and coughing at this time. She remains on the Astelin two sprays per nostril daily. She is also on Singulair. Her last skin testing was performed in 2002 and was positive to trees, mold mix #4, and dust mites on intradermal.    She does have GERD and is on omeprazole. She has type 2 diabetes. Her last hemoglobin A1c was 6.3 in March 2018. Otherwise, there have been no changes to her past medical history, surgical history, family history, or social history.    Review of Systems: a 14-point review of systems is pertinent for what is mentioned in HPI.  Otherwise, all other systems were negative. Constitutional: negative other than that listed in the HPI Eyes: negative other than that listed in the HPI Ears, nose, mouth, throat, and face: negative other than that listed in the HPI Respiratory: negative other than that listed in the HPI Cardiovascular: negative other than that listed in the HPI Gastrointestinal: negative other than that listed in the HPI Genitourinary: negative other than that listed in the HPI Integument: negative other than that listed in the HPI Hematologic: negative other than that listed in the HPI Musculoskeletal: negative other than that listed in the HPI Neurological: negative other than that listed in the HPI Allergy/Immunologic: negative other than that listed in the HPI    Objective:   Blood pressure 118/82, pulse 80, resp. rate 16. There is no height or weight on file to calculate BMI.   Physical Exam:  General: Alert, interactive, in no acute distress. Eyes: No conjunctival injection present on the right, No conjunctival injection present on the left, PERRL bilaterally, No discharge on the right, No discharge on the left, No Horner-Trantas dots present and allergic shiners present bilaterally Ears: Right TM pearly gray with normal light reflex, Left TM pearly gray with normal light reflex, Right TM  intact without perforation and Left TM intact without perforation.  Nose/Throat: External nose within normal limits and septum midline, turbinates edematous and pale with clear discharge, post-pharynx erythematous with cobblestoning in the posterior oropharynx. Tonsils 2+ without exudates Neck: Supple without thyromegaly. Lungs: Decreased breath sounds bilaterally without wheezing, rhonchi or rales. No increased work of breathing. Dry hacking cough during the visit. CV: Normal S1/S2, no murmurs. Capillary refill <2 seconds.  Skin: Warm and dry, without lesions or rashes. Neuro:   Grossly intact. No focal deficits appreciated. Responsive to questions.   Diagnostic studies:   Spirometry: results normal (FEV1: 1.74/89%, FVC: 2.48/102%, FEV1/FVC: 70%).    Spirometry consistent with normal pattern. Albuterol/Atrovent nebulizer treatment given in clinic with no improvement. She did have a 10% improvement in the FEV1 and a 590% improvement in the FEF25-75%.   Allergy Studies: none     Salvatore Marvel, MD [facility]

## 2017-01-30 DIAGNOSIS — G894 Chronic pain syndrome: Secondary | ICD-10-CM | POA: Diagnosis not present

## 2017-01-30 DIAGNOSIS — M545 Low back pain: Secondary | ICD-10-CM | POA: Diagnosis not present

## 2017-01-30 DIAGNOSIS — Z79891 Long term (current) use of opiate analgesic: Secondary | ICD-10-CM | POA: Diagnosis not present

## 2017-01-30 DIAGNOSIS — M25562 Pain in left knee: Secondary | ICD-10-CM | POA: Diagnosis not present

## 2017-02-02 ENCOUNTER — Ambulatory Visit: Payer: Medicare Other

## 2017-02-02 ENCOUNTER — Ambulatory Visit: Payer: Medicare Other | Admitting: Nurse Practitioner

## 2017-02-04 ENCOUNTER — Ambulatory Visit: Payer: Medicare Other | Admitting: Nurse Practitioner

## 2017-02-04 ENCOUNTER — Ambulatory Visit (INDEPENDENT_AMBULATORY_CARE_PROVIDER_SITE_OTHER): Payer: Medicare Other | Admitting: Internal Medicine

## 2017-02-04 ENCOUNTER — Encounter: Payer: Self-pay | Admitting: Internal Medicine

## 2017-02-04 VITALS — BP 132/84 | HR 65 | Temp 98.3°F | Ht 65.0 in | Wt 209.6 lb

## 2017-02-04 DIAGNOSIS — E785 Hyperlipidemia, unspecified: Secondary | ICD-10-CM

## 2017-02-04 DIAGNOSIS — K5909 Other constipation: Secondary | ICD-10-CM | POA: Diagnosis not present

## 2017-02-04 DIAGNOSIS — J4540 Moderate persistent asthma, uncomplicated: Secondary | ICD-10-CM

## 2017-02-04 DIAGNOSIS — H539 Unspecified visual disturbance: Secondary | ICD-10-CM

## 2017-02-04 DIAGNOSIS — E1169 Type 2 diabetes mellitus with other specified complication: Secondary | ICD-10-CM | POA: Diagnosis not present

## 2017-02-04 DIAGNOSIS — E119 Type 2 diabetes mellitus without complications: Secondary | ICD-10-CM | POA: Diagnosis not present

## 2017-02-04 DIAGNOSIS — I10 Essential (primary) hypertension: Secondary | ICD-10-CM

## 2017-02-04 MED ORDER — ZOSTER VAC RECOMB ADJUVANTED 50 MCG/0.5ML IM SUSR: 0.5000 mL | Freq: Once | INTRAMUSCULAR | 1 refills | Status: DC

## 2017-02-04 MED ORDER — ZOSTER VAC RECOMB ADJUVANTED 50 MCG/0.5ML IM SUSR: 0.5000 mL | Freq: Once | INTRAMUSCULAR | 1 refills | Status: AC

## 2017-02-04 MED ORDER — TETANUS-DIPHTH-ACELL PERTUSSIS 5-2.5-18.5 LF-MCG/0.5 IM SUSP: 0.5000 mL | Freq: Once | INTRAMUSCULAR | 1 refills | Status: AC

## 2017-02-04 NOTE — Progress Notes (Signed)
Patient ID: Tabitha Oneal, female   DOB: 1955/09/05, 61 y.o.   MRN: [id number]    Location:  [facility] Place of Service: OFFICE  Chief Complaint  Patient presents with  . Medical Management of Chronic Issues    6 month follow-up, + fall risk, stomach pains x 2 weeks (constipation), waeks up with HA daily,  and night sweats x long time   . Medication Refill    No refills needed   . Immunizations    Flu vaccine up to date, rx for shingrix and TDaP printed   . Health Maintenance    Refused HIV Screening, patient would like referral to eye doctor (order placed by Sara/RN)     HPI:  61 yo female seen today for f/u. She reports episodes of diarrhea about 2 weeks ago but has since resolved. She had abdominal pain and cramping with constipation following the diarrhea. She had a normal BM yesterday. No f/c. Occasional nausea but no emesis.  She had AWV on 9/5/8. Note reviewed.  She continues to have reduced OS vision. Referral placed for ophthamology on 01/28/17.  DM - BS usually 130-160. No low BS reactions. Controlled overall. She takes metformin and tradjenta. No low BS reactions. Occasional tingling in left foot. A1c 6.3%. Random urine microalbumin 15.8. She takes ACEI and statin  HTN - stable on quinapril  Hyperlipidemia - she has LUE "pressure" but no myalgias on lipitor. LDL 87.   Asthma, moderate persistent, uncomplicated - controlled. No flares. followed by asthma specialist Dr Ernst Bowler. She takes singulair, Qvar, prn albuterol HFA and astelin spray.   OA/chronic pain - she has intermittent left knee pain despite having knee surgery in April 2017. No plans to repeat sx due to c/a risk of infection. She is followed by Ortho Dr Nicki Reaper. She wears left knee brace daily but has difficulty walking and uses a cane to ambulate. Completed PT. Exercise tolerance low due to pain. Followed by pain specialist [facility] ([address]). She takes oxycodone TID, baclofen and meloxicam. Alk phos  141    Past Medical History:  Diagnosis Date  . Asthma    QVAR daily and Albuterol as needed  . Bladder infection    taking Keflex daily   . Chronic back pain    reason unknown  . Diabetes mellitus    takes Metformin and Tradjenta daily  . GERD (gastroesophageal reflux disease)    takes Omeprazole daily  . Hard of hearing   . Headache    daily.Takes Excedrine daily  . History of blood transfusion    no abnormal reaction noted  . Hyperlipidemia    takes Atorvastatin daily  . Hypertension    takes Quinapril daily  . Joint pain   . Joint swelling   . Nocturia   . Osteoarthritis of left knee 02/23/2012  . Seasonal allergies    takes Claritin and Singulair daily.Nasal spray as needed  . Shortness of breath dyspnea    with exertion    Past Surgical History:  Procedure Laterality Date  . ABDOMINAL HYSTERECTOMY    . INJECTION KNEE  04/09/2012   Procedure: KNEE INJECTION;  Surgeon: Johnny Bridge, MD;  Location: [facility];  Service: Orthopedics;  Laterality: Left;  . JOINT REPLACEMENT     acl   bil knees  . KNEE ARTHROTOMY Left 08/28/2015   Procedure: LEFT KNEE ARTHROFIBROSIS EXCISION; pylectomy;  Surgeon: Meredith Pel, MD;  Location: [facility];  Service: Orthopedics;  Laterality: Left;  .  KNEE CLOSED REDUCTION  04/09/2012   Procedure: CLOSED MANIPULATION KNEE;  Surgeon: Johnny Bridge, MD;  Location: [facility];  Service: Orthopedics;  Laterality: Left;  Manipulation Knee with Anesthesia includes Application of Traction   . TOTAL KNEE ARTHROPLASTY  02/23/2012   Procedure: TOTAL KNEE ARTHROPLASTY;  Surgeon: Johnny Bridge, MD;  Location: [facility];  Service: Orthopedics;  Laterality: Left;    Patient Care Team: Gildardo Cranker, DO as PCP - General (Internal Medicine)  Social History   Social History  . Marital status: Widowed    Spouse name: N/A  . Number of children: N/A  . Years of education: N/A   Occupational History  . Not on file.    Social History Main Topics  . Smoking status: Never Smoker  . Smokeless tobacco: Never Used  . Alcohol use No  . Drug use: No  . Sexual activity: Yes    Birth control/ protection: Surgical   Other Topics Concern  . Not on file   Social History Narrative   Diet: none   Do you drink/eat things with caffeine ? Coffee    Material status: widow     What year were you married? 08/01/1978   Do you live in a house, apartment, assisted living,condo, trailer,ect.)? Townhouse (temporary)    Is it one or more stories? Yes   How many persons live in your home? Two   Do you have any pets in your home ? Yes, Shih-tzu   Current or past profession: none   Do you exercise? No  Type & how often: no   Do you have a living will ? No   Do you have a DNR form? No   If not, do you want to discuss one?  Not now   Do you have signed POA /HPOA forms? No    If so, please bring to your appointment.                                      reports that she has never smoked. She has never used smokeless tobacco. She reports that she does not drink alcohol or use drugs.  Family History  Problem Relation Age of Onset  . Bronchitis Father 14  . Alzheimer's disease Mother   . Diabetes Daughter   . Diabetes Daughter   . Diabetes Unknown   . Colon cancer Neg Hx   . Esophageal cancer Neg Hx   . Rectal cancer Neg Hx   . Stomach cancer Neg Hx    Family Status  Relation Status  . Father Deceased at age 19  . Mother Alive  . Sister Alive  . Sister Alive  . Sister Alive  . Brother Alive  . Sister Alive  . Sister Alive  . Sister Alive  . Son Alive  . Daughter Alive  . Daughter Alive  . Daughter Alive  . Daughter (Not Specified)  . Daughter (Not Specified)  . Unknown (Not Specified)  . Neg Hx (Not Specified)     No Known Allergies  Medications: Patient's Medications  New Prescriptions   No medications on file  Previous Medications   ALBUTEROL SULFATE (PROAIR RESPICLICK) 160 (90 BASE)  MCG/ACT AEPB    Inhale 4 puffs into the lungs every 4 (four) hours as needed.   AMITIZA 24 MCG CAPSULE    TAKE 1 CAPSULE BY MOUTH TWICE A DAY WITH  A MEAL   ASPIRIN-ACETAMINOPHEN-CAFFEINE (EXCEDRIN MIGRAINE) 250-250-65 MG PER TABLET    Take 2 tablets by mouth daily.   ATORVASTATIN (LIPITOR) 40 MG TABLET    TAKE 1 TABLET EVERY DAY   AZELASTINE (ASTELIN) 0.1 % NASAL SPRAY    USE 1 SPRAY EACH NOSTRIL TWICE DAILY   BACLOFEN (LIORESAL) 10 MG TABLET    Take 10 mg by mouth. 1 in morning 2 at bed time   DICLOFENAC SODIUM (VOLTAREN) 1 % GEL    Apply 8 g topically 2 (two) times daily as needed. On knees   FLUTICASONE (FLONASE) 50 MCG/ACT NASAL SPRAY    Place 2 sprays into both nostrils daily.   FLUTICASONE FUROATE-VILANTEROL (BREO ELLIPTA) 200-25 MCG/INH AEPB    Inhale 1 puff into the lungs daily.   METFORMIN (GLUCOPHAGE) 500 MG TABLET    Take one tablet by mouth twice daily to control blood sugar   MONTELUKAST (SINGULAIR) 10 MG TABLET    Take 1 tablet (10 mg total) by mouth at bedtime.   OMEPRAZOLE (PRILOSEC) 40 MG CAPSULE    TAKE 1 CAPSULE BY MOUTH EVERY DAY   ONE TOUCH ULTRA TEST TEST STRIP    Check blood sugar once daily as directed   OXYCODONE HCL 10 MG TABS    Take 1 tablet (10 mg total) by mouth 4 (four) times daily as needed.   QUINAPRIL (ACCUPRIL) 20 MG TABLET    TAKE 1 TABLET BY MOUTH AT BEDTIME   TRADJENTA 5 MG TABS TABLET    TAKE 1 TABLET (5 MG TOTAL) BY MOUTH DAILY.  Modified Medications   Modified Medication Previous Medication   TDAP (BOOSTRIX) 5-2.5-18.5 LF-MCG/0.5 INJECTION Tdap (BOOSTRIX) 5-2.5-18.5 LF-MCG/0.5 injection      Inject 0.5 mLs into the muscle once.    Inject 0.5 mLs into the muscle once.   ZOSTER VAC RECOMB ADJUVANTED (SHINGRIX) INJECTION Zoster Vac Recomb Adjuvanted (SHINGRIX) injection      Inject 0.5 mLs into the muscle once.    Inject 0.5 mLs into the muscle once.  Discontinued Medications   LORATADINE (CLARITIN) 10 MG TABLET    APPOINTMENT OVERDUE 1 tablet by mouth  daily    Review of Systems  Eyes: Positive for visual disturbance.  Gastrointestinal: Positive for abdominal pain and constipation.  Musculoskeletal: Positive for arthralgias and gait problem. Negative for myalgias.  Allergic/Immunologic: Positive for environmental allergies.  All other systems reviewed and are negative.   Vitals:   02/04/17 1137  BP: 132/84  Pulse: 65  Temp: 98.3 F (36.8 C)  TempSrc: Oral  SpO2: 96%  Weight: 209 lb 9.6 oz (95.1 kg)  Height: '5\' 5"'$  (1.651 m)   Body mass index is 34.88 kg/m.  Physical Exam  Constitutional: She is oriented to person, place, and time. She appears well-developed and well-nourished.  HENT:  Mouth/Throat: Oropharynx is clear and moist. No oropharyngeal exudate.  MMM; no oral thrush  Eyes: Pupils are equal, round, and reactive to light. No scleral icterus.  Neck: Neck supple. Carotid bruit is present (b/l systolic). No tracheal deviation present. No thyromegaly present.  Cardiovascular: Normal rate, regular rhythm and intact distal pulses.  Exam reveals no gallop and no friction rub.   Murmur (1/6 SEM -->carotid b/l) heard. +1 pitting LE edema L>R; no calf TTP  Pulmonary/Chest: Effort normal and breath sounds normal. No stridor. No respiratory distress. She has no wheezes. She has no rales.  Abdominal: Soft. Normal appearance and bowel sounds are normal. She exhibits no distension and no  mass. There is no hepatomegaly. There is no tenderness. There is no rigidity, no rebound and no guarding. No hernia.  Musculoskeletal: She exhibits edema (left knee) and tenderness.  Left knee immobilizer intact  Lymphadenopathy:    She has no cervical adenopathy.  Neurological: She is alert and oriented to person, place, and time. She has normal reflexes.  Skin: Skin is warm and dry. No rash noted.  Psychiatric: She has a normal mood and affect. Her behavior is normal. Judgment and thought content normal.   Diabetic Foot Exam - Simple   Simple  Foot Form Diabetic Foot exam was performed with the following findings:  Yes 02/04/2017 12:29 PM  Visual Inspection See comments:  Yes Sensation Testing Intact to touch and monofilament testing bilaterally:  Yes Pulse Check Posterior Tibialis and Dorsalis pulse intact bilaterally:  Yes Comments B/l bunion but no calluses/ulcerations      Labs reviewed: No visits with results within 3 Month(s) from this visit.  Latest known visit with results is:  Admission on 10/04/2016, Discharged on 10/04/2016  Component Date Value Ref Range Status  . Glucose-Capillary 10/04/2016 120* 65 - 99 mg/dL Final  . Comment 1 10/04/2016 Notify RN   Final  . Comment 2 10/04/2016 Document in Chart   Final    No results found.   Assessment/Plan   ICD-10-CM   1. Chronic constipation - stable K59.09   2. Type 2 diabetes mellitus without complication, without long-term current use of insulin (HCC) E11.9 CMP with eGFR    Hemoglobin A1c    Microalbumin/Creatinine Ratio, Urine    Urinalysis with Reflex Microscopic  3. Essential hypertension, benign I10 Urinalysis with Reflex Microscopic  4. Moderate persistent asthma in adult without complication Z30.86   5. Hyperlipidemia due to type 2 diabetes mellitus (HCC) E11.69 Lipid Panel   E78.5 TSH  6. Change in vision H53.9    left   Continue current medications as ordered  Will call with eye provider referral  Will call with lab results  Follow up with specialists as scheduled  Follow up in 6 mos for DM, HTN, hyperlipidemia  Tabitha Oneal  [facility] [address] [place], [place] [address] [phone] Cell (Monday-Friday 8 AM - 5 PM) [phone] After 5 PM and follow prompts

## 2017-02-04 NOTE — Patient Instructions (Addendum)
Continue current medications as ordered  Will call with eye provider referral  Will call with lab results  Follow up with specialists as scheduled  Follow up in 6 mos for DM, HTN, hyperlipidemia

## 2017-02-05 LAB — COMPLETE METABOLIC PANEL WITH GFR
AG Ratio: 1.5 (calc) (ref 1.0–2.5)
ALT: 11 U/L (ref 6–29)
AST: 14 U/L (ref 10–35)
Albumin: 4.1 g/dL (ref 3.6–5.1)
Alkaline phosphatase (APISO): 124 U/L (ref 33–130)
BUN: 9 mg/dL (ref 7–25)
CALCIUM: 9.3 mg/dL (ref 8.6–10.4)
CO2: 31 mmol/L (ref 20–32)
CREATININE: 0.78 mg/dL (ref 0.50–0.99)
Chloride: 106 mmol/L (ref 98–110)
GFR, EST AFRICAN AMERICAN: 94 mL/min/{1.73_m2} (ref >=60)
GFR, EST NON AFRICAN AMERICAN: 81 mL/min/{1.73_m2} (ref >=60)
Globulin: 2.7 g/dL (calc) (ref 1.9–3.7)
Glucose, Bld: 112 mg/dL — ABNORMAL HIGH (ref 65–99)
Potassium: 4.2 mmol/L (ref 3.5–5.3)
Sodium: 142 mmol/L (ref 135–146)
TOTAL PROTEIN: 6.8 g/dL (ref 6.1–8.1)
Total Bilirubin: 0.3 mg/dL (ref 0.2–1.2)

## 2017-02-05 LAB — HEMOGLOBIN A1C
EAG (MMOL/L): 7.4 (calc)
Hgb A1c MFr Bld: 6.3 % of total Hgb — ABNORMAL HIGH (ref <5.7)
Mean Plasma Glucose: 134 (calc)

## 2017-02-05 LAB — URINALYSIS, ROUTINE W REFLEX MICROSCOPIC
BILIRUBIN URINE: NEGATIVE
Glucose, UA: NEGATIVE
Hgb urine dipstick: NEGATIVE
KETONES UR: NEGATIVE
Leukocytes, UA: NEGATIVE
Nitrite: NEGATIVE
Protein, ur: NEGATIVE
SPECIFIC GRAVITY, URINE: 1.03 (ref 1.001–1.03)
pH: 5.5 (ref 5.0–8.0)

## 2017-02-05 LAB — LIPID PANEL
CHOLESTEROL: 129 mg/dL (ref <200)
HDL: 51 mg/dL (ref >50)
LDL Cholesterol (Calc): 63 mg/dL (calc)
NON-HDL CHOLESTEROL (CALC): 78 mg/dL (ref <130)
Total CHOL/HDL Ratio: 2.5 (calc) (ref <5.0)
Triglycerides: 70 mg/dL (ref <150)

## 2017-02-05 LAB — TSH: TSH: 0.71 m[IU]/L (ref 0.40–4.50)

## 2017-02-05 LAB — URINE CULTURE
MICRO NUMBER:: 81006509
RESULT: NO GROWTH
SPECIMEN QUALITY:: ADEQUATE

## 2017-02-05 LAB — MICROALBUMIN / CREATININE URINE RATIO
CREATININE, URINE: 249 mg/dL (ref 20–275)
MICROALB/CREAT RATIO: 12 ug/mg{creat} (ref <30)
Microalb, Ur: 3 mg/dL

## 2017-02-07 ENCOUNTER — Other Ambulatory Visit: Payer: Self-pay | Admitting: Allergy & Immunology

## 2017-02-12 ENCOUNTER — Other Ambulatory Visit: Payer: Self-pay | Admitting: Internal Medicine

## 2017-02-19 ENCOUNTER — Other Ambulatory Visit: Payer: Self-pay | Admitting: Internal Medicine

## 2017-02-27 DIAGNOSIS — Z79891 Long term (current) use of opiate analgesic: Secondary | ICD-10-CM | POA: Diagnosis not present

## 2017-02-27 DIAGNOSIS — M545 Low back pain: Secondary | ICD-10-CM | POA: Diagnosis not present

## 2017-02-27 DIAGNOSIS — M25562 Pain in left knee: Secondary | ICD-10-CM | POA: Diagnosis not present

## 2017-02-27 DIAGNOSIS — G894 Chronic pain syndrome: Secondary | ICD-10-CM | POA: Diagnosis not present

## 2017-03-18 ENCOUNTER — Other Ambulatory Visit: Payer: Self-pay | Admitting: Internal Medicine

## 2017-03-22 ENCOUNTER — Other Ambulatory Visit: Payer: Self-pay | Admitting: Allergy & Immunology

## 2017-03-25 ENCOUNTER — Other Ambulatory Visit: Payer: Self-pay | Admitting: Internal Medicine

## 2017-04-22 ENCOUNTER — Other Ambulatory Visit: Payer: Self-pay | Admitting: Allergy & Immunology

## 2017-04-28 DIAGNOSIS — M25562 Pain in left knee: Secondary | ICD-10-CM | POA: Diagnosis not present

## 2017-04-28 DIAGNOSIS — M545 Low back pain: Secondary | ICD-10-CM | POA: Diagnosis not present

## 2017-04-28 DIAGNOSIS — G894 Chronic pain syndrome: Secondary | ICD-10-CM | POA: Diagnosis not present

## 2017-05-02 ENCOUNTER — Other Ambulatory Visit: Payer: Self-pay | Admitting: Internal Medicine

## 2017-05-10 ENCOUNTER — Other Ambulatory Visit: Payer: Self-pay | Admitting: Internal Medicine

## 2017-06-04 ENCOUNTER — Ambulatory Visit: Payer: Self-pay | Admitting: Allergy & Immunology

## 2017-06-04 LAB — HM DIABETES EYE EXAM

## 2017-06-08 ENCOUNTER — Ambulatory Visit (INDEPENDENT_AMBULATORY_CARE_PROVIDER_SITE_OTHER): Payer: Medicare HMO | Admitting: Family Medicine

## 2017-06-08 ENCOUNTER — Encounter: Payer: Self-pay | Admitting: Family Medicine

## 2017-06-08 VITALS — BP 120/70 | HR 75 | Temp 98.4°F | Resp 16

## 2017-06-08 DIAGNOSIS — R059 Cough, unspecified: Secondary | ICD-10-CM

## 2017-06-08 DIAGNOSIS — E119 Type 2 diabetes mellitus without complications: Secondary | ICD-10-CM

## 2017-06-08 DIAGNOSIS — R05 Cough: Secondary | ICD-10-CM | POA: Diagnosis not present

## 2017-06-08 DIAGNOSIS — J4540 Moderate persistent asthma, uncomplicated: Secondary | ICD-10-CM | POA: Insufficient documentation

## 2017-06-08 MED ORDER — IPRATROPIUM BROMIDE 0.03 % NA SOLN: 2.0000 | Freq: Two times a day (BID) | NASAL | 5 refills | Status: DC

## 2017-06-08 MED ORDER — GUAIFENESIN ER 600 MG PO TB12: 600.0000 mg | ORAL_TABLET | Freq: Two times a day (BID) | ORAL | 0 refills | Status: AC

## 2017-06-08 NOTE — Patient Instructions (Addendum)
1. Other seasonal allergic rhinitis - Continue with Astelin 1-2 sprays 1-2 times daily. - Continue with Singulair 10mg  daily. - Add Flonase two sprays per nostril daily to help with nasal congestion. - Over the counter antihistamine as needed for a runny nose.   2. Moderate persistent asthma, uncomplicated -    - Daily controller medication(s): Breo 200/25 one puff once daily - Rescue medications: ProAir RespiClick four puffs every 4-6 hours as needed - Asthma control goals:  * Full participation in all desired activities (may need albuterol before activity) * Albuterol use two time or less a week on average (not counting use with activity) * Cough interfering with sleep two time or less a month * Oral steroids no more than once a year * No hospitalizations 3. Cough - Add Atrovent nasal spray 2 sprays in each nostril 2 times a day as needed for nasal symptoms.  - Begin Mucinex [phone] mg every 12 hours as needed for cough - Continue omeprazole - Increase fluid intake as tolerated   3. Follow up in 2 weeks

## 2017-06-08 NOTE — Progress Notes (Signed)
[place] [address] Dept: [phone]  FAMILY NURSE PRACTITIONER FOLLOW UP NOTE  Patient ID: Tabitha Oneal, female    DOB: 1955-08-27  Age: 62 y.o. MRN: [id number] Date of Office Visit: 06/08/2017  Assessment  Chief Complaint: Cough (coughing up yellow and green sputum for about 2 weeks)  HPI Tabitha Oneal is a 62 year old female who presents to the clinic today for a sick visit.  She was last seen in this clinic on 01/29/2017 by Dr. Ernst Bowler for evaluation of seasonal allergic rhinitis and moderate persistent asthma uncomplicated. She was continued on Breo Ellipta 200-25. Montelukast, Flonase, Astelin, and omeprazole.   At today's visit, she reports a constellation of symptoms which all began about 3 weeks ago including cough which is now productive with yellow/clear/green sputum, sore throat, and chest tightness. This is accompanied by wheezing while lying down at night and occasional shortness of breath with activity. She is currently using Breo Ellipta 200 one puff once a day, montelukast 10 mg, and albuterol inhaler every 4 hours with some relief. She reports she has not had a fever nor had she been exposed to any sick contacts.   Allergic rhinitis is reported as not well controlled with nasal congestion combined with a runny nose. She reports feeling a thick drainage in her throat and has had an increase in throat clearing. She is currently using Flonase nasal spray and Astelin nasal spray. She reports she has not had adequate fluid intake lately.   Headaches are reported as daily for which she takes Excedrin with relief.   She reports daily FSBS montioring with sugars reported as 120-180. She takes Metformin and Tradjenta daily.     Drug Allergies:  No Known Allergies  Physical Exam: BP 120/70 (BP Location: Left Arm, Cuff Size: Normal)   Pulse 75   Temp 98.4 F (36.9 C) (Oral)   Resp 16   SpO2 96%    Physical Exam  Constitutional: She  appears well-developed and well-nourished.  HENT:  Right Ear: External ear normal.  Left Ear: External ear normal.  Ears normal. Eyes normal. Bilateral nares erythematous and edematous with clear drainage noted. Pharynx erythematous with no exudate noted.   Eyes: Conjunctivae are normal.  Neck: Normal range of motion. Neck supple.  Pulmonary/Chest: Effort normal and breath sounds normal.  Lungs clear to auscultation.    Diagnostics: FVC 2.57, FEV1 1.99.  Predicted FVC 2.71, predicted FEV1 2.13.  Spirometry is within the normal range.  No significant change post bronchodilator therapy FVC 2.57, FEV1 1.90.  Spirometry is within the normal range with no significant change postbronchodilator therapy.  Assessment and Plan: 1. Cough   2. Moderate persistent asthma, uncomplicated     Meds ordered this encounter  Medications  . ipratropium (ATROVENT) 0.03 % nasal spray    Sig: Place 2 sprays into both nostrils 2 (two) times daily.    Dispense:  30 mL    Refill:  5  . guaiFENesin (MUCINEX) 600 MG 12 hr tablet    Sig: Take 1 tablet (600 mg total) by mouth 2 (two) times daily for 30 doses.    Dispense:  30 tablet    Refill:  0     Patient Instructions  1. Other seasonal allergic rhinitis - Continue with Astelin 1-2 sprays 1-2 times daily. - Continue with Singulair 10mg  daily. - Add Flonase two sprays per nostril daily to help with nasal congestion. - Over the counter antihistamine as needed for a runny  nose.   2. Moderate persistent asthma, uncomplicated -    - Daily controller medication(s): Breo 200/25 one puff once daily - Rescue medications: ProAir RespiClick four puffs every 4-6 hours as needed - Asthma control goals:  * Full participation in all desired activities (may need albuterol before activity) * Albuterol use two time or less a week on average (not counting use with activity) * Cough interfering with sleep two time or less a month * Oral steroids no more than once a  year * No hospitalizations 3. Cough - Add Atrovent nasal spray 2 sprays in each nostril 2 times a day as needed for nasal symptoms.  - Begin Mucinex [phone] mg every 12 hours as needed for cough - Continue omeprazole - Increase fluid intake as tolerated   3. Follow up in 2 weeks or sooner if her symptoms persist or progress or if she becomes febrile.    Return in about 2 weeks (around 06/22/2017).    Thank you for the opportunity to care for this patient.  Please do not hesitate to contact me with questions.  Gareth Morgan, FNP [facility] [place] [facility]   I have provided oversight concerning [facility]'s evaluation and treatment of this patient's health issues addressed during today's encounter.  I agree with the assessment and therapeutic plan as outlined in the note.   Signed,   R Edgar Frisk, MD

## 2017-06-10 ENCOUNTER — Telehealth: Payer: Self-pay | Admitting: *Deleted

## 2017-06-10 NOTE — Telephone Encounter (Signed)
Patient called stating that she is having nosebleeds with new medications that were sent in on Monday 06/08/17. Advised patient ok to stop Atrovent and use it as needed not on a schedule. advsised to keep using the Mucinex but make sure she drinks plenty of water. Patient verbalized understanding.

## 2017-06-15 ENCOUNTER — Ambulatory Visit: Payer: Medicare Other | Admitting: Family Medicine

## 2017-06-17 ENCOUNTER — Encounter (INDEPENDENT_AMBULATORY_CARE_PROVIDER_SITE_OTHER): Payer: Self-pay | Admitting: Orthopedic Surgery

## 2017-06-17 ENCOUNTER — Ambulatory Visit (INDEPENDENT_AMBULATORY_CARE_PROVIDER_SITE_OTHER): Payer: Medicare HMO

## 2017-06-17 ENCOUNTER — Ambulatory Visit (INDEPENDENT_AMBULATORY_CARE_PROVIDER_SITE_OTHER): Payer: Medicare HMO | Admitting: Orthopedic Surgery

## 2017-06-17 DIAGNOSIS — M25561 Pain in right knee: Secondary | ICD-10-CM

## 2017-06-17 DIAGNOSIS — G8929 Other chronic pain: Secondary | ICD-10-CM

## 2017-06-17 NOTE — Progress Notes (Signed)
Office Visit Note   Patient: Tabitha Oneal           Date of Birth: Oct 17, 1955           MRN: [id number] Visit Date: 06/17/2017 Requested by: Gildardo Cranker, [place],  [id number] PCP: Gildardo Cranker, DO  Subjective: Chief Complaint  Patient presents with  . Right Knee - Pain    HPI: Tabitha Oneal is a patient with right knee pain.  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  Currently she now reports right knee pain.  Reports stiffness and swelling and difficulty walking.  She is using a cane.  She does not work.  Denies any fevers or chills.              ROS: All systems reviewed are negative as they relate to the chief complaint within the history of present illness.  Patient denies  fevers or chills.   Assessment & Plan: Visit Diagnoses:  1. Chronic pain of right knee     Plan: Impression is right knee pain.  Radiographs look pretty reasonable.  Plan is injection in the knee today.  Would want to try to avoid surgery in this right knee if possible based on her results with the left well seated and well aligned total knee replacement.  Follow-Up Instructions: No Follow-up on file.   Orders:  Orders Placed This Encounter  Procedures  . XR KNEE 3 VIEW RIGHT   No orders of the defined types were placed in this encounter.     Procedures: Large Joint Inj: R knee on 06/17/2017 1:38 PM Indications: diagnostic evaluation, joint swelling and pain Details: 18 G 1.5 in needle, superolateral approach  Arthrogram: No  Medications: 4 mL bupivacaine 0.25 %; 5 mL lidocaine 1 %; 40 mg methylPREDNISolone acetate 40 MG/ML Outcome: tolerated well, no immediate complications Procedure, treatment alternatives, risks and benefits explained, specific risks discussed. Consent was given by the patient. Immediately prior to procedure a time out was called to verify the correct patient, procedure, equipment, support staff and site/side  marked as required. Patient was prepped and draped in the usual sterile fashion.       Clinical Data: No additional findings.  Objective: Vital Signs: There were no vitals taken for this visit.  Physical Exam:   Constitutional: Patient appears well-developed HEENT:  Head: Normocephalic Eyes:EOM are normal Neck: Normal range of motion Cardiovascular: Normal rate Pulmonary/chest: Effort normal Neurologic: Patient is alert Skin: Skin is warm Psychiatric: Patient has normal mood and affect    Ortho Exam: Orthopedic examination on the right knee demonstrates no effusion stable collateral cruciate ligaments intact extensor mechanism palpable pedal pulses no masses lymphadenopathy or skin changes noted in the right knee region.  There is a little bit of medial and lateral joint line tenderness.  Specialty Comments:  No specialty comments available.  Imaging: Xr Knee 3 View Right  Result Date: 06/17/2017 AP lateral and merchant view of the right knee reviewed.  Medial compartment arthritis is present with mild joint space narrowing in all 3 compartments.  Arthritis is worse in the patellofemoral compartment.  Slight varus alignment is present.  No acute fracture is seen.  Left total knee prosthesis on the coronal view in good position and alignment.    PMFS History: Patient Active Problem List   Diagnosis Date Noted  . Cough 06/08/2017  . Moderate persistent asthma, uncomplicated 70/62/3762  . Chronic constipation 02/04/2017  . Hyperlipidemia due  to type 2 diabetes mellitus ([place]) 02/04/2017  . Hallux rigidus, left foot 01/01/2017  . Postmenopausal symptoms 02/22/2016  . Arthrofibrosis of knee joint 08/28/2015  . Left hip pain 10/08/2014  . Type 2 diabetes mellitus without complication, without long-term current use of insulin ([place]) 10/08/2014  . Hyperlipidemia LDL goal <100 10/08/2014  . Essential hypertension, benign 08/30/2014  . Diabetes mellitus, type 2 ([place])  08/30/2014  . Diverticulosis of colon without hemorrhage 08/30/2014  . Chronic low back pain 08/30/2014  . Chronic pain syndrome 08/30/2014  . Asthma in adult without complication 16/02/9603  . 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 Osteoarthritis of left knee 02/23/2012   Past Medical History:  Diagnosis Date  . Asthma    QVAR daily and Albuterol as needed  . Bladder infection    taking Keflex daily   . Chronic back pain    reason unknown  . Diabetes mellitus    takes Metformin and Tradjenta daily  . GERD (gastroesophageal reflux disease)    takes Omeprazole daily  . Hard of hearing   . Headache    daily.Takes Excedrine daily  . History of blood transfusion    no abnormal reaction noted  . Hyperlipidemia    takes Atorvastatin daily  . Hypertension    takes Quinapril daily  . Joint pain   . Joint swelling   . Nocturia   . Osteoarthritis of left knee 02/23/2012  . Seasonal allergies    takes Claritin and Singulair daily.Nasal spray as needed  . Shortness of breath dyspnea    with exertion    Family History  Problem Relation Age of Onset  . Bronchitis Father 43  . Alzheimer's disease Mother   . Diabetes Daughter   . Diabetes Daughter   . Diabetes Unknown   . Colon cancer Neg Hx   . Esophageal cancer Neg Hx   . Rectal cancer Neg Hx   . Stomach cancer Neg Hx     Past Surgical History:  Procedure Laterality Date  . ABDOMINAL HYSTERECTOMY    . INJECTION KNEE  04/09/2012   Procedure: KNEE INJECTION;  Surgeon: Johnny Bridge, MD;  Location: [facility];  Service: Orthopedics;  Laterality: Left;  . JOINT REPLACEMENT     acl   bil knees  . KNEE ARTHROTOMY Left 08/28/2015   Procedure: LEFT KNEE ARTHROFIBROSIS EXCISION; pylectomy;  Surgeon: Meredith Pel, MD;  Location: [facility];  Service: Orthopedics;  Laterality: Left;  . KNEE CLOSED REDUCTION  04/09/2012   Procedure: CLOSED MANIPULATION KNEE;  Surgeon: Johnny Bridge, MD;   Location: [facility];  Service: Orthopedics;  Laterality: Left;  Manipulation Knee with Anesthesia includes Application of Traction   . TOTAL KNEE ARTHROPLASTY  02/23/2012   Procedure: TOTAL KNEE ARTHROPLASTY;  Surgeon: Johnny Bridge, MD;  Location: [facility];  Service: Orthopedics;  Laterality: Left;   Social History   Occupational History  . Not on file  Tobacco Use  . Smoking status: Never Smoker  . Smokeless tobacco: Never Used  Substance and Sexual Activity  . Alcohol use: No    Alcohol/week: 0.0 oz  . Drug use: No  . Sexual activity: Yes    Birth control/protection: Surgical

## 2017-06-18 ENCOUNTER — Other Ambulatory Visit: Payer: Self-pay | Admitting: Allergy & Immunology

## 2017-06-18 ENCOUNTER — Ambulatory Visit: Payer: Medicare Other | Admitting: Allergy & Immunology

## 2017-06-18 DIAGNOSIS — J309 Allergic rhinitis, unspecified: Secondary | ICD-10-CM

## 2017-06-20 MED ORDER — LIDOCAINE HCL 1 % IJ SOLN
5.0000 mL | INTRAMUSCULAR | Status: AC | PRN
  Administered 2017-06-17: 5 mL

## 2017-06-20 MED ORDER — BUPIVACAINE HCL 0.25 % IJ SOLN
4.0000 mL | INTRAMUSCULAR | Status: AC | PRN
  Administered 2017-06-17: 4 mL via INTRA_ARTICULAR

## 2017-06-20 MED ORDER — METHYLPREDNISOLONE ACETATE 40 MG/ML IJ SUSP
40.0000 mg | INTRAMUSCULAR | Status: AC | PRN
  Administered 2017-06-17: 40 mg via INTRA_ARTICULAR

## 2017-07-08 ENCOUNTER — Other Ambulatory Visit: Payer: Self-pay | Admitting: Allergy & Immunology

## 2017-07-15 ENCOUNTER — Other Ambulatory Visit: Payer: Self-pay | Admitting: Allergy & Immunology

## 2017-07-24 ENCOUNTER — Telehealth: Payer: Self-pay | Admitting: *Deleted

## 2017-07-24 NOTE — Telephone Encounter (Signed)
.  left message to have patient return my call.  

## 2017-07-24 NOTE — Telephone Encounter (Signed)
Patient calling asking if she can get a letter stating that her dog can live with her. Per pt her husband died 4 years ago and she's not over it and want her dog as a companion to live with her. Please advise

## 2017-07-24 NOTE — Telephone Encounter (Signed)
She will need to get a companion dog for that. I am not sure a letter from me will justify her dog living with her

## 2017-07-27 NOTE — Telephone Encounter (Signed)
Spoke with patient and advised results   

## 2017-07-27 NOTE — Telephone Encounter (Signed)
Patient called with a status update, patient has service dog registration and immunization records. Patient will bring information to pending appointment on 08/04/17 to have Dr.Carter complete letter

## 2017-07-29 ENCOUNTER — Other Ambulatory Visit: Payer: Self-pay | Admitting: Allergy & Immunology

## 2017-07-30 ENCOUNTER — Ambulatory Visit (INDEPENDENT_AMBULATORY_CARE_PROVIDER_SITE_OTHER): Payer: Medicare HMO | Admitting: Allergy & Immunology

## 2017-07-30 ENCOUNTER — Telehealth: Payer: Self-pay | Admitting: Internal Medicine

## 2017-07-30 ENCOUNTER — Encounter: Payer: Self-pay | Admitting: Allergy & Immunology

## 2017-07-30 VITALS — BP 128/72 | HR 80 | Resp 16

## 2017-07-30 DIAGNOSIS — B9789 Other viral agents as the cause of diseases classified elsewhere: Secondary | ICD-10-CM | POA: Diagnosis not present

## 2017-07-30 DIAGNOSIS — J302 Other seasonal allergic rhinitis: Secondary | ICD-10-CM | POA: Diagnosis not present

## 2017-07-30 DIAGNOSIS — J4540 Moderate persistent asthma, uncomplicated: Secondary | ICD-10-CM

## 2017-07-30 DIAGNOSIS — J0100 Acute maxillary sinusitis, unspecified: Secondary | ICD-10-CM | POA: Diagnosis not present

## 2017-07-30 DIAGNOSIS — J3089 Other allergic rhinitis: Secondary | ICD-10-CM | POA: Diagnosis not present

## 2017-07-30 DIAGNOSIS — J069 Acute upper respiratory infection, unspecified: Secondary | ICD-10-CM | POA: Diagnosis not present

## 2017-07-30 DIAGNOSIS — B999 Unspecified infectious disease: Secondary | ICD-10-CM | POA: Diagnosis not present

## 2017-07-30 MED ORDER — BENZONATATE 100 MG PO CAPS: 100.0000 mg | ORAL_CAPSULE | Freq: Three times a day (TID) | ORAL | 0 refills | Status: DC | PRN

## 2017-07-30 NOTE — Patient Instructions (Addendum)
1. Moderate persistent asthma, uncomplicated - Lung testing looked normal and stable today. - We will not make any medication changes at this time. - We will get some blood work to see if you would qualify for an injectable asthma medication. - Spacer sample and demonstration provided as well as nebulizer.  - Daily controller medication(s): Breo 200/28mcg one puff once daily - Prior to physical activity: ProAir 2 puffs 10-15 minutes before physical activity. - Rescue medications: ProAir 4 puffs every 4-6 hours as needed or albuterol nebulizer one vial every 4-6 hours as needed - Asthma control goals:  * Full participation in all desired activities (may need albuterol before activity) * Albuterol use two time or less a week on average (not counting use with activity) * Cough interfering with sleep two time or less a month * Oral steroids no more than once a year * No hospitalizations  2. Seasonal and perennial allergic rhinitis - We will get some labs to see if there are any new allergens. - Continue with fluticasone nasal spray two sprays per nostril daily. - Continue with Singulair 10mg  daily.  3. Acute non-recurrent maxillary sinusitis - With your current symptoms and time course, antibiotics are not needed . - If symptoms are not improving in 3-4 days, feel free to call us or email me, at [email or web address] and we can send in an antibiotic at that time.  - Add on nasal saline spray (i.e., Simply Saline) or nasal saline lavage (i.e., NeilMed) as needed prior to medicated nasal sprays. - For thick post nasal drainage, add guaifenesin [phone] mg (Mucinex) twice daily as needed for mucous thinning with adequate hydration to help it work.  - Start the prednisone pack provided today to help control inflammation.  - Start Tessalon pearls three times daily as needed for coughing.   4. Recurrent infections - We will get some labs to look at your immune system. - We will call you  in 1-2 weeks with the results.  5. Return in about 3 months (around 10/30/2017).   Please inform us of any Emergency Department visits, hospitalizations, or changes in symptoms. Call us before going to the ED for breathing or allergy symptoms since we might be able to fit you in for a sick visit. Feel free to contact us anytime with any questions, problems, or concerns.  It was a pleasure to see you again today!  Websites that have reliable patient information: 1. American Academy of Asthma, Allergy, and Immunology: www.aaaai.org 2. Food Allergy Research and Education (FARE): foodallergy.org 3. Mothers of Asthmatics: http://www.asthmacommunitynetwork.org 4. American College of Allergy, Asthma, and Immunology: www.acaai.org

## 2017-07-30 NOTE — Progress Notes (Addendum)
FOLLOW UP  Date of Service/Encounter:  07/30/17   Assessment:   Moderate persistent asthma, uncomplicated  Seasonal and perennial allergic rhinitis (trees, molds, dust mites)  Acute non-recurrent maxillary sinusitis - likely viral with the two day time course  Recurrent infections    Asthma Reportables:  Severity: moderate persistent  Risk: high Control: not well controlled  Plan/Recommendations:   1. Moderate persistent asthma, uncomplicated - Lung testing looked normal and stable today. - We will not make any medication changes at this time. - We will get some blood work to see if you would qualify for an injectable asthma medication. - Spacer sample and demonstration provided as well as nebulizer.  - Daily controller medication(s): Breo 200/21mcg one puff once daily - Prior to physical activity: ProAir 2 puffs 10-15 minutes before physical activity. - Rescue medications: ProAir 4 puffs every 4-6 hours as needed or albuterol nebulizer one vial every 4-6 hours as needed - Asthma control goals:  * Full participation in all desired activities (may need albuterol before activity) * Albuterol use two time or less a week on average (not counting use with activity) * Cough interfering with sleep two time or less a month * Oral steroids no more than once a year * No hospitalizations  2. Seasonal and perennial allergic rhinitis (trees, molds, dust mites) - We will get some labs to see if there are any new allergens. - Continue with fluticasone nasal spray two sprays per nostril daily. - Continue with Singulair 10mg  daily.  3. Acute non-recurrent maxillary sinusitis - With your current symptoms and time course, antibiotics are not needed . - If symptoms are not improving in 3-4 days, feel free to call us or email me, at [email or web address] and we can send in an antibiotic at that time.  - Add on nasal saline spray (i.e., Simply Saline) or nasal saline lavage  (i.e., NeilMed) as needed prior to medicated nasal sprays. - For thick post nasal drainage, add guaifenesin [phone] mg (Mucinex) twice daily as needed for mucous thinning with adequate hydration to help it work.  - Start the prednisone pack provided today to help control inflammation.  - Start Tessalon pearls three times daily as needed for coughing.   4. Recurrent infections - She has a history of recurrent infections, in particular sinus infections. - We will obtain some screening labs to evaluate her immune system.  - Labs to evaluate the quantitative aspects of her immune system: IgG/IgA/IgM, CBC with differential - Labs to evaluate the qualitative aspects of her immune system: CH50, Pneumococcal titers, Tetanus titers, Diphtheria titers - We may consider immunizations with Pneumovax and Tdap to challenge her immune system, and then obtain repeat titers in 4-6 weeks.  - We may consider obtaining flow cytometry at future visits, if indicated.   5. Return in about 3 months (around 10/30/2017).  Subjective:   Tabitha Oneal is a 62 y.o. female presenting today for follow up of  Chief Complaint  Patient presents with  . Asthma  . Cough  . Nasal Congestion    Tabitha Oneal has a history of the following: Patient Active Problem List   Diagnosis Date Noted  . Cough 06/08/2017  . Moderate persistent asthma, uncomplicated 41/63/8453  . Chronic constipation 02/04/2017  . Hyperlipidemia due to type 2 diabetes mellitus ([place]) 02/04/2017  . Hallux rigidus, left foot 01/01/2017  . Postmenopausal symptoms 02/22/2016  . Arthrofibrosis of knee joint 08/28/2015  . Left hip pain 10/08/2014  . Type  2 diabetes mellitus without complication, without long-term current use of insulin ([place]) 10/08/2014  . Hyperlipidemia LDL goal <100 10/08/2014  . Essential hypertension, benign 08/30/2014  . Diabetes mellitus, type 2 ([place]) 08/30/2014  . Diverticulosis of colon without hemorrhage 08/30/2014   . Chronic low back pain 08/30/2014  . Chronic pain syndrome 08/30/2014  . Asthma in adult without complication 40/98/1191  . 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 Osteoarthritis of left knee 02/23/2012    History obtained from: chart review and patient.  Shanon Rosser Shadowens's Primary Care Provider is Gildardo Cranker, DO.     Tabitha Oneal is a 62 y.o. female presenting for a sick visit. She was last seen in January 2019 by Gareth Morgan. At that time, she was endorsing a constellation of symptoms including mucous production, sore throat, and chest tightness in conjunction with wheezing. She was started on nasal Atrovent and Mucinex. Spirometry was normal.   Since the last visit, she has continued to have problems. She is having coughing, SOB, and nasal congestion. She reports that she has had symptoms for 3 days now with intense cough and fever at home. She remains on fluticasone only. She did start some Mucinex for these symptoms. She is using her rescue inhaler every four hours with minimal improvement in her symptoms.   She does report that she improved following her last visit with [place] in January 2019. She is disabled and is not around a lot of sick people at this time. She denies getting sick regularly, but she has been seeing [place] more often lately. It does not appear that she has had an immune workup in the past at all. She is unsure whether she has received her Pneumovax but she does believe that she received her flu shot this season.   Asthma/Respiratory Symptom History: She remains on the Breo 200/25 one puff once daily. She did have a CBC with differential in March 2018 that demonstrated an absolute eosinophil count of 168. Aside from the last few weeks, [name]'s asthma has been well controlled. She has not required rescue medication, experienced nocturnal awakenings due to lower respiratory symptoms, nor have activities of daily living been limited. She has required  no Emergency Department or Urgent Care visits for her asthma. She has required zero courses of systemic steroids for asthma exacerbations since the last visit. ACT score today is 17, indicating subpar asthma symptom control.   Allergic Rhinitis Symptom History: She remains on the Astelin 1-2 sprays per nostril daily as well as Singulair 10mg  daily. We did add Flonase two sprays per nostril daily in September 2018. Her last skin testing was performed in 2002 and was positive to trees, mold mix #4, and dust mites on intradermal.   Otherwise, there have been no changes to her past medical history, surgical history, family history, or social history.    Review of Systems: a 14-point review of systems is pertinent for what is mentioned in HPI.  Otherwise, all other systems were negative. Constitutional: negative other than that listed in the HPI Eyes: negative other than that listed in the HPI Ears, nose, mouth, throat, and face: negative other than that listed in the HPI Respiratory: negative other than that listed in the HPI Cardiovascular: negative other than that listed in the HPI Gastrointestinal: negative other than that listed in the HPI Genitourinary: negative other than that listed in the HPI Integument: negative other than that listed in the HPI Hematologic: negative other than that listed  in the HPI Musculoskeletal: negative other than that listed in the HPI Neurological: negative other than that listed in the HPI Allergy/Immunologic: negative other than that listed in the HPI    Objective:   Blood pressure 128/72, pulse 80, resp. rate 16. There is no height or weight on file to calculate BMI.   Physical Exam:  General: Alert, interactive, in no acute distress. Pleasant and smiling.  Eyes: No conjunctival injection bilaterally, no discharge on the right, no discharge on the left and no Horner-Trantas dots present. PERRL bilaterally. EOMI without pain. No photophobia.  Ears:  Right TM pearly gray with normal light reflex, Left TM pearly gray with normal light reflex, Right TM intact without perforation and Left TM intact without perforation.  Nose/Throat: External nose within normal limits and septum midline. Turbinates edematous and pale with clear discharge. Posterior oropharynx erythematous without cobblestoning in the posterior oropharynx. Tonsils 2+ without exudates.  Tongue without thrush. Lungs: Clear to auscultation without wheezing, rhonchi or rales. No increased work of breathing. Dry hacking cough. Non-productive.  CV: Normal S1/S2. No murmurs. Capillary refill <2 seconds.  Skin: Warm and dry, without lesions or rashes. Neuro:   Grossly intact. No focal deficits appreciated. Responsive to questions.  Diagnostic studies:   Spirometry: results normal (FEV1: 1.81/85%, FVC: 2.31/85%, FEV1/FVC: 78%).    Spirometry consistent with normal pattern.   Allergy Studies: none      Salvatore Marvel, MD [facility]

## 2017-07-30 NOTE — Telephone Encounter (Signed)
Patient dropped off all of the papers she has about her dog,

## 2017-07-30 NOTE — Telephone Encounter (Signed)
I have tried and tried to explain to this patient what she needs. Patient dropped off vaccine records for the dog, nothing about this being a service dog.   (copies in your folder)

## 2017-07-30 NOTE — Telephone Encounter (Signed)
Noted.  Will review in the AM.   She needs to get the letter from the [facility] health/disability specialist NOT her PCP

## 2017-07-31 ENCOUNTER — Other Ambulatory Visit: Payer: Self-pay | Admitting: Internal Medicine

## 2017-07-31 NOTE — Telephone Encounter (Signed)
Per Dr.Carter we can not write this letter, letter needs to come from a mental health specialist or a disability specialist.  I called patient and informed her of Dr.Carter's response to paperwork dropped off about her dog Daisy (immunization records from [employer]).  Patient states the letter only needs to state that this is her companion dog since the death of husband.  Patient will further address at pending appointment on Tuesday 08/04/17

## 2017-07-31 NOTE — Addendum Note (Signed)
Addended by: Herbie Drape on: 07/31/2017 08:05 AM   Modules accepted: Orders

## 2017-08-03 ENCOUNTER — Other Ambulatory Visit: Payer: Self-pay | Admitting: Internal Medicine

## 2017-08-03 LAB — CBC WITH DIFFERENTIAL/PLATELET
Basophils Absolute: 0 10*3/uL (ref 0.0–0.2)
Basos: 1 %
EOS (ABSOLUTE): 0 10*3/uL (ref 0.0–0.4)
Eos: 1 %
HEMATOCRIT: 32.2 % — AB (ref 34.0–46.6)
Hemoglobin: 10.4 g/dL — ABNORMAL LOW (ref 11.1–15.9)
Immature Grans (Abs): 0 10*3/uL (ref 0.0–0.1)
Immature Granulocytes: 0 %
LYMPHS ABS: 0.9 10*3/uL (ref 0.7–3.1)
Lymphs: 24 %
MCH: 25.7 pg — ABNORMAL LOW (ref 26.6–33.0)
MCHC: 32.3 g/dL (ref 31.5–35.7)
MCV: 80 fL (ref 79–97)
MONOS ABS: 0.4 10*3/uL (ref 0.1–0.9)
Monocytes: 10 %
Neutrophils Absolute: 2.3 10*3/uL (ref 1.4–7.0)
Neutrophils: 64 %
Platelets: 243 10*3/uL (ref 150–379)
RBC: 4.05 x10E6/uL (ref 3.77–5.28)
RDW: 15.8 % — AB (ref 12.3–15.4)
WBC: 3.6 10*3/uL (ref 3.4–10.8)

## 2017-08-03 LAB — IGE+ALLERGENS ZONE 2(30)
Amer Sycamore IgE Qn: <0.1 kU/L
Aspergillus Fumigatus IgE: <0.1 kU/L
Bahia Grass IgE: <0.1 kU/L
Cat Dander IgE: <0.1 kU/L
Cladosporium Herbarum IgE: <0.1 kU/L
Cockroach, American IgE: <0.1 kU/L
D Farinae IgE: <0.1 kU/L
D Pteronyssinus IgE: <0.1 kU/L
Elm, American IgE: <0.1 kU/L
Hickory, White IgE: <0.1 kU/L
IgE (Immunoglobulin E), Serum: 32 IU/mL (ref 6–495)
Maple/Box Elder IgE: <0.1 kU/L
Nettle IgE: <0.1 kU/L
Penicillium Chrysogen IgE: <0.1 kU/L
Pigweed, Rough IgE: <0.1 kU/L
Sheep Sorrel IgE Qn: <0.1 kU/L
Sweet gum IgE RAST Ql: <0.1 kU/L
Timothy Grass IgE: <0.1 kU/L
White Mulberry IgE: <0.1 kU/L

## 2017-08-04 ENCOUNTER — Encounter: Payer: Self-pay | Admitting: Internal Medicine

## 2017-08-04 ENCOUNTER — Ambulatory Visit (INDEPENDENT_AMBULATORY_CARE_PROVIDER_SITE_OTHER): Payer: Medicare HMO | Admitting: Internal Medicine

## 2017-08-04 VITALS — BP 148/80 | HR 68 | Temp 98.2°F | Ht 65.0 in | Wt 202.8 lb

## 2017-08-04 DIAGNOSIS — I10 Essential (primary) hypertension: Secondary | ICD-10-CM

## 2017-08-04 DIAGNOSIS — E1169 Type 2 diabetes mellitus with other specified complication: Secondary | ICD-10-CM

## 2017-08-04 DIAGNOSIS — E785 Hyperlipidemia, unspecified: Secondary | ICD-10-CM | POA: Diagnosis not present

## 2017-08-04 DIAGNOSIS — E119 Type 2 diabetes mellitus without complications: Secondary | ICD-10-CM

## 2017-08-04 DIAGNOSIS — J4540 Moderate persistent asthma, uncomplicated: Secondary | ICD-10-CM

## 2017-08-04 DIAGNOSIS — Z79899 Other long term (current) drug therapy: Secondary | ICD-10-CM

## 2017-08-04 DIAGNOSIS — G894 Chronic pain syndrome: Secondary | ICD-10-CM | POA: Diagnosis not present

## 2017-08-04 NOTE — Progress Notes (Signed)
Patient ID: Tabitha Oneal, female   DOB: June 08, 1955, 62 y.o.   MRN: [id number]   Location:  [facility] OFFICE  Provider: DR Arletha Grippe  Code Status:  Goals of Care:  Advanced Directives 01/28/2017  Does Patient Have a Medical Advance Directive? Yes  Does patient want to make changes to medical advance directive? Yes (MAU/Ambulatory/Procedural Areas - Information given)  Would patient like information on creating a medical advance directive? -  Pre-existing out of facility DNR order (yellow form or pink MOST form) -     Chief Complaint  Patient presents with  . Medical Management of Chronic Issues    6 months F/U  . Medication Refill    No Refills    HPI: Patient is a 62 y.o. female seen today for medical management of chronic diseases.  She is trying to get a letter to say that her dog is a companion dog. She has had the dog since the death of her spouse several yrs ago. The dog has never been designated as a Engineer, materials by a mental health provider. She will be moving to a new apt next week. The new landlord has a Pension scheme manager.  She still has issues with her vision. 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  DM - BS usually 130-160. No low BS reactions. Controlled overall. She takes metformin and tradjenta. No low BS reactions. Occasional tingling in left foot. A1c 6.3%. Microalbumin/Cr ratio 12. She takes ACEI and statin  HTN - stable on quinapril  Hyperlipidemia - she has LUE "pressure" but no myalgias on lipitor. LDL 87.   Asthma, moderate persistent, uncomplicated - she reports frequent flares and wheezing. Followed by asthma specialist Dr Ernst Bowler. She takes singulair, Breo, prn albuterol HFA and astelin spray. Qvar stopped due to ineffective.  OA/chronic pain - stable. she has intermittent left knee pain despite having knee surgery in April 2017. No plans to repeat sx due to c/a risk of infection. She is followed by Ortho Dr Nicki Reaper. She wears left knee brace daily but has  difficulty walking and uses a cane to ambulate. Completed PT. Exercise tolerance low due to pain. Followed by pain specialist [facility] ([address]). She takes oxycodone TID, baclofen and meloxicam. Alk phos 141   Past Medical History:  Diagnosis Date  . Asthma    QVAR daily and Albuterol as needed  . Bladder infection    taking Keflex daily   . Chronic back pain    reason unknown  . Diabetes mellitus    takes Metformin and Tradjenta daily  . GERD (gastroesophageal reflux disease)    takes Omeprazole daily  . Hard of hearing   . Headache    daily.Takes Excedrine daily  . History of blood transfusion    no abnormal reaction noted  . Hyperlipidemia    takes Atorvastatin daily  . Hypertension    takes Quinapril daily  . Joint pain   . Joint swelling   . Nocturia   . Osteoarthritis of left knee 02/23/2012  . Seasonal allergies    takes Claritin and Singulair daily.Nasal spray as needed  . Shortness of breath dyspnea    with exertion    Past Surgical History:  Procedure Laterality Date  . ABDOMINAL HYSTERECTOMY    . INJECTION KNEE  04/09/2012   Procedure: KNEE INJECTION;  Surgeon: Johnny Bridge, MD;  Location: [facility];  Service: Orthopedics;  Laterality: Left;  . JOINT REPLACEMENT  acl   bil knees  . KNEE ARTHROTOMY Left 08/28/2015   Procedure: LEFT KNEE ARTHROFIBROSIS EXCISION; pylectomy;  Surgeon: Meredith Pel, MD;  Location: [facility];  Service: Orthopedics;  Laterality: Left;  . KNEE CLOSED REDUCTION  04/09/2012   Procedure: CLOSED MANIPULATION KNEE;  Surgeon: Johnny Bridge, MD;  Location: [facility];  Service: Orthopedics;  Laterality: Left;  Manipulation Knee with Anesthesia includes Application of Traction   . TOTAL KNEE ARTHROPLASTY  02/23/2012   Procedure: TOTAL KNEE ARTHROPLASTY;  Surgeon: Johnny Bridge, MD;  Location: [facility];  Service: Orthopedics;  Laterality: Left;     reports that  has never smoked. she has never  used smokeless tobacco. She reports that she does not drink alcohol or use drugs. Social History   Socioeconomic History  . Marital status: Widowed    Spouse name: Not on file  . Number of children: Not on file  . Years of education: Not on file  . Highest education level: Not on file  Social Needs  . Financial resource strain: Not on file  . Food insecurity - worry: Not on file  . Food insecurity - inability: Not on file  . Transportation needs - medical: Not on file  . Transportation needs - non-medical: Not on file  Occupational History  . Not on file  Tobacco Use  . Smoking status: Never Smoker  . Smokeless tobacco: Never Used  Substance and Sexual Activity  . Alcohol use: No    Alcohol/week: 0.0 oz  . Drug use: No  . Sexual activity: Yes    Birth control/protection: Surgical  Other Topics Concern  . Not on file  Social History Narrative   Diet: none   Do you drink/eat things with caffeine ? Coffee    Material status: widow     What year were you married? 08/01/1978   Do you live in a house, apartment, assisted living,condo, trailer,ect.)? Townhouse (temporary)    Is it one or more stories? Yes   How many persons live in your home? Two   Do you have any pets in your home ? Yes, Shih-tzu   Current or past profession: none   Do you exercise? No  Type & how often: no   Do you have a living will ? No   Do you have a DNR form? No   If not, do you want to discuss one?  Not now   Do you have signed POA /HPOA forms? No    If so, please bring to your appointment.                                  Family History  Problem Relation Age of Onset  . Bronchitis Father 59  . Alzheimer's disease Mother   . Diabetes Daughter   . Diabetes Daughter   . Diabetes Unknown   . Colon cancer Neg Hx   . Esophageal cancer Neg Hx   . Rectal cancer Neg Hx   . Stomach cancer Neg Hx     No Known Allergies  Outpatient Encounter Medications as of 08/04/2017  Medication Sig  .  Albuterol Sulfate (PROAIR RESPICLICK) 631 (90 Base) MCG/ACT AEPB Inhale 4 puffs into the lungs every 4 (four) hours as needed.  . AMITIZA 24 MCG capsule TAKE 1 CAPSULE BY MOUTH TWICE A DAY WITH A MEAL  . aspirin-acetaminophen-caffeine (EXCEDRIN MIGRAINE) 250-250-65 MG per tablet  Take 2 tablets by mouth daily.  Marland Kitchen atorvastatin (LIPITOR) 40 MG tablet TAKE 1 TABLET BY MOUTH EVERY DAY  . azelastine (ASTELIN) 0.1 % nasal spray USE 1 SPRAY EACH NOSTRIL TWICE DAILY  . baclofen (LIORESAL) 10 MG tablet Take 10 mg by mouth. 1 in morning 2 at bed time  . benzonatate (TESSALON PERLES) 100 MG capsule Take 1 capsule (100 mg total) by mouth 3 (three) times daily as needed for cough.  Marland Kitchen BREO ELLIPTA 200-25 MCG/INH AEPB TAKE 1 PUFF BY MOUTH EVERY DAY  . diclofenac sodium (VOLTAREN) 1 % GEL Apply 8 g topically 2 (two) times daily as needed. On knees  . fluticasone (FLONASE) 50 MCG/ACT nasal spray PLACE 2 SPRAYS INTO EACH NOSTRIL EVERY DAY  . ipratropium (ATROVENT) 0.03 % nasal spray Place 2 sprays into both nostrils 2 (two) times daily.  . metFORMIN (GLUCOPHAGE) 500 MG tablet TAKE ONE TABLET BY MOUTH TWICE DAILY TO CONTROL BLOOD SUGAR  . montelukast (SINGULAIR) 10 MG tablet TAKE 1 TABLET BY MOUTH EVERYDAY AT BEDTIME  . omeprazole (PRILOSEC) 40 MG capsule TAKE 1 CAPSULE BY MOUTH EVERY DAY  . ONE TOUCH ULTRA TEST test strip Check blood sugar once daily as directed  . Oxycodone HCl 10 MG TABS Take 1 tablet (10 mg total) by mouth 4 (four) times daily as needed.  . quinapril (ACCUPRIL) 20 MG tablet TAKE 1 TABLET BY MOUTH AT BEDTIME  . TRADJENTA 5 MG TABS tablet TAKE 1 TABLET (5 MG TOTAL) BY MOUTH DAILY.   No facility-administered encounter medications on file as of 08/04/2017.     Review of Systems:  Review of Systems  Eyes: Positive for visual disturbance.  Musculoskeletal: Positive for arthralgias.  All other systems reviewed and are negative.   Health Maintenance  Topic Date Due  . TETANUS/TDAP  12/17/1973    . OPHTHALMOLOGY EXAM  09/04/2015  . HEMOGLOBIN A1C  08/04/2017  . HIV Screening  05/26/2018 (Originally 12/17/1969)  . PAP SMEAR  10/05/2017  . FOOT EXAM  02/04/2018  . MAMMOGRAM  12/05/2018  . PNEUMOCOCCAL POLYSACCHARIDE VACCINE (2) 07/31/2021  . COLONOSCOPY  02/03/2026  . INFLUENZA VACCINE  Completed  . Hepatitis C Screening  Completed    Physical Exam: Vitals:   08/04/17 1032  BP: (!) 148/80  Pulse: 68  Temp: 98.2 F (36.8 C)  TempSrc: Oral  SpO2: 98%  Weight: 202 lb 12.8 oz (92 kg)  Height: '5\' 5"'$  (1.651 m)   Body mass index is 33.75 kg/m. Physical Exam  Constitutional: She is oriented to person, place, and time. She appears well-developed and well-nourished.  HENT:  Mouth/Throat: Oropharynx is clear and moist. No oropharyngeal exudate.  MMM; no oral thrush  Eyes: Pupils are equal, round, and reactive to light. No scleral icterus.  Neck: Neck supple. Carotid bruit is not present. No tracheal deviation present. No thyromegaly present.  Cardiovascular: Normal rate, regular rhythm and intact distal pulses. Exam reveals no gallop and no friction rub.  Murmur (1/6 SEM) heard. No LE edema b/l. no calf TTP.   Pulmonary/Chest: Effort normal and breath sounds normal. No stridor. No respiratory distress. She has no wheezes. She has no rales.  Abdominal: Soft. Normal appearance and bowel sounds are normal. She exhibits no distension and no mass. There is no hepatomegaly. There is no tenderness. There is no rigidity, no rebound and no guarding. No hernia.  Musculoskeletal: She exhibits edema.  Lymphadenopathy:    She has no cervical adenopathy.  Neurological: She is alert and  oriented to person, place, and time. Gait (uses cane) abnormal.  Skin: Skin is warm and dry. No rash noted.  Psychiatric: She has a normal mood and affect. Her behavior is normal. Judgment and thought content normal.    Labs reviewed: Basic Metabolic Panel: Recent Labs    02/04/17 1227  NA 142  K 4.2   CL 106  CO2 31  GLUCOSE 112*  BUN 9  CREATININE 0.78  CALCIUM 9.3  TSH 0.71   Liver Function Tests: Recent Labs    02/04/17 1227  AST 14  ALT 11  BILITOT 0.3  PROT 6.8   No results for input(s): LIPASE, AMYLASE in the last 8760 hours. No results for input(s): AMMONIA in the last 8760 hours. CBC: Recent Labs    07/30/17 1138  WBC 3.6  NEUTROABS 2.3  HGB 10.4*  HCT 32.2*  MCV 80  PLT 243   Lipid Panel: Recent Labs    02/04/17 1227  CHOL 129  HDL 51  TRIG 70  CHOLHDL 2.5   Lab Results  Component Value Date   HGBA1C 6.3 (H) 02/04/2017    Procedures since last visit: No results found.  Assessment/Plan   ICD-10-CM   1. Essential hypertension, benign I10   2. Type 2 diabetes mellitus without complication, without long-term current use of insulin (HCC) E11.9 CMP with eGFR([facility])    Hemoglobin A1c  3. Hyperlipidemia due to type 2 diabetes mellitus (HCC) E11.69 Lipid Panel   E78.5   4. Moderate persistent asthma in adult without complication T09.31   5. Chronic pain syndrome G89.4   6. High risk medication use Z79.899 CMP with eGFR([facility])   Continue current medications as ordered  Will call with lab results  Follow up with specialists as scheduled  Follow up in 6 mos CPE/AWV/ECG.     Chaunce Winkels S. Perlie Gold  [facility] [address] [place], [place] [address] [phone] Cell (Monday-Friday 8 AM - 5 PM) [phone] After 5 PM and follow prompts

## 2017-08-04 NOTE — Patient Instructions (Signed)
Continue current medications as ordered  Will call with lab results  Follow up with specialists as scheduled  Follow up in 6 mos CPE/AWV/ECG.

## 2017-08-05 LAB — LIPID PANEL
CHOLESTEROL: 129 mg/dL (ref <200)
HDL: 43 mg/dL — ABNORMAL LOW (ref >50)
LDL Cholesterol (Calc): 69 mg/dL (calc)
Non-HDL Cholesterol (Calc): 86 mg/dL (calc) (ref <130)
Total CHOL/HDL Ratio: 3 (calc) (ref <5.0)
Triglycerides: 91 mg/dL (ref <150)

## 2017-08-05 LAB — COMPLETE METABOLIC PANEL WITH GFR
AG Ratio: 1.3 (calc) (ref 1.0–2.5)
ALBUMIN MSPROF: 4 g/dL (ref 3.6–5.1)
ALKALINE PHOSPHATASE (APISO): 110 U/L (ref 33–130)
ALT: 12 U/L (ref 6–29)
AST: 12 U/L (ref 10–35)
BILIRUBIN TOTAL: 0.4 mg/dL (ref 0.2–1.2)
BUN: 10 mg/dL (ref 7–25)
CHLORIDE: 104 mmol/L (ref 98–110)
CO2: 33 mmol/L — ABNORMAL HIGH (ref 20–32)
Calcium: 9.4 mg/dL (ref 8.6–10.4)
Creat: 0.78 mg/dL (ref 0.50–0.99)
GFR, EST AFRICAN AMERICAN: 94 mL/min/{1.73_m2} (ref >=60)
GFR, Est Non African American: 81 mL/min/{1.73_m2} (ref >=60)
GLOBULIN: 3.1 g/dL (ref 1.9–3.7)
Glucose, Bld: 120 mg/dL — ABNORMAL HIGH (ref 65–99)
Potassium: 3.8 mmol/L (ref 3.5–5.3)
SODIUM: 144 mmol/L (ref 135–146)
Total Protein: 7.1 g/dL (ref 6.1–8.1)

## 2017-08-05 LAB — HEMOGLOBIN A1C
EAG (MMOL/L): 7.6 (calc)
Hgb A1c MFr Bld: 6.4 % of total Hgb — ABNORMAL HIGH (ref <5.7)
Mean Plasma Glucose: 137 (calc)

## 2017-08-06 ENCOUNTER — Encounter: Payer: Self-pay | Admitting: Internal Medicine

## 2017-08-06 DIAGNOSIS — Z79899 Other long term (current) drug therapy: Secondary | ICD-10-CM | POA: Insufficient documentation

## 2017-08-06 LAB — STREP PNEUMONIAE 23 SEROTYPES IGG
Pneumo Ab Type 1*: 0.4 ug/mL — ABNORMAL LOW
Pneumo Ab Type 12 (12F)*: <0.1 ug/mL — ABNORMAL LOW
Pneumo Ab Type 14*: 4 ug/mL
Pneumo Ab Type 17 (17F)*: <0.1 ug/mL — ABNORMAL LOW
Pneumo Ab Type 19 (19F)*: 0.9 ug/mL — ABNORMAL LOW
Pneumo Ab Type 2*: <0.1 ug/mL — ABNORMAL LOW
Pneumo Ab Type 20*: <0.1 ug/mL — ABNORMAL LOW
Pneumo Ab Type 22 (22F)*: <0.1 ug/mL — ABNORMAL LOW
Pneumo Ab Type 23 (23F)*: <0.1 ug/mL — ABNORMAL LOW
Pneumo Ab Type 26 (6B)*: <0.1 ug/mL — ABNORMAL LOW
Pneumo Ab Type 3*: <0.1 ug/mL — ABNORMAL LOW
Pneumo Ab Type 34 (10A)*: <0.1 ug/mL — ABNORMAL LOW
Pneumo Ab Type 4*: <0.1 ug/mL — ABNORMAL LOW
Pneumo Ab Type 43 (11A)*: 0.3 ug/mL — ABNORMAL LOW
Pneumo Ab Type 5*: <0.2 ug/mL — ABNORMAL LOW
Pneumo Ab Type 51 (7F)*: 4.4 ug/mL
Pneumo Ab Type 54 (15B)*: 1.2 ug/mL — ABNORMAL LOW
Pneumo Ab Type 56 (18C)*: <0.1 ug/mL — ABNORMAL LOW
Pneumo Ab Type 57 (19A)*: 0.9 ug/mL — ABNORMAL LOW
Pneumo Ab Type 68 (9V)*: 0.9 ug/mL — ABNORMAL LOW
Pneumo Ab Type 70 (33F)*: <0.1 ug/mL — ABNORMAL LOW
Pneumo Ab Type 8*: 2.1 ug/mL
Pneumo Ab Type 9 (9N)*: 4 ug/mL

## 2017-08-06 LAB — IGG, IGA, IGM
IgA/Immunoglobulin A, Serum: 82 mg/dL — ABNORMAL LOW (ref 87–352)
IgG (Immunoglobin G), Serum: 1613 mg/dL — ABNORMAL HIGH (ref 700–1600)
IgM (Immunoglobulin M), Srm: 18 mg/dL — ABNORMAL LOW (ref 26–217)

## 2017-08-06 LAB — COMPLEMENT, TOTAL

## 2017-08-06 LAB — DIPHTHERIA / TETANUS ANTIBODY PANEL
Diphtheria Ab: <0.1 [IU]/mL — ABNORMAL LOW
Tetanus Ab, IgG: 0.49 [IU]/mL

## 2017-08-07 ENCOUNTER — Telehealth: Payer: Self-pay | Admitting: *Deleted

## 2017-08-07 MED ORDER — TETANUS-DIPHTH-ACELL PERTUSSIS 5-2.5-18.5 LF-MCG/0.5 IM SUSP: 0.5000 mL | Freq: Once | INTRAMUSCULAR | 0 refills | Status: AC

## 2017-08-07 NOTE — Telephone Encounter (Signed)
Patient notified and agreed.  Tdap order placed.

## 2017-08-07 NOTE — Telephone Encounter (Signed)
Ok for Tdap vaccine

## 2017-08-07 NOTE — Telephone Encounter (Signed)
Patient called and stated that her Asthma Dr requested for her to get the Pneumonia and Tdap Vaccinations. Patient would like these. Patient had Pneumococcal 23 in 07/31/16. No Tdap on file. Please Advise.

## 2017-09-07 ENCOUNTER — Other Ambulatory Visit: Payer: Self-pay

## 2017-09-07 MED ORDER — LINAGLIPTIN 5 MG PO TABS: 5.0000 mg | ORAL_TABLET | Freq: Every day | ORAL | 1 refills | Status: DC

## 2017-09-08 ENCOUNTER — Other Ambulatory Visit: Payer: Self-pay | Admitting: Internal Medicine

## 2017-10-29 ENCOUNTER — Ambulatory Visit: Payer: Medicare HMO | Admitting: Allergy & Immunology

## 2017-10-29 ENCOUNTER — Encounter: Payer: Self-pay | Admitting: Allergy & Immunology

## 2017-10-29 VITALS — BP 130/60 | HR 77 | Temp 98.4°F | Resp 18 | Ht 63.4 in | Wt 199.2 lb

## 2017-10-29 DIAGNOSIS — J302 Other seasonal allergic rhinitis: Secondary | ICD-10-CM | POA: Diagnosis not present

## 2017-10-29 DIAGNOSIS — B999 Unspecified infectious disease: Secondary | ICD-10-CM | POA: Diagnosis not present

## 2017-10-29 DIAGNOSIS — J4540 Moderate persistent asthma, uncomplicated: Secondary | ICD-10-CM

## 2017-10-29 DIAGNOSIS — J3089 Other allergic rhinitis: Secondary | ICD-10-CM | POA: Diagnosis not present

## 2017-10-29 MED ORDER — MONTELUKAST SODIUM 10 MG PO TABS: ORAL_TABLET | ORAL | 5 refills | Status: DC

## 2017-10-29 MED ORDER — FLUTICASONE FUROATE-VILANTEROL 200-25 MCG/INH IN AEPB: 1.0000 | INHALATION_SPRAY | Freq: Every day | RESPIRATORY_TRACT | 5 refills | Status: DC

## 2017-10-29 MED ORDER — FLUTICASONE PROPIONATE 50 MCG/ACT NA SUSP: NASAL | 5 refills | Status: DC

## 2017-10-29 NOTE — Progress Notes (Signed)
FOLLOW UP  Date of Service/Encounter:  10/29/17   Assessment:   Moderate persistent asthma without complication  Seasonal and perennial allergic rhinitis (trees, molds, dust mites)  Recurrent infections - with non-protective titers to Strep pneumoniae and Diphtheria  Complicated past medical history including left knee surgery and continued pain in her knees and her bilateral feet   Tabitha Oneal seems to be doing very well at this time, especially with regards to her asthma control. We will not make any changes at this time, but in the future she does have an elevated enough eosinophil count to qualify for an anti-IL5 medication. Allergies are fairly well controlled with the nasal sprays and antihistamines. While we were working her up for recurrent infections, it seems that she has had no breakthrough infections for the last several months. Therefore we can defer on further workup at this time. She has multiple complaints about her pain control today, but I redirected her to her PCP and Pain Management to address these concerns.   Plan/Recommendations:    1. Moderate persistent asthma, uncomplicated - Lung testing looked normal and stable today. - We will not make any medication changes at this time. - Daily controller medication(s): Breo 200/32mcg one puff once daily - Prior to physical activity: ProAir 2 puffs 10-15 minutes before physical activity. - Rescue medications: ProAir 4 puffs every 4-6 hours as needed or albuterol nebulizer one vial every 4-6 hours as needed - Asthma control goals:  * Full participation in all desired activities (may need albuterol before activity) * Albuterol use two time or less a week on average (not counting use with activity) * Cough interfering with sleep two time or less a month * Oral steroids no more than once a year * No hospitalizations  2. Seasonal and perennial allergic rhinitis - Recent testing was negative (blood work).  - Continue  with fluticasone nasal spray two sprays per nostril daily. - Continue with Singulair 10mg  daily.  3. GERD  - Continue with omeprazole daily  4. Recurrent infections - We will hold off on the workup for now since you have not had any infections in the meantime.  5. Return in about 6 months (around 04/30/2018).   Subjective:   Tabitha Oneal is a 62 y.o. female presenting today for follow up of  Chief Complaint  Patient presents with  . Follow-up    Tabitha Oneal has a history of the following: Patient Active Problem List   Diagnosis Date Noted  . High risk medication use 08/06/2017  . Seasonal and perennial allergic rhinitis 07/30/2017  . Recurrent infections 07/30/2017  . Cough 06/08/2017  . Moderate persistent asthma, uncomplicated 43/32/9518  . Chronic constipation 02/04/2017  . 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 Hallux rigidus, left foot 01/01/2017  . Postmenopausal symptoms 02/22/2016  . Arthrofibrosis of knee joint 08/28/2015  . Left hip pain 10/08/2014  . Type 2 diabetes mellitus without complication, without long-term current use of insulin ([place]) 10/08/2014  . Hyperlipidemia LDL goal <100 10/08/2014  . Essential hypertension, benign 08/30/2014  . Diabetes mellitus, type 2 ([place]) 08/30/2014  . Diverticulosis of colon without hemorrhage 08/30/2014  . Chronic low back pain 08/30/2014  . Chronic pain syndrome 08/30/2014  . Asthma in adult without complication 84/16/6063  . 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 Osteoarthritis of left knee 02/23/2012    History obtained from: chart review and patient.  Tabitha Oneal's Primary  Care Provider is Gildardo Cranker, DO.     Tabitha Oneal is a 62 y.o. female presenting for a follow up visit.  She was last seen in March 2019.  At that time, her asthma looked like it was under fairly good control.  Because we had been consistently increasing her controller  medication, we did get some blood work to see if she would qualify for one of our biologics.  Her IgE was not elevated, but she did have an absolute eosinophil count of 168.  We continued her on Breo 200/25 mcg 1 puff once daily.  We did obtain a zone 2 to see if we could add to her environmental triggers.  This was negative.  We continued her on Flonase as well as Singulair.  We did recommend using Mucinex and simply saline for presumed viral sinusitis.  We did start her on a prednisone pack and Tessalon Perles.  She did not need antibiotics.  She has a history of recurrent infections and we did obtain screening labs.  She was only protective to 4 out of 23 types of pneumococcus.  She was not protective to diphtheria.  I did recommend that she get a Tdap as well as a Pneumovax.  It turns out that she received a Pneumovax in March 2018.  She does not think that she received a Tdap.  Since the last visit, she has done very well.  Her asthma has been under good control.  She is on the Breo 1 puff once daily which is providing excellent relief of her symptoms.  She is happy with the co-pay of this medication.Tabitha Oneal's asthma has been well controlled. She has not required rescue medication, experienced nocturnal awakenings due to lower respiratory symptoms, nor have activities of daily living been limited. She has required no Emergency Department or Urgent Care visits for her asthma. She has required zero courses of systemic steroids for asthma exacerbations since the last visit. ACT score today is 25, indicating excellent asthma symptom control.     Her seasonal and perennial allergic rhinitis is controlled with a nasal spray and Singulair. She does take an antihistamine intermittently.  She has had no sinus infections in the interim.  She never got any of the recommended vaccinations.  She does tell me extensively about her bilateral knee pain.  It is worse in the left.  She is also endorsing pain on the  bottoms of both of her feet which is more prominent in the mornings.  She is followed by pain management, but according to the patient she was told that she would have to pay a $100 co-pay before they see her again.  She is normally on hydrocodone, but has been out of this for quite some time.  She is also on baclofen.  She is quite worried about her pain and has a lot to say about it today.  She has not talked to her primary care provider about this.  Otherwise, there have been no changes to her past medical history, surgical history, family history, or social history.    Review of Systems: a 14-point review of systems is pertinent for what is mentioned in HPI.  Otherwise, all other systems were negative. Constitutional: negative other than that listed in the HPI Eyes: negative other than that listed in the HPI Ears, nose, mouth, throat, and face: negative other than that listed in the HPI Respiratory: negative other than that listed in the HPI Cardiovascular: negative other than that listed in the  HPI Gastrointestinal: negative other than that listed in the HPI Genitourinary: negative other than that listed in the HPI Integument: negative other than that listed in the HPI Hematologic: negative other than that listed in the HPI Musculoskeletal: negative other than that listed in the HPI Neurological: negative other than that listed in the HPI Allergy/Immunologic: negative other than that listed in the HPI    Objective:   Blood pressure 130/60, pulse 77, temperature 98.4 F (36.9 C), temperature source Oral, resp. rate 18, height 5' 3.4" (1.61 m), weight 199 lb 3.2 oz (90.4 kg), SpO2 (!) 77 %. Body mass index is 34.84 kg/m.   Physical Exam:  General: Alert, interactive, in no acute distress. Pleasant female.  Eyes: No conjunctival injection bilaterally, no discharge on the right, no discharge on the left and no Horner-Trantas dots present. PERRL bilaterally. EOMI without pain. No  photophobia.  Ears: Right TM pearly gray with normal light reflex, Left TM pearly gray with normal light reflex, Right TM intact without perforation and Left TM intact without perforation.  Nose/Throat: External nose within normal limits and septum midline. Turbinates edematous with clear discharge. Posterior oropharynx mildly erythematous without cobblestoning in the posterior oropharynx. Tonsils 2+ without exudates.  Tongue without thrush. Lungs: Clear to auscultation without wheezing, rhonchi or rales. No increased work of breathing. CV: Normal S1/S2. No murmurs. Capillary refill <2 seconds.  Skin: Warm and dry, without lesions or rashes. Neuro:   Grossly intact. No focal deficits appreciated. Responsive to questions.  Diagnostic studies:   Spirometry: results normal (FEV1: 1.92/98%, FVC: 2.37/95%, FEV1/FVC: 81%).    Spirometry consistent with normal pattern.   Allergy Studies: none      Salvatore Marvel, MD  [facility]

## 2017-10-29 NOTE — Patient Instructions (Addendum)
1. Moderate persistent asthma, uncomplicated - Lung testing looked normal and stable today. - We will not make any medication changes at this time. - Daily controller medication(s): Breo 200/27mcg one puff once daily - Prior to physical activity: ProAir 2 puffs 10-15 minutes before physical activity. - Rescue medications: ProAir 4 puffs every 4-6 hours as needed or albuterol nebulizer one vial every 4-6 hours as needed - Asthma control goals:  * Full participation in all desired activities (may need albuterol before activity) * Albuterol use two time or less a week on average (not counting use with activity) * Cough interfering with sleep two time or less a month * Oral steroids no more than once a year * No hospitalizations  2. Seasonal and perennial allergic rhinitis - Recent testing was negative (blood work).  - Continue with fluticasone nasal spray two sprays per nostril daily. - Continue with Singulair 10mg  daily.  3. GERD  - Continue with omeprazole daily  4. Recurrent infections - We will hold off on the workup for now since you have not had any infections in the meantime.  5. Return in about 6 months (around 04/30/2018).    Please inform us of any Emergency Department visits, hospitalizations, or changes in symptoms. Call us before going to the ED for breathing or allergy symptoms since we might be able to fit you in for a sick visit. Feel free to contact us anytime with any questions, problems, or concerns.  It was a pleasure to see you again today!  Websites that have reliable patient information: 1. American Academy of Asthma, Allergy, and Immunology: www.aaaai.org 2. Food Allergy Research and Education (FARE): foodallergy.org 3. Mothers of Asthmatics: http://www.asthmacommunitynetwork.org 4. American College of Allergy, Asthma, and Immunology: MonthlyElectricBill.co.uk   Make sure you are registered to vote!

## 2017-11-02 ENCOUNTER — Other Ambulatory Visit: Payer: Self-pay | Admitting: Internal Medicine

## 2017-11-02 DIAGNOSIS — Z1231 Encounter for screening mammogram for malignant neoplasm of breast: Secondary | ICD-10-CM

## 2017-12-07 ENCOUNTER — Ambulatory Visit
Admission: RE | Admit: 2017-12-07 | Discharge: 2017-12-07 | Disposition: A | Discharge status: Home / Self Care | Payer: Medicare Other | Source: Ambulatory Visit | Attending: Internal Medicine | Admitting: Internal Medicine

## 2017-12-07 DIAGNOSIS — Z1231 Encounter for screening mammogram for malignant neoplasm of breast: Secondary | ICD-10-CM

## 2017-12-26 ENCOUNTER — Other Ambulatory Visit: Payer: Self-pay | Admitting: Internal Medicine

## 2018-01-13 ENCOUNTER — Encounter: Payer: Self-pay | Admitting: Internal Medicine

## 2018-01-23 ENCOUNTER — Other Ambulatory Visit: Payer: Self-pay | Admitting: Family Medicine

## 2018-02-03 ENCOUNTER — Other Ambulatory Visit: Payer: Self-pay | Admitting: Allergy

## 2018-02-05 ENCOUNTER — Other Ambulatory Visit: Payer: Self-pay

## 2018-02-05 MED ORDER — IPRATROPIUM BROMIDE 0.03 % NA SOLN: 2.0000 | Freq: Two times a day (BID) | NASAL | 3 refills | Status: DC

## 2018-02-05 NOTE — Telephone Encounter (Signed)
Refilled ipratropium 0.03% x's 3. Pt. Has office visit in December.

## 2018-02-08 ENCOUNTER — Other Ambulatory Visit: Payer: Self-pay | Admitting: Allergy

## 2018-02-08 MED ORDER — IPRATROPIUM BROMIDE 0.03 % NA SOLN: 2.0000 | Freq: Two times a day (BID) | NASAL | 2 refills | Status: DC

## 2018-02-11 ENCOUNTER — Ambulatory Visit (INDEPENDENT_AMBULATORY_CARE_PROVIDER_SITE_OTHER): Payer: Medicare Other

## 2018-02-11 VITALS — BP 138/75 | HR 83 | Temp 98.1°F | Ht 63.0 in | Wt 201.0 lb

## 2018-02-11 DIAGNOSIS — Z23 Encounter for immunization: Secondary | ICD-10-CM

## 2018-02-11 DIAGNOSIS — Z0000 Encounter for general adult medical examination without abnormal findings: Secondary | ICD-10-CM | POA: Diagnosis not present

## 2018-02-11 MED ORDER — TETANUS-DIPHTH-ACELL PERTUSSIS 5-2.5-18.5 LF-MCG/0.5 IM SUSP: 0.5000 mL | Freq: Once | INTRAMUSCULAR | 0 refills | Status: AC

## 2018-02-11 NOTE — Progress Notes (Signed)
Subjective:   Tabitha Oneal is a 62 y.o. female who presents for Medicare Annual (Subsequent) preventive examination. Last AWV-01/28/2017       Objective:     Vitals: BP 138/75 (BP Location: Left Arm, Patient Position: Sitting)   Pulse 83   Temp 98.1 F (36.7 C) (Oral)   Ht 5\' 3"  (1.6 m)   Wt 201 lb (91.2 kg)   SpO2 97%   BMI 35.61 kg/m   Body mass index is 35.61 kg/m.  Advanced Directives 02/11/2018 01/28/2017 07/31/2016 02/22/2016 11/23/2015 09/24/2015 08/28/2015  Does Patient Have a Medical Advance Directive? No Yes No No No Yes No  Does patient want to make changes to medical advance directive? - Yes (MAU/Ambulatory/Procedural Areas - Information given) - - - - -  Would patient like information on creating a medical advance directive? Yes (MAU/Ambulatory/Procedural Areas - Information given) - Yes (MAU/Ambulatory/Procedural Areas - Information given) Yes - Educational materials given Yes - Educational materials given No - patient declined information No - patient declined information  Pre-existing out of facility DNR order (yellow form or pink MOST form) - - - - - - -    Tobacco Social History   Tobacco Use  Smoking Status Never Smoker  Smokeless Tobacco Never Used     Counseling given: Not Answered   Clinical Intake:  Pre-visit preparation completed: No  Pain : No/denies pain     Nutritional Risks: Other (Comment) Diabetes: Yes CBG done?: No Did pt. bring in CBG monitor from home?: No  How often do you need to have someone help you when you read instructions, pamphlets, or other written materials from your doctor or pharmacy?: 1 - Never What is the last grade level you completed in school?: 2 years of college  Interpreter Needed?: No  Information entered by :: Tyson Dense, RN  Past Medical History:  Diagnosis Date  . Asthma    QVAR daily and Albuterol as needed  . Bladder infection    taking Keflex daily   . Chronic back pain    reason unknown  .  Diabetes mellitus    takes Metformin and Tradjenta daily  . GERD (gastroesophageal reflux disease)    takes Omeprazole daily  . Hard of hearing   . Headache    daily.Takes Excedrine daily  . History of blood transfusion    no abnormal reaction noted  . Hyperlipidemia    takes Atorvastatin daily  . Hypertension    takes Quinapril daily  . Joint pain   . Joint swelling   . Nocturia   . Osteoarthritis of left knee 02/23/2012  . Seasonal allergies    takes Claritin and Singulair daily.Nasal spray as needed  . Shortness of breath dyspnea    with exertion   Past Surgical History:  Procedure Laterality Date  . ABDOMINAL HYSTERECTOMY    . INJECTION KNEE  04/09/2012   Procedure: KNEE INJECTION;  Surgeon: Johnny Bridge, MD;  Location: [facility];  Service: Orthopedics;  Laterality: Left;  . JOINT REPLACEMENT     acl   bil knees  . KNEE ARTHROTOMY Left 08/28/2015   Procedure: LEFT KNEE ARTHROFIBROSIS EXCISION; pylectomy;  Surgeon: Meredith Pel, MD;  Location: [facility];  Service: Orthopedics;  Laterality: Left;  . KNEE CLOSED REDUCTION  04/09/2012   Procedure: CLOSED MANIPULATION KNEE;  Surgeon: Johnny Bridge, MD;  Location: [facility];  Service: Orthopedics;  Laterality: Left;  Manipulation Knee with Anesthesia includes Application of  Traction   . TOTAL KNEE ARTHROPLASTY  02/23/2012   Procedure: TOTAL KNEE ARTHROPLASTY;  Surgeon: Johnny Bridge, MD;  Location: [facility];  Service: Orthopedics;  Laterality: Left;   Family History  Problem Relation Age of Onset  . Bronchitis Father 90  . Alzheimer's disease Mother   . Diabetes Daughter   . Diabetes Daughter   . Diabetes Unknown   . Colon cancer Neg Hx   . Esophageal cancer Neg Hx   . Rectal cancer Neg Hx   . Stomach cancer Neg Hx    Social History   Socioeconomic History  . Marital status: Widowed    Spouse name: Not on file  . Number of children: Not on file  . Years of education: Not on file    . Highest education level: Not on file  Occupational History  . Not on file  Social Needs  . Financial resource strain: Somewhat hard  . Food insecurity:    Worry: Sometimes true    Inability: Sometimes true  . Transportation needs:    Medical: No    Non-medical: No  Tobacco Use  . Smoking status: Never Smoker  . Smokeless tobacco: Never Used  Substance and Sexual Activity  . Alcohol use: No    Alcohol/week: 0.0 standard drinks  . Drug use: No  . Sexual activity: Yes    Birth control/protection: Surgical  Lifestyle  . Physical activity:    Days per week: 7 days    Minutes per session: 30 min  . Stress: To some extent  Relationships  . Social connections:    Talks on phone: Twice a week    Gets together: Once a week    Attends religious service: More than 4 times per year    Active member of club or organization: No    Attends meetings of clubs or organizations: Never    Relationship status: Widowed  Other Topics Concern  . Not on file  Social History Narrative   Diet: none   Do you drink/eat things with caffeine ? Coffee    Material status: widow     What year were you married? 08/01/1978   Do you live in a house, apartment, assisted living,condo, trailer,ect.)? Townhouse (temporary)    Is it one or more stories? Yes   How many persons live in your home? Two   Do you have any pets in your home ? Yes, Shih-tzu   Current or past profession: none   Do you exercise? No  Type & how often: no   Do you have a living will ? No   Do you have a DNR form? No   If not, do you want to discuss one?  Not now   Do you have signed POA /HPOA forms? No    If so, please bring to your appointment.                                  Outpatient Encounter Medications as of 02/11/2018  Medication Sig  . Albuterol Sulfate (PROAIR RESPICLICK) 323 (90 Base) MCG/ACT AEPB Inhale 4 puffs into the lungs every 4 (four) hours as needed.  . AMITIZA 24 MCG capsule TAKE 1 CAPSULE BY MOUTH TWICE  A DAY WITH A MEAL  . aspirin-acetaminophen-caffeine (EXCEDRIN MIGRAINE) 250-250-65 MG per tablet Take 2 tablets by mouth daily.  Marland Kitchen atorvastatin (LIPITOR) 40 MG tablet TAKE 1 TABLET BY MOUTH EVERY DAY  .  azelastine (ASTELIN) 0.1 % nasal spray USE 1 SPRAY EACH NOSTRIL TWICE DAILY  . baclofen (LIORESAL) 10 MG tablet Take 10 mg by mouth. 1 in morning 2 at bed time  . benzonatate (TESSALON PERLES) 100 MG capsule Take 1 capsule (100 mg total) by mouth 3 (three) times daily as needed for cough.  . diclofenac sodium (VOLTAREN) 1 % GEL Apply 8 g topically 2 (two) times daily as needed. On knees  . fluticasone (FLONASE) 50 MCG/ACT nasal spray PLACE 2 SPRAYS INTO EACH NOSTRIL EVERY DAY  . fluticasone furoate-vilanterol (BREO ELLIPTA) 200-25 MCG/INH AEPB Inhale 1 puff into the lungs daily.  Marland Kitchen ipratropium (ATROVENT) 0.03 % nasal spray Place 2 sprays into both nostrils 2 (two) times daily.  Marland Kitchen ipratropium (ATROVENT) 0.03 % nasal spray Place 2 sprays into both nostrils 2 (two) times daily.  Marland Kitchen linagliptin (TRADJENTA) 5 MG TABS tablet Take 1 tablet (5 mg total) by mouth daily.  . metFORMIN (GLUCOPHAGE) 500 MG tablet TAKE ONE TABLET BY MOUTH TWICE DAILY TO CONTROL BLOOD SUGAR  . montelukast (SINGULAIR) 10 MG tablet TAKE 1 TABLET BY MOUTH EVERYDAY AT BEDTIME  . omeprazole (PRILOSEC) 40 MG capsule TAKE 1 CAPSULE BY MOUTH EVERY DAY  . ONE TOUCH ULTRA TEST test strip Check blood sugar once daily as directed  . Oxycodone HCl 10 MG TABS Take 1 tablet (10 mg total) by mouth 4 (four) times daily as needed.  . quinapril (ACCUPRIL) 20 MG tablet TAKE 1 TABLET BY MOUTH AT BEDTIME  . Tdap (BOOSTRIX) 5-2.5-18.5 LF-MCG/0.5 injection Inject 0.5 mLs into the muscle once.   No facility-administered encounter medications on file as of 02/11/2018.     Activities of Daily Living In your present state of health, do you have any difficulty performing the following activities: 02/11/2018  Hearing? N  Vision? N  Difficulty  concentrating or making decisions? N  Walking or climbing stairs? N  Dressing or bathing? N  Doing errands, shopping? N  Preparing Food and eating ? N  Using the Toilet? N  In the past six months, have you accidently leaked urine? N  Do you have problems with loss of bowel control? N  Managing your Medications? N  Managing your Finances? N  Housekeeping or managing your Housekeeping? N  Some recent data might be hidden    Patient Care Team: Gildardo Cranker, DO as PCP - General (Internal Medicine)    Assessment:   This is a routine wellness examination for Jasleen.  Exercise Activities and Dietary recommendations Current Exercise Habits: Home exercise routine, Type of exercise: strength training/weights;stretching, Time (Minutes): 30, Frequency (Times/Week): 5, Weekly Exercise (Minutes/Week): 150, Intensity: Mild, Exercise limited by: orthopedic condition(s)  Goals    . Exercise 3x per week (30 min per time)     Patient will look into gyms and water exercises       Fall Risk Fall Risk  02/11/2018 08/04/2017 02/04/2017 01/28/2017 07/31/2016  Falls in the past year? No No Yes Yes No  Number falls in past yr: - - 2 or more 2 or more -  Injury with Fall? - - No No -   Is the patient's home free of loose throw rugs in walkways, pet beds, electrical cords, etc?   yes      Grab bars in the bathroom? no      Handrails on the stairs?   yes      Adequate lighting?   yes  Depression Screen PHQ 2/9 Scores 02/11/2018 01/28/2017 02/22/2016 08/22/2015  PHQ - 2 Score 0 0 0 0     Cognitive Function within normal function        Immunization History  Administered Date(s) Administered  . Influenza Split 02/24/2012  . Influenza,inj,Quad PF,6+ Mos 04/13/2015, 02/22/2016, 01/28/2017  . Pneumococcal Polysaccharide-23 07/31/2016    Qualifies for Shingles Vaccine? Yes, educated and will check cost  Screening Tests Health Maintenance  Topic Date Due  . TETANUS/TDAP  12/17/1973  .  OPHTHALMOLOGY EXAM  09/04/2015  . PAP SMEAR  10/05/2017  . INFLUENZA VACCINE  12/24/2017  . FOOT EXAM  02/04/2018  . HEMOGLOBIN A1C  02/04/2018  . HIV Screening  05/26/2018 (Originally 12/17/1970)  . MAMMOGRAM  12/08/2019  . COLONOSCOPY  02/03/2026  . PNEUMOCOCCAL POLYSACCHARIDE VACCINE AGE 62-64 HIGH RISK  Completed  . Hepatitis C Screening  Completed    Cancer Screenings: Lung: Low Dose CT Chest recommended if Age 50-80 years, 30 pack-year currently smoking OR have quit w/in 15years. Patient does not qualify. Breast:  Up to date on Mammogram? Yes   Up to date of Bone Density/Dexa? Yes Colorectal: up to date  Additional Screenings:  Hepatitis C Screening: declined Flu vaccine due: given TDAP due: ordered to pharmacy     Plan:    I have personally reviewed and addressed the Medicare Annual Wellness questionnaire and have noted the following in the patient's chart:  A. Medical and social history B. Use of alcohol, tobacco or illicit drugs  C. Current medications and supplements D. Functional ability and status E.  Nutritional status F.  Physical activity G. Advance directives H. List of other physicians I.  Hospitalizations, surgeries, and ER visits in previous 12 months J.  [place] to include hearing, vision, cognitive, depression L. Referrals and appointments - none  In addition, I have reviewed and discussed with patient certain preventive protocols, quality metrics, and best practice recommendations. A written personalized care plan for preventive services as well as general preventive health recommendations were provided to patient.  See attached scanned questionnaire for additional information.   Signed,   Tyson Dense, RN Nurse Health Advisor  Patient Concerns: L knee has been aching. Stomach has been cramping and nauseous for last 3 weeks

## 2018-02-11 NOTE — Patient Instructions (Signed)
[name] , Thank you for taking time to come for your Medicare Wellness Visit. I appreciate your ongoing commitment to your health goals. Please review the following plan we discussed and let me know if I can assist you in the future.   Screening recommendations/referrals: Colonoscopy up to date, due 02/03/2026 Mammogram up to date, due 12/08/2019 Bone Density up to date Recommended yearly ophthalmology/optometry visit for glaucoma screening and checkup Recommended yearly dental visit for hygiene and checkup  Vaccinations: Influenza vaccine given today Pneumococcal vaccine due at age 80 Tdap vaccine due, ordered to pharmacy Shingles vaccine due, please check cost    Advanced directives: Advance directive discussed with you today. I have provided a copy for you to complete at home and have notarized. Once this is complete please bring a copy in to our office so we can scan it into your chart.  Conditions/risks identified: none  Next appointment: Tyson Dense, RN 02/14/2019 @ 11:15am  Preventive Care 40-64 Years, Female Preventive care refers to lifestyle choices and visits with your health care provider that can promote health and wellness. What does preventive care include?  A yearly physical exam. This is also called an annual well check.  Dental exams once or twice a year.  Routine eye exams. Ask your health care provider how often you should have your eyes checked.  Personal lifestyle choices, including:  Daily care of your teeth and gums.  Regular physical activity.  Eating a healthy diet.  Avoiding tobacco and drug use.  Limiting alcohol use.  Practicing safe sex.  Taking low-dose aspirin daily starting at age 17.  Taking vitamin and mineral supplements as recommended by your health care provider. What happens during an annual well check? The services and screenings done by your health care provider during your annual well check will depend on your age,  overall health, lifestyle risk factors, and family history of disease. Counseling  Your health care provider may ask you questions about your:  Alcohol use.  Tobacco use.  Drug use.  Emotional well-being.  Home and relationship well-being.  Sexual activity.  Eating habits.  Work and work Statistician.  Method of birth control.  Menstrual cycle.  Pregnancy history. Screening  You may have the following tests or measurements:  Height, weight, and BMI.  Blood pressure.  Lipid and cholesterol levels. These may be checked every 5 years, or more frequently if you are over 76 years old.  Skin check.  Lung cancer screening. You may have this screening every year starting at age 48 if you have a 30-pack-year history of smoking and currently smoke or have quit within the past 15 years.  Fecal occult blood test (FOBT) of the stool. You may have this test every year starting at age 79.  Flexible sigmoidoscopy or colonoscopy. You may have a sigmoidoscopy every 5 years or a colonoscopy every 10 years starting at age 65.  Hepatitis C blood test.  Hepatitis B blood test.  Sexually transmitted disease (STD) testing.  Diabetes screening. This is done by checking your blood sugar (glucose) after you have not eaten for a while (fasting). You may have this done every 1-3 years.  Mammogram. This may be done every 1-2 years. Talk to your health care provider about when you should start having regular mammograms. This may depend on whether you have a family history of breast cancer.  BRCA-related cancer screening. This may be done if you have a family history of breast, ovarian, tubal, or peritoneal cancers.  Pelvic exam and Pap test. This may be done every 3 years starting at age 21. Starting at age 56, this may be done every 5 years if you have a Pap test in combination with an HPV test.  Bone density scan. This is done to screen for osteoporosis. You may have this scan if you are at  high risk for osteoporosis. Discuss your test results, treatment options, and if necessary, the need for more tests with your health care provider. Vaccines  Your health care provider may recommend certain vaccines, such as:  Influenza vaccine. This is recommended every year.  Tetanus, diphtheria, and acellular pertussis (Tdap, Td) vaccine. You may need a Td booster every 10 years.  Zoster vaccine. You may need this after age 73.  Pneumococcal 13-valent conjugate (PCV13) vaccine. You may need this if you have certain conditions and were not previously vaccinated.  Pneumococcal polysaccharide (PPSV23) vaccine. You may need one or two doses if you smoke cigarettes or if you have certain conditions. Talk to your health care provider about which screenings and vaccines you need and how often you need them. This information is not intended to replace advice given to you by your health care provider. Make sure you discuss any questions you have with your health care provider. Document Released: 06/08/2015 Document Revised: 01/30/2016 Document Reviewed: 03/13/2015 Elsevier Interactive Patient Education  2017 [employer] Prevention in the Home Falls can cause injuries. They can happen to people of all ages. There are many things you can do to make your home safe and to help prevent falls. What can I do on the outside of my home?  Regularly fix the edges of walkways and driveways and fix any cracks.  Remove anything that might make you trip as you walk through a door, such as a raised step or threshold.  Trim any bushes or trees on the path to your home.  Use bright outdoor lighting.  Clear any walking paths of anything that might make someone trip, such as rocks or tools.  Regularly check to see if handrails are loose or broken. Make sure that both sides of any steps have handrails.  Any raised decks and porches should have guardrails on the edges.  Have any leaves, snow, or  ice cleared regularly.  Use sand or salt on walking paths during winter.  Clean up any spills in your garage right away. This includes oil or grease spills. What can I do in the bathroom?  Use night lights.  Install grab bars by the toilet and in the tub and shower. Do not use towel bars as grab bars.  Use non-skid mats or decals in the tub or shower.  If you need to sit down in the shower, use a plastic, non-slip stool.  Keep the floor dry. Clean up any water that spills on the floor as soon as it happens.  Remove soap buildup in the tub or shower regularly.  Attach bath mats securely with double-sided non-slip rug tape.  Do not have throw rugs and other things on the floor that can make you trip. What can I do in the bedroom?  Use night lights.  Make sure that you have a light by your bed that is easy to reach.  Do not use any sheets or blankets that are too big for your bed. They should not hang down onto the floor.  Have a firm chair that has side arms. You can use this for  support while you get dressed.  Do not have throw rugs and other things on the floor that can make you trip. What can I do in the kitchen?  Clean up any spills right away.  Avoid walking on wet floors.  Keep items that you use a lot in easy-to-reach places.  If you need to reach something above you, use a strong step stool that has a grab bar.  Keep electrical cords out of the way.  Do not use floor polish or wax that makes floors slippery. If you must use wax, use non-skid floor wax.  Do not have throw rugs and other things on the floor that can make you trip. What can I do with my stairs?  Do not leave any items on the stairs.  Make sure that there are handrails on both sides of the stairs and use them. Fix handrails that are broken or loose. Make sure that handrails are as long as the stairways.  Check any carpeting to make sure that it is firmly attached to the stairs. Fix any carpet  that is loose or worn.  Avoid having throw rugs at the top or bottom of the stairs. If you do have throw rugs, attach them to the floor with carpet tape.  Make sure that you have a light switch at the top of the stairs and the bottom of the stairs. If you do not have them, ask someone to add them for you. What else can I do to help prevent falls?  Wear shoes that:  Do not have high heels.  Have rubber bottoms.  Are comfortable and fit you well.  Are closed at the toe. Do not wear sandals.  If you use a stepladder:  Make sure that it is fully opened. Do not climb a closed stepladder.  Make sure that both sides of the stepladder are locked into place.  Ask someone to hold it for you, if possible.  Clearly mark and make sure that you can see:  Any grab bars or handrails.  First and last steps.  Where the edge of each step is.  Use tools that help you move around (mobility aids) if they are needed. These include:  Canes.  Walkers.  Scooters.  Crutches.  Turn on the lights when you go into a dark area. Replace any light bulbs as soon as they burn out.  Set up your furniture so you have a clear path. Avoid moving your furniture around.  If any of your floors are uneven, fix them.  If there are any pets around you, be aware of where they are.  Review your medicines with your doctor. Some medicines can make you feel dizzy. This can increase your chance of falling. Ask your doctor what other things that you can do to help prevent falls. This information is not intended to replace advice given to you by your health care provider. Make sure you discuss any questions you have with your health care provider. Document Released: 03/08/2009 Document Revised: 10/18/2015 Document Reviewed: 06/16/2014 Elsevier Interactive Patient Education  2017 [employer].

## 2018-02-17 ENCOUNTER — Other Ambulatory Visit: Payer: Self-pay | Admitting: Internal Medicine

## 2018-02-23 ENCOUNTER — Encounter: Payer: Self-pay | Admitting: Nurse Practitioner

## 2018-02-23 ENCOUNTER — Ambulatory Visit (INDEPENDENT_AMBULATORY_CARE_PROVIDER_SITE_OTHER): Payer: Medicare Other | Admitting: Nurse Practitioner

## 2018-02-23 VITALS — BP 140/80 | HR 76 | Temp 98.5°F | Ht 63.0 in | Wt 204.4 lb

## 2018-02-23 DIAGNOSIS — E1169 Type 2 diabetes mellitus with other specified complication: Secondary | ICD-10-CM | POA: Diagnosis not present

## 2018-02-23 DIAGNOSIS — I10 Essential (primary) hypertension: Secondary | ICD-10-CM | POA: Diagnosis not present

## 2018-02-23 DIAGNOSIS — K5909 Other constipation: Secondary | ICD-10-CM

## 2018-02-23 DIAGNOSIS — J4540 Moderate persistent asthma, uncomplicated: Secondary | ICD-10-CM

## 2018-02-23 DIAGNOSIS — E785 Hyperlipidemia, unspecified: Secondary | ICD-10-CM | POA: Diagnosis not present

## 2018-02-23 DIAGNOSIS — E119 Type 2 diabetes mellitus without complications: Secondary | ICD-10-CM

## 2018-02-23 DIAGNOSIS — D509 Iron deficiency anemia, unspecified: Secondary | ICD-10-CM | POA: Diagnosis not present

## 2018-02-23 NOTE — Progress Notes (Signed)
Careteam: Patient Care Team: Gildardo Cranker, DO as PCP - General (Internal Medicine)  Advanced Directive information    No Known Allergies  Chief Complaint  Patient presents with  . Medical Management of Chronic Issues    6 month follow-up, needs to schedule a CPX in the near future  . Health Maintenance    Per patient, thinks she had an eye exam at the beginning of the year   . Immunizations    Patient refused TDaP, will think about it      HPI: Patient is a 62 y.o. female seen in the office today for routine follow up.  AWV done on 02/11/18 which was reviewed.   HTN- on quinapril. Stable. , DM- currently on tradjenta daily, metformin twice daily. Does not take blood sugar at home "gotten lazy"  Hyperlipidemia- controlled on lipitor, attempts to make dietary changes. Does some walking and increase arm and legs exercises. 3-5 times a week.   Asthma- occasional will have shortness of breath. She takes singulair, Breo, prn albuterol HFA and astelin spray.will only need albuterol about every month. Following with Dr Ernst Bowler.  OA in left knee- no longer following with ortho- continues to have pain but has been stable. Taking Voltaren for pain.  Pt was previously on pain medication (oxycodone) which lead to constipation therefore she was placed on amitiza - now having diarrhea which started ~ 1 month ago.   Review of Systems:  Review of Systems  Constitutional: Negative for chills, fever and weight loss.  HENT: Negative for tinnitus.   Respiratory: Negative for cough, sputum production and shortness of breath.   Cardiovascular: Negative for chest pain, palpitations and leg swelling.  Gastrointestinal: Positive for abdominal pain and diarrhea. Negative for constipation and heartburn.  Genitourinary: Negative for dysuria, frequency and urgency.  Musculoskeletal: Positive for joint pain. Negative for back pain, falls and myalgias.  Skin: Negative.   Neurological: Negative  for dizziness and headaches.  Psychiatric/Behavioral: Negative for depression and memory loss. The patient does not have insomnia.     Past Medical History:  Diagnosis Date  . Asthma    QVAR daily and Albuterol as needed  . Bladder infection    taking Keflex daily   . Chronic back pain    reason unknown  . Diabetes mellitus    takes Metformin and Tradjenta daily  . GERD (gastroesophageal reflux disease)    takes Omeprazole daily  . Hard of hearing   . Headache    daily.Takes Excedrine daily  . History of blood transfusion    no abnormal reaction noted  . Hyperlipidemia    takes Atorvastatin daily  . Hypertension    takes Quinapril daily  . Joint pain   . Joint swelling   . Nocturia   . Osteoarthritis of left knee 02/23/2012  . Seasonal allergies    takes Claritin and Singulair daily.Nasal spray as needed  . Shortness of breath dyspnea    with exertion   Past Surgical History:  Procedure Laterality Date  . ABDOMINAL HYSTERECTOMY    . INJECTION KNEE  04/09/2012   Procedure: KNEE INJECTION;  Surgeon: Johnny Bridge, MD;  Location: [facility];  Service: Orthopedics;  Laterality: Left;  . JOINT REPLACEMENT     acl   bil knees  . KNEE ARTHROTOMY Left 08/28/2015   Procedure: LEFT KNEE ARTHROFIBROSIS EXCISION; pylectomy;  Surgeon: Meredith Pel, MD;  Location: [facility];  Service: Orthopedics;  Laterality: Left;  . KNEE  CLOSED REDUCTION  04/09/2012   Procedure: CLOSED MANIPULATION KNEE;  Surgeon: Johnny Bridge, MD;  Location: [facility];  Service: Orthopedics;  Laterality: Left;  Manipulation Knee with Anesthesia includes Application of Traction   . TOTAL KNEE ARTHROPLASTY  02/23/2012   Procedure: TOTAL KNEE ARTHROPLASTY;  Surgeon: Johnny Bridge, MD;  Location: [facility];  Service: Orthopedics;  Laterality: Left;   Social History:   reports that she has never smoked. She has never used smokeless tobacco. She reports that she does not drink alcohol  or use drugs.  Family History  Problem Relation Age of Onset  . Bronchitis Father 45  . Alzheimer's disease Mother   . Diabetes Daughter   . Diabetes Daughter   . Diabetes Unknown   . Colon cancer Neg Hx   . Esophageal cancer Neg Hx   . Rectal cancer Neg Hx   . Stomach cancer Neg Hx     Medications: Patient's Medications  New Prescriptions   No medications on file  Previous Medications   ALBUTEROL SULFATE (PROAIR RESPICLICK) 542 (90 BASE) MCG/ACT AEPB    Inhale 4 puffs into the lungs every 4 (four) hours as needed.   AMITIZA 24 MCG CAPSULE    TAKE 1 CAPSULE BY MOUTH TWICE A DAY WITH A MEAL   ASPIRIN-ACETAMINOPHEN-CAFFEINE (EXCEDRIN MIGRAINE) 250-250-65 MG PER TABLET    Take 2 tablets by mouth daily.   ATORVASTATIN (LIPITOR) 40 MG TABLET    TAKE 1 TABLET BY MOUTH EVERY DAY   AZELASTINE (ASTELIN) 0.1 % NASAL SPRAY    USE 1 SPRAY EACH NOSTRIL TWICE DAILY   BENZONATATE (TESSALON PERLES) 100 MG CAPSULE    Take 1 capsule (100 mg total) by mouth 3 (three) times daily as needed for cough.   DICLOFENAC SODIUM (VOLTAREN) 1 % GEL    Apply 8 g topically 2 (two) times daily as needed. On knees   FLUTICASONE (FLONASE) 50 MCG/ACT NASAL SPRAY    PLACE 2 SPRAYS INTO EACH NOSTRIL EVERY DAY   FLUTICASONE FUROATE-VILANTEROL (BREO ELLIPTA) 200-25 MCG/INH AEPB    Inhale 1 puff into the lungs daily.   IPRATROPIUM (ATROVENT) 0.03 % NASAL SPRAY    Place 2 sprays into both nostrils 2 (two) times daily.   LINAGLIPTIN (TRADJENTA) 5 MG TABS TABLET    Take 1 tablet (5 mg total) by mouth daily.   METFORMIN (GLUCOPHAGE) 500 MG TABLET    TAKE ONE TABLET BY MOUTH TWICE DAILY TO CONTROL BLOOD SUGAR   MONTELUKAST (SINGULAIR) 10 MG TABLET    TAKE 1 TABLET BY MOUTH EVERYDAY AT BEDTIME   OMEPRAZOLE (PRILOSEC) 40 MG CAPSULE    TAKE 1 CAPSULE BY MOUTH EVERY DAY   ONE TOUCH ULTRA TEST TEST STRIP    Check blood sugar once daily as directed   QUINAPRIL (ACCUPRIL) 20 MG TABLET    TAKE 1 TABLET BY MOUTH AT BEDTIME  Modified  Medications   No medications on file  Discontinued Medications   BACLOFEN (LIORESAL) 10 MG TABLET    Take 10 mg by mouth. 1 in morning 2 at bed time   IPRATROPIUM (ATROVENT) 0.03 % NASAL SPRAY    Place 2 sprays into both nostrils 2 (two) times daily.   OXYCODONE HCL 10 MG TABS    Take 1 tablet (10 mg total) by mouth 4 (four) times daily as needed.     Physical Exam:  Vitals:   02/23/18 1012  BP: 140/80  Pulse: 76  Temp: 98.5 F (36.9  C)  TempSrc: Oral  SpO2: 97%  Weight: 204 lb 6.4 oz (92.7 kg)  Height: 5\' 3"  (1.6 m)   Body mass index is 36.21 kg/m.  Physical Exam  Constitutional: She is oriented to person, place, and time. She appears well-developed and well-nourished.  HENT:  MMM; no oral thrush  Eyes: Pupils are equal, round, and reactive to light. No scleral icterus.  Neck: Neck supple. Carotid bruit is not present.  Cardiovascular: Normal rate, regular rhythm and intact distal pulses. Exam reveals no gallop and no friction rub.  Murmur (1/6 SEM) heard. No LE edema b/l. no calf TTP.   Pulmonary/Chest: Effort normal and breath sounds normal. No stridor. No respiratory distress.  Abdominal: Soft. Normal appearance and bowel sounds are normal. She exhibits no distension and no mass. There is no hepatomegaly. There is no tenderness. There is no rigidity, no rebound and no guarding. No hernia.  Musculoskeletal: She exhibits no edema.  Neurological: She is alert and oriented to person, place, and time. Gait (uses cane) abnormal.  Skin: Skin is warm and dry. No rash noted.  Psychiatric: She has a normal mood and affect. Her behavior is normal. Judgment and thought content normal.    Labs reviewed: Basic Metabolic Panel: Recent Labs    08/04/17 1120  NA 144  K 3.8  CL 104  CO2 33*  GLUCOSE 120*  BUN 10  CREATININE 0.78  CALCIUM 9.4   Liver Function Tests: Recent Labs    08/04/17 1120  AST 12  ALT 12  BILITOT 0.4  PROT 7.1   No results for input(s): LIPASE,  AMYLASE in the last 8760 hours. No results for input(s): AMMONIA in the last 8760 hours. CBC: Recent Labs    07/30/17 1138  WBC 3.6  NEUTROABS 2.3  HGB 10.4*  HCT 32.2*  MCV 80  PLT 243   Lipid Panel: Recent Labs    08/04/17 1120  CHOL 129  HDL 43*  LDLCALC 69  TRIG 91  CHOLHDL 3.0   TSH: No results for input(s): TSH in the last 8760 hours. A1C: Lab Results  Component Value Date   HGBA1C 6.4 (H) 08/04/2017     Assessment/Plan 1. Essential hypertension, benign Stable, encouraged dietary modifications, continue current regimen    2. Type 2 diabetes mellitus without complication, without long-term current use of insulin (HCC) -will continue current regimen and follow up labs today - Hemoglobin A1c - CBC with Differential/Platelets  3. Moderate persistent asthma in adult without complication Stable, will continue current regimen, cont to follow up with specialist.   4. Hyperlipidemia due to type 2 diabetes mellitus ([place]) LDL at goal on last labs, will continue Lipitor. Encouraged lifestyle modifications with diet and exercise.  - COMPLETE METABOLIC PANEL WITH GFR - Lipid Panel  5. Chronic constipation Resolved since off pain medication, now with diarrhea, still taking amitiza. Advised to stop medication and follow up with diarrhea persist.   Next appt: 6 months for EV with PAP Chaunda Vandergriff K. Zinc, [facility] [phone]

## 2018-02-23 NOTE — Patient Instructions (Addendum)
STOP amitiza   Continue to stay well hydrated  Continue exercise- 30 mins/5 days a week.   Will get lab work today

## 2018-02-24 ENCOUNTER — Encounter: Payer: Self-pay | Admitting: Nurse Practitioner

## 2018-02-24 DIAGNOSIS — D509 Iron deficiency anemia, unspecified: Secondary | ICD-10-CM | POA: Insufficient documentation

## 2018-02-25 LAB — LIPID PANEL
CHOL/HDL RATIO: 2.7 (calc) (ref <5.0)
CHOLESTEROL: 140 mg/dL (ref <200)
HDL: 52 mg/dL (ref >50)
LDL Cholesterol (Calc): 76 mg/dL (calc)
NON-HDL CHOLESTEROL (CALC): 88 mg/dL (ref <130)
Triglycerides: 41 mg/dL (ref <150)

## 2018-02-25 LAB — IRON,TIBC AND FERRITIN PANEL
%SAT: 17 % (calc) (ref 16–45)
FERRITIN: 11 ng/mL — AB (ref 16–288)
IRON: 57 ug/dL (ref 45–160)
TIBC: 331 mcg/dL (calc) (ref 250–450)

## 2018-02-25 LAB — TEST AUTHORIZATION

## 2018-02-25 LAB — COMPLETE METABOLIC PANEL WITH GFR
AG Ratio: 1.5 (calc) (ref 1.0–2.5)
ALBUMIN MSPROF: 4.1 g/dL (ref 3.6–5.1)
ALT: 8 U/L (ref 6–29)
AST: 12 U/L (ref 10–35)
Alkaline phosphatase (APISO): 119 U/L (ref 33–130)
BILIRUBIN TOTAL: 0.2 mg/dL (ref 0.2–1.2)
BUN: 9 mg/dL (ref 7–25)
CALCIUM: 9.4 mg/dL (ref 8.6–10.4)
CHLORIDE: 104 mmol/L (ref 98–110)
CO2: 25 mmol/L (ref 20–32)
Creat: 0.8 mg/dL (ref 0.50–0.99)
GFR, EST AFRICAN AMERICAN: 91 mL/min/{1.73_m2} (ref >=60)
GFR, EST NON AFRICAN AMERICAN: 78 mL/min/{1.73_m2} (ref >=60)
GLOBULIN: 2.7 g/dL (ref 1.9–3.7)
Glucose, Bld: 115 mg/dL (ref 65–139)
POTASSIUM: 4.2 mmol/L (ref 3.5–5.3)
SODIUM: 140 mmol/L (ref 135–146)
TOTAL PROTEIN: 6.8 g/dL (ref 6.1–8.1)

## 2018-02-25 LAB — CBC WITH DIFFERENTIAL/PLATELET
BASOS ABS: 57 {cells}/uL (ref 0–200)
Basophils Relative: 0.9 %
EOS ABS: 246 {cells}/uL (ref 15–500)
Eosinophils Relative: 3.9 %
HCT: 30.7 % — ABNORMAL LOW (ref 35.0–45.0)
HEMOGLOBIN: 10.1 g/dL — AB (ref 11.7–15.5)
Lymphs Abs: 1821 cells/uL (ref 850–3900)
MCH: 26 pg — AB (ref 27.0–33.0)
MCHC: 32.9 g/dL (ref 32.0–36.0)
MCV: 79.1 fL — ABNORMAL LOW (ref 80.0–100.0)
MONOS PCT: 4.9 %
MPV: 11.8 fL (ref 7.5–12.5)
NEUTROS ABS: 3868 {cells}/uL (ref 1500–7800)
NEUTROS PCT: 61.4 %
Platelets: 276 10*3/uL (ref 140–400)
RBC: 3.88 10*6/uL (ref 3.80–5.10)
RDW: 13.6 % (ref 11.0–15.0)
Total Lymphocyte: 28.9 %
WBC mixed population: 309 cells/uL (ref 200–950)
WBC: 6.3 10*3/uL (ref 3.8–10.8)

## 2018-02-25 LAB — HEMOGLOBIN A1C
Hgb A1c MFr Bld: 6.4 % of total Hgb — ABNORMAL HIGH (ref <5.7)
Mean Plasma Glucose: 137 (calc)
eAG (mmol/L): 7.6 (calc)

## 2018-02-26 ENCOUNTER — Telehealth: Payer: Self-pay

## 2018-02-26 DIAGNOSIS — D509 Iron deficiency anemia, unspecified: Secondary | ICD-10-CM

## 2018-02-26 MED ORDER — FERROUS SULFATE 325 (65 FE) MG PO TABS: 325.0000 mg | ORAL_TABLET | Freq: Every day | ORAL | 3 refills | Status: DC

## 2018-02-26 NOTE — Telephone Encounter (Signed)
Medication list was updated and rx was sent to pharmacy. IFOB test was ordered and kit was completed for patient to pick up.

## 2018-02-26 NOTE — Telephone Encounter (Signed)
-----  Message from [name], NP sent at 02/26/2018 10:12 AM EDT ----- Ferritin level low, lets have her complete an IFOB kit to see if there is blood in stool. To start iron supplement ferrous sulfate 325 mg by mouth daily at breakfast.

## 2018-03-04 ENCOUNTER — Other Ambulatory Visit: Payer: Self-pay | Admitting: Internal Medicine

## 2018-03-24 ENCOUNTER — Other Ambulatory Visit: Payer: Self-pay | Admitting: Nurse Practitioner

## 2018-04-27 ENCOUNTER — Encounter: Payer: Self-pay | Admitting: Allergy & Immunology

## 2018-04-27 ENCOUNTER — Ambulatory Visit (INDEPENDENT_AMBULATORY_CARE_PROVIDER_SITE_OTHER): Payer: Medicare Other | Admitting: Allergy & Immunology

## 2018-04-27 VITALS — BP 142/68 | HR 70 | Temp 98.1°F | Resp 20

## 2018-04-27 DIAGNOSIS — J302 Other seasonal allergic rhinitis: Secondary | ICD-10-CM | POA: Diagnosis not present

## 2018-04-27 DIAGNOSIS — J4540 Moderate persistent asthma, uncomplicated: Secondary | ICD-10-CM | POA: Diagnosis not present

## 2018-04-27 DIAGNOSIS — J3089 Other allergic rhinitis: Secondary | ICD-10-CM | POA: Diagnosis not present

## 2018-04-27 DIAGNOSIS — K219 Gastro-esophageal reflux disease without esophagitis: Secondary | ICD-10-CM

## 2018-04-27 NOTE — Patient Instructions (Addendum)
1. Moderate persistent asthma, uncomplicated - Lung testing looked GREAT today.  - We will not make any medication changes at this time. - Daily controller medication(s): Breo 200/34mcg one puff once daily - Prior to physical activity: ProAir 2 puffs 10-15 minutes before physical activity. - Rescue medications: ProAir 4 puffs every 4-6 hours as needed or albuterol nebulizer one vial every 4-6 hours as needed - Asthma control goals:  * Full participation in all desired activities (may need albuterol before activity) * Albuterol use two time or less a week on average (not counting use with activity) * Cough interfering with sleep two time or less a month * Oral steroids no more than once a year * No hospitalizations  2. Chronic rhinitis - Add on nasal ipratropium one spray per nostril every six hours or so to help with the runny nose (you can use this as needed).  - Continue with fluticasone nasal spray two sprays per nostril daily. - Continue with Singulair 10mg  daily.  3. GERD  - Ok to hold off on omeprazole since your symptoms are so well controlled.   4. Return in about 6 months (around 10/27/2018).    Please inform us of any Emergency Department visits, hospitalizations, or changes in symptoms. Call us before going to the ED for breathing or allergy symptoms since we might be able to fit you in for a sick visit. Feel free to contact us anytime with any questions, problems, or concerns.  It was a pleasure to see you again today!  Websites that have reliable patient information: 1. American Academy of Asthma, Allergy, and Immunology: www.aaaai.org 2. Food Allergy Research and Education (FARE): foodallergy.org 3. Mothers of Asthmatics: http://www.asthmacommunitynetwork.org 4. American College of Allergy, Asthma, and Immunology: MonthlyElectricBill.co.uk   Make sure you are registered to vote!

## 2018-04-27 NOTE — Progress Notes (Signed)
FOLLOW UP  Date of Service/Encounter:  04/27/18   Assessment:   Moderate persistent asthma without complication  Seasonal and perennial allergic rhinitis (trees, molds, dust mites)  Recurrent infections - with non-protective titers to Strep pneumoniae and Diphtheria (however, frequency of infections decreased, so we are holding on completing the workup, per patient request)  Complicated past medical history including left knee surgery and continued pain in her knees and her bilateral feet    Asthma Reportables:  Severity: moderate persistent  Risk: high Control: well controlled    Plan/Recommendations:   1. Moderate persistent asthma, uncomplicated - Lung testing looked GREAT today.  - We will not make any medication changes at this time. - Daily controller medication(s): Breo 200/76mcg one puff once daily - Prior to physical activity: ProAir 2 puffs 10-15 minutes before physical activity. - Rescue medications: ProAir 4 puffs every 4-6 hours as needed or albuterol nebulizer one vial every 4-6 hours as needed - Asthma control goals:  * Full participation in all desired activities (may need albuterol before activity) * Albuterol use two time or less a week on average (not counting use with activity) * Cough interfering with sleep two time or less a month * Oral steroids no more than once a year * No hospitalizations  2. Chronic rhinitis - Add on nasal ipratropium one spray per nostril every six hours or so to help with the runny nose (you can use this as needed).  - Continue with fluticasone nasal spray two sprays per nostril daily. - Continue with Singulair 10mg  daily.  3. GERD  - Ok to hold off on omeprazole since your symptoms are so well controlled.   4. Return in about 6 months (around 10/27/2018).   Subjective:   Tabitha Oneal is a 62 y.o. female presenting today for follow up of  Chief Complaint  Patient presents with  . Follow-up    Tabitha  CLAIRISSA Oneal has a history of the following: Patient Active Problem List   Diagnosis Date Noted  . Microcytic anemia 02/24/2018  . High risk medication use 08/06/2017  . Seasonal and perennial allergic rhinitis 07/30/2017  . Recurrent infections 07/30/2017  . Cough 06/08/2017  . Moderate persistent asthma without complication 40/98/1191  . Chronic constipation 02/04/2017  . Hyperlipidemia due to type 2 diabetes mellitus ([place]) 02/04/2017  . Hallux rigidus, left foot 01/01/2017  . Postmenopausal symptoms 02/22/2016  . Arthrofibrosis of knee joint 08/28/2015  . Left hip pain 10/08/2014  . Type 2 diabetes mellitus without complication, without long-term current use of insulin ([place]) 10/08/2014  . Hyperlipidemia LDL goal <100 10/08/2014  . Essential hypertension, benign 08/30/2014  . Diabetes mellitus, type 2 ([place]) 08/30/2014  . Diverticulosis of colon without hemorrhage 08/30/2014  . Chronic low back pain 08/30/2014  . Chronic pain syndrome 08/30/2014  . Asthma in adult without complication 47/82/9562  . 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 Osteoarthritis of left knee 02/23/2012    History obtained from: chart review and patient.  Tabitha Oneal's Primary Care Provider is [name], NP.     Tabitha Oneal is a 62 y.o. female presenting for a follow up visit. She was last seen in June 2019.  At that time, lung testing looked great.  We continued Breo 200/25 mcg 1 puff once daily.  Her rhinitis was controlled with Flonase as well as Singulair.  GERD was controlled with omeprazole.  She has had a work-up for recurrent infections in the  past and has been nonprotective to Streptococcus pneumonia and diphtheria.  However, we did not do additional testing since her infectious history seemed to have cleared up.  Since the last visit, she has done very well. She seems quite chipper today and has had no sick visits since the last visit.   Asthma/Respiratory  Symptom History: Since the last visit, she has mostly done well. She did have some wheezing last night. She did use her albuterol last night with improvement in her symptoms. Shimeka's asthma has been well controlled. She has not required rescue medication, experienced nocturnal awakenings due to lower respiratory symptoms, nor have activities of daily living been limited. She has required no Emergency Department or Urgent Care visits for her asthma. She has required zero courses of systemic steroids for asthma exacerbations since the last visit. ACT score today is 23, indicating excellent asthma symptom control.   Allergic Rhinitis Symptom History: She is having clear rhinorrhea for one or two weeks. She remains on the fluticasone but she does say that she other nasal sprays at home. She does use her montelukast. She denies sinus pressure and fevers.   Otherwise, there have been no changes to her past medical history, surgical history, family history, or social history.    Review of Systems: a 14-point review of systems is pertinent for what is mentioned in HPI.  Otherwise, all other systems were negative.  Constitutional: negative other than that listed in the HPI Eyes: negative other than that listed in the HPI Ears, nose, mouth, throat, and face: negative other than that listed in the HPI Respiratory: negative other than that listed in the HPI Cardiovascular: negative other than that listed in the HPI Gastrointestinal: negative other than that listed in the HPI Genitourinary: negative other than that listed in the HPI Integument: negative other than that listed in the HPI Hematologic: negative other than that listed in the HPI Musculoskeletal: negative other than that listed in the HPI Neurological: negative other than that listed in the HPI Allergy/Immunologic: negative other than that listed in the HPI    Objective:   Blood pressure (!) 142/68, pulse 70, temperature 98.1 F (36.7  C), temperature source Oral, resp. rate 20, SpO2 96 %. There is no height or weight on file to calculate BMI.   Physical Exam:  General: Alert, interactive, in no acute distress. Talkative. Eyes: No conjunctival injection bilaterally, no discharge on the right, no discharge on the left and no Horner-Trantas dots present. PERRL bilaterally. EOMI without pain. No photophobia.  Ears: Right TM pearly gray with normal light reflex, Left TM pearly gray with normal light reflex, Right TM intact without perforation and Left TM intact without perforation.  Nose/Throat: External nose within normal limits and septum midline. Turbinates edematous without discharge. Posterior oropharynx mildly erythematous without cobblestoning in the posterior oropharynx. Tonsils 2+ without exudates.  Tongue without thrush. Lungs: Clear to auscultation without wheezing, rhonchi or rales. No increased work of breathing. CV: Normal S1/S2. No murmurs. Capillary refill <2 seconds.  Skin: Warm and dry, without lesions or rashes. Neuro:   Grossly intact. No focal deficits appreciated. Responsive to questions.  Diagnostic studies:   Spirometry: results normal (FEV1: 1.87/97%, FVC: 2.41/98%, FEV1/FVC: 78%).    Spirometry consistent with normal pattern.  Allergy Studies: none        Salvatore Marvel, MD  [facility]

## 2018-04-29 ENCOUNTER — Ambulatory Visit: Payer: Medicare HMO | Admitting: Allergy & Immunology

## 2018-05-04 ENCOUNTER — Other Ambulatory Visit: Payer: Self-pay | Admitting: *Deleted

## 2018-05-04 MED ORDER — LINAGLIPTIN 5 MG PO TABS: 5.0000 mg | ORAL_TABLET | Freq: Every day | ORAL | 1 refills | Status: DC

## 2018-05-04 NOTE — Telephone Encounter (Signed)
[facility] 

## 2018-06-09 ENCOUNTER — Other Ambulatory Visit: Payer: Self-pay | Admitting: Allergy & Immunology

## 2018-06-17 ENCOUNTER — Other Ambulatory Visit: Payer: Self-pay | Admitting: *Deleted

## 2018-06-17 MED ORDER — ATORVASTATIN CALCIUM 40 MG PO TABS: 40.0000 mg | ORAL_TABLET | Freq: Every day | ORAL | 1 refills | Status: DC

## 2018-06-17 MED ORDER — QUINAPRIL HCL 20 MG PO TABS: 20.0000 mg | ORAL_TABLET | Freq: Every day | ORAL | 1 refills | Status: DC

## 2018-06-17 MED ORDER — AMITIZA 24 MCG PO CAPS: ORAL_CAPSULE | ORAL | 1 refills | Status: DC

## 2018-06-17 NOTE — Telephone Encounter (Signed)
[facility] 

## 2018-07-28 DIAGNOSIS — Z79899 Other long term (current) drug therapy: Secondary | ICD-10-CM | POA: Diagnosis not present

## 2018-08-25 ENCOUNTER — Other Ambulatory Visit: Payer: Self-pay

## 2018-08-25 MED ORDER — IPRATROPIUM BROMIDE 0.03 % NA SOLN: 2.0000 | Freq: Two times a day (BID) | NASAL | 0 refills | Status: DC

## 2018-08-26 ENCOUNTER — Encounter: Payer: Medicare Other | Admitting: Nurse Practitioner

## 2018-08-26 ENCOUNTER — Ambulatory Visit: Payer: Medicare Other | Admitting: Nurse Practitioner

## 2018-09-03 ENCOUNTER — Other Ambulatory Visit: Payer: Self-pay | Admitting: Nurse Practitioner

## 2018-09-06 NOTE — Telephone Encounter (Signed)
Next appointment 09/30/2018, additional refills to be given at that time

## 2018-09-23 ENCOUNTER — Other Ambulatory Visit: Payer: Self-pay

## 2018-09-23 ENCOUNTER — Ambulatory Visit (INDEPENDENT_AMBULATORY_CARE_PROVIDER_SITE_OTHER): Payer: Medicare Other | Admitting: Nurse Practitioner

## 2018-09-23 DIAGNOSIS — M1712 Unilateral primary osteoarthritis, left knee: Secondary | ICD-10-CM

## 2018-09-23 DIAGNOSIS — I10 Essential (primary) hypertension: Secondary | ICD-10-CM

## 2018-09-23 DIAGNOSIS — J4540 Moderate persistent asthma, uncomplicated: Secondary | ICD-10-CM | POA: Diagnosis not present

## 2018-09-23 DIAGNOSIS — J302 Other seasonal allergic rhinitis: Secondary | ICD-10-CM

## 2018-09-23 DIAGNOSIS — E119 Type 2 diabetes mellitus without complications: Secondary | ICD-10-CM

## 2018-09-23 DIAGNOSIS — E785 Hyperlipidemia, unspecified: Secondary | ICD-10-CM

## 2018-09-23 DIAGNOSIS — J3089 Other allergic rhinitis: Secondary | ICD-10-CM

## 2018-09-23 DIAGNOSIS — D509 Iron deficiency anemia, unspecified: Secondary | ICD-10-CM

## 2018-09-23 NOTE — Progress Notes (Signed)
This service is provided via telemedicine  No vital signs collected/recorded due to the encounter was a telemedicine visit.   Location of patient (ex: home, work): Home  Patient consents to a telephone/virtual  visit:  Yes  Location of the provider (ex: office, home): [facility], Office   Name of any referring provider:  N/A  Names of all persons participating in the telemedicine service and their role in the encounter:  S.Chrae B/CMA, Sherrie Mustache, NP, and Patient   Time spent on call:  8 min with medical assistant   Virtual Visit via Video Note  I connected with Tabitha Oneal on 09/23/18 at  2:15 PM EDT by a video enabled telemedicine application and verified that I am speaking with the correct person using two identifiers.  Location: Patient: home Provider: office    I discussed the limitations of evaluation and management by telemedicine and the availability of in person appointments. The patient expressed understanding and agreed to proceed.      Careteam: Patient Care Team: [name], NP as PCP - General (Geriatric Medicine)  Advanced Directive information Does Patient Have a Medical Advance Directive?: No, Would patient like information on creating a medical advance directive?: Yes (MAU/Ambulatory/Procedural Areas - Information given)(Paper work given at previous appointment)  No Known Allergies  Chief Complaint  Patient presents with  . Medical Management of Chronic Issues    6 month follow-up  . Medication Management    Discuss Amitiza, not taking for a long time and questions if she should   . Best Practice Recommendations    Discuss need for HIV screening. Patient will schedule eye exam when pandemic is over.      HPI: Patient is a 63 y.o. female for routine follow up.   Anemia- using ferrous sulfate daily  HTN- on quinapril but does not take blood pressure at home. , DM- A1c 6.4 on 10/1 currently on tradjenta daily,  metformin twice daily.  Reports she is eating too much junk food. Only eating 1 meal a day due to eating junk.  Previously was better.   Hyperlipidemia- LDL 76 on 10/1; on lipitor, has been eating poorly recently and not exercising due to COVID -19 restrictions.   Asthma- denies any shortness of breath. She takes singulair, Breo, prn albuterol HFA and astelin spray.will only need albuterol about every month. Following with Dr Ernst Bowler.  Seasonal allergies- increase cough, nasal congestion, sneezing due to allergies, taking claritin 10 mg daily which has been helpful. Uses atrovent nasal spray- having sinus headache.   OA in left knee- no longer following with ortho- continues to have pain but has been stable. Taking Voltaren for pain. Using baclofen  Started taking omega 3s for joints and heart.   Review of Systems:  Review of Systems  Constitutional: Negative for chills, fever and weight loss.  HENT: Negative for tinnitus.   Respiratory: Negative for cough, sputum production and shortness of breath.   Cardiovascular: Negative for chest pain, palpitations and leg swelling.  Gastrointestinal: Negative for abdominal pain, constipation, diarrhea and heartburn.  Genitourinary: Negative for dysuria, frequency and urgency.  Musculoskeletal: Positive for joint pain. Negative for back pain, falls and myalgias.  Skin: Negative.   Neurological: Positive for headaches. Negative for dizziness.  Psychiatric/Behavioral: Negative for depression and memory loss. The patient does not have insomnia.     Past Medical History:  Diagnosis Date  . Asthma    QVAR daily and Albuterol as needed  .  Bladder infection    taking Keflex daily   . Chronic back pain    reason unknown  . Diabetes mellitus    takes Metformin and Tradjenta daily  . GERD (gastroesophageal reflux disease)    takes Omeprazole daily  . Hard of hearing   . Headache    daily.Takes Excedrine daily  . History of blood  transfusion    no abnormal reaction noted  . Hyperlipidemia    takes Atorvastatin daily  . Hypertension    takes Quinapril daily  . Joint pain   . Joint swelling   . Nocturia   . Osteoarthritis of left knee 02/23/2012  . Seasonal allergies    takes Claritin and Singulair daily.Nasal spray as needed  . Shortness of breath dyspnea    with exertion   Past Surgical History:  Procedure Laterality Date  . ABDOMINAL HYSTERECTOMY    . INJECTION KNEE  04/09/2012   Procedure: KNEE INJECTION;  Surgeon: Johnny Bridge, MD;  Location: [place];  Service: Orthopedics;  Laterality: Left;  . JOINT REPLACEMENT     acl   bil knees  . KNEE ARTHROTOMY Left 08/28/2015   Procedure: LEFT KNEE ARTHROFIBROSIS EXCISION; pylectomy;  Surgeon: Meredith Pel, MD;  Location: [place];  Service: Orthopedics;  Laterality: Left;  . KNEE CLOSED REDUCTION  04/09/2012   Procedure: CLOSED MANIPULATION KNEE;  Surgeon: Johnny Bridge, MD;  Location: [place];  Service: Orthopedics;  Laterality: Left;  Manipulation Knee with Anesthesia includes Application of Traction   . TOTAL KNEE ARTHROPLASTY  02/23/2012   Procedure: TOTAL KNEE ARTHROPLASTY;  Surgeon: Johnny Bridge, MD;  Location: [place];  Service: Orthopedics;  Laterality: Left;   Social History:   reports that she has never smoked. She has never used smokeless tobacco. She reports that she does not drink alcohol or use drugs.  Family History  Problem Relation Age of Onset  . Bronchitis Father 49  . Alzheimer's disease Mother   . Diabetes Daughter   . Diabetes Daughter   . Diabetes Other   . Colon cancer Neg Hx   . Esophageal cancer Neg Hx   . Rectal cancer Neg Hx   . Stomach cancer Neg Hx     Medications: Patient's Medications  New Prescriptions   No medications on file  Previous Medications   ALBUTEROL SULFATE (PROAIR RESPICLICK) 270 (90 BASE) MCG/ACT AEPB    Inhale 4 puffs into the lungs every 4 (four) hours as  needed.   AMITIZA 24 MCG CAPSULE    Take one capsule by mouth twice daily with a meal   ASPIRIN-ACETAMINOPHEN-CAFFEINE (EXCEDRIN MIGRAINE) 250-250-65 MG PER TABLET    Take 2 tablets by mouth daily.   ATORVASTATIN (LIPITOR) 40 MG TABLET    Take 1 tablet (40 mg total) by mouth daily.   AZELASTINE (ASTELIN) 0.1 % NASAL SPRAY    USE 1 SPRAY EACH NOSTRIL TWICE DAILY   DICLOFENAC SODIUM (VOLTAREN) 1 % GEL    Apply 8 g topically 2 (two) times daily as needed. On knees   FERROUS SULFATE 325 (65 FE) MG TABLET    TAKE 1 TABLET BY MOUTH EVERY DAY WITH BREAKFAST   FLUTICASONE (FLONASE) 50 MCG/ACT NASAL SPRAY    PLACE 2 SPRAYS INTO EACH NOSTRIL EVERY DAY   FLUTICASONE FUROATE-VILANTEROL (BREO ELLIPTA) 200-25 MCG/INH AEPB    Inhale 1 puff into the lungs daily.   IPRATROPIUM (ATROVENT) 0.03 % NASAL SPRAY    Place 2  sprays into both nostrils 2 (two) times daily.   LINAGLIPTIN (TRADJENTA) 5 MG TABS TABLET    Take 1 tablet (5 mg total) by mouth daily.   METFORMIN (GLUCOPHAGE) 500 MG TABLET    TAKE ONE TABLET BY MOUTH TWICE DAILY TO CONTROL BLOOD SUGAR   MONTELUKAST (SINGULAIR) 10 MG TABLET    TAKE 1 TABLET BY MOUTH EVERYDAY AT BEDTIME   OMEPRAZOLE (PRILOSEC) 40 MG CAPSULE    TAKE 1 CAPSULE BY MOUTH EVERY DAY   ONE TOUCH ULTRA TEST TEST STRIP    Check blood sugar once daily as directed   QUINAPRIL (ACCUPRIL) 20 MG TABLET    Take 1 tablet (20 mg total) by mouth at bedtime.  Modified Medications   No medications on file  Discontinued Medications   BENZONATATE (TESSALON PERLES) 100 MG CAPSULE    Take 1 capsule (100 mg total) by mouth 3 (three) times daily as needed for cough.     Physical Exam: Unable due to tele-visit.   Labs reviewed: Basic Metabolic Panel: Recent Labs    02/23/18 1109  NA 140  K 4.2  CL 104  CO2 25  GLUCOSE 115  BUN 9  CREATININE 0.80  CALCIUM 9.4   Liver Function Tests: Recent Labs    02/23/18 1109  AST 12  ALT 8  BILITOT 0.2  PROT 6.8   No results for input(s):  LIPASE, AMYLASE in the last 8760 hours. No results for input(s): AMMONIA in the last 8760 hours. CBC: Recent Labs    02/23/18 1109  WBC 6.3  NEUTROABS 3,868  HGB 10.1*  HCT 30.7*  MCV 79.1*  PLT 276   Lipid Panel: Recent Labs    02/23/18 1109  CHOL 140  HDL 52  LDLCALC 76  TRIG 41  CHOLHDL 2.7   TSH: No results for input(s): TSH in the last 8760 hours. A1C: Lab Results  Component Value Date   HGBA1C 6.4 (H) 02/23/2018     Assessment/Plan 1. Primary osteoarthritis of left knee -ongoing. To use tylenol 325 mg 1-2 tablets every 8 hours as needed. To not exceed 3 gm tylenol in 24 hours  2. Microcytic anemia -continues on iron supplement, will follow up lab - CBC with Differential/Platelet; Future - Iron and TIBC; Future  3. Moderate persistent asthma without complication Stable on current regimen, no increase in cough, chest congestion or shortness of breath.   4. Type 2 diabetes mellitus without complication, without long-term current use of insulin ([place]) -encouraged dietary modifications (eating more since she has been at home). Encouraged to eat 3 meals a day with proper protein and not to excessively snack on junk food.  -continues on metformin and tradjenta. Encouraged  routine foot care/monitoring and to keep up with diabetic eye exams through ophthalmology   - Hemoglobin A1c; Future  5. Hyperlipidemia LDL goal <100 Continues on lipitor 40 mg daily, encouraged proper eating habits and to increase physical activity.   6. Essential hypertension, benign Has been stable in the past, borderline on the last OV, encouraged diet modifications. To check blood pressure at home when able goal <140/90 - COMPLETE METABOLIC PANEL WITH GFR; Future - Lipid Panel; Future  7. Seasonal allergies Worse during this time of year. Encouraged to use nettipot daily Continues on loratadine, astelin and flonase nasal spray  Next appt: 01/24/2019  Carlos American. Harle Battiest   [facility] [phone]   Follow Up Instructions:    I discussed the assessment and treatment  plan with the patient. The patient was provided an opportunity to ask questions and all were answered. The patient agreed with the plan and demonstrated an understanding of the instructions.   The patient was advised to call back or seek an in-person evaluation if the symptoms worsen or if the condition fails to improve as anticipated.  I provided 26 minutes of non-face-to-face time during this encounter.  avs printed and mailed.  [name], NP

## 2018-09-23 NOTE — Patient Instructions (Addendum)
START tylenol (acetaminophen) 325 mg by mouth 2 tablets every 8 hours as needed for pain.  To use nettipot daily (or sinus wash)  To notify if headache worsens- appears to be related to sinuses   Stop snacking/eating junk food. To make diet changes and focus on healthy eating   DASH Eating Plan DASH stands for "Dietary Approaches to Stop Hypertension." The DASH eating plan is a healthy eating plan that has been shown to reduce high blood pressure (hypertension). It may also reduce your risk for type 2 diabetes, heart disease, and stroke. The DASH eating plan may also help with weight loss. What are tips for following this plan?  General guidelines  Avoid eating more than 2,300 mg (milligrams) of salt (sodium) a day. If you have hypertension, you may need to reduce your sodium intake to 1,500 mg a day.  Limit alcohol intake to no more than 1 drink a day for nonpregnant women and 2 drinks a day for men. One drink equals 12 oz of beer, 5 oz of wine, or 1 oz of hard liquor.  Work with your health care provider to maintain a healthy body weight or to lose weight. Ask what an ideal weight is for you.  Get at least 30 minutes of exercise that causes your heart to beat faster (aerobic exercise) most days of the week. Activities may include walking, swimming, or biking.  Work with your health care provider or diet and nutrition specialist (dietitian) to adjust your eating plan to your individual calorie needs. Reading food labels   Check food labels for the amount of sodium per serving. Choose foods with less than 5 percent of the Daily Value of sodium. Generally, foods with less than 300 mg of sodium per serving fit into this eating plan.  To find whole grains, look for the word "whole" as the first word in the ingredient list. Shopping  Buy products labeled as "low-sodium" or "no salt added."  Buy fresh foods. Avoid canned foods and premade or frozen meals. Cooking  Avoid adding salt  when cooking. Use salt-free seasonings or herbs instead of table salt or sea salt. Check with your health care provider or pharmacist before using salt substitutes.  Do not fry foods. Cook foods using healthy methods such as baking, boiling, grilling, and broiling instead.  Cook with heart-healthy oils, such as olive, canola, soybean, or sunflower oil. Meal planning  Eat a balanced diet that includes: ? 5 or more servings of fruits and vegetables each day. At each meal, try to fill half of your plate with fruits and vegetables. ? Up to 6-8 servings of whole grains each day. ? Less than 6 oz of lean meat, poultry, or fish each day. A 3-oz serving of meat is about the same size as a deck of cards. One egg equals 1 oz. ? 2 servings of low-fat dairy each day. ? A serving of nuts, seeds, or beans 5 times each week. ? Heart-healthy fats. Healthy fats called Omega-3 fatty acids are found in foods such as flaxseeds and coldwater fish, like sardines, salmon, and mackerel.  Limit how much you eat of the following: ? Canned or prepackaged foods. ? Food that is high in trans fat, such as fried foods. ? Food that is high in saturated fat, such as fatty meat. ? Sweets, desserts, sugary drinks, and other foods with added sugar. ? Full-fat dairy products.  Do not salt foods before eating.  Try to eat at least 2 vegetarian  meals each week.  Eat more home-cooked food and less restaurant, buffet, and fast food.  When eating at a restaurant, ask that your food be prepared with less salt or no salt, if possible. What foods are recommended? The items listed may not be a complete list. Talk with your dietitian about what dietary choices are best for you. Grains Whole-grain or whole-wheat bread. Whole-grain or whole-wheat pasta. Brown rice. [place] Morrow. Bulgur. Whole-grain and low-sodium cereals. Pita bread. Low-fat, low-sodium crackers. Whole-wheat flour tortillas. Vegetables Fresh or frozen  vegetables (raw, steamed, roasted, or grilled). Low-sodium or reduced-sodium tomato and vegetable juice. Low-sodium or reduced-sodium tomato sauce and tomato paste. Low-sodium or reduced-sodium canned vegetables. Fruits All fresh, dried, or frozen fruit. Canned fruit in natural juice (without added sugar). Meat and other protein foods Skinless chicken or [place]. Ground chicken or [place]. Pork with fat trimmed off. Fish and seafood. Egg whites. Dried beans, peas, or lentils. Unsalted nuts, nut butters, and seeds. Unsalted canned beans. Lean cuts of beef with fat trimmed off. Low-sodium, lean deli meat. Dairy Low-fat (1%) or fat-free (skim) milk. Fat-free, low-fat, or reduced-fat cheeses. Nonfat, low-sodium ricotta or cottage cheese. Low-fat or nonfat yogurt. Low-fat, low-sodium cheese. Fats and oils Soft margarine without trans fats. Vegetable oil. Low-fat, reduced-fat, or light mayonnaise and salad dressings (reduced-sodium). Canola, safflower, olive, soybean, and sunflower oils. Avocado. Seasoning and other foods Herbs. Spices. Seasoning mixes without salt. Unsalted popcorn and pretzels. Fat-free sweets. What foods are not recommended? The items listed may not be a complete list. Talk with your dietitian about what dietary choices are best for you. Grains Baked goods made with fat, such as croissants, muffins, or some breads. Dry pasta or rice meal packs. Vegetables Creamed or fried vegetables. Vegetables in a cheese sauce. Regular canned vegetables (not low-sodium or reduced-sodium). Regular canned tomato sauce and paste (not low-sodium or reduced-sodium). Regular tomato and vegetable juice (not low-sodium or reduced-sodium). Angie Fava. Olives. Fruits Canned fruit in a light or heavy syrup. Fried fruit. Fruit in cream or butter sauce. Meat and other protein foods Fatty cuts of meat. Ribs. Fried meat. Berniece Salines. Sausage. Bologna and other processed lunch meats. Salami. Fatback. Hotdogs. Bratwurst.  Salted nuts and seeds. Canned beans with added salt. Canned or smoked fish. Whole eggs or egg yolks. Chicken or [place] with skin. Dairy Whole or 2% milk, cream, and half-and-half. Whole or full-fat cream cheese. Whole-fat or sweetened yogurt. Full-fat cheese. Nondairy creamers. Whipped toppings. Processed cheese and cheese spreads. Fats and oils Butter. Stick margarine. Lard. Shortening. Ghee. Bacon fat. Tropical oils, such as coconut, palm kernel, or palm oil. Seasoning and other foods Salted popcorn and pretzels. Onion salt, garlic salt, seasoned salt, table salt, and sea salt. Worcestershire sauce. Tartar sauce. Barbecue sauce. Teriyaki sauce. Soy sauce, including reduced-sodium. Steak sauce. Canned and packaged gravies. Fish sauce. Oyster sauce. Cocktail sauce. Horseradish that you find on the shelf. Ketchup. Mustard. Meat flavorings and tenderizers. Bouillon cubes. Hot sauce and Tabasco sauce. Premade or packaged marinades. Premade or packaged taco seasonings. Relishes. Regular salad dressings. Where to find more information:  National Heart, Lung, and [place]: https://wilson-eaton.com/  American Heart Association: www.heart.org Summary  The DASH eating plan is a healthy eating plan that has been shown to reduce high blood pressure (hypertension). It may also reduce your risk for type 2 diabetes, heart disease, and stroke.  With the DASH eating plan, you should limit salt (sodium) intake to 2,300 mg a day. If you have hypertension, you may need to  reduce your sodium intake to 1,500 mg a day.  When on the DASH eating plan, aim to eat more fresh fruits and vegetables, whole grains, lean proteins, low-fat dairy, and heart-healthy fats.  Work with your health care provider or diet and nutrition specialist (dietitian) to adjust your eating plan to your individual calorie needs. This information is not intended to replace advice given to you by your health care provider. Make sure you discuss any  questions you have with your health care provider. Document Released: 05/01/2011 Document Revised: 05/05/2016 Document Reviewed: 05/05/2016 Elsevier Interactive Patient Education  2019 [employer].

## 2018-09-28 ENCOUNTER — Other Ambulatory Visit: Payer: Self-pay | Admitting: Nurse Practitioner

## 2018-09-30 ENCOUNTER — Ambulatory Visit: Payer: Self-pay | Admitting: Nurse Practitioner

## 2018-10-25 ENCOUNTER — Other Ambulatory Visit: Payer: Self-pay | Admitting: Nurse Practitioner

## 2018-11-02 ENCOUNTER — Ambulatory Visit: Payer: Medicare Other | Admitting: Allergy & Immunology

## 2018-11-11 ENCOUNTER — Other Ambulatory Visit: Payer: Self-pay | Admitting: Family Medicine

## 2018-11-11 ENCOUNTER — Other Ambulatory Visit: Payer: Self-pay | Admitting: Internal Medicine

## 2018-11-11 DIAGNOSIS — Z1231 Encounter for screening mammogram for malignant neoplasm of breast: Secondary | ICD-10-CM

## 2018-11-14 ENCOUNTER — Other Ambulatory Visit: Payer: Self-pay | Admitting: Allergy & Immunology

## 2018-11-22 ENCOUNTER — Other Ambulatory Visit: Payer: Self-pay | Admitting: Nurse Practitioner

## 2018-12-01 ENCOUNTER — Other Ambulatory Visit: Payer: Self-pay

## 2018-12-01 ENCOUNTER — Encounter: Payer: Self-pay | Admitting: Allergy & Immunology

## 2018-12-01 ENCOUNTER — Ambulatory Visit (INDEPENDENT_AMBULATORY_CARE_PROVIDER_SITE_OTHER): Payer: Medicare Other | Admitting: Allergy & Immunology

## 2018-12-01 DIAGNOSIS — J3089 Other allergic rhinitis: Secondary | ICD-10-CM | POA: Diagnosis not present

## 2018-12-01 DIAGNOSIS — J4540 Moderate persistent asthma, uncomplicated: Secondary | ICD-10-CM | POA: Diagnosis not present

## 2018-12-01 DIAGNOSIS — J302 Other seasonal allergic rhinitis: Secondary | ICD-10-CM

## 2018-12-01 DIAGNOSIS — K219 Gastro-esophageal reflux disease without esophagitis: Secondary | ICD-10-CM

## 2018-12-01 MED ORDER — AZELASTINE HCL 0.1 % NA SOLN: NASAL | 2 refills | Status: DC

## 2018-12-01 MED ORDER — BREO ELLIPTA 200-25 MCG/INH IN AEPB: 1.0000 | INHALATION_SPRAY | Freq: Every day | RESPIRATORY_TRACT | 5 refills | Status: DC

## 2018-12-01 MED ORDER — FLUTICASONE PROPIONATE 50 MCG/ACT NA SUSP: 2.0000 | Freq: Every day | NASAL | 2 refills | Status: DC

## 2018-12-01 MED ORDER — IPRATROPIUM BROMIDE 0.03 % NA SOLN: 2.0000 | Freq: Two times a day (BID) | NASAL | 1 refills | Status: DC

## 2018-12-01 MED ORDER — PROAIR RESPICLICK 108 (90 BASE) MCG/ACT IN AEPB: 4.0000 | INHALATION_SPRAY | RESPIRATORY_TRACT | 1 refills | Status: DC | PRN

## 2018-12-01 MED ORDER — MONTELUKAST SODIUM 10 MG PO TABS: ORAL_TABLET | ORAL | 5 refills | Status: DC

## 2018-12-01 NOTE — Patient Instructions (Addendum)
1. Moderate persistent asthma, uncomplicated - It seems that your asthma medications are working well at this time.  - We will not make any medication changes. - Daily controller medication(s): Breo 200/82mcg one puff once daily - Prior to physical activity: ProAir 2 puffs 10-15 minutes before physical activity. - Rescue medications: ProAir 4 puffs every 4-6 hours as needed or albuterol nebulizer one vial every 4-6 hours as needed - Asthma control goals:  * Full participation in all desired activities (may need albuterol before activity) * Albuterol use two time or less a week on average (not counting use with activity) * Cough interfering with sleep two time or less a month * Oral steroids no more than once a year * No hospitalizations  2. Chronic rhinitis - Continue with nasal ipratropium one spray per nostril every six hours or so to help with the runny nose (you can use this as needed).  - Continue with fluticasone nasal spray two sprays per nostril daily. - Continue with Singulair 10mg  daily.  3. GERD  - Controlled without reflux medications.   4. Return in about 6 months (around 06/03/2019). This can be an in-person, a virtual Webex or a telephone follow up visit.   Please inform us of any Emergency Department visits, hospitalizations, or changes in symptoms. Call us before going to the ED for breathing or allergy symptoms since we might be able to fit you in for a sick visit. Feel free to contact us anytime with any questions, problems, or concerns.  It was a pleasure to talk to you today today!  Websites that have reliable patient information: 1. American Academy of Asthma, Allergy, and Immunology: www.aaaai.org 2. Food Allergy Research and Education (FARE): foodallergy.org 3. Mothers of Asthmatics: http://www.asthmacommunitynetwork.org 4. American College of Allergy, Asthma, and Immunology: www.acaai.org  "Like" [place] on Facebook and Instagram for our latest updates!       Make sure you are registered to vote! If you have moved or changed any of your contact information, you will need to get this updated before voting!  In some cases, you MAY be able to register to vote online: [email or web address]    Voter ID laws are NOT going into effect for the General Election in November 2020! DO NOT let this stop you from exercising your right to vote!   Absentee voting is the SAFEST way to vote during the coronavirus pandemic!   Download and print an absentee ballot request form at [email or web address] or you can scan the QR code below with your smart phone:      More information on absentee ballots can be found here: [email or web address]

## 2018-12-01 NOTE — Progress Notes (Signed)
Start: 1120 Location: Home End [address]

## 2018-12-01 NOTE — Progress Notes (Signed)
RE: NOHELANI BENNING MRN: [id number] DOB: 14-Jul-1955 Date of Telemedicine Visit: 12/01/2018  Referring provider: [name], NP Primary care provider: [name], NP  Chief Complaint: Asthma   Telemedicine Follow Up Visit via Telephone: I connected with Fabio Asa for a follow up on 12/01/18 by telephone and verified that I am speaking with the correct person using two identifiers.   I discussed the limitations, risks, security and privacy concerns of performing an evaluation and management service by telephone and the availability of in person appointments. I also discussed with the patient that there may be a patient responsible charge related to this service. The patient expressed understanding and agreed to proceed.  Patient is at home.  Provider is at the office.  Visit start time: 11:21 AM Visit end time: 11:44 AM Insurance consent/check in by: [place] consent and medical assistant/nurse: Garlon Hatchet  History of Present Illness:  Naasia is a 63 y.o. female presenting for a follow up visit.  She was last seen in December 2019.  At that time, her lung testing looked great.  We continued Breo 200/25 mcg 1 puff once daily as well as albuterol as needed.  For her rhinitis, we added on nasal ipratropium to use as needed.  We also continue with Flonase and Singulair.  We stopped her omeprazole since her reflux seems well controlled.  Since the last visit, she has done well. She did have some problems using her AC during the summer.   Asthma/Respiratory Symptom History: She remains on the Breo one puff daily. She does report good sleep at night. She does wake up refreshed. She has not needed the ED or UC for her breathing. She does not remember the last time that she needed prednisone.   Allergic Rhinitis Symptom History: She did have a postnasal drip with rhinorrhea when the [facility] was first started. She has not needed antibiotics for her sinuses.   She is  remaining safe during the [place] pandemic. She has been remaining inside, going out only for gas or groceries. She ist working on losing weight and has been doing some exercises in her home. She does not feel safe walking around her neighborhood, unfortunately.   She is planning to vote by mail but did not realize that she needed to request an absentee ballot. She was under the impression that everyone was going to get an absentee ballot sent to them. I did provide information on how to request an absentee ballot.    Assessment and Plan:  Asmara is a 63 y.o. female with:  Moderate persistent asthma without complication  Seasonal and perennial allergic rhinitis (trees, mold, dust mite)  Gastroesophageal reflux disease   1. Moderate persistent asthma, uncomplicated - It seems that your asthma medications are working well at this time.  - We will not make any medication changes. - Daily controller medication(s): Breo 200/11mcg one puff once daily - Prior to physical activity: ProAir 2 puffs 10-15 minutes before physical activity. - Rescue medications: ProAir 4 puffs every 4-6 hours as needed or albuterol nebulizer one vial every 4-6 hours as needed - Asthma control goals:  * Full participation in all desired activities (may need albuterol before activity) * Albuterol use two time or less a week on average (not counting use with activity) * Cough interfering with sleep two time or less a month * Oral steroids no more than once a year * No hospitalizations  2. Chronic rhinitis - Continue with nasal ipratropium one  spray per nostril every six hours or so to help with the runny nose (you can use this as needed).  - Continue with fluticasone nasal spray two sprays per nostril daily. - Continue with Singulair 10mg  daily.  3. GERD  - Controlled without reflux medications.   4. Return in about 6 months (around 06/03/2019). This can be an in-person, a virtual Webex or a telephone follow up  visit.    Diagnostics: None.  Medication List:  Current Outpatient Medications  Medication Sig Dispense Refill  . Albuterol Sulfate (PROAIR RESPICLICK) 478 (90 Base) MCG/ACT AEPB Inhale 4 puffs into the lungs every 4 (four) hours as needed. 1 each 1  . aspirin-acetaminophen-caffeine (EXCEDRIN MIGRAINE) 295-621-30 MG per tablet Take 2 tablets by mouth daily.    Marland Kitchen atorvastatin (LIPITOR) 40 MG tablet Take 1 tablet (40 mg total) by mouth daily. 90 tablet 1  . azelastine (ASTELIN) 0.1 % nasal spray USE 1 SPRAY EACH NOSTRIL TWICE DAILY 30 mL 2  . diclofenac sodium (VOLTAREN) 1 % GEL Apply 8 g topically 2 (two) times daily as needed. On knees    . ferrous sulfate 325 (65 FE) MG tablet TAKE 1 TABLET BY MOUTH EVERY DAY WITH BREAKFAST 90 tablet 1  . fluticasone (FLONASE) 50 MCG/ACT nasal spray Place 2 sprays into both nostrils daily. 48 g 2  . fluticasone furoate-vilanterol (BREO ELLIPTA) 200-25 MCG/INH AEPB Inhale 1 puff into the lungs daily. 60 each 5  . ipratropium (ATROVENT) 0.03 % nasal spray Place 2 sprays into both nostrils 2 (two) times daily. 90 mL 1  . metFORMIN (GLUCOPHAGE) 500 MG tablet TAKE ONE TABLET BY MOUTH TWICE DAILY TO CONTROL BLOOD SUGAR 180 tablet 2  . montelukast (SINGULAIR) 10 MG tablet TAKE 1 TABLET BY MOUTH EVERYDAY AT BEDTIME 90 tablet 5  . omeprazole (PRILOSEC) 40 MG capsule TAKE 1 CAPSULE BY MOUTH EVERY DAY 90 capsule 1  . ONE TOUCH ULTRA TEST test strip Check blood sugar once daily as directed  3  . quinapril (ACCUPRIL) 20 MG tablet Take 1 tablet (20 mg total) by mouth at bedtime. 90 tablet 1  . TRADJENTA 5 MG TABS tablet TAKE 1 TABLET BY MOUTH EVERY DAY 90 tablet 1  . baclofen (LIORESAL) 10 MG tablet TAKE 1 TABLET BY MOUTH THREE TIMES A DAY AS NEEDED FOR MUSCLE SPASM     No current facility-administered medications for this visit.    Allergies: No Known Allergies I reviewed her past medical history, social history, family history, and environmental history and no  significant changes have been reported from previous visits.  Review of Systems  Constitutional: Negative for activity change, appetite change, chills and fever.  HENT: Negative for congestion, postnasal drip, rhinorrhea, sinus pressure and sore throat.   Eyes: Negative for pain, discharge, redness and itching.  Respiratory: Negative for shortness of breath, wheezing and stridor.   Gastrointestinal: Negative for diarrhea, nausea and vomiting.  Musculoskeletal: Negative for arthralgias, joint swelling and myalgias.  Skin: Negative for rash.  Allergic/Immunologic: Negative for environmental allergies and food allergies.    Objective:  Physical exam not obtained as encounter was done via telephone.   Previous notes and tests were reviewed.  I discussed the assessment and treatment plan with the patient. The patient was provided an opportunity to ask questions and all were answered. The patient agreed with the plan and demonstrated an understanding of the instructions.   The patient was advised to call back or seek an in-person evaluation if the symptoms  worsen or if the condition fails to improve as anticipated.  I provided 23 minutes of non-face-to-face time during this encounter.  It was my pleasure to participate in [place] care today. Please feel free to contact me with any questions or concerns.   Sincerely,  Valentina Shaggy, MD

## 2018-12-03 ENCOUNTER — Telehealth: Payer: Self-pay | Admitting: Orthopedic Surgery

## 2018-12-03 NOTE — Telephone Encounter (Signed)
Received vm from patient needing records to take to Dr Lorre Nick. I have ready,she will sign release when she picks up

## 2018-12-08 ENCOUNTER — Other Ambulatory Visit: Payer: Self-pay | Admitting: *Deleted

## 2018-12-08 MED ORDER — QUINAPRIL HCL 20 MG PO TABS: 20.0000 mg | ORAL_TABLET | Freq: Every day | ORAL | 1 refills | Status: DC

## 2018-12-08 NOTE — Telephone Encounter (Signed)
Patient requested refill

## 2018-12-13 ENCOUNTER — Other Ambulatory Visit: Payer: Self-pay

## 2018-12-13 ENCOUNTER — Telehealth: Payer: Self-pay | Admitting: Neurology

## 2018-12-13 ENCOUNTER — Ambulatory Visit (INDEPENDENT_AMBULATORY_CARE_PROVIDER_SITE_OTHER): Payer: Medicare Other | Admitting: Neurology

## 2018-12-13 ENCOUNTER — Encounter: Payer: Self-pay | Admitting: Neurology

## 2018-12-13 VITALS — BP 161/87 | HR 77 | Temp 96.9°F | Ht 63.0 in | Wt 215.0 lb

## 2018-12-13 DIAGNOSIS — G4440 Drug-induced headache, not elsewhere classified, not intractable: Secondary | ICD-10-CM

## 2018-12-13 DIAGNOSIS — Z9114 Patient's other noncompliance with medication regimen: Secondary | ICD-10-CM

## 2018-12-13 DIAGNOSIS — R519 Headache, unspecified: Secondary | ICD-10-CM

## 2018-12-13 DIAGNOSIS — Z91148 Patient's other noncompliance with medication regimen for other reason: Secondary | ICD-10-CM

## 2018-12-13 DIAGNOSIS — G43719 Chronic migraine without aura, intractable, without status migrainosus: Secondary | ICD-10-CM

## 2018-12-13 DIAGNOSIS — G441 Vascular headache, not elsewhere classified: Secondary | ICD-10-CM

## 2018-12-13 DIAGNOSIS — R51 Headache with orthostatic component, not elsewhere classified: Secondary | ICD-10-CM

## 2018-12-13 MED ORDER — TOPIRAMATE 50 MG PO TABS: ORAL_TABLET | ORAL | 6 refills | Status: DC

## 2018-12-13 MED ORDER — TIZANIDINE HCL 4 MG PO TABS: 4.0000 mg | ORAL_TABLET | Freq: Four times a day (QID) | ORAL | 3 refills | Status: DC | PRN

## 2018-12-13 NOTE — Progress Notes (Signed)
[facility]    Provider:  Dr Jaynee Eagles Requesting Provider: [name], NP Primary Care Provider:  [name], NP  CC:  headache  HPI:  Tabitha Oneal is a 63 y.o. female here as requested by [name], NP for headaches.  She has a past medical history of migraines, hypertension, asthma, diabetes, hyperlipidemia, osteoarthritis, contracture of left knee, chronic low back pain, chronic pain syndrome, high risk medication use. She takes excedrin 2x a day. She also takes the "PM kond"  The headaches are more in the front behind both eyes. It is throbbing. It hurts. She has daily headaches. She only took the Topiramate for a few days and then stopped. She has nausea. She has to lay down, throbbing. Sleeping helps. Light sensitivity. The headaches get better with medicaine but it comes back when the medicatuon wears off. She denies vision changes, numbness, weakness, speech or gait new difficulties. It hurts to bend over, the headaches will wake her up. She does not know if she snores and can;t tell me a lot about her sleeping.  No other focal neurologic deficits, associated symptoms, inciting events or modifiable factors.  Migraine medications tried include: Topamax, baclofen, Excedrin, Tylenol, Zofran, Accupril (ACE inhibitors can be used as migraine prevention), Phenergan  Reviewed notes, labs and imaging from outside physicians, which showed:  I reviewed notes from Sherrie Mustache.  Patient has a history of migraines.  Patient reports she often wakes up in the morning with a headache.  She was tried on several migraine medications and it has not helped.  Baclofen has not helped either.  She takes Excedrin Migraine often.  She denies neck tension.  Reports the head pain is in the front near her eyes.  She has blurry/cloudy vision even with her glasses.  Her last eye exam was at least a year ago or maybe more.  She has uncontrolled blood pressure in the  office it was 168/97.  She does eat mostly canned foods at home.  She is on Topamax 25 mg twice daily.    Review of Systems: Patient complains of symptoms per HPI as well as the following symptoms: headaches. Pertinent negatives and positives per HPI. All others negative.   Social History   Socioeconomic History  . Marital status: Widowed    Spouse name: Not on file  . Number of children: 4  . Years of education: 39  . Highest education level: Some college, no degree  Occupational History  . Not on file  Social Needs  . Financial resource strain: Somewhat hard  . Food insecurity    Worry: Sometimes true    Inability: Sometimes true  . Transportation needs    Medical: No    Non-medical: No  Tobacco Use  . Smoking status: Never Smoker  . Smokeless tobacco: Never Used  Substance and Sexual Activity  . Alcohol use: No    Alcohol/week: 0.0 standard drinks  . Drug use: No  . Sexual activity: Yes    Birth control/protection: Surgical  Lifestyle  . Physical activity    Days per week: 7 days    Minutes per session: 30 min  . Stress: To some extent  Relationships  . Social Herbalist on phone: Twice a week    Gets together: Once a week    Attends religious service: More than 4 times per year    Active member of club or organization: No    Attends meetings of  clubs or organizations: Never    Relationship status: Widowed  . Intimate partner violence    Fear of current or ex partner: No    Emotionally abused: No    Physically abused: No    Forced sexual activity: No  Other Topics Concern  . Not on file  Social History Narrative   Diet: none   Do you drink/eat things with caffeine ? Coffee    Material status: widow     What year were you married? 08/01/1978   Do you live in a house, apartment, assisted living,condo, trailer,ect.)? Townhouse (temporary)    Is it one or more stories? Yes   How many persons live in your home? Two   Do you have any pets in your  home ? Yes, Shih-tzu   Current or past profession: none   Do you exercise? No  Type & how often: no   Do you have a living will ? No   Do you have a DNR form? No   If not, do you want to discuss one?  Not now   Do you have signed POA /HPOA forms? No    If so, please bring to your appointment.            Update 12/13/2018: pt states she doesn't drink much coffee, tea   She lives alone                      Family History  Problem Relation Age of Onset  . Bronchitis Father 77  . Alzheimer's disease Mother   . High blood pressure Sister   . High blood pressure Sister   . Headache Sister        pt thinks   . High blood pressure Sister   . High blood pressure Sister        pt thinks  . High blood pressure Sister   . High blood pressure Sister   . Headache Daughter   . Diabetes Daughter   . Diabetes Daughter   . Diabetes Other   . Colon cancer Neg Hx   . Esophageal cancer Neg Hx   . Rectal cancer Neg Hx   . Stomach cancer Neg Hx     Past Medical History:  Diagnosis Date  . Asthma    QVAR daily and Albuterol as needed  . Bladder infection    taking Keflex daily   . Chronic back pain    reason unknown  . Diabetes mellitus    takes Metformin and Tradjenta daily  . GERD (gastroesophageal reflux disease)    takes Omeprazole daily  . Hard of hearing   . Headache    daily.Takes Excedrine daily  . History of blood transfusion    no abnormal reaction noted  . Hyperlipidemia    takes Atorvastatin daily  . Hypertension    takes Quinapril daily  . Joint pain   . Joint swelling   . Nocturia   . Osteoarthritis of left knee 02/23/2012  . Seasonal allergies    takes Claritin and Singulair daily.Nasal spray as needed  . Shortness of breath dyspnea    with exertion    Patient Active Problem List   Diagnosis Date Noted  . Rebound headache 12/13/2018  . Overuse of medication 12/13/2018  . Intractable chronic migraine without aura and without status migrainosus  12/13/2018  . Microcytic anemia 02/24/2018  . High risk medication use 08/06/2017  . Seasonal and perennial allergic rhinitis 07/30/2017  .  Recurrent infections 07/30/2017  . Cough 06/08/2017  . Moderate persistent asthma without complication 65/78/4696  . Chronic constipation 02/04/2017  . Hyperlipidemia due to type 2 diabetes mellitus ([place]) 02/04/2017  . Hallux rigidus, left foot 01/01/2017  . Postmenopausal symptoms 02/22/2016  . Arthrofibrosis of knee joint 08/28/2015  . Left hip pain 10/08/2014  . Type 2 diabetes mellitus without complication, without long-term current use of insulin ([place]) 10/08/2014  . Hyperlipidemia LDL goal <100 10/08/2014  . Essential hypertension, benign 08/30/2014  . Diabetes mellitus, type 2 ([place]) 08/30/2014  . Diverticulosis of colon without hemorrhage 08/30/2014  . Chronic low back pain 08/30/2014  . Chronic pain syndrome 08/30/2014  . Asthma in adult without complication 29/52/8413  . 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 Osteoarthritis of left knee 02/23/2012    Past Surgical History:  Procedure Laterality Date  . ABDOMINAL HYSTERECTOMY    . INJECTION KNEE  04/09/2012   Procedure: KNEE INJECTION;  Surgeon: Johnny Bridge, MD;  Location: [facility];  Service: Orthopedics;  Laterality: Left;  . JOINT REPLACEMENT     acl   bil knees  . KNEE ARTHROTOMY Left 08/28/2015   Procedure: LEFT KNEE ARTHROFIBROSIS EXCISION; pylectomy;  Surgeon: Meredith Pel, MD;  Location: [facility];  Service: Orthopedics;  Laterality: Left;  . KNEE CLOSED REDUCTION  04/09/2012   Procedure: CLOSED MANIPULATION KNEE;  Surgeon: Johnny Bridge, MD;  Location: [facility];  Service: Orthopedics;  Laterality: Left;  Manipulation Knee with Anesthesia includes Application of Traction   . TOTAL KNEE ARTHROPLASTY  02/23/2012   Procedure: TOTAL KNEE ARTHROPLASTY;  Surgeon: Johnny Bridge, MD;  Location: [facility];  Service:  Orthopedics;  Laterality: Left;    Current Outpatient Medications  Medication Sig Dispense Refill  . Albuterol Sulfate (PROAIR RESPICLICK) 244 (90 Base) MCG/ACT AEPB Inhale 4 puffs into the lungs every 4 (four) hours as needed. 1 each 1  . aspirin-acetaminophen-caffeine (EXCEDRIN MIGRAINE) 010-272-53 MG per tablet Take 2 tablets by mouth daily.    Marland Kitchen atorvastatin (LIPITOR) 40 MG tablet Take 1 tablet (40 mg total) by mouth daily. 90 tablet 1  . azelastine (ASTELIN) 0.1 % nasal spray USE 1 SPRAY EACH NOSTRIL TWICE DAILY 30 mL 2  . baclofen (LIORESAL) 10 MG tablet TAKE 1 TABLET BY MOUTH THREE TIMES A DAY AS NEEDED FOR MUSCLE SPASM    . diclofenac sodium (VOLTAREN) 1 % GEL Apply 8 g topically 2 (two) times daily as needed. On knees    . ferrous sulfate 325 (65 FE) MG tablet TAKE 1 TABLET BY MOUTH EVERY DAY WITH BREAKFAST 90 tablet 1  . fluticasone (FLONASE) 50 MCG/ACT nasal spray Place 2 sprays into both nostrils daily. 48 g 2  . fluticasone furoate-vilanterol (BREO ELLIPTA) 200-25 MCG/INH AEPB Inhale 1 puff into the lungs daily. 60 each 5  . ipratropium (ATROVENT) 0.03 % nasal spray Place 2 sprays into both nostrils 2 (two) times daily. 90 mL 1  . metFORMIN (GLUCOPHAGE) 500 MG tablet TAKE ONE TABLET BY MOUTH TWICE DAILY TO CONTROL BLOOD SUGAR 180 tablet 2  . montelukast (SINGULAIR) 10 MG tablet TAKE 1 TABLET BY MOUTH EVERYDAY AT BEDTIME 90 tablet 5  . omeprazole (PRILOSEC) 40 MG capsule TAKE 1 CAPSULE BY MOUTH EVERY DAY 90 capsule 1  . ONE TOUCH ULTRA TEST test strip Check blood sugar once daily as directed  3  . quinapril (ACCUPRIL) 20 MG tablet Take 1 tablet (20 mg total)  by mouth at bedtime. 90 tablet 1  . TRADJENTA 5 MG TABS tablet TAKE 1 TABLET BY MOUTH EVERY DAY 90 tablet 1  . tiZANidine (ZANAFLEX) 4 MG tablet Take 1 tablet (4 mg total) by mouth every 6 (six) hours as needed for muscle spasms. For headaches. 90 tablet 3  . topiramate (TOPAMAX) 50 MG tablet Start with one pill(50mg ) at bedtime  and increase to two pills(100mg ) at bedtime in one week. 30 tablet 6   No current facility-administered medications for this visit.     Allergies as of 12/13/2018  . (No Known Allergies)    Vitals: BP (!) 161/87 (BP Location: Left Arm, Patient Position: Sitting)   Pulse 77   Temp (!) 96.9 F (36.1 C) Comment: taken by front staff  Ht 5\' 3"  (1.6 m)   Wt 215 lb (97.5 kg)   BMI 38.09 kg/m  Last Weight:  Wt Readings from Last 1 Encounters:  12/13/18 215 lb (97.5 kg)   Last Height:   Ht Readings from Last 1 Encounters:  12/13/18 5\' 3"  (1.6 m)     Physical exam: Exam: Gen: NAD, conversant, well nourised, obese, well groomed                     CV: RRR, no MRG. No Carotid Bruits. No peripheral edema, warm, nontender Eyes: Conjunctivae clear without exudates or hemorrhage  Neuro: Detailed Neurologic Exam  Speech:    Speech is normal; fluent and spontaneous with normal comprehension.  Cognition:    The patient is oriented to person, place, and time;     recent and remote memory intact;     language fluent;     normal attention, concentration,     fund of knowledge Cranial Nerves:    The pupils are equal, round, and reactive to light. Attempted fundoscopic exam could not visualize due to small pupils. Extraocular movements are intact. Trigeminal sensation is intact and the muscles of mastication are normal. The face is symmetric. The palate elevates in the midline. Hearing intact. Voice is normal. Shoulder shrug is normal. The tongue has normal motion without fasciculations.   Coordination:    No dysmetria noted  Gait:    Antalgic, brace on left knee  Motor Observation:    No asymmetry, no atrophy, and no involuntary movements noted. Tone:    Normal muscle tone.    Posture:    Posture is normal.    Strength:    Strength is V/V in the upper and lower limbs.      Sensation: intact to LT     Reflex Exam:  DTR's:    Left AJ trace, right AJ 2+, she declined  knees, Otherwise deep tendon reflexes in the upper and lower extremities are normal bilaterally.   Toes:    The toes are downgoing bilaterally.   Clonus:    Clonus is absent.    Assessment/Plan:  Chronic daily headaches due to migraines and medication overuse  I had a long discussion with patient that her daily use of excedrin can cause medication overuse/rebound headache which is likely causing her chronic daily headaches. They only thing to do is to stop the medication unfortunately. In the timeframe after stopping at her headaches may get much worse. I will give her some medication to try to bridge that. She should significantly  improve with her chronic daily headaches after 2-4 weeks of being off her daily over-the-counter medications. Do not use these medications more than 2 times  in a week.  Migraines: Discussed migraine management including acute management and preventative management.   MRI brain due to concerning symptoms of morning headaches, positional headaches, exertional headaches  to look for space occupying mass, chiari or intracranial hypertension (pseudotumor).  Poor sleep habits: goes to bed at 1-2am and doesn't sleep well. May consider a sleep apnea test in the future given morning headaches but this can be a sign of medication overuse and we will address that first. Discussed good sleep habits.   Migraine prevention: Discussed Topamax side effects especially teratogenicity do not get pregnant and use birth control. She was started on topamax but only took it a few days I will start at 50mg  at bedtime then increase to 100mg .   - Stop daily over the counter med use (Tylenol, excedrin, ibuprofen, alleve) Do not tak emore than 2-3x in one week  - tizanidine as needed for migraine  . Takes 4-6 weeks for medications to work. Discussed with patient, do not stop after a few days.  Meds ordered this encounter  Medications  . topiramate (TOPAMAX) 50 MG tablet    Sig: Start  with one pill(50mg ) at bedtime and increase to two pills(100mg ) at bedtime in one week.    Dispense:  30 tablet    Refill:  6  . tiZANidine (ZANAFLEX) 4 MG tablet    Sig: Take 1 tablet (4 mg total) by mouth every 6 (six) hours as needed for muscle spasms. For headaches.    Dispense:  90 tablet    Refill:  3   Orders Placed This Encounter  Procedures  . MR BRAIN W WO CONTRAST  . Basic Metabolic Panel  . CBC  . TSH     As far as diagnostic testing: If headaches persist need MRI brain  Discussed; To prevent or relieve headaches, try the following:  Cool Compress. Lie down and place a cool compress on your head.   Avoid headache triggers. If certain foods or odors seem to have triggered your migraines in the past, avoid them. A headache diary might help you identify triggers.   Include physical activity in your daily routine. Try a daily walk or other moderate aerobic exercise.   Manage stress. Find healthy ways to cope with the stressors, such as delegating tasks on your to-do list.   Practice relaxation techniques. Try deep breathing, yoga, massage and visualization.   Eat regularly. Eating regularly scheduled meals and maintaining a healthy diet might help prevent headaches. Also, drink plenty of fluids.   Follow a regular sleep schedule. Sleep deprivation might contribute to headaches  Consider biofeedback. With this mind-body technique, you learn to control certain bodily functions - such as muscle tension, heart rate and blood pressure - to prevent headaches or reduce headache pain.    Proceed to emergency room if you experience new or worsening symptoms or symptoms do not resolve, if you have new neurologic symptoms or if headache is severe, or for any concerning symptom.     Orders Placed This Encounter  Procedures  . MR BRAIN W WO CONTRAST  . Basic Metabolic Panel  . CBC  . TSH   Meds ordered this encounter  Medications  . topiramate (TOPAMAX) 50 MG tablet     Sig: Start with one pill(50mg ) at bedtime and increase to two pills(100mg ) at bedtime in one week.    Dispense:  30 tablet    Refill:  6  . tiZANidine (ZANAFLEX) 4 MG tablet    Sig: Take  1 tablet (4 mg total) by mouth every 6 (six) hours as needed for muscle spasms. For headaches.    Dispense:  90 tablet    Refill:  3    Cc: [name], NP,  [name], NP  Sarina Ill, MD  [facility] [address] [place], [place] [address]  Phone [phone] Fax [phone]

## 2018-12-13 NOTE — Telephone Encounter (Signed)
[employer] medicare order sent to GI. No auth they will reach out to the patient to schedule.  

## 2018-12-13 NOTE — Patient Instructions (Addendum)
Start Topiramate at bedtime every night Tizanidine as needed for headaches instead of the Excedrin MRI of the brain    Analgesic Rebound Headache An analgesic rebound headache, sometimes called a medication overuse headache, is a headache that comes after pain medicine (analgesic) taken to treat the original (primary) headache has worn off. Any type of primary headache can return as a rebound headache if a person regularly takes analgesics more than three times a week to treat it. The types of primary headaches that are commonly associated with rebound headaches include:  Migraines.  Headaches that arise from tense muscles in the head and neck area (tension headaches).  Headaches that develop and happen again (recur) on one side of the head and around the eye (cluster headaches). If rebound headaches continue, they become chronic daily headaches. What are the causes? This condition may be caused by frequent use of:  Over-the-counter medicines such as aspirin, ibuprofen, and acetaminophen.  Sinus relief medicines and other medicines that contain caffeine.  Narcotic pain medicines such as codeine and oxycodone. What are the signs or symptoms? The symptoms of a rebound headache are the same as the symptoms of the original headache. Some of the symptoms of specific types of headaches include: Migraine headache  Pulsing or throbbing pain on one or both sides of the head.  Severe pain that interferes with daily activities.  Pain that is worsened by physical activity.  Nausea, vomiting, or both.  Pain with exposure to bright light, loud noises, or strong smells.  General sensitivity to bright light, loud noises, or strong smells.  Visual changes.  Numbness of one or both arms. Tension headache  Pressure around the head.  Dull, aching head pain.  Pain felt over the front and sides of the head.  Tenderness in the muscles of the head, neck, and shoulders. Cluster  headache  Severe pain that begins in or around one eye or temple.  Redness and tearing in the eye on the same side as the pain.  Droopy or swollen eyelid.  One-sided head pain.  Nausea.  Runny nose.  Sweaty, pale facial skin.  Restlessness. How is this diagnosed? This condition is diagnosed by:  Reviewing your medical history. This includes the nature of your primary headaches.  Reviewing the types of pain medicines that you have been using to treat your headaches and how often you take them. How is this treated? This condition may be treated or managed by:  Discontinuing frequent use of the analgesic medicine. Doing this may worsen your headaches at first, but the pain should eventually become more manageable, less frequent, and less severe.  Seeing a headache specialist. He or she may be able to help you manage your headaches and help make sure there is not another cause of the headaches.  Using methods of stress relief, such as acupuncture, counseling, biofeedback, and massage. Talk with your health care provider about which methods might be good for you. Follow these instructions at home:  Take over-the-counter and prescription medicines only as told by your health care provider.  Stop the repeated use of pain medicine as told by your health care provider. Stopping can be difficult. Carefully follow instructions from your health care provider.  Avoid triggers that are known to cause your primary headaches.  Keep all follow-up visits as told by your health care provider. This is important. Contact a health care provider if:  You continue to experience headaches after following treatments that your health care provider recommended. Get  help right away if:  You develop new headache pain.  You develop headache pain that is different than what you have experienced in the past.  You develop numbness or tingling in your arms or legs.  You develop changes in your speech  or vision. This information is not intended to replace advice given to you by your health care provider. Make sure you discuss any questions you have with your health care provider. Document Released: 08/02/2003 Document Revised: 04/24/2017 Document Reviewed: 10/15/2015 Elsevier Patient Education  2020 [place].  Migraine Headache A migraine headache is a very strong throbbing pain on one side or both sides of your head. This type of headache can also cause other symptoms. It can last from 4 hours to 3 days. Talk with your doctor about what things may bring on (trigger) this condition. What are the causes? The exact cause of this condition is not known. This condition may be triggered or caused by:  Drinking alcohol.  Smoking.  Taking medicines, such as: ? Medicine used to treat chest pain (nitroglycerin). ? Birth control pills. ? Estrogen. ? Some blood pressure medicines.  Eating or drinking certain products.  Doing physical activity. Other things that may trigger a migraine headache include:  Having a menstrual period.  Pregnancy.  Hunger.  Stress.  Not getting enough sleep or getting too much sleep.  Weather changes.  Tiredness (fatigue). What increases the risk?  Being 43-55 years old.  Being female.  Having a family history of migraine headaches.  Being Caucasian.  Having depression or anxiety.  Being very overweight. What are the signs or symptoms?  A throbbing pain. This pain may: ? Happen in any area of the head, such as on one side or both sides. ? Make it hard to do daily activities. ? Get worse with physical activity. ? Get worse around bright lights or loud noises.  Other symptoms may include: ? Feeling sick to your stomach (nauseous). ? Vomiting. ? Dizziness. ? Being sensitive to bright lights, loud noises, or smells.  Before you get a migraine headache, you may get warning signs (an aura). An aura may include: ? Seeing flashing  lights or having blind spots. ? Seeing bright spots, halos, or zigzag lines. ? Having tunnel vision or blurred vision. ? Having numbness or a tingling feeling. ? Having trouble talking. ? Having weak muscles.  Some people have symptoms after a migraine headache (postdromal phase), such as: ? Tiredness. ? Trouble thinking (concentrating). How is this treated?  Taking medicines that: ? Relieve pain. ? Relieve the feeling of being sick to your stomach. ? Prevent migraine headaches.  Treatment may also include: ? Having acupuncture. ? Avoiding foods that bring on migraine headaches. ? Learning ways to control your body functions (biofeedback). ? Therapy to help you know and deal with negative thoughts (cognitive behavioral therapy). Follow these instructions at home: Medicines  Take over-the-counter and prescription medicines only as told by your doctor.  Ask your doctor if the medicine prescribed to you: ? Requires you to avoid driving or using heavy machinery. ? Can cause trouble pooping (constipation). You may need to take these steps to prevent or treat trouble pooping:  Drink enough fluid to keep your pee (urine) pale yellow.  Take over-the-counter or prescription medicines.  Eat foods that are high in fiber. These include beans, whole grains, and fresh fruits and vegetables.  Limit foods that are high in fat and sugar. These include fried or sweet foods. Lifestyle  Do not drink alcohol.  Do not use any products that contain nicotine or tobacco, such as cigarettes, e-cigarettes, and chewing tobacco. If you need help quitting, ask your doctor.  Get at least 8 hours of sleep every night.  Limit and deal with stress. General instructions      Keep a journal to find out what may bring on your migraine headaches. For example, write down: ? What you eat and drink. ? How much sleep you get. ? Any change in what you eat or drink. ? Any change in your medicines.  If  you have a migraine headache: ? Avoid things that make your symptoms worse, such as bright lights. ? It may help to lie down in a dark, quiet room. ? Do not drive or use heavy machinery. ? Ask your doctor what activities are safe for you.  Keep all follow-up visits as told by your doctor. This is important. Contact a doctor if:  You get a migraine headache that is different or worse than others you have had.  You have more than 15 headache days in one month. Get help right away if:  Your migraine headache gets very bad.  Your migraine headache lasts longer than 72 hours.  You have a fever.  You have a stiff neck.  You have trouble seeing.  Your muscles feel weak or like you cannot control them.  You start to lose your balance a lot.  You start to have trouble walking.  You pass out (faint).  You have a seizure. Summary  A migraine headache is a very strong throbbing pain on one side or both sides of your head. These headaches can also cause other symptoms.  This condition may be treated with medicines and changes to your lifestyle.  Keep a journal to find out what may bring on your migraine headaches.  Contact a doctor if you get a migraine headache that is different or worse than others you have had.  Contact your doctor if you have more than 15 headache days in a month. This information is not intended to replace advice given to you by your health care provider. Make sure you discuss any questions you have with your health care provider. Document Released: 02/19/2008 Document Revised: 09/03/2018 Document Reviewed: 06/24/2018 Elsevier Patient Education  [place].   Topiramate tablets What is this medicine? TOPIRAMATE (toe PYRE a mate) is used to treat seizures in adults or children with epilepsy. It is also used for the prevention of migraine headaches. This medicine may be used for other purposes; ask your health care provider or pharmacist if you have  questions. COMMON BRAND NAME(S): Topamax, Topiragen What should I tell my health care provider before I take this medicine? They need to know if you have any of these conditions:  bleeding disorders  cirrhosis of the liver or liver disease  diarrhea  glaucoma  kidney stones or kidney disease  low blood counts, like low white cell, platelet, or red cell counts  lung disease like asthma, obstructive pulmonary disease, emphysema  metabolic acidosis  on a ketogenic diet  schedule for surgery or a procedure  suicidal thoughts, plans, or attempt; a previous suicide attempt by you or a family member  an unusual or allergic reaction to topiramate, other medicines, foods, dyes, or preservatives  pregnant or trying to get pregnant  breast-feeding How should I use this medicine? Take this medicine by mouth with a glass of water. Follow the directions on the  prescription label. Do not crush or chew. You may take this medicine with meals. Take your medicine at regular intervals. Do not take it more often than directed. Talk to your pediatrician regarding the use of this medicine in children. Special care may be needed. While this drug may be prescribed for children as young as 65 years of age for selected conditions, precautions do apply. Overdosage: If you think you have taken too much of this medicine contact a poison control center or emergency room at once. NOTE: This medicine is only for you. Do not share this medicine with others. What if I miss a dose? If you miss a dose, take it as soon as you can. If your next dose is to be taken in less than 6 hours, then do not take the missed dose. Take the next dose at your regular time. Do not take double or extra doses. What may interact with this medicine? Do not take this medicine with any of the following medications:  probenecid This medicine may also interact with the following  medications:  acetazolamide  alcohol  amitriptyline  aspirin and aspirin-like medicines  birth control pills  certain medicines for depression  certain medicines for seizures  certain medicines that treat or prevent blood clots like warfarin, enoxaparin, dalteparin, apixaban, dabigatran, and rivaroxaban  digoxin  hydrochlorothiazide  lithium  medicines for pain, sleep, or muscle relaxation  metformin  methazolamide  NSAIDS, medicines for pain and inflammation, like ibuprofen or naproxen  pioglitazone  risperidone This list may not describe all possible interactions. Give your health care provider a list of all the medicines, herbs, non-prescription drugs, or dietary supplements you use. Also tell them if you smoke, drink alcohol, or use illegal drugs. Some items may interact with your medicine. What should I watch for while using this medicine? Visit your doctor or health care professional for regular checks on your progress. Do not stop taking this medicine suddenly. This increases the risk of seizures if you are using this medicine to control epilepsy. Wear a medical identification bracelet or chain to say you have epilepsy or seizures, and carry a card that lists all your medicines. This medicine can decrease sweating and increase your body temperature. Watch for signs of deceased sweating or fever, especially in children. Avoid extreme heat, hot baths, and saunas. Be careful about exercising, especially in hot weather. Contact your health care provider right away if you notice a fever or decrease in sweating. You should drink plenty of fluids while taking this medicine. If you have had kidney stones in the past, this will help to reduce your chances of forming kidney stones. If you have stomach pain, with nausea or vomiting and yellowing of your eyes or skin, call your doctor immediately. You may get drowsy, dizzy, or have blurred vision. Do not drive, use machinery, or  do anything that needs mental alertness until you know how this medicine affects you. To reduce dizziness, do not sit or stand up quickly, especially if you are an older patient. Alcohol can increase drowsiness and dizziness. Avoid alcoholic drinks. If you notice blurred vision, eye pain, or other eye problems, seek medical attention at once for an eye exam. The use of this medicine may increase the chance of suicidal thoughts or actions. Pay special attention to how you are responding while on this medicine. Any worsening of mood, or thoughts of suicide or dying should be reported to your health care professional right away. This medicine may increase  the chance of developing metabolic acidosis. If left untreated, this can cause kidney stones, bone disease, or slowed growth in children. Symptoms include breathing fast, fatigue, loss of appetite, irregular heartbeat, or loss of consciousness. Call your doctor immediately if you experience any of these side effects. Also, tell your doctor about any surgery you plan on having while taking this medicine since this may increase your risk for metabolic acidosis. Birth control pills may not work properly while you are taking this medicine. Talk to your doctor about using an extra method of birth control. Women who become pregnant while using this medicine may enroll in the [place] Pregnancy Registry by calling [phone]. This registry collects information about the safety of antiepileptic drug use during pregnancy. What side effects may I notice from receiving this medicine? Side effects that you should report to your doctor or health care professional as soon as possible:  allergic reactions like skin rash, itching or hives, swelling of the face, lips, or tongue  decreased sweating and/or rise in body temperature  depression  difficulty breathing, fast or irregular breathing patterns  difficulty speaking  difficulty  walking or controlling muscle movements  hearing impairment  redness, blistering, peeling or loosening of the skin, including inside the mouth  tingling, pain or numbness in the hands or feet  unusual bleeding or bruising  unusually weak or tired  worsening of mood, thoughts or actions of suicide or dying Side effects that usually do not require medical attention (report to your doctor or health care professional if they continue or are bothersome):  altered taste  back pain, joint or muscle aches and pains  diarrhea, or constipation  headache  loss of appetite  nausea  stomach upset, indigestion  tremors This list may not describe all possible side effects. Call your doctor for medical advice about side effects. You may report side effects to FDA at 1-800-FDA-1088. Where should I keep my medicine? Keep out of the reach of children. Store at room temperature between 15 and 30 degrees C (59 and 86 degrees F) in a tightly closed container. Protect from moisture. Throw away any unused medicine after the expiration date. NOTE: This sheet is a summary. It may not cover all possible information. If you have questions about this medicine, talk to your doctor, pharmacist, or health care provider.  2020 Elsevier/Gold Standard (2013-05-16 23:17:57) Tizanidine tablets or capsules What is this medicine? TIZANIDINE (tye ZAN i deen) helps to relieve muscle spasms. It may be used to help in the treatment of multiple sclerosis and spinal cord injury. This medicine may be used for other purposes; ask your health care provider or pharmacist if you have questions. COMMON BRAND NAME(S): Zanaflex What should I tell my health care provider before I take this medicine? They need to know if you have any of these conditions:  kidney disease  liver disease  low blood pressure  mental disorder  an unusual or allergic reaction to tizanidine, other medicines, lactose (tablets only), foods,  dyes, or preservatives  pregnant or trying to get pregnant  breast-feeding How should I use this medicine? Take this medicine by mouth with a full glass of water. Take this medicine on an empty stomach, at least 30 minutes before or 2 hours after food. Do not take with food unless you talk with your doctor. Follow the directions on the prescription label. Take your medicine at regular intervals. Do not take your medicine more often than directed. Do not stop  taking except on your doctor's advice. Suddenly stopping the medicine can be very dangerous. Talk to your pediatrician regarding the use of this medicine in children. Patients over 30 years old may have a stronger reaction and need a smaller dose. Overdosage: If you think you have taken too much of this medicine contact a poison control center or emergency room at once. NOTE: This medicine is only for you. Do not share this medicine with others. What if I miss a dose? If you miss a dose, take it as soon as you can. If it is almost time for your next dose, take only that dose. Do not take double or extra doses. What may interact with this medicine? Do not take this medicine with any of the following medications:  ciprofloxacin  fluvoxamine  narcotic medicines for cough  thiabendazole This medicine may also interact with the following medications:  acyclovir  alcohol  antihistamines for allergy, cough, and cold  baclofen  certain medicines for anxiety or sleep  certain medicines for blood pressure, heart disease, irregular heartbeat  certain medicines for depression like amitriptyline, fluoxetine, sertraline  certain medicines for seizures like phenobarbital, primidone  certain medicines for stomach problems like cimetidine, famotidine  female hormones, like estrogens or progestins and birth control pills, patches, rings, or injections  general anesthetics like halothane, isoflurane, methoxyflurane, propofol  local  anesthetics like lidocaine, pramoxine, tetracaine  medicines that relax muscles for surgery  narcotic medicines for pain  phenothiazines like chlorpromazine, mesoridazine, prochlorperazine  ticlopidine  zileuton This list may not describe all possible interactions. Give your health care provider a list of all the medicines, herbs, non-prescription drugs, or dietary supplements you use. Also tell them if you smoke, drink alcohol, or use illegal drugs. Some items may interact with your medicine. What should I watch for while using this medicine? Tell your doctor or health care professional if your symptoms do not start to get better or if they get worse. You may get drowsy or dizzy. Do not drive, use machinery, or do anything that needs mental alertness until you know how this medicine affects you. Do not stand or sit up quickly, especially if you are an older patient. This reduces the risk of dizzy or fainting spells. Alcohol may interfere with the effect of this medicine. Avoid alcoholic drinks. If you are taking another medicine that also causes drowsiness, you may have more side effects. Give your health care provider a list of all medicines you use. Your doctor will tell you how much medicine to take. Do not take more medicine than directed. Call emergency for help if you have problems breathing or unusual sleepiness. Your mouth may get dry. Chewing sugarless gum or sucking hard candy, and drinking plenty of water may help. Contact your doctor if the problem does not go away or is severe. What side effects may I notice from receiving this medicine? Side effects that you should report to your doctor or health care professional as soon as possible:  allergic reactions like skin rash, itching or hives, swelling of the face, lips, or tongue  breathing problems  hallucinations  signs and symptoms of liver injury like dark yellow or brown urine; general ill feeling or flu-like symptoms;  light-colored stools; loss of appetite; nausea; right upper quadrant belly pain; unusually weak or tired; yellowing of the eyes or skin  signs and symptoms of low blood pressure like dizziness; feeling faint or lightheaded, falls; unusually weak or tired  unusually slow heartbeat  unusually weak or tired Side effects that usually do not require medical attention (report to your doctor or health care professional if they continue or are bothersome):  blurred vision  constipation  dizziness  dry mouth  tiredness This list may not describe all possible side effects. Call your doctor for medical advice about side effects. You may report side effects to FDA at 1-800-FDA-1088. Where should I keep my medicine? Keep out of the reach of children. Store at room temperature between 15 and 30 degrees C (59 and 86 degrees F). Throw away any unused medicine after the expiration date. NOTE: This sheet is a summary. It may not cover all possible information. If you have questions about this medicine, talk to your doctor, pharmacist, or health care provider.  2020 Elsevier/Gold Standard (2017-02-24 13:33:29)

## 2018-12-14 ENCOUNTER — Other Ambulatory Visit: Payer: Self-pay | Admitting: Nurse Practitioner

## 2018-12-14 LAB — CBC
Hematocrit: 32.7 % — ABNORMAL LOW (ref 34.0–46.6)
Hemoglobin: 11.1 g/dL (ref 11.1–15.9)
MCH: 25.9 pg — ABNORMAL LOW (ref 26.6–33.0)
MCHC: 33.9 g/dL (ref 31.5–35.7)
MCV: 76 fL — ABNORMAL LOW (ref 79–97)
Platelets: 263 10*3/uL (ref 150–450)
RBC: 4.28 x10E6/uL (ref 3.77–5.28)
RDW: 13.1 % (ref 11.7–15.4)
WBC: 7.6 10*3/uL (ref 3.4–10.8)

## 2018-12-14 LAB — BASIC METABOLIC PANEL
BUN/Creatinine Ratio: 13 (ref 12–28)
BUN: 10 mg/dL (ref 8–27)
CO2: 19 mmol/L — ABNORMAL LOW (ref 20–29)
Calcium: 10 mg/dL (ref 8.7–10.3)
Chloride: 105 mmol/L (ref 96–106)
Creatinine, Ser: 0.78 mg/dL (ref 0.57–1.00)
GFR calc Af Amer: 94 mL/min/{1.73_m2} (ref >59)
GFR calc non Af Amer: 81 mL/min/{1.73_m2} (ref >59)
Glucose: 113 mg/dL — ABNORMAL HIGH (ref 65–99)
Potassium: 3.9 mmol/L (ref 3.5–5.2)
Sodium: 145 mmol/L — ABNORMAL HIGH (ref 134–144)

## 2018-12-14 LAB — TSH: TSH: 0.517 u[IU]/mL (ref 0.450–4.500)

## 2018-12-20 ENCOUNTER — Other Ambulatory Visit: Payer: Self-pay

## 2018-12-20 DIAGNOSIS — Z20822 Contact with and (suspected) exposure to covid-19: Secondary | ICD-10-CM

## 2018-12-23 LAB — NOVEL CORONAVIRUS, NAA: SARS-CoV-2, NAA: NOT DETECTED

## 2018-12-28 ENCOUNTER — Other Ambulatory Visit: Payer: Self-pay

## 2018-12-28 ENCOUNTER — Ambulatory Visit
Admission: RE | Admit: 2018-12-28 | Discharge: 2018-12-28 | Disposition: A | Discharge status: Home / Self Care | Payer: Medicare Other | Source: Ambulatory Visit | Attending: Family | Admitting: Family

## 2018-12-28 ENCOUNTER — Ambulatory Visit: Payer: Medicare Other

## 2018-12-28 DIAGNOSIS — Z1231 Encounter for screening mammogram for malignant neoplasm of breast: Secondary | ICD-10-CM

## 2018-12-29 ENCOUNTER — Ambulatory Visit (INDEPENDENT_AMBULATORY_CARE_PROVIDER_SITE_OTHER): Payer: Medicare Other | Admitting: Allergy & Immunology

## 2018-12-29 ENCOUNTER — Telehealth: Payer: Self-pay | Admitting: Allergy & Immunology

## 2018-12-29 ENCOUNTER — Encounter: Payer: Self-pay | Admitting: Allergy & Immunology

## 2018-12-29 ENCOUNTER — Telehealth: Payer: Self-pay | Admitting: Neurology

## 2018-12-29 ENCOUNTER — Other Ambulatory Visit: Payer: Self-pay

## 2018-12-29 DIAGNOSIS — J302 Other seasonal allergic rhinitis: Secondary | ICD-10-CM | POA: Diagnosis not present

## 2018-12-29 DIAGNOSIS — J3089 Other allergic rhinitis: Secondary | ICD-10-CM | POA: Diagnosis not present

## 2018-12-29 DIAGNOSIS — J4541 Moderate persistent asthma with (acute) exacerbation: Secondary | ICD-10-CM | POA: Diagnosis not present

## 2018-12-29 DIAGNOSIS — J0100 Acute maxillary sinusitis, unspecified: Secondary | ICD-10-CM | POA: Diagnosis not present

## 2018-12-29 MED ORDER — PREDNISONE 10 MG PO TABS: ORAL_TABLET | ORAL | 0 refills | Status: DC

## 2018-12-29 MED ORDER — BENZONATATE 100 MG PO CAPS: 100.0000 mg | ORAL_CAPSULE | Freq: Three times a day (TID) | ORAL | 1 refills | Status: DC | PRN

## 2018-12-29 MED ORDER — DOXYCYCLINE MONOHYDRATE 100 MG PO TABS: 100.0000 mg | ORAL_TABLET | Freq: Two times a day (BID) | ORAL | 0 refills | Status: AC

## 2018-12-29 NOTE — Telephone Encounter (Signed)
Patient called back she was added for telemed right now with Dr Ernst Bowler

## 2018-12-29 NOTE — Progress Notes (Signed)
RE: Tabitha Oneal MRN: [id number] DOB: [birth year] Date of Telemedicine Visit: 12/29/2018  Referring provider: [name], NP Primary care provider: [name], NP  Chief Complaint: Asthma (coughing and wheezing x1-2 weeks. using all meds as prescribed. some body aching. lots of nasal congestion. no fever. meds are not helping. )   Telemedicine Follow Up Visit via Telephone: I connected with Tabitha Oneal for a follow up on 12/29/18 by telephone and verified that I am speaking with the correct person using two identifiers.   I discussed the limitations, risks, security and privacy concerns of performing an evaluation and management service by telephone and the availability of in person appointments. I also discussed with the patient that there may be a patient responsible charge related to this service. The patient expressed understanding and agreed to proceed.  Patient is at home.  Provider is at the office.  Visit start time: 9:20 AM Visit end time: 9:43 AM Insurance consent/check in by: Anderson Malta Medical consent and medical assistant/nurse: Kayla  History of Present Illness:  She is a 63 y.o. female, who is being followed for persistent asthma as well as allergic rhinitis and GERD. Her previous allergy office visit was in July 2020 with myself.  At that visit, we did not make any medication changes.  We continued her on Breo 200/25 mcg 1 puff once daily with albuterol as needed.  For her rhinitis, we continue with nasal ipratropium as needed, Flonase, and Singulair.  Her reflux was controlled without any reflux meds.  Since the last visit, she has mostly done well. But she presents today for a sick visit. She reports two weeks of sinus congestion and cough. She was tested for COVID at the end of July 2020. She did this because of her current set of symptoms. This was thankfully negative.  She has been using her rescue inhaler with transient improvement.  She  does endorse some sinus pressure as well.  She has not been around anyone else who is been sick or anyone at all for that matter.  She has been maintaining social distance during the coronavirus pandemic.  She is eating and drinking well.  She denies any kind of fever.  She endorses good compliance with her Breo.  This is worked very well for her.  She does not remember the last time she received prednisone for her asthma.  Review of the chart shows that it has been over a year at least.  Otherwise, there have been no changes to her past medical history, surgical history, family history, or social history.  Assessment and Plan:  Tabitha Oneal is a 63 y.o. female with:  Moderate persistent asthma with acute exacerbation   Acute sinusitis  Seasonal and perennial allergic rhinitis (trees, mold, dust mite)  Gastroesophageal reflux disease   [name] presents for sick visit.  We are going to treat her with a course of prednisone and a course of doxycycline.  I did ask her to add on Mucinex and increase her hydration.  She is going to call us in 2 days with an update.  I am hopeful that this will help to improve her cough and sinus congestion.  We are adding on Tessalon Perles as well to help with the cough.  At this point, it does not seem that she needs any kind of further care, but we could consider an x-ray if there is no improvement by Friday.   Diagnostics: None.  Medication List:  Current Outpatient  Medications  Medication Sig Dispense Refill   Albuterol Sulfate (PROAIR RESPICLICK) 951 (90 Base) MCG/ACT AEPB Inhale 4 puffs into the lungs every 4 (four) hours as needed. 1 each 1   aspirin-acetaminophen-caffeine (EXCEDRIN MIGRAINE) 250-250-65 MG per tablet Take 2 tablets by mouth daily.     atorvastatin (LIPITOR) 40 MG tablet TAKE 1 TABLET BY MOUTH EVERY DAY 90 tablet 0   azelastine (ASTELIN) 0.1 % nasal spray USE 1 SPRAY EACH NOSTRIL TWICE DAILY 30 mL 2   baclofen (LIORESAL) 10  MG tablet TAKE 1 TABLET BY MOUTH THREE TIMES A DAY AS NEEDED FOR MUSCLE SPASM     diclofenac sodium (VOLTAREN) 1 % GEL Apply 8 g topically 2 (two) times daily as needed. On knees     ferrous sulfate 325 (65 FE) MG tablet TAKE 1 TABLET BY MOUTH EVERY DAY WITH BREAKFAST 90 tablet 1   fluticasone (FLONASE) 50 MCG/ACT nasal spray Place 2 sprays into both nostrils daily. 48 g 2   fluticasone furoate-vilanterol (BREO ELLIPTA) 200-25 MCG/INH AEPB Inhale 1 puff into the lungs daily. 60 each 5   ipratropium (ATROVENT) 0.03 % nasal spray Place 2 sprays into both nostrils 2 (two) times daily. 90 mL 1   metFORMIN (GLUCOPHAGE) 500 MG tablet TAKE ONE TABLET BY MOUTH TWICE DAILY TO CONTROL BLOOD SUGAR 180 tablet 2   montelukast (SINGULAIR) 10 MG tablet TAKE 1 TABLET BY MOUTH EVERYDAY AT BEDTIME 90 tablet 5   omeprazole (PRILOSEC) 40 MG capsule TAKE 1 CAPSULE BY MOUTH EVERY DAY 90 capsule 1   ONE TOUCH ULTRA TEST test strip Check blood sugar once daily as directed  3   quinapril (ACCUPRIL) 20 MG tablet Take 1 tablet (20 mg total) by mouth at bedtime. 90 tablet 1   tiZANidine (ZANAFLEX) 4 MG tablet Take 1 tablet (4 mg total) by mouth every 6 (six) hours as needed for muscle spasms. For headaches. 90 tablet 3   topiramate (TOPAMAX) 50 MG tablet Start with one pill(50mg ) at bedtime and increase to two pills(100mg ) at bedtime in one week. 30 tablet 6   TRADJENTA 5 MG TABS tablet TAKE 1 TABLET BY MOUTH EVERY DAY 90 tablet 1   benzonatate (TESSALON PERLES) 100 MG capsule Take 1 capsule (100 mg total) by mouth 3 (three) times daily as needed for cough. 20 capsule 1   doxycycline (ADOXA) 100 MG tablet Take 1 tablet (100 mg total) by mouth 2 (two) times daily for 7 days. 14 tablet 0   predniSONE (DELTASONE) 10 MG tablet Take two tablets (20mg ) twice daily for three days, then one tablet (10mg ) twice daily for three days, then STOP. 18 tablet 0   No current facility-administered medications for this visit.      Allergies: No Known Allergies I reviewed her past medical history, social history, family history, and environmental history and no significant changes have been reported from previous visits.  Review of Systems  Constitutional: Negative for activity change, appetite change, fatigue and fever.  HENT: Positive for rhinorrhea, sinus pressure and sinus pain. Negative for congestion, postnasal drip and sore throat.   Eyes: Negative for pain, discharge, redness and itching.  Respiratory: Positive for shortness of breath. Negative for wheezing and stridor.        Positive for dry cough.  Gastrointestinal: Negative for diarrhea, nausea and vomiting.  Endocrine: Negative for cold intolerance and heat intolerance.  Musculoskeletal: Negative for arthralgias, joint swelling and myalgias.  Skin: Negative for rash.  Allergic/Immunologic: Negative for environmental allergies  and food allergies.    Objective:  Physical exam not obtained as encounter was done via telephone.   Previous notes and tests were reviewed.  I discussed the assessment and treatment plan with the patient. The patient was provided an opportunity to ask questions and all were answered. The patient agreed with the plan and demonstrated an understanding of the instructions.   The patient was advised to call back or seek an in-person evaluation if the symptoms worsen or if the condition fails to improve as anticipated.  I provided 23 minutes of non-face-to-face time during this encounter.  It was my pleasure to participate in [place] care today. Please feel free to contact me with any questions or concerns.   Sincerely,  Valentina Shaggy, MD

## 2018-12-29 NOTE — Telephone Encounter (Signed)
Pt has called to inform that she believes it is either the topiramate (TOPAMAX) 50 MG tablet Or the tiZANidine (ZANAFLEX) 4 MG tablet or both or all of her medications together that is causing her to feel woozy and also causing her to have nose bleeds.  Please call

## 2018-12-29 NOTE — Telephone Encounter (Signed)
Called patient we can do a telemed visit with Dr Ernst Bowler today in [place]. LEft voicemail to return call

## 2018-12-29 NOTE — Telephone Encounter (Signed)
Pt called and said that she is coughing and got congestion and wanted to know if she needed to come in today to be seen. [phone]

## 2018-12-30 NOTE — Telephone Encounter (Signed)
I spoke with the pt. Discussed that I do not see there a known s/e of nosebleeds with Topiramate and Tizanidine. Patient was encouraged to speak with her primary care about the nosebleeds. She stated she is currently on cold medicine. I advised her to speak with primary care about the medicine she is taking in combination with the Tizanidine and Zanaflex. Pt stated she is taking the Topiramate 100 mg at bedtime now and is tired during the day. She takes the Tizanidine only when needed. She is careful does not operate heavy machinery while on this medication. I offered to check with MD if needed. Pt stated she is ok right now and she will call back if needed. She understood my advice to call primary care. She verbalized appreciation for the call.

## 2018-12-31 ENCOUNTER — Telehealth: Payer: Self-pay

## 2018-12-31 MED ORDER — CEFDINIR 300 MG PO CAPS: 300.0000 mg | ORAL_CAPSULE | Freq: Two times a day (BID) | ORAL | 0 refills | Status: AC

## 2018-12-31 NOTE — Telephone Encounter (Signed)
Patient called to let Dr Ernst Bowler know she is still coughing and its very irritating when she lays down at night time. Patient is coughing up cold and it hurts.  Please Advise.

## 2018-12-31 NOTE — Telephone Encounter (Signed)
Left message for patient to call office.  

## 2018-12-31 NOTE — Telephone Encounter (Signed)
Noted. Please order a CXR. Also we are going to add on cefdinir 300mg  BID. This can be used in conjunction with the doxycycline. She can add on an over the counter cough medicine such as Delsum.  Salvatore Marvel, MD [facility]

## 2019-01-14 ENCOUNTER — Other Ambulatory Visit: Payer: Self-pay

## 2019-01-14 ENCOUNTER — Ambulatory Visit
Admission: RE | Admit: 2019-01-14 | Discharge: 2019-01-14 | Disposition: A | Discharge status: Home / Self Care | Payer: Medicare Other | Source: Ambulatory Visit | Attending: Neurology | Admitting: Neurology

## 2019-01-14 DIAGNOSIS — R51 Headache with orthostatic component, not elsewhere classified: Secondary | ICD-10-CM

## 2019-01-14 DIAGNOSIS — R519 Headache, unspecified: Secondary | ICD-10-CM

## 2019-01-14 DIAGNOSIS — G43719 Chronic migraine without aura, intractable, without status migrainosus: Secondary | ICD-10-CM

## 2019-01-14 DIAGNOSIS — G441 Vascular headache, not elsewhere classified: Secondary | ICD-10-CM

## 2019-01-14 MED ORDER — GADOBENATE DIMEGLUMINE 529 MG/ML IV SOLN
20.0000 mL | Freq: Once | INTRAVENOUS | Status: AC | PRN
  Administered 2019-01-14: 20 mL via INTRAVENOUS

## 2019-01-24 ENCOUNTER — Ambulatory Visit (INDEPENDENT_AMBULATORY_CARE_PROVIDER_SITE_OTHER): Payer: Medicare Other | Admitting: Nurse Practitioner

## 2019-01-24 ENCOUNTER — Other Ambulatory Visit: Payer: Self-pay

## 2019-01-24 ENCOUNTER — Encounter: Payer: Self-pay | Admitting: Nurse Practitioner

## 2019-01-24 VITALS — BP 142/70 | HR 97 | Temp 97.1°F | Ht 63.0 in | Wt 200.0 lb

## 2019-01-24 DIAGNOSIS — D509 Iron deficiency anemia, unspecified: Secondary | ICD-10-CM

## 2019-01-24 DIAGNOSIS — J4540 Moderate persistent asthma, uncomplicated: Secondary | ICD-10-CM

## 2019-01-24 DIAGNOSIS — I10 Essential (primary) hypertension: Secondary | ICD-10-CM

## 2019-01-24 DIAGNOSIS — E119 Type 2 diabetes mellitus without complications: Secondary | ICD-10-CM

## 2019-01-24 DIAGNOSIS — Z23 Encounter for immunization: Secondary | ICD-10-CM | POA: Diagnosis not present

## 2019-01-24 DIAGNOSIS — G43719 Chronic migraine without aura, intractable, without status migrainosus: Secondary | ICD-10-CM | POA: Diagnosis not present

## 2019-01-24 DIAGNOSIS — E1169 Type 2 diabetes mellitus with other specified complication: Secondary | ICD-10-CM

## 2019-01-24 DIAGNOSIS — F4321 Adjustment disorder with depressed mood: Secondary | ICD-10-CM

## 2019-01-24 DIAGNOSIS — E785 Hyperlipidemia, unspecified: Secondary | ICD-10-CM

## 2019-01-24 NOTE — Patient Instructions (Addendum)
Follow up in September for awv and CPE *needs PAP*  You can contact hospice for Grief counseling [facility]  [phone]

## 2019-01-24 NOTE — Progress Notes (Signed)
Careteam: Patient Care Team: [name], NP as PCP - General (Geriatric Medicine)  Advanced Directive information Does Patient Have a Medical Advance Directive?: No, Would patient like information on creating a medical advance directive?: Yes (MAU/Ambulatory/Procedural Areas - Information given)(Patient already has paperwork)  No Known Allergies  Chief Complaint  Patient presents with  . Medical Management of Chronic Issues    4 month follow-up   . Immunizations    Flu vaccine today   . Best Practice Recommendations    Discuss need for HIV Screening  . Quality Metric Gaps    Discuss need for PAP and Foot Exam Due      HPI: Patient is a 63 y.o. female seen in the office today for routine follow up. Mother passed away last week so she has been dealing with this.  Has been trying to eat better but since mother has died not eating well. Decrease appetite due to mother passing. Drinking protein shakes  Anemia- using ferrous sulfate daily  HTN- on quinapril; does not check blood pressure at home  DM- currently on tradjenta daily, metformin twice daily.  Has tried to eat better.  Hyperlipidemia- LDL 76 on 02/23/18; on lipitor. Has made dietary changes.   Asthma- denies any shortness of breath.states ongoing cough, some congestion that is her baseline. She takes singulair, Breo, prn albuterol HFA and astelin spray. Following with Dr Ernst Bowler. Has used albuterol more recently.   Seasonal allergies- ongoing cough, nasal congestion, sneezing due to allergies, taking claritin 10 mg daily which has been helpful. Uses atrovent nasal spray.   OA in left knee- continues to have pain- going to sports medicine doctor. Taking Voltaren for pain. Using zanaflex which helps.   Chronic migraines- ongoing, following with neurology. topiramate every night which has been beneficial.  Review of Systems:  Review of Systems  Constitutional: Negative for chills, fever and weight  loss.  HENT: Negative for tinnitus.   Respiratory: Negative for cough, sputum production and shortness of breath.   Cardiovascular: Negative for chest pain, palpitations and leg swelling.  Gastrointestinal: Negative for abdominal pain, constipation, diarrhea and heartburn.  Genitourinary: Negative for dysuria, frequency and urgency.  Musculoskeletal: Positive for joint pain. Negative for back pain, falls and myalgias.  Skin: Negative.   Neurological: Positive for headaches. Negative for dizziness.  Psychiatric/Behavioral: Negative for depression and memory loss. The patient does not have insomnia.     Past Medical History:  Diagnosis Date  . Asthma    QVAR daily and Albuterol as needed  . Bladder infection    taking Keflex daily   . Chronic back pain    reason unknown  . Diabetes mellitus    takes Metformin and Tradjenta daily  . GERD (gastroesophageal reflux disease)    takes Omeprazole daily  . Hard of hearing   . Headache    daily.Takes Excedrine daily  . History of blood transfusion    no abnormal reaction noted  . Hyperlipidemia    takes Atorvastatin daily  . Hypertension    takes Quinapril daily  . Joint pain   . Joint swelling   . Nocturia   . Osteoarthritis of left knee 02/23/2012  . Seasonal allergies    takes Claritin and Singulair daily.Nasal spray as needed  . Shortness of breath dyspnea    with exertion   Past Surgical History:  Procedure Laterality Date  . ABDOMINAL HYSTERECTOMY    . INJECTION KNEE  04/09/2012   Procedure: KNEE INJECTION;  Surgeon: Johnny Bridge, MD;  Location: [facility];  Service: Orthopedics;  Laterality: Left;  . JOINT REPLACEMENT     acl   bil knees  . KNEE ARTHROTOMY Left 08/28/2015   Procedure: LEFT KNEE ARTHROFIBROSIS EXCISION; pylectomy;  Surgeon: Meredith Pel, MD;  Location: [place];  Service: Orthopedics;  Laterality: Left;  . KNEE CLOSED REDUCTION  04/09/2012   Procedure: CLOSED MANIPULATION KNEE;   Surgeon: Johnny Bridge, MD;  Location: [place] [facility];  Service: Orthopedics;  Laterality: Left;  Manipulation Knee with Anesthesia includes Application of Traction   . TOTAL KNEE ARTHROPLASTY  02/23/2012   Procedure: TOTAL KNEE ARTHROPLASTY;  Surgeon: Johnny Bridge, MD;  Location: [place];  Service: Orthopedics;  Laterality: Left;   Social History:   reports that she has never smoked. She has never used smokeless tobacco. She reports that she does not drink alcohol or use drugs.  Family History  Problem Relation Age of Onset  . Bronchitis Father 100  . Alzheimer's disease Mother   . High blood pressure Sister   . High blood pressure Sister   . Headache Sister        pt thinks   . High blood pressure Sister   . High blood pressure Sister        pt thinks  . High blood pressure Sister   . High blood pressure Sister   . Headache Daughter   . Diabetes Daughter   . Diabetes Daughter   . Diabetes Other   . Colon cancer Neg Hx   . Esophageal cancer Neg Hx   . Rectal cancer Neg Hx   . Stomach cancer Neg Hx     Medications: Patient's Medications  New Prescriptions   No medications on file  Previous Medications   ALBUTEROL SULFATE (PROAIR RESPICLICK) 413 (90 BASE) MCG/ACT AEPB    Inhale 4 puffs into the lungs every 4 (four) hours as needed.   ASPIRIN-ACETAMINOPHEN-CAFFEINE (EXCEDRIN MIGRAINE) 250-250-65 MG PER TABLET    Take 2 tablets by mouth daily.   ATORVASTATIN (LIPITOR) 40 MG TABLET    TAKE 1 TABLET BY MOUTH EVERY DAY   AZELASTINE (ASTELIN) 0.1 % NASAL SPRAY    USE 1 SPRAY EACH NOSTRIL TWICE DAILY   BACLOFEN (LIORESAL) 10 MG TABLET    TAKE 1 TABLET BY MOUTH THREE TIMES A DAY AS NEEDED FOR MUSCLE SPASM   BENZONATATE (TESSALON PERLES) 100 MG CAPSULE    Take 1 capsule (100 mg total) by mouth 3 (three) times daily as needed for cough.   DICLOFENAC SODIUM (VOLTAREN) 1 % GEL    Apply 8 g topically 2 (two) times daily as needed. On knees   FERROUS SULFATE 325 (65 FE) MG  TABLET    TAKE 1 TABLET BY MOUTH EVERY DAY WITH BREAKFAST   FLUTICASONE (FLONASE) 50 MCG/ACT NASAL SPRAY    Place 2 sprays into both nostrils daily.   FLUTICASONE FUROATE-VILANTEROL (BREO ELLIPTA) 200-25 MCG/INH AEPB    Inhale 1 puff into the lungs daily.   IPRATROPIUM (ATROVENT) 0.03 % NASAL SPRAY    Place 2 sprays into both nostrils 2 (two) times daily.   METFORMIN (GLUCOPHAGE) 500 MG TABLET    TAKE ONE TABLET BY MOUTH TWICE DAILY TO CONTROL BLOOD SUGAR   MONTELUKAST (SINGULAIR) 10 MG TABLET    TAKE 1 TABLET BY MOUTH EVERYDAY AT BEDTIME   OMEPRAZOLE (PRILOSEC) 40 MG CAPSULE    TAKE 1 CAPSULE BY MOUTH EVERY DAY  ONE TOUCH ULTRA TEST TEST STRIP    Check blood sugar once daily as directed   PREDNISONE (DELTASONE) 10 MG TABLET    Take two tablets (74m) twice daily for three days, then one tablet (137m twice daily for three days, then STOP.   QUINAPRIL (ACCUPRIL) 20 MG TABLET    Take 1 tablet (20 mg total) by mouth at bedtime.   TIZANIDINE (ZANAFLEX) 4 MG TABLET    Take 1 tablet (4 mg total) by mouth every 6 (six) hours as needed for muscle spasms. For headaches.   TOPIRAMATE (TOPAMAX) 50 MG TABLET    Start with one pill(5038mat bedtime and increase to two pills(100m3mt bedtime in one week.   TRADJENTA 5 MG TABS TABLET    TAKE 1 TABLET BY MOUTH EVERY DAY  Modified Medications   No medications on file  Discontinued Medications   No medications on file    Physical Exam:  Vitals:   01/24/19 1029  BP: (!) 142/70  Pulse: 97  Temp: (!) 97.1 F (36.2 C)  TempSrc: Temporal  SpO2: 97%  Weight: 200 lb (90.7 kg)  Height: _0  (1.6 m)   Body mass index is 35.43 kg/m. Wt Readings from Last 3 Encounters:  01/24/19 200 lb (90.7 kg)  12/13/18 215 lb (97.5 kg)  02/23/18 204 lb 6.4 oz (92.7 kg)    Physical Exam Constitutional:      Appearance: Normal appearance. She is well-developed.  Eyes:     General: No scleral icterus.    Pupils: Pupils are equal, round, and reactive to light.   Neck:     Musculoskeletal: Neck supple.     Vascular: No carotid bruit.  Cardiovascular:     Rate and Rhythm: Normal rate and regular rhythm.     Heart sounds: Murmur (1/6 SEM) present. No friction rub. No gallop.      Comments: No LE edema b/l. no calf TTP.  Pulmonary:     Effort: Pulmonary effort is normal. No respiratory distress.     Breath sounds: Normal breath sounds. No stridor.  Abdominal:     General: Bowel sounds are normal.     Palpations: Abdomen is soft. Abdomen is not rigid. There is no hepatomegaly.  Skin:    General: Skin is warm and dry.     Findings: No rash.  Neurological:     Mental Status: She is alert and oriented to person, place, and time.     Gait: Gait abnormal (uses cane).  Psychiatric:        Mood and Affect: Affect is tearful (when talking about her mothers death).        Behavior: Behavior normal.        Thought Content: Thought content normal.        Judgment: Judgment normal.     Labs reviewed: Basic Metabolic Panel: Recent Labs    02/23/18 1109 12/13/18 1431  NA 140 145*  K 4.2 3.9  CL 104 105  CO2 25 19*  GLUCOSE 115 113*  BUN 9 10  CREATININE 0.80 0.78  CALCIUM 9.4 10.0  TSH  --  0.517   Liver Function Tests: Recent Labs    02/23/18 1109  AST 12  ALT 8  BILITOT 0.2  PROT 6.8   No results for input(s): LIPASE, AMYLASE in the last 8760 hours. No results for input(s): AMMONIA in the last 8760 hours. CBC: Recent Labs    02/23/18 1109 12/13/18 1431  WBC 6.3 7.6  NEUTROABS 3,868  --   HGB 10.1* 11.1  HCT 30.7* 32.7*  MCV 79.1* 76*  PLT 276 263   Lipid Panel: Recent Labs    02/23/18 1109  CHOL 140  HDL 52  LDLCALC 76  TRIG 41  CHOLHDL 2.7   TSH: Recent Labs    12/13/18 1431  TSH 0.517   A1C: Lab Results  Component Value Date   HGBA1C 6.4 (H) 02/23/2018     Assessment/Plan 1. Need for influenza vaccination - Flu Vaccine QUAD High Dose(Fluad)  2. Type 2 diabetes mellitus without complication,  without long-term current use of insulin ([place]) -denies hypoglycemia.  -Encouraged dietary compliance, routine foot care/monitoring and to keep up with diabetic eye exams through ophthalmology  - Flu Vaccine QUAD High Dose(Fluad) - Hemoglobin A1c  3. Intractable chronic migraine without aura and without status migrainosus Ongoing but improved with use of topamax at bedtime.  4. Microcytic anemia Continues on ferrous sulfate daily  - CBC with Differential/Platelet  5. Moderate persistent asthma without complication Having to use albuterol more recently but overall remains stable. Will continue current regimen but to notify if symptoms of cough and congestion worse. Denies worsening shortness of breath at this time.   6. Hyperlipidemia due to type 2 diabetes mellitus ([place]) Continues on lipitor 40 mg with dietary modifications.  - Lipid Panel - CMP with eGFR([facility])  7. Essential hypertension, benign Not at goal today however was very upset about her mothers recent passing. Will follow up at next visit. To continue current regmin.  - CMP with eGFR([facility])  8 Grief Having a hard time greiving the loss of her mother, discussed calling hospice for grief counselling  Next appt: 1 month for awv and cpe -needs pap-- sooner if needed  Mehtab Dolberry K. [place], [facility] [phone]

## 2019-01-25 ENCOUNTER — Other Ambulatory Visit: Payer: Self-pay | Admitting: Nurse Practitioner

## 2019-01-25 DIAGNOSIS — R748 Abnormal levels of other serum enzymes: Secondary | ICD-10-CM

## 2019-01-25 DIAGNOSIS — D649 Anemia, unspecified: Secondary | ICD-10-CM

## 2019-01-26 ENCOUNTER — Other Ambulatory Visit: Payer: Self-pay | Admitting: Nurse Practitioner

## 2019-01-26 DIAGNOSIS — R748 Abnormal levels of other serum enzymes: Secondary | ICD-10-CM

## 2019-01-26 LAB — CBC WITH DIFFERENTIAL/PLATELET
Absolute Monocytes: 904 cells/uL (ref 200–950)
Basophils Absolute: 99 cells/uL (ref 0–200)
Basophils Relative: 1.3 %
Eosinophils Absolute: 775 cells/uL — ABNORMAL HIGH (ref 15–500)
Eosinophils Relative: 10.2 %
HCT: 29.9 % — ABNORMAL LOW (ref 35.0–45.0)
Hemoglobin: 9.9 g/dL — ABNORMAL LOW (ref 11.7–15.5)
Lymphs Abs: 1999 cells/uL (ref 850–3900)
MCH: 25.8 pg — ABNORMAL LOW (ref 27.0–33.0)
MCHC: 33.1 g/dL (ref 32.0–36.0)
MCV: 77.9 fL — ABNORMAL LOW (ref 80.0–100.0)
MPV: 12.3 fL (ref 7.5–12.5)
Monocytes Relative: 11.9 %
Neutro Abs: 3823 cells/uL (ref 1500–7800)
Neutrophils Relative %: 50.3 %
Platelets: 308 10*3/uL (ref 140–400)
RBC: 3.84 10*6/uL (ref 3.80–5.10)
RDW: 14.6 % (ref 11.0–15.0)
Total Lymphocyte: 26.3 %
WBC: 7.6 10*3/uL (ref 3.8–10.8)

## 2019-01-26 LAB — HEMOGLOBIN A1C
Hgb A1c MFr Bld: 6.9 % of total Hgb — ABNORMAL HIGH (ref <5.7)
Mean Plasma Glucose: 151 (calc)
eAG (mmol/L): 8.4 (calc)

## 2019-01-26 LAB — IRON,TIBC AND FERRITIN PANEL
%SAT: 20 % (calc) (ref 16–45)
Ferritin: 549 ng/mL — ABNORMAL HIGH (ref 16–288)
Iron: 51 ug/dL (ref 45–160)
TIBC: 261 mcg/dL (calc) (ref 250–450)

## 2019-01-26 LAB — LIPID PANEL
Cholesterol: 110 mg/dL (ref <200)
HDL: 13 mg/dL — ABNORMAL LOW (ref >=50)
LDL Cholesterol (Calc): 72 mg/dL (calc)
Non-HDL Cholesterol (Calc): 97 mg/dL (calc) (ref <130)
Total CHOL/HDL Ratio: 8.5 (calc) — ABNORMAL HIGH (ref <5.0)
Triglycerides: 168 mg/dL — ABNORMAL HIGH (ref <150)

## 2019-01-26 LAB — COMPLETE METABOLIC PANEL WITH GFR
AG Ratio: 1.1 (calc) (ref 1.0–2.5)
ALT: 832 U/L — ABNORMAL HIGH (ref 6–29)
AST: 485 U/L — ABNORMAL HIGH (ref 10–35)
Albumin: 4 g/dL (ref 3.6–5.1)
Alkaline phosphatase (APISO): 146 U/L (ref 37–153)
BUN/Creatinine Ratio: 11 (calc) (ref 6–22)
BUN: 14 mg/dL (ref 7–25)
CO2: 23 mmol/L (ref 20–32)
Calcium: 9.8 mg/dL (ref 8.6–10.4)
Chloride: 103 mmol/L (ref 98–110)
Creat: 1.28 mg/dL — ABNORMAL HIGH (ref 0.50–0.99)
GFR, Est African American: 51 mL/min/{1.73_m2} — ABNORMAL LOW (ref >=60)
GFR, Est Non African American: 44 mL/min/{1.73_m2} — ABNORMAL LOW (ref >=60)
Globulin: 3.5 g/dL (calc) (ref 1.9–3.7)
Glucose, Bld: 175 mg/dL — ABNORMAL HIGH (ref 65–139)
Potassium: 3.8 mmol/L (ref 3.5–5.3)
Sodium: 137 mmol/L (ref 135–146)
Total Bilirubin: 1 mg/dL (ref 0.2–1.2)
Total Protein: 7.5 g/dL (ref 6.1–8.1)

## 2019-01-26 LAB — LIPASE: Lipase: 19 U/L (ref 7–60)

## 2019-01-26 LAB — TEST AUTHORIZATION

## 2019-01-26 LAB — HEPATITIS PANEL, ACUTE
Hep A IgM: NONREACTIVE
Hep B C IgM: NONREACTIVE
Hepatitis B Surface Ag: NONREACTIVE
Hepatitis C Ab: NONREACTIVE
SIGNAL TO CUT-OFF: 0.03 (ref <1.00)

## 2019-01-26 LAB — AMYLASE: Amylase: 28 U/L (ref 21–101)

## 2019-01-28 ENCOUNTER — Other Ambulatory Visit: Payer: Medicare Other

## 2019-01-28 ENCOUNTER — Other Ambulatory Visit: Payer: Self-pay

## 2019-01-28 DIAGNOSIS — R748 Abnormal levels of other serum enzymes: Secondary | ICD-10-CM

## 2019-01-28 DIAGNOSIS — D649 Anemia, unspecified: Secondary | ICD-10-CM

## 2019-02-02 LAB — CBC WITH DIFFERENTIAL/PLATELET
Absolute Monocytes: 1193 cells/uL — ABNORMAL HIGH (ref 200–950)
Basophils Absolute: 136 cells/uL (ref 0–200)
Basophils Relative: 1.4 %
Eosinophils Absolute: 1242 cells/uL — ABNORMAL HIGH (ref 15–500)
Eosinophils Relative: 12.8 %
HCT: 30.3 % — ABNORMAL LOW (ref 35.0–45.0)
Hemoglobin: 9.6 g/dL — ABNORMAL LOW (ref 11.7–15.5)
Lymphs Abs: 3036 cells/uL (ref 850–3900)
MCH: 25.8 pg — ABNORMAL LOW (ref 27.0–33.0)
MCHC: 31.7 g/dL — ABNORMAL LOW (ref 32.0–36.0)
MCV: 81.5 fL (ref 80.0–100.0)
MPV: 12.4 fL (ref 7.5–12.5)
Monocytes Relative: 12.3 %
Neutro Abs: 4093 cells/uL (ref 1500–7800)
Neutrophils Relative %: 42.2 %
Platelets: 415 10*3/uL — ABNORMAL HIGH (ref 140–400)
RBC: 3.72 10*6/uL — ABNORMAL LOW (ref 3.80–5.10)
RDW: 15.8 % — ABNORMAL HIGH (ref 11.0–15.0)
Total Lymphocyte: 31.3 %
WBC: 9.7 10*3/uL (ref 3.8–10.8)

## 2019-02-02 LAB — HEPATIC FUNCTION PANEL
AG Ratio: 1.1 (calc) (ref 1.0–2.5)
ALT: 728 U/L — ABNORMAL HIGH (ref 6–29)
AST: 583 U/L — ABNORMAL HIGH (ref 10–35)
Albumin: 3.9 g/dL (ref 3.6–5.1)
Alkaline phosphatase (APISO): 149 U/L (ref 37–153)
Bilirubin, Direct: 0.4 mg/dL — ABNORMAL HIGH (ref 0.0–0.2)
Globulin: 3.4 g/dL (calc) (ref 1.9–3.7)
Indirect Bilirubin: 0.7 mg/dL (calc) (ref 0.2–1.2)
Total Bilirubin: 1.1 mg/dL (ref 0.2–1.2)
Total Protein: 7.3 g/dL (ref 6.1–8.1)

## 2019-02-02 LAB — ACETAMINOPHEN LEVEL: Acetaminophen (Tylenol), Serum: <10 mg/L — ABNORMAL LOW (ref 10–20)

## 2019-02-03 ENCOUNTER — Other Ambulatory Visit: Payer: Self-pay | Admitting: Nurse Practitioner

## 2019-02-03 DIAGNOSIS — R748 Abnormal levels of other serum enzymes: Secondary | ICD-10-CM

## 2019-02-04 ENCOUNTER — Ambulatory Visit
Admission: RE | Admit: 2019-02-04 | Discharge: 2019-02-04 | Disposition: A | Discharge status: Home / Self Care | Payer: Medicare Other | Source: Ambulatory Visit | Attending: Nurse Practitioner | Admitting: Nurse Practitioner

## 2019-02-04 DIAGNOSIS — R748 Abnormal levels of other serum enzymes: Secondary | ICD-10-CM

## 2019-02-08 ENCOUNTER — Emergency Department (HOSPITAL_COMMUNITY)
Admission: EM | Admit: 2019-02-08 | Discharge: 2019-02-08 | Disposition: A | Discharge status: Home / Self Care | Payer: Medicare Other | Attending: Emergency Medicine | Admitting: Emergency Medicine

## 2019-02-08 ENCOUNTER — Other Ambulatory Visit: Payer: Self-pay

## 2019-02-08 ENCOUNTER — Telehealth: Payer: Self-pay | Admitting: *Deleted

## 2019-02-08 ENCOUNTER — Emergency Department (HOSPITAL_COMMUNITY): Payer: Medicare Other

## 2019-02-08 DIAGNOSIS — Z96653 Presence of artificial knee joint, bilateral: Secondary | ICD-10-CM | POA: Diagnosis not present

## 2019-02-08 DIAGNOSIS — I10 Essential (primary) hypertension: Secondary | ICD-10-CM | POA: Insufficient documentation

## 2019-02-08 DIAGNOSIS — Z7984 Long term (current) use of oral hypoglycemic drugs: Secondary | ICD-10-CM | POA: Diagnosis not present

## 2019-02-08 DIAGNOSIS — E119 Type 2 diabetes mellitus without complications: Secondary | ICD-10-CM | POA: Insufficient documentation

## 2019-02-08 DIAGNOSIS — B199 Unspecified viral hepatitis without hepatic coma: Secondary | ICD-10-CM | POA: Insufficient documentation

## 2019-02-08 DIAGNOSIS — R1084 Generalized abdominal pain: Secondary | ICD-10-CM | POA: Diagnosis not present

## 2019-02-08 DIAGNOSIS — R112 Nausea with vomiting, unspecified: Secondary | ICD-10-CM | POA: Diagnosis present

## 2019-02-08 DIAGNOSIS — Z79899 Other long term (current) drug therapy: Secondary | ICD-10-CM | POA: Diagnosis not present

## 2019-02-08 DIAGNOSIS — K759 Inflammatory liver disease, unspecified: Secondary | ICD-10-CM

## 2019-02-08 DIAGNOSIS — R110 Nausea: Secondary | ICD-10-CM

## 2019-02-08 DIAGNOSIS — J45909 Unspecified asthma, uncomplicated: Secondary | ICD-10-CM | POA: Diagnosis not present

## 2019-02-08 LAB — BILIRUBIN, DIRECT: Bilirubin, Direct: 1.5 mg/dL — ABNORMAL HIGH (ref 0.0–0.2)

## 2019-02-08 LAB — COMPREHENSIVE METABOLIC PANEL
ALT: 503 U/L — ABNORMAL HIGH (ref 0–44)
AST: 510 U/L — ABNORMAL HIGH (ref 15–41)
Albumin: 3.8 g/dL (ref 3.5–5.0)
Alkaline Phosphatase: 176 U/L — ABNORMAL HIGH (ref 38–126)
Anion gap: 9 (ref 5–15)
BUN: 18 mg/dL (ref 8–23)
CO2: 24 mmol/L (ref 22–32)
Calcium: 9.8 mg/dL (ref 8.9–10.3)
Chloride: 110 mmol/L (ref 98–111)
Creatinine, Ser: 1.08 mg/dL — ABNORMAL HIGH (ref 0.44–1.00)
GFR calc Af Amer: >60 mL/min (ref >60)
GFR calc non Af Amer: 54 mL/min — ABNORMAL LOW (ref >60)
Glucose, Bld: 123 mg/dL — ABNORMAL HIGH (ref 70–99)
Potassium: 3.5 mmol/L (ref 3.5–5.1)
Sodium: 143 mmol/L (ref 135–145)
Total Bilirubin: 3.2 mg/dL — ABNORMAL HIGH (ref 0.3–1.2)
Total Protein: 8.8 g/dL — ABNORMAL HIGH (ref 6.5–8.1)

## 2019-02-08 LAB — CBC
HCT: 34.4 % — ABNORMAL LOW (ref 36.0–46.0)
Hemoglobin: 11.1 g/dL — ABNORMAL LOW (ref 12.0–15.0)
MCH: 25.6 pg — ABNORMAL LOW (ref 26.0–34.0)
MCHC: 32.3 g/dL (ref 30.0–36.0)
MCV: 79.3 fL — ABNORMAL LOW (ref 80.0–100.0)
Platelets: 314 10*3/uL (ref 150–400)
RBC: 4.34 MIL/uL (ref 3.87–5.11)
RDW: 17.2 % — ABNORMAL HIGH (ref 11.5–15.5)
WBC: 9.9 10*3/uL (ref 4.0–10.5)
nRBC: 0 % (ref 0.0–0.2)

## 2019-02-08 LAB — URINALYSIS, ROUTINE W REFLEX MICROSCOPIC
Bilirubin Urine: NEGATIVE
Glucose, UA: NEGATIVE mg/dL
Hgb urine dipstick: NEGATIVE
Ketones, ur: NEGATIVE mg/dL
Nitrite: NEGATIVE
Protein, ur: NEGATIVE mg/dL
Specific Gravity, Urine: 1.016 (ref 1.005–1.030)
pH: 5 (ref 5.0–8.0)

## 2019-02-08 LAB — LIPASE, BLOOD: Lipase: 32 U/L (ref 11–51)

## 2019-02-08 MED ORDER — IOHEXOL 300 MG/ML  SOLN
100.0000 mL | Freq: Once | INTRAMUSCULAR | Status: AC | PRN
  Administered 2019-02-08: 20:00:00 100 mL via INTRAVENOUS

## 2019-02-08 MED ORDER — SODIUM CHLORIDE 0.9 % IV SOLN: 1000.0000 mL | INTRAVENOUS | Status: DC

## 2019-02-08 MED ORDER — ONDANSETRON 8 MG PO TBDP: 8.0000 mg | ORAL_TABLET | Freq: Three times a day (TID) | ORAL | 0 refills | Status: DC | PRN

## 2019-02-08 MED ORDER — ONDANSETRON HCL 4 MG/2ML IJ SOLN
4.0000 mg | Freq: Once | INTRAMUSCULAR | Status: AC
  Administered 2019-02-08: 19:00:00 4 mg via INTRAVENOUS
  Filled 2019-02-08: qty 2

## 2019-02-08 MED ORDER — SODIUM CHLORIDE (PF) 0.9 % IJ SOLN
INTRAMUSCULAR | Status: AC
  Filled 2019-02-08: qty 50

## 2019-02-08 MED ORDER — SODIUM CHLORIDE 0.9 % IV BOLUS (SEPSIS)
1000.0000 mL | Freq: Once | INTRAVENOUS | Status: AC
  Administered 2019-02-08: 19:00:00 1000 mL via INTRAVENOUS

## 2019-02-08 MED ORDER — SODIUM CHLORIDE 0.9% FLUSH: 3.0000 mL | Freq: Once | INTRAVENOUS | Status: DC

## 2019-02-08 NOTE — Telephone Encounter (Signed)
I returned the call to Ms Martello, to inform her that Janett Billow wants her to go straight to the Emergency Room. She stated that she understood an was going to [facility].

## 2019-02-08 NOTE — ED Triage Notes (Signed)
Pt arrived POV to the ED ambulatory with cane c/o NVD chronic referred here by PCP. Pt reports weight loss and fatigue. VSS Pt has HTN( 158/82)  Afebrile.

## 2019-02-08 NOTE — Telephone Encounter (Signed)
Due to her elevated liver enzymes with all these symptoms would recommend her to going to the ED for further evaluation at this time. She also sounds significantly dehydration

## 2019-02-08 NOTE — Discharge Instructions (Addendum)
Stop taking your  Lipitor.  Follow-up with Dr. Lyndel Safe.  Take the Zofran as needed for nausea.

## 2019-02-08 NOTE — Telephone Encounter (Signed)
Having a lot of nausea, vomiting and diarrhea x 1 month.. She stated that she can't even keep water, saltine cracker down. . She stated that she is losing weight, and think that she is dehydrated,  can only lie down all day. Please Advise!

## 2019-02-08 NOTE — ED Notes (Signed)
Patient requested wheelchair to get to her car. Patient given option by RN and daughter to have daughter drive her home, but patient insisted that she was okay to drive her car home because she "just lives right down the road".

## 2019-02-08 NOTE — ED Provider Notes (Signed)
[facility] DEPT Provider Note   CSN: [id number] Arrival date & time: 02/08/19  1223     History   Chief Complaint Chief Complaint  Patient presents with   Nausea   Emesis    HPI Tabitha Oneal is a 63 y.o. female.     HPI Pt has been having trouble with nausea and vomiting and abdominal pain off and on for the last month.  Tabitha Oneal has been having issues with nausea and vomiting.   Tabitha Oneal has been vomiting several times per day.  Tabitha Oneal has had diarrhea, twice per day as well.  No blood in emesis or diarrhea.  Tabitha Oneal is having diffuse abd pain for the last month.  It is around the whole front of her abdomen.  It gets with certain positions.  Tabitha Oneal has not been able to eat well.  Tabitha Oneal saw her doctor about a week ago Past Medical History:  Diagnosis Date   Asthma    QVAR daily and Albuterol as needed   Bladder infection    taking Keflex daily    Chronic back pain    reason unknown   Diabetes mellitus    takes Metformin and Tradjenta daily   GERD (gastroesophageal reflux disease)    takes Omeprazole daily   Hard of hearing    Headache    daily.Takes Excedrine daily   History of blood transfusion    no abnormal reaction noted   Hyperlipidemia    takes Atorvastatin daily   Hypertension    takes Quinapril daily   Joint pain    Joint swelling    Nocturia    Osteoarthritis of left knee 02/23/2012   Seasonal allergies    takes Claritin and Singulair daily.Nasal spray as needed   Shortness of breath dyspnea    with exertion    Patient Active Problem List   Diagnosis Date Noted   Rebound headache 12/13/2018   Overuse of medication 12/13/2018   Intractable chronic migraine without aura and without status migrainosus 12/13/2018   Microcytic anemia 02/24/2018   High risk medication use 08/06/2017   Seasonal and perennial allergic rhinitis 07/30/2017   Recurrent infections 07/30/2017   Cough 06/08/2017   Moderate  persistent asthma without complication [id number]   Chronic constipation 02/04/2017   Hyperlipidemia due to type 2 diabetes mellitus ([place]) 02/04/2017   Hallux rigidus, left foot 01/01/2017   Postmenopausal symptoms 02/22/2016   Arthrofibrosis of knee joint 08/28/2015   Left hip pain 10/08/2014   Type 2 diabetes mellitus without complication, without long-term current use of insulin ([place]) 10/08/2014   Hyperlipidemia LDL goal <100 10/08/2014   Essential hypertension, benign 08/30/2014   Diabetes mellitus, type 2 ([place]) 08/30/2014   Diverticulosis of colon without hemorrhage 08/30/2014   Chronic low back pain 08/30/2014   Chronic pain syndrome 08/30/2014   Asthma in adult without complication [id number]   Contracture of left knee, arthrofibrosis post op TKA 04/09/2012   Osteoarthritis of left knee 02/23/2012    Past Surgical History:  Procedure Laterality Date   ABDOMINAL HYSTERECTOMY     INJECTION KNEE  04/09/2012   Procedure: KNEE INJECTION;  Surgeon: Johnny Bridge, MD;  Location: [place];  Service: Orthopedics;  Laterality: Left;   JOINT REPLACEMENT     acl   bil knees   KNEE ARTHROTOMY Left 08/28/2015   Procedure: LEFT KNEE ARTHROFIBROSIS EXCISION; pylectomy;  Surgeon: Meredith Pel, MD;  Location: [facility];  Service: Orthopedics;  Laterality: Left;  KNEE CLOSED REDUCTION  04/09/2012   Procedure: CLOSED MANIPULATION KNEE;  Surgeon: Johnny Bridge, MD;  Location: [facility];  Service: Orthopedics;  Laterality: Left;  Manipulation Knee with Anesthesia includes Application of Traction    TOTAL KNEE ARTHROPLASTY  02/23/2012   Procedure: TOTAL KNEE ARTHROPLASTY;  Surgeon: Johnny Bridge, MD;  Location: [facility];  Service: Orthopedics;  Laterality: Left;     OB History   No obstetric history on file.      Home Medications    Prior to Admission medications   Medication Sig Start Date End Date Taking? Authorizing Provider    Albuterol Sulfate (PROAIR RESPICLICK) 123XX123 (90 Base) MCG/ACT AEPB Inhale 4 puffs into the lungs every 4 (four) hours as needed. 12/01/18   Valentina Shaggy, MD  aspirin-acetaminophen-caffeine (EXCEDRIN MIGRAINE) [phone] MG per tablet Take 2 tablets by mouth daily.    [provider]  atorvastatin (LIPITOR) 40 MG tablet TAKE 1 TABLET BY MOUTH EVERY DAY 12/14/18   [name], NP  azelastine (ASTELIN) 0.1 % nasal spray USE 1 SPRAY EACH NOSTRIL TWICE DAILY 12/01/18   Valentina Shaggy, MD  baclofen (LIORESAL) 10 MG tablet TAKE 1 TABLET BY MOUTH THREE TIMES A DAY AS NEEDED FOR MUSCLE SPASM 11/03/18   [provider]  benzonatate (TESSALON PERLES) 100 MG capsule Take 1 capsule (100 mg total) by mouth 3 (three) times daily as needed for cough. 12/29/18   Valentina Shaggy, MD  diclofenac sodium (VOLTAREN) 1 % GEL Apply 8 g topically 2 (two) times daily as needed. On knees    [provider]  ferrous sulfate 325 (65 FE) MG tablet TAKE 1 TABLET BY MOUTH EVERY DAY WITH BREAKFAST 09/28/18   [name], NP  fluticasone (FLONASE) 50 MCG/ACT nasal spray Place 2 sprays into both nostrils daily. 12/01/18   Valentina Shaggy, MD  fluticasone furoate-vilanterol (BREO ELLIPTA) 200-25 MCG/INH AEPB Inhale 1 puff into the lungs daily. 12/01/18   Valentina Shaggy, MD  ipratropium (ATROVENT) 0.03 % nasal spray Place 2 sprays into both nostrils 2 (two) times daily. 12/01/18   Valentina Shaggy, MD  metFORMIN (GLUCOPHAGE) 500 MG tablet TAKE ONE TABLET BY MOUTH TWICE DAILY TO CONTROL BLOOD SUGAR 11/22/18   [name], NP  montelukast (SINGULAIR) 10 MG tablet TAKE 1 TABLET BY MOUTH EVERYDAY AT BEDTIME 12/01/18   Valentina Shaggy, MD  omeprazole (PRILOSEC) 40 MG capsule TAKE 1 CAPSULE BY MOUTH EVERY DAY 09/28/18   [name], NP  ondansetron (ZOFRAN ODT) 8 MG disintegrating tablet Take 1 tablet (8 mg total) by mouth every 8 (eight) hours as needed for nausea  or vomiting. 02/08/19   Dorie Rank, MD  ONE TOUCH ULTRA TEST test strip Check blood sugar once daily as directed 08/04/14   [provider]  predniSONE (DELTASONE) 10 MG tablet Take two tablets (20mg ) twice daily for three days, then one tablet (10mg ) twice daily for three days, then STOP. 12/29/18   Valentina Shaggy, MD  quinapril (ACCUPRIL) 20 MG tablet Take 1 tablet (20 mg total) by mouth at bedtime. 12/08/18   [name], NP  tiZANidine (ZANAFLEX) 4 MG tablet Take 1 tablet (4 mg total) by mouth every 6 (six) hours as needed for muscle spasms. For headaches. 12/13/18   [name], MD  topiramate (TOPAMAX) 50 MG tablet Start with one pill(50mg ) at bedtime and increase to two pills(100mg ) at bedtime in one week. 12/13/18  [name], MD  TRADJENTA 5 MG TABS tablet TAKE 1 TABLET BY MOUTH EVERY DAY 10/25/18   [name], NP    Family History Family History  Problem Relation Age of Onset   Bronchitis Father 81   Alzheimer's disease Mother    High blood pressure Sister    High blood pressure Sister    Headache Sister        pt thinks    High blood pressure Sister    High blood pressure Sister        pt thinks   High blood pressure Sister    High blood pressure Sister    Headache Daughter    Diabetes Daughter    Diabetes Daughter    Diabetes Other    Colon cancer Neg Hx    Esophageal cancer Neg Hx    Rectal cancer Neg Hx    Stomach cancer Neg Hx     Social History Social History   Tobacco Use   Smoking status: Never Smoker   Smokeless tobacco: Never Used  Substance Use Topics   Alcohol use: No    Alcohol/week: 0.0 standard drinks   Drug use: No     Allergies   Patient has no known allergies.   Review of Systems Review of Systems  All other systems reviewed and are negative.    Physical Exam Updated Vital Signs BP (!) 166/75    Pulse 80    Temp 98.4 F (36.9 C) (Oral)    Resp 16    SpO2 100%   Physical  Exam Vitals signs and nursing note reviewed.  Constitutional:      General: Tabitha Oneal is not in acute distress.    Appearance: Tabitha Oneal is well-developed.  HENT:     Head: Normocephalic and atraumatic.     Right Ear: External ear normal.     Left Ear: External ear normal.  Eyes:     General: No scleral icterus.       Right eye: No discharge.        Left eye: No discharge.     Conjunctiva/sclera: Conjunctivae normal.  Neck:     Musculoskeletal: Neck supple.     Trachea: No tracheal deviation.  Cardiovascular:     Rate and Rhythm: Normal rate and regular rhythm.  Pulmonary:     Effort: Pulmonary effort is normal. No respiratory distress.     Breath sounds: Normal breath sounds. No stridor. No wheezing or rales.  Abdominal:     General: Bowel sounds are normal. There is no distension.     Palpations: Abdomen is soft.     Tenderness: There is no abdominal tenderness. There is no guarding or rebound.  Musculoskeletal:        General: No tenderness.  Skin:    General: Skin is warm and dry.     Findings: No rash.  Neurological:     Mental Status: Tabitha Oneal is alert.     Cranial Nerves: No cranial nerve deficit (no facial droop, extraocular movements intact, no slurred speech).     Sensory: No sensory deficit.     Motor: No abnormal muscle tone or seizure activity.     Coordination: Coordination normal.      ED Treatments / Results  Labs (all labs ordered are listed, but only abnormal results are displayed) Labs Reviewed  COMPREHENSIVE METABOLIC PANEL - Abnormal; Notable for the following components:      Result Value   Glucose, Bld 123 (*)  Creatinine, Ser 1.08 (*)    Total Protein 8.8 (*)    AST 510 (*)    ALT 503 (*)    Alkaline Phosphatase 176 (*)    Total Bilirubin 3.2 (*)    GFR calc non Af Amer 54 (*)    All other components within normal limits  CBC - Abnormal; Notable for the following components:   Hemoglobin 11.1 (*)    HCT 34.4 (*)    MCV 79.3 (*)    MCH 25.6 (*)     RDW 17.2 (*)    All other components within normal limits  URINALYSIS, ROUTINE W REFLEX MICROSCOPIC - Abnormal; Notable for the following components:   Color, Urine AMBER (*)    APPearance HAZY (*)    Leukocytes,Ua MODERATE (*)    Bacteria, UA RARE (*)    All other components within normal limits  LIPASE, BLOOD  HEPATITIS PANEL, ACUTE  BILIRUBIN, DIRECT  ANTI-SMOOTH MUSCLE ANTIBODY, IGG  MITOCHONDRIAL ANTIBODIES  CERULOPLASMIN  ALPHA-1-ANTITRYPSIN  FERRITIN  IRON  IRON AND TIBC  IMMUNOGLOBULINS A/E/G/M, SERUM  GLIADIN ANTIBODIES, SERUM  TISSUE TRANSGLUTAMINASE, IGA  RETICULIN ANTIBODIES, IGA W TITER    EKG None  Radiology Ct Abdomen Pelvis W Contrast  Result Date: 02/08/2019 CLINICAL DATA:  63 year old female with acute abdominal pain with nausea, vomiting and diarrhea. EXAM: CT ABDOMEN AND PELVIS WITH CONTRAST TECHNIQUE: Multidetector CT imaging of the abdomen and pelvis was performed using the standard protocol following bolus administration of intravenous contrast. CONTRAST:  136mL OMNIPAQUE IOHEXOL 300 MG/ML  SOLN COMPARISON:  07/30/2014 CT FINDINGS: Lower chest: No acute abnormality. Mild bibasilar scarring again noted. Hepatobiliary: The liver and gallbladder are unremarkable. No biliary dilatation. Pancreas: Unremarkable Spleen: Unremarkable Adrenals/Urinary Tract: The kidneys, adrenal glands and bladder are unremarkable. Stomach/Bowel: Stomach is within normal limits. Appendix appears normal. No evidence of bowel wall thickening, distention, or inflammatory changes. Vascular/Lymphatic: Aortic atherosclerosis. No enlarged abdominal or pelvic lymph nodes. Reproductive: Status post hysterectomy. No adnexal masses. Other: No ascites, focal collection or pneumoperitoneum. Musculoskeletal: No acute or suspicious bony abnormalities noted. Mild degenerative disc disease/spondylosis in the LOWER lumbar spine noted. IMPRESSION: 1. No evidence of acute abnormality. No CT findings to  suggest a cause for this patient's abdominal pain. 2.  Aortic Atherosclerosis (ICD10-I70.0). Electronically Signed   By: Margarette Canada M.D.   On: 02/08/2019 19:54    Procedures Procedures (including critical care time)  Medications Ordered in ED Medications  sodium chloride 0.9 % bolus 1,000 mL (0 mLs Intravenous Stopped 02/08/19 2109)    Followed by  0.9 %  sodium chloride infusion (has no administration in time range)  sodium chloride (PF) 0.9 % injection (has no administration in time range)  ondansetron (ZOFRAN) injection 4 mg (4 mg Intravenous Given 02/08/19 1910)  iohexol (OMNIPAQUE) 300 MG/ML solution 100 mL (100 mLs Intravenous Contrast Given 02/08/19 1933)     Initial Impression / Assessment and Plan / ED Course  I have reviewed the triage vital signs and the nursing notes.  Pertinent labs & imaging results that were available during my care of the patient were reviewed by me and considered in my medical decision making (see chart for details).   Patient's laboratory tests are notable for worsening hepatitis.  CT scan does not show any evidence of mass or obstruction.  Patient improved after IV fluids and antinausea medications.  I discussed the findings with the patient and her daughter.  Consult with Dr. Lyndel Safe, gastroenterology.  He recommended additional laboratory  tests which I have added on this evening.  Plan on outpatient follow-up with him for further evaluation.  Final Clinical Impressions(s) / ED Diagnoses   Final diagnoses:  Nausea  Hepatitis    ED Discharge Orders         Ordered    ondansetron (ZOFRAN ODT) 8 MG disintegrating tablet  Every 8 hours PRN     02/08/19 2120           Dorie Rank, MD 02/08/19 2121

## 2019-02-09 ENCOUNTER — Encounter: Payer: Self-pay | Admitting: Gastroenterology

## 2019-02-10 ENCOUNTER — Telehealth: Payer: Self-pay | Admitting: *Deleted

## 2019-02-10 LAB — ALPHA-1-ANTITRYPSIN: A-1 Antitrypsin, Ser: 183 mg/dL (ref 101–187)

## 2019-02-10 LAB — GLIADIN ANTIBODIES, SERUM
Antigliadin Abs, IgA: 4 units (ref 0–19)
Gliadin IgG: 4 units (ref 0–19)

## 2019-02-10 LAB — TISSUE TRANSGLUTAMINASE, IGA: Tissue Transglutaminase Ab, IgA: <2 U/mL (ref 0–3)

## 2019-02-10 LAB — MITOCHONDRIAL ANTIBODIES: Mitochondrial M2 Ab, IgG: <20 Units (ref 0.0–20.0)

## 2019-02-10 LAB — ANTI-SMOOTH MUSCLE ANTIBODY, IGG: F-Actin IgG: 19 Units (ref 0–19)

## 2019-02-10 LAB — HEPATITIS PANEL, ACUTE
HCV Ab: <0.1 s/co ratio (ref 0.0–0.9)
Hep A IgM: NEGATIVE
Hep B C IgM: NEGATIVE
Hepatitis B Surface Ag: NEGATIVE

## 2019-02-10 LAB — CERULOPLASMIN: Ceruloplasmin: 37.7 mg/dL (ref 19.0–39.0)

## 2019-02-10 NOTE — Telephone Encounter (Signed)
Tabitha Chandler, NP  Rafael Bihari A, CMA        Can we have her make appt to follow up ED visit. Also they talk about her following up with GI, can you see if she needs a new referral or if she is able to make this without.  Thank you

## 2019-02-11 ENCOUNTER — Other Ambulatory Visit: Payer: Medicare Other

## 2019-02-11 ENCOUNTER — Other Ambulatory Visit: Payer: Self-pay

## 2019-02-11 DIAGNOSIS — R748 Abnormal levels of other serum enzymes: Secondary | ICD-10-CM

## 2019-02-11 LAB — IMMUNOGLOBULINS A/E/G/M, SERUM
IgA: 181 mg/dL (ref 87–352)
IgE (Immunoglobulin E), Serum: 40 IU/mL (ref 6–495)
IgG (Immunoglobin G), Serum: 2500 mg/dL — ABNORMAL HIGH (ref 586–1602)
IgM (Immunoglobulin M), Srm: 48 mg/dL (ref 26–217)

## 2019-02-11 LAB — RETICULIN ANTIBODIES, IGA W TITER: Reticulin Ab, IgA: NEGATIVE titer (ref <2.5)

## 2019-02-11 NOTE — Telephone Encounter (Signed)
Patient has an appointment with Dr. Lyndel Safe with [facility] on 02/24/2019.  Tried calling patient to schedule a ER Follow up. LMOM to return call.

## 2019-02-11 NOTE — Telephone Encounter (Signed)
Patient scheduled an appointment for Monday with Dinah for ER Follow up

## 2019-02-12 LAB — COMPLETE METABOLIC PANEL WITH GFR
AG Ratio: 1 (calc) (ref 1.0–2.5)
ALT: 326 U/L — ABNORMAL HIGH (ref 6–29)
AST: 310 U/L — ABNORMAL HIGH (ref 10–35)
Albumin: 3.6 g/dL (ref 3.6–5.1)
Alkaline phosphatase (APISO): 156 U/L — ABNORMAL HIGH (ref 37–153)
BUN/Creatinine Ratio: 12 (calc) (ref 6–22)
BUN: 13 mg/dL (ref 7–25)
CO2: 22 mmol/L (ref 20–32)
Calcium: 9.6 mg/dL (ref 8.6–10.4)
Chloride: 108 mmol/L (ref 98–110)
Creat: 1.12 mg/dL — ABNORMAL HIGH (ref 0.50–0.99)
GFR, Est African American: 60 mL/min/{1.73_m2} (ref >=60)
GFR, Est Non African American: 52 mL/min/{1.73_m2} — ABNORMAL LOW (ref >=60)
Globulin: 3.7 g/dL (calc) (ref 1.9–3.7)
Glucose, Bld: 128 mg/dL — ABNORMAL HIGH (ref 65–99)
Potassium: 3.8 mmol/L (ref 3.5–5.3)
Sodium: 141 mmol/L (ref 135–146)
Total Bilirubin: 2.1 mg/dL — ABNORMAL HIGH (ref 0.2–1.2)
Total Protein: 7.3 g/dL (ref 6.1–8.1)

## 2019-02-14 ENCOUNTER — Ambulatory Visit: Payer: Self-pay

## 2019-02-14 ENCOUNTER — Encounter: Payer: Medicare Other | Admitting: Nurse Practitioner

## 2019-02-14 ENCOUNTER — Encounter: Payer: Self-pay | Admitting: Family

## 2019-02-14 ENCOUNTER — Other Ambulatory Visit: Payer: Self-pay

## 2019-02-14 ENCOUNTER — Ambulatory Visit (INDEPENDENT_AMBULATORY_CARE_PROVIDER_SITE_OTHER): Payer: Medicare Other | Admitting: Family

## 2019-02-14 VITALS — BP 144/78 | HR 83 | Temp 97.1°F | Ht 63.0 in | Wt 192.2 lb

## 2019-02-14 DIAGNOSIS — K5901 Slow transit constipation: Secondary | ICD-10-CM | POA: Diagnosis not present

## 2019-02-14 DIAGNOSIS — R1030 Lower abdominal pain, unspecified: Secondary | ICD-10-CM | POA: Diagnosis not present

## 2019-02-14 DIAGNOSIS — R748 Abnormal levels of other serum enzymes: Secondary | ICD-10-CM | POA: Diagnosis not present

## 2019-02-14 DIAGNOSIS — R112 Nausea with vomiting, unspecified: Secondary | ICD-10-CM | POA: Diagnosis not present

## 2019-02-14 LAB — POCT URINALYSIS DIPSTICK
Glucose, UA: NEGATIVE
Ketones, UA: NEGATIVE
Nitrite, UA: POSITIVE
Protein, UA: POSITIVE — AB
Spec Grav, UA: 1.015 (ref 1.010–1.025)
Urobilinogen, UA: >=8 E.U./dL — AB
pH, UA: 5 (ref 5.0–8.0)

## 2019-02-14 MED ORDER — POLYETHYLENE GLYCOL 3350 17 GM/SCOOP PO POWD: 17.0000 g | Freq: Every day | ORAL | 1 refills | Status: AC

## 2019-02-14 MED ORDER — ONDANSETRON 8 MG PO TBDP: 8.0000 mg | ORAL_TABLET | Freq: Three times a day (TID) | ORAL | 1 refills | Status: AC | PRN

## 2019-02-14 NOTE — Progress Notes (Signed)
Provider: Harris Penton FNP-C  [name], NP  Patient Care Team: [name], NP as PCP - General (Geriatric Medicine)  Extended Emergency Contact Information Primary Emergency Contact: Bertram Savin Mobile Phone: [phone] Relation: Daughter Interpreter needed? No Secondary Emergency Contact: Junius Finner Address: [address]          [place], [place] [address] [place] Phone: [phone] Relation: Daughter  Code Status: Full Code  Goals of care: Advanced Directive information Advanced Directives 02/08/2019  Does Patient Have a Medical Advance Directive? Yes  Type of Advance Directive Living will  Does patient want to make changes to medical advance directive? -  Would patient like information on creating a medical advance directive? -  Pre-existing out of facility DNR order (yellow form or pink MOST form) -     Chief Complaint  Patient presents with   Follow-up    ER Follow up on nausea patient states pills are helping and getting some better, patient states she is having constipation and has not had bm  since 9/15    HPI:  Pt is a 63 y.o. female seen today for an acute visit for  ED visit  follow up for nausea and vomiting and abdominal pain on and off x 1 month.CT scan was negative for any abdominal mass or obstruction.she was treated with IVF and antinausea medications.GI specialist Dr.Gupta was consulted recommended additional labs and outpatient follow up. Hgb 11.1,CR 1.08,AST 510,ALT 503,ALK phosp 176,Total Bilirubin 3.2,Bilirubin 1.5,lipasea was within normal range and acute hepatitis panel was negative.Immunoglobulins G was 2500  She was discharged home on Zofran.she  states pills are helping and getting some better.she complains of constipation and has not had bm  since 02/08/19.she has not been eating well due to her nausea.she has tolerated soup but has not tried solid food.patient to advance diet as tolerated.she does  not understand why her bowels have moved though she has had poor oral intake with er nausea.she would like medication to help move her bowels.  She complains of bilateral lower abdomen pain and to the lower back. She denies any urine frequency,urgency or dysuria.she had a urine analysis in ED which showed amber colored hazy urine with moderate leukocytes and rare bacteria.Nitirte was negative.  Past Medical History:  Diagnosis Date   Asthma    QVAR daily and Albuterol as needed   Bladder infection    taking Keflex daily    Chronic back pain    reason unknown   Diabetes mellitus    takes Metformin and Tradjenta daily   GERD (gastroesophageal reflux disease)    takes Omeprazole daily   Hard of hearing    Headache    daily.Takes Excedrine daily   History of blood transfusion    no abnormal reaction noted   Hyperlipidemia    takes Atorvastatin daily   Hypertension    takes Quinapril daily   Joint pain    Joint swelling    Nocturia    Osteoarthritis of left knee 02/23/2012   Seasonal allergies    takes Claritin and Singulair daily.Nasal spray as needed   Shortness of breath dyspnea    with exertion   Past Surgical History:  Procedure Laterality Date   ABDOMINAL HYSTERECTOMY     INJECTION KNEE  04/09/2012   Procedure: KNEE INJECTION;  Surgeon: Johnny Bridge, MD;  Location: [facility];  Service: Orthopedics;  Laterality: Left;   JOINT REPLACEMENT     acl  bil knees   KNEE ARTHROTOMY Left 08/28/2015   Procedure: LEFT KNEE ARTHROFIBROSIS EXCISION; pylectomy;  Surgeon: Meredith Pel, MD;  Location: [facility];  Service: Orthopedics;  Laterality: Left;   KNEE CLOSED REDUCTION  04/09/2012   Procedure: CLOSED MANIPULATION KNEE;  Surgeon: Johnny Bridge, MD;  Location: [facility];  Service: Orthopedics;  Laterality: Left;  Manipulation Knee with Anesthesia includes Application of Traction    TOTAL KNEE ARTHROPLASTY  02/23/2012    Procedure: TOTAL KNEE ARTHROPLASTY;  Surgeon: Johnny Bridge, MD;  Location: [facility];  Service: Orthopedics;  Laterality: Left;    No Known Allergies  Outpatient Encounter Medications as of 02/14/2019  Medication Sig   Albuterol Sulfate (PROAIR RESPICLICK) 786 (90 Base) MCG/ACT AEPB Inhale 4 puffs into the lungs every 4 (four) hours as needed.   azelastine (ASTELIN) 0.1 % nasal spray USE 1 SPRAY EACH NOSTRIL TWICE DAILY   baclofen (LIORESAL) 10 MG tablet TAKE 1 TABLET BY MOUTH THREE TIMES A DAY AS NEEDED FOR MUSCLE SPASM   benzonatate (TESSALON PERLES) 100 MG capsule Take 1 capsule (100 mg total) by mouth 3 (three) times daily as needed for cough.   diclofenac sodium (VOLTAREN) 1 % GEL Apply 8 g topically 2 (two) times daily as needed. On knees   ferrous sulfate 325 (65 FE) MG tablet TAKE 1 TABLET BY MOUTH EVERY DAY WITH BREAKFAST   fluticasone (FLONASE) 50 MCG/ACT nasal spray Place 2 sprays into both nostrils daily.   fluticasone furoate-vilanterol (BREO ELLIPTA) 200-25 MCG/INH AEPB Inhale 1 puff into the lungs daily.   ipratropium (ATROVENT) 0.03 % nasal spray Place 2 sprays into both nostrils 2 (two) times daily.   metFORMIN (GLUCOPHAGE) 500 MG tablet TAKE ONE TABLET BY MOUTH TWICE DAILY TO CONTROL BLOOD SUGAR   montelukast (SINGULAIR) 10 MG tablet TAKE 1 TABLET BY MOUTH EVERYDAY AT BEDTIME   omeprazole (PRILOSEC) 40 MG capsule TAKE 1 CAPSULE BY MOUTH EVERY DAY   ondansetron (ZOFRAN ODT) 8 MG disintegrating tablet Take 1 tablet (8 mg total) by mouth every 8 (eight) hours as needed for nausea or vomiting.   ONE TOUCH ULTRA TEST test strip Check blood sugar once daily as directed   quinapril (ACCUPRIL) 20 MG tablet Take 1 tablet (20 mg total) by mouth at bedtime.   tiZANidine (ZANAFLEX) 4 MG tablet Take 1 tablet (4 mg total) by mouth every 6 (six) hours as needed for muscle spasms. For headaches.   topiramate (TOPAMAX) 50 MG tablet Start with one pill(37m) at bedtime and  increase to two pills(1047m at bedtime in one week.   [DISCONTINUED] aspirin-acetaminophen-caffeine (EXCEDRIN MIGRAINE) 250-250-65 MG per tablet Take 2 tablets by mouth daily.   [DISCONTINUED] atorvastatin (LIPITOR) 40 MG tablet TAKE 1 TABLET BY MOUTH EVERY DAY   [DISCONTINUED] predniSONE (DELTASONE) 10 MG tablet Take two tablets (2056mtwice daily for three days, then one tablet (26m64mwice daily for three days, then STOP.   [DISCONTINUED] TRADJENTA 5 MG TABS tablet TAKE 1 TABLET BY MOUTH EVERY DAY   No facility-administered encounter medications on file as of 02/14/2019.     Review of Systems  Constitutional: Negative for appetite change, chills, fatigue and fever.  HENT: Negative for congestion, rhinorrhea, sinus pressure, sinus pain, sneezing and sore throat.   Eyes: Negative for discharge, redness and itching.  Respiratory: Negative for cough, chest tightness, shortness of breath and wheezing.   Cardiovascular: Negative for chest pain, palpitations and leg swelling.  Gastrointestinal: Positive for constipation and nausea.  Negative for abdominal distention, abdominal pain, diarrhea and vomiting.  Endocrine: Negative for cold intolerance, heat intolerance, polydipsia, polyphagia and polyuria.  Genitourinary: Negative for decreased urine volume, difficulty urinating, dysuria, flank pain, frequency and urgency.  Musculoskeletal: Positive for arthralgias and back pain.  Skin: Negative for color change, pallor and rash.  Neurological: Positive for headaches. Negative for dizziness and light-headedness.       Chronic headaches  Psychiatric/Behavioral: Negative for agitation, confusion and sleep disturbance. The patient is not nervous/anxious.     Immunization History  Administered Date(s) Administered   Fluad Quad(high Dose 65+) 01/24/2019   Influenza Split 02/24/2012   Influenza,inj,Quad PF,6+ Mos 04/13/2015, 02/22/2016, 01/28/2017, 02/11/2018   Pneumococcal Polysaccharide-23  07/31/2016   Pertinent  Health Maintenance Due  Topic Date Due   PAP SMEAR-Modifier  10/05/2017   FOOT EXAM  02/04/2018   OPHTHALMOLOGY EXAM  03/27/2019 (Originally 06/04/2018)   HEMOGLOBIN A1C  07/24/2019   MAMMOGRAM  12/27/2020   COLONOSCOPY  02/03/2026   INFLUENZA VACCINE  Completed   Fall Risk  02/14/2019 01/24/2019 09/23/2018 02/23/2018 02/11/2018  Falls in the past year? 0 0 1 No No  Number falls in past yr: 0 0 0 - -  Injury with Fall? 0 0 0 - -     Vitals:   02/14/19 0949  BP: (!) 144/78  Pulse: 83  Temp: (!) 97.1 F (36.2 C)  TempSrc: Oral  SpO2: 97%  Weight: 192 lb 3.2 oz (87.2 kg)  Height: _0  (1.6 m)   Body mass index is 34.05 kg/m. Physical Exam Vitals signs reviewed.  Constitutional:      General: She is not in acute distress.    Appearance: She is obese. She is not ill-appearing.  HENT:     Mouth/Throat:     Mouth: Mucous membranes are moist.     Pharynx: Oropharynx is clear. No oropharyngeal exudate or posterior oropharyngeal erythema.  Eyes:     General: No scleral icterus.       Right eye: No discharge.        Left eye: No discharge.     Extraocular Movements: Extraocular movements intact.     Conjunctiva/sclera: Conjunctivae normal.     Pupils: Pupils are equal, round, and reactive to light.  Cardiovascular:     Rate and Rhythm: Normal rate and regular rhythm.     Pulses: Normal pulses.     Heart sounds: Normal heart sounds. No murmur. No friction rub. No gallop.   Pulmonary:     Effort: Pulmonary effort is normal. No respiratory distress.     Breath sounds: Normal breath sounds. No wheezing, rhonchi or rales.  Chest:     Chest wall: No tenderness.  Abdominal:     General: Bowel sounds are normal. There is no distension.     Palpations: Abdomen is soft. There is no mass.     Tenderness: There is no right CVA tenderness, left CVA tenderness, guarding or rebound. Negative signs include Murphy's sign.     Comments: Bilateral lower  and  right upper Quadrant tender to palpation.  Musculoskeletal:        General: No swelling or tenderness.     Right lower leg: No edema.     Left lower leg: No edema.     Comments: Unsteady gait ambulates with right hand four point cane.   Skin:    General: Skin is dry.     Coloration: Skin is not pale.     Findings: No  bruising or erythema.  Neurological:     Mental Status: She is alert and oriented to person, place, and time.     Cranial Nerves: No cranial nerve deficit.     Sensory: No sensory deficit.     Motor: No weakness.     Coordination: Coordination normal.     Gait: Gait abnormal.  Psychiatric:        Mood and Affect: Mood normal.        Behavior: Behavior normal.        Thought Content: Thought content normal.        Cognition and Memory: She exhibits impaired recent memory.        Judgment: Judgment normal.     Labs reviewed: Recent Labs    01/24/19 1121 02/08/19 1752 02/11/19 1009  NA 137 143 141  K 3.8 3.5 3.8  CL 103 110 108  CO2 _0 GLUCOSE 175* 123* 128*  BUN _1 CREATININE 1.28* 1.08* 1.12*  CALCIUM 9.8 9.8 9.6   Recent Labs    01/28/19 0932 02/08/19 1752 02/11/19 1009  AST 583* 510* 310*  ALT 728* 503* 326*  ALKPHOS  --  176*  --   BILITOT 1.1 3.2* 2.1*  PROT 7.3 8.8* 7.3  ALBUMIN  --  3.8  --    Recent Labs    02/23/18 1109  01/24/19 1121 01/28/19 0932 02/08/19 1752  WBC 6.3   < > 7.6 9.7 9.9  NEUTROABS 3,868  --  3,823 4,093  --   HGB 10.1*   < > 9.9* 9.6* 11.1*  HCT 30.7*   < > 29.9* 30.3* 34.4*  MCV 79.1*   < > 77.9* 81.5 79.3*  PLT 276   < > 308 415* 314   < > = values in this interval not displayed.   Lab Results  Component Value Date   TSH 0.517 12/13/2018   Lab Results  Component Value Date   HGBA1C 6.9 (H) 01/24/2019   Lab Results  Component Value Date   CHOL 110 01/24/2019   HDL 13 (L) 01/24/2019   LDLCALC 72 01/24/2019   TRIG 168 (H) 01/24/2019   CHOLHDL 8.5 (H) 01/24/2019    Significant  Diagnostic Results in last 30 days:  US Abdomen Complete  Result Date: 02/04/2019 CLINICAL DATA:  Elevated liver enzymes. Abdominal pain and nausea for past 1-2 weeks. EXAM: ABDOMEN ULTRASOUND COMPLETE COMPARISON:  None. FINDINGS: Gallbladder: No gallstones or wall thickening visualized. No sonographic Murphy sign noted by sonographer. Common bile duct: Diameter: 2 mm, within normal limits. Liver: No focal lesion identified. Within normal limits in parenchymal echogenicity. Portal vein is patent on color Doppler imaging with normal direction of blood flow towards the liver. IVC: No abnormality visualized. Pancreas: Visualized portion unremarkable. Spleen: Size and appearance within normal limits. Right Kidney: Length: 10.6 cm. Echogenicity within normal limits. No mass or hydronephrosis visualized. Left Kidney: Length: 10.6 cm. Echogenicity within normal limits. No mass or hydronephrosis visualized. Abdominal aorta: No aneurysm visualized. Other findings: None. IMPRESSION: Normal study. No hepatobiliary or other significant abnormality identified. Electronically Signed   By: Marlaine Hind M.D.   On: 02/04/2019 09:43   Ct Abdomen Pelvis W Contrast  Result Date: 02/08/2019 CLINICAL DATA:  63 year old female with acute abdominal pain with nausea, vomiting and diarrhea. EXAM: CT ABDOMEN AND PELVIS WITH CONTRAST TECHNIQUE: Multidetector CT imaging of the abdomen and pelvis was performed using the standard protocol following bolus administration of intravenous contrast.  CONTRAST:  139m OMNIPAQUE IOHEXOL 300 MG/ML  SOLN COMPARISON:  07/30/2014 CT FINDINGS: Lower chest: No acute abnormality. Mild bibasilar scarring again noted. Hepatobiliary: The liver and gallbladder are unremarkable. No biliary dilatation. Pancreas: Unremarkable Spleen: Unremarkable Adrenals/Urinary Tract: The kidneys, adrenal glands and bladder are unremarkable. Stomach/Bowel: Stomach is within normal limits. Appendix appears normal. No evidence  of bowel wall thickening, distention, or inflammatory changes. Vascular/Lymphatic: Aortic atherosclerosis. No enlarged abdominal or pelvic lymph nodes. Reproductive: Status post hysterectomy. No adnexal masses. Other: No ascites, focal collection or pneumoperitoneum. Musculoskeletal: No acute or suspicious bony abnormalities noted. Mild degenerative disc disease/spondylosis in the LOWER lumbar spine noted. IMPRESSION: 1. No evidence of acute abnormality. No CT findings to suggest a cause for this patient's abdominal pain. 2.  Aortic Atherosclerosis (ICD10-I70.0). Electronically Signed   By: JMargarette CanadaM.D.   On: 02/08/2019 19:54    Assessment/Plan 1. Nausea and vomiting, intractability of vomiting not specified, unspecified vomiting type Status post Ed visit for N/V Zofran ordered with much relief.she has had no vomiting since starting on Zofran.she would like refill.will refill Zofran 8 mg ODT tablet one by mouth every 8 Hrs as needed.  2. Slow transit constipation No BM since 02/08/2019 though her poor oral intake could be a contributory factor.encouraged to increase her fluid intake.advance diet as tolerated.add miralax.  - polyethylene glycol powder (GLYCOLAX/MIRALAX) 17 GM/SCOOP powder; Take 17 g by mouth daily. Hold for loose stool  Dispense: 3350 g; Refill: 1  3. Elevated liver enzymes Has upcoming appointment with Dr.Gupta 02/24/2019.   4. Lower abdominal pain - POC Urinalysis Dipstick cloudy orange urine,positive for protein and blood,moderate leukocytes,Nitrite positive.  - Culture, Urine  Family/ staff Communication: Reviewed plan of care with patient and Daughter.  Labs/tests ordered: U/A and C/S rule out UTI   DSandrea Hughs NP

## 2019-02-17 LAB — URINE CULTURE
MICRO NUMBER:: 905017
SPECIMEN QUALITY:: ADEQUATE

## 2019-02-18 ENCOUNTER — Other Ambulatory Visit: Payer: Self-pay

## 2019-02-18 ENCOUNTER — Telehealth: Payer: Self-pay

## 2019-02-18 MED ORDER — SACCHAROMYCES BOULARDII 250 MG PO CAPS: 250.0000 mg | ORAL_CAPSULE | Freq: Two times a day (BID) | ORAL | 0 refills | Status: DC

## 2019-02-18 MED ORDER — CIPROFLOXACIN HCL 500 MG PO TABS: 500.0000 mg | ORAL_TABLET | Freq: Two times a day (BID) | ORAL | 0 refills | Status: DC

## 2019-02-18 NOTE — Telephone Encounter (Signed)
There was a possible interaction with the Cipro and tizanidine. Medication request pended and forwarded to [facility] for review/approval.

## 2019-02-18 NOTE — Telephone Encounter (Signed)
Ms. Tibbetts called with the results of her urine culture.  She was informed about the medications to take and how to take them.  Told her that medication order would be sent to her pharmacy.   Cipro 500 mg - 1 tab BID x7 days, Florastor 250 mg - 1 tab BID x10 days sent to [facility] on [address].

## 2019-02-18 NOTE — Telephone Encounter (Signed)
-----   Message from Sandrea Hughs, NP sent at 02/18/2019 10:02 AM EDT ----- Urine culture indicates > 100,000 colonies of E.Coli start on Cipro 500 mg tablet one by mouth twice daily x 7 days.Take along with OTC probiotic or  Florastor 250 mg capsule one by mouth twice daily x 10 days for anabiotic associated diarrhea prevention.Increase water intake.

## 2019-02-21 ENCOUNTER — Other Ambulatory Visit: Payer: Self-pay | Admitting: Allergy & Immunology

## 2019-02-23 ENCOUNTER — Ambulatory Visit (INDEPENDENT_AMBULATORY_CARE_PROVIDER_SITE_OTHER): Payer: Medicare Other | Admitting: Nurse Practitioner

## 2019-02-23 ENCOUNTER — Encounter: Payer: Self-pay | Admitting: Nurse Practitioner

## 2019-02-23 ENCOUNTER — Other Ambulatory Visit: Payer: Self-pay

## 2019-02-23 VITALS — BP 158/80 | HR 80 | Temp 96.9°F | Ht 63.0 in | Wt 191.4 lb

## 2019-02-23 DIAGNOSIS — Z0000 Encounter for general adult medical examination without abnormal findings: Secondary | ICD-10-CM

## 2019-02-23 DIAGNOSIS — D649 Anemia, unspecified: Secondary | ICD-10-CM

## 2019-02-23 DIAGNOSIS — R112 Nausea with vomiting, unspecified: Secondary | ICD-10-CM

## 2019-02-23 DIAGNOSIS — G43719 Chronic migraine without aura, intractable, without status migrainosus: Secondary | ICD-10-CM | POA: Diagnosis not present

## 2019-02-23 DIAGNOSIS — E119 Type 2 diabetes mellitus without complications: Secondary | ICD-10-CM

## 2019-02-23 DIAGNOSIS — I447 Left bundle-branch block, unspecified: Secondary | ICD-10-CM

## 2019-02-23 DIAGNOSIS — J4540 Moderate persistent asthma, uncomplicated: Secondary | ICD-10-CM

## 2019-02-23 DIAGNOSIS — R748 Abnormal levels of other serum enzymes: Secondary | ICD-10-CM

## 2019-02-23 DIAGNOSIS — E785 Hyperlipidemia, unspecified: Secondary | ICD-10-CM

## 2019-02-23 DIAGNOSIS — M1712 Unilateral primary osteoarthritis, left knee: Secondary | ICD-10-CM

## 2019-02-23 DIAGNOSIS — I10 Essential (primary) hypertension: Secondary | ICD-10-CM | POA: Diagnosis not present

## 2019-02-23 MED ORDER — QUINAPRIL HCL 40 MG PO TABS: 40.0000 mg | ORAL_TABLET | Freq: Every day | ORAL | 1 refills | Status: DC

## 2019-02-23 NOTE — Patient Instructions (Signed)
[name] , Thank you for taking time to come for your Medicare Wellness Visit. I appreciate your ongoing commitment to your health goals. Please review the following plan we discussed and let me know if I can assist you in the future.   Screening recommendations/referrals: Colonoscopy up to date Mammogram up to date Recommended yearly ophthalmology/optometry visit for glaucoma screening and checkup Recommended yearly dental visit for hygiene and checkup  Vaccinations: Influenza vaccine up to date Pneumococcal vaccine up to date Tdap vaccine NEED Shingles vaccine need to get at local pharmacy- please request this.   Advanced directives: please completed and bring back to office once notarized   Conditions/risks identified: malnutrition due to weight loss.   Next appointment: 1 year  Preventive Care 40-64 Years, Female Preventive care refers to lifestyle choices and visits with your health care provider that can promote health and wellness. What does preventive care include?  A yearly physical exam. This is also called an annual well check.  Dental exams once or twice a year.  Routine eye exams. Ask your health care provider how often you should have your eyes checked.  Personal lifestyle choices, including:  Daily care of your teeth and gums.  Regular physical activity.  Eating a healthy diet.  Avoiding tobacco and drug use.  Limiting alcohol use.  Practicing safe sex.  Taking low-dose aspirin daily starting at age 13.  Taking vitamin and mineral supplements as recommended by your health care provider. What happens during an annual well check? The services and screenings done by your health care provider during your annual well check will depend on your age, overall health, lifestyle risk factors, and family history of disease. Counseling  Your health care provider may ask you questions about your:  Alcohol use.  Tobacco use.  Drug use.  Emotional  well-being.  Home and relationship well-being.  Sexual activity.  Eating habits.  Work and work Statistician.  Method of birth control.  Menstrual cycle.  Pregnancy history. Screening  You may have the following tests or measurements:  Height, weight, and BMI.  Blood pressure.  Lipid and cholesterol levels. These may be checked every 5 years, or more frequently if you are over 59 years old.  Skin check.  Lung cancer screening. You may have this screening every year starting at age 58 if you have a 30-pack-year history of smoking and currently smoke or have quit within the past 15 years.  Fecal occult blood test (FOBT) of the stool. You may have this test every year starting at age 6.  Flexible sigmoidoscopy or colonoscopy. You may have a sigmoidoscopy every 5 years or a colonoscopy every 10 years starting at age 46.  Hepatitis C blood test.  Hepatitis B blood test.  Sexually transmitted disease (STD) testing.  Diabetes screening. This is done by checking your blood sugar (glucose) after you have not eaten for a while (fasting). You may have this done every 1-3 years.  Mammogram. This may be done every 1-2 years. Talk to your health care provider about when you should start having regular mammograms. This may depend on whether you have a family history of breast cancer.  BRCA-related cancer screening. This may be done if you have a family history of breast, ovarian, tubal, or peritoneal cancers.  Pelvic exam and Pap test. This may be done every 3 years starting at age 21. Starting at age 21, this may be done every 5 years if you have a Pap test in combination with  an HPV test.  Bone density scan. This is done to screen for osteoporosis. You may have this scan if you are at high risk for osteoporosis. Discuss your test results, treatment options, and if necessary, the need for more tests with your health care provider. Vaccines  Your health care provider may recommend  certain vaccines, such as:  Influenza vaccine. This is recommended every year.  Tetanus, diphtheria, and acellular pertussis (Tdap, Td) vaccine. You may need a Td booster every 10 years.  Zoster vaccine. You may need this after age 2.  Pneumococcal 13-valent conjugate (PCV13) vaccine. You may need this if you have certain conditions and were not previously vaccinated.  Pneumococcal polysaccharide (PPSV23) vaccine. You may need one or two doses if you smoke cigarettes or if you have certain conditions. Talk to your health care provider about which screenings and vaccines you need and how often you need them. This information is not intended to replace advice given to you by your health care provider. Make sure you discuss any questions you have with your health care provider. Document Released: 06/08/2015 Document Revised: 01/30/2016 Document Reviewed: 03/13/2015 Elsevier Interactive Patient Education  2017 [place] Prevention in the Home Falls can cause injuries. They can happen to people of all ages. There are many things you can do to make your home safe and to help prevent falls. What can I do on the outside of my home?  Regularly fix the edges of walkways and driveways and fix any cracks.  Remove anything that might make you trip as you walk through a door, such as a raised step or threshold.  Trim any bushes or trees on the path to your home.  Use bright outdoor lighting.  Clear any walking paths of anything that might make someone trip, such as rocks or tools.  Regularly check to see if handrails are loose or broken. Make sure that both sides of any steps have handrails.  Any raised decks and porches should have guardrails on the edges.  Have any leaves, snow, or ice cleared regularly.  Use sand or salt on walking paths during winter.  Clean up any spills in your garage right away. This includes oil or grease spills. What can I do in the bathroom?  Use  night lights.  Install grab bars by the toilet and in the tub and shower. Do not use towel bars as grab bars.  Use non-skid mats or decals in the tub or shower.  If you need to sit down in the shower, use a plastic, non-slip stool.  Keep the floor dry. Clean up any water that spills on the floor as soon as it happens.  Remove soap buildup in the tub or shower regularly.  Attach bath mats securely with double-sided non-slip rug tape.  Do not have throw rugs and other things on the floor that can make you trip. What can I do in the bedroom?  Use night lights.  Make sure that you have a light by your bed that is easy to reach.  Do not use any sheets or blankets that are too big for your bed. They should not hang down onto the floor.  Have a firm chair that has side arms. You can use this for support while you get dressed.  Do not have throw rugs and other things on the floor that can make you trip. What can I do in the kitchen?  Clean up any spills right away.  Avoid walking on wet floors.  Keep items that you use a lot in easy-to-reach places.  If you need to reach something above you, use a strong step stool that has a grab bar.  Keep electrical cords out of the way.  Do not use floor polish or wax that makes floors slippery. If you must use wax, use non-skid floor wax.  Do not have throw rugs and other things on the floor that can make you trip. What can I do with my stairs?  Do not leave any items on the stairs.  Make sure that there are handrails on both sides of the stairs and use them. Fix handrails that are broken or loose. Make sure that handrails are as long as the stairways.  Check any carpeting to make sure that it is firmly attached to the stairs. Fix any carpet that is loose or worn.  Avoid having throw rugs at the top or bottom of the stairs. If you do have throw rugs, attach them to the floor with carpet tape.  Make sure that you have a light switch at  the top of the stairs and the bottom of the stairs. If you do not have them, ask someone to add them for you. What else can I do to help prevent falls?  Wear shoes that:  Do not have high heels.  Have rubber bottoms.  Are comfortable and fit you well.  Are closed at the toe. Do not wear sandals.  If you use a stepladder:  Make sure that it is fully opened. Do not climb a closed stepladder.  Make sure that both sides of the stepladder are locked into place.  Ask someone to hold it for you, if possible.  Clearly mark and make sure that you can see:  Any grab bars or handrails.  First and last steps.  Where the edge of each step is.  Use tools that help you move around (mobility aids) if they are needed. These include:  Canes.  Walkers.  Scooters.  Crutches.  Turn on the lights when you go into a dark area. Replace any light bulbs as soon as they burn out.  Set up your furniture so you have a clear path. Avoid moving your furniture around.  If any of your floors are uneven, fix them.  If there are any pets around you, be aware of where they are.  Review your medicines with your doctor. Some medicines can make you feel dizzy. This can increase your chance of falling. Ask your doctor what other things that you can do to help prevent falls. This information is not intended to replace advice given to you by your health care provider. Make sure you discuss any questions you have with your health care provider. Document Released: 03/08/2009 Document Revised: 10/18/2015 Document Reviewed: 06/16/2014 Elsevier Interactive Patient Education  2017 [employer].

## 2019-02-23 NOTE — Patient Instructions (Addendum)
TO INCREASE DIET AS TOLERATED  Try to maintain low sodium diet.  Referral placed to cardiologist at this time. If you experience chest pain to go to the hospital immediately  Please make follow up with ophthalmologist for eye exam  Angina  Angina is very bad discomfort or pain in the chest, neck, arm, jaw, or back. The discomfort is caused by a lack of blood in the middle layer of the heart wall (myocardium). What are the causes? This condition is caused by a buildup of fat and cholesterol (plaque) in your arteries (atherosclerosis). This buildup narrows the arteries and makes it hard for blood to flow. What increases the risk? You are more likely to develop this condition if:  You have high levels of cholesterol in your blood.  You have high blood pressure (hypertension).  You have diabetes.  You have a family history of heart disease.  You are not active, or you do not exercise enough.  You feel sad (depressed).  You have been treated with high energy rays (radiation) on the left side of your chest. Other risk factors are:  Using tobacco.  Being very overweight (obese).  Eating a diet high in unhealthy fats (saturated fats).  Having stress, or being exposed to things that cause stress.  Using drugs, such as cocaine. Women have a greater risk for angina if:  They are older than 96.  They have stopped having their period (are in postmenopause). What are the signs or symptoms? Common symptoms of this condition in both men and women may include:  Chest pain, which may: ? Feel like a crushing or squeezing in the chest. ? Feel like a tightness, pressure, fullness, or heaviness in the chest. ? Last for more than a few minutes at a time. ? Stop and come back (recur) after a few minutes.  Pain in the neck, arm, jaw, or back.  Heartburn or upset stomach (indigestion) for no reason.  Being short of breath.  Feeling sick to your stomach (nauseous).  Sudden cold  sweats. Women and people with diabetes may have other symptoms that are not usual, such as feeling:  Tired (fatigue).  Worried or nervous (anxious) for no reason.  Weak for no reason.  Dizzy or passing out (fainting). How is this treated? This condition may be treated with:  Medicines. These are given to: ? Prevent blood clots. ? Prevent heart attack. ? Relax blood vessels and improve blood flow to the heart (nitrates). ? Reduce blood pressure. ? Improve the pumping action of the heart. ? Reduce fat and cholesterol in the blood.  A procedure to widen a narrowed or blocked artery in the heart (angioplasty).  Surgery to allow blood to go around a blocked artery (coronary artery bypass surgery). Follow these instructions at home: Medicines  Take over-the-counter and prescription medicines only as told by your doctor.  Do not take these medicines unless your doctor says that you can: ? NSAIDs. These include:  Ibuprofen.  Naproxen. ? Vitamin supplements that have vitamin A, vitamin E, or both. ? Hormone therapy that contains estrogen with or without progestin. Eating and drinking   Eat a heart-healthy diet that includes: ? Lots of fresh fruits and vegetables. ? Whole grains. ? Low-fat (lean) protein. ? Low-fat dairy products.  Follow instructions from your doctor about what you cannot eat or drink. Activity  Follow an exercise program that your doctor tells you.  Talk with your doctor about joining a program to help improve the health  of your heart (cardiac rehab).  When you feel tired, take a break. Plan breaks if you know you are going to feel tired. Lifestyle   Do not use any products that contain nicotine or tobacco. This includes cigarettes, e-cigarettes, and chewing tobacco. If you need help quitting, ask your doctor.  If your doctor says you can drink alcohol: ? Limit how much you use to:  0-1 drink a day for women who are not pregnant.  0-2 drinks a  day for men. ? Be aware of how much alcohol is in your drink. In the [place] one drink equals:  One 12 oz bottle of beer (355 mL).  One 5 oz glass of wine (148 mL).  One 1 oz glass of hard liquor (44 mL). General instructions  Stay at a healthy weight. If your doctor tells you to do so, work with him or her to lose weight.  Learn to deal with stress. If you need help, ask your doctor.  Keep your vaccines up to date. Get a flu shot every year.  Talk with your doctor if you feel sad. Take a screening test to see if you are at risk for depression.  Work with your doctor to manage any other health problems that you have. These may include diabetes or high blood pressure.  Keep all follow-up visits as told by your doctor. This is important. Get help right away if:  You have pain in your chest, neck, arm, jaw, or back, and the pain: ? Lasts more than a few minutes. ? Comes back. ? Does not get better after you take medicine under your tongue (sublingual nitroglycerin). ? Keeps getting worse. ? Comes more often.  You have any of these problems for no reason: ? Sweating a lot. ? Heartburn or upset stomach. ? Shortness of breath. ? Trouble breathing. ? Feeling sick to your stomach. ? Throwing up (vomiting). ? Feeling more tired than normal. ? Feeling nervous or worrying more than normal. ? Weakness.  You are suddenly dizzy or light-headed.  You pass out. These symptoms may be an emergency. Do not wait to see if the symptoms will go away. Get medical help right away. Call your local emergency services (911 in the [place] Do not drive yourself to the hospital. Summary  Angina is very bad discomfort or pain in the chest, neck, arm, neck, or back.  Symptoms include chest pain, heartburn or upset stomach for no reason, and shortness of breath.  Women or people with diabetes may have symptoms that are not usual, such as feeling nervous or worried for no reason, weak for no reason,  or tired.  Take all medicines only as told by your doctor.  You should eat a heart-healthy diet and follow an exercise program. This information is not intended to replace advice given to you by your health care provider. Make sure you discuss any questions you have with your health care provider. Document Released: 10/29/2007 Document Revised: 12/28/2017 Document Reviewed: 12/28/2017 Elsevier Patient Education  2020 [place] These Tests  Blood Pressure- Have your blood pressure checked by your healthcare provider at least once a year.  Normal blood pressure is 120/80. Goal for most <140/90.  Weight- Have your body mass index (BMI) calculated to screen for obesity.  BMI is a measure of body fat based on height and weight.  You can calculate your own BMI at [employer]  Cholesterol- Have your cholesterol checked every year.  Diabetes- Have  your blood sugar checked every year if you have high blood pressure, high cholesterol, a family history of diabetes or if you are overweight.  Pap Test - Have a pap test every 1 to 5 years if you have been sexually active.  If you are older than 65 and recent pap tests have been normal you may not need additional pap tests.  In addition, if you have had a hysterectomy  for benign disease additional pap tests are not necessary.  Mammogram-Yearly mammograms are essential for early detection of breast cancer  Screening for Colon Cancer- Colonoscopy starting at age 69. Screening may begin sooner depending on your family history and other health conditions.  Follow up colonoscopy as directed by your Gastroenterologist.  Screening for Osteoporosis- Screening begins at age 53 with bone density scanning, sooner if you are at higher risk for developing Osteoporosis.   Get these medicines  Calcium with Vitamin D- Your body requires 1200-1500 mg of Calcium a day and [phone] IU of Vitamin D a day.  You can only absorb 500 mg of Calcium  at a time therefore Calcium must be taken in 2 or 3 separate doses throughout the day.  Hormones- Hormone therapy has been associated with increased risk for certain cancers and heart disease.  Talk to your healthcare provider about if you need relief from menopausal symptoms.  Aspirin- Ask your healthcare provider about taking Aspirin to prevent Heart Disease and Stroke.   Get these Immuniztions  Flu shot- Every fall  Pneumonia shot- Once after the age of 105; if you are younger ask your healthcare provider if you need a pneumonia shot.  Tetanus- Every ten years.  Shingrix- Once after the age of 76 to prevent shingles.   Take these steps  Don't smoke- Your healthcare provider can help you quit. For tips on how to quit, ask your healthcare provider or go to www.smokefree.gov or call 1-800 QUIT-NOW.  Be physically active- Exercise 5 days a week for a minimum of 30 minutes.  If you are not already physically active, start slow and gradually work up to 30 minutes of moderate physical activity.  Try walking, dancing, bike riding, swimming, etc.  Eat a healthy diet- Eat a variety of healthy foods such as fruits, vegetables, whole grains, low fat milk, low fat cheeses, yogurt, lean meats, chicken, fish, eggs, dried beans, tofu, etc.  For more information go to www.thenutritionsource.org  Dental visit- Brush and floss teeth twice daily; visit your dentist twice a year.  Eye exam- Visit your Optometrist or Ophthalmologist yearly.  Drink alcohol in moderation- Limit alcohol intake to one drink or less a day.  Never drink and drive.  Depression- Your emotional health is as important as your physical health.  If you're feeling down or losing interest in things you normally enjoy, please talk to your healthcare provider.  Seat Belts- can save your life; always wear one  Smoke/Carbon Monoxide detectors- These detectors need to be installed on the appropriate level of your home.  Replace batteries  at least once a year.  Violence- If anyone is threatening or hurting you, please tell your healthcare provider.  Living Will/ Health care power of attorney- Discuss with your healthcare provider and family.

## 2019-02-23 NOTE — Progress Notes (Signed)
Provider: [name], NP  Patient Care Team: [name], NP as PCP - General (Geriatric Medicine)  Extended Emergency Contact Information Primary Emergency Contact: Bertram Savin Home Phone: [phone] Mobile Phone: [phone] Relation: Daughter Interpreter needed? No Secondary Emergency Contact: Junius Finner Address: [address]          [place], [place] [address] [place] Phone: [phone] Relation: Daughter No Known Allergies Code Status: FULL Goals of Care: Advanced Directive information Advanced Directives 02/23/2019  Does Patient Have a Medical Advance Directive? No  Type of Advance Directive -  Does patient want to make changes to medical advance directive? -  Would patient like information on creating a medical advance directive? Yes (MAU/Ambulatory/Procedural Areas - Information given)  Pre-existing out of facility DNR order (yellow form or pink MOST form) -     Chief Complaint  Patient presents with   Annual Exam    Yearly physical, no pap.    Best Practice Recommendations    Discuss need for HIV screening    Quality Metric Gaps    Pap and foot exam due     HPI: Patient is a 63 y.o. female seen in today for an annual wellness exam.    Diet? Limited due to nausea.   Exercise?none.    Dentition: does not go routinely  Ophthalmology appt: does not see eye doctor routinely  routine specialist: plans to see GI tomorrow ongoing nausea and abdominal pain, pulmonary for asthma.   htn- blood pressure elevated, states that her blood pressure medication used to be higher and since it got cut back down blood pressure has not been controlled.  Taking quinapril 20 mg daily  Migraines- controlled on topamax and uses zanaflex PRN. Sees Dr Jaynee Eagles.   DM- uses metformin 500 mg BID. A1c 6.9, no hypoglycemia reported  Anemia- continues on iron daily   Depression screen [facility] 2/9 02/23/2019 02/23/2019 09/23/2018 02/11/2018  01/28/2017  Decreased Interest 0 0 0 0 0  Down, Depressed, Hopeless 0 0 0 0 0  PHQ - 2 Score 0 0 0 0 0    Fall Risk  02/23/2019 02/23/2019 02/14/2019 01/24/2019 09/23/2018  Falls in the past year? 0 0 0 0 1  Number falls in past yr: 0 0 0 0 0  Injury with Fall? 0 0 0 0 0   No flowsheet data found.   Health Maintenance  Topic Date Due   HIV Screening  12/17/1970   FOOT EXAM  02/04/2018   TETANUS/TDAP  02/24/2019 (Originally 12/17/1973)   OPHTHALMOLOGY EXAM  03/27/2019 (Originally 06/04/2018)   HEMOGLOBIN A1C  07/24/2019   MAMMOGRAM  12/27/2020   COLONOSCOPY  02/03/2026   INFLUENZA VACCINE  Completed   Hepatitis C Screening  Completed   PAP SMEAR-Modifier  Discontinued    Past Medical History:  Diagnosis Date   Asthma    QVAR daily and Albuterol as needed   Bladder infection    taking Keflex daily    Chronic back pain    reason unknown   Diabetes mellitus    takes Metformin and Tradjenta daily   GERD (gastroesophageal reflux disease)    takes Omeprazole daily   Hard of hearing    Headache    daily.Takes Excedrine daily   History of blood transfusion    no abnormal reaction noted   Hyperlipidemia    takes Atorvastatin daily   Hypertension    takes Quinapril daily   Joint pain    Joint swelling  Nocturia    Osteoarthritis of left knee 02/23/2012   Seasonal allergies    takes Claritin and Singulair daily.Nasal spray as needed   Shortness of breath dyspnea    with exertion    Past Surgical History:  Procedure Laterality Date   ABDOMINAL HYSTERECTOMY     INJECTION KNEE  04/09/2012   Procedure: KNEE INJECTION;  Surgeon: Johnny Bridge, MD;  Location: [facility];  Service: Orthopedics;  Laterality: Left;   JOINT REPLACEMENT     acl   bil knees   KNEE ARTHROTOMY Left 08/28/2015   Procedure: LEFT KNEE ARTHROFIBROSIS EXCISION; pylectomy;  Surgeon: Meredith Pel, MD;  Location: [facility];  Service: Orthopedics;  Laterality:  Left;   KNEE CLOSED REDUCTION  04/09/2012   Procedure: CLOSED MANIPULATION KNEE;  Surgeon: Johnny Bridge, MD;  Location: [facility];  Service: Orthopedics;  Laterality: Left;  Manipulation Knee with Anesthesia includes Application of Traction    TOTAL KNEE ARTHROPLASTY  02/23/2012   Procedure: TOTAL KNEE ARTHROPLASTY;  Surgeon: Johnny Bridge, MD;  Location: [facility];  Service: Orthopedics;  Laterality: Left;    Social History   Socioeconomic History   Marital status: Widowed    Spouse name: Not on file   Number of children: 4   Years of education: 14   Highest education level: Some college, no degree  Occupational History   Not on file  Social Needs   Financial resource strain: Somewhat hard   Food insecurity    Worry: Sometimes true    Inability: Sometimes true   Transportation needs    Medical: No    Non-medical: No  Tobacco Use   Smoking status: Never Smoker   Smokeless tobacco: Never Used  Substance and Sexual Activity   Alcohol use: No    Alcohol/week: 0.0 standard drinks   Drug use: No   Sexual activity: Yes    Birth control/protection: Surgical  Lifestyle   Physical activity    Days per week: 7 days    Minutes per session: 30 min   Stress: To some extent  Relationships   Social connections    Talks on phone: Twice a week    Gets together: Once a week    Attends religious service: More than 4 times per year    Active member of club or organization: No    Attends meetings of clubs or organizations: Never    Relationship status: Widowed  Other Topics Concern   Not on file  Social History Narrative   Diet: none   Do you drink/eat things with caffeine ? Coffee    Material status: widow     What year were you married? 08/01/1978   Do you live in a house, apartment, assisted living,condo, trailer,ect.)? Townhouse (temporary)    Is it one or more stories? Yes   How many persons live in your home? Two   Do you have any pets in your  home ? Yes, Shih-tzu   Current or past profession: none   Do you exercise? No  Type & how often: no   Do you have a living will ? No   Do you have a DNR form? No   If not, do you want to discuss one?  Not now   Do you have signed POA /HPOA forms? No    If so, please bring to your appointment.            Update 12/13/2018: pt states she  doesn't drink much coffee, tea   She lives alone                      Family History  Problem Relation Age of Onset   Bronchitis Father 34   Alzheimer's disease Mother    High blood pressure Sister    High blood pressure Sister    Headache Sister        pt thinks    High blood pressure Sister    High blood pressure Sister        pt thinks   High blood pressure Sister    High blood pressure Sister    Headache Daughter    Diabetes Daughter    Diabetes Daughter    Diabetes Other    Colon cancer Neg Hx    Esophageal cancer Neg Hx    Rectal cancer Neg Hx    Stomach cancer Neg Hx     Review of Systems:  Review of Systems  Constitutional: Negative for chills, fever and weight loss.  HENT: Positive for nosebleeds (had nose bleeds but has not had on since before she went to ED). Negative for tinnitus.   Respiratory: Negative for cough, sputum production and shortness of breath.   Cardiovascular: Negative for chest pain, palpitations and leg swelling.  Gastrointestinal: Positive for abdominal pain and nausea. Negative for constipation, diarrhea and heartburn.  Genitourinary: Negative for dysuria, frequency and urgency.       Currently being treated for UTI  Musculoskeletal: Positive for joint pain. Negative for back pain, falls and myalgias.  Skin: Negative.   Neurological: Positive for headaches (rarely). Negative for dizziness.  Psychiatric/Behavioral: Negative for depression and memory loss. The patient does not have insomnia.      Allergies as of 02/23/2019   No Known Allergies     Medication List       Accurate  as of February 23, 2019 11:11 AM. If you have any questions, ask your nurse or doctor.        azelastine 0.1 % nasal spray Commonly known as: ASTELIN USE 1 SPRAY EACH NOSTRIL TWICE DAILY   baclofen 10 MG tablet Commonly known as: LIORESAL TAKE 1 TABLET BY MOUTH THREE TIMES A DAY AS NEEDED FOR MUSCLE SPASM   benzonatate 100 MG capsule Commonly known as: Tessalon Perles Take 1 capsule (100 mg total) by mouth 3 (three) times daily as needed for cough.   Breo Ellipta 200-25 MCG/INH Aepb Generic drug: fluticasone furoate-vilanterol Inhale 1 puff into the lungs daily.   ciprofloxacin 500 MG tablet Commonly known as: CIPRO Take 1 tablet (500 mg total) by mouth 2 (two) times daily. x7 days   diclofenac sodium 1 % Gel Commonly known as: VOLTAREN Apply 8 g topically 2 (two) times daily as needed. On knees   ferrous sulfate 325 (65 FE) MG tablet TAKE 1 TABLET BY MOUTH EVERY DAY WITH BREAKFAST   fluticasone 50 MCG/ACT nasal spray Commonly known as: FLONASE Place 2 sprays into both nostrils daily.   ipratropium 0.03 % nasal spray Commonly known as: ATROVENT Place 2 sprays into both nostrils 2 (two) times daily.   metFORMIN 500 MG tablet Commonly known as: GLUCOPHAGE TAKE ONE TABLET BY MOUTH TWICE DAILY TO CONTROL BLOOD SUGAR   montelukast 10 MG tablet Commonly known as: SINGULAIR TAKE 1 TABLET BY MOUTH EVERYDAY AT BEDTIME   omeprazole 40 MG capsule Commonly known as: PRILOSEC TAKE 1 CAPSULE BY MOUTH EVERY DAY   ondansetron 8  MG disintegrating tablet Commonly known as: Zofran ODT Take 1 tablet (8 mg total) by mouth every 8 (eight) hours as needed for nausea or vomiting.   ONE TOUCH ULTRA TEST test strip Generic drug: glucose blood Check blood sugar once daily as directed   polyethylene glycol powder 17 GM/SCOOP powder Commonly known as: GLYCOLAX/MIRALAX Take 17 g by mouth daily. Hold for loose stool   ProAir RespiClick [id number] (90 Base) MCG/ACT Aepb Generic drug:  Albuterol Sulfate Inhale 4 puffs into the lungs every 4 (four) hours as needed.   quinapril 20 MG tablet Commonly known as: ACCUPRIL Take 1 tablet (20 mg total) by mouth at bedtime.   saccharomyces boulardii 250 MG capsule Commonly known as: FLORASTOR Take 1 capsule (250 mg total) by mouth 2 (two) times daily. x10 days   tiZANidine 4 MG tablet Commonly known as: Zanaflex Take 1 tablet (4 mg total) by mouth every 6 (six) hours as needed for muscle spasms. For headaches.   topiramate 50 MG tablet Commonly known as: TOPAMAX Start with one pill(50mg ) at bedtime and increase to two pills(100mg ) at bedtime in one week.         Physical Exam: Vitals:   02/23/19 1037  BP: (!) 158/80  Pulse: 80  Temp: (!) 96.9 F (36.1 C)  TempSrc: Temporal  Weight: 191 lb 6.4 oz (86.8 kg)  Height: 5\' 3"  (1.6 m)   Body mass index is 33.9 kg/m. Wt Readings from Last 3 Encounters:  02/23/19 191 lb 6.4 oz (86.8 kg)  02/23/19 191 lb 6.4 oz (86.8 kg)  02/14/19 192 lb 3.2 oz (87.2 kg)    Physical Exam Constitutional:      General: She is not in acute distress.    Appearance: She is obese. She is not ill-appearing.  HENT:     Head: Normocephalic and atraumatic.     Right Ear: Tympanic membrane and external ear normal.     Left Ear: Tympanic membrane and external ear normal.     Nose: Nose normal. No congestion.     Mouth/Throat:     Mouth: Mucous membranes are moist.     Pharynx: Oropharynx is clear. No oropharyngeal exudate or posterior oropharyngeal erythema.  Eyes:     General: No scleral icterus.       Right eye: No discharge.        Left eye: No discharge.     Extraocular Movements: Extraocular movements intact.     Conjunctiva/sclera: Conjunctivae normal.     Pupils: Pupils are equal, round, and reactive to light.  Neck:     Musculoskeletal: Normal range of motion and neck supple.  Cardiovascular:     Rate and Rhythm: Normal rate and regular rhythm.     Pulses: Normal pulses.       Heart sounds: Murmur present. No friction rub. No gallop.   Pulmonary:     Effort: Pulmonary effort is normal. No respiratory distress.     Breath sounds: Normal breath sounds. No wheezing, rhonchi or rales.  Chest:     Chest wall: No tenderness.  Abdominal:     General: Bowel sounds are normal. There is no distension.     Palpations: Abdomen is soft. There is no mass.     Tenderness: There is no right CVA tenderness, left CVA tenderness, guarding or rebound. Negative signs include Murphy's sign.     Comments: Tenderness throughout abdomen.  Musculoskeletal:        General: No swelling or tenderness.  Right lower leg: No edema.     Left lower leg: No edema.     Comments: Wears left knee brace due to chronic pain  Skin:    General: Skin is dry.     Coloration: Skin is not pale.     Findings: No bruising or erythema.  Neurological:     Mental Status: She is alert and oriented to person, place, and time.     Cranial Nerves: No cranial nerve deficit.     Sensory: No sensory deficit.     Motor: No weakness.     Coordination: Coordination normal.     Gait: Gait abnormal (ambulates with cane, reports due to chronic left knee pain).  Psychiatric:        Mood and Affect: Mood normal.        Cognition and Memory: She exhibits impaired recent memory.     Labs reviewed: Basic Metabolic Panel: Recent Labs    12/13/18 1431 01/24/19 1121 02/08/19 1752 02/11/19 1009  NA 145* 137 143 141  K 3.9 3.8 3.5 3.8  CL 105 103 110 108  CO2 19* 23 24 22   GLUCOSE 113* 175* 123* 128*  BUN 10 14 18 13   CREATININE 0.78 1.28* 1.08* 1.12*  CALCIUM 10.0 9.8 9.8 9.6  TSH 0.517  --   --   --    Liver Function Tests: Recent Labs    01/28/19 0932 02/08/19 1752 02/11/19 1009  AST 583* 510* 310*  ALT 728* 503* 326*  ALKPHOS  --  176*  --   BILITOT 1.1 3.2* 2.1*  PROT 7.3 8.8* 7.3  ALBUMIN  --  3.8  --    Recent Labs    01/24/19 1121 02/08/19 1752  LIPASE 19 32  AMYLASE 28  --     No results for input(s): AMMONIA in the last 8760 hours. CBC: Recent Labs    01/24/19 1121 01/28/19 0932 02/08/19 1752  WBC 7.6 9.7 9.9  NEUTROABS 3,823 4,093  --   HGB 9.9* 9.6* 11.1*  HCT 29.9* 30.3* 34.4*  MCV 77.9* 81.5 79.3*  PLT 308 415* 314   Lipid Panel: Recent Labs    01/24/19 1121  CHOL 110  HDL 13*  LDLCALC 72  TRIG 168*  CHOLHDL 8.5*   Lab Results  Component Value Date   HGBA1C 6.9 (H) 01/24/2019    Procedures: US Abdomen Complete  Result Date: 02/04/2019 CLINICAL DATA:  Elevated liver enzymes. Abdominal pain and nausea for past 1-2 weeks. EXAM: ABDOMEN ULTRASOUND COMPLETE COMPARISON:  None. FINDINGS: Gallbladder: No gallstones or wall thickening visualized. No sonographic Murphy sign noted by sonographer. Common bile duct: Diameter: 2 mm, within normal limits. Liver: No focal lesion identified. Within normal limits in parenchymal echogenicity. Portal vein is patent on color Doppler imaging with normal direction of blood flow towards the liver. IVC: No abnormality visualized. Pancreas: Visualized portion unremarkable. Spleen: Size and appearance within normal limits. Right Kidney: Length: 10.6 cm. Echogenicity within normal limits. No mass or hydronephrosis visualized. Left Kidney: Length: 10.6 cm. Echogenicity within normal limits. No mass or hydronephrosis visualized. Abdominal aorta: No aneurysm visualized. Other findings: None. IMPRESSION: Normal study. No hepatobiliary or other significant abnormality identified. Electronically Signed   By: Marlaine Hind M.D.   On: 02/04/2019 09:43   Ct Abdomen Pelvis W Contrast  Result Date: 02/08/2019 CLINICAL DATA:  63 year old female with acute abdominal pain with nausea, vomiting and diarrhea. EXAM: CT ABDOMEN AND PELVIS WITH CONTRAST TECHNIQUE: Multidetector CT imaging of  the abdomen and pelvis was performed using the standard protocol following bolus administration of intravenous contrast. CONTRAST:  127mL OMNIPAQUE  IOHEXOL 300 MG/ML  SOLN COMPARISON:  07/30/2014 CT FINDINGS: Lower chest: No acute abnormality. Mild bibasilar scarring again noted. Hepatobiliary: The liver and gallbladder are unremarkable. No biliary dilatation. Pancreas: Unremarkable Spleen: Unremarkable Adrenals/Urinary Tract: The kidneys, adrenal glands and bladder are unremarkable. Stomach/Bowel: Stomach is within normal limits. Appendix appears normal. No evidence of bowel wall thickening, distention, or inflammatory changes. Vascular/Lymphatic: Aortic atherosclerosis. No enlarged abdominal or pelvic lymph nodes. Reproductive: Status post hysterectomy. No adnexal masses. Other: No ascites, focal collection or pneumoperitoneum. Musculoskeletal: No acute or suspicious bony abnormalities noted. Mild degenerative disc disease/spondylosis in the LOWER lumbar spine noted. IMPRESSION: 1. No evidence of acute abnormality. No CT findings to suggest a cause for this patient's abdominal pain. 2.  Aortic Atherosclerosis (ICD10-I70.0). Electronically Signed   By: Margarette Canada M.D.   On: 02/08/2019 19:54    Assessment/Plan 1. Essential hypertension, benign -pt reports she was previously on quinapril 40 mg daily with better control of blood pressure. Will increase at this time, encouraged to check blood pressure at home. Low sodium diet.  - EKG 12-Lead- showing incomplete left bbb, referral to cardiology  - COMPLETE METABOLIC PANEL WITH GFR - quinapril (ACCUPRIL) 40 MG tablet; Take 1 tablet (40 mg total) by mouth at bedtime.  Dispense: 90 tablet; Refill: 1  2. Type 2 diabetes mellitus without complication, without long-term current use of insulin (HCC) -a1c at goal. No hypoglycemia, will continue current regimen. - Ambulatory referral to Ophthalmology  3. Moderate persistent asthma in adult without complication Stable, without worsening of symptoms, continue current regimen.  4. Intractable chronic migraine without aura and without status  migrainosus Stable at this time.  5. Primary osteoarthritis of left knee Ongoing, plans to follow up with orthopedic.   6. Hyperlipidemia LDL goal <100 Off statin due to elevated liver enzymes  7. Nausea and vomiting, intractability of vomiting not specified, unspecified vomiting type No vomiting at this time. Nausea has improved. Continues on zofran as needed with GI follow up  8. Elevated liver enzymes -with nausea, overall doing some better but still with symptoms. Enzymes trending now on recent labs. Has GI follow up scheduled for tomorrow.  - COMPLETE METABOLIC PANEL WITH GFR  9. Anemia, unspecified type -continues on supplement.  - CBC with Differential/Platelet  10. Incomplete left bundle branch block Pt reports no chest pain or pressure at this time however states that when abdominal discomfort started she was having some pressure that would come and go in her chest. None recently and without current symptoms. Incomplete left bbb noted today on EKG, will refer to cardiologist for further evaluation at this time. Discussed going to ED for any chest pains, discomfort, shortness of breath, pt in agreement - Ambulatory referral to Cardiology  11. CPE -awv done today, she is s/p hysterectomy. Information provided on [place],  use of alcohol, regular self-examination of the breasts on a monthly basis, prevention of dental and periodontal disease, diet, regular sustained exercise for at least 30 minutes 5 times per week, routine screening interval for mammogram as recommended by the [place] and ACOG, importance of regular PAP smears, tobacco use,  and recommended schedule for GI hemoccult testing, colonoscopy, cholesterol, thyroid and diabetes screening.     Next appt: 6 weeks, sooner if needed  Serrena Linderman K. [place], [facility] [phone]

## 2019-02-23 NOTE — Progress Notes (Signed)
Subjective:   Tabitha Oneal is a 63 y.o. female who presents for Medicare Annual (Subsequent) preventive examination.  Review of Systems:   Cardiac Risk Factors include: diabetes mellitus;dyslipidemia;hypertension;obesity (BMI >30kg/m2)     Objective:     Vitals: BP (!) 158/80   Pulse 80   Temp (!) 96.9 F (36.1 C) (Temporal)   Ht 5\' 3"  (1.6 m)   Wt 191 lb 6.4 oz (86.8 kg)   SpO2 94%   BMI 33.90 kg/m   Body mass index is 33.9 kg/m.  Advanced Directives 02/23/2019 02/08/2019 01/24/2019 09/23/2018 02/11/2018 01/28/2017 07/31/2016  Does Patient Have a Medical Advance Directive? No Yes No No No Yes No  Type of Advance Directive - Living will - - - - -  Does patient want to make changes to medical advance directive? - - - - - Yes (MAU/Ambulatory/Procedural Areas - Information given) -  Would patient like information on creating a medical advance directive? Yes (MAU/Ambulatory/Procedural Areas - Information given) - Yes (MAU/Ambulatory/Procedural Areas - Information given) Yes (MAU/Ambulatory/Procedural Areas - Information given) Yes (MAU/Ambulatory/Procedural Areas - Information given) - Yes (MAU/Ambulatory/Procedural Areas - Information given)  Pre-existing out of facility DNR order (yellow form or pink MOST form) - - - - - - -    Tobacco Social History   Tobacco Use  Smoking Status Never Smoker  Smokeless Tobacco Never Used     Counseling given: Not Answered   Clinical Intake:  Pre-visit preparation completed: Yes  Pain : 0-10 Pain Score: 7  Pain Type: Acute pain Pain Location: Abdomen Pain Orientation: (generalized abdominal pain) Pain Descriptors / Indicators: (comes and goes, tender, ache) Pain Onset: More than a month ago Pain Frequency: Intermittent Effect of Pain on Daily Activities: decrease oral intake     BMI - recorded: 33.9 Nutritional Status: BMI > 30  Obese Nutritional Risks: Unintentional weight loss Diabetes: Yes CBG done?: No Did pt. bring  in CBG monitor from home?: No  How often do you need to have someone help you when you read instructions, pamphlets, or other written materials from your doctor or pharmacy?: 1 - Never What is the last grade level you completed in school?: 12 th grade and 2 years of college        Past Medical History:  Diagnosis Date  . Asthma    QVAR daily and Albuterol as needed  . Bladder infection    taking Keflex daily   . Chronic back pain    reason unknown  . Diabetes mellitus    takes Metformin and Tradjenta daily  . GERD (gastroesophageal reflux disease)    takes Omeprazole daily  . Hard of hearing   . Headache    daily.Takes Excedrine daily  . History of blood transfusion    no abnormal reaction noted  . Hyperlipidemia    takes Atorvastatin daily  . Hypertension    takes Quinapril daily  . Joint pain   . Joint swelling   . Nocturia   . Osteoarthritis of left knee 02/23/2012  . Seasonal allergies    takes Claritin and Singulair daily.Nasal spray as needed  . Shortness of breath dyspnea    with exertion   Past Surgical History:  Procedure Laterality Date  . ABDOMINAL HYSTERECTOMY    . INJECTION KNEE  04/09/2012   Procedure: KNEE INJECTION;  Surgeon: Johnny Bridge, MD;  Location: [place];  Service: Orthopedics;  Laterality: Left;  . JOINT REPLACEMENT     acl  bil knees  . KNEE ARTHROTOMY Left 08/28/2015   Procedure: LEFT KNEE ARTHROFIBROSIS EXCISION; pylectomy;  Surgeon: Meredith Pel, MD;  Location: [place];  Service: Orthopedics;  Laterality: Left;  . KNEE CLOSED REDUCTION  04/09/2012   Procedure: CLOSED MANIPULATION KNEE;  Surgeon: Johnny Bridge, MD;  Location: [place];  Service: Orthopedics;  Laterality: Left;  Manipulation Knee with Anesthesia includes Application of Traction   . TOTAL KNEE ARTHROPLASTY  02/23/2012   Procedure: TOTAL KNEE ARTHROPLASTY;  Surgeon: Johnny Bridge, MD;  Location: [place];  Service: Orthopedics;   Laterality: Left;   Family History  Problem Relation Age of Onset  . Bronchitis Father 12  . Alzheimer's disease Mother   . High blood pressure Sister   . High blood pressure Sister   . Headache Sister        pt thinks   . High blood pressure Sister   . High blood pressure Sister        pt thinks  . High blood pressure Sister   . High blood pressure Sister   . Headache Daughter   . Diabetes Daughter   . Diabetes Daughter   . Diabetes Other   . Colon cancer Neg Hx   . Esophageal cancer Neg Hx   . Rectal cancer Neg Hx   . Stomach cancer Neg Hx    Social History   Socioeconomic History  . Marital status: Widowed    Spouse name: Not on file  . Number of children: 4  . Years of education: 53  . Highest education level: Some college, no degree  Occupational History  . Not on file  Social Needs  . Financial resource strain: Somewhat hard  . Food insecurity    Worry: Sometimes true    Inability: Sometimes true  . Transportation needs    Medical: No    Non-medical: No  Tobacco Use  . Smoking status: Never Smoker  . Smokeless tobacco: Never Used  Substance and Sexual Activity  . Alcohol use: No    Alcohol/week: 0.0 standard drinks  . Drug use: No  . Sexual activity: Yes    Birth control/protection: Surgical  Lifestyle  . Physical activity    Days per week: 7 days    Minutes per session: 30 min  . Stress: To some extent  Relationships  . Social Herbalist on phone: Twice a week    Gets together: Once a week    Attends religious service: More than 4 times per year    Active member of club or organization: No    Attends meetings of clubs or organizations: Never    Relationship status: Widowed  Other Topics Concern  . Not on file  Social History Narrative   Diet: none   Do you drink/eat things with caffeine ? Coffee    Material status: widow     What year were you married? 08/01/1978   Do you live in a house, apartment, assisted living,condo,  trailer,ect.)? Townhouse (temporary)    Is it one or more stories? Yes   How many persons live in your home? Two   Do you have any pets in your home ? Yes, Shih-tzu   Current or past profession: none   Do you exercise? No  Type & how often: no   Do you have a living will ? No   Do you have a DNR form? No   If not, do you want to  discuss one?  Not now   Do you have signed POA /HPOA forms? No    If so, please bring to your appointment.            Update 12/13/2018: pt states she doesn't drink much coffee, tea   She lives alone                      Outpatient Encounter Medications as of 02/23/2019  Medication Sig  . Albuterol Sulfate (PROAIR RESPICLICK) [id number] (90 Base) MCG/ACT AEPB Inhale 4 puffs into the lungs every 4 (four) hours as needed.  Marland Kitchen azelastine (ASTELIN) 0.1 % nasal spray USE 1 SPRAY EACH NOSTRIL TWICE DAILY  . baclofen (LIORESAL) 10 MG tablet TAKE 1 TABLET BY MOUTH THREE TIMES A DAY AS NEEDED FOR MUSCLE SPASM  . benzonatate (TESSALON PERLES) 100 MG capsule Take 1 capsule (100 mg total) by mouth 3 (three) times daily as needed for cough.  . ciprofloxacin (CIPRO) 500 MG tablet Take 1 tablet (500 mg total) by mouth 2 (two) times daily. x7 days  . diclofenac sodium (VOLTAREN) 1 % GEL Apply 8 g topically 2 (two) times daily as needed. On knees  . ferrous sulfate 325 (65 FE) MG tablet TAKE 1 TABLET BY MOUTH EVERY DAY WITH BREAKFAST  . fluticasone (FLONASE) 50 MCG/ACT nasal spray Place 2 sprays into both nostrils daily.  . fluticasone furoate-vilanterol (BREO ELLIPTA) 200-25 MCG/INH AEPB Inhale 1 puff into the lungs daily.  Marland Kitchen ipratropium (ATROVENT) 0.03 % nasal spray Place 2 sprays into both nostrils 2 (two) times daily.  . metFORMIN (GLUCOPHAGE) 500 MG tablet TAKE ONE TABLET BY MOUTH TWICE DAILY TO CONTROL BLOOD SUGAR  . montelukast (SINGULAIR) 10 MG tablet TAKE 1 TABLET BY MOUTH EVERYDAY AT BEDTIME  . omeprazole (PRILOSEC) 40 MG capsule TAKE 1 CAPSULE BY MOUTH EVERY DAY  .  ondansetron (ZOFRAN ODT) 8 MG disintegrating tablet Take 1 tablet (8 mg total) by mouth every 8 (eight) hours as needed for nausea or vomiting.  . ONE TOUCH ULTRA TEST test strip Check blood sugar once daily as directed  . polyethylene glycol powder (GLYCOLAX/MIRALAX) 17 GM/SCOOP powder Take 17 g by mouth daily. Hold for loose stool  . quinapril (ACCUPRIL) 20 MG tablet Take 1 tablet (20 mg total) by mouth at bedtime.  . saccharomyces boulardii (FLORASTOR) 250 MG capsule Take 1 capsule (250 mg total) by mouth 2 (two) times daily. x10 days  . tiZANidine (ZANAFLEX) 4 MG tablet Take 1 tablet (4 mg total) by mouth every 6 (six) hours as needed for muscle spasms. For headaches.  . topiramate (TOPAMAX) 50 MG tablet Start with one pill(50mg ) at bedtime and increase to two pills(100mg ) at bedtime in one week.   No facility-administered encounter medications on file as of 02/23/2019.     Activities of Daily Living In your present state of health, do you have any difficulty performing the following activities: 02/23/2019  Hearing? Y  Vision? Y  Difficulty concentrating or making decisions? N  Walking or climbing stairs? Y  Dressing or bathing? N  Doing errands, shopping? Y  Preparing Food and eating ? Y  Comment having more nausea, has GI follow up  Using the Toilet? N  In the past six months, have you accidently leaked urine? N  Do you have problems with loss of bowel control? N  Managing your Medications? N  Managing your Finances? N  Housekeeping or managing your Housekeeping? N  Some recent data might be  hidden    Patient Care Team: [name], NP as PCP - General (Geriatric Medicine)    Assessment:   This is a routine wellness examination for Tabitha Oneal.  Exercise Activities and Dietary recommendations Current Exercise Habits: The patient does not participate in regular exercise at present, Exercise limited by: orthopedic condition(s)  Goals    . Exercise 3x per week (30 min per  time)     Patient will look into gyms and water exercises       Fall Risk Fall Risk  02/23/2019 02/23/2019 02/14/2019 01/24/2019 09/23/2018  Falls in the past year? 0 0 0 0 1  Number falls in past yr: 0 0 0 0 0  Injury with Fall? 0 0 0 0 0   Is the patient's home free of loose throw rugs in walkways, pet beds, electrical cords, etc?   yes      Grab bars in the bathroom? no      Handrails on the stairs?   yes      Adequate lighting?   yes  Timed Get Up and Go performed: na  Depression Screen PHQ 2/9 Scores 02/23/2019 02/23/2019 09/23/2018 02/11/2018  PHQ - 2 Score 0 0 0 0     Cognitive Function        Immunization History  Administered Date(s) Administered  . Fluad Quad(high Dose 65+) 01/24/2019  . Influenza Split 02/24/2012  . Influenza,inj,Quad PF,6+ Mos 04/13/2015, 02/22/2016, 01/28/2017, 02/11/2018  . Pneumococcal Polysaccharide-23 07/31/2016    Qualifies for Shingles Vaccine?yes   Screening Tests Health Maintenance  Topic Date Due  . HIV Screening  [birth year]/17/1969  . FOOT EXAM  02/04/2018  . TETANUS/TDAP  02/24/2019 (Originally 12/17/1973)  . OPHTHALMOLOGY EXAM  03/27/2019 (Originally 06/04/2018)  . HEMOGLOBIN A1C  07/24/2019  . MAMMOGRAM  12/27/2020  . COLONOSCOPY  02/03/2026  . INFLUENZA VACCINE  Completed  . Hepatitis C Screening  Completed  . PAP SMEAR-Modifier  Discontinued    Cancer Screenings: Lung: Low Dose CT Chest recommended if Age 59-80 years, 30 pack-year currently smoking OR have quit w/in 15years. Patient does not qualify. Breast:  Up to date on Mammogram? Yes   Up to date of Bone Density/Dexa? No Colorectal: up to date  Additional Screenings:  Hepatitis C Screening: done     Plan:      I have personally reviewed and noted the following in the patient's chart:   . Medical and social history . Use of alcohol, tobacco or illicit drugs  . Current medications and supplements . Functional ability and status . Nutritional status . Physical  activity . Advanced directives . List of other physicians . Hospitalizations, surgeries, and ER visits in previous 12 months . Vitals . Screenings to include cognitive, depression, and falls . Referrals and appointments  In addition, I have reviewed and discussed with patient certain preventive protocols, quality metrics, and best practice recommendations. A written personalized care plan for preventive services as well as general preventive health recommendations were provided to patient.     [name], NP  02/23/2019

## 2019-02-24 ENCOUNTER — Telehealth: Payer: Self-pay | Admitting: Cardiology

## 2019-02-24 ENCOUNTER — Telehealth: Payer: Self-pay | Admitting: *Deleted

## 2019-02-24 ENCOUNTER — Encounter: Payer: Self-pay | Admitting: Gastroenterology

## 2019-02-24 ENCOUNTER — Other Ambulatory Visit: Payer: Self-pay

## 2019-02-24 ENCOUNTER — Ambulatory Visit: Payer: Medicare Other | Admitting: Gastroenterology

## 2019-02-24 VITALS — Temp 98.2°F | Ht 63.0 in | Wt 189.5 lb

## 2019-02-24 DIAGNOSIS — R945 Abnormal results of liver function studies: Secondary | ICD-10-CM

## 2019-02-24 DIAGNOSIS — R112 Nausea with vomiting, unspecified: Secondary | ICD-10-CM | POA: Diagnosis not present

## 2019-02-24 DIAGNOSIS — R7989 Other specified abnormal findings of blood chemistry: Secondary | ICD-10-CM

## 2019-02-24 DIAGNOSIS — R197 Diarrhea, unspecified: Secondary | ICD-10-CM

## 2019-02-24 LAB — CBC WITH DIFFERENTIAL/PLATELET
Absolute Monocytes: 506 cells/uL (ref 200–950)
Basophils Absolute: 73 cells/uL (ref 0–200)
Basophils Relative: 1.2 %
Eosinophils Absolute: 494 cells/uL (ref 15–500)
Eosinophils Relative: 8.1 %
HCT: 32.5 % — ABNORMAL LOW (ref 35.0–45.0)
Hemoglobin: 10.2 g/dL — ABNORMAL LOW (ref 11.7–15.5)
Lymphs Abs: 1348 cells/uL (ref 850–3900)
MCH: 25.6 pg — ABNORMAL LOW (ref 27.0–33.0)
MCHC: 31.4 g/dL — ABNORMAL LOW (ref 32.0–36.0)
MCV: 81.5 fL (ref 80.0–100.0)
MPV: 13.5 fL — ABNORMAL HIGH (ref 7.5–12.5)
Monocytes Relative: 8.3 %
Neutro Abs: 3678 cells/uL (ref 1500–7800)
Neutrophils Relative %: 60.3 %
Platelets: 275 10*3/uL (ref 140–400)
RBC: 3.99 10*6/uL (ref 3.80–5.10)
RDW: 17.6 % — ABNORMAL HIGH (ref 11.0–15.0)
Total Lymphocyte: 22.1 %
WBC: 6.1 10*3/uL (ref 3.8–10.8)

## 2019-02-24 LAB — COMPLETE METABOLIC PANEL WITH GFR
AG Ratio: 1 (calc) (ref 1.0–2.5)
ALT: 111 U/L — ABNORMAL HIGH (ref 6–29)
AST: 76 U/L — ABNORMAL HIGH (ref 10–35)
Albumin: 3.6 g/dL (ref 3.6–5.1)
Alkaline phosphatase (APISO): 126 U/L (ref 37–153)
BUN/Creatinine Ratio: 7 (calc) (ref 6–22)
BUN: 6 mg/dL — ABNORMAL LOW (ref 7–25)
CO2: 26 mmol/L (ref 20–32)
Calcium: 10 mg/dL (ref 8.6–10.4)
Chloride: 106 mmol/L (ref 98–110)
Creat: 0.91 mg/dL (ref 0.50–0.99)
GFR, Est African American: 77 mL/min/{1.73_m2} (ref >=60)
GFR, Est Non African American: 67 mL/min/{1.73_m2} (ref >=60)
Globulin: 3.7 g/dL (calc) (ref 1.9–3.7)
Glucose, Bld: 113 mg/dL — ABNORMAL HIGH (ref 65–99)
Potassium: 3.8 mmol/L (ref 3.5–5.3)
Sodium: 140 mmol/L (ref 135–146)
Total Bilirubin: 2.8 mg/dL — ABNORMAL HIGH (ref 0.2–1.2)
Total Protein: 7.3 g/dL (ref 6.1–8.1)

## 2019-02-24 NOTE — Patient Instructions (Signed)
If you are age 64 or older, your body mass index should be between 23-30. Your Body mass index is 33.57 kg/m. If this is out of the aforementioned range listed, please consider follow up with your Primary Care Provider.  If you are age 70 or younger, your body mass index should be between 19-25. Your Body mass index is 33.57 kg/m. If this is out of the aformentioned range listed, please consider follow up with your Primary Care Provider.   You have been scheduled for an endoscopy. Please follow written instructions given to you at your visit today. If you use inhalers (even only as needed), please bring them with you on the day of your procedure. Your physician has requested that you go to www.startemmi.com and enter the access code given to you at your visit today. This web site gives a general overview about your procedure. However, you should still follow specific instructions given to you by our office regarding your preparation for the procedure.   Please go to the lab at [facility] ([place] You will need to go to level "B", you do not need an appointment for this. Hours available are 7:30 am - 4:30 pm. Please do your labs in 4 weeks.   Stop Miralax.   Thank you,  Dr. Jackquline Denmark

## 2019-02-24 NOTE — Telephone Encounter (Signed)
Spoke with patient regarding her medication, she stated that she just left from her GI doctor.She will call her neuro doctor if she has anymore questions.

## 2019-02-24 NOTE — Telephone Encounter (Signed)
Spoke with patient regarding her labs, and she had a question regarding her medication Topamax. She wants to know if she should continue taking her Zanaflex or should she resume that medication after she finish taking the Topamax. Please Advise!

## 2019-02-24 NOTE — Telephone Encounter (Signed)
She takes the Topamax routinely for headache prevention and the Zanaflex as needed

## 2019-02-24 NOTE — Telephone Encounter (Signed)
LVM for patient to call and schedule a new patient appointment. °

## 2019-02-24 NOTE — Progress Notes (Signed)
Chief Complaint:   Referring Provider:  [name], NP      ASSESSMENT AND PLAN;   #1.  N/V  #2.  Diarrhea likely d/t continued MiraLAX. Neg colon 01/2016.  Next colon in 10 years.  Earlier, if with any new or continued problems.  Patient also on metformin but does not cause her to have diarrhea.  #3. Abn LFTs likely d/t DILI. (better since off lipitor and Tradjenta). Neg [place], CT AP 01/2019. Neg acute hep panel, ASMA, AMA, A1AT, celiac screen, ceruloplasmin.   Plan: - Stop miralax - EGD tomorrow at 8;30.  I have discussed risks and benefits with the patient and patient's daughter in detail. - Continue omeprazole 40 mg p.o. once a day. - CBC, CMP in 4 weeks. - FU in 8 weeks.    HPI:    Tabitha Oneal is a 63 y.o. female  Accompanied by her daughter (very smart with good medical knowledge, who works at a group home) Patient with intermittent nausea/vomiting-over last 6 to 8 weeks.  Getting slightly worse.  She denies having any significant weight loss.  No fever chills or night sweats.  Seen in the emergency room and was found to have significantly elevated liver function tests.  She underwent ultrasound, CT Abdo/pelvis which was unremarkable.  This was thought that she had problems due to the Lipitor/Tradjenta.  Liver function tests have been coming down well as below.  Also having diarrhea 3-4 times per day without any abdominal pain.  She has been taking MiraLAX every day.  No melena or hematochezia.  No fever chills or night sweats.  Had colonoscopy 02/04/2016-4 mm benign sessile polyp status post polypectomy. Bx-no adenomatous changes.  Advised to have next colonoscopy in 10 years.  No history of itching, skin lesions, easy bruisability, intake of over-the-counter medications including diet pills, herbal medications, anabolic steroids or Tylenol.  There is no history of blood transfusions, IV drug use or family history of liver disease.  No jaundice dark  urine or pale stools.  No history of alcohol abuse.   Past Medical History:  Diagnosis Date  . Asthma    QVAR daily and Albuterol as needed  . Bladder infection    taking Keflex daily   . Chronic back pain    reason unknown  . Diabetes mellitus    takes Metformin and Tradjenta daily  . GERD (gastroesophageal reflux disease)    takes Omeprazole daily  . Hard of hearing   . Headache    daily.Takes Excedrine daily  . Hepatitis C   . History of blood transfusion    no abnormal reaction noted  . Hyperlipidemia    takes Atorvastatin daily  . Hypertension    takes Quinapril daily  . Joint pain   . Joint swelling   . Nocturia   . Obesity   . Osteoarthritis of left knee 02/23/2012  . Seasonal allergies    takes Claritin and Singulair daily.Nasal spray as needed  . Shortness of breath dyspnea    with exertion    Past Surgical History:  Procedure Laterality Date  . ABDOMINAL HYSTERECTOMY    . INJECTION KNEE  04/09/2012   Procedure: KNEE INJECTION;  Surgeon: Johnny Bridge, MD;  Location: [place];  Service: Orthopedics;  Laterality: Left;  . JOINT REPLACEMENT     acl   bil knees  . KNEE ARTHROTOMY Left 08/28/2015   Procedure: LEFT KNEE ARTHROFIBROSIS EXCISION; pylectomy;  Surgeon: Meredith Pel,  MD;  Location: [place] [facility];  Service: Orthopedics;  Laterality: Left;  . KNEE CLOSED REDUCTION  04/09/2012   Procedure: CLOSED MANIPULATION KNEE;  Surgeon: Johnny Bridge, MD;  Location: [place];  Service: Orthopedics;  Laterality: Left;  Manipulation Knee with Anesthesia includes Application of Traction   . TOTAL KNEE ARTHROPLASTY  02/23/2012   Procedure: TOTAL KNEE ARTHROPLASTY;  Surgeon: Johnny Bridge, MD;  Location: [place];  Service: Orthopedics;  Laterality: Left;    Family History  Problem Relation Age of Onset  . Bronchitis Father 23  . Alzheimer's disease Mother   . Ovarian cancer Mother   . Stomach cancer Mother   . High blood pressure  Sister   . High blood pressure Sister   . Headache Sister        pt thinks   . High blood pressure Sister   . High blood pressure Sister        pt thinks  . Breast cancer Sister   . High blood pressure Sister   . High blood pressure Sister   . Headache Daughter   . Diabetes Daughter   . Diabetes Daughter   . Diabetes Other   . Colon cancer Neg Hx   . Esophageal cancer Neg Hx   . Rectal cancer Neg Hx     Social History   Tobacco Use  . Smoking status: Never Smoker  . Smokeless tobacco: Never Used  Substance Use Topics  . Alcohol use: No    Alcohol/week: 0.0 standard drinks  . Drug use: No    Current Outpatient Medications  Medication Sig Dispense Refill  . Albuterol Sulfate (PROAIR RESPICLICK) [id number] (90 Base) MCG/ACT AEPB Inhale 4 puffs into the lungs every 4 (four) hours as needed. 1 each 1  . azelastine (ASTELIN) 0.1 % nasal spray USE 1 SPRAY EACH NOSTRIL TWICE DAILY 30 mL 3  . baclofen (LIORESAL) 10 MG tablet TAKE 1 TABLET BY MOUTH THREE TIMES A DAY AS NEEDED FOR MUSCLE SPASM    . benzonatate (TESSALON PERLES) 100 MG capsule Take 1 capsule (100 mg total) by mouth 3 (three) times daily as needed for cough. 20 capsule 1  . ciprofloxacin (CIPRO) 500 MG tablet Take 1 tablet (500 mg total) by mouth 2 (two) times daily. x7 days 14 tablet 0  . diclofenac sodium (VOLTAREN) 1 % GEL Apply 8 g topically 2 (two) times daily as needed. On knees    . ferrous sulfate 325 (65 FE) MG tablet TAKE 1 TABLET BY MOUTH EVERY DAY WITH BREAKFAST 90 tablet 1  . fluticasone (FLONASE) 50 MCG/ACT nasal spray Place 2 sprays into both nostrils daily. 48 g 2  . fluticasone furoate-vilanterol (BREO ELLIPTA) 200-25 MCG/INH AEPB Inhale 1 puff into the lungs daily. 60 each 5  . ipratropium (ATROVENT) 0.03 % nasal spray Place 2 sprays into both nostrils 2 (two) times daily. 90 mL 1  . metFORMIN (GLUCOPHAGE) 500 MG tablet TAKE ONE TABLET BY MOUTH TWICE DAILY TO CONTROL BLOOD SUGAR 180 tablet 2  . montelukast  (SINGULAIR) 10 MG tablet TAKE 1 TABLET BY MOUTH EVERYDAY AT BEDTIME 90 tablet 5  . omeprazole (PRILOSEC) 40 MG capsule TAKE 1 CAPSULE BY MOUTH EVERY DAY 90 capsule 1  . ondansetron (ZOFRAN ODT) 8 MG disintegrating tablet Take 1 tablet (8 mg total) by mouth every 8 (eight) hours as needed for nausea or vomiting. 30 tablet 1  . ONE TOUCH ULTRA TEST test strip Check blood sugar once daily as  directed  3  . polyethylene glycol powder (GLYCOLAX/MIRALAX) 17 GM/SCOOP powder Take 17 g by mouth daily. Hold for loose stool 3350 g 1  . quinapril (ACCUPRIL) 40 MG tablet Take 1 tablet (40 mg total) by mouth at bedtime. 90 tablet 1  . saccharomyces boulardii (FLORASTOR) 250 MG capsule Take 1 capsule (250 mg total) by mouth 2 (two) times daily. x10 days 20 capsule 0  . tiZANidine (ZANAFLEX) 4 MG tablet Take 1 tablet (4 mg total) by mouth every 6 (six) hours as needed for muscle spasms. For headaches. 90 tablet 3  . topiramate (TOPAMAX) 50 MG tablet Start with one pill(50mg ) at bedtime and increase to two pills(100mg ) at bedtime in one week. 30 tablet 6   No current facility-administered medications for this visit.     No Known Allergies  Review of Systems:  Constitutional: Denies fever, chills, diaphoresis, appetite change and fatigue.  HEENT: Denies photophobia, eye pain, redness, hearing loss, ear pain, congestion, sore throat, rhinorrhea, sneezing, mouth sores, neck pain, neck stiffness and tinnitus.   Respiratory: Denies SOB, DOE, cough, chest tightness,  and wheezing.   Cardiovascular: Denies chest pain, palpitations and leg swelling.  Genitourinary: Denies dysuria, urgency, frequency, hematuria, flank pain and difficulty urinating.  Musculoskeletal: has myalgias, back pain, joint swelling, arthralgias and gait problem.  Skin: No rash.  Neurological: Denies dizziness, seizures, syncope, weakness, light-headedness, numbness and headaches.  Hematological: Denies adenopathy. Easy bruising, personal or  family bleeding history  Psychiatric/Behavioral: has  anxiety or depression     Physical Exam:    Temp 98.2 F (36.8 C)   Ht 5\' 3"  (1.6 m)   Wt 189 lb 8 oz (86 kg)   BMI 33.57 kg/m  Filed Weights   02/24/19 1009  Weight: 189 lb 8 oz (86 kg)   Constitutional:  Well-developed, in no acute distress. Psychiatric: Normal mood and affect. Behavior is normal. HEENT: Pupils normal.  Conjunctivae are normal. No scleral icterus. Neck supple.  Cardiovascular: Normal rate, regular rhythm. No edema Pulmonary/chest: Effort normal and breath sounds normal. No wheezing, rales or rhonchi. Abdominal: Soft, nondistended. Nontender. Bowel sounds active throughout. There are no masses palpable. No hepatomegaly. Rectal:  defered Neurological: Alert and oriented to person place and time. Skin: Skin is warm and dry. No rashes noted.  Data Reviewed: I have personally reviewed following labs and imaging studies  CBC: CBC Latest Ref Rng & Units 02/23/2019 02/08/2019 01/28/2019  WBC 3.8 - 10.8 Thousand/uL 6.1 9.9 9.7  Hemoglobin 11.7 - 15.5 g/dL 10.2(L) 11.1(L) 9.6(L)  Hematocrit 35.0 - 45.0 % 32.5(L) 34.4(L) 30.3(L)  Platelets 140 - 400 Thousand/uL 275 314 415(H)    CMP: CMP Latest Ref Rng & Units 02/23/2019 02/11/2019 02/08/2019  Glucose 65 - 99 mg/dL 113(H) 128(H) 123(H)  BUN 7 - 25 mg/dL 6(L) 13 18  Creatinine 0.50 - 0.99 mg/dL 0.91 1.12(H) 1.08(H)  Sodium 135 - 146 mmol/L 140 141 143  Potassium 3.5 - 5.3 mmol/L 3.8 3.8 3.5  Chloride 98 - 110 mmol/L 106 108 110  CO2 20 - 32 mmol/L 26 22 24   Calcium 8.6 - 10.4 mg/dL 10.0 9.6 9.8  Total Protein 6.1 - 8.1 g/dL 7.3 7.3 8.8(H)  Total Bilirubin 0.2 - 1.2 mg/dL 2.8(H) 2.1(H) 3.2(H)  Alkaline Phos 38 - 126 U/L - - 176(H)  AST 10 - 35 U/L 76(H) 310(H) 510(H)  ALT 6 - 29 U/L 111(H) 326(H) 503(H)    GFR: Estimated Creatinine Clearance: 64.9 mL/min (by C-G formula based on  SCr of 0.91 mg/dL). Liver Function Tests: Recent Labs  Lab 02/23/19 1152   AST 76*  ALT 111*  BILITOT 2.8*  PROT 7.3    Recent Results (from the past 240 hour(s))  Culture, Urine     Status: Abnormal   Collection Time: 02/14/19 10:40 AM   Specimen: Urine  Result Value Ref Range Status   MICRO NUMBER: [id number]  Final   SPECIMEN QUALITY: Adequate  Final   Sample Source URINE, CLEAN CATCH  Final   STATUS: FINAL  Final   ISOLATE 1: Escherichia coli (A)  Final    Comment: Greater than 100,000 CFU/mL of Escherichia coli      Susceptibility   Escherichia coli - URINE CULTURE, REFLEX    AMOX/CLAVULANIC <=2 Sensitive     AMPICILLIN 4 Sensitive     AMPICILLIN/SULBACTAM <=2 Sensitive     CEFAZOLIN* <=4 Not Reportable      * For infections other than uncomplicated UTIcaused by E. coli, K. pneumoniae or P. mirabilis:Cefazolin is resistant if MIC > or = 8 mcg/mL.(Distinguishing susceptible versus intermediatefor isolates with MIC < or = 4 mcg/mL requiresadditional testing.)For uncomplicated UTI caused by E. coli,K. pneumoniae or P. mirabilis: Cefazolin issusceptible if MIC <32 mcg/mL and predictssusceptible to the oral agents cefaclor, cefdinir,cefpodoxime, cefprozil, cefuroxime, cephalexinand loracarbef.    CEFEPIME <=1 Sensitive     CEFTRIAXONE <=1 Sensitive     CIPROFLOXACIN <=0.25 Sensitive     LEVOFLOXACIN <=0.12 Sensitive     ERTAPENEM <=0.5 Sensitive     GENTAMICIN <=1 Sensitive     IMIPENEM <=0.25 Sensitive     NITROFURANTOIN <=16 Sensitive     PIP/TAZO <=4 Sensitive     TOBRAMYCIN <=1 Sensitive     TRIMETH/SULFA* <=20 Sensitive      * For infections other than uncomplicated UTIcaused by E. coli, K. pneumoniae or P. mirabilis:Cefazolin is resistant if MIC > or = 8 mcg/mL.(Distinguishing susceptible versus intermediatefor isolates with MIC < or = 4 mcg/mL requiresadditional testing.)For uncomplicated UTI caused by E. coli,K. pneumoniae or P. mirabilis: Cefazolin issusceptible if MIC <32 mcg/mL and predictssusceptible to the oral agents cefaclor,  cefdinir,cefpodoxime, cefprozil, cefuroxime, cephalexinand loracarbef.Legend:S = Susceptible  I = IntermediateR = Resistant  NS = Not susceptible* = Not tested  NR = Not reported**NN = See antimicrobic comments      Radiology Studies: US Abdomen Complete  Result Date: 02/04/2019 CLINICAL DATA:  Elevated liver enzymes. Abdominal pain and nausea for past 1-2 weeks. EXAM: ABDOMEN ULTRASOUND COMPLETE COMPARISON:  None. FINDINGS: Gallbladder: No gallstones or wall thickening visualized. No sonographic Murphy sign noted by sonographer. Common bile duct: Diameter: 2 mm, within normal limits. Liver: No focal lesion identified. Within normal limits in parenchymal echogenicity. Portal vein is patent on color Doppler imaging with normal direction of blood flow towards the liver. IVC: No abnormality visualized. Pancreas: Visualized portion unremarkable. Spleen: Size and appearance within normal limits. Right Kidney: Length: 10.6 cm. Echogenicity within normal limits. No mass or hydronephrosis visualized. Left Kidney: Length: 10.6 cm. Echogenicity within normal limits. No mass or hydronephrosis visualized. Abdominal aorta: No aneurysm visualized. Other findings: None. IMPRESSION: Normal study. No hepatobiliary or other significant abnormality identified. Electronically Signed   By: Marlaine Hind M.D.   On: 02/04/2019 09:43   Ct Abdomen Pelvis W Contrast  Result Date: 02/08/2019 CLINICAL DATA:  63 year old female with acute abdominal pain with nausea, vomiting and diarrhea. EXAM: CT ABDOMEN AND PELVIS WITH CONTRAST TECHNIQUE: Multidetector CT imaging of the abdomen and pelvis was performed  using the standard protocol following bolus administration of intravenous contrast. CONTRAST:  180mL OMNIPAQUE IOHEXOL 300 MG/ML  SOLN COMPARISON:  07/30/2014 CT FINDINGS: Lower chest: No acute abnormality. Mild bibasilar scarring again noted. Hepatobiliary: The liver and gallbladder are unremarkable. No biliary dilatation. Pancreas:  Unremarkable Spleen: Unremarkable Adrenals/Urinary Tract: The kidneys, adrenal glands and bladder are unremarkable. Stomach/Bowel: Stomach is within normal limits. Appendix appears normal. No evidence of bowel wall thickening, distention, or inflammatory changes. Vascular/Lymphatic: Aortic atherosclerosis. No enlarged abdominal or pelvic lymph nodes. Reproductive: Status post hysterectomy. No adnexal masses. Other: No ascites, focal collection or pneumoperitoneum. Musculoskeletal: No acute or suspicious bony abnormalities noted. Mild degenerative disc disease/spondylosis in the LOWER lumbar spine noted. IMPRESSION: 1. No evidence of acute abnormality. No CT findings to suggest a cause for this patient's abdominal pain. 2.  Aortic Atherosclerosis (ICD10-I70.0). Electronically Signed   By: Margarette Canada M.D.   On: 02/08/2019 19:54  CT reviewed independently.  Labs reviewed independently Discussed extensively with the patient and patient's daughter.    Carmell Austria, MD 02/24/2019, 10:16 AM  Cc: [name], NP

## 2019-02-25 ENCOUNTER — Ambulatory Visit (AMBULATORY_SURGERY_CENTER): Payer: Medicare Other | Admitting: Gastroenterology

## 2019-02-25 ENCOUNTER — Other Ambulatory Visit: Payer: Self-pay | Admitting: Gastroenterology

## 2019-02-25 ENCOUNTER — Other Ambulatory Visit: Payer: Self-pay

## 2019-02-25 ENCOUNTER — Telehealth: Payer: Self-pay | Admitting: Gastroenterology

## 2019-02-25 ENCOUNTER — Encounter: Payer: Self-pay | Admitting: Gastroenterology

## 2019-02-25 VITALS — BP 162/82 | HR 84 | Temp 98.1°F | Resp 19 | Ht 63.0 in | Wt 189.0 lb

## 2019-02-25 DIAGNOSIS — R112 Nausea with vomiting, unspecified: Secondary | ICD-10-CM

## 2019-02-25 DIAGNOSIS — K2970 Gastritis, unspecified, without bleeding: Secondary | ICD-10-CM

## 2019-02-25 DIAGNOSIS — K317 Polyp of stomach and duodenum: Secondary | ICD-10-CM | POA: Diagnosis not present

## 2019-02-25 MED ORDER — SODIUM CHLORIDE 0.9 % IV SOLN: 500.0000 mL | Freq: Once | INTRAVENOUS | Status: DC

## 2019-02-25 NOTE — Op Note (Signed)
[facility] Patient Name: Tabitha Oneal Procedure Date: 02/25/2019 8:16 AM MRN: [id number] Endoscopist: Jackquline Denmark , MD Age: 64 Referring MD:  Date of Birth: 1956/03/08 Gender: Female Account #: [id number] Procedure:                Upper GI endoscopy Indications:              Epigastric abdominal pain, N/V Medicines:                Monitored Anesthesia Care Procedure:                Pre-Anesthesia Assessment:                           - Prior to the procedure, a History and Physical                            was performed, and patient medications and                            allergies were reviewed. The patient's tolerance of                            previous anesthesia was also reviewed. The risks                            and benefits of the procedure and the sedation                            options and risks were discussed with the patient.                            All questions were answered, and informed consent                            was obtained. Prior Anticoagulants: The patient has                            taken no previous anticoagulant or antiplatelet                            agents. ASA Grade Assessment: III - A patient with                            severe systemic disease. After reviewing the risks                            and benefits, the patient was deemed in                            satisfactory condition to undergo the procedure.                           After obtaining informed consent, the endoscope was  passed under direct vision. Throughout the                            procedure, the patient's blood pressure, pulse, and                            oxygen saturations were monitored continuously. The                            Endoscope was introduced through the mouth, and                            advanced to the second part of duodenum. The upper                            GI endoscopy was  accomplished without difficulty.                            The patient tolerated the procedure well. Scope In: Scope Out: Findings:                 The examined esophagus was normal. Well-defined Z                            line at 36 cm. Examined by NBI                           Localized mild inflammation characterized by                            erythema was found in the gastric antrum. Biopsies                            were taken with a cold forceps for histology.                           A single 12 mm semi-pedunculated polyp (on a thick                            pedicle) with no bleeding and no stigmata of recent                            bleeding was found in the gastric antrum. The polyp                            was removed with a hot snare. Resection and                            retrieval were complete. Estimated blood loss: none.                           The examined duodenum was normal. Biopsies for  histology were taken with a cold forceps for                            evaluation of celiac disease. Complications:            No immediate complications. Estimated Blood Loss:     Estimated blood loss: none. Impression:               - Mild gastritis                           - A single gastric polyp. Resected and retrieved. Recommendation:           - Patient has a contact number available for                            emergencies. The signs and symptoms of potential                            delayed complications were discussed with the                            patient. Return to normal activities tomorrow.                            Written discharge instructions were provided to the                            patient. Watch for any delayed including bleeding.                           - Resume previous diet.                           - Continue present medications.                           - Await pathology results.                            - No aspirin, ibuprofen, naproxen, or other                            non-steroidal anti-inflammatory drugs for 7 days                            after polyp removal. Jackquline Denmark, MD 02/25/2019 8:45:51 AM This report has been signed electronically.

## 2019-02-25 NOTE — Patient Instructions (Signed)
Impression/Recommendations:  Gastritis handout given to patient.  Resume previous diet. Continue present medications. Await pathology results.  As aspirin,ibuprofen, naproxen, or other NSAID drugs for 7 days after polyp removal.  YOU HAD AN ENDOSCOPIC PROCEDURE TODAY AT [facility]:   Refer to the procedure report that was given to you for any specific questions about what was found during the examination.  If the procedure report does not answer your questions, please call your gastroenterologist to clarify.  If you requested that your care partner not be given the details of your procedure findings, then the procedure report has been included in a sealed envelope for you to review at your convenience later.  YOU SHOULD EXPECT: Some feelings of bloating in the abdomen. Passage of more gas than usual.  Walking can help get rid of the air that was put into your GI tract during the procedure and reduce the bloating. If you had a lower endoscopy (such as a colonoscopy or flexible sigmoidoscopy) you may notice spotting of blood in your stool or on the toilet paper. If you underwent a bowel prep for your procedure, you may not have a normal bowel movement for a few days.  Please Note:  You might notice some irritation and congestion in your nose or some drainage.  This is from the oxygen used during your procedure.  There is no need for concern and it should clear up in a day or so.  SYMPTOMS TO REPORT IMMEDIATELY:  Following upper endoscopy (EGD)  Vomiting of blood or coffee ground material  New chest pain or pain under the shoulder blades  Painful or persistently difficult swallowing  New shortness of breath  Fever of 100F or higher  Black, tarry-looking stools  For urgent or emergent issues, a gastroenterologist can be reached at any hour by calling [phone].   DIET:  We do recommend a small meal at first, but then you may proceed to your regular diet.  Drink plenty  of fluids but you should avoid alcoholic beverages for 24 hours.  ACTIVITY:  You should plan to take it easy for the rest of today and you should NOT DRIVE or use heavy machinery until tomorrow (because of the sedation medicines used during the test).    FOLLOW UP: Our staff will call the number listed on your records 48-72 hours following your procedure to check on you and address any questions or concerns that you may have regarding the information given to you following your procedure. If we do not reach you, we will leave a message.  We will attempt to reach you two times.  During this call, we will ask if you have developed any symptoms of COVID 19. If you develop any symptoms (ie: fever, flu-like symptoms, shortness of breath, cough etc.) before then, please call [phone].  If you test positive for Covid 19 in the 2 weeks post procedure, please call and report this information to [place].    If any biopsies were taken you will be contacted by phone or by letter within the next 1-3 weeks.  Please call us at [phone] if you have not heard about the biopsies in 3 weeks.    SIGNATURES/CONFIDENTIALITY: You and/or your care partner have signed paperwork which will be entered into your electronic medical record.  These signatures attest to the fact that that the information above on your After Visit Summary has been reviewed and is understood.  Full responsibility of the confidentiality of this  discharge information lies with you and/or your care-partner. 

## 2019-02-25 NOTE — Telephone Encounter (Signed)
Called and spoke with patient- patient is requesting to know if she can take her medications now that she is done with the procedure-patient advised as long as they are not NSAIDS she can take them (Omeprazole and Topamax); patient advised to call back to the office for further questions/concerns arise; Patient verbalized understanding of information/instructions;

## 2019-02-25 NOTE — Progress Notes (Signed)
Called to room to assist during endoscopic procedure.  Patient ID and intended procedure confirmed with present staff. Received instructions for my participation in the procedure from the performing physician.  

## 2019-02-25 NOTE — Progress Notes (Signed)
Pt. Having lower abdominal discomfort that she states she did not have prior to the procedure.  Pt. Assisted to the restroom by S. Monday RN.  Pt. Able to void, did not pass any air.  Pt. Still reports lower abdominal discomfort.  Pt. Told to walk around at home as she feels safe doing so, and that having hot drinks may assist in passing air, if that is a contributing factor to her discomfort.  Pt. Waiting in RR so Dr. Lyndel Safe can give suggestions to relieve discomfort.

## 2019-02-25 NOTE — Progress Notes (Signed)
History reviewed today  Temp KA VS [place]

## 2019-02-25 NOTE — Progress Notes (Signed)
Pt. Put on stretcher in RR for Dr. Lyndel Safe to evaluate discomfort.  Discomfort attributed to trapped air.  Dr. Lyndel Safe spoke to pt.'s daughter via phone.  Pt. Released for discharge.  Pt. Given 24 hour number, pt. Reports discomfort is at a 6.  Abdomen soft.  Pt. Not grimacing or looking in distress.

## 2019-02-25 NOTE — Progress Notes (Signed)
To PACU, VSS. Report to Rn.tb 

## 2019-02-26 ENCOUNTER — Other Ambulatory Visit: Payer: Self-pay | Admitting: Neurology

## 2019-02-27 ENCOUNTER — Telehealth: Payer: Self-pay | Admitting: Neurology

## 2019-02-27 NOTE — Telephone Encounter (Signed)
Patient asked for a refill of Tizanidine but the meds interaction checker states cipro and tizanidine are contraindicated. I will refill it but she has to know she cannot take it until her cipro is completed. thanks

## 2019-02-28 ENCOUNTER — Encounter: Payer: Self-pay | Admitting: *Deleted

## 2019-02-28 ENCOUNTER — Other Ambulatory Visit: Payer: Self-pay | Admitting: Neurology

## 2019-02-28 MED ORDER — TIZANIDINE HCL 4 MG PO TABS: 4.0000 mg | ORAL_TABLET | Freq: Four times a day (QID) | ORAL | 3 refills | Status: DC | PRN

## 2019-02-28 NOTE — Telephone Encounter (Signed)
Pt stated she finished the Cipro after 7 days, she was informed of below information  FYI

## 2019-02-28 NOTE — Telephone Encounter (Addendum)
Called pt & LVM (Ok per [facility]) asking for call back. Advised Dr. Jaynee Eagles did authorize a refill of the Tizanidine but it is contraindicated with Cipro, so Dr. Jaynee Eagles wanted to make sure she knew she cannot take it until the Cipro is completed. I asked for a call back or mychart message to confirm receipt of this message.   Sent pt a mychart message too.

## 2019-02-28 NOTE — Telephone Encounter (Signed)
Noted thanks °

## 2019-03-01 ENCOUNTER — Telehealth: Payer: Self-pay

## 2019-03-01 NOTE — Telephone Encounter (Signed)
  Follow up Call-  Call back number 02/25/2019  Post procedure Call Back phone  # [phone]  Permission to leave phone message Yes  Some recent data might be hidden     Patient questions:  Do you have a fever, pain , or abdominal swelling? No. Pain Score  0 *  Have you tolerated food without any problems? Yes.    Have you been able to return to your normal activities? Yes.    Do you have any questions about your discharge instructions: Diet   No. Medications  No. Follow up visit  No.  Do you have questions or concerns about your Care? No.  Actions: * If pain score is 4 or above: No action needed, pain <4. 1. Have you developed a fever since your procedure? no  2.   Have you had an respiratory symptoms (SOB or cough) since your procedure? no  3.   Have you tested positive for COVID 19 since your procedure? no  4.   Have you had any family members/close contacts diagnosed with the COVID 19 since your procedure?  no   If yes to any of these questions please route to Joylene John, RN and Alphonsa Gin, Therapist, sports.

## 2019-03-02 ENCOUNTER — Encounter: Payer: Self-pay | Admitting: Cardiology

## 2019-03-02 ENCOUNTER — Ambulatory Visit: Payer: Medicare Other | Admitting: Cardiology

## 2019-03-02 ENCOUNTER — Other Ambulatory Visit: Payer: Self-pay

## 2019-03-02 VITALS — BP 162/78 | HR 90 | Ht 63.0 in | Wt 190.4 lb

## 2019-03-02 DIAGNOSIS — E785 Hyperlipidemia, unspecified: Secondary | ICD-10-CM

## 2019-03-02 DIAGNOSIS — R011 Cardiac murmur, unspecified: Secondary | ICD-10-CM

## 2019-03-02 DIAGNOSIS — I447 Left bundle-branch block, unspecified: Secondary | ICD-10-CM | POA: Diagnosis not present

## 2019-03-02 DIAGNOSIS — E1169 Type 2 diabetes mellitus with other specified complication: Secondary | ICD-10-CM

## 2019-03-02 DIAGNOSIS — R9431 Abnormal electrocardiogram [ECG] [EKG]: Secondary | ICD-10-CM | POA: Diagnosis not present

## 2019-03-02 DIAGNOSIS — I10 Essential (primary) hypertension: Secondary | ICD-10-CM | POA: Diagnosis not present

## 2019-03-02 DIAGNOSIS — Z7189 Other specified counseling: Secondary | ICD-10-CM

## 2019-03-02 DIAGNOSIS — R0789 Other chest pain: Secondary | ICD-10-CM

## 2019-03-02 MED ORDER — AMLODIPINE BESYLATE 5 MG PO TABS: 5.0000 mg | ORAL_TABLET | Freq: Every day | ORAL | 3 refills | Status: DC

## 2019-03-02 NOTE — Progress Notes (Signed)
Cardiology Office Note:    Date:  03/02/2019   ID:  Steele, Bail [birth year], MRN [id number]  PCP:  [name], NP  Cardiologist:  [name], MD  Referring MD: [name], NP   CC: new patient evaluation for abnormal ECG  History of Present Illness:    Tabitha Oneal is a 63 y.o. female with a hx of hypertension, type II diabetes on metformin, mild asthma, anemia, hyperlipidemia not on statin 2/2 elevated LFTs who is seen as a new consult at the request of [name], NP for the evaluation and management of abnormal EKG.  Per notes from 02/22/29, BP had been difficult to control, increased quinipril. She had an ECG done at that visit which noted incomplete LBBB, referred to cardiology for further evaluation.   Her daughter is present via video on the telephone for the visit today.   I reviewed her ECG history today:  02/09/2012 NSR, QRS 23ms (2015 ECG--there is adobe error, will not open) 08/15/2015 NSR, QRS 84 ms (though appears similar to prior on my measurement) 02/23/2019 NSR, QRS 18ms 03/02/2019 NSR, QRS 139ms  Cardiovascular risk factors: Prior clinical ASCVD: none Comorbid conditions: type II diabetes, hypertension  Metabolic syndrome/Obesity: BMI 33 Chronic inflammatory conditions: none that she knows of Tobacco use history: never Family history: daughter has a heart issue, no other family members that she knows of (though possibly her father and her brother).  Prior cardiac testing and/or incidental findings on other testing (ie coronary calcium): echo 2017, carotids 2017 Exercise level: minimal walking as she has a bad knee, doesn't need to climb stairs Current diet: avoids heavy foods.   Endorses slight intermittent central chest pressure. Less than once/week, "hardly ever." Has noted maybe 1-2 times. Lasts a few moments, goes away on its own. Nonextertional. No associated symptoms. No radiation.   Denies  shortness of breath at rest or with normal exertion. No PND, orthopnea, LE edema or unexpected weight gain. No syncope or palpitations.  Past Medical History:  Diagnosis Date  . Asthma    QVAR daily and Albuterol as needed  . Bladder infection    taking Keflex daily   . Chronic back pain    reason unknown  . Diabetes mellitus    takes Metformin and Tradjenta daily  . GERD (gastroesophageal reflux disease)    takes Omeprazole daily  . Hard of hearing   . Headache    daily.Takes Excedrine daily  . Hepatitis C   . History of blood transfusion    no abnormal reaction noted  . Hyperlipidemia    takes Atorvastatin daily  . Hypertension    takes Quinapril daily  . Joint pain   . Joint swelling   . Nocturia   . Obesity   . Osteoarthritis of left knee 02/23/2012  . Seasonal allergies    takes Claritin and Singulair daily.Nasal spray as needed  . Shortness of breath dyspnea    with exertion    Past Surgical History:  Procedure Laterality Date  . ABDOMINAL HYSTERECTOMY    . INJECTION KNEE  04/09/2012   Procedure: KNEE INJECTION;  Surgeon: Johnny Bridge, MD;  Location: [place];  Service: Orthopedics;  Laterality: Left;  . JOINT REPLACEMENT     acl   bil knees  . KNEE ARTHROTOMY Left 08/28/2015   Procedure: LEFT KNEE ARTHROFIBROSIS EXCISION; pylectomy;  Surgeon: Meredith Pel, MD;  Location: [place];  Service: Orthopedics;  Laterality: Left;  .  KNEE CLOSED REDUCTION  04/09/2012   Procedure: CLOSED MANIPULATION KNEE;  Surgeon: Johnny Bridge, MD;  Location: [place];  Service: Orthopedics;  Laterality: Left;  Manipulation Knee with Anesthesia includes Application of Traction   . TOTAL KNEE ARTHROPLASTY  02/23/2012   Procedure: TOTAL KNEE ARTHROPLASTY;  Surgeon: Johnny Bridge, MD;  Location: [place];  Service: Orthopedics;  Laterality: Left;    Current Medications: Current Outpatient Medications on File Prior to Visit  Medication Sig  .  Albuterol Sulfate (PROAIR RESPICLICK) [id number] (90 Base) MCG/ACT AEPB Inhale 4 puffs into the lungs every 4 (four) hours as needed.  Marland Kitchen azelastine (ASTELIN) 0.1 % nasal spray USE 1 SPRAY EACH NOSTRIL TWICE DAILY  . baclofen (LIORESAL) 10 MG tablet TAKE 1 TABLET BY MOUTH THREE TIMES A DAY AS NEEDED FOR MUSCLE SPASM  . benzonatate (TESSALON PERLES) 100 MG capsule Take 1 capsule (100 mg total) by mouth 3 (three) times daily as needed for cough.  . ciprofloxacin (CIPRO) 500 MG tablet Take 1 tablet (500 mg total) by mouth 2 (two) times daily. x7 days  . diclofenac sodium (VOLTAREN) 1 % GEL Apply 8 g topically 2 (two) times daily as needed. On knees  . ferrous sulfate 325 (65 FE) MG tablet TAKE 1 TABLET BY MOUTH EVERY DAY WITH BREAKFAST  . fluticasone (FLONASE) 50 MCG/ACT nasal spray Place 2 sprays into both nostrils daily.  . fluticasone furoate-vilanterol (BREO ELLIPTA) 200-25 MCG/INH AEPB Inhale 1 puff into the lungs daily.  Marland Kitchen ipratropium (ATROVENT) 0.03 % nasal spray Place 2 sprays into both nostrils 2 (two) times daily.  . metFORMIN (GLUCOPHAGE) 500 MG tablet TAKE ONE TABLET BY MOUTH TWICE DAILY TO CONTROL BLOOD SUGAR  . montelukast (SINGULAIR) 10 MG tablet TAKE 1 TABLET BY MOUTH EVERYDAY AT BEDTIME  . omeprazole (PRILOSEC) 40 MG capsule TAKE 1 CAPSULE BY MOUTH EVERY DAY  . ondansetron (ZOFRAN ODT) 8 MG disintegrating tablet Take 1 tablet (8 mg total) by mouth every 8 (eight) hours as needed for nausea or vomiting.  . ONE TOUCH ULTRA TEST test strip Check blood sugar once daily as directed  . polyethylene glycol powder (GLYCOLAX/MIRALAX) 17 GM/SCOOP powder Take 17 g by mouth daily. Hold for loose stool  . quinapril (ACCUPRIL) 40 MG tablet Take 1 tablet (40 mg total) by mouth at bedtime.  . saccharomyces boulardii (FLORASTOR) 250 MG capsule Take 1 capsule (250 mg total) by mouth 2 (two) times daily. x10 days  . tiZANidine (ZANAFLEX) 4 MG tablet Take 1 tablet (4 mg total) by mouth every 6 (six) hours as  needed for muscle spasms. For headaches.  . topiramate (TOPAMAX) 50 MG tablet Start with one pill(50mg ) at bedtime and increase to two pills(100mg ) at bedtime in one week.   No current facility-administered medications on file prior to visit.      Allergies:   Patient has no known allergies.   Social History   Tobacco Use  . Smoking status: Never Smoker  . Smokeless tobacco: Never Used  Substance Use Topics  . Alcohol use: No    Alcohol/week: 0.0 standard drinks  . Drug use: No    Family History: family history includes Alzheimer's disease in her mother; Breast cancer in her sister; Bronchitis (age of onset: 94) in her father; Diabetes in her daughter, daughter and another family member; Headache in her daughter and sister; High blood pressure in her sister, sister, sister, sister, sister, and sister; Ovarian cancer in her mother; Stomach cancer in her  mother. There is no history of Colon cancer, Esophageal cancer, or Rectal cancer.  ROS:   Please see the history of present illness.  Additional pertinent ROS: Constitutional: Negative for chills, fever, night sweats, unintentional weight loss  HENT: Negative for ear pain and hearing loss.   Eyes: Negative for loss of vision and eye pain.  Respiratory: Negative for cough, sputum, wheezing.   Cardiovascular: See HPI. Gastrointestinal: Negative for melena, and hematochezia. Positive for chronic abdominal pain, nausea Genitourinary: Negative for dysuria and hematuria.  Musculoskeletal: Negative for falls and myalgias.  Skin: Negative for itching and rash.  Neurological: Negative for focal weakness, focal sensory changes and loss of consciousness.  Endo/Heme/Allergies: Does not bruise/bleed easily.     EKGs/Labs/Other Studies Reviewed:    The following studies were reviewed today: Echo 12/20/2015 - Left ventricle: The cavity size was normal. Wall thickness was   normal. Systolic function was normal. The estimated ejection    fraction was in the range of 55% to 60%. Indeterminant diastolic   function. Wall motion was normal; there were no regional wall   motion abnormalities. - Aortic valve: There was no stenosis. - Mitral valve: There was trivial regurgitation. - Right ventricle: The cavity size was normal. Systolic function   was normal. - Tricuspid valve: Peak RV-RA gradient (S): 23 mm Hg. - Pulmonary arteries: PA peak pressure: 26 mm Hg (S). - Inferior vena cava: The vessel was normal in size. The   respirophasic diameter changes were in the normal range (= 50%),   consistent with normal central venous pressure.  Impressions:  - Normal LV size with EF 55-60%. Normal RV size and systolic   function. No significant valvular abnormalities.  Carotids 05/06/16 Diffuse plaque with 1-39% bilateral ICA stenosis. Patent vertebral arteries with antegrade flow. Normal subclavian arteries, bilaterally. f/u PRN   EKG:  EKG is personally reviewed.  The ekg ordered today demonstrates NSR, QRS 100 in the pattern of iLBBB, borderline LVH  Recent Labs: 12/13/2018: TSH 0.517 02/23/2019: ALT 111; BUN 6; Creat 0.91; Hemoglobin 10.2; Platelets 275; Potassium 3.8; Sodium 140  Recent Lipid Panel    Component Value Date/Time   CHOL 110 01/24/2019 1121   CHOL 140 11/21/2015 1026   TRIG 168 (H) 01/24/2019 1121   HDL 13 (L) 01/24/2019 1121   HDL 55 11/21/2015 1026   CHOLHDL 8.5 (H) 01/24/2019 1121   VLDL 11 02/20/2016 1000   LDLCALC 72 01/24/2019 1121    Physical Exam:    VS:  BP (!) 162/78   Pulse 90   Ht 5\' 3"  (1.6 m)   Wt 190 lb 6.4 oz (86.4 kg)   SpO2 97%   BMI 33.73 kg/m     Wt Readings from Last 3 Encounters:  03/02/19 190 lb 6.4 oz (86.4 kg)  02/25/19 189 lb (85.7 kg)  02/24/19 189 lb 8 oz (86 kg)    GEN: Well nourished, well developed in no acute distress HEENT: Normal, moist mucous membranes NECK: No JVD CARDIAC: regular rhythm, normal S1 and S2, no rubs, gallops. 2/6 systolic murmur at R and  LSB VASCULAR: Radial and DP pulses 2+ bilaterally. No carotid bruits RESPIRATORY:  Clear to auscultation without rales, wheezing or rhonchi  ABDOMEN: Soft, non-tender, non-distended MUSCULOSKELETAL:  Ambulates independently with cane, left knee in brace SKIN: Warm and dry, no edema NEUROLOGIC:  Alert and oriented x 3. No focal neuro deficits noted. PSYCHIATRIC:  Normal affect    ASSESSMENT:    1. Abnormal EKG  2. Incomplete left bundle branch block (LBBB)   3. Essential hypertension, benign   4. Hyperlipidemia due to type 2 diabetes mellitus ([place])   5. Murmur, cardiac   6. Cardiac risk counseling   7. Counseling on health promotion and disease prevention   8. Other chest pain    PLAN:    Abnormal EKG: pattern of iLBBB. Seen previously, with only slight change since 2013 -prior echo WNL -no syncope or high risk features -no further evaluation  Hypertension: repeat 162/78 -recently increased on her ACEi -will start amlodipine 5 mg, counseled on what to watch for in terms of symptoms -has follow up scheduled with her PCP 11/4 -counseled on home blood pressures  Hyperlipidemia due to type II diabetes -LFTs elevated on statins previously -lipids 01/24/19 (on atorvastatin) below  Murmur:  -echo without significant valve abnormalities -no high risk features  Fleeting, rare chest pressure: atypical, brief, nonexertional, rare -counseled on red flag warning signs that need immediate medical attention -will if this becomes more frequent  Cardiac risk counseling and prevention recommendations: -recommend heart healthy/Mediterranean diet, with whole grains, fruits, vegetable, fish, lean meats, nuts, and olive oil. Limit salt. -recommend moderate walking, 3-5 times/week for 30-50 minutes each session. Aim for at least 150 minutes.week. Goal should be pace of 3 miles/hours, or walking 1.5 miles in 30 minutes -recommend avoidance of tobacco products. Avoid excess alcohol.  -Additional risk factor control:  -Diabetes: A1c is  -Lipids: 01/24/19 on atorvastatin: Tchol 110, HDL 13, LDL 72, TG 168.  -Blood pressure control: as above  -Weight: BMI 33 -ASCVD risk score: The ASCVD Risk score Mikey Bussing DC Jr., et al., 2013) failed to calculate for the following reasons:   The valid HDL cholesterol range is 20 to 100 mg/dL   The valid total cholesterol range is 130 to 320 mg/dL    Plan for follow up: 1 year or sooner PRN  Medication Adjustments/Labs and Tests Ordered: Current medicines are reviewed at length with the patient today.  Concerns regarding medicines are outlined above.  Orders Placed This Encounter  Procedures  . EKG 12-Lead   Meds ordered this encounter  Medications  . amLODipine (NORVASC) 5 MG tablet    Sig: Take 1 tablet (5 mg total) by mouth daily.    Dispense:  90 tablet    Refill:  3    Patient Instructions  Medication Instructions:  Start: Amlodipine 5 mg daily  If you need a refill on your cardiac medications before your next appointment, please call your pharmacy.   Lab work: None  Testing/Procedures: None  Follow-Up: At [facility], you and your health needs are our priority.  As part of our continuing mission to provide you with exceptional heart care, we have created designated Provider Care Teams.  These Care Teams include your primary Cardiologist (physician) and Advanced Practice Providers (APPs -  Physician Assistants and Nurse Practitioners) who all work together to provide you with the care you need, when you need it. You will need a follow up appointment in 1 years.  Please call our office 2 months in advance to schedule this appointment.  You may see Dr. Harrell Gave or one of the following Advanced Practice Providers on your designated Care Team:   Rosaria Ferries, PA-C . Jory Sims, DNP, ANP      Signed, [name], MD PhD 03/02/2019 12:50 PM    [place]

## 2019-03-02 NOTE — Patient Instructions (Addendum)
Medication Instructions:  Start: Amlodipine 5 mg daily  If you need a refill on your cardiac medications before your next appointment, please call your pharmacy.   Lab work: None  Testing/Procedures: None  Follow-Up: At [facility], you and your health needs are our priority.  As part of our continuing mission to provide you with exceptional heart care, we have created designated Provider Care Teams.  These Care Teams include your primary Cardiologist (physician) and Advanced Practice Providers (APPs -  Physician Assistants and Nurse Practitioners) who all work together to provide you with the care you need, when you need it. You will need a follow up appointment in 1 years.  Please call our office 2 months in advance to schedule this appointment.  You may see Dr. Harrell Gave or one of the following Advanced Practice Providers on your designated Care Team:   Rosaria Ferries, PA-C . Jory Sims, DNP, ANP

## 2019-03-06 ENCOUNTER — Other Ambulatory Visit: Payer: Self-pay | Admitting: Neurology

## 2019-03-08 ENCOUNTER — Encounter: Payer: Self-pay | Admitting: Gastroenterology

## 2019-03-09 ENCOUNTER — Other Ambulatory Visit: Payer: Self-pay | Admitting: Nurse Practitioner

## 2019-03-10 LAB — HM DIABETES EYE EXAM

## 2019-03-23 ENCOUNTER — Other Ambulatory Visit: Payer: Self-pay | Admitting: Nurse Practitioner

## 2019-03-30 ENCOUNTER — Ambulatory Visit (INDEPENDENT_AMBULATORY_CARE_PROVIDER_SITE_OTHER): Payer: Medicare Other | Admitting: Nurse Practitioner

## 2019-03-30 ENCOUNTER — Other Ambulatory Visit: Payer: Self-pay

## 2019-03-30 ENCOUNTER — Encounter: Payer: Self-pay | Admitting: Nurse Practitioner

## 2019-03-30 VITALS — BP 150/80 | HR 98 | Temp 98.3°F | Resp 20 | Ht 63.0 in | Wt 187.4 lb

## 2019-03-30 DIAGNOSIS — I10 Essential (primary) hypertension: Secondary | ICD-10-CM | POA: Diagnosis not present

## 2019-03-30 DIAGNOSIS — R7989 Other specified abnormal findings of blood chemistry: Secondary | ICD-10-CM

## 2019-03-30 DIAGNOSIS — D509 Iron deficiency anemia, unspecified: Secondary | ICD-10-CM

## 2019-03-30 DIAGNOSIS — E119 Type 2 diabetes mellitus without complications: Secondary | ICD-10-CM | POA: Diagnosis not present

## 2019-03-30 DIAGNOSIS — E785 Hyperlipidemia, unspecified: Secondary | ICD-10-CM

## 2019-03-30 DIAGNOSIS — R110 Nausea: Secondary | ICD-10-CM

## 2019-03-30 LAB — LIPID PANEL
Cholesterol: 255 mg/dL — ABNORMAL HIGH (ref <200)
HDL: 50 mg/dL (ref >=50)
LDL Cholesterol (Calc): 183 mg/dL (calc) — ABNORMAL HIGH
Non-HDL Cholesterol (Calc): 205 mg/dL (calc) — ABNORMAL HIGH (ref <130)
Total CHOL/HDL Ratio: 5.1 (calc) — ABNORMAL HIGH (ref <5.0)
Triglycerides: 98 mg/dL (ref <150)

## 2019-03-30 LAB — CBC WITH DIFFERENTIAL/PLATELET
Absolute Monocytes: 330 cells/uL (ref 200–950)
Basophils Absolute: 83 cells/uL (ref 0–200)
Basophils Relative: 1.4 %
Eosinophils Absolute: 389 cells/uL (ref 15–500)
Eosinophils Relative: 6.6 %
HCT: 35.5 % (ref 35.0–45.0)
Hemoglobin: 11.5 g/dL — ABNORMAL LOW (ref 11.7–15.5)
Lymphs Abs: 1758 cells/uL (ref 850–3900)
MCH: 27 pg (ref 27.0–33.0)
MCHC: 32.4 g/dL (ref 32.0–36.0)
MCV: 83.3 fL (ref 80.0–100.0)
MPV: 12 fL (ref 7.5–12.5)
Monocytes Relative: 5.6 %
Neutro Abs: 3339 cells/uL (ref 1500–7800)
Neutrophils Relative %: 56.6 %
Platelets: 298 10*3/uL (ref 140–400)
RBC: 4.26 10*6/uL (ref 3.80–5.10)
RDW: 15.7 % — ABNORMAL HIGH (ref 11.0–15.0)
Total Lymphocyte: 29.8 %
WBC: 5.9 10*3/uL (ref 3.8–10.8)

## 2019-03-30 LAB — COMPLETE METABOLIC PANEL WITH GFR
AG Ratio: 1.1 (calc) (ref 1.0–2.5)
ALT: 28 U/L (ref 6–29)
AST: 35 U/L (ref 10–35)
Albumin: 4.1 g/dL (ref 3.6–5.1)
Alkaline phosphatase (APISO): 114 U/L (ref 37–153)
BUN: 10 mg/dL (ref 7–25)
CO2: 23 mmol/L (ref 20–32)
Calcium: 10.1 mg/dL (ref 8.6–10.4)
Chloride: 108 mmol/L (ref 98–110)
Creat: 0.75 mg/dL (ref 0.50–0.99)
GFR, Est African American: 98 mL/min/{1.73_m2} (ref >=60)
GFR, Est Non African American: 84 mL/min/{1.73_m2} (ref >=60)
Globulin: 3.7 g/dL (calc) (ref 1.9–3.7)
Glucose, Bld: 116 mg/dL — ABNORMAL HIGH (ref 65–99)
Potassium: 3.7 mmol/L (ref 3.5–5.3)
Sodium: 141 mmol/L (ref 135–146)
Total Bilirubin: 0.8 mg/dL (ref 0.2–1.2)
Total Protein: 7.8 g/dL (ref 6.1–8.1)

## 2019-03-30 NOTE — Progress Notes (Signed)
Careteam: Patient Care Team: [name], NP as PCP - General (Geriatric Medicine) [name], MD as PCP - Cardiology (Cardiology) [name], MD as Consulting Physician (Neurology) [name], MD as Consulting Physician (Ophthalmology)  Advanced Directive information    No Known Allergies  Chief Complaint  Patient presents with  . Follow-up    6 Weeks Follow Up     HPI: Patient is a 63 y.o. female seen in the office today for 6 week follow up. HPI/ROS difficult to obtain very inconsistent with hx and she is here today alone.   Hypertension- 150/80 today, 148/70 recheck. Taking 10 mg norvasc in AM and 40 mg accupril at bedtime. Reports some increased salt intake recently, says she knows she needs to cut back. Blood pressure running 123456 systolic at home.  T2DM- Taking metformin 500 mg twice daily, not checking blood sugar at home. States she has had increase in sweets in her diet. Off tradjenta due to elevated liver enzyme.  Hyperlipidemia- Trying to make diet modifications as able, unable to exercise due to chronic knee pain. Off statin due to elevated liver enzymes  Anemia- Taking iron supplement daily last hgb 10.2  Nausea and vomiting- No vomiting recently, nausea mild- not daily, no abd pain. Endoscopy (02/25/19) showed mild gastritis.   Weight loss- Weight 187 today, lost 28 pounds since 12/13/18. Decreased appetite, tries to eat foods she liked previously but gets nauseous. Usually just eats breakfast and dinner, "enough to get by."  Cataracts- Had sx on L eye, f/u with eye doctor for R eye 03/31/19   Review of Systems:  Review of Systems  Constitutional: Positive for weight loss. Negative for chills, fever and malaise/fatigue.  HENT: Negative for hearing loss.   Eyes:       Cataracts  Respiratory: Negative for cough, shortness of breath and wheezing.   Cardiovascular: Negative for chest pain, palpitations and leg swelling.   Gastrointestinal: Positive for nausea. Negative for abdominal pain, constipation, diarrhea and vomiting.  Genitourinary: Negative.   Musculoskeletal: Positive for joint pain (chronic knee pain).  Skin: Negative.   Neurological: Positive for headaches (migraines- controlled). Negative for dizziness.  Endo/Heme/Allergies: Bruises/bleeds easily.    Past Medical History:  Diagnosis Date  . Asthma    QVAR daily and Albuterol as needed  . Bladder infection    taking Keflex daily   . Chronic back pain    reason unknown  . Diabetes mellitus    takes Metformin and Tradjenta daily  . GERD (gastroesophageal reflux disease)    takes Omeprazole daily  . Hard of hearing   . Headache    daily.Takes Excedrine daily  . Hepatitis C   . History of blood transfusion    no abnormal reaction noted  . Hyperlipidemia    takes Atorvastatin daily  . Hypertension    takes Quinapril daily  . Joint pain   . Joint swelling   . Nocturia   . Obesity   . Osteoarthritis of left knee 02/23/2012  . Seasonal allergies    takes Claritin and Singulair daily.Nasal spray as needed  . Shortness of breath dyspnea    with exertion   Past Surgical History:  Procedure Laterality Date  . ABDOMINAL HYSTERECTOMY    . INJECTION KNEE  04/09/2012   Procedure: KNEE INJECTION;  Surgeon: Johnny Bridge, MD;  Location: [facility];  Service: Orthopedics;  Laterality: Left;  . JOINT REPLACEMENT     acl   bil  knees  . KNEE ARTHROTOMY Left 08/28/2015   Procedure: LEFT KNEE ARTHROFIBROSIS EXCISION; pylectomy;  Surgeon: Meredith Pel, MD;  Location: [place];  Service: Orthopedics;  Laterality: Left;  . KNEE CLOSED REDUCTION  04/09/2012   Procedure: CLOSED MANIPULATION KNEE;  Surgeon: Johnny Bridge, MD;  Location: [place];  Service: Orthopedics;  Laterality: Left;  Manipulation Knee with Anesthesia includes Application of Traction   . TOTAL KNEE ARTHROPLASTY  02/23/2012   Procedure: TOTAL  KNEE ARTHROPLASTY;  Surgeon: Johnny Bridge, MD;  Location: [place];  Service: Orthopedics;  Laterality: Left;   Social History:   reports that she has never smoked. She has never used smokeless tobacco. She reports that she does not drink alcohol or use drugs.  Family History  Problem Relation Age of Onset  . Bronchitis Father 32  . Alzheimer's disease Mother   . Ovarian cancer Mother   . Stomach cancer Mother   . High blood pressure Sister   . High blood pressure Sister   . Headache Sister        pt thinks   . High blood pressure Sister   . High blood pressure Sister        pt thinks  . Breast cancer Sister   . High blood pressure Sister   . High blood pressure Sister   . Headache Daughter   . Diabetes Daughter   . Diabetes Daughter   . Diabetes Other   . Colon cancer Neg Hx   . Esophageal cancer Neg Hx   . Rectal cancer Neg Hx     Medications: Patient's Medications  New Prescriptions   No medications on file  Previous Medications   ALBUTEROL SULFATE (PROAIR RESPICLICK) [id number] (90 BASE) MCG/ACT AEPB    Inhale 4 puffs into the lungs every 4 (four) hours as needed.   AMLODIPINE (NORVASC) 5 MG TABLET    Take 10 mg by mouth daily.   AZELASTINE (ASTELIN) 0.1 % NASAL SPRAY    USE 1 SPRAY EACH NOSTRIL TWICE DAILY   BACLOFEN (LIORESAL) 10 MG TABLET    TAKE 1 TABLET BY MOUTH THREE TIMES A DAY AS NEEDED FOR MUSCLE SPASM   BENZONATATE (TESSALON PERLES) 100 MG CAPSULE    Take 1 capsule (100 mg total) by mouth 3 (three) times daily as needed for cough.   DICLOFENAC SODIUM (VOLTAREN) 1 % GEL    Apply 8 g topically 2 (two) times daily as needed. On knees   FERROUS SULFATE 325 (65 FE) MG TABLET    TAKE 1 TABLET BY MOUTH EVERY DAY WITH BREAKFAST   FLUTICASONE (FLONASE) 50 MCG/ACT NASAL SPRAY    Place 2 sprays into both nostrils daily.   FLUTICASONE FUROATE-VILANTEROL (BREO ELLIPTA) 200-25 MCG/INH AEPB    Inhale 1 puff into the lungs daily.   IPRATROPIUM (ATROVENT) 0.03 % NASAL SPRAY    Place  2 sprays into both nostrils 2 (two) times daily.   METFORMIN (GLUCOPHAGE) 500 MG TABLET    TAKE ONE TABLET BY MOUTH TWICE DAILY TO CONTROL BLOOD SUGAR   MONTELUKAST (SINGULAIR) 10 MG TABLET    TAKE 1 TABLET BY MOUTH EVERYDAY AT BEDTIME   OMEPRAZOLE (PRILOSEC) 40 MG CAPSULE    TAKE 1 CAPSULE BY MOUTH EVERY DAY   ONE TOUCH ULTRA TEST TEST STRIP    Check blood sugar once daily as directed   QUINAPRIL (ACCUPRIL) 40 MG TABLET    Take 1 tablet (40 mg total) by mouth at bedtime.  TIZANIDINE (ZANAFLEX) 4 MG TABLET    Take 1 tablet (4 mg total) by mouth every 6 (six) hours as needed for muscle spasms. For headaches.   TOPIRAMATE (TOPAMAX) 50 MG TABLET    START WITH ONE PILL(50MG ) AT BEDTIME AND INCREASE TO TWO PILLS(100MG ) AT BEDTIME IN ONE WEEK.  Modified Medications   No medications on file  Discontinued Medications   AMLODIPINE (NORVASC) 5 MG TABLET    Take 1 tablet (5 mg total) by mouth daily.   CIPROFLOXACIN (CIPRO) 500 MG TABLET    Take 1 tablet (500 mg total) by mouth 2 (two) times daily. x7 days   SACCHAROMYCES BOULARDII (FLORASTOR) 250 MG CAPSULE    Take 1 capsule (250 mg total) by mouth 2 (two) times daily. x10 days    Physical Exam:  Vitals:   03/30/19 1015  BP: (!) 150/80  Pulse: 98  Resp: 20  Temp: 98.3 F (36.8 C)  TempSrc: Oral  SpO2: 98%  Weight: 187 lb 6.4 oz (85 kg)  Height: 5\' 3"  (1.6 m)   Body mass index is 33.2 kg/m. Wt Readings from Last 3 Encounters:  03/30/19 187 lb 6.4 oz (85 kg)  03/02/19 190 lb 6.4 oz (86.4 kg)  02/25/19 189 lb (85.7 kg)    Physical Exam Constitutional:      Appearance: Normal appearance. She is obese.  HENT:     Head: Normocephalic.     Right Ear: Decreased hearing noted.     Left Ear: Decreased hearing noted.  Eyes:     General: Vision grossly intact.  Neck:     Musculoskeletal: Normal range of motion and neck supple.     Thyroid: No thyromegaly.  Cardiovascular:     Rate and Rhythm: Normal rate and regular rhythm.     Pulses:  Normal pulses.     Heart sounds: S1 normal and S2 normal. Murmur present.  Pulmonary:     Effort: Pulmonary effort is normal.     Breath sounds: Normal breath sounds.  Abdominal:     General: Bowel sounds are normal.     Palpations: Abdomen is soft.     Tenderness: There is no abdominal tenderness.  Musculoskeletal:     Left knee: She exhibits decreased range of motion (brace).     Right lower leg: No edema.     Left lower leg: No edema.  Skin:    General: Skin is warm and dry.     Capillary Refill: Capillary refill takes less than 2 seconds.  Neurological:     Mental Status: She is alert and oriented to person, place, and time.     Gait: Gait abnormal (brace, cane).  Psychiatric:        Mood and Affect: Mood and affect normal.        Behavior: Behavior is cooperative.    Labs reviewed: Basic Metabolic Panel: Recent Labs    12/13/18 1431  02/08/19 1752 02/11/19 1009 02/23/19 1152  NA 145*   < > 143 141 140  K 3.9   < > 3.5 3.8 3.8  CL 105   < > 110 108 106  CO2 19*   < > 24 22 26   GLUCOSE 113*   < > 123* 128* 113*  BUN 10   < > 18 13 6*  CREATININE 0.78   < > 1.08* 1.12* 0.91  CALCIUM 10.0   < > 9.8 9.6 10.0  TSH 0.517  --   --   --   --    < > =  values in this interval not displayed.   Liver Function Tests: Recent Labs    02/08/19 1752 02/11/19 1009 02/23/19 1152  AST 510* 310* 76*  ALT 503* 326* 111*  ALKPHOS 176*  --   --   BILITOT 3.2* 2.1* 2.8*  PROT 8.8* 7.3 7.3  ALBUMIN 3.8  --   --    Recent Labs    01/24/19 1121 02/08/19 1752  LIPASE 19 32  AMYLASE 28  --    No results for input(s): AMMONIA in the last 8760 hours. CBC: Recent Labs    01/24/19 1121 01/28/19 0932 02/08/19 1752 02/23/19 1152  WBC 7.6 9.7 9.9 6.1  NEUTROABS 3,823 4,093  --  3,678  HGB 9.9* 9.6* 11.1* 10.2*  HCT 29.9* 30.3* 34.4* 32.5*  MCV 77.9* 81.5 79.3* 81.5  PLT 308 415* 314 275   Lipid Panel: Recent Labs    01/24/19 1121  CHOL 110  HDL 13*  LDLCALC 72   TRIG 168*  CHOLHDL 8.5*   TSH: Recent Labs    12/13/18 1431  TSH 0.517   A1C: Lab Results  Component Value Date   HGBA1C 6.9 (H) 01/24/2019    Assessment/Plan 1. Essential hypertension, benign -Continue taking quinapril 40 mg daily, norvasc 10 mg daily -Discussed reducing salt intake- not adding salt to food, lower sodium options -Check BP at home, given BP log to bring to next visit -F/u in 4 weeks, adjust meds if BP remains elevated  2. Type 2 diabetes mellitus without complication, without long-term current use of insulin (HCC) -Continue metformin 500 mg twice daily- take with food -Instructed to check blood sugar at home and bring readings to next visit -Recheck A1c at next visit   3. Hyperlipidemia LDL goal <100 -Discussed dash diet, dietary modifications  -Not taking statin due to elevated liver enzymes, will follow up lab today - Lipid Panel  4. Elevated LFTs -Continues to have nausea at times, abd pain improved.  -LFTs have been trending down, recheck today - COMPLETE METABOLIC PANEL WITH GFR  5. Iron deficiency anemia, unspecified iron deficiency anemia type -Continue taking daily iron supplement - CBC with Differential/Platelet  6. Nausea -Nausea has improved, mild -Discussed adequate fluid intake -continues on omeprazole.   Next appt: 4 weeks for routine follow up, blood pressure check and A1c  Paige Vanderwoude K. Harle Battiest I personally was present during the history, physical exam and medical decision-making activities of this service and have verified that the service and findings are accurately documented in the student's note  [facility] [phone]

## 2019-03-30 NOTE — Patient Instructions (Addendum)
Make sure you are taking norvasc 10 mg daily and Quinapril 40 mg at bedtime.   Blood pressure should be <140/90, take blood pressure LOG and bring it to follow up in 4 weeks.  Avoid frozen meals, boxed meal, canned goods- all high in sodium DO NOT ADD salt    DASH Eating Plan DASH stands for "Dietary Approaches to Stop Hypertension." The DASH eating plan is a healthy eating plan that has been shown to reduce high blood pressure (hypertension). It may also reduce your risk for type 2 diabetes, heart disease, and stroke. The DASH eating plan may also help with weight loss. What are tips for following this plan?  General guidelines  Avoid eating more than 2,300 mg (milligrams) of salt (sodium) a day. If you have hypertension, you may need to reduce your sodium intake to 1,500 mg a day.  Limit alcohol intake to no more than 1 drink a day for nonpregnant women and 2 drinks a day for men. One drink equals 12 oz of beer, 5 oz of wine, or 1 oz of hard liquor.  Work with your health care provider to maintain a healthy body weight or to lose weight. Ask what an ideal weight is for you.  Get at least 30 minutes of exercise that causes your heart to beat faster (aerobic exercise) most days of the week. Activities may include walking, swimming, or biking.  Work with your health care provider or diet and nutrition specialist (dietitian) to adjust your eating plan to your individual calorie needs. Reading food labels   Check food labels for the amount of sodium per serving. Choose foods with less than 5 percent of the Daily Value of sodium. Generally, foods with less than 300 mg of sodium per serving fit into this eating plan.  To find whole grains, look for the word "whole" as the first word in the ingredient list. Shopping  Buy products labeled as "low-sodium" or "no salt added."  Buy fresh foods. Avoid canned foods and premade or frozen meals. Cooking  Avoid adding salt when cooking. Use  salt-free seasonings or herbs instead of table salt or sea salt. Check with your health care provider or pharmacist before using salt substitutes.  Do not fry foods. Cook foods using healthy methods such as baking, boiling, grilling, and broiling instead.  Cook with heart-healthy oils, such as olive, canola, soybean, or sunflower oil. Meal planning  Eat a balanced diet that includes: ? 5 or more servings of fruits and vegetables each day. At each meal, try to fill half of your plate with fruits and vegetables. ? Up to 6-8 servings of whole grains each day. ? Less than 6 oz of lean meat, poultry, or fish each day. A 3-oz serving of meat is about the same size as a deck of cards. One egg equals 1 oz. ? 2 servings of low-fat dairy each day. ? A serving of nuts, seeds, or beans 5 times each week. ? Heart-healthy fats. Healthy fats called Omega-3 fatty acids are found in foods such as flaxseeds and coldwater fish, like sardines, salmon, and mackerel.  Limit how much you eat of the following: ? Canned or prepackaged foods. ? Food that is high in trans fat, such as fried foods. ? Food that is high in saturated fat, such as fatty meat. ? Sweets, desserts, sugary drinks, and other foods with added sugar. ? Full-fat dairy products.  Do not salt foods before eating.  Try to eat at least 2  vegetarian meals each week.  Eat more home-cooked food and less restaurant, buffet, and fast food.  When eating at a restaurant, ask that your food be prepared with less salt or no salt, if possible. What foods are recommended? The items listed may not be a complete list. Talk with your dietitian about what dietary choices are best for you. Grains Whole-grain or whole-wheat bread. Whole-grain or whole-wheat pasta. Brown rice. [place]. Bulgur. Whole-grain and low-sodium cereals. Pita bread. Low-fat, low-sodium crackers. Whole-wheat flour tortillas. Vegetables Fresh or frozen vegetables (raw, steamed,  roasted, or grilled). Low-sodium or reduced-sodium tomato and vegetable juice. Low-sodium or reduced-sodium tomato sauce and tomato paste. Low-sodium or reduced-sodium canned vegetables. Fruits All fresh, dried, or frozen fruit. Canned fruit in natural juice (without added sugar). Meat and other protein foods Skinless chicken or [place]. Ground chicken or [place]. Pork with fat trimmed off. Fish and seafood. Egg whites. Dried beans, peas, or lentils. Unsalted nuts, nut butters, and seeds. Unsalted canned beans. Lean cuts of beef with fat trimmed off. Low-sodium, lean deli meat. Dairy Low-fat (1%) or fat-free (skim) milk. Fat-free, low-fat, or reduced-fat cheeses. Nonfat, low-sodium ricotta or cottage cheese. Low-fat or nonfat yogurt. Low-fat, low-sodium cheese. Fats and oils Soft margarine without trans fats. Vegetable oil. Low-fat, reduced-fat, or light mayonnaise and salad dressings (reduced-sodium). Canola, safflower, olive, soybean, and sunflower oils. Avocado. Seasoning and other foods Herbs. Spices. Seasoning mixes without salt. Unsalted popcorn and pretzels. Fat-free sweets. What foods are not recommended? The items listed may not be a complete list. Talk with your dietitian about what dietary choices are best for you. Grains Baked goods made with fat, such as croissants, muffins, or some breads. Dry pasta or rice meal packs. Vegetables Creamed or fried vegetables. Vegetables in a cheese sauce. Regular canned vegetables (not low-sodium or reduced-sodium). Regular canned tomato sauce and paste (not low-sodium or reduced-sodium). Regular tomato and vegetable juice (not low-sodium or reduced-sodium). Angie Fava. Olives. Fruits Canned fruit in a light or heavy syrup. Fried fruit. Fruit in cream or butter sauce. Meat and other protein foods Fatty cuts of meat. Ribs. Fried meat. Berniece Salines. Sausage. Bologna and other processed lunch meats. Salami. Fatback. Hotdogs. Bratwurst. Salted nuts and seeds. Canned  beans with added salt. Canned or smoked fish. Whole eggs or egg yolks. Chicken or [place] with skin. Dairy Whole or 2% milk, cream, and half-and-half. Whole or full-fat cream cheese. Whole-fat or sweetened yogurt. Full-fat cheese. Nondairy creamers. Whipped toppings. Processed cheese and cheese spreads. Fats and oils Butter. Stick margarine. Lard. Shortening. Ghee. Bacon fat. Tropical oils, such as coconut, palm kernel, or palm oil. Seasoning and other foods Salted popcorn and pretzels. Onion salt, garlic salt, seasoned salt, table salt, and sea salt. Worcestershire sauce. Tartar sauce. Barbecue sauce. Teriyaki sauce. Soy sauce, including reduced-sodium. Steak sauce. Canned and packaged gravies. Fish sauce. Oyster sauce. Cocktail sauce. Horseradish that you find on the shelf. Ketchup. Mustard. Meat flavorings and tenderizers. Bouillon cubes. Hot sauce and Tabasco sauce. Premade or packaged marinades. Premade or packaged taco seasonings. Relishes. Regular salad dressings. Where to find more information:  National Heart, Lung, and [place]: https://wilson-eaton.com/  American Heart Association: www.heart.org Summary  The DASH eating plan is a healthy eating plan that has been shown to reduce high blood pressure (hypertension). It may also reduce your risk for type 2 diabetes, heart disease, and stroke.  With the DASH eating plan, you should limit salt (sodium) intake to 2,300 mg a day. If you have hypertension, you may need  to reduce your sodium intake to 1,500 mg a day.  When on the DASH eating plan, aim to eat more fresh fruits and vegetables, whole grains, lean proteins, low-fat dairy, and heart-healthy fats.  Work with your health care provider or diet and nutrition specialist (dietitian) to adjust your eating plan to your individual calorie needs. This information is not intended to replace advice given to you by your health care provider. Make sure you discuss any questions you have with your  health care provider. Document Released: 05/01/2011 Document Revised: 04/24/2017 Document Reviewed: 05/05/2016 Elsevier Patient Education  2020 [employer].

## 2019-03-31 ENCOUNTER — Other Ambulatory Visit: Payer: Self-pay | Admitting: Nurse Practitioner

## 2019-03-31 DIAGNOSIS — E785 Hyperlipidemia, unspecified: Secondary | ICD-10-CM

## 2019-03-31 MED ORDER — ROSUVASTATIN CALCIUM 5 MG PO TABS: 5.0000 mg | ORAL_TABLET | Freq: Every day | ORAL | 1 refills | Status: DC

## 2019-04-22 ENCOUNTER — Other Ambulatory Visit: Payer: Self-pay | Admitting: Nurse Practitioner

## 2019-04-22 DIAGNOSIS — E785 Hyperlipidemia, unspecified: Secondary | ICD-10-CM

## 2019-05-11 ENCOUNTER — Ambulatory Visit: Payer: Medicare Other | Admitting: Nurse Practitioner

## 2019-05-12 ENCOUNTER — Other Ambulatory Visit: Payer: Self-pay | Admitting: Allergy & Immunology

## 2019-05-17 ENCOUNTER — Other Ambulatory Visit: Payer: Self-pay | Admitting: Nurse Practitioner

## 2019-05-18 ENCOUNTER — Encounter: Payer: Self-pay | Admitting: Nurse Practitioner

## 2019-05-18 ENCOUNTER — Other Ambulatory Visit: Payer: Self-pay

## 2019-05-18 ENCOUNTER — Ambulatory Visit (INDEPENDENT_AMBULATORY_CARE_PROVIDER_SITE_OTHER): Payer: Medicare Other | Admitting: Nurse Practitioner

## 2019-05-18 VITALS — BP 132/64 | HR 77 | Temp 97.1°F | Ht 63.0 in | Wt 196.0 lb

## 2019-05-18 DIAGNOSIS — I10 Essential (primary) hypertension: Secondary | ICD-10-CM

## 2019-05-18 DIAGNOSIS — E119 Type 2 diabetes mellitus without complications: Secondary | ICD-10-CM | POA: Diagnosis not present

## 2019-05-18 DIAGNOSIS — R7989 Other specified abnormal findings of blood chemistry: Secondary | ICD-10-CM

## 2019-05-18 DIAGNOSIS — E785 Hyperlipidemia, unspecified: Secondary | ICD-10-CM

## 2019-05-18 NOTE — Progress Notes (Signed)
Careteam: Patient Care Team: [name], NP as PCP - General (Geriatric Medicine) [name], MD as PCP - Cardiology (Cardiology) [name], MD as Consulting Physician (Neurology) [name], MD as Consulting Physician (Ophthalmology)  Advanced Directive information Does Patient Have a Medical Advance Directive?: No, Would patient like information on creating a medical advance directive?: No - Patient declined  No Known Allergies  Chief Complaint  Patient presents with  . Medical Management of Chronic Issues    4 week follow up on blood sugar and liver enzymes. MMSE 30/30    . Medication Management    Patient states she has not taken metformin and would like to know if she should still be taking      HPI: Patient is a 63 y.o. female seen in the office today for follow up on nausea, cholesterol and diabetes.   Pt was having ongoing nausea so metformin was stopped and nausea has resolved. States she was taking on an empty stomach when she was taking. Blood sugars in November were 107-155,  141 last night.   Pt with significantly elevated liver enzymes therefore statin and Tradjenta was stopped, on recheck LFT trended down and back to normal range but LDL significantly elevated, crestor started and here today for recheck on LFTs.   Hypertension- blood pressure well controlled at home 114-138/65-75 on norvasc 10 mg and quinapril 40 mg daily  Reports that overall she is feeling much better.   Review of Systems:  Review of Systems  Constitutional: Negative for chills, fever and weight loss.  HENT: Negative for tinnitus.   Respiratory: Negative for cough, sputum production and shortness of breath.   Cardiovascular: Negative for chest pain, palpitations and leg swelling.  Gastrointestinal: Negative for abdominal pain, constipation, diarrhea and heartburn.  Genitourinary: Negative for dysuria, frequency and urgency.  Musculoskeletal: Negative for  back pain, falls, joint pain and myalgias.  Skin: Negative.   Neurological: Negative for dizziness and headaches.    Past Medical History:  Diagnosis Date  . Asthma    QVAR daily and Albuterol as needed  . Bladder infection    taking Keflex daily   . Chronic back pain    reason unknown  . Diabetes mellitus    takes Metformin and Tradjenta daily  . GERD (gastroesophageal reflux disease)    takes Omeprazole daily  . Hard of hearing   . Headache    daily.Takes Excedrine daily  . Hepatitis C   . History of blood transfusion    no abnormal reaction noted  . Hyperlipidemia    takes Atorvastatin daily  . Hypertension    takes Quinapril daily  . Joint pain   . Joint swelling   . Nocturia   . Obesity   . Osteoarthritis of left knee 02/23/2012  . Seasonal allergies    takes Claritin and Singulair daily.Nasal spray as needed  . Shortness of breath dyspnea    with exertion   Past Surgical History:  Procedure Laterality Date  . ABDOMINAL HYSTERECTOMY    . INJECTION KNEE  04/09/2012   Procedure: KNEE INJECTION;  Surgeon: Johnny Bridge, MD;  Location: [facility];  Service: Orthopedics;  Laterality: Left;  . JOINT REPLACEMENT     acl   bil knees  . KNEE ARTHROTOMY Left 08/28/2015   Procedure: LEFT KNEE ARTHROFIBROSIS EXCISION; pylectomy;  Surgeon: Meredith Pel, MD;  Location: [facility] [place];  Service: Orthopedics;  Laterality: Left;  . KNEE CLOSED REDUCTION  04/09/2012   Procedure: CLOSED MANIPULATION KNEE;  Surgeon: Johnny Bridge, MD;  Location: [place];  Service: Orthopedics;  Laterality: Left;  Manipulation Knee with Anesthesia includes Application of Traction   . TOTAL KNEE ARTHROPLASTY  02/23/2012   Procedure: TOTAL KNEE ARTHROPLASTY;  Surgeon: Johnny Bridge, MD;  Location: [place];  Service: Orthopedics;  Laterality: Left;   Social History:   reports that she has never smoked. She has never used smokeless tobacco. She reports that she does not  drink alcohol or use drugs.  Family History  Problem Relation Age of Onset  . Bronchitis Father 15  . Alzheimer's disease Mother   . Ovarian cancer Mother   . Stomach cancer Mother   . High blood pressure Sister   . High blood pressure Sister   . Headache Sister        pt thinks   . High blood pressure Sister   . High blood pressure Sister        pt thinks  . Breast cancer Sister   . High blood pressure Sister   . High blood pressure Sister   . Headache Daughter   . Diabetes Daughter   . Diabetes Daughter   . Diabetes Other   . Colon cancer Neg Hx   . Esophageal cancer Neg Hx   . Rectal cancer Neg Hx     Medications: Patient's Medications  New Prescriptions   No medications on file  Previous Medications   ALBUTEROL SULFATE (PROAIR RESPICLICK) 341 (90 BASE) MCG/ACT AEPB    Inhale 4 puffs into the lungs every 4 (four) hours as needed.   AMLODIPINE (NORVASC) 5 MG TABLET    Take 10 mg by mouth daily.   AZELASTINE (ASTELIN) 0.1 % NASAL SPRAY    USE 1 SPRAY EACH NOSTRIL TWICE DAILY   BACLOFEN (LIORESAL) 10 MG TABLET    TAKE 1 TABLET BY MOUTH THREE TIMES A DAY AS NEEDED FOR MUSCLE SPASM   BREO ELLIPTA 200-25 MCG/INH AEPB    TAKE 1 PUFF BY MOUTH EVERY DAY   DICLOFENAC SODIUM (VOLTAREN) 1 % GEL    Apply 8 g topically 2 (two) times daily as needed. On knees   FERROUS SULFATE 325 (65 FE) MG TABLET    TAKE 1 TABLET BY MOUTH EVERY DAY WITH BREAKFAST   FLUTICASONE (FLONASE) 50 MCG/ACT NASAL SPRAY    Place 2 sprays into both nostrils daily.   IPRATROPIUM (ATROVENT) 0.03 % NASAL SPRAY    Place 2 sprays into both nostrils 2 (two) times daily.   METFORMIN (GLUCOPHAGE) 500 MG TABLET    TAKE ONE TABLET BY MOUTH TWICE DAILY TO CONTROL BLOOD SUGAR   MONTELUKAST (SINGULAIR) 10 MG TABLET    TAKE 1 TABLET BY MOUTH EVERYDAY AT BEDTIME   OMEPRAZOLE (PRILOSEC) 40 MG CAPSULE    TAKE 1 CAPSULE BY MOUTH EVERY DAY   ONE TOUCH ULTRA TEST TEST STRIP    Check blood sugar once daily as directed   QUINAPRIL  (ACCUPRIL) 40 MG TABLET    Take 1 tablet (40 mg total) by mouth at bedtime.   ROSUVASTATIN (CRESTOR) 5 MG TABLET    TAKE 1 TABLET BY MOUTH EVERY DAY   TIZANIDINE (ZANAFLEX) 4 MG TABLET    Take 1 tablet (4 mg total) by mouth every 6 (six) hours as needed for muscle spasms. For headaches.   TOPIRAMATE (TOPAMAX) 50 MG TABLET    Start with one pill('50mg'$ ) at bedtime and increase to two  pills('100mg'$ ) at bedtime in one week  Modified Medications   No medications on file  Discontinued Medications   BENZONATATE (TESSALON PERLES) 100 MG CAPSULE    Take 1 capsule (100 mg total) by mouth 3 (three) times daily as needed for cough.   TOPIRAMATE (TOPAMAX) 50 MG TABLET    START WITH ONE PILL('50MG'$ ) AT BEDTIME AND INCREASE TO TWO PILLS('100MG'$ ) AT BEDTIME IN ONE WEEK.    Physical Exam:  Vitals:   05/18/19 0949  BP: 132/64  Pulse: 77  Temp: (!) 97.1 F (36.2 C)  TempSrc: Temporal  SpO2: 98%  Weight: 196 lb (88.9 kg)  Height: '5\' 3"'$  (1.6 m)   Body mass index is 34.72 kg/m. Wt Readings from Last 3 Encounters:  05/18/19 196 lb (88.9 kg)  03/30/19 187 lb 6.4 oz (85 kg)  03/02/19 190 lb 6.4 oz (86.4 kg)    Physical Exam Constitutional:      Appearance: Normal appearance. She is obese.  HENT:     Head: Normocephalic.     Right Ear: Decreased hearing noted.     Left Ear: Decreased hearing noted.  Eyes:     General: Vision grossly intact.  Neck:     Thyroid: No thyromegaly.  Cardiovascular:     Rate and Rhythm: Normal rate and regular rhythm.     Pulses: Normal pulses.     Heart sounds: S1 normal and S2 normal. Murmur present.  Pulmonary:     Effort: Pulmonary effort is normal.     Breath sounds: Normal breath sounds.  Abdominal:     General: Bowel sounds are normal.     Palpations: Abdomen is soft.     Tenderness: There is no abdominal tenderness.  Musculoskeletal:     Cervical back: Normal range of motion and neck supple.     Left knee: Decreased range of motion (brace).     Right lower  leg: No edema.     Left lower leg: No edema.  Skin:    General: Skin is warm and dry.     Capillary Refill: Capillary refill takes less than 2 seconds.  Neurological:     Mental Status: She is alert and oriented to person, place, and time.     Gait: Gait normal.  Psychiatric:        Mood and Affect: Mood and affect normal.        Behavior: Behavior is cooperative.     Labs reviewed: Basic Metabolic Panel: Recent Labs    12/13/18 1431 01/24/19 1121 02/11/19 1009 02/23/19 1152 03/30/19 1119  NA 145*  --  141 140 141  K 3.9  --  3.8 3.8 3.7  CL 105  --  108 106 108  CO2 19*  --  '22 26 23  '$ GLUCOSE 113*  --  128* 113* 116*  BUN 10  --  13 6* 10  CREATININE 0.78   < > 1.12* 0.91 0.75  CALCIUM 10.0  --  9.6 10.0 10.1  TSH 0.517  --   --   --   --    < > = values in this interval not displayed.   Liver Function Tests: Recent Labs    02/08/19 1752 02/11/19 1009 02/23/19 1152 03/30/19 1119  AST 510* 310* 76* 35  ALT 503* 326* 111* 28  ALKPHOS 176*  --   --   --   BILITOT 3.2* 2.1* 2.8* 0.8  PROT 8.8* 7.3 7.3 7.8  ALBUMIN 3.8  --   --   --  Recent Labs    01/24/19 1121 02/08/19 1752  LIPASE 19 32  AMYLASE 28  --    No results for input(s): AMMONIA in the last 8760 hours. CBC: Recent Labs    01/28/19 0932 02/08/19 1752 02/23/19 1152 03/30/19 1119  WBC 9.7 9.9 6.1 5.9  NEUTROABS 4,093  --  3,678 3,339  HGB 9.6* 11.1* 10.2* 11.5*  HCT 30.3* 34.4* 32.5* 35.5  MCV 81.5 79.3* 81.5 83.3  PLT 415* 314 275 298   Lipid Panel: Recent Labs    01/24/19 1121 03/30/19 1119  CHOL 110 255*  HDL 13* 50  LDLCALC 72 183*  TRIG 168* 98  CHOLHDL 8.5* 5.1*   TSH: Recent Labs    12/13/18 1431  TSH 0.517   A1C: Lab Results  Component Value Date   HGBA1C 6.9 (H) 01/24/2019     Assessment/Plan 1. Hyperlipidemia LDL goal <100 Tolerating crestor, no side effects noted. Encouraged to continue dietary modifications. - Lipid Panel - CMP with eGFR([facility])  2.  Essential hypertension, benign -controlled at this time. Will continue current regimen.  3. Type 2 diabetes mellitus without complication, without long-term current use of insulin ([place]) metformin had been stopped, will have her continue to not take medication and work on dietary modifications as blood sugars appear to be doing well without medication at this time. - Hemoglobin A1c  4. Elevated LFTs -normal on last lab however restarted Crestor due to significantly elevated LDL, will follow up enzymes at this time. - CMP with eGFR([facility])  Next appt: 3 months  Antrice Pal K. [place], [facility] [phone]

## 2019-05-18 NOTE — Patient Instructions (Signed)
Continue to work on diet modifications- diabetic diet- your off your diabetic medication   Diabetes Mellitus and Nutrition, Adult When you have diabetes (diabetes mellitus), it is very important to have healthy eating habits because your blood sugar (glucose) levels are greatly affected by what you eat and drink. Eating healthy foods in the appropriate amounts, at about the same times every day, can help you:  Control your blood glucose.  Lower your risk of heart disease.  Improve your blood pressure.  Reach or maintain a healthy weight. Every person with diabetes is different, and each person has different needs for a meal plan. Your health care provider may recommend that you work with a diet and nutrition specialist (dietitian) to make a meal plan that is best for you. Your meal plan may vary depending on factors such as:  The calories you need.  The medicines you take.  Your weight.  Your blood glucose, blood pressure, and cholesterol levels.  Your activity level.  Other health conditions you have, such as heart or kidney disease. How do carbohydrates affect me? Carbohydrates, also called carbs, affect your blood glucose level more than any other type of food. Eating carbs naturally raises the amount of glucose in your blood. Carb counting is a method for keeping track of how many carbs you eat. Counting carbs is important to keep your blood glucose at a healthy level, especially if you use insulin or take certain oral diabetes medicines. It is important to know how many carbs you can safely have in each meal. This is different for every person. Your dietitian can help you calculate how many carbs you should have at each meal and for each snack. Foods that contain carbs include:  Bread, cereal, rice, pasta, and crackers.  Potatoes and corn.  Peas, beans, and lentils.  Milk and yogurt.  Fruit and juice.  Desserts, such as cakes, cookies, ice cream, and candy. How does  alcohol affect me? Alcohol can cause a sudden decrease in blood glucose (hypoglycemia), especially if you use insulin or take certain oral diabetes medicines. Hypoglycemia can be a life-threatening condition. Symptoms of hypoglycemia (sleepiness, dizziness, and confusion) are similar to symptoms of having too much alcohol. If your health care provider says that alcohol is safe for you, follow these guidelines:  Limit alcohol intake to no more than 1 drink per day for nonpregnant women and 2 drinks per day for men. One drink equals 12 oz of beer, 5 oz of wine, or 1 oz of hard liquor.  Do not drink on an empty stomach.  Keep yourself hydrated with water, diet soda, or unsweetened iced tea.  Keep in mind that regular soda, juice, and other mixers may contain a lot of sugar and must be counted as carbs. What are tips for following this plan?  Reading food labels  Start by checking the serving size on the "Nutrition Facts" label of packaged foods and drinks. The amount of calories, carbs, fats, and other nutrients listed on the label is based on one serving of the item. Many items contain more than one serving per package.  Check the total grams (g) of carbs in one serving. You can calculate the number of servings of carbs in one serving by dividing the total carbs by 15. For example, if a food has 30 g of total carbs, it would be equal to 2 servings of carbs.  Check the number of grams (g) of saturated and trans fats in one serving. Choose  foods that have low or no amount of these fats.  Check the number of milligrams (mg) of salt (sodium) in one serving. Most people should limit total sodium intake to less than 2,300 mg per day.  Always check the nutrition information of foods labeled as "low-fat" or "nonfat". These foods may be higher in added sugar or refined carbs and should be avoided.  Talk to your dietitian to identify your daily goals for nutrients listed on the  label. Shopping  Avoid buying canned, premade, or processed foods. These foods tend to be high in fat, sodium, and added sugar.  Shop around the outside edge of the grocery store. This includes fresh fruits and vegetables, bulk grains, fresh meats, and fresh dairy. Cooking  Use low-heat cooking methods, such as baking, instead of high-heat cooking methods like deep frying.  Cook using healthy oils, such as olive, canola, or sunflower oil.  Avoid cooking with butter, cream, or high-fat meats. Meal planning  Eat meals and snacks regularly, preferably at the same times every day. Avoid going long periods of time without eating.  Eat foods high in fiber, such as fresh fruits, vegetables, beans, and whole grains. Talk to your dietitian about how many servings of carbs you can eat at each meal.  Eat 4-6 ounces (oz) of lean protein each day, such as lean meat, chicken, fish, eggs, or tofu. One oz of lean protein is equal to: ? 1 oz of meat, chicken, or fish. ? 1 egg. ?  cup of tofu.  Eat some foods each day that contain healthy fats, such as avocado, nuts, seeds, and fish. Lifestyle  Check your blood glucose regularly.  Exercise regularly as told by your health care provider. This may include: ? 150 minutes of moderate-intensity or vigorous-intensity exercise each week. This could be brisk walking, biking, or water aerobics. ? Stretching and doing strength exercises, such as yoga or weightlifting, at least 2 times a week.  Take medicines as told by your health care provider.  Do not use any products that contain nicotine or tobacco, such as cigarettes and e-cigarettes. If you need help quitting, ask your health care provider.  Work with a Social worker or diabetes educator to identify strategies to manage stress and any emotional and social challenges. Questions to ask a health care provider  Do I need to meet with a diabetes educator?  Do I need to meet with a dietitian?  What  number can I call if I have questions?  When are the best times to check my blood glucose? Where to find more information:  American Diabetes Association: diabetes.org  Academy of Nutrition and Dietetics: www.eatright.[employer] of Diabetes and Digestive and Kidney Diseases (NIH): [employer] Summary  A healthy meal plan will help you control your blood glucose and maintain a healthy lifestyle.  Working with a diet and nutrition specialist (dietitian) can help you make a meal plan that is best for you.  Keep in mind that carbohydrates (carbs) and alcohol have immediate effects on your blood glucose levels. It is important to count carbs and to use alcohol carefully. This information is not intended to replace advice given to you by your health care provider. Make sure you discuss any questions you have with your health care provider. Document Released: 02/06/2005 Document Revised: 04/24/2017 Document Reviewed: 06/16/2016 Elsevier Patient Education  2020 [employer].

## 2019-05-19 LAB — LIPID PANEL
Cholesterol: 204 mg/dL — ABNORMAL HIGH (ref <200)
HDL: 73 mg/dL (ref >=50)
LDL Cholesterol (Calc): 116 mg/dL (calc) — ABNORMAL HIGH
Non-HDL Cholesterol (Calc): 131 mg/dL (calc) — ABNORMAL HIGH (ref <130)
Total CHOL/HDL Ratio: 2.8 (calc) (ref <5.0)
Triglycerides: 60 mg/dL (ref <150)

## 2019-05-19 LAB — HEMOGLOBIN A1C
Hgb A1c MFr Bld: 6.8 % of total Hgb — ABNORMAL HIGH (ref <5.7)
Mean Plasma Glucose: 148 (calc)
eAG (mmol/L): 8.2 (calc)

## 2019-05-19 LAB — COMPLETE METABOLIC PANEL WITH GFR
AG Ratio: 1.2 (calc) (ref 1.0–2.5)
ALT: 21 U/L (ref 6–29)
AST: 28 U/L (ref 10–35)
Albumin: 4.7 g/dL (ref 3.6–5.1)
Alkaline phosphatase (APISO): 174 U/L — ABNORMAL HIGH (ref 37–153)
BUN: 13 mg/dL (ref 7–25)
CO2: 24 mmol/L (ref 20–32)
Calcium: 10.1 mg/dL (ref 8.6–10.4)
Chloride: 106 mmol/L (ref 98–110)
Creat: 0.99 mg/dL (ref 0.50–0.99)
GFR, Est African American: 70 mL/min/{1.73_m2} (ref >=60)
GFR, Est Non African American: 60 mL/min/{1.73_m2} (ref >=60)
Globulin: 3.8 g/dL (calc) — ABNORMAL HIGH (ref 1.9–3.7)
Glucose, Bld: 147 mg/dL — ABNORMAL HIGH (ref 65–99)
Potassium: 4.1 mmol/L (ref 3.5–5.3)
Sodium: 139 mmol/L (ref 135–146)
Total Bilirubin: 0.5 mg/dL (ref 0.2–1.2)
Total Protein: 8.5 g/dL — ABNORMAL HIGH (ref 6.1–8.1)

## 2019-05-27 HISTORY — PX: EYE SURGERY: SHX253

## 2019-06-02 ENCOUNTER — Other Ambulatory Visit: Payer: Self-pay

## 2019-06-02 ENCOUNTER — Encounter: Payer: Self-pay | Admitting: Allergy & Immunology

## 2019-06-02 ENCOUNTER — Ambulatory Visit: Payer: Self-pay | Admitting: Allergy & Immunology

## 2019-06-02 ENCOUNTER — Ambulatory Visit: Payer: Medicare Other | Admitting: Allergy & Immunology

## 2019-06-02 VITALS — BP 124/72 | HR 86 | Temp 97.2°F | Resp 18 | Ht 62.0 in | Wt 191.0 lb

## 2019-06-02 DIAGNOSIS — J302 Other seasonal allergic rhinitis: Secondary | ICD-10-CM | POA: Diagnosis not present

## 2019-06-02 DIAGNOSIS — J4540 Moderate persistent asthma, uncomplicated: Secondary | ICD-10-CM | POA: Diagnosis not present

## 2019-06-02 DIAGNOSIS — J3089 Other allergic rhinitis: Secondary | ICD-10-CM | POA: Diagnosis not present

## 2019-06-02 NOTE — Patient Instructions (Addendum)
1. Moderate persistent asthma, uncomplicated - It seems that your asthma medications are working well at this time.  - We will not make any medication changes. - Daily controller medication(s): Breo 200/70mcg one puff once daily - Prior to physical activity: ProAir 2 puffs 10-15 minutes before physical activity. - Rescue medications: ProAir 4 puffs every 4-6 hours as needed or albuterol nebulizer one vial every 4-6 hours as needed - Asthma control goals:  * Full participation in all desired activities (may need albuterol before activity) * Albuterol use two time or less a week on average (not counting use with activity) * Cough interfering with sleep two time or less a month * Oral steroids no more than once a year * No hospitalizations  2. Chronic rhinitis - Continue with nasal ipratropium one spray per nostril every six hours as needed. - Continue with fluticasone nasal spray two sprays per nostril daily. - Continue with Singulair 10mg  daily.  3. GERD  - Controlled without reflux medications.   4. Return in about 6 months (around 11/30/2019). This can be an in-person, a virtual Webex or a telephone follow up visit.   Please inform us of any Emergency Department visits, hospitalizations, or changes in symptoms. Call us before going to the ED for breathing or allergy symptoms since we might be able to fit you in for a sick visit. Feel free to contact us anytime with any questions, problems, or concerns.  It was a pleasure to see you again today!  Websites that have reliable patient information: 1. American Academy of Asthma, Allergy, and Immunology: www.aaaai.org 2. Food Allergy Research and Education (FARE): foodallergy.org 3. Mothers of Asthmatics: http://www.asthmacommunitynetwork.org 4. American College of Allergy, Asthma, and Immunology: www.acaai.org  "Like" [place] on Facebook and Instagram for our latest updates!        Make sure you are registered to vote! If you have moved  or changed any of your contact information, you will need to get this updated before voting!  In some cases, you MAY be able to register to vote online: [email or web address]

## 2019-06-02 NOTE — Progress Notes (Signed)
FOLLOW UP  Date of Service/Encounter:  06/02/19   Assessment:   Moderate persistent asthma, uncomplicated - very stable  Seasonal and perennial allergic rhinitis(trees, mold, dust mite)  Gastroesophageal reflux disease   Plan/Recommendations:   1. Moderate persistent asthma, uncomplicated - It seems that your asthma medications are working well at this time.  - We will not make any medication changes. - Daily controller medication(s): Breo 200/63mcg one puff once daily - Prior to physical activity: ProAir 2 puffs 10-15 minutes before physical activity. - Rescue medications: ProAir 4 puffs every 4-6 hours as needed or albuterol nebulizer one vial every 4-6 hours as needed - Asthma control goals:  * Full participation in all desired activities (may need albuterol before activity) * Albuterol use two time or less a week on average (not counting use with activity) * Cough interfering with sleep two time or less a month * Oral steroids no more than once a year * No hospitalizations  2. Chronic rhinitis - Continue with nasal ipratropium one spray per nostril every six hours as needed. - Continue with fluticasone nasal spray two sprays per nostril daily. - Continue with Singulair 10mg  daily.  3. GERD  - Controlled without reflux medications.   4. Return in about 6 months (around 11/30/2019). This can be an in-person, a virtual Webex or a telephone follow up visit.   Subjective:   Meyer Kenly Deluise is a 64 y.o. female presenting today for follow up of  Chief Complaint  Patient presents with  . Asthma    doing pretty good with the current medications. no steroids or antibiotics recently. she did notice a minor cough due to weather change but nothing bad.     Siren Kaylynn Triola has a history of the following: Patient Active Problem List   Diagnosis Date Noted  . Abnormal EKG 03/02/2019  . Incomplete left bundle branch block (LBBB) 03/02/2019  . Rebound  headache 12/13/2018  . Overuse of medication 12/13/2018  . Intractable chronic migraine without aura and without status migrainosus 12/13/2018  . Microcytic anemia 02/24/2018  . High risk medication use 08/06/2017  . Seasonal and perennial allergic rhinitis 07/30/2017  . Recurrent infections 07/30/2017  . Cough 06/08/2017  . Moderate persistent asthma without complication [id number]  . Chronic constipation 02/04/2017  . Hyperlipidemia due to type 2 diabetes mellitus ([place]) 02/04/2017  . Hallux rigidus, left foot 01/01/2017  . Postmenopausal symptoms 02/22/2016  . Arthrofibrosis of knee joint 08/28/2015  . Left hip pain 10/08/2014  . Type 2 diabetes mellitus without complication, without long-term current use of insulin ([place]) 10/08/2014  . Hyperlipidemia LDL goal <100 10/08/2014  . Essential hypertension, benign 08/30/2014  . Diabetes mellitus, type 2 ([place]) 08/30/2014  . Diverticulosis of colon without hemorrhage 08/30/2014  . Chronic low back pain 08/30/2014  . Chronic pain syndrome 08/30/2014  . Asthma in adult without complication [id number]  . 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 Osteoarthritis of left knee 02/23/2012    History obtained from: chart review and patient.  Itzamara is a 64 y.o. female presenting for a follow up visit.  She was last seen in August 2020.  At that time, we treated her with prednisone and doxycycline for acute sinusitis.  We added on Mucinex as well as Best boy.  For her asthma, we continued Breo 200/25 mcg 1 puff once daily as well as proair as needed.  We also continued Flonase, Singulair, and nasal Atrovent.  Since the last  visit, she has done well. She is having some knee pain and is in a knee brace and uses a cane. She is having some increased pain recently, and has had surgeries in 2013 and 2017. She also lost her mother in September 2020. She was 64 years old. This really caused Deauna to have a "set back".    Asthma/Respiratory Symptom History: She remains on the Breo one puff once daily. She will use her rescue inahler when she needs it but otherwise does fine. She has not been to the ED for her breathing. She denies any nighttime symptoms at all.   Allergic Rhinitis Symptom History: She remains on her nose spray and does well with this. She has not needed antibiotics at all. She is not using her medications on a routine basis. She does not see ENT at all.   Otherwise, there have been no changes to her past medical history, surgical history, family history, or social history.    Review of Systems  Constitutional: Negative.  Negative for chills, fever, malaise/fatigue and weight loss.  HENT: Negative.  Negative for congestion, ear discharge, ear pain, sinus pain and sore throat.   Eyes: Negative for pain, discharge and redness.  Respiratory: Negative for cough, sputum production, shortness of breath and wheezing.   Cardiovascular: Negative.  Negative for chest pain and palpitations.  Gastrointestinal: Negative for abdominal pain, constipation, diarrhea, heartburn, nausea and vomiting.  Skin: Negative.  Negative for itching and rash.  Neurological: Negative for dizziness and headaches.  Endo/Heme/Allergies: Negative for environmental allergies. Does not bruise/bleed easily.       Objective:   Blood pressure 124/72, pulse 86, temperature (!) 97.2 F (36.2 C), temperature source Temporal, resp. rate 18, height 5\' 2"  (1.575 m), weight 191 lb (86.6 kg), SpO2 99 %. Body mass index is 34.93 kg/m.   Physical Exam:  Physical Exam  Constitutional: She appears well-developed.  Pleasant as always. Talkative. Answers phone during the visit and continues to talk during my exam.   HENT:  Head: Normocephalic and atraumatic.  Right Ear: Tympanic membrane, external ear and ear canal normal.  Left Ear: Tympanic membrane, external ear and ear canal normal.  Nose: Mucosal edema and rhinorrhea  present. No nasal deformity or septal deviation. No epistaxis. Right sinus exhibits no maxillary sinus tenderness and no frontal sinus tenderness. Left sinus exhibits no maxillary sinus tenderness and no frontal sinus tenderness.  Mouth/Throat: Uvula is midline and oropharynx is clear and moist. Mucous membranes are not pale and not dry.  Eyes: Pupils are equal, round, and reactive to light. Conjunctivae and EOM are normal. Right eye exhibits no chemosis and no discharge. Left eye exhibits no chemosis and no discharge. Right conjunctiva is not injected. Left conjunctiva is not injected.  Cardiovascular: Normal rate, regular rhythm and normal heart sounds.  Respiratory: Effort normal and breath sounds normal. No accessory muscle usage. No tachypnea. No respiratory distress. She has no wheezes. She has no rhonchi. She has no rales. She exhibits no tenderness.  Moving air well in all lung fields. No increased work of breathing noted.   Musculoskeletal:     Comments: She does have a brace on her left leg.   Lymphadenopathy:    She has no cervical adenopathy.  Neurological: She is alert.  Skin: No abrasion, no petechiae and no rash noted. Rash is not papular, not vesicular and not urticarial. No erythema. No pallor.  Psychiatric: She has a normal mood and affect.     Diagnostic  studies: none      Salvatore Marvel, MD  [facility]

## 2019-06-03 ENCOUNTER — Other Ambulatory Visit: Payer: Self-pay | Admitting: Allergy & Immunology

## 2019-06-07 ENCOUNTER — Other Ambulatory Visit: Payer: Self-pay | Admitting: Nurse Practitioner

## 2019-06-07 ENCOUNTER — Telehealth: Payer: Self-pay | Admitting: Neurology

## 2019-06-07 DIAGNOSIS — E785 Hyperlipidemia, unspecified: Secondary | ICD-10-CM

## 2019-06-07 NOTE — Telephone Encounter (Signed)
Please call patient. We received a notice she is filling baclofen and tizanidine every month. She really should pick one or the other because they are both muscle relaxers and using both can cause sedation and in rare cases respiratory depression. If she is not using one we can cancel. Or if she is using one some days and the other on other days (not together) for different things (often tizanidine is good for migraines and baclofen is good for muscle spasms and actually vice versa as well depending on patient response to each) then just have her be careful not to use both same day.  thanks

## 2019-06-07 NOTE — Telephone Encounter (Signed)
I called the pt & LVM (ok per DPR) asking for a call back. Advised we received a notice that she was filling two muscle relaxants and we would like to discuss the safety. Left office number in message.

## 2019-06-08 NOTE — Telephone Encounter (Signed)
Pt has called Bethany, RN back.  Please call. ?

## 2019-06-09 NOTE — Telephone Encounter (Signed)
I called the pt and discussed Dr. Cathren Laine message below including the risks a/w taking two muscle relaxers, can cause sedation, in rare cases respiratory depression. The pt verbalized understanding. She said she is taking the Baclofen once every day for her knee. She tries not to take it too much. She stated her knee is bad, she wears a brace, has an appt with knee doctor. She reported the Tizanidine she only takes as needed for the headaches. Pt aware I will address with Dr. Jaynee Eagles and call her back.   Spoke with Dr. Jaynee Eagles and explained what pt told me as stated above. Ok per Dr. Jaynee Eagles. I spoke with the patient again. Pt understands not to take these at the same time and to try to avoid taking on the same day if at all possible. Pt understands the risks. I advised pt if she feels she needs the Tizanidine every day she needs to call our office. She also made a f/u appt with Amy on Monday 08/15/2018 @ 11:00 AM. Pt's questions were answered. She verbalized appreciation.

## 2019-06-13 ENCOUNTER — Ambulatory Visit: Payer: Medicare Other | Admitting: Orthopedic Surgery

## 2019-07-04 DIAGNOSIS — E1151 Type 2 diabetes mellitus with diabetic peripheral angiopathy without gangrene: Secondary | ICD-10-CM | POA: Diagnosis not present

## 2019-07-04 DIAGNOSIS — Z008 Encounter for other general examination: Secondary | ICD-10-CM | POA: Diagnosis not present

## 2019-07-04 DIAGNOSIS — M25569 Pain in unspecified knee: Secondary | ICD-10-CM | POA: Diagnosis not present

## 2019-07-19 DIAGNOSIS — Z96652 Presence of left artificial knee joint: Secondary | ICD-10-CM | POA: Diagnosis not present

## 2019-07-19 DIAGNOSIS — Z471 Aftercare following joint replacement surgery: Secondary | ICD-10-CM | POA: Diagnosis not present

## 2019-07-19 DIAGNOSIS — M24662 Ankylosis, left knee: Secondary | ICD-10-CM | POA: Diagnosis not present

## 2019-07-26 DIAGNOSIS — M25562 Pain in left knee: Secondary | ICD-10-CM | POA: Diagnosis not present

## 2019-08-04 DIAGNOSIS — M24662 Ankylosis, left knee: Secondary | ICD-10-CM | POA: Diagnosis not present

## 2019-08-04 DIAGNOSIS — Z96652 Presence of left artificial knee joint: Secondary | ICD-10-CM | POA: Diagnosis not present

## 2019-08-09 ENCOUNTER — Other Ambulatory Visit: Payer: Self-pay

## 2019-08-10 ENCOUNTER — Other Ambulatory Visit: Payer: Self-pay | Admitting: Nurse Practitioner

## 2019-08-15 ENCOUNTER — Ambulatory Visit: Payer: Medicare Other | Admitting: Family Medicine

## 2019-08-15 ENCOUNTER — Encounter: Payer: Self-pay | Admitting: Family Medicine

## 2019-08-15 ENCOUNTER — Other Ambulatory Visit: Payer: Self-pay

## 2019-08-15 VITALS — BP 131/77 | HR 74 | Temp 97.4°F | Ht 62.0 in | Wt 212.6 lb

## 2019-08-15 DIAGNOSIS — G43709 Chronic migraine without aura, not intractable, without status migrainosus: Secondary | ICD-10-CM | POA: Diagnosis not present

## 2019-08-15 DIAGNOSIS — R0683 Snoring: Secondary | ICD-10-CM | POA: Diagnosis not present

## 2019-08-15 DIAGNOSIS — R519 Headache, unspecified: Secondary | ICD-10-CM | POA: Diagnosis not present

## 2019-08-15 DIAGNOSIS — IMO0002 Reserved for concepts with insufficient information to code with codable children: Secondary | ICD-10-CM

## 2019-08-15 NOTE — Progress Notes (Addendum)
PATIENT: Tabitha Oneal DOB: 25-Apr-1956  REASON FOR VISIT: follow up HISTORY FROM: patient  Chief Complaint  Patient presents with  . Follow-up    Rm 5. alone States that she still has occasional headaches. Stipulates that the medications helps,     HISTORY OF PRESENT ILLNESS: Today 08/15/19 Tabitha Oneal is a 64 y.o. female here today for follow up for migraines. She was started on topiramate 100mg  at bedtime in 11/2018. Tizanidine was given for PRN use. She feels that headaches have improved. She was having headaches daily but now feels they occur 2-3 times a week. Tizanidine helps. Headaches can be present in the morning or occur throughout the day. She does have a headache today but blames this on not eating breakfast. She does snore. She wakes herself up at night snoring. She lives alone but reports that when her husband was alive he would fuss at her for snoring. She wakes multiple times at night. She thought that it was normal and has not paid much attention to how she feels in the mornings. She does not usually feel excessively tired throughout the day. Her daughter has sleep apnea treated with CPAP therpay.    HISTORY: (copied from Dr Cathren Laine note on 12/13/2018)  HPI:  Tabitha Oneal is a 64 y.o. female here as requested by [name], NP for headaches.  She has a past medical history of migraines, hypertension, asthma, diabetes, hyperlipidemia, osteoarthritis, contracture of left knee, chronic low back pain, chronic pain syndrome, high risk medication use. She takes excedrin 2x a day. She also takes the "PM kond"  The headaches are more in the front behind both eyes. It is throbbing. It hurts. She has daily headaches. She only took the Topiramate for a few days and then stopped. She has nausea. She has to lay down, throbbing. Sleeping helps. Light sensitivity. The headaches get better with medicaine but it comes back when the medicatuon wears off. She  denies vision changes, numbness, weakness, speech or gait new difficulties. It hurts to bend over, the headaches will wake her up. She does not know if she snores and can;t tell me a lot about her sleeping.  No other focal neurologic deficits, associated symptoms, inciting events or modifiable factors.  Migraine medications tried include: Topamax, baclofen, Excedrin, Tylenol, Zofran, Accupril (ACE inhibitors can be used as migraine prevention), Phenergan  Reviewed notes, labs and imaging from outside physicians, which showed:  I reviewed notes from Sherrie Mustache.  Patient has a history of migraines.  Patient reports she often wakes up in the morning with a headache.  She was tried on several migraine medications and it has not helped.  Baclofen has not helped either.  She takes Excedrin Migraine often.  She denies neck tension.  Reports the head pain is in the front near her eyes.  She has blurry/cloudy vision even with her glasses.  Her last eye exam was at least a year ago or maybe more.  She has uncontrolled blood pressure in the office it was 168/97.  She does eat mostly canned foods at home.  She is on Topamax 25 mg twice daily.   REVIEW OF SYSTEMS: Out of a complete 14 system review of symptoms, the patient complains only of the following symptoms, headaches, snoring, knee pain and all other reviewed systems are negative.  ALLERGIES: No Known Allergies  HOME MEDICATIONS: Outpatient Medications Prior to Visit  Medication Sig Dispense Refill  . Albuterol Sulfate (PROAIR RESPICLICK)  108 (90 Base) MCG/ACT AEPB Inhale 4 puffs into the lungs every 4 (four) hours as needed. 1 each 1  . amLODipine (NORVASC) 5 MG tablet Take 10 mg by mouth daily.    Marland Kitchen azelastine (ASTELIN) 0.1 % nasal spray USE 1 SPRAY EACH NOSTRIL TWICE DAILY 30 mL 5  . BREO ELLIPTA 200-25 MCG/INH AEPB TAKE 1 PUFF BY MOUTH EVERY DAY 60 each 5  . diclofenac sodium (VOLTAREN) 1 % GEL Apply 8 g topically 2 (two) times daily as  needed. On knees    . ferrous sulfate 325 (65 FE) MG tablet TAKE 1 TABLET BY MOUTH EVERY DAY WITH BREAKFAST 90 tablet 1  . fluticasone (FLONASE) 50 MCG/ACT nasal spray Place 2 sprays into both nostrils daily. 48 g 2  . ipratropium (ATROVENT) 0.03 % nasal spray Place 2 sprays into both nostrils 2 (two) times daily. 90 mL 1  . metFORMIN (GLUCOPHAGE) 500 MG tablet Take 1 tablet (500 mg total) by mouth 2 (two) times daily with a meal. APPOINTMENT OVERDUE 60 tablet 0  . montelukast (SINGULAIR) 10 MG tablet TAKE 1 TABLET BY MOUTH EVERYDAY AT BEDTIME 90 tablet 5  . omeprazole (PRILOSEC) 40 MG capsule TAKE 1 CAPSULE BY MOUTH EVERY DAY 90 capsule 1  . ONE TOUCH ULTRA TEST test strip Check blood sugar once daily as directed  3  . quinapril (ACCUPRIL) 40 MG tablet Take 1 tablet (40 mg total) by mouth at bedtime. 90 tablet 1  . rosuvastatin (CRESTOR) 5 MG tablet TAKE 1 TABLET BY MOUTH EVERY DAY 30 tablet 5  . tiZANidine (ZANAFLEX) 4 MG tablet Take 1 tablet (4 mg total) by mouth every 6 (six) hours as needed for muscle spasms. For headaches. 90 tablet 3  . topiramate (TOPAMAX) 50 MG tablet Start with one pill(50mg ) at bedtime and increase to two pills(100mg ) at bedtime in one week    . gabapentin (NEURONTIN) 100 MG capsule Take 100 mg by mouth 3 (three) times daily.    . baclofen (LIORESAL) 10 MG tablet TAKE 1 TABLET BY MOUTH THREE TIMES A DAY AS NEEDED FOR MUSCLE SPASM     No facility-administered medications prior to visit.    PAST MEDICAL HISTORY: Past Medical History:  Diagnosis Date  . Asthma    QVAR daily and Albuterol as needed  . Bladder infection    taking Keflex daily   . Chronic back pain    reason unknown  . Diabetes mellitus    takes Metformin and Tradjenta daily  . GERD (gastroesophageal reflux disease)    takes Omeprazole daily  . Hard of hearing   . Headache    daily.Takes Excedrine daily  . Hepatitis C   . History of blood transfusion    no abnormal reaction noted  .  Hyperlipidemia    takes Atorvastatin daily  . Hypertension    takes Quinapril daily  . Joint pain   . Joint swelling   . Nocturia   . Obesity   . Osteoarthritis of left knee 02/23/2012  . Seasonal allergies    takes Claritin and Singulair daily.Nasal spray as needed  . Shortness of breath dyspnea    with exertion    PAST SURGICAL HISTORY: Past Surgical History:  Procedure Laterality Date  . ABDOMINAL HYSTERECTOMY    . INJECTION KNEE  04/09/2012   Procedure: KNEE INJECTION;  Surgeon: Johnny Bridge, MD;  Location: [facility];  Service: Orthopedics;  Laterality: Left;  . JOINT REPLACEMENT  acl   bil knees  . KNEE ARTHROTOMY Left 08/28/2015   Procedure: LEFT KNEE ARTHROFIBROSIS EXCISION; pylectomy;  Surgeon: Meredith Pel, MD;  Location: [facility];  Service: Orthopedics;  Laterality: Left;  . KNEE CLOSED REDUCTION  04/09/2012   Procedure: CLOSED MANIPULATION KNEE;  Surgeon: Johnny Bridge, MD;  Location: [facility];  Service: Orthopedics;  Laterality: Left;  Manipulation Knee with Anesthesia includes Application of Traction   . TOTAL KNEE ARTHROPLASTY  02/23/2012   Procedure: TOTAL KNEE ARTHROPLASTY;  Surgeon: Johnny Bridge, MD;  Location: [facility];  Service: Orthopedics;  Laterality: Left;    FAMILY HISTORY: Family History  Problem Relation Age of Onset  . Bronchitis Father 74  . Alzheimer's disease Mother   . Ovarian cancer Mother   . Stomach cancer Mother   . High blood pressure Sister   . High blood pressure Sister   . Headache Sister        pt thinks   . High blood pressure Sister   . High blood pressure Sister        pt thinks  . Breast cancer Sister   . High blood pressure Sister   . High blood pressure Sister   . Headache Daughter   . Diabetes Daughter   . Diabetes Daughter   . Diabetes Other   . Colon cancer Neg Hx   . Esophageal cancer Neg Hx   . Rectal cancer Neg Hx     SOCIAL HISTORY: Social History   Socioeconomic  History  . Marital status: Widowed    Spouse name: Not on file  . Number of children: 4  . Years of education: 4  . Highest education level: Some college, no degree  Occupational History  . Not on file  Tobacco Use  . Smoking status: Never Smoker  . Smokeless tobacco: Never Used  Substance and Sexual Activity  . Alcohol use: No    Alcohol/week: 0.0 standard drinks  . Drug use: No  . Sexual activity: Yes    Birth control/protection: Surgical  Other Topics Concern  . Not on file  Social History Narrative   Diet: none   Do you drink/eat things with caffeine ? Coffee    Material status: widow     What year were you married? 08/01/1978   Do you live in a house, apartment, assisted living,condo, trailer,ect.)? Townhouse (temporary)    Is it one or more stories? Yes   How many persons live in your home? Two   Do you have any pets in your home ? Yes, Shih-tzu   Current or past profession: none   Do you exercise? No  Type & how often: no   Do you have a living will ? No   Do you have a DNR form? No   If not, do you want to discuss one?  Not now   Do you have signed POA /HPOA forms? No    If so, please bring to your appointment.            Update 12/13/2018: pt states she doesn't drink much coffee, tea   She lives alone                     Social Determinants of Health   Financial Resource Strain:   . Difficulty of Paying Living Expenses:   Food Insecurity:   . Worried About Charity fundraiser in the Last Year:   . [employer]  of Food in the Last Year:   Transportation Needs:   . Film/video editor (Medical):   Marland Kitchen Lack of Transportation (Non-Medical):   Physical Activity:   . Days of Exercise per Week:   . Minutes of Exercise per Session:   Stress:   . Feeling of Stress :   Social Connections:   . Frequency of Communication with Friends and Family:   . Frequency of Social Gatherings with Friends and Family:   . Attends Religious Services:   . Active Member of Clubs  or Organizations:   . Attends Archivist Meetings:   Marland Kitchen Marital Status:   Intimate Partner Violence:   . Fear of Current or Ex-Partner:   . Emotionally Abused:   Marland Kitchen Physically Abused:   . Sexually Abused:       PHYSICAL EXAM  Vitals:   08/15/19 1112  BP: 131/77  Pulse: 74  Temp: (!) 97.4 F (36.3 C)  Weight: 212 lb 9.6 oz (96.4 kg)  Height: 5\' 2"  (1.575 m)   Body mass index is 38.89 kg/m.  Generalized: Well developed, in no acute distress  Cardiology: normal rate and rhythm, no murmur noted Neurological examination  Mentation: Alert oriented to time, place, history taking. Follows all commands speech and language fluent Cranial nerve II-XII: Pupils were equal round reactive to light. Extraocular movements were full, visual field were full on confrontational test. Facial sensation and strength were normal. Uvula tongue midline. Head turning and shoulder shrug  were normal and symmetric. Motor: The motor testing reveals 5 over 5 strength of all 4 extremities. Good symmetric motor tone is noted throughout.  Sensory: Sensory testing is intact to soft touch on all 4 extremities. No evidence of extinction is noted.  Coordination: Cerebellar testing reveals good finger-nose-finger and right to left heel-to-shin, unable to perform left to right due to knee brace.  Gait and station: has left limp due to left knee brace, uses single prong cane for stability   DIAGNOSTIC DATA (LABS, IMAGING, TESTING) - I reviewed patient records, labs, notes, testing and imaging myself where available.  MMSE - Mini Mental State Exam 05/18/2019  Orientation to time 5  Orientation to Place 5  Registration 3  Attention/ Calculation 5  Recall 3  Language- name 2 objects 2  Language- repeat 1  Language- follow 3 step command 3  Language- read & follow direction 1  Write a sentence 1  Copy design 1  Total score 30     Lab Results  Component Value Date   WBC 5.9 03/30/2019   HGB 11.5  (L) 03/30/2019   HCT 35.5 03/30/2019   MCV 83.3 03/30/2019   PLT 298 03/30/2019      Component Value Date/Time   NA 139 05/18/2019 1026   NA 145 (H) 12/13/2018 1431   K 4.1 05/18/2019 1026   CL 106 05/18/2019 1026   CO2 24 05/18/2019 1026   GLUCOSE 147 (H) 05/18/2019 1026   BUN 13 05/18/2019 1026   BUN 10 12/13/2018 1431   CREATININE 0.99 05/18/2019 1026   CALCIUM 10.1 05/18/2019 1026   PROT 8.5 (H) 05/18/2019 1026   PROT 7.5 11/21/2015 1026   ALBUMIN 3.8 02/08/2019 1752   ALBUMIN 4.4 11/21/2015 1026   AST 28 05/18/2019 1026   ALT 21 05/18/2019 1026   ALKPHOS 176 (H) 02/08/2019 1752   BILITOT 0.5 05/18/2019 1026   BILITOT 0.3 11/21/2015 1026   GFRNONAA 60 05/18/2019 1026   GFRAA 70 05/18/2019 1026  Lab Results  Component Value Date   CHOL 204 (H) 05/18/2019   HDL 73 05/18/2019   LDLCALC 116 (H) 05/18/2019   TRIG 60 05/18/2019   CHOLHDL 2.8 05/18/2019   Lab Results  Component Value Date   HGBA1C 6.8 (H) 05/18/2019   No results found for: [id number] Lab Results  Component Value Date   TSH 0.517 12/13/2018       ASSESSMENT AND PLAN 64 y.o. year old female  has a past medical history of Asthma, Bladder infection, Chronic back pain, Diabetes mellitus, GERD (gastroesophageal reflux disease), Hard of hearing, Headache, Hepatitis C, History of blood transfusion, Hyperlipidemia, Hypertension, Joint pain, Joint swelling, Nocturia, Obesity, Osteoarthritis of left knee (02/23/2012), Seasonal allergies, and Shortness of breath dyspnea. here with     ICD-10-CM   1. Chronic migraine  G43.709 Ambulatory referral to Sleep Studies  2. Morning headache  R51.9 Ambulatory referral to Sleep Studies  3. Snoring  R06.83 Ambulatory referral to Sleep Studies    Mrs Patlan is doing better from a headache perspective. She continues to have 12-15 headache days per month. She is unable to quantify how many are migrainous today. She is getting relief with tizanidine and is not using OTC  analgesics. She was encouraged to continue topiramate and tizanidine as ordered. She will avoid missed meals and other triggers as identified. She does report a long standing history of snoring and wakes herself up at night snoring. Due to family history of OSA and continued chronic headaches, I would like for her to have a sleep evaluation with a sleep specialist. We have placed referral today. We will follow up pending sleep consult and sleep study if indicated. She verbalizes understanding and agreement with this plan.    Orders Placed This Encounter  Procedures  . Ambulatory referral to Sleep Studies    Referral Priority:   Routine    Referral Type:   Consultation    Referral Reason:   Specialty Services Required    Number of Visits Requested:   1     No orders of the defined types were placed in this encounter.     I spent 30 minutes with the patient. 50% of this time was spent counseling and educating patient on plan of care and medications.    Debbora Presto, FNP-C 08/15/2019, 1:58 PM [facility] [address], [place],  [address] [phone]  Made any corrections needed, and agree with history, physical, neuro exam,assessment and plan as stated.     Sarina Ill, MD [facility]

## 2019-08-15 NOTE — Patient Instructions (Signed)
Continue topiramate and tizanidine as prescribed   We will send you for a referral to sleep medicine to evaluate for sleep apnea   Follow up pending sleep consult.    Migraine Headache A migraine headache is a very strong throbbing pain on one side or both sides of your head. This type of headache can also cause other symptoms. It can last from 4 hours to 3 days. Talk with your doctor about what things may bring on (trigger) this condition. What are the causes? The exact cause of this condition is not known. This condition may be triggered or caused by:  Drinking alcohol.  Smoking.  Taking medicines, such as: ? Medicine used to treat chest pain (nitroglycerin). ? Birth control pills. ? Estrogen. ? Some blood pressure medicines.  Eating or drinking certain products.  Doing physical activity. Other things that may trigger a migraine headache include:  Having a menstrual period.  Pregnancy.  Hunger.  Stress.  Not getting enough sleep or getting too much sleep.  Weather changes.  Tiredness (fatigue). What increases the risk?  Being 30-83 years old.  Being female.  Having a family history of migraine headaches.  Being Caucasian.  Having depression or anxiety.  Being very overweight. What are the signs or symptoms?  A throbbing pain. This pain may: ? Happen in any area of the head, such as on one side or both sides. ? Make it hard to do daily activities. ? Get worse with physical activity. ? Get worse around bright lights or loud noises.  Other symptoms may include: ? Feeling sick to your stomach (nauseous). ? Vomiting. ? Dizziness. ? Being sensitive to bright lights, loud noises, or smells.  Before you get a migraine headache, you may get warning signs (an aura). An aura may include: ? Seeing flashing lights or having blind spots. ? Seeing bright spots, halos, or zigzag lines. ? Having tunnel vision or blurred vision. ? Having numbness or a tingling  feeling. ? Having trouble talking. ? Having weak muscles.  Some people have symptoms after a migraine headache (postdromal phase), such as: ? Tiredness. ? Trouble thinking (concentrating). How is this treated?  Taking medicines that: ? Relieve pain. ? Relieve the feeling of being sick to your stomach. ? Prevent migraine headaches.  Treatment may also include: ? Having acupuncture. ? Avoiding foods that bring on migraine headaches. ? Learning ways to control your body functions (biofeedback). ? Therapy to help you know and deal with negative thoughts (cognitive behavioral therapy). Follow these instructions at home: Medicines  Take over-the-counter and prescription medicines only as told by your doctor.  Ask your doctor if the medicine prescribed to you: ? Requires you to avoid driving or using heavy machinery. ? Can cause trouble pooping (constipation). You may need to take these steps to prevent or treat trouble pooping:  Drink enough fluid to keep your pee (urine) pale yellow.  Take over-the-counter or prescription medicines.  Eat foods that are high in fiber. These include beans, whole grains, and fresh fruits and vegetables.  Limit foods that are high in fat and sugar. These include fried or sweet foods. Lifestyle  Do not drink alcohol.  Do not use any products that contain nicotine or tobacco, such as cigarettes, e-cigarettes, and chewing tobacco. If you need help quitting, ask your doctor.  Get at least 8 hours of sleep every night.  Limit and deal with stress. General instructions      Keep a journal to find out what  may bring on your migraine headaches. For example, write down: ? What you eat and drink. ? How much sleep you get. ? Any change in what you eat or drink. ? Any change in your medicines.  If you have a migraine headache: ? Avoid things that make your symptoms worse, such as bright lights. ? It may help to lie down in a dark, quiet room. ?  Do not drive or use heavy machinery. ? Ask your doctor what activities are safe for you.  Keep all follow-up visits as told by your doctor. This is important. Contact a doctor if:  You get a migraine headache that is different or worse than others you have had.  You have more than 15 headache days in one month. Get help right away if:  Your migraine headache gets very bad.  Your migraine headache lasts longer than 72 hours.  You have a fever.  You have a stiff neck.  You have trouble seeing.  Your muscles feel weak or like you cannot control them.  You start to lose your balance a lot.  You start to have trouble walking.  You pass out (faint).  You have a seizure. Summary  A migraine headache is a very strong throbbing pain on one side or both sides of your head. These headaches can also cause other symptoms.  This condition may be treated with medicines and changes to your lifestyle.  Keep a journal to find out what may bring on your migraine headaches.  Contact a doctor if you get a migraine headache that is different or worse than others you have had.  Contact your doctor if you have more than 15 headache days in a month. This information is not intended to replace advice given to you by your health care provider. Make sure you discuss any questions you have with your health care provider. Document Revised: 09/03/2018 Document Reviewed: 06/24/2018 Elsevier Patient Education  [place].   Sleep Apnea Sleep apnea affects breathing during sleep. It causes breathing to stop for a short time or to become shallow. It can also increase the risk of:  Heart attack.  Stroke.  Being very overweight (obese).  Diabetes.  Heart failure.  Irregular heartbeat. The goal of treatment is to help you breathe normally again. What are the causes? There are three kinds of sleep apnea:  Obstructive sleep apnea. This is caused by a blocked or collapsed airway.   Central sleep apnea. This happens when the brain does not send the right signals to the muscles that control breathing.  Mixed sleep apnea. This is a combination of obstructive and central sleep apnea. The most common cause of this condition is a collapsed or blocked airway. This can happen if:  Your throat muscles are too relaxed.  Your tongue and tonsils are too large.  You are overweight.  Your airway is too small. What increases the risk?  Being overweight.  Smoking.  Having a small airway.  Being older.  Being female.  Drinking alcohol.  Taking medicines to calm yourself (sedatives or tranquilizers).  Having family members with the condition. What are the signs or symptoms?  Trouble staying asleep.  Being sleepy or tired during the day.  Getting angry a lot.  Loud snoring.  Headaches in the morning.  Not being able to focus your mind (concentrate).  Forgetting things.  Less interest in sex.  Mood swings.  Personality changes.  Feelings of sadness (depression).  Waking up a lot  during the night to pee (urinate).  Dry mouth.  Sore throat. How is this diagnosed?  Your medical history.  A physical exam.  A test that is done when you are sleeping (sleep study). The test is most often done in a sleep lab but may also be done at home. How is this treated?   Sleeping on your side.  Using a medicine to get rid of mucus in your nose (decongestant).  Avoiding the use of alcohol, medicines to help you relax, or certain pain medicines (narcotics).  Losing weight, if needed.  Changing your diet.  Not smoking.  Using a machine to open your airway while you sleep, such as: ? An oral appliance. This is a mouthpiece that shifts your lower jaw forward. ? A CPAP device. This device blows air through a mask when you breathe out (exhale). ? An EPAP device. This has valves that you put in each nostril. ? A BPAP device. This device blows air through a  mask when you breathe in (inhale) and breathe out.  Having surgery if other treatments do not work. It is important to get treatment for sleep apnea. Without treatment, it can lead to:  High blood pressure.  Coronary artery disease.  In men, not being able to have an erection (impotence).  Reduced thinking ability. Follow these instructions at home: Lifestyle  Make changes that your doctor recommends.  Eat a healthy diet.  Lose weight if needed.  Avoid alcohol, medicines to help you relax, and some pain medicines.  Do not use any products that contain nicotine or tobacco, such as cigarettes, e-cigarettes, and chewing tobacco. If you need help quitting, ask your doctor. General instructions  Take over-the-counter and prescription medicines only as told by your doctor.  If you were given a machine to use while you sleep, use it only as told by your doctor.  If you are having surgery, make sure to tell your doctor you have sleep apnea. You may need to bring your device with you.  Keep all follow-up visits as told by your doctor. This is important. Contact a doctor if:  The machine that you were given to use during sleep bothers you or does not seem to be working.  You do not get better.  You get worse. Get help right away if:  Your chest hurts.  You have trouble breathing in enough air.  You have an uncomfortable feeling in your back, arms, or stomach.  You have trouble talking.  One side of your body feels weak.  A part of your face is hanging down. These symptoms may be an emergency. Do not wait to see if the symptoms will go away. Get medical help right away. Call your local emergency services (911 in the [place] Do not drive yourself to the hospital. Summary  This condition affects breathing during sleep.  The most common cause is a collapsed or blocked airway.  The goal of treatment is to help you breathe normally while you sleep. This information is not  intended to replace advice given to you by your health care provider. Make sure you discuss any questions you have with your health care provider. Document Revised: 02/26/2018 Document Reviewed: 01/05/2018 Elsevier Patient Education  [place].

## 2019-08-16 ENCOUNTER — Other Ambulatory Visit: Payer: Self-pay | Admitting: Nurse Practitioner

## 2019-08-16 DIAGNOSIS — I10 Essential (primary) hypertension: Secondary | ICD-10-CM

## 2019-08-17 ENCOUNTER — Ambulatory Visit: Payer: Medicare Other | Admitting: Nurse Practitioner

## 2019-08-24 ENCOUNTER — Ambulatory Visit: Payer: Medicare Other | Admitting: Neurology

## 2019-08-24 ENCOUNTER — Other Ambulatory Visit: Payer: Self-pay

## 2019-08-24 ENCOUNTER — Encounter: Payer: Self-pay | Admitting: *Deleted

## 2019-08-24 ENCOUNTER — Other Ambulatory Visit: Payer: Self-pay | Admitting: Neurology

## 2019-08-24 ENCOUNTER — Encounter: Payer: Self-pay | Admitting: Neurology

## 2019-08-24 VITALS — BP 138/65 | HR 79 | Temp 97.5°F | Ht 62.0 in | Wt 212.0 lb

## 2019-08-24 DIAGNOSIS — E669 Obesity, unspecified: Secondary | ICD-10-CM | POA: Diagnosis not present

## 2019-08-24 DIAGNOSIS — R519 Headache, unspecified: Secondary | ICD-10-CM | POA: Diagnosis not present

## 2019-08-24 DIAGNOSIS — R0683 Snoring: Secondary | ICD-10-CM | POA: Diagnosis not present

## 2019-08-24 DIAGNOSIS — R351 Nocturia: Secondary | ICD-10-CM | POA: Diagnosis not present

## 2019-08-24 NOTE — Telephone Encounter (Signed)
Sent mychart message to pt to clarify her exact dosage before we send refill.

## 2019-08-24 NOTE — Progress Notes (Signed)
Subjective:    Patient ID: Tabitha Oneal is a 64 y.o. female.  HPI     Star Age, MD, PhD [facility] [address]O. [place], [place] [address]  Dear Warren Lacy,   I saw your patient, Tabitha Oneal, upon Your kind request in my sleep clinic today for initial consultation of her sleep disorder, in particular, concern for underlying obstructive sleep apnea.  The patient is unaccompanied today.  As you know, Tabitha Oneal is a 64 year old right-handed woman with an underlying medical history of hypertension, hyperlipidemia, hepatitis C, migraine headaches, reflux disease, chronic back pain, asthma, diabetes, osteoarthritis, s/p L TKA and residual knee pain, seasonal allergies, and obesity, who reports snoring and some sleep disruption, occasional AM headaches. I reviewed your office note from 08/15/2019.  Her Epworth sleepiness score is 6 out of 24, fatigue severity score is 42 out of 63.  She is widowed, she lives alone, has 4 grown children.  She has nocturia about 2-3 times per average night, has had occasional morning headaches.  She is not aware of any family history of OSA, reports that perhaps her daughter has been tested for sleep apnea.  She has a TV in the bedroom and tries to turn it off before falling asleep, generally in bed between 10 and 11 and rise time is 7:53 AM, sometimes 930 or 10.  She is a non-smoker and does not drink alcohol, drinks caffeine in the form of coffee and soda and tea, unclear about the exact amount, not everything every day.  She is working on weight loss.  She would not like to come in for sleep study.  She would consider a home sleep test.  She is not sure if she would be able to use a CPAP machine when I described to her.  Her Past Medical History Is Significant For: Past Medical History:  Diagnosis Date  . Asthma    QVAR daily and Albuterol as needed  . Bladder infection    taking Keflex daily   . Chronic  back pain    reason unknown  . Diabetes mellitus    takes Metformin and Tradjenta daily  . GERD (gastroesophageal reflux disease)    takes Omeprazole daily  . Hard of hearing   . Headache    daily.Takes Excedrine daily  . Hepatitis C   . History of blood transfusion    no abnormal reaction noted  . Hyperlipidemia    takes Atorvastatin daily  . Hypertension    takes Quinapril daily  . Joint pain   . Joint swelling   . Nocturia   . Obesity   . Osteoarthritis of left knee 02/23/2012  . Seasonal allergies    takes Claritin and Singulair daily.Nasal spray as needed  . Shortness of breath dyspnea    with exertion    Her Past Surgical History Is Significant For: Past Surgical History:  Procedure Laterality Date  . ABDOMINAL HYSTERECTOMY    . INJECTION KNEE  04/09/2012   Procedure: KNEE INJECTION;  Surgeon: Johnny Bridge, MD;  Location: [facility];  Service: Orthopedics;  Laterality: Left;  . JOINT REPLACEMENT     acl   bil knees  . KNEE ARTHROTOMY Left 08/28/2015   Procedure: LEFT KNEE ARTHROFIBROSIS EXCISION; pylectomy;  Surgeon: Meredith Pel, MD;  Location: [facility];  Service: Orthopedics;  Laterality: Left;  . KNEE CLOSED REDUCTION  04/09/2012   Procedure: CLOSED MANIPULATION KNEE;  Surgeon: Lenetta Quaker  Mardelle Matte, MD;  Location: [facility];  Service: Orthopedics;  Laterality: Left;  Manipulation Knee with Anesthesia includes Application of Traction   . TOTAL KNEE ARTHROPLASTY  02/23/2012   Procedure: TOTAL KNEE ARTHROPLASTY;  Surgeon: Johnny Bridge, MD;  Location: [facility];  Service: Orthopedics;  Laterality: Left;    Her Family History Is Significant For: Family History  Problem Relation Age of Onset  . Bronchitis Father 52  . Alzheimer's disease Mother   . Ovarian cancer Mother   . Stomach cancer Mother   . High blood pressure Sister   . High blood pressure Sister   . Headache Sister        pt thinks   . High blood pressure Sister   . High  blood pressure Sister        pt thinks  . Breast cancer Sister   . High blood pressure Sister   . High blood pressure Sister   . Headache Daughter   . Diabetes Daughter   . Diabetes Daughter   . Diabetes Other   . Colon cancer Neg Hx   . Esophageal cancer Neg Hx   . Rectal cancer Neg Hx     Her Social History Is Significant For: Social History   Socioeconomic History  . Marital status: Widowed    Spouse name: Not on file  . Number of children: 4  . Years of education: 11  . Highest education level: Some college, no degree  Occupational History  . Not on file  Tobacco Use  . Smoking status: Never Smoker  . Smokeless tobacco: Never Used  Substance and Sexual Activity  . Alcohol use: No    Alcohol/week: 0.0 standard drinks  . Drug use: No  . Sexual activity: Yes    Birth control/protection: Surgical  Other Topics Concern  . Not on file  Social History Narrative   Diet: none   Do you drink/eat things with caffeine ? Coffee    Material status: widow     What year were you married? 08/01/1978   Do you live in a house, apartment, assisted living,condo, trailer,ect.)? Townhouse (temporary)    Is it one or more stories? Yes   How many persons live in your home? Two   Do you have any pets in your home ? Yes, Shih-tzu   Current or past profession: none   Do you exercise? No  Type & how often: no   Do you have a living will ? No   Do you have a DNR form? No   If not, do you want to discuss one?  Not now   Do you have signed POA /HPOA forms? No    If so, please bring to your appointment.            Update 12/13/2018: pt states she doesn't drink much coffee, tea   She lives alone                     Social Determinants of Health   Financial Resource Strain:   . Difficulty of Paying Living Expenses:   Food Insecurity:   . Worried About Charity fundraiser in the Last Year:   . Arboriculturist in the Last Year:   Transportation Needs:   . Film/video editor  (Medical):   Marland Kitchen Lack of Transportation (Non-Medical):   Physical Activity:   . Days of Exercise per Week:   . Minutes of Exercise per  Session:   Stress:   . Feeling of Stress :   Social Connections:   . Frequency of Communication with Friends and Family:   . Frequency of Social Gatherings with Friends and Family:   . Attends Religious Services:   . Active Member of Clubs or Organizations:   . Attends Archivist Meetings:   Marland Kitchen Marital Status:     Her Allergies Are:  No Known Allergies:   Her Current Medications Are:  Outpatient Encounter Medications as of 08/24/2019  Medication Sig  . Albuterol Sulfate (PROAIR RESPICLICK) 123XX123 (90 Base) MCG/ACT AEPB Inhale 4 puffs into the lungs every 4 (four) hours as needed.  Marland Kitchen amLODipine (NORVASC) 5 MG tablet Take 10 mg by mouth daily.  Marland Kitchen azelastine (ASTELIN) 0.1 % nasal spray USE 1 SPRAY EACH NOSTRIL TWICE DAILY  . BREO ELLIPTA 200-25 MCG/INH AEPB TAKE 1 PUFF BY MOUTH EVERY DAY  . diclofenac sodium (VOLTAREN) 1 % GEL Apply 8 g topically 2 (two) times daily as needed. On knees  . ferrous sulfate 325 (65 FE) MG tablet TAKE 1 TABLET BY MOUTH EVERY DAY WITH BREAKFAST  . fluticasone (FLONASE) 50 MCG/ACT nasal spray Place 2 sprays into both nostrils daily.  Marland Kitchen gabapentin (NEURONTIN) 100 MG capsule Take 100 mg by mouth 3 (three) times daily.  Marland Kitchen ipratropium (ATROVENT) 0.03 % nasal spray Place 2 sprays into both nostrils 2 (two) times daily.  . montelukast (SINGULAIR) 10 MG tablet TAKE 1 TABLET BY MOUTH EVERYDAY AT BEDTIME  . omeprazole (PRILOSEC) 40 MG capsule TAKE 1 CAPSULE BY MOUTH EVERY DAY  . ONE TOUCH ULTRA TEST test strip Check blood sugar once daily as directed  . quinapril (ACCUPRIL) 40 MG tablet Take 1 tablet (40 mg total) by mouth at bedtime.  . rosuvastatin (CRESTOR) 5 MG tablet TAKE 1 TABLET BY MOUTH EVERY DAY  . tiZANidine (ZANAFLEX) 4 MG tablet Take 1 tablet (4 mg total) by mouth every 6 (six) hours as needed for muscle spasms. For  headaches.  . topiramate (TOPAMAX) 50 MG tablet Start with one pill(50mg ) at bedtime and increase to two pills(100mg ) at bedtime in one week  . metFORMIN (GLUCOPHAGE) 500 MG tablet Take 1 tablet (500 mg total) by mouth 2 (two) times daily with a meal. APPOINTMENT OVERDUE (Patient not taking: Reported on 08/24/2019)   No facility-administered encounter medications on file as of 08/24/2019.  :   Review of Systems:  Out of a complete 14 point review of systems, all are reviewed and negative with the exception of these symptoms as listed below:  Review of Systems  Neurological:       Pt presents today, states that she snores and she is here today to address her sleep. She states she will wake up frequently during the night.pt states that she wakes up also to use the bathroom Pt unsure of apnea events. Never had a SS.   Epworth Sleepiness Scale 0= would never doze 1= slight chance of dozing 2= moderate chance of dozing 3= high chance of dozing  Sitting and reading:0 Watching TV:2 Sitting inactive in a public place (ex. Theater or meeting):0 As a passenger in a car for an hour without a break:0 Lying down to rest in the afternoon:2 Sitting and talking to someone:1 Sitting quietly after lunch (no alcohol):1 In a car, while stopped in traffic:0 Total:6     Objective:  Neurological Exam  Physical Exam Physical Examination:   Vitals:   08/24/19 1105  BP: 138/65  Pulse:  79  Temp: (!) 97.5 F (36.4 C)    General Examination: The patient is a very pleasant 64 y.o. female in no acute distress. She appears well-developed and well-nourished and well groomed.   HEENT: Normocephalic, atraumatic, pupils are equal, round and reactive to light, extraocular tracking is good without limitation to gaze excursion or nystagmus noted. Hearing is impaired, no hearing aids. Face is symmetric with normal facial animation. Speech is clear with no dysarthria noted. There is no hypophonia. There is no  lip, neck/head, jaw or voice tremor. Neck is supple with full range of passive and active motion. There are no carotid bruits on auscultation. Oropharynx exam reveals: moderate mouth dryness, dentures on top and multiple missing teeth on the bottom, moderate airway crowding secondary to redundant soft palate, tonsils on the smaller side, Mallampati class II, tongue protrudes centrally and palate elevates symmetrically.  Neck circumference is 15-1/4 inches.   Chest: Clear to auscultation without wheezing, rhonchi or crackles noted.  Heart: S1+S2+0, regular and normal without murmurs, rubs or gallops noted.   Abdomen: Soft, non-tender and non-distended with normal bowel sounds appreciated on auscultation.  Extremities: There is no pitting edema in the distal lower extremities bilaterally.   Skin: Warm and dry without trophic changes noted.   Musculoskeletal: exam reveals L knee brace and reports L knee pain.   Neurologically:  Mental status: The patient is awake, alert and oriented in all 4 spheres. Her immediate and remote memory, attention, language skills and fund of knowledge are appropriate. There is no evidence of aphasia, agnosia, apraxia or anomia. Speech is clear with normal prosody and enunciation. Thought process is linear. Mood is normal and affect is normal.  Cranial nerves II - XII are as described above under HEENT exam.  Motor exam: Normal bulk, strength and tone is noted. There is no tremor, fine motor skills and coordination: grossly intact.  Cerebellar testing: No dysmetria or intention tremor. There is no truncal or gait ataxia.  Sensory exam: intact to light touch in the upper and lower extremities.  Gait, station and balance: She stands with difficulty.  She walks with a single-point cane, she has a limp on the left.   Assessment and Plan:   In summary, Kateri Travis Wotring is a very pleasant 64 y.o.-year old female with an underlying medical history of hypertension,  hyperlipidemia, hepatitis C, migraine headaches, reflux disease, chronic back pain, asthma, diabetes, osteoarthritis, s/p L TKA and residual knee pain, seasonal allergies, and obesity, whose history and physical exam are concerning for obstructive sleep apnea (OSA). I had a long chat with the patient about my findings and the diagnosis of OSA, its prognosis and treatment options. We talked about medical treatments, surgical interventions and non-pharmacological approaches. I explained in particular the risks and ramifications of untreated moderate to severe OSA, especially with respect to developing cardiovascular disease down the Road, including congestive heart failure, difficult to treat hypertension, cardiac arrhythmias, or stroke. Even type 2 diabetes has, in part, been linked to untreated OSA. Symptoms of untreated OSA include daytime sleepiness, memory problems, mood irritability and mood disorder such as depression and anxiety, lack of energy, as well as recurrent headaches, especially morning headaches. We talked about trying to maintain a healthy lifestyle in general, as well as the importance of weight control. We also talked about the importance of good sleep hygiene. I recommended the following at this time: sleep study.  I explained the sleep test procedure to the patient and also outlined possible  surgical and non-surgical treatment options of OSA.  She does not wish to come in for her sleep study for overnight testing.  She would be willing to consider a home sleep test and I explained the home test procedure to her.  She is not sure that she would be able to use a CPAP machine but would be willing to consider it. I answered all her questions today and the patient was in agreement. I plan to see her back after the sleep study is completed and encouraged her to call with any interim questions, concerns, problems or updates.   Thank you very much for allowing me to participate in the care of  this nice patient. If I can be of any further assistance to you please do not hesitate to talk to me.  Sincerely,   Star Age, MD, PhD

## 2019-08-31 ENCOUNTER — Other Ambulatory Visit: Payer: Self-pay | Admitting: Neurology

## 2019-08-31 MED ORDER — TOPIRAMATE 100 MG PO TABS: 100.0000 mg | ORAL_TABLET | Freq: Every day | ORAL | 3 refills | Status: DC

## 2019-08-31 NOTE — Telephone Encounter (Signed)
I called the pt. She stated she has been taking Topiramate 100 mg at bedtime. Pt stated her primary care Novella Rob FNP gave her a refill but it's for the 25 mg tablet with instructions to take 1 tablet twice daily. Pt stated she had not been having any problems with the 100 mg. I told her I would let Amy NP know and seek her advise. Pt verbalized appreciation.

## 2019-09-05 ENCOUNTER — Other Ambulatory Visit: Payer: Self-pay | Admitting: Nurse Practitioner

## 2019-09-11 ENCOUNTER — Other Ambulatory Visit: Payer: Self-pay | Admitting: Nurse Practitioner

## 2019-09-13 DIAGNOSIS — M25562 Pain in left knee: Secondary | ICD-10-CM | POA: Insufficient documentation

## 2019-09-14 ENCOUNTER — Ambulatory Visit (INDEPENDENT_AMBULATORY_CARE_PROVIDER_SITE_OTHER): Payer: Medicare Other | Admitting: Neurology

## 2019-09-14 DIAGNOSIS — G4733 Obstructive sleep apnea (adult) (pediatric): Secondary | ICD-10-CM

## 2019-09-14 DIAGNOSIS — R519 Headache, unspecified: Secondary | ICD-10-CM

## 2019-09-14 DIAGNOSIS — E669 Obesity, unspecified: Secondary | ICD-10-CM

## 2019-09-14 DIAGNOSIS — R351 Nocturia: Secondary | ICD-10-CM

## 2019-09-14 DIAGNOSIS — R0683 Snoring: Secondary | ICD-10-CM

## 2019-09-15 DIAGNOSIS — M25562 Pain in left knee: Secondary | ICD-10-CM | POA: Diagnosis not present

## 2019-09-20 ENCOUNTER — Telehealth: Payer: Self-pay

## 2019-09-20 DIAGNOSIS — I10 Essential (primary) hypertension: Secondary | ICD-10-CM | POA: Diagnosis not present

## 2019-09-20 DIAGNOSIS — E1151 Type 2 diabetes mellitus with diabetic peripheral angiopathy without gangrene: Secondary | ICD-10-CM | POA: Diagnosis not present

## 2019-09-20 DIAGNOSIS — E785 Hyperlipidemia, unspecified: Secondary | ICD-10-CM | POA: Diagnosis not present

## 2019-09-20 DIAGNOSIS — Z0001 Encounter for general adult medical examination with abnormal findings: Secondary | ICD-10-CM | POA: Diagnosis not present

## 2019-09-20 DIAGNOSIS — Z008 Encounter for other general examination: Secondary | ICD-10-CM | POA: Diagnosis not present

## 2019-09-20 DIAGNOSIS — Z79899 Other long term (current) drug therapy: Secondary | ICD-10-CM | POA: Diagnosis not present

## 2019-09-20 NOTE — Procedures (Signed)
Patient Information     First Name: Tabitha Last Name: Oneal ID: [id number]  Birth Date: 09-19-1955 Age: 64 Gender: Female  Referring Provider: Debbora Presto, NP BMI: 38.9 (W=211 lb, H=5' 2'')  Neck Circ.:  16 '' Epworth:  6/24   Sleep Study Information    Study Date: 09/14/2019 S/H/A Version: [id number] / 4.1.1528 / 82  History:    64 year old woman with a history of hypertension, hyperlipidemia, hepatitis C, migraine headaches, reflux disease, chronic back pain, asthma, diabetes, osteoarthritis, s/p L TKA and residual knee pain, seasonal allergies, and obesity, who reports snoring and some sleep disruption, occasional AM headaches.  Summary & Diagnosis:     OSA Recommendations:     This home sleep test demonstrates severe obstructive sleep apnea with a total AHI of 30.9/hour and O2 nadir of 83%. Treatment with positive airway pressure (in the form of CPAP) is recommended. This will require a full night CPAP titration study for proper treatment settings, O2 monitoring and mask fitting. Based on the severity of the sleep disordered breathing an attended titration study is indicated. However, patient's insurance has denied an attended sleep study; therefore, the patient will be advised to proceed with an autoPAP titration/trial at home for now. Please note that untreated obstructive sleep apnea may carry additional perioperative morbidity. Patients with significant obstructive sleep apnea should receive perioperative PAP therapy and the surgeons and particularly the anesthesiologist should be informed of the diagnosis and the severity of the sleep disordered breathing. The patient should be cautioned not to drive, work at heights, or operate dangerous or heavy equipment when tired or sleepy. Review and reiteration of good sleep hygiene measures should be pursued with any patient. Other causes of the patient's symptoms, including circadian rhythm disturbances, an underlying mood disorder, medication effect  and/or an underlying medical problem cannot be ruled out based on this test. Clinical correlation is recommended. The patient and her referring provider will be notified of the test results. The patient will be seen in follow up in sleep clinic at [facility].  I certify that I have reviewed the raw data recording prior to the issuance of this report in accordance with the standards of the American Academy of Sleep Medicine (AASM).  Star Age, MD, PhD [facility]) Diplomat, ABPN (Neurology and Sleep)                   Sleep Summary    Oxygen Saturation Statistics     Start Study Time: End Study Time: Total Recording Time:  11:28:48 PM 6:44:48 AM 7 h, 16 min  Total Sleep Time % REM of Sleep Time:  6 h, 34 min  25.0    Mean: 97 Minimum: 83 Maximum: 99  Mean of Desaturations Nadirs (%):   91  Oxygen Desaturation. %:   4-9 10-20 >20 Total  Events Number Total   124  3 97.6 2.4  0 0.0  127 100.0  Oxygen Saturation <90 <=88 <85 <80 <70  Duration (minutes): Sleep % 0.9 0.2  0.6 0.0  0.2 0.0 0.0 0.0 0.0 0.0     Respiratory Indices      Total Events REM NREM All Night  pRDI:  202  pAHI:  202 ODI:  127  pAHIc:  12  % CSR: 0.0 31.8 31.8 18.9 4.9 30.7 30.7 19.6 0.8 30.9 30.9 19.5 1.8       Pulse Rate Statistics during Sleep (BPM)      Mean: 59 Minimum: 46 Maximum:  91    Indices are calculated using technically valid sleep time of  6 h, 31 min. pRDI/pAHI are calculated using oxi desaturations ? 3%    Body Position Statistics  Position Supine Prone Right Left Non-Supine  Sleep (min) 394.0 0.0 0.0 0.0 0.0  Sleep % 100.0 0.0 0.0 0.0 0.0  pRDI 30.9 N/A N/A N/A N/A  pAHI 30.9 N/A N/A N/A N/A  ODI 19.5 N/A N/A N/A N/A     Snoring Statistics Snoring Level (dB) >40 >50 >60 >70 >80 >Threshold (45)  Sleep (min) 175.4 43.7 5.0 0.0 0.0 81.2  Sleep % 44.5 11.1 1.3 0.0 0.0 20.6    Mean: 43 dB Sleep Stages Chart                                                pAHI=30.9                                                           Mild              Moderate                    Severe                                                 5              15                    30  * Reference values are according to AASM guidelines

## 2019-09-20 NOTE — Telephone Encounter (Signed)
I called pt. I advised pt that Dr. Rexene Alberts reviewed their sleep study results and found that pt does have severe osa. Dr. Rexene Alberts recommends that pt start treatment for this in the form of an auto pap. I reviewed PAP compliance expectations with the pt. Pt is agreeable to starting an auto-PAP. I advised pt that an order will be sent to a DME, [facility], and [facility] will call the pt within about one week after they file with the pt's insurance. [facility] will show the pt how to use the machine, fit for masks, and troubleshoot the auto-PAP if needed. A follow up appt was made for insurance purposes with Dr. Rexene Alberts  on 11/24/2019 at 1 pm. Pt verbalized understanding to arrive 30 minutes early and bring their auto-PAP. A letter with all of this information in it will be mailed to the pt as a reminder. I verified with the pt that the address we have on file is correct. Pt verbalized understanding of results. Pt had no questions at this time but was encouraged to call back if questions arise. I have sent the order to [facility] and have received confirmation that they have received the order.

## 2019-09-20 NOTE — Progress Notes (Signed)
Patient referred by Debbora Presto, NP, seen by me on 08/24/19, HST on 09/14/19.    Please call and notify the patient that the recent home sleep test showed obstructive sleep apnea in the severe range. While I recommend treatment for this in the form CPAP, her insurance will not approve a sleep study for this. They will likely only approve a trial of autoPAP, which means, that we don't have to bring her in for a sleep study with CPAP, but will let her try an autoPAP machine at home, through a DME company (of her choice, or as per insurance requirement). The DME representative will educate her on how to use the machine, how to put the mask on, etc. I have placed an order in the chart. Please send referral, talk to patient, send report to referring MD. We will need a FU in sleep clinic for 10 weeks post-PAP set up, please arrange that with me or one of our NPs. Thanks,   Star Age, MD, PhD [facility])

## 2019-09-20 NOTE — Telephone Encounter (Signed)
-----   Message from Star Age, MD sent at 09/20/2019  7:33 AM EDT ----- Patient referred by Debbora Presto, NP, seen by me on 08/24/19, HST on 09/14/19.    Please call and notify the patient that the recent home sleep test showed obstructive sleep apnea in the severe range. While I recommend treatment for this in the form CPAP, her insurance will not approve a sleep study for this. They will likely only approve a trial of autoPAP, which means, that we don't have to bring her in for a sleep study with CPAP, but will let her try an autoPAP machine at home, through a DME company (of her choice, or as per insurance requirement). The DME representative will educate her on how to use the machine, how to put the mask on, etc. I have placed an order in the chart. Please send referral, talk to patient, send report to referring MD. We will need a FU in sleep clinic for 10 weeks post-PAP set up, please arrange that with me or one of our NPs. Thanks,   Star Age, MD, PhD [facility])

## 2019-09-20 NOTE — Addendum Note (Signed)
Addended by: Star Age on: 09/20/2019 07:33 AM   Modules accepted: Orders

## 2019-09-22 DIAGNOSIS — Z1231 Encounter for screening mammogram for malignant neoplasm of breast: Secondary | ICD-10-CM | POA: Diagnosis not present

## 2019-09-29 ENCOUNTER — Other Ambulatory Visit: Payer: Self-pay | Admitting: Nurse Practitioner

## 2019-09-29 DIAGNOSIS — I10 Essential (primary) hypertension: Secondary | ICD-10-CM

## 2019-10-21 ENCOUNTER — Other Ambulatory Visit: Payer: Self-pay | Admitting: Allergy & Immunology

## 2019-11-01 ENCOUNTER — Other Ambulatory Visit: Payer: Self-pay

## 2019-11-01 ENCOUNTER — Ambulatory Visit (INDEPENDENT_AMBULATORY_CARE_PROVIDER_SITE_OTHER): Payer: Medicare Other

## 2019-11-01 ENCOUNTER — Encounter: Payer: Self-pay | Admitting: Sports Medicine

## 2019-11-01 ENCOUNTER — Ambulatory Visit: Payer: Medicare Other | Admitting: Sports Medicine

## 2019-11-01 VITALS — Temp 97.1°F

## 2019-11-01 DIAGNOSIS — M722 Plantar fascial fibromatosis: Secondary | ICD-10-CM | POA: Diagnosis not present

## 2019-11-01 DIAGNOSIS — I739 Peripheral vascular disease, unspecified: Secondary | ICD-10-CM

## 2019-11-01 DIAGNOSIS — M779 Enthesopathy, unspecified: Secondary | ICD-10-CM

## 2019-11-01 DIAGNOSIS — E119 Type 2 diabetes mellitus without complications: Secondary | ICD-10-CM | POA: Diagnosis not present

## 2019-11-01 MED ORDER — PREDNISONE 10 MG (21) PO TBPK
ORAL_TABLET | ORAL | 0 refills | Status: DC
Start: 2019-11-01 — End: 2019-12-22

## 2019-11-01 NOTE — Progress Notes (Signed)
Subjective: Tabitha Oneal is a 63 y.o. female patient presents to office with complaint of severe heel pain on left can not walk to even reach the bathroom 10/10 and reports that she has swelling that improves with elevation. Patient admits to a history of diabetes, last blood sugar this morning was 87mg /dl. Denies any other pedal complaints.   Review of Systems  All other systems reviewed and are negative.    Patient Active Problem List   Diagnosis Date Noted  . Abnormal EKG 03/02/2019  . Incomplete left bundle branch block (LBBB) 03/02/2019  . Rebound headache 12/13/2018  . Overuse of medication 12/13/2018  . Intractable chronic migraine without aura and without status migrainosus 12/13/2018  . Microcytic anemia 02/24/2018  . High risk medication use 08/06/2017  . Seasonal and perennial allergic rhinitis 07/30/2017  . Recurrent infections 07/30/2017  . Cough 06/08/2017  . Moderate persistent asthma without complication 25/95/6387  . Chronic constipation 02/04/2017  . Hyperlipidemia due to type 2 diabetes mellitus ([place]) 02/04/2017  . Hallux rigidus, left foot 01/01/2017  . Postmenopausal symptoms 02/22/2016  . Arthrofibrosis of knee joint 08/28/2015  . Left hip pain 10/08/2014  . Type 2 diabetes mellitus without complication, without long-term current use of insulin ([place]) 10/08/2014  . Hyperlipidemia LDL goal <100 10/08/2014  . Essential hypertension, benign 08/30/2014  . Diabetes mellitus, type 2 ([place]) 08/30/2014  . Diverticulosis of colon without hemorrhage 08/30/2014  . Chronic low back pain 08/30/2014  . Chronic pain syndrome 08/30/2014  . Asthma in adult without complication 56/43/3295  . 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 Osteoarthritis of left knee 02/23/2012    Current Outpatient Medications on File Prior to Visit  Medication Sig Dispense Refill  . Albuterol Sulfate (PROAIR RESPICLICK) 188 (90 Base) MCG/ACT AEPB Inhale 4 puffs  into the lungs every 4 (four) hours as needed. 1 each 1  . amLODipine (NORVASC) 5 MG tablet Take 10 mg by mouth daily.    Marland Kitchen azelastine (ASTELIN) 0.1 % nasal spray USE 1 SPRAY EACH NOSTRIL TWICE DAILY 30 mL 5  . BREO ELLIPTA 200-25 MCG/INH AEPB INHALE 1 PUFF BY MOUTH EVERY DAY 60 each 5  . diclofenac sodium (VOLTAREN) 1 % GEL Apply 8 g topically 2 (two) times daily as needed. On knees    . ferrous sulfate 325 (65 FE) MG tablet TAKE 1 TABLET BY MOUTH EVERY DAY WITH BREAKFAST 90 tablet 1  . fluticasone (FLONASE) 50 MCG/ACT nasal spray Place 2 sprays into both nostrils daily. 48 g 2  . gabapentin (NEURONTIN) 100 MG capsule Take 100 mg by mouth 3 (three) times daily.    Marland Kitchen ipratropium (ATROVENT) 0.03 % nasal spray Place 2 sprays into both nostrils 2 (two) times daily. 90 mL 1  . metFORMIN (GLUCOPHAGE) 500 MG tablet Take 1 tablet (500 mg total) by mouth 2 (two) times daily with a meal. APPOINTMENT OVERDUE 60 tablet 0  . montelukast (SINGULAIR) 10 MG tablet TAKE 1 TABLET BY MOUTH EVERYDAY AT BEDTIME 90 tablet 5  . omeprazole (PRILOSEC) 40 MG capsule TAKE 1 CAPSULE BY MOUTH EVERY DAY 90 capsule 1  . ONE TOUCH ULTRA TEST test strip Check blood sugar once daily as directed  3  . quinapril (ACCUPRIL) 40 MG tablet Take 1 tablet (40 mg total) by mouth at bedtime. 90 tablet 1  . rosuvastatin (CRESTOR) 5 MG tablet TAKE 1 TABLET BY MOUTH EVERY DAY 30 tablet 5  . tiZANidine (ZANAFLEX) 4 MG tablet Take  1 tablet (4 mg total) by mouth every 6 (six) hours as needed for muscle spasms. For headaches. 90 tablet 3   No current facility-administered medications on file prior to visit.    No Known Allergies  Objective: Physical Exam General: The patient is alert and oriented x3 in no acute distress.  Dermatology: Skin is warm, dry and supple bilateral lower extremities. Nails 1-10 are normal. There is no erythema, no eccymosis, no open lesions present. Integument is otherwise unremarkable.  Vascular: Dorsalis Pedis  pulse and Posterior Tibial pulse are 1/4 bilateral. Capillary fill time is immediate to all digits.  Neurological: Grossly intact to light touch bilateral.  Musculoskeletal: Severe tenderness to palpation at the medial calcaneal tubercale and through the insertion of the plantar fascia and achilles on left. There is decreased Ankle joint range of motion bilateral worse on left, + pes planus. Guarding due to pain.   Gait: Unassisted, Antalgic avoid weight on left heel  Xray, Left foot:  Normal osseous mineralization. Joint spaces consistent with arthritis in areas. No fracture/dislocation/boney destruction. ++Calcaneal spur present with moderate thickening of plantar fascia. No other soft tissue abnormalities or radiopaque foreign bodies.   Assessment and Plan: Problem List Items Addressed This Visit    None    Visit Diagnoses    Plantar fasciitis of left foot    -  Primary   Relevant Orders   DG Foot Complete Left (Completed)   Tendonitis       PVD (peripheral vascular disease) ([place])       Diabetes mellitus without complication ([place])          -Complete examination performed.  -Xrays reviewed -Discussed with patient in detail the condition of plantar fasciitis severe, how this occurs and general treatment options. Explained both conservative and surgical treatments.  -Rx CAM boot.  -Rx Prednisone dose pack and advised patient that her sugars may elevate a little while on PO steroid  -Recommend patient to ice affected area 1-2x daily. -Patient to return to office in 2 weeks for follow up or sooner if problems or questions arise.  Landis Martins, DPM

## 2019-11-10 ENCOUNTER — Other Ambulatory Visit: Payer: Self-pay | Admitting: Allergy & Immunology

## 2019-11-22 ENCOUNTER — Other Ambulatory Visit: Payer: Self-pay | Admitting: Allergy & Immunology

## 2019-11-24 ENCOUNTER — Encounter: Payer: Self-pay | Admitting: Neurology

## 2019-11-24 ENCOUNTER — Ambulatory Visit: Payer: Medicare Other | Admitting: Neurology

## 2019-11-24 VITALS — BP 140/76 | HR 106 | Wt 199.0 lb

## 2019-11-24 DIAGNOSIS — G4733 Obstructive sleep apnea (adult) (pediatric): Secondary | ICD-10-CM | POA: Diagnosis not present

## 2019-11-24 DIAGNOSIS — Z9989 Dependence on other enabling machines and devices: Secondary | ICD-10-CM

## 2019-11-24 NOTE — Patient Instructions (Addendum)
Please continue using your autoPAP regularly. While your insurance requires that you use PAP at least 4 hours each night on 70% of the nights, I recommend, that you not skip any nights and use it throughout the night if you can. Getting used to PAP and staying with the treatment long term does take time and patience and discipline. Untreated obstructive sleep apnea when it is moderate to severe can have an adverse impact on cardiovascular health and raise her risk for heart disease, arrhythmias, hypertension, congestive heart failure, stroke and diabetes. Untreated obstructive sleep apnea causes sleep disruption, nonrestorative sleep, and sleep deprivation. This can have an impact on your day to day functioning and cause daytime sleepiness and impairment of cognitive function, memory loss, mood disturbance, and problems focussing. Using PAP regularly can improve these symptoms.  Keep up the good work!  For sleep apnea checkup, you can see Amy Lomax in about a year from now, if you need an appointment for migraine management to see Dr. Jaynee Eagles or Amy in the meantime, please make a separate appointment.

## 2019-11-24 NOTE — Progress Notes (Signed)
Subjective:    Patient ID: Tabitha Oneal is a 64 y.o. female.  HPI     Interim history:   Tabitha Oneal is a 64 year old right-handed woman with an underlying medical history of hypertension, hyperlipidemia, hepatitis C, migraine headaches, reflux disease, chronic back pain, asthma, diabetes, osteoarthritis, s/p L TKA and residual knee pain, seasonal allergies, and obesity, who Presents for follow-up consultation of her obstructive sleep apnea, after recent home sleep testing and starting AutoPap therapy.  The patient is unaccompanied today.  I first met her at the request of Debbora Presto, nurse practitioner on 08/24/2019, at which time she reported sleep disruption in the morning headaches as well as snoring.  She was advised to proceed with sleep testing.  She had a home sleep test on 09/14/2019 which indicated severe obstructive sleep apnea with an AHI of 30.9/h, O2 nadir of 83%.  She was advised to proceed with treatment at home in the form of AutoPap.  Today, 11/24/2019: I reviewed her AutoPap compliance data from 10/24/2019 through 11/22/2019, which is a total of 30 days, during which time she used her machine 27 days with percent used days greater than 4 hours at 73%, indicating adequate compliance with an average usage of 5 hours and 7 minutes, residual AHI at goal at 0.4/h, pressure for the 95th percentile at 10.7 cm, range of 7 to 13 cm with EPR.  Leak in the high range especially in the past several days with the 95th percentile at 41.2 L/min.  She reports that she uses a full facemask.  She has noticed a leak.  She feels somewhat improved with regards to her headaches and daytime symptoms but reports that she goes to bed late and does not tend to sleep consistently, has some sleep disruption still.  She is generally in bed between 1 and 2 and as late as 3 AM.  She tries to sleep till 9 AM.  She is currently suffering from left heel spur flareup and left knee pain.  She is wearing a brace  around the left knee and a boot on the left foot.  She reports that she is still adjusting to treatment but she is motivated to continue with it.  The patient's allergies, current medications, family history, past medical history, past social history, past surgical history and problem list were reviewed and updated as appropriate.   Previously:   08/24/19: (She) reports snoring and some sleep disruption, occasional AM headaches. I reviewed your office note from 08/15/2019.  Her Epworth sleepiness score is 6 out of 24, fatigue severity score is 42 out of 63.  She is widowed, she lives alone, has 4 grown children.  She has nocturia about 2-3 times per average night, has had occasional morning headaches.  She is not aware of any family history of OSA, reports that perhaps her daughter has been tested for sleep apnea.  She has a TV in the bedroom and tries to turn it off before falling asleep, generally in bed between 10 and 11 and rise time is 7:53 AM, sometimes 930 or 10.  She is a non-smoker and does not drink alcohol, drinks caffeine in the form of coffee and soda and tea, unclear about the exact amount, not everything every day.  She is working on weight loss.  She would not like to come in for sleep study.  She would consider a home sleep test.  She is not sure if she would be able to use a CPAP machine  when I described to her.   Her Past Medical History Is Significant For: Past Medical History:  Diagnosis Date  . Asthma    QVAR daily and Albuterol as needed  . Bladder infection    taking Keflex daily   . Chronic back pain    reason unknown  . Diabetes mellitus    takes Metformin and Tradjenta daily  . GERD (gastroesophageal reflux disease)    takes Omeprazole daily  . Hard of hearing   . Headache    daily.Takes Excedrine daily  . Hepatitis C   . History of blood transfusion    no abnormal reaction noted  . Hyperlipidemia    takes Atorvastatin daily  . Hypertension    takes Quinapril  daily  . Joint pain   . Joint swelling   . Nocturia   . Obesity   . Osteoarthritis of left knee 02/23/2012  . Seasonal allergies    takes Claritin and Singulair daily.Nasal spray as needed  . Shortness of breath dyspnea    with exertion    Her Past Surgical History Is Significant For: Past Surgical History:  Procedure Laterality Date  . ABDOMINAL HYSTERECTOMY    . INJECTION KNEE  04/09/2012   Procedure: KNEE INJECTION;  Surgeon: Johnny Bridge, MD;  Location: [facility];  Service: Orthopedics;  Laterality: Left;  . JOINT REPLACEMENT     acl   bil knees  . KNEE ARTHROTOMY Left 08/28/2015   Procedure: LEFT KNEE ARTHROFIBROSIS EXCISION; pylectomy;  Surgeon: Meredith Pel, MD;  Location: [facility];  Service: Orthopedics;  Laterality: Left;  . KNEE CLOSED REDUCTION  04/09/2012   Procedure: CLOSED MANIPULATION KNEE;  Surgeon: Johnny Bridge, MD;  Location: [facility];  Service: Orthopedics;  Laterality: Left;  Manipulation Knee with Anesthesia includes Application of Traction   . TOTAL KNEE ARTHROPLASTY  02/23/2012   Procedure: TOTAL KNEE ARTHROPLASTY;  Surgeon: Johnny Bridge, MD;  Location: [facility];  Service: Orthopedics;  Laterality: Left;    Her Family History Is Significant For: Family History  Problem Relation Age of Onset  . Bronchitis Father 39  . Alzheimer's disease Mother   . Ovarian cancer Mother   . Stomach cancer Mother   . High blood pressure Sister   . High blood pressure Sister   . Headache Sister        pt thinks   . High blood pressure Sister   . High blood pressure Sister        pt thinks  . Breast cancer Sister   . High blood pressure Sister   . High blood pressure Sister   . Headache Daughter   . Diabetes Daughter   . Diabetes Daughter   . Diabetes Other   . Colon cancer Neg Hx   . Esophageal cancer Neg Hx   . Rectal cancer Neg Hx     Her Social History Is Significant For: Social History   Socioeconomic History  .  Marital status: Widowed    Spouse name: Not on file  . Number of children: 4  . Years of education: 20  . Highest education level: Some college, no degree  Occupational History  . Not on file  Tobacco Use  . Smoking status: Never Smoker  . Smokeless tobacco: Never Used  Vaping Use  . Vaping Use: Never used  Substance and Sexual Activity  . Alcohol use: No    Alcohol/week: 0.0 standard drinks  . Drug use: No  .  Sexual activity: Yes    Birth control/protection: Surgical  Other Topics Concern  . Not on file  Social History Narrative   Diet: none   Do you drink/eat things with caffeine ? Coffee    Material status: widow     What year were you married? 08/01/1978   Do you live in a house, apartment, assisted living,condo, trailer,ect.)? Townhouse (temporary)    Is it one or more stories? Yes   How many persons live in your home? Two   Do you have any pets in your home ? Yes, Shih-tzu   Current or past profession: none   Do you exercise? No  Type & how often: no   Do you have a living will ? No   Do you have a DNR form? No   If not, do you want to discuss one?  Not now   Do you have signed POA /HPOA forms? No    If so, please bring to your appointment.            Update 12/13/2018: pt states she doesn't drink much coffee, tea   She lives alone                     Social Determinants of Health   Financial Resource Strain:   . Difficulty of Paying Living Expenses:   Food Insecurity:   . Worried About Charity fundraiser in the Last Year:   . Arboriculturist in the Last Year:   Transportation Needs:   . Film/video editor (Medical):   Marland Kitchen Lack of Transportation (Non-Medical):   Physical Activity:   . Days of Exercise per Week:   . Minutes of Exercise per Session:   Stress:   . Feeling of Stress :   Social Connections:   . Frequency of Communication with Friends and Family:   . Frequency of Social Gatherings with Friends and Family:   . Attends Religious Services:    . Active Member of Clubs or Organizations:   . Attends Archivist Meetings:   Marland Kitchen Marital Status:     Her Allergies Are:  No Known Allergies:   Her Current Medications Are:  Outpatient Encounter Medications as of 11/24/2019  Medication Sig  . Albuterol Sulfate (PROAIR RESPICLICK) 476 (90 Base) MCG/ACT AEPB Inhale 4 puffs into the lungs every 4 (four) hours as needed.  Marland Kitchen amLODipine (NORVASC) 5 MG tablet Take 10 mg by mouth daily.  Marland Kitchen azelastine (ASTELIN) 0.1 % nasal spray USE 1 SPRAY EACH NOSTRIL TWICE DAILY  . BREO ELLIPTA 200-25 MCG/INH AEPB INHALE 1 PUFF BY MOUTH EVERY DAY  . diclofenac sodium (VOLTAREN) 1 % GEL Apply 8 g topically 2 (two) times daily as needed. On knees  . ferrous sulfate 325 (65 FE) MG tablet TAKE 1 TABLET BY MOUTH EVERY DAY WITH BREAKFAST  . fluticasone (FLONASE) 50 MCG/ACT nasal spray SPRAY 2 SPRAYS INTO EACH NOSTRIL EVERY DAY  . gabapentin (NEURONTIN) 100 MG capsule Take 100 mg by mouth 3 (three) times daily.  Marland Kitchen ipratropium (ATROVENT) 0.03 % nasal spray Place 2 sprays into both nostrils 2 (two) times daily.  . metFORMIN (GLUCOPHAGE) 500 MG tablet Take 1 tablet (500 mg total) by mouth 2 (two) times daily with a meal. APPOINTMENT OVERDUE  . montelukast (SINGULAIR) 10 MG tablet TAKE 1 TABLET BY MOUTH EVERYDAY AT BEDTIME  . omeprazole (PRILOSEC) 40 MG capsule TAKE 1 CAPSULE BY MOUTH EVERY DAY  . ONE TOUCH  ULTRA TEST test strip Check blood sugar once daily as directed  . predniSONE (STERAPRED UNI-PAK 21 TAB) 10 MG (21) TBPK tablet Take as directed  . quinapril (ACCUPRIL) 40 MG tablet Take 1 tablet (40 mg total) by mouth at bedtime.  . rosuvastatin (CRESTOR) 5 MG tablet TAKE 1 TABLET BY MOUTH EVERY DAY  . tiZANidine (ZANAFLEX) 4 MG tablet Take 1 tablet (4 mg total) by mouth every 6 (six) hours as needed for muscle spasms. For headaches.   No facility-administered encounter medications on file as of 11/24/2019.  :  Review of Systems:  Out of a complete 14 point  review of systems, all are reviewed and negative with the exception of these symptoms as listed below:  Review of Systems  Neurological:       Here for f/u on autopap. Reports machine is working well for. Pt reports she is feeling much better.    Objective:  Neurological Exam  Physical Exam Physical Examination:   Vitals:   11/24/19 1253  BP: 140/76  Pulse: (!) 106  SpO2: 98%    General Examination: The patient is a very pleasant 64 y.o. female in no acute distress. She appears well-developed and well-nourished and well groomed.   HEENT: Normocephalic, atraumatic, pupils are equal, round and reactive to light, extraocular tracking is good without limitation to gaze excursion or nystagmus noted. Hearing is impaired, no hearing aids. Face is symmetric with normal facial animation. Speech is clear with no dysarthria noted. There is no hypophonia. There is no lip, neck/head, jaw or voice tremor. Neck is supple with full range of passive and active motion. There are no carotid bruits on auscultation. Oropharynx exam reveals: moderate mouth dryness, dentures on top and multiple missing teeth on the bottom, moderate airway crowding secondary to redundant soft palate, tonsils on the smaller side, Mallampati class II, tongue protrudes centrally and palate elevates symmetrically.    Chest: Clear to auscultation without wheezing, rhonchi or crackles noted.  Heart: S1+S2+0, possible slight systolic heart murmur noted, no irregularity.   Abdomen: Soft, non-tender and non-distended.  Extremities: There is no pitting edema in the distal lower extremities bilaterally.   Skin: Warm and dry without trophic changes noted.   Musculoskeletal: exam reveals L knee brace and L foot boot.    Neurologically:  Mental status: The patient is awake, alert and oriented in all 4 spheres. Her immediate and remote memory, attention, language skills and fund of knowledge are appropriate. There is no  evidence of aphasia, agnosia, apraxia or anomia. Speech is clear with normal prosody and enunciation. Thought process is linear. Mood is normal and affect is normal.  Cranial nerves II - XII are as described above under HEENT exam.  Motor exam: Normal bulk, strength and tone is noted. There is no tremor, fine motor skills and coordination: grossly intact.  Cerebellar testing: No dysmetria or intention tremor. There is no truncal or gait ataxia.  Sensory exam: intact to light touch in the upper and lower extremities.  Gait, station and balance: She stands with difficulty.  She walks with a cane, she has a significant limp on the left.   Assessment and Plan:   In summary, Tabitha Oneal is a very pleasant 64 y.o.-year old female with an underlying medical history of hypertension, hyperlipidemia, hepatitis C, migraine headaches, reflux disease, chronic back pain, asthma, diabetes, osteoarthritis, s/p L TKA and residual knee pain,  left foot pain, seasonal allergies, and obesity, who presents for follow-up consultation of her  obstructive sleep apnea after home sleep testing indicated severe obstructive sleep apnea in April 2021.  She has established treatment with AutoPap at home.  She is compliant with treatment but is still adapting to it.  She is encouraged to stay on it longer if possible and work on her sleep habits and trying to keep a set schedule.  We talked about the importance of good sleep hygiene.  She has no appointment pending for her migraine management, she continues to have intermittent headaches.  She is encouraged to make a follow-up appointment in headache clinic.  From the sleep apnea standpoint, I would like for her to seek a mask refit appointment with her DME company as the mask is leaking, more noticeable in the past nearly 10 days.  She also had questions regarding her supplies and how often she could change them.  She is advised to refer back to her DME company regarding  this and how often her insurance would pay for supplies but generally speaking a full facemask can be replaced approximately every 3 months and the filter should be replaced every month.  She is advised to follow-up in sleep clinic to see Amy in about 1 year, sooner if needed.  If she needs a follow-up for headache management she has to make that appointment separately, I advised her.  In addition, she may have had a slight heart murmur, she has no symptoms and reports that she has never been told she had a heart murmur, in reviewing her cardiology follow-up appointment from October 2020 she was noted to have a 2/6 systolic murmur. She reports that she may have missed a follow-up appointment with her cardiologist and she is encouraged to make that appointment.  I answered all her questions today and she was in agreement with the plan.  I spent 30 minutes in total face-to-face time and in reviewing records during pre-charting, more than 50% of which was spent in counseling and coordination of care, reviewing test results, reviewing medications and treatment regimen and/or in discussing or reviewing the diagnosis of OSA, the prognosis and treatment options. Pertinent laboratory and imaging test results that were available during this visit with the patient were reviewed by me and considered in my medical decision making (see chart for details).

## 2019-11-27 ENCOUNTER — Other Ambulatory Visit: Payer: Self-pay | Admitting: Nurse Practitioner

## 2019-11-27 DIAGNOSIS — E785 Hyperlipidemia, unspecified: Secondary | ICD-10-CM

## 2019-12-01 ENCOUNTER — Ambulatory Visit: Payer: Medicare Other | Admitting: Allergy & Immunology

## 2019-12-13 ENCOUNTER — Other Ambulatory Visit: Payer: Self-pay

## 2019-12-13 ENCOUNTER — Encounter: Payer: Self-pay | Admitting: Sports Medicine

## 2019-12-13 ENCOUNTER — Ambulatory Visit (INDEPENDENT_AMBULATORY_CARE_PROVIDER_SITE_OTHER): Payer: Medicare Other | Admitting: Sports Medicine

## 2019-12-13 ENCOUNTER — Telehealth: Payer: Self-pay | Admitting: *Deleted

## 2019-12-13 DIAGNOSIS — M2550 Pain in unspecified joint: Secondary | ICD-10-CM

## 2019-12-13 DIAGNOSIS — M779 Enthesopathy, unspecified: Secondary | ICD-10-CM

## 2019-12-13 DIAGNOSIS — M722 Plantar fascial fibromatosis: Secondary | ICD-10-CM

## 2019-12-13 DIAGNOSIS — M545 Low back pain, unspecified: Secondary | ICD-10-CM

## 2019-12-13 DIAGNOSIS — I739 Peripheral vascular disease, unspecified: Secondary | ICD-10-CM | POA: Diagnosis not present

## 2019-12-13 DIAGNOSIS — E119 Type 2 diabetes mellitus without complications: Secondary | ICD-10-CM

## 2019-12-13 DIAGNOSIS — M25562 Pain in left knee: Secondary | ICD-10-CM

## 2019-12-13 MED ORDER — MELOXICAM 15 MG PO TABS: 15.0000 mg | ORAL_TABLET | Freq: Every day | ORAL | 0 refills | Status: DC

## 2019-12-13 NOTE — Progress Notes (Signed)
Subjective: Tabitha Oneal is a 64 y.o. female patient returns to office with complaint of heel pain on the left reports that the prednisone seemed to help has intense pain in her knee and back reports that the boot helps off and on on her left foot and she is also starting to get some discomfort that is minor in the right foot as well with some occasional swelling.  Patient is very emotional this visit because she really is having a hard time understanding why she is having so much pain all over her body every day.  Patient reports that her lifestyle is extremely limited because of the pain.  Patient Active Problem List   Diagnosis Date Noted  . Pain in left knee 09/13/2019  . 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 Abnormal EKG 03/02/2019  . Incomplete left bundle branch block (LBBB) 03/02/2019  . Rebound headache 12/13/2018  . Overuse of medication 12/13/2018  . Intractable chronic migraine without aura and without status migrainosus 12/13/2018  . Microcytic anemia 02/24/2018  . High risk medication use 08/06/2017  . Seasonal and perennial allergic rhinitis 07/30/2017  . Recurrent infections 07/30/2017  . Cough 06/08/2017  . Moderate persistent asthma without complication 27/25/3664  . Chronic constipation 02/04/2017  . Hyperlipidemia due to type 2 diabetes mellitus ([place]) 02/04/2017  . Hallux rigidus, left foot 01/01/2017  . Postmenopausal symptoms 02/22/2016  . Arthrofibrosis of knee joint 08/28/2015  . Left hip pain 10/08/2014  . Type 2 diabetes mellitus without complication, without long-term current use of insulin ([place]) 10/08/2014  . Hyperlipidemia LDL goal <100 10/08/2014  . Essential hypertension, benign 08/30/2014  . Diabetes mellitus, type 2 ([place]) 08/30/2014  . Diverticulosis of colon without hemorrhage 08/30/2014  . Chronic low back pain 08/30/2014  . Chronic pain syndrome 08/30/2014  . Asthma in adult without complication 40/34/7425   . 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 Osteoarthritis of left knee 02/23/2012    Current Outpatient Medications on File Prior to Visit  Medication Sig Dispense Refill  . Albuterol Sulfate (PROAIR RESPICLICK) 956 (90 Base) MCG/ACT AEPB Inhale 4 puffs into the lungs every 4 (four) hours as needed. 1 each 1  . amLODipine (NORVASC) 5 MG tablet Take 10 mg by mouth daily.    Marland Kitchen azelastine (ASTELIN) 0.1 % nasal spray USE 1 SPRAY EACH NOSTRIL TWICE DAILY 30 mL 5  . BREO ELLIPTA 200-25 MCG/INH AEPB INHALE 1 PUFF BY MOUTH EVERY DAY 60 each 5  . diclofenac sodium (VOLTAREN) 1 % GEL Apply 8 g topically 2 (two) times daily as needed. On knees    . ferrous sulfate 325 (65 FE) MG tablet TAKE 1 TABLET BY MOUTH EVERY DAY WITH BREAKFAST 90 tablet 1  . fluticasone (FLONASE) 50 MCG/ACT nasal spray SPRAY 2 SPRAYS INTO EACH NOSTRIL EVERY DAY 48 mL 0  . gabapentin (NEURONTIN) 100 MG capsule Take 100 mg by mouth 3 (three) times daily.    Marland Kitchen ipratropium (ATROVENT) 0.03 % nasal spray Place 2 sprays into both nostrils 2 (two) times daily. 90 mL 1  . metFORMIN (GLUCOPHAGE) 500 MG tablet Take 1 tablet (500 mg total) by mouth 2 (two) times daily with a meal. APPOINTMENT OVERDUE 60 tablet 0  . montelukast (SINGULAIR) 10 MG tablet TAKE 1 TABLET BY MOUTH EVERYDAY AT BEDTIME 90 tablet 5  . omeprazole (PRILOSEC) 40 MG capsule TAKE 1 CAPSULE BY MOUTH EVERY DAY 90 capsule 1  . ONE TOUCH ULTRA TEST  test strip Check blood sugar once daily as directed  3  . predniSONE (STERAPRED UNI-PAK 21 TAB) 10 MG (21) TBPK tablet Take as directed 21 tablet 0  . quinapril (ACCUPRIL) 40 MG tablet Take 1 tablet (40 mg total) by mouth at bedtime. 90 tablet 1  . rosuvastatin (CRESTOR) 5 MG tablet TAKE 1 TABLET BY MOUTH EVERY DAY 30 tablet 5  . tiZANidine (ZANAFLEX) 4 MG tablet Take 1 tablet (4 mg total) by mouth every 6 (six) hours as needed for muscle spasms. For headaches. 90 tablet 3  . topiramate (TOPAMAX) 50 MG  tablet Take 50 mg by mouth 2 (two) times daily.     No current facility-administered medications on file prior to visit.    No Known Allergies  Objective: Physical Exam General: The patient is alert and oriented x3 in no acute distress.  Dermatology: Skin is warm, dry and supple bilateral lower extremities. Nails 1-10 are normal. There is no erythema, no eccymosis, no open lesions present. Integument is otherwise unremarkable.  Vascular: Dorsalis Pedis pulse and Posterior Tibial pulse are 1/4 bilateral. Capillary fill time is immediate to all digits.  Neurological: Grossly intact to light touch bilateral.  Musculoskeletal: Moderate tenderness to palpation at the medial calcaneal tubercale and through the insertion of the plantar fascia left greater than right and achilles on left. There is decreased Ankle joint range of motion bilateral worse on left, + pes planus. Guarding due to pain with extension of left leg there is pain at the knee as well as the low back.    Assessment and Plan: Problem List Items Addressed This Visit      Other   Pain in left knee    Other Visit Diagnoses    Plantar fasciitis of left foot    -  Primary   Tendonitis       PVD (peripheral vascular disease) (HCC)       Arthralgia, unspecified joint       Low back pain at multiple sites       Relevant Medications   meloxicam (MOBIC) 15 MG tablet      -Complete examination performed.  -Re-discussed with patient in detail the condition of plantar fasciitis and tendinitis severe, how this occurs and general treatment options. Explained both conservative and surgical treatments as well as inform patient that likely her pain is worsened by her left knee problem which is also likely affecting her gait and her back.  -Rx meloxicam to take as instructed -Rx physical therapy at [facility] and therapy to work with her to try to get her out of the boot slowly as she can tolerate -Applied heel lift on right  shoe -Recommend patient to ice affected area 1-2x daily. -Patient to return to office in 3-4 weeks for follow up or sooner if problems or questions arise.  Advised patient depending on the results of her PT visits may benefit from going to a doctor about her back as well as following up with her orthopedic doctor about her knee  Landis Martins, DPM

## 2019-12-13 NOTE — Telephone Encounter (Signed)
-----   Message from [place], [place] sent at 12/13/2019 10:30 AM EDT ----- Regarding: PT with [facility] Multiple joint pains Left fasciitis Knee pain with history of multiple surgeries cant extent leg Low back pain

## 2019-12-13 NOTE — Telephone Encounter (Signed)
Faxed required form, SnapShot with demographics and clinicals to [facility].

## 2019-12-15 ENCOUNTER — Other Ambulatory Visit: Payer: Self-pay | Admitting: Nurse Practitioner

## 2019-12-15 DIAGNOSIS — E785 Hyperlipidemia, unspecified: Secondary | ICD-10-CM

## 2019-12-22 ENCOUNTER — Other Ambulatory Visit: Payer: Self-pay

## 2019-12-22 ENCOUNTER — Encounter: Payer: Self-pay | Admitting: Allergy & Immunology

## 2019-12-22 ENCOUNTER — Ambulatory Visit: Payer: Medicare Other | Admitting: Allergy & Immunology

## 2019-12-22 VITALS — BP 114/70 | HR 80 | Resp 18 | Ht 62.0 in

## 2019-12-22 DIAGNOSIS — J4540 Moderate persistent asthma, uncomplicated: Secondary | ICD-10-CM

## 2019-12-22 DIAGNOSIS — K219 Gastro-esophageal reflux disease without esophagitis: Secondary | ICD-10-CM | POA: Diagnosis not present

## 2019-12-22 DIAGNOSIS — J302 Other seasonal allergic rhinitis: Secondary | ICD-10-CM | POA: Diagnosis not present

## 2019-12-22 DIAGNOSIS — J3089 Other allergic rhinitis: Secondary | ICD-10-CM | POA: Diagnosis not present

## 2019-12-22 MED ORDER — PROAIR RESPICLICK 108 (90 BASE) MCG/ACT IN AEPB: 4.0000 | INHALATION_SPRAY | RESPIRATORY_TRACT | 1 refills | Status: DC | PRN

## 2019-12-22 MED ORDER — IPRATROPIUM BROMIDE 0.03 % NA SOLN: 2.0000 | Freq: Two times a day (BID) | NASAL | 1 refills | Status: DC

## 2019-12-22 NOTE — Progress Notes (Signed)
FOLLOW UP  Date of Service/Encounter:  12/22/19   Assessment:   Moderate persistent asthma, uncomplicated - very stable  Seasonal and perennial allergic rhinitis(trees, mold, dust mite)  Gastroesophageal reflux disease   Pollen is doing very well on the current regimen. I think at this point, we could stepdown therapy. However, she is hesitant to stepdown therapy since she has been so stable. She would prefer to stay on the same medications, which is completely understandable. Her allergic rhinitis is controlled as well as her reflux. Unfortunately, her quality of life is limited by her left knee injury. Evidently there is nothing else they can do about the pain and she is now on several pain medications to try to deal with it. I did recommend that she consider some more alternative treatment plan since the more traditional methods did not work, such as acupuncture or prolotherapy.  Plan/Recommendations:   1. Moderate persistent asthma, uncomplicated - It seems that your asthma medications are working well at this time.  - We will not make any medication changes since you are doing so well.  - Daily controller medication(s): Breo 200/13mcg one puff once daily - Prior to physical activity: ProAir 2 puffs 10-15 minutes before physical activity. - Rescue medications: ProAir 4 puffs every 4-6 hours as needed or albuterol nebulizer one vial every 4-6 hours as needed - Asthma control goals:  * Full participation in all desired activities (may need albuterol before activity) * Albuterol use two time or less a week on average (not counting use with activity) * Cough interfering with sleep two time or less a month * Oral steroids no more than once a year * No hospitalizations  2. Chronic rhinitis - Continue with nasal ipratropium one spray per nostril every six hours as needed. - Continue with fluticasone nasal spray two sprays per nostril daily. - Continue with Singulair 10mg   daily.  3. GERD  - Controlled without reflux medications.   4. Return in about 6 months (around 06/23/2020). This can be an in-person, a virtual Webex or a telephone follow up visit.   Subjective:   Tabitha Oneal is a 64 y.o. female presenting today for follow up of  Chief Complaint  Patient presents with  . Asthma    Tabitha Oneal has a history of the following: Patient Active Problem List   Diagnosis Date Noted  . Pain in left knee 09/13/2019  . 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 Abnormal EKG 03/02/2019  . Incomplete left bundle branch block (LBBB) 03/02/2019  . Rebound headache 12/13/2018  . Overuse of medication 12/13/2018  . Intractable chronic migraine without aura and without status migrainosus 12/13/2018  . Microcytic anemia 02/24/2018  . High risk medication use 08/06/2017  . Seasonal and perennial allergic rhinitis 07/30/2017  . Recurrent infections 07/30/2017  . Cough 06/08/2017  . Moderate persistent asthma without complication 42/68/3419  . Chronic constipation 02/04/2017  . Hyperlipidemia due to type 2 diabetes mellitus ([place]) 02/04/2017  . Hallux rigidus, left foot 01/01/2017  . Postmenopausal symptoms 02/22/2016  . Arthrofibrosis of knee joint 08/28/2015  . Left hip pain 10/08/2014  . Type 2 diabetes mellitus without complication, without long-term current use of insulin ([place]) 10/08/2014  . Hyperlipidemia LDL goal <100 10/08/2014  . Essential hypertension, benign 08/30/2014  . Diabetes mellitus, type 2 ([place]) 08/30/2014  . Diverticulosis of colon without hemorrhage 08/30/2014  . Chronic low back pain 08/30/2014  . Chronic pain syndrome 08/30/2014  .  Asthma in adult without complication 12/75/1700  . 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 Osteoarthritis of left knee 02/23/2012    History obtained from: chart review and patient.  Anastasha is a 64 y.o. female presenting for a  follow up visit. She was last seen in January 2021. At that time, her asthma was under good control with Breo 200/25 mcg 1 puff once daily as well as albuterol as needed. For her rhinitis, she was continued on nasal Atrovent as well as Flonase 1 to 2 sprays per nostril daily. She was also on Singulair 10 mg daily. Reflux was controlled with her medications.  Since last visit, she has done very well.  Asthma/Respiratory Symptom History: She remains on Breo 200/25 mcg 1 puff once daily. Tabitha Oneal's asthma has been well controlled. She has not required rescue medication, experienced nocturnal awakenings due to lower respiratory symptoms, nor have activities of daily living been limited. She has required no Emergency Department or Urgent Care visits for her asthma. She has required zero courses of systemic steroids for asthma exacerbations since the last visit. ACT score today is 25, indicating excellent asthma symptom control.   Allergic Rhinitis Symptom History: She remains on her nasal sprays. She does not use 1 every day. She uses both of them as needed. She does remain on her Singulair 10 mg daily. She has not needed antibiotics at all since last visit.  GERD Symptom History: She remains on all of her medications. She does have slight dietary avoidances to prevent worsening reflux, but otherwise she has been very well controlled.  She did get her two COVID19 vaccinations. She did not have a reaction at all.   Otherwise, there have been no changes to her past medical history, surgical history, family history, or social history.    Review of Systems  Constitutional: Negative.  Negative for chills, fever, malaise/fatigue and weight loss.  HENT: Negative for congestion, ear discharge, ear pain and sinus pain.   Eyes: Negative for pain, discharge and redness.  Respiratory: Negative for cough, sputum production, shortness of breath and wheezing.   Cardiovascular: Negative.  Negative for chest pain and  palpitations.  Gastrointestinal: Negative for abdominal pain, constipation, diarrhea, heartburn, nausea and vomiting.  Skin: Negative.  Negative for itching and rash.  Neurological: Negative for dizziness and headaches.  Endo/Heme/Allergies: Positive for environmental allergies. Does not bruise/bleed easily.       Objective:   Blood pressure 114/70, pulse 80, resp. rate 18, height 5\' 2"  (1.575 m), SpO2 99 %. Body mass index is 36.4 kg/m.   Physical Exam:  Physical Exam Constitutional:      Appearance: She is well-developed.  HENT:     Head: Normocephalic and atraumatic.     Comments: Talkative female. Hard of hearing.    Right Ear: Tympanic membrane, ear canal and external ear normal.     Left Ear: Tympanic membrane and ear canal normal.     Nose: No nasal deformity, septal deviation, mucosal edema or rhinorrhea.     Right Sinus: No maxillary sinus tenderness or frontal sinus tenderness.     Left Sinus: No maxillary sinus tenderness or frontal sinus tenderness.     Comments: Turbinates normal in size.    Mouth/Throat:     Mouth: Mucous membranes are not pale and not dry.     Pharynx: Uvula midline.  Eyes:     General:        Right eye: No discharge.  Left eye: No discharge.     Conjunctiva/sclera: Conjunctivae normal.     Right eye: Right conjunctiva is not injected. No chemosis.    Left eye: Left conjunctiva is not injected. No chemosis.    Pupils: Pupils are equal, round, and reactive to light.  Cardiovascular:     Rate and Rhythm: Normal rate and regular rhythm.     Heart sounds: Normal heart sounds.  Pulmonary:     Effort: Pulmonary effort is normal. No tachypnea, accessory muscle usage or respiratory distress.     Breath sounds: Normal breath sounds. No wheezing, rhonchi or rales.     Comments: Moving air well in all lung fields. Chest:     Chest wall: No tenderness.  Musculoskeletal:     Comments: She does have a large brace in place on her left knee.  She is using a cane.  Lymphadenopathy:     Cervical: No cervical adenopathy.  Skin:    Coloration: Skin is not pale.     Findings: No abrasion, erythema, petechiae or rash. Rash is not papular, urticarial or vesicular.  Neurological:     Mental Status: She is alert.      Diagnostic studies:    Spirometry: results normal (FEV1: 1.93/153%, FVC: 2.53/154%, FEV1/FVC: 76%).    Spirometry consistent with normal pattern. Her percentages were off since her height was placed into the system incorrectly, but her absolute numbers are perfect.   Allergy Studies: none        Salvatore Marvel, MD  [facility]

## 2019-12-22 NOTE — Patient Instructions (Addendum)
1. Moderate persistent asthma, uncomplicated - It seems that your asthma medications are working well at this time.  - We will not make any medication changes since you are doing so well.  - Daily controller medication(s): Breo 200/22mcg one puff once daily - Prior to physical activity: ProAir 2 puffs 10-15 minutes before physical activity. - Rescue medications: ProAir 4 puffs every 4-6 hours as needed or albuterol nebulizer one vial every 4-6 hours as needed - Asthma control goals:  * Full participation in all desired activities (may need albuterol before activity) * Albuterol use two time or less a week on average (not counting use with activity) * Cough interfering with sleep two time or less a month * Oral steroids no more than once a year * No hospitalizations  2. Chronic rhinitis - Continue with nasal ipratropium one spray per nostril every six hours as needed. - Continue with fluticasone nasal spray two sprays per nostril daily. - Continue with Singulair 10mg  daily.  3. GERD  - Controlled without reflux medications.   4. Return in about 6 months (around 06/23/2020). This can be an in-person, a virtual Webex or a telephone follow up visit.    Consider acupuncture or prolotherapy for your knee pain!    Please inform us of any Emergency Department visits, hospitalizations, or changes in symptoms. Call us before going to the ED for breathing or allergy symptoms since we might be able to fit you in for a sick visit. Feel free to contact us anytime with any questions, problems, or concerns.  It was a pleasure to see you again today!  Websites that have reliable patient information: 1. American Academy of Asthma, Allergy, and Immunology: www.aaaai.org 2. Food Allergy Research and Education (FARE): foodallergy.org 3. Mothers of Asthmatics: http://www.asthmacommunitynetwork.org 4. American College of Allergy, Asthma, and Immunology: www.acaai.org   COVID-19 Vaccine Information can  be found at: ShippingScam.co.uk For questions related to vaccine distribution or appointments, please email vaccine@[email or web address] .com or call [phone].     "Like" [place] on Facebook and Instagram for our latest updates!        Make sure you are registered to vote! If you have moved or changed any of your contact information, you will need to get this updated before voting!  In some cases, you MAY be able to register to vote online: [email or web address]

## 2019-12-23 ENCOUNTER — Telehealth: Payer: Self-pay | Admitting: *Deleted

## 2019-12-23 DIAGNOSIS — M545 Low back pain, unspecified: Secondary | ICD-10-CM

## 2019-12-23 DIAGNOSIS — M722 Plantar fascial fibromatosis: Secondary | ICD-10-CM

## 2019-12-23 DIAGNOSIS — M779 Enthesopathy, unspecified: Secondary | ICD-10-CM

## 2019-12-23 DIAGNOSIS — M2550 Pain in unspecified joint: Secondary | ICD-10-CM

## 2019-12-23 DIAGNOSIS — M25562 Pain in left knee: Secondary | ICD-10-CM

## 2019-12-23 NOTE — Telephone Encounter (Signed)
Pt left name and phone number, no message.

## 2019-12-23 NOTE — Telephone Encounter (Signed)
I called pt and she states she has started PT, and she didn't know she had to make co-pay ever visit and she can't do that. I offered to send pt's PT referral to see if [facility] would assist her in some way. Faxed referral, Snap[employer]ot and demographics to [facility].

## 2020-01-05 ENCOUNTER — Other Ambulatory Visit: Payer: Self-pay

## 2020-01-05 ENCOUNTER — Other Ambulatory Visit: Payer: Self-pay | Admitting: Sports Medicine

## 2020-01-05 ENCOUNTER — Encounter: Payer: Self-pay | Admitting: Sports Medicine

## 2020-01-05 ENCOUNTER — Ambulatory Visit: Payer: Medicare Other | Admitting: Sports Medicine

## 2020-01-05 DIAGNOSIS — M2550 Pain in unspecified joint: Secondary | ICD-10-CM | POA: Diagnosis not present

## 2020-01-05 DIAGNOSIS — M545 Low back pain, unspecified: Secondary | ICD-10-CM

## 2020-01-05 DIAGNOSIS — M25562 Pain in left knee: Secondary | ICD-10-CM

## 2020-01-05 DIAGNOSIS — M779 Enthesopathy, unspecified: Secondary | ICD-10-CM

## 2020-01-05 DIAGNOSIS — M722 Plantar fascial fibromatosis: Secondary | ICD-10-CM | POA: Diagnosis not present

## 2020-01-05 DIAGNOSIS — E119 Type 2 diabetes mellitus without complications: Secondary | ICD-10-CM

## 2020-01-05 DIAGNOSIS — I739 Peripheral vascular disease, unspecified: Secondary | ICD-10-CM

## 2020-01-05 MED ORDER — PREDNISONE 10 MG (21) PO TBPK
ORAL_TABLET | ORAL | 0 refills | Status: DC
Start: 2020-01-05 — End: 2020-02-15

## 2020-01-05 NOTE — Patient Instructions (Addendum)
Call one of these doctors to see if they can see you for your back and knee:  [facility] in [place], [place] in: [facility] Address: [address], [place], [place] [address] Phone: [phone]  Or  [facility] Address: [address], [place], [place] [address] Phone: [phone]

## 2020-01-05 NOTE — Progress Notes (Signed)
Subjective: Tabitha Oneal is a 64 y.o. female patient returns to office with complaint of heel pain on the left.  Patient reports that she still is having trouble with some pain as well as pain to her back and hip reports that the right does not bother her as much as the left.  Patient reports that she goes for therapy on the 23rd of this month and still has been using her walker and cam boot which seems to help as well as rest and staying off of her foot seems to help.  Patient denies any other pedal complaints at this time.  Patient Active Problem List   Diagnosis Date Noted  . Pain in left knee 09/13/2019  . 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 Abnormal EKG 03/02/2019  . Incomplete left bundle branch block (LBBB) 03/02/2019  . Rebound headache 12/13/2018  . Overuse of medication 12/13/2018  . Intractable chronic migraine without aura and without status migrainosus 12/13/2018  . Microcytic anemia 02/24/2018  . High risk medication use 08/06/2017  . Seasonal and perennial allergic rhinitis 07/30/2017  . Recurrent infections 07/30/2017  . Cough 06/08/2017  . Moderate persistent asthma without complication 60/73/7106  . Chronic constipation 02/04/2017  . Hyperlipidemia due to type 2 diabetes mellitus ([place]) 02/04/2017  . Hallux rigidus, left foot 01/01/2017  . Postmenopausal symptoms 02/22/2016  . Arthrofibrosis of knee joint 08/28/2015  . Left hip pain 10/08/2014  . Type 2 diabetes mellitus without complication, without long-term current use of insulin ([place]) 10/08/2014  . Hyperlipidemia LDL goal <100 10/08/2014  . Essential hypertension, benign 08/30/2014  . Diabetes mellitus, type 2 ([place]) 08/30/2014  . Diverticulosis of colon without hemorrhage 08/30/2014  . Chronic low back pain 08/30/2014  . Chronic pain syndrome 08/30/2014  . Asthma in adult without complication 26/94/8546  . Contracture of left knee, arthrofibrosis post op TKA 04/09/2012   . Osteoarthritis of left knee 02/23/2012    Current Outpatient Medications on File Prior to Visit  Medication Sig Dispense Refill  . Albuterol Sulfate (PROAIR RESPICLICK) 270 (90 Base) MCG/ACT AEPB Inhale 4 puffs into the lungs every 4 (four) hours as needed. 1 each 1  . amLODipine (NORVASC) 5 MG tablet Take 10 mg by mouth daily.    Marland Kitchen azelastine (ASTELIN) 0.1 % nasal spray USE 1 SPRAY EACH NOSTRIL TWICE DAILY 30 mL 5  . BREO ELLIPTA 200-25 MCG/INH AEPB INHALE 1 PUFF BY MOUTH EVERY DAY 60 each 5  . diclofenac sodium (VOLTAREN) 1 % GEL Apply 8 g topically 2 (two) times daily as needed. On knees    . ferrous sulfate 325 (65 FE) MG tablet TAKE 1 TABLET BY MOUTH EVERY DAY WITH BREAKFAST 90 tablet 1  . fluticasone (FLONASE) 50 MCG/ACT nasal spray SPRAY 2 SPRAYS INTO EACH NOSTRIL EVERY DAY 48 mL 0  . gabapentin (NEURONTIN) 100 MG capsule Take 100 mg by mouth 3 (three) times daily.    Marland Kitchen ipratropium (ATROVENT) 0.03 % nasal spray Place 2 sprays into both nostrils 2 (two) times daily. 90 mL 1  . meloxicam (MOBIC) 15 MG tablet Take 1 tablet (15 mg total) by mouth daily. 30 tablet 0  . metFORMIN (GLUCOPHAGE) 500 MG tablet Take 1 tablet (500 mg total) by mouth 2 (two) times daily with a meal. APPOINTMENT OVERDUE 60 tablet 0  . montelukast (SINGULAIR) 10 MG tablet TAKE 1 TABLET BY MOUTH EVERYDAY AT BEDTIME 90 tablet 5  . omeprazole (PRILOSEC) 40 MG capsule TAKE 1 CAPSULE  BY MOUTH EVERY DAY 90 capsule 1  . ONE TOUCH ULTRA TEST test strip Check blood sugar once daily as directed  3  . quinapril (ACCUPRIL) 40 MG tablet Take 1 tablet (40 mg total) by mouth at bedtime. 90 tablet 1  . rosuvastatin (CRESTOR) 5 MG tablet TAKE 1 TABLET BY MOUTH EVERY DAY 30 tablet 5  . tiZANidine (ZANAFLEX) 4 MG tablet Take 1 tablet (4 mg total) by mouth every 6 (six) hours as needed for muscle spasms. For headaches. 90 tablet 3  . topiramate (TOPAMAX) 50 MG tablet Take 50 mg by mouth 2 (two) times daily.     No current  facility-administered medications on file prior to visit.    No Known Allergies  Objective: Physical Exam General: The patient is alert and oriented x3 in no acute distress.  Dermatology: Skin is warm, dry and supple bilateral lower extremities. Nails 1-10 are normal. There is no erythema, no eccymosis, no open lesions present. Integument is otherwise unremarkable.  Vascular: Dorsalis Pedis pulse and Posterior Tibial pulse are 1/4 bilateral. Capillary fill time is immediate to all digits.  Neurological: Grossly intact to light touch bilateral.  Musculoskeletal: There is tenderness to palpation at the medial calcaneal tubercale and through the insertion of the plantar fascia left greater than right and achilles on left. There is decreased Ankle joint range of motion bilateral worse on left, + pes planus. Guarding due to pain with extension of left leg there is pain at the knee as well as the low back like previous.    Assessment and Plan: Problem List Items Addressed This Visit      Other   Pain in left knee    Other Visit Diagnoses    Plantar fasciitis of left foot    -  Primary   Tendonitis       Arthralgia, unspecified joint       Low back pain at multiple sites       Relevant Medications   predniSONE (STERAPRED UNI-PAK 21 TAB) 10 MG (21) TBPK tablet   PVD (peripheral vascular disease) ([place])       Diabetes mellitus without complication (H[facility])          -Complete examination performed.  -Re-discussed with pain in the setting of chronic left knee pain as well as low back pain -Patient is awaiting physical therapy -Advised patient to continue with rest ice elevation and meloxicam as tolerated -Advised patient to see a spine and knee doctor referred her to [place] or spine specialist and gave her the contact information to contact these offices directly to see if they can see her regarding her knee back -Patient to return to office in 4 to 6 weeks for follow-up  evaluation after she has started therapy or sooner if problems or issues arise. Landis Martins, DPM

## 2020-01-06 ENCOUNTER — Other Ambulatory Visit: Payer: Self-pay | Admitting: Student

## 2020-01-06 DIAGNOSIS — Z780 Asymptomatic menopausal state: Secondary | ICD-10-CM

## 2020-01-16 ENCOUNTER — Encounter: Payer: Self-pay | Admitting: Physical Therapy

## 2020-01-16 ENCOUNTER — Ambulatory Visit: Payer: Medicare Other | Attending: Sports Medicine | Admitting: Physical Therapy

## 2020-01-16 ENCOUNTER — Other Ambulatory Visit: Payer: Self-pay

## 2020-01-16 DIAGNOSIS — M6281 Muscle weakness (generalized): Secondary | ICD-10-CM | POA: Insufficient documentation

## 2020-01-16 DIAGNOSIS — M79672 Pain in left foot: Secondary | ICD-10-CM | POA: Insufficient documentation

## 2020-01-16 DIAGNOSIS — G8929 Other chronic pain: Secondary | ICD-10-CM | POA: Insufficient documentation

## 2020-01-16 DIAGNOSIS — M545 Low back pain, unspecified: Secondary | ICD-10-CM

## 2020-01-16 DIAGNOSIS — M25562 Pain in left knee: Secondary | ICD-10-CM | POA: Insufficient documentation

## 2020-01-16 DIAGNOSIS — R2689 Other abnormalities of gait and mobility: Secondary | ICD-10-CM | POA: Insufficient documentation

## 2020-01-16 DIAGNOSIS — M25662 Stiffness of left knee, not elsewhere classified: Secondary | ICD-10-CM | POA: Insufficient documentation

## 2020-01-16 NOTE — Patient Instructions (Signed)
Access Code: [email or web address] URL: [email or web address] Date: 01/16/2020 Prepared by: Hilda Blades  Exercises Seated Calf Stretch with Strap - 3 x daily - 7 x weekly - 3 reps - 30 seconds hold Seated Ankle Plantar Flexion with Resistance Loop - 1 x daily - 7 x weekly - 3 sets - 15 reps Seated Ankle Inversion with Resistance and Legs Crossed - 1 x daily - 7 x weekly - 3 sets - 10 reps Seated Toe Towel Scrunches - 1 x daily - 7 x weekly - 3 sets - 20 reps Seated Plantar Fascia Mobilization with Small Ball - 3 x daily - 7 x weekly - 2-3 minutes hold

## 2020-01-16 NOTE — Therapy (Signed)
[place], [place], [address] Phone: [phone]   Fax:  [phone]  Physical Therapy Evaluation  Patient Details  Name: Tabitha Oneal MRN: [id number] Date of Birth: July 13, 1955 Referring Provider (PT): Landis Martins, [place]   Encounter Date: 01/16/2020   PT End of Session - 01/16/20 1104    Visit Number 1    Number of Visits 16    Date for PT Re-Evaluation 03/12/20    Authorization Type [employer] MCR    Progress Note Due on Visit 10    PT Start Time 0539    PT Stop Time 1130    PT Time Calculation (min) 38 min    Activity Tolerance Patient tolerated treatment well    Behavior During Therapy [facility] for tasks assessed/performed           Past Medical History:  Diagnosis Date  . Asthma    QVAR daily and Albuterol as needed  . Bladder infection    taking Keflex daily   . Chronic back pain    reason unknown  . Diabetes mellitus    takes Metformin and Tradjenta daily  . GERD (gastroesophageal reflux disease)    takes Omeprazole daily  . Hard of hearing   . Headache    daily.Takes Excedrine daily  . Hepatitis C   . History of blood transfusion    no abnormal reaction noted  . Hyperlipidemia    takes Atorvastatin daily  . Hypertension    takes Quinapril daily  . Joint pain   . Joint swelling   . Nocturia   . Obesity   . Osteoarthritis of left knee 02/23/2012  . Seasonal allergies    takes Claritin and Singulair daily.Nasal spray as needed  . Shortness of breath dyspnea    with exertion    Past Surgical History:  Procedure Laterality Date  . ABDOMINAL HYSTERECTOMY    . INJECTION KNEE  04/09/2012   Procedure: KNEE INJECTION;  Surgeon: Johnny Bridge, MD;  Location: [place];  Service: Orthopedics;  Laterality: Left;  . JOINT REPLACEMENT     acl   bil knees  . KNEE ARTHROTOMY Left 08/28/2015   Procedure: LEFT KNEE ARTHROFIBROSIS EXCISION; pylectomy;  Surgeon: Meredith Pel, MD;  Location: [place];  Service: Orthopedics;  Laterality: Left;  . KNEE CLOSED REDUCTION  04/09/2012   Procedure: CLOSED MANIPULATION KNEE;  Surgeon: Johnny Bridge, MD;  Location: [place];  Service: Orthopedics;  Laterality: Left;  Manipulation Knee with Anesthesia includes Application of Traction   . TOTAL KNEE ARTHROPLASTY  02/23/2012   Procedure: TOTAL KNEE ARTHROPLASTY;  Surgeon: Johnny Bridge, MD;  Location: [place];  Service: Orthopedics;  Laterality: Left;    There were no vitals filed for this visit.    Subjective Assessment - 01/16/20 1054    Subjective Patient reports the bottom of her foot from the heel to the middle of the foot hurts. She states that currently her foot has been feeling better because she has been taking prednisome. She notes pain occurs with standing and walking, she has been in the CAM boot for the past month or so. She can start to come out of the boot but she wears when she needs it. Patient also notes left TKA in 2013, she has had previous therapy for the left knee. She states that her left knee still constantly hurts mainly on the inside, and will wake her up in the  middle of the night.    Pertinent History Previous left TKA with chronic range of motion limitation    Limitations Standing;Walking;House hold activities    How long can you sit comfortably? No limitation    How long can you stand comfortably? "Maybe a few minutes"    How long can you walk comfortably? 10-15 minutes - will be out of breath    Patient Stated Goals Get rid of pain so she can move better    Currently in Pain? Yes    Pain Score 5     Pain Location Foot    Pain Orientation Left    Pain Descriptors / Indicators Tightness    Pain Type Chronic pain    Pain Onset More than a month ago    Pain Frequency Intermittent    Aggravating Factors  Standing, walking    Pain Relieving Factors Sitting and resting, medication    Effect of Pain on Daily Activities  Patient is limited in her walking ability    Multiple Pain Sites Yes    Pain Score 8    Pain Location Knee    Pain Orientation Left    Pain Descriptors / Indicators Aching    Pain Type Chronic pain    Pain Onset More than a month ago    Pain Frequency Constant    Aggravating Factors  Any movement, standing, walking, wakes her up in middle of night    Pain Relieving Factors Rest, medication    Effect of Pain on Daily Activities Patient is limited with walking and mobility    Pain Score 6    Pain Location Back    Pain Orientation Lower    Pain Descriptors / Indicators Aching    Pain Type Chronic pain    Pain Onset More than a month ago    Pain Frequency Constant    Aggravating Factors  When she is doing something, walking, bending    Pain Relieving Factors Sitting and resting    Effect of Pain on Daily Activities Patient limited with activity              [facility] PT Assessment - 01/16/20 0001      Assessment   Medical Diagnosis Plantar fasciitis of left foot, Left knee pain, Low back pain     Referring Provider (PT) Landis Martins, DPM    Onset Date/Surgical Date --   patient reports pain for many years   Hand Dominance Right    Next MD Visit 02/16/2020    Prior Therapy Yes, for left knee in 2016-2017      Precautions   Precautions None      Restrictions   Weight Bearing Restrictions No      Balance Screen   Has the patient fallen in the past 6 months No    Has the patient had a decrease in activity level because of a fear of falling?  No    Is the patient reluctant to leave their home because of a fear of falling?  No      Home Environment   Living Environment Private residence    Living Arrangements Alone    Type of [place] Access Level entry    Home Layout One level      Prior Function   Level of Independence Independent with household mobility with device;Independent with community mobility with device    Leisure None reported      Cognition  Overall Cognitive Status Within Functional Limits for tasks assessed      Observation/Other Assessments   Observations Patient appears in no apparent distress    Focus on Therapeutic Outcomes (FOTO)  64% limitation      Sensation   Light Touch Appears Intact      Coordination   Gross Motor Movements are Fluid and Coordinated Yes      Functional Tests   Functional tests Sit to Stand;Single leg stance      Single Leg Stance   Comments Unable to perform      Sit to Stand   Comments Patient keeps left leg extended due to limitation with left knee flexion      ROM / Strength   AROM / PROM / Strength AROM;Strength      AROM   AROM Assessment Site Knee;Ankle    Right/Left Knee Right;Left    Right Knee Extension 0    Right Knee Flexion 110    Left Knee Extension -10   lacking   Left Knee Flexion 25    Right/Left Ankle Right;Left    Right Ankle Dorsiflexion 5    Right Ankle Plantar Flexion 50    Right Ankle Inversion 35    Right Ankle Eversion 25    Left Ankle Dorsiflexion 0    Left Ankle Plantar Flexion 50    Left Ankle Inversion 35    Left Ankle Eversion 25      Strength   Strength Assessment Site Ankle    Right/Left Ankle Right;Left    Right Ankle Dorsiflexion 5/5    Right Ankle Plantar Flexion 5/5    Right Ankle Inversion 5/5    Right Ankle Eversion 5/5    Left Ankle Dorsiflexion 5/5    Left Ankle Plantar Flexion 4/5    Left Ankle Inversion 4/5    Left Ankle Eversion 5/5      Palpation   Patella mobility Not assessed    Spinal mobility Not assessed    Palpation comment Not assessed      Special Tests   Other special tests None performed      Ambulation/Gait   Ambulation/Gait Yes    Ambulation/Gait Assistance 6: Modified independent (Device/Increase time)    Assistive device Small based quad cane    Gait Comments Patient wearing CAM boot and hinged knee stability brace on left, antalgic gait on left, unsteady                       Objective measurements completed on examination: See above findings.       [facility] Adult PT Treatment/Exercise - 01/16/20 0001      Exercises   Exercises Knee/Hip;Ankle      Ankle Exercises: Stretches   Gastroc Stretch 3 reps;30 seconds    Gastroc Stretch Limitations seated with strap      Ankle Exercises: Seated   Towel Crunch Limitations x20 reps    Other Seated Ankle Exercises Banded ankle PF and inversion with yellow x20 each    Other Seated Ankle Exercises Plantar fascia mobilization with tennis ball                  PT Education - 01/16/20 1104    Education Details Exam findings, POC, HEP    Person(s) Educated Patient    Methods Explanation;Demonstration;Tactile cues;Verbal cues;Handout    Comprehension Verbalized understanding;Returned demonstration;Verbal cues required;Tactile cues required;Need further instruction  PT Short Term Goals - 01/16/20 1105      PT SHORT TERM GOAL #1   Title Patient will be I with initial HEP to progress with PT    Time 4    Period Weeks    Status New    Target Date 02/13/20      PT SHORT TERM GOAL #2   Title Patient will exhibit >/= 5 deg left ankle dorsiflexion to improve gait    Time 4    Period Weeks    Status New    Target Date 02/13/20      PT SHORT TERM GOAL #3   Title Patient will be able to ambulation household distances without CAM boot and </= 5/10 pain to improve mobility    Time 4    Period Weeks    Status New    Target Date 02/13/20             PT Long Term Goals - 01/16/20 1106      PT LONG TERM GOAL #1   Title Patient will be I with final HEP to maintain progress from PT    Time 8    Period Weeks    Status New    Target Date 03/12/20      PT LONG TERM GOAL #2   Title Patient will exhibit >/= 45 deg knee flexion to improve transfer ability    Time 8    Period Weeks    Status New    Target Date 03/12/20      PT LONG TERM GOAL #3   Title Patient will  exhibit left ankle strength grossly 5/5 MMT to improve walking tolerance and arch control    Time 8    Period Weeks    Status New    Target Date 03/12/20      PT LONG TERM GOAL #4   Title Patient will be able to ambulate community level distance with LRAD and </= 5/10 pain level to improve grocery shopping    Time 8    Period Weeks    Status New    Target Date 03/12/20      PT LONG TERM GOAL #5   Title Patient will report improve functional level of </= 42% limitation of FOTO    Time 8    Period Weeks    Status New    Target Date 03/12/20                  Plan - 01/16/20 1105    Clinical Impression Statement Patient presents to PT with report of chronic left foot, left knee, and low back pain. Patient left foot pain seems consistent with plantar fasciitis and she exhibits limitation in ankle DF with weakness and poor arch control. She has been ambulating in CAM boot for past few weeks and she was encouraged to wean out of the boot while in her house and short walks. Patient's left knee pain is related to previous TKA and severe limitation in knee range of motion from 10-25 ROM arch. It is unlikely this will improve much given the chonicity and lack of improvement with previous therapy. Today's evaluation did not have enough time to perform assessment of lower back so will continue with lumbar assessment in future visit. Patient was provided with initial HEP to address foot pain and patient performed with good tolerance. She would benefit from continued skilled PT to progress her strength and motion in order to improve her walking  and maximize functional mobility.    Personal Factors and Comorbidities Fitness;Past/Current Experience;Time since onset of injury/illness/exacerbation;Comorbidity 3+    Comorbidities Contracture of left knee following TKA in 2013, DM, chronic pain syndrome, HTN, BMI    Examination-Activity Limitations Locomotion  Level;Squat;Stairs;Stand;Lift;Bend;Transfers;Dressing    Examination-Participation Restrictions Meal Prep;Cleaning;Occupation;Laundry;Shop;Community Activity    Stability/Clinical Decision Making Evolving/Moderate complexity    Clinical Decision Making Moderate    Rehab Potential Good    PT Frequency 2x / week    PT Duration 8 weeks    PT Treatment/Interventions ADLs/Self Care Home Management;Aquatic Therapy;Cryotherapy;Electrical Stimulation;Iontophoresis 4mg /ml Dexamethasone;Moist Heat;Neuromuscular re-education;Balance training;Therapeutic exercise;Therapeutic activities;Functional mobility training;Stair training;Gait training;Patient/family education;Manual techniques;Dry needling;Passive range of motion;Taping;Joint Manipulations    PT Next Visit Plan Assess HEP and progress PRN, progress calf flexibility and ankle strength, assess lumbar spine, knee range of motion as able    PT Home Exercise Plan [id number]: seated calf stretch with strap, seated banded PF and inversion with yellow, seated towel scrunch, seated plantar fascia mobilization with ball    Consulted and Agree with Plan of Care Patient           Patient will benefit from skilled therapeutic intervention in order to improve the following deficits and impairments:  Abnormal gait, Decreased range of motion, Difficulty walking, Pain, Decreased activity tolerance, Decreased balance, Improper body mechanics, Decreased strength, Decreased mobility, Impaired flexibility  Visit Diagnosis: Pain in left foot  Chronic pain of left knee  Stiffness of left knee, not elsewhere classified  Chronic bilateral low back pain, unspecified whether sciatica present  Muscle weakness (generalized)  Other abnormalities of gait and mobility     Problem List Patient Active Problem List   Diagnosis Date Noted  . Pain in left knee 09/13/2019  . 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 Abnormal EKG 03/02/2019  .  Incomplete left bundle branch block (LBBB) 03/02/2019  . Rebound headache 12/13/2018  . Overuse of medication 12/13/2018  . Intractable chronic migraine without aura and without status migrainosus 12/13/2018  . Microcytic anemia 02/24/2018  . High risk medication use 08/06/2017  . Seasonal and perennial allergic rhinitis 07/30/2017  . Recurrent infections 07/30/2017  . Cough 06/08/2017  . Moderate persistent asthma without complication 16/02/9603  . Chronic constipation 02/04/2017  . Hyperlipidemia due to type 2 diabetes mellitus ([place]) 02/04/2017  . Hallux rigidus, left foot 01/01/2017  . Postmenopausal symptoms 02/22/2016  . Arthrofibrosis of knee joint 08/28/2015  . Left hip pain 10/08/2014  . Type 2 diabetes mellitus without complication, without long-term current use of insulin ([place]) 10/08/2014  . Hyperlipidemia LDL goal <100 10/08/2014  . Essential hypertension, benign 08/30/2014  . Diabetes mellitus, type 2 ([place]) 08/30/2014  . Diverticulosis of colon without hemorrhage 08/30/2014  . Chronic low back pain 08/30/2014  . Chronic pain syndrome 08/30/2014  . Asthma in adult without complication 54/01/8118  . 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 Osteoarthritis of left knee 02/23/2012    Hilda Blades, PT, DPT, LAT, ATC 01/16/20  1:44 PM Phone: [phone] Fax: [facility] [address] [place], [place], [address] Phone: [phone]   Fax:  [phone]  Name: Tabitha Oneal MRN: [id number] Date of Birth: 12/06/1955

## 2020-01-23 ENCOUNTER — Other Ambulatory Visit: Payer: Self-pay

## 2020-01-23 ENCOUNTER — Other Ambulatory Visit: Payer: Self-pay | Admitting: Allergy & Immunology

## 2020-01-23 ENCOUNTER — Encounter: Payer: Self-pay | Admitting: Physical Therapy

## 2020-01-23 ENCOUNTER — Ambulatory Visit: Payer: Medicare Other | Admitting: Physical Therapy

## 2020-01-23 DIAGNOSIS — G8929 Other chronic pain: Secondary | ICD-10-CM

## 2020-01-23 DIAGNOSIS — R2689 Other abnormalities of gait and mobility: Secondary | ICD-10-CM

## 2020-01-23 DIAGNOSIS — M79672 Pain in left foot: Secondary | ICD-10-CM

## 2020-01-23 DIAGNOSIS — M25562 Pain in left knee: Secondary | ICD-10-CM

## 2020-01-23 DIAGNOSIS — M6281 Muscle weakness (generalized): Secondary | ICD-10-CM

## 2020-01-23 DIAGNOSIS — M25662 Stiffness of left knee, not elsewhere classified: Secondary | ICD-10-CM

## 2020-01-23 NOTE — Therapy (Signed)
[place], [place], [address] Phone: [phone]   Fax:  [phone]  Physical Therapy Treatment  Patient Details  Name: Tabitha Oneal MRN: [id number] Date of Birth: 03/24/1956 Referring Provider (PT): Landis Martins, [place]   Encounter Date: 01/23/2020   PT End of Session - 01/23/20 1412    Visit Number 2    Number of Visits 16    Date for PT Re-Evaluation 03/12/20    Authorization Type UHC MCR    Progress Note Due on Visit 10    PT Start Time 1400    PT Stop Time 1442    PT Time Calculation (min) 42 min    Activity Tolerance Patient tolerated treatment well    Behavior During Therapy [facility] for tasks assessed/performed           Past Medical History:  Diagnosis Date  . Asthma    QVAR daily and Albuterol as needed  . Bladder infection    taking Keflex daily   . Chronic back pain    reason unknown  . Diabetes mellitus    takes Metformin and Tradjenta daily  . GERD (gastroesophageal reflux disease)    takes Omeprazole daily  . Hard of hearing   . Headache    daily.Takes Excedrine daily  . Hepatitis C   . History of blood transfusion    no abnormal reaction noted  . Hyperlipidemia    takes Atorvastatin daily  . Hypertension    takes Quinapril daily  . Joint pain   . Joint swelling   . Nocturia   . Obesity   . Osteoarthritis of left knee 02/23/2012  . Seasonal allergies    takes Claritin and Singulair daily.Nasal spray as needed  . Shortness of breath dyspnea    with exertion    Past Surgical History:  Procedure Laterality Date  . ABDOMINAL HYSTERECTOMY    . INJECTION KNEE  04/09/2012   Procedure: KNEE INJECTION;  Surgeon: Johnny Bridge, MD;  Location: [place];  Service: Orthopedics;  Laterality: Left;  . JOINT REPLACEMENT     acl   bil knees  . KNEE ARTHROTOMY Left 08/28/2015   Procedure: LEFT KNEE ARTHROFIBROSIS EXCISION; pylectomy;  Surgeon: Meredith Pel, MD;  Location: [place];  Service: Orthopedics;  Laterality: Left;  . KNEE CLOSED REDUCTION  04/09/2012   Procedure: CLOSED MANIPULATION KNEE;  Surgeon: Johnny Bridge, MD;  Location: [place];  Service: Orthopedics;  Laterality: Left;  Manipulation Knee with Anesthesia includes Application of Traction   . TOTAL KNEE ARTHROPLASTY  02/23/2012   Procedure: TOTAL KNEE ARTHROPLASTY;  Surgeon: Johnny Bridge, MD;  Location: [place];  Service: Orthopedics;  Laterality: Left;    There were no vitals filed for this visit.   Subjective Assessment - 01/23/20 1409    Subjective Patient reports continued left foot soreness on the bottom. She is not wearing her boot and is doing her exercises regularly. Her knee still won't bend.    Patient Stated Goals Get rid of pain so she can move better    Currently in Pain? Yes    Pain Score 6     Pain Location Foot    Pain Orientation Left    Pain Descriptors / Indicators Sore    Pain Type Chronic pain    Pain Onset More than a month ago    Pain Frequency Intermittent    Pain Score 7  Pain Location Knee    Pain Orientation Left    Pain Descriptors / Indicators Aching;Tightness    Pain Type Chronic pain    Pain Onset More than a month ago    Pain Frequency Constant    Pain Score 6    Pain Location Back    Pain Orientation Lower    Pain Descriptors / Indicators Aching    Pain Type Chronic pain    Pain Onset More than a month ago    Pain Frequency Constant              [facility] PT Assessment - 01/23/20 0001      AROM   AROM Assessment Site Lumbar    Lumbar Flexion 50% - she remains in a lordotic position      Strength   Strength Assessment Site Hip    Right/Left Hip Right;Left    Right Hip Flexion 4/5    Right Hip Extension 3/5    Right Hip ABduction 3/5    Left Hip Flexion 4-/5    Left Hip Extension 3/5    Left Hip ABduction 3-/5                         [facility] Adult PT Treatment/Exercise -  01/23/20 0001      Exercises   Exercises Knee/Hip;Ankle      Knee/Hip Exercises: Stretches   Other Knee/Hip Stretches Seated lumbar flexion stretch reach to floor 5 x 10 sec      Knee/Hip Exercises: Aerobic   Nustep L3 x 5 min with UE and LE - focus on left knee motion      Knee/Hip Exercises: Supine   Bridges 3 sets;5 sets    Bridges Limitations legs straight with feet elevated on bolster    Straight Leg Raises 2 sets;10 reps      Knee/Hip Exercises: Sidelying   Hip ABduction 2 sets;10 reps      Knee/Hip Exercises: Prone   Straight Leg Raises 2 sets;10 reps      Ankle Exercises: Stretches   Slant Board Stretch 3 reps;30 seconds      Ankle Exercises: Standing   Heel Raises 10 reps   2 sets   Heel Raises Limitations ball squeeze between heels      Ankle Exercises: Supine   T-Band Inversion with yellow 2 x 20                  PT Education - 01/23/20 1412    Education Details HEP update    Person(s) Educated Patient    Methods Explanation;Demonstration;Verbal cues;Handout    Comprehension Verbalized understanding;Need further instruction;Returned demonstration;Verbal cues required            PT Short Term Goals - 01/16/20 1105      PT SHORT TERM GOAL #1   Title Patient will be I with initial HEP to progress with PT    Time 4    Period Weeks    Status New    Target Date 02/13/20      PT SHORT TERM GOAL #2   Title Patient will exhibit >/= 5 deg left ankle dorsiflexion to improve gait    Time 4    Period Weeks    Status New    Target Date 02/13/20      PT SHORT TERM GOAL #3   Title Patient will be able to ambulation household distances without CAM boot and </= 5/10 pain to  improve mobility    Time 4    Period Weeks    Status New    Target Date 02/13/20             PT Long Term Goals - 01/16/20 1106      PT LONG TERM GOAL #1   Title Patient will be I with final HEP to maintain progress from PT    Time 8    Period Weeks    Status New     Target Date 03/12/20      PT LONG TERM GOAL #2   Title Patient will exhibit >/= 45 deg knee flexion to improve transfer ability    Time 8    Period Weeks    Status New    Target Date 03/12/20      PT LONG TERM GOAL #3   Title Patient will exhibit left ankle strength grossly 5/5 MMT to improve walking tolerance and arch control    Time 8    Period Weeks    Status New    Target Date 03/12/20      PT LONG TERM GOAL #4   Title Patient will be able to ambulate community level distance with LRAD and </= 5/10 pain level to improve grocery shopping    Time 8    Period Weeks    Status New    Target Date 03/12/20      PT LONG TERM GOAL #5   Title Patient will report improve functional level of </= 42% limitation of FOTO    Time 8    Period Weeks    Status New    Target Date 03/12/20                 Plan - 01/23/20 1413    Clinical Impression Statement Patient tolerated therapy well with no adverse effects. Therapy focused on improving left foot mobility and strength, improving left knee motion, and initiating exercises to improve low back pain. She tolerated addition of core and hip strengthening well, and progression left ankle stretching and strengthening. She continues to exhibit significant knee flexion limitation and it is unlikely this will improve given chonicity. She would benefit from continued skilled PT to progress her strength and motion in order to improve her walking and maximize functional mobility.    PT Treatment/Interventions ADLs/Self Care Home Management;Aquatic Therapy;Cryotherapy;Electrical Stimulation;Iontophoresis 4mg /ml Dexamethasone;Moist Heat;Neuromuscular re-education;Balance training;Therapeutic exercise;Therapeutic activities;Functional mobility training;Stair training;Gait training;Patient/family education;Manual techniques;Dry needling;Passive range of motion;Taping;Joint Manipulations    PT Next Visit Plan Assess HEP and progress PRN, progress left  calf flexibility and ankle strength, manual and stretching for left knee range of motion as able    PT Home Exercise Plan [email or web address]: seated calf stretch with strap, seated banded PF and inversion with yellow, seated towel scrunch, seated plantar fascia mobilization with ball, standing heel raises, SLR, sidelying hip abduction, prone hip extension, seated lumbar flexion stretch    Consulted and Agree with Plan of Care Patient           Patient will benefit from skilled therapeutic intervention in order to improve the following deficits and impairments:  Abnormal gait, Decreased range of motion, Difficulty walking, Pain, Decreased activity tolerance, Decreased balance, Improper body mechanics, Decreased strength, Decreased mobility, Impaired flexibility  Visit Diagnosis: Pain in left foot  Chronic pain of left knee  Stiffness of left knee, not elsewhere classified  Chronic bilateral low back pain, unspecified whether sciatica present  Muscle weakness (generalized)  Other abnormalities of gait  and mobility     Problem List Patient Active Problem List   Diagnosis Date Noted  . Pain in left knee 09/13/2019  . 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 Abnormal EKG 03/02/2019  . Incomplete left bundle branch block (LBBB) 03/02/2019  . Rebound headache 12/13/2018  . Overuse of medication 12/13/2018  . Intractable chronic migraine without aura and without status migrainosus 12/13/2018  . Microcytic anemia 02/24/2018  . High risk medication use 08/06/2017  . Seasonal and perennial allergic rhinitis 07/30/2017  . Recurrent infections 07/30/2017  . Cough 06/08/2017  . Moderate persistent asthma without complication 03/70/4888  . Chronic constipation 02/04/2017  . Hyperlipidemia due to type 2 diabetes mellitus ([place]) 02/04/2017  . Hallux rigidus, left foot 01/01/2017  . Postmenopausal symptoms 02/22/2016  . Arthrofibrosis of knee joint 08/28/2015  . Left  hip pain 10/08/2014  . Type 2 diabetes mellitus without complication, without long-term current use of insulin ([place]) 10/08/2014  . Hyperlipidemia LDL goal <100 10/08/2014  . Essential hypertension, benign 08/30/2014  . Diabetes mellitus, type 2 ([place]) 08/30/2014  . Diverticulosis of colon without hemorrhage 08/30/2014  . Chronic low back pain 08/30/2014  . Chronic pain syndrome 08/30/2014  . Asthma in adult without complication 91/69/4503  . 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 Osteoarthritis of left knee 02/23/2012    Hilda Blades, PT, DPT, LAT, ATC 01/23/20  2:56 PM Phone: [phone] Fax: [facility] [address] [place], [place], [address] Phone: [phone]   Fax:  [phone]  Name: Tabitha Oneal MRN: [id number] Date of Birth: 08/21/1955

## 2020-01-23 NOTE — Patient Instructions (Signed)
Access Code: [email or web address] URL: [email or web address] Date: 01/23/2020 Prepared by: Hilda Blades  Exercises Seated Calf Stretch with Strap - 3 x daily - 7 x weekly - 3 reps - 30 seconds hold Seated Ankle Plantar Flexion with Resistance Loop - 1 x daily - 7 x weekly - 3 sets - 20 reps Seated Ankle Inversion with Resistance and Legs Crossed - 1 x daily - 7 x weekly - 3 sets - 20 reps Seated Toe Towel Scrunches - 1 x daily - 7 x weekly - 3 sets - 20 reps Seated Plantar Fascia Mobilization with Small Ball - 3 x daily - 7 x weekly - 2-3 minutes hold Active Straight Leg Raise with Quad Set - 1 x daily - 7 x weekly - 3 sets - 10 reps Sidelying Hip Abduction - 1 x daily - 7 x weekly - 3 sets - 10 reps Prone Hip Extension - 1 x daily - 7 x weekly - 3 sets - 10 reps Heel rises with counter support - 1 x daily - 7 x weekly - 3 sets - 10 reps Seated Lumbar Flexion Stretch - 3 x daily - 7 x weekly - 5 reps - 10 seconds hold

## 2020-01-27 ENCOUNTER — Other Ambulatory Visit: Payer: Self-pay

## 2020-01-27 ENCOUNTER — Ambulatory Visit: Payer: Medicare Other | Attending: Sports Medicine

## 2020-01-27 DIAGNOSIS — G8929 Other chronic pain: Secondary | ICD-10-CM

## 2020-01-27 DIAGNOSIS — M25662 Stiffness of left knee, not elsewhere classified: Secondary | ICD-10-CM | POA: Diagnosis present

## 2020-01-27 DIAGNOSIS — M25562 Pain in left knee: Secondary | ICD-10-CM | POA: Diagnosis present

## 2020-01-27 DIAGNOSIS — R2689 Other abnormalities of gait and mobility: Secondary | ICD-10-CM | POA: Diagnosis present

## 2020-01-27 DIAGNOSIS — M545 Low back pain, unspecified: Secondary | ICD-10-CM

## 2020-01-27 DIAGNOSIS — M79672 Pain in left foot: Secondary | ICD-10-CM

## 2020-01-27 DIAGNOSIS — M6281 Muscle weakness (generalized): Secondary | ICD-10-CM

## 2020-01-28 NOTE — Therapy (Signed)
[place], [place], [address] Phone: [phone]   Fax:  [phone]  Physical Therapy Treatment  Patient Details  Name: Tabitha Oneal MRN: [id number] Date of Birth: November 20, 1955 Referring Provider (PT): Landis Martins, [place]   Encounter Date: 01/27/2020   PT End of Session - 01/28/20 1906    Visit Number 3    Number of Visits 16    Date for PT Re-Evaluation 03/12/20    Authorization Type UHC MCR    Progress Note Due on Visit 10    PT Start Time 2947    PT Stop Time 1136    PT Time Calculation (min) 51 min    Activity Tolerance Patient tolerated treatment well    Behavior During Therapy [facility] for tasks assessed/performed           Past Medical History:  Diagnosis Date   Asthma    QVAR daily and Albuterol as needed   Bladder infection    taking Keflex daily    Chronic back pain    reason unknown   Diabetes mellitus    takes Metformin and Tradjenta daily   GERD (gastroesophageal reflux disease)    takes Omeprazole daily   Hard of hearing    Headache    daily.Takes Excedrine daily   Hepatitis C    History of blood transfusion    no abnormal reaction noted   Hyperlipidemia    takes Atorvastatin daily   Hypertension    takes Quinapril daily   Joint pain    Joint swelling    Nocturia    Obesity    Osteoarthritis of left knee 02/23/2012   Seasonal allergies    takes Claritin and Singulair daily.Nasal spray as needed   Shortness of breath dyspnea    with exertion    Past Surgical History:  Procedure Laterality Date   ABDOMINAL HYSTERECTOMY     INJECTION KNEE  04/09/2012   Procedure: KNEE INJECTION;  Surgeon: Johnny Bridge, MD;  Location: [place];  Service: Orthopedics;  Laterality: Left;   JOINT REPLACEMENT     acl   bil knees   KNEE ARTHROTOMY Left 08/28/2015   Procedure: LEFT KNEE ARTHROFIBROSIS EXCISION; pylectomy;  Surgeon: Meredith Pel, MD;  Location: [place];  Service: Orthopedics;  Laterality: Left;   KNEE CLOSED REDUCTION  04/09/2012   Procedure: CLOSED MANIPULATION KNEE;  Surgeon: Johnny Bridge, MD;  Location: [place];  Service: Orthopedics;  Laterality: Left;  Manipulation Knee with Anesthesia includes Application of Traction    TOTAL KNEE ARTHROPLASTY  02/23/2012   Procedure: TOTAL KNEE ARTHROPLASTY;  Surgeon: Johnny Bridge, MD;  Location: [place];  Service: Orthopedics;  Laterality: Left;    There were no vitals filed for this visit.                      [facility] Adult PT Treatment/Exercise - 01/28/20 0001      Exercises   Exercises Knee/Hip;Ankle      Knee/Hip Exercises: Aerobic   Nustep L3 x 5 min with UE and LE - focus on left knee motion      Modalities   Modalities Cryotherapy      Cryotherapy   Number Minutes Cryotherapy 5 Minutes    Cryotherapy Location Other (comment)   Plantar surface of L foot   Type of Cryotherapy Ice massage   frozen water bottle     Manual Therapy  Manual Therapy Soft tissue mobilization    Soft tissue mobilization oft tissue work to the plantar surrface of the L foot      Ankle Exercises: Stretches   Gastroc Stretch 20 seconds   and plantar fascia stretch in prep for standing/walking   Slant Board Stretch 3 reps;30 seconds      Ankle Exercises: Seated   Toe Raise 15 reps   for active PF stretch in prep for walking/standing   Other Seated Ankle Exercises Plantar fascia mobilization with tennis ball                  PT Education - 01/28/20 1857    Education Details To wear shoes as much as possible when walking. A program to help manage the pt L foot plantar fasciitis as outline in treatment to minimize irritation/inflamation.    Person(s) Educated Patient    Methods Explanation;Demonstration;Tactile cues;Verbal cues;Handout    Comprehension Verbalized understanding;Returned demonstration;Verbal cues  required;Tactile cues required;Need further instruction            PT Short Term Goals - 01/16/20 1105      PT SHORT TERM GOAL #1   Title Patient will be I with initial HEP to progress with PT    Time 4    Period Weeks    Status New    Target Date 02/13/20      PT SHORT TERM GOAL #2   Title Patient will exhibit >/= 5 deg left ankle dorsiflexion to improve gait    Time 4    Period Weeks    Status New    Target Date 02/13/20      PT SHORT TERM GOAL #3   Title Patient will be able to ambulation household distances without CAM boot and </= 5/10 pain to improve mobility    Time 4    Period Weeks    Status New    Target Date 02/13/20             PT Long Term Goals - 01/16/20 1106      PT LONG TERM GOAL #1   Title Patient will be I with final HEP to maintain progress from PT    Time 8    Period Weeks    Status New    Target Date 03/12/20      PT LONG TERM GOAL #2   Title Patient will exhibit >/= 45 deg knee flexion to improve transfer ability    Time 8    Period Weeks    Status New    Target Date 03/12/20      PT LONG TERM GOAL #3   Title Patient will exhibit left ankle strength grossly 5/5 MMT to improve walking tolerance and arch control    Time 8    Period Weeks    Status New    Target Date 03/12/20      PT LONG TERM GOAL #4   Title Patient will be able to ambulate community level distance with LRAD and </= 5/10 pain level to improve grocery shopping    Time 8    Period Weeks    Status New    Target Date 03/12/20      PT LONG TERM GOAL #5   Title Patient will report improve functional level of </= 42% limitation of FOTO    Time 8    Period Weeks    Status New    Target Date 03/12/20  Plan - 01/28/20 1907    Clinical Impression Statement Today's PT session was focused completing and educating the pt on a program to help improve and manage her L foot plantar fasciitis as outline in treatment to minimize irritation/inflamation.  Education included warm up stretches prior to walking, massage c tennis ball, and use of frozen water bottle massage or and ice pack on the bottom of her L foot 3x daily. Pt will continueto benefit from PT to address back, L knee and L foot deficits to decrease pain and improve functional mobility.    Personal Factors and Comorbidities Fitness;Past/Current Experience;Time since onset of injury/illness/exacerbation;Comorbidity 3+    Comorbidities Contracture of left knee following TKA in 2013, DM, chronic pain syndrome, HTN, BMI    Examination-Activity Limitations Locomotion Level;Squat;Stairs;Stand;Lift;Bend;Transfers;Dressing    Examination-Participation Restrictions Meal Prep;Cleaning;Occupation;Laundry;Shop;Community Activity    Stability/Clinical Decision Making Evolving/Moderate complexity    Clinical Decision Making Moderate    Rehab Potential Good    PT Frequency 2x / week    PT Duration 8 weeks    PT Treatment/Interventions ADLs/Self Care Home Management;Aquatic Therapy;Cryotherapy;Electrical Stimulation;Iontophoresis 4mg /ml Dexamethasone;Moist Heat;Neuromuscular re-education;Balance training;Therapeutic exercise;Therapeutic activities;Functional mobility training;Stair training;Gait training;Patient/family education;Manual techniques;Dry needling;Passive range of motion;Taping;Joint Manipulations    PT Next Visit Plan Assess HEP and progress PRN, progress left calf flexibility and ankle strength, manual and stretching for left knee range of motion as able    PT Home Exercise Plan [email or web address]: seated calf stretch with strap, seated banded PF and inversion with yellow, seated towel scrunch, seated plantar fascia mobilization with ball, standing heel raises, SLR, sidelying hip abduction, prone hip extension, seated lumbar flexion stretch    Consulted and Agree with Plan of Care Patient           Patient will benefit from skilled therapeutic intervention in order to improve the following  deficits and impairments:  Abnormal gait, Decreased range of motion, Difficulty walking, Pain, Decreased activity tolerance, Decreased balance, Improper body mechanics, Decreased strength, Decreased mobility, Impaired flexibility  Visit Diagnosis: Pain in left foot  Chronic pain of left knee  Stiffness of left knee, not elsewhere classified  Chronic bilateral low back pain, unspecified whether sciatica present  Muscle weakness (generalized)  Other abnormalities of gait and mobility     Problem List Patient Active Problem List   Diagnosis Date Noted   Pain in left knee 09/13/2019   Aftercare following left knee joint replacement surgery 07/19/2019   Abnormal EKG 03/02/2019   Incomplete left bundle branch block (LBBB) 03/02/2019   Rebound headache 12/13/2018   Overuse of medication 12/13/2018   Intractable chronic migraine without aura and without status migrainosus 12/13/2018   Microcytic anemia 02/24/2018   High risk medication use 08/06/2017   Seasonal and perennial allergic rhinitis 07/30/2017   Recurrent infections 07/30/2017   Cough 06/08/2017   Moderate persistent asthma without complication 22/97/9892   Chronic constipation 02/04/2017   Hyperlipidemia due to type 2 diabetes mellitus ([place]) 02/04/2017   Hallux rigidus, left foot 01/01/2017   Postmenopausal symptoms 02/22/2016   Arthrofibrosis of knee joint 08/28/2015   Left hip pain 10/08/2014   Type 2 diabetes mellitus without complication, without long-term current use of insulin ([place]) 10/08/2014   Hyperlipidemia LDL goal <100 10/08/2014   Essential hypertension, benign 08/30/2014   Diabetes mellitus, type 2 ([place]) 08/30/2014   Diverticulosis of colon without hemorrhage 08/30/2014   Chronic low back pain 08/30/2014   Chronic pain syndrome 08/30/2014   Asthma in adult without complication 11/94/1740   Contracture  of left knee, arthrofibrosis post op TKA 04/09/2012   Osteoarthritis of  left knee 02/23/2012    Gar Ponto MS, PT 01/28/20 7:24 PM   [facility] [address] [place], [place], [address] Phone: [phone]   Fax:  [phone]  Name: Tabitha Oneal MRN: [id number] Date of Birth: [birth year]

## 2020-01-29 ENCOUNTER — Other Ambulatory Visit: Payer: Self-pay | Admitting: Sports Medicine

## 2020-01-31 ENCOUNTER — Other Ambulatory Visit: Payer: Self-pay | Admitting: Allergy & Immunology

## 2020-02-01 ENCOUNTER — Ambulatory Visit: Payer: Medicare Other | Admitting: Physical Therapy

## 2020-02-01 ENCOUNTER — Encounter: Payer: Self-pay | Admitting: Physical Therapy

## 2020-02-01 ENCOUNTER — Other Ambulatory Visit: Payer: Self-pay

## 2020-02-01 DIAGNOSIS — R2689 Other abnormalities of gait and mobility: Secondary | ICD-10-CM

## 2020-02-01 DIAGNOSIS — M25662 Stiffness of left knee, not elsewhere classified: Secondary | ICD-10-CM

## 2020-02-01 DIAGNOSIS — M79672 Pain in left foot: Secondary | ICD-10-CM

## 2020-02-01 DIAGNOSIS — M6281 Muscle weakness (generalized): Secondary | ICD-10-CM

## 2020-02-01 DIAGNOSIS — M25562 Pain in left knee: Secondary | ICD-10-CM

## 2020-02-01 DIAGNOSIS — M545 Low back pain, unspecified: Secondary | ICD-10-CM

## 2020-02-01 NOTE — Therapy (Signed)
[place], [place], [address] Phone: [phone]   Fax:  [phone]  Physical Therapy Treatment  Patient Details  Name: Tabitha Oneal MRN: [id number] Date of Birth: Jul 21, 1955 Referring Provider (PT): Landis Martins, [place]   Encounter Date: 02/01/2020   PT End of Session - 02/01/20 1248    Visit Number 4    Number of Visits 16    Date for PT Re-Evaluation 03/12/20    Authorization Type UHC MCR    Progress Note Due on Visit 10    PT Start Time 1215    PT Stop Time 1300    PT Time Calculation (min) 45 min    Activity Tolerance Patient tolerated treatment well    Behavior During Therapy [facility] for tasks assessed/performed           Past Medical History:  Diagnosis Date  . Asthma    QVAR daily and Albuterol as needed  . Bladder infection    taking Keflex daily   . Chronic back pain    reason unknown  . Diabetes mellitus    takes Metformin and Tradjenta daily  . GERD (gastroesophageal reflux disease)    takes Omeprazole daily  . Hard of hearing   . Headache    daily.Takes Excedrine daily  . Hepatitis C   . History of blood transfusion    no abnormal reaction noted  . Hyperlipidemia    takes Atorvastatin daily  . Hypertension    takes Quinapril daily  . Joint pain   . Joint swelling   . Nocturia   . Obesity   . Osteoarthritis of left knee 02/23/2012  . Seasonal allergies    takes Claritin and Singulair daily.Nasal spray as needed  . Shortness of breath dyspnea    with exertion    Past Surgical History:  Procedure Laterality Date  . ABDOMINAL HYSTERECTOMY    . INJECTION KNEE  04/09/2012   Procedure: KNEE INJECTION;  Surgeon: Johnny Bridge, MD;  Location: [place];  Service: Orthopedics;  Laterality: Left;  . JOINT REPLACEMENT     acl   bil knees  . KNEE ARTHROTOMY Left 08/28/2015   Procedure: LEFT KNEE ARTHROFIBROSIS EXCISION; pylectomy;  Surgeon: Meredith Pel, MD;  Location: [place];  Service: Orthopedics;  Laterality: Left;  . KNEE CLOSED REDUCTION  04/09/2012   Procedure: CLOSED MANIPULATION KNEE;  Surgeon: Johnny Bridge, MD;  Location: [place];  Service: Orthopedics;  Laterality: Left;  Manipulation Knee with Anesthesia includes Application of Traction   . TOTAL KNEE ARTHROPLASTY  02/23/2012   Procedure: TOTAL KNEE ARTHROPLASTY;  Surgeon: Johnny Bridge, MD;  Location: [place];  Service: Orthopedics;  Laterality: Left;    There were no vitals filed for this visit.   Subjective Assessment - 02/01/20 1242    Subjective Patient reports she is doing well, sore following therapy.    Patient Stated Goals Get rid of pain so she can move better    Currently in Pain? Yes    Pain Score 4     Pain Location Foot    Pain Orientation Left    Pain Descriptors / Indicators Sore    Pain Type Chronic pain    Pain Onset More than a month ago    Pain Frequency Intermittent    Pain Score 6    Pain Location Knee    Pain Orientation Left    Pain Descriptors / Indicators Aching  Pain Type Chronic pain    Pain Onset More than a month ago    Pain Frequency Constant    Pain Score 5    Pain Location Back    Pain Orientation Lower    Pain Descriptors / Indicators Aching    Pain Type Chronic pain    Pain Onset More than a month ago    Pain Frequency Constant              [facility] PT Assessment - 02/01/20 0001      AROM   Overall AROM Comments Left knee 10-25 deg arc                         [facility] Adult PT Treatment/Exercise - 02/01/20 0001      Exercises   Exercises Knee/Hip;Ankle      Knee/Hip Exercises: Aerobic   Nustep L3 x 4 min with UE and LE - focus on left knee motion      Knee/Hip Exercises: Supine   Bridges 3 sets;5 sets    Bridges Limitations legs straight with feet elevated on bolster    Straight Leg Raises 2 sets;10 reps      Knee/Hip Exercises: Sidelying   Hip ABduction 2 sets;10 reps       Knee/Hip Exercises: Prone   Straight Leg Raises 2 sets;10 reps      Manual Therapy   Manual therapy comments Trialed performing left knee joint mobilizations but unable get any joint movement, especially PF      Ankle Exercises: Stretches   Slant Board Stretch 3 reps;30 seconds      Ankle Exercises: Standing   Heel Raises 15 reps   2 sets   Heel Raises Limitations ball squeeze between heels      Ankle Exercises: Supine   T-Band Inversion with yellow 2 x 20                  PT Education - 02/01/20 1248    Education Details HEP    Person(s) Educated Patient    Methods Explanation    Comprehension Verbalized understanding;Need further instruction            PT Short Term Goals - 01/16/20 1105      PT SHORT TERM GOAL #1   Title Patient will be I with initial HEP to progress with PT    Time 4    Period Weeks    Status New    Target Date 02/13/20      PT SHORT TERM GOAL #2   Title Patient will exhibit >/= 5 deg left ankle dorsiflexion to improve gait    Time 4    Period Weeks    Status New    Target Date 02/13/20      PT SHORT TERM GOAL #3   Title Patient will be able to ambulation household distances without CAM boot and </= 5/10 pain to improve mobility    Time 4    Period Weeks    Status New    Target Date 02/13/20             PT Long Term Goals - 01/16/20 1106      PT LONG TERM GOAL #1   Title Patient will be I with final HEP to maintain progress from PT    Time 8    Period Weeks    Status New    Target Date 03/12/20  PT LONG TERM GOAL #2   Title Patient will exhibit >/= 45 deg knee flexion to improve transfer ability    Time 8    Period Weeks    Status New    Target Date 03/12/20      PT LONG TERM GOAL #3   Title Patient will exhibit left ankle strength grossly 5/5 MMT to improve walking tolerance and arch control    Time 8    Period Weeks    Status New    Target Date 03/12/20      PT LONG TERM GOAL #4   Title Patient  will be able to ambulate community level distance with LRAD and </= 5/10 pain level to improve grocery shopping    Time 8    Period Weeks    Status New    Target Date 03/12/20      PT LONG TERM GOAL #5   Title Patient will report improve functional level of </= 42% limitation of FOTO    Time 8    Period Weeks    Status New    Target Date 03/12/20                 Plan - 02/01/20 1251    Clinical Impression Statement Patient tolerated therapy well with no adverse effects. Continued progression of BLE strengthening and focus on left ankle strength and flexibility. Trialed mobilizations and stretching for left knee but unsuccessful. Reports continued soreness with exercises but seems to be slowly improving. She would benefit from continued skilled PT to progress her strength and motion in order to improve her walking and maximize functional mobility.    PT Treatment/Interventions ADLs/Self Care Home Management;Aquatic Therapy;Cryotherapy;Electrical Stimulation;Iontophoresis 4mg /ml Dexamethasone;Moist Heat;Neuromuscular re-education;Balance training;Therapeutic exercise;Therapeutic activities;Functional mobility training;Stair training;Gait training;Patient/family education;Manual techniques;Dry needling;Passive range of motion;Taping;Joint Manipulations    PT Next Visit Plan Assess HEP and progress PRN, progress left calf flexibility and ankle strength, manual and stretching for left knee range of motion as able    PT Home Exercise Plan [email or web address]: seated calf stretch with strap, seated banded PF and inversion with yellow, seated towel scrunch, seated plantar fascia mobilization with ball, standing heel raises, SLR, sidelying hip abduction, prone hip extension, seated lumbar flexion stretch    Consulted and Agree with Plan of Care Patient           Patient will benefit from skilled therapeutic intervention in order to improve the following deficits and impairments:  Abnormal gait,  Decreased range of motion, Difficulty walking, Pain, Decreased activity tolerance, Decreased balance, Improper body mechanics, Decreased strength, Decreased mobility, Impaired flexibility  Visit Diagnosis: Pain in left foot  Chronic pain of left knee  Stiffness of left knee, not elsewhere classified  Chronic bilateral low back pain, unspecified whether sciatica present  Muscle weakness (generalized)  Other abnormalities of gait and mobility     Problem List Patient Active Problem List   Diagnosis Date Noted  . Pain in left knee 09/13/2019  . 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 Abnormal EKG 03/02/2019  . Incomplete left bundle branch block (LBBB) 03/02/2019  . Rebound headache 12/13/2018  . Overuse of medication 12/13/2018  . Intractable chronic migraine without aura and without status migrainosus 12/13/2018  . Microcytic anemia 02/24/2018  . High risk medication use 08/06/2017  . Seasonal and perennial allergic rhinitis 07/30/2017  . Recurrent infections 07/30/2017  . Cough 06/08/2017  . Moderate persistent asthma without complication 95/62/1308  . Chronic constipation 02/04/2017  .  Hyperlipidemia due to type 2 diabetes mellitus ([place]) 02/04/2017  . Hallux rigidus, left foot 01/01/2017  . Postmenopausal symptoms 02/22/2016  . Arthrofibrosis of knee joint 08/28/2015  . Left hip pain 10/08/2014  . Type 2 diabetes mellitus without complication, without long-term current use of insulin ([place]) 10/08/2014  . Hyperlipidemia LDL goal <100 10/08/2014  . Essential hypertension, benign 08/30/2014  . Diabetes mellitus, type 2 ([place]) 08/30/2014  . Diverticulosis of colon without hemorrhage 08/30/2014  . Chronic low back pain 08/30/2014  . Chronic pain syndrome 08/30/2014  . Asthma in adult without complication 19/14/7829  . 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 Osteoarthritis of left knee 02/23/2012    Hilda Blades,  PT, DPT, LAT, ATC 02/01/20  1:18 PM Phone: [phone] Fax: [place] [facility] [address] [place], [place], [address] Phone: [phone]   Fax:  [phone]  Name: Chi Woodham MRN: [id number] Date of Birth: 1955/07/09

## 2020-02-03 ENCOUNTER — Ambulatory Visit: Payer: Medicare Other | Admitting: Physical Therapy

## 2020-02-03 ENCOUNTER — Other Ambulatory Visit: Payer: Self-pay

## 2020-02-03 ENCOUNTER — Encounter: Payer: Self-pay | Admitting: Physical Therapy

## 2020-02-03 DIAGNOSIS — G8929 Other chronic pain: Secondary | ICD-10-CM

## 2020-02-03 DIAGNOSIS — M6281 Muscle weakness (generalized): Secondary | ICD-10-CM

## 2020-02-03 DIAGNOSIS — M25562 Pain in left knee: Secondary | ICD-10-CM

## 2020-02-03 DIAGNOSIS — M79672 Pain in left foot: Secondary | ICD-10-CM | POA: Diagnosis not present

## 2020-02-03 DIAGNOSIS — R2689 Other abnormalities of gait and mobility: Secondary | ICD-10-CM

## 2020-02-03 DIAGNOSIS — M25662 Stiffness of left knee, not elsewhere classified: Secondary | ICD-10-CM

## 2020-02-03 NOTE — Therapy (Signed)
[place], [place], [address] Phone: [phone]   Fax:  [phone]  Physical Therapy Treatment  Patient Details  Name: Tabitha Oneal MRN: [id number] Date of Birth: 1955/06/29 Referring Provider (PT): Landis Martins, [place]   Encounter Date: 02/03/2020   PT End of Session - 02/03/20 1056    Visit Number 5    Number of Visits 16    Date for PT Re-Evaluation 03/12/20    Authorization Type UHC MCR    Progress Note Due on Visit 10    PT Start Time 1048    PT Stop Time 1130    PT Time Calculation (min) 42 min    Activity Tolerance Patient tolerated treatment well    Behavior During Therapy [facility] for tasks assessed/performed           Past Medical History:  Diagnosis Date  . Asthma    QVAR daily and Albuterol as needed  . Bladder infection    taking Keflex daily   . Chronic back pain    reason unknown  . Diabetes mellitus    takes Metformin and Tradjenta daily  . GERD (gastroesophageal reflux disease)    takes Omeprazole daily  . Hard of hearing   . Headache    daily.Takes Excedrine daily  . Hepatitis C   . History of blood transfusion    no abnormal reaction noted  . Hyperlipidemia    takes Atorvastatin daily  . Hypertension    takes Quinapril daily  . Joint pain   . Joint swelling   . Nocturia   . Obesity   . Osteoarthritis of left knee 02/23/2012  . Seasonal allergies    takes Claritin and Singulair daily.Nasal spray as needed  . Shortness of breath dyspnea    with exertion    Past Surgical History:  Procedure Laterality Date  . ABDOMINAL HYSTERECTOMY    . INJECTION KNEE  04/09/2012   Procedure: KNEE INJECTION;  Surgeon: Johnny Bridge, MD;  Location: [place];  Service: Orthopedics;  Laterality: Left;  . JOINT REPLACEMENT     acl   bil knees  . KNEE ARTHROTOMY Left 08/28/2015   Procedure: LEFT KNEE ARTHROFIBROSIS EXCISION; pylectomy;  Surgeon: Meredith Pel, MD;  Location: [place];  Service: Orthopedics;  Laterality: Left;  . KNEE CLOSED REDUCTION  04/09/2012   Procedure: CLOSED MANIPULATION KNEE;  Surgeon: Johnny Bridge, MD;  Location: [place];  Service: Orthopedics;  Laterality: Left;  Manipulation Knee with Anesthesia includes Application of Traction   . TOTAL KNEE ARTHROPLASTY  02/23/2012   Procedure: TOTAL KNEE ARTHROPLASTY;  Surgeon: Johnny Bridge, MD;  Location: [place];  Service: Orthopedics;  Laterality: Left;    There were no vitals filed for this visit.   Subjective Assessment - 02/03/20 1054    Subjective Patient reports continued foot soreness, states it is feeling a little better.    Patient Stated Goals Get rid of pain so she can move better    Currently in Pain? Yes    Pain Score 3     Pain Location Foot    Pain Orientation Left    Pain Descriptors / Indicators Sore    Pain Type Chronic pain    Pain Onset More than a month ago    Pain Frequency Intermittent    Pain Score 5    Pain Location Knee    Pain Orientation Left    Pain Descriptors /  Indicators Aching    Pain Type Chronic pain    Pain Onset More than a month ago    Pain Frequency Constant    Pain Score 5    Pain Location Back    Pain Orientation Lower    Pain Descriptors / Indicators Aching    Pain Type Chronic pain    Pain Onset More than a month ago    Pain Frequency Constant                             [facility] Adult PT Treatment/Exercise - 02/03/20 0001      Exercises   Exercises Knee/Hip;Ankle      Knee/Hip Exercises: Standing   Hip Abduction 3 sets;10 reps    Abduction Limitations yellow around knees    Hip Extension 3 sets;10 reps    Extension Limitations yellow around knees      Ankle Exercises: Stretches   Gastroc Stretch 30 seconds    Slant Board Stretch 3 reps;30 seconds    Other Stretch Standing great toe flexion and extension stretching x 30 sec each      Ankle Exercises: Standing    Heel Raises 10 reps   3 sets   Heel Raises Limitations ball squeeze between heels, last 2 sets on edge of step      Ankle Exercises: Supine   T-Band Inversion with yellow 2 x 20                  PT Education - 02/03/20 1056    Education Details HEP, stretching    Person(s) Educated Patient    Methods Explanation    Comprehension Verbalized understanding;Need further instruction            PT Short Term Goals - 01/16/20 1105      PT SHORT TERM GOAL #1   Title Patient will be I with initial HEP to progress with PT    Time 4    Period Weeks    Status New    Target Date 02/13/20      PT SHORT TERM GOAL #2   Title Patient will exhibit >/= 5 deg left ankle dorsiflexion to improve gait    Time 4    Period Weeks    Status New    Target Date 02/13/20      PT SHORT TERM GOAL #3   Title Patient will be able to ambulation household distances without CAM boot and </= 5/10 pain to improve mobility    Time 4    Period Weeks    Status New    Target Date 02/13/20             PT Long Term Goals - 01/16/20 1106      PT LONG TERM GOAL #1   Title Patient will be I with final HEP to maintain progress from PT    Time 8    Period Weeks    Status New    Target Date 03/12/20      PT LONG TERM GOAL #2   Title Patient will exhibit >/= 45 deg knee flexion to improve transfer ability    Time 8    Period Weeks    Status New    Target Date 03/12/20      PT LONG TERM GOAL #3   Title Patient will exhibit left ankle strength grossly 5/5 MMT to improve walking tolerance and arch control  Time 8    Period Weeks    Status New    Target Date 03/12/20      PT LONG TERM GOAL #4   Title Patient will be able to ambulate community level distance with LRAD and </= 5/10 pain level to improve grocery shopping    Time 8    Period Weeks    Status New    Target Date 03/12/20      PT LONG TERM GOAL #5   Title Patient will report improve functional level of </= 42% limitation of  FOTO    Time 8    Period Weeks    Status New    Target Date 03/12/20                 Plan - 02/03/20 1057    Clinical Impression Statement Patient tolerated therapy well with no adverse effects. Today's visit focused on strengthening for ankle and hips and progressing her ankle and great toe motion to improve gait. She does exhibit improve right ankle dorsiflexion but does have some great toe limitation with reported 1st MTP pain so provided her with stretching exercises to work on at home. She may benefit from orthotist referral for shoe evaluation, rocker bottom may be beneficial for her. She would benefit from continued skilled PT to progress her strength and motion in order to improve her walking and maximize functional mobility.    PT Treatment/Interventions ADLs/Self Care Home Management;Aquatic Therapy;Cryotherapy;Electrical Stimulation;Iontophoresis 4mg /ml Dexamethasone;Moist Heat;Neuromuscular re-education;Balance training;Therapeutic exercise;Therapeutic activities;Functional mobility training;Stair training;Gait training;Patient/family education;Manual techniques;Dry needling;Passive range of motion;Taping;Joint Manipulations    PT Next Visit Plan Assess HEP and progress PRN, progress left calf flexibility and ankle strength, manual and stretching for left knee range of motion as able    PT Home Exercise Plan [email or web address]: seated calf stretch with strap, seated banded PF and inversion with yellow, seated towel scrunch, seated plantar fascia mobilization with ball, standing heel raises, SLR, sidelying hip abduction, prone hip extension, seated lumbar flexion stretch, standing calf and great toe stretching    Consulted and Agree with Plan of Care Patient           Patient will benefit from skilled therapeutic intervention in order to improve the following deficits and impairments:  Abnormal gait, Decreased range of motion, Difficulty walking, Pain, Decreased activity tolerance,  Decreased balance, Improper body mechanics, Decreased strength, Decreased mobility, Impaired flexibility  Visit Diagnosis: Pain in left foot  Chronic pain of left knee  Stiffness of left knee, not elsewhere classified  Chronic bilateral low back pain, unspecified whether sciatica present  Muscle weakness (generalized)  Other abnormalities of gait and mobility     Problem List Patient Active Problem List   Diagnosis Date Noted  . Pain in left knee 09/13/2019  . 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 Abnormal EKG 03/02/2019  . Incomplete left bundle branch block (LBBB) 03/02/2019  . Rebound headache 12/13/2018  . Overuse of medication 12/13/2018  . Intractable chronic migraine without aura and without status migrainosus 12/13/2018  . Microcytic anemia 02/24/2018  . High risk medication use 08/06/2017  . Seasonal and perennial allergic rhinitis 07/30/2017  . Recurrent infections 07/30/2017  . Cough 06/08/2017  . Moderate persistent asthma without complication 95/62/1308  . Chronic constipation 02/04/2017  . Hyperlipidemia due to type 2 diabetes mellitus ([place]) 02/04/2017  . Hallux rigidus, left foot 01/01/2017  . Postmenopausal symptoms 02/22/2016  . Arthrofibrosis of knee joint 08/28/2015  . Left hip pain  10/08/2014  . Type 2 diabetes mellitus without complication, without long-term current use of insulin ([place]) 10/08/2014  . Hyperlipidemia LDL goal <100 10/08/2014  . Essential hypertension, benign 08/30/2014  . Diabetes mellitus, type 2 ([place]) 08/30/2014  . Diverticulosis of colon without hemorrhage 08/30/2014  . Chronic low back pain 08/30/2014  . Chronic pain syndrome 08/30/2014  . Asthma in adult without complication 64/40/3474  . 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 Osteoarthritis of left knee 02/23/2012    Hilda Blades, PT, DPT, LAT, ATC 02/03/20  12:13 PM Phone: [phone] Fax:  [place] [facility] [address] [place], [place], [address] Phone: [phone]   Fax:  [phone]  Name: Tabitha Oneal MRN: [id number] Date of Birth: Feb 08, 1956

## 2020-02-03 NOTE — Patient Instructions (Signed)
Access Code: [email or web address] URL: [email or web address] Date: 02/03/2020 Prepared by: Hilda Blades  Exercises Seated Calf Stretch with Strap - 3 x daily - 7 x weekly - 3 reps - 30 seconds hold Seated Ankle Plantar Flexion with Resistance Loop - 1 x daily - 7 x weekly - 3 sets - 20 reps Seated Ankle Inversion with Resistance and Legs Crossed - 1 x daily - 7 x weekly - 3 sets - 20 reps Seated Toe Towel Scrunches - 1 x daily - 7 x weekly - 3 sets - 20 reps Seated Plantar Fascia Mobilization with Small Ball - 3 x daily - 7 x weekly - 2-3 minutes hold Active Straight Leg Raise with Quad Set - 1 x daily - 7 x weekly - 3 sets - 10 reps Sidelying Hip Abduction - 1 x daily - 7 x weekly - 3 sets - 10 reps Prone Hip Extension - 1 x daily - 7 x weekly - 3 sets - 10 reps Heel rises with counter support - 1 x daily - 7 x weekly - 3 sets - 10 reps Standing Gastroc Stretch at Counter - 2 x daily - 7 x weekly - 3 reps - 30 seconds hold Standing Toe Dorsiflexion Stretch - 2 x daily - 7 x weekly - 2 reps - 30 seconds hold Standing Ankle Dorsiflexor Toe Extensor Stretch - 2 x daily - 7 x weekly - 2 reps - 30 seconds hold Seated Lumbar Flexion Stretch - 3 x daily - 7 x weekly - 5 reps - 10 seconds hold

## 2020-02-06 ENCOUNTER — Ambulatory Visit: Payer: Medicare Other | Admitting: Physical Therapy

## 2020-02-06 ENCOUNTER — Other Ambulatory Visit: Payer: Self-pay

## 2020-02-06 ENCOUNTER — Encounter: Payer: Self-pay | Admitting: Physical Therapy

## 2020-02-06 DIAGNOSIS — M545 Low back pain, unspecified: Secondary | ICD-10-CM

## 2020-02-06 DIAGNOSIS — G8929 Other chronic pain: Secondary | ICD-10-CM

## 2020-02-06 DIAGNOSIS — M79672 Pain in left foot: Secondary | ICD-10-CM | POA: Diagnosis not present

## 2020-02-06 DIAGNOSIS — M6281 Muscle weakness (generalized): Secondary | ICD-10-CM

## 2020-02-06 DIAGNOSIS — M25662 Stiffness of left knee, not elsewhere classified: Secondary | ICD-10-CM

## 2020-02-06 DIAGNOSIS — R2689 Other abnormalities of gait and mobility: Secondary | ICD-10-CM

## 2020-02-06 NOTE — Therapy (Signed)
[place], [place], [address] Phone: [phone]   Fax:  [phone]  Physical Therapy Treatment  Patient Details  Name: Tabitha Oneal MRN: [id number] Date of Birth: [birth year] Referring Provider (PT): Landis Martins, [place]   Encounter Date: 02/06/2020   PT End of Session - 02/06/20 1118    Visit Number 6    Number of Visits 16    Date for PT Re-Evaluation 03/12/20    Authorization Type UHC MCR    Progress Note Due on Visit 10    PT Start Time 1046    PT Stop Time 1145   15 minutes cryo post therapy   PT Time Calculation (min) 59 min    Activity Tolerance Patient tolerated treatment well    Behavior During Therapy [facility] for tasks assessed/performed           Past Medical History:  Diagnosis Date  . Asthma    QVAR daily and Albuterol as needed  . Bladder infection    taking Keflex daily   . Chronic back pain    reason unknown  . Diabetes mellitus    takes Metformin and Tradjenta daily  . GERD (gastroesophageal reflux disease)    takes Omeprazole daily  . Hard of hearing   . Headache    daily.Takes Excedrine daily  . Hepatitis C   . History of blood transfusion    no abnormal reaction noted  . Hyperlipidemia    takes Atorvastatin daily  . Hypertension    takes Quinapril daily  . Joint pain   . Joint swelling   . Nocturia   . Obesity   . Osteoarthritis of left knee 02/23/2012  . Seasonal allergies    takes Claritin and Singulair daily.Nasal spray as needed  . Shortness of breath dyspnea    with exertion    Past Surgical History:  Procedure Laterality Date  . ABDOMINAL HYSTERECTOMY    . INJECTION KNEE  04/09/2012   Procedure: KNEE INJECTION;  Surgeon: Johnny Bridge, MD;  Location: [place];  Service: Orthopedics;  Laterality: Left;  . JOINT REPLACEMENT     acl   bil knees  . KNEE ARTHROTOMY Left 08/28/2015   Procedure: LEFT KNEE ARTHROFIBROSIS EXCISION;  pylectomy;  Surgeon: Meredith Pel, MD;  Location: [place];  Service: Orthopedics;  Laterality: Left;  . KNEE CLOSED REDUCTION  04/09/2012   Procedure: CLOSED MANIPULATION KNEE;  Surgeon: Johnny Bridge, MD;  Location: [place];  Service: Orthopedics;  Laterality: Left;  Manipulation Knee with Anesthesia includes Application of Traction   . TOTAL KNEE ARTHROPLASTY  02/23/2012   Procedure: TOTAL KNEE ARTHROPLASTY;  Surgeon: Johnny Bridge, MD;  Location: [place];  Service: Orthopedics;  Laterality: Left;    There were no vitals filed for this visit.   Subjective Assessment - 02/06/20 1107    Subjective Patient reports she is doing well, she is achy this morning.    Patient Stated Goals Get rid of pain so she can move better    Currently in Pain? Yes    Pain Score 3     Pain Location Foot    Pain Orientation Left    Pain Descriptors / Indicators Aching    Pain Type Chronic pain    Pain Onset More than a month ago    Pain Frequency Intermittent    Pain Score 5    Pain Location Knee    Pain Orientation Left  Pain Descriptors / Indicators Aching    Pain Type Chronic pain    Pain Onset More than a month ago    Pain Frequency Constant    Pain Score 4    Pain Location Back    Pain Orientation Lower    Pain Descriptors / Indicators Aching    Pain Type Chronic pain    Pain Onset More than a month ago    Pain Frequency Constant                             [facility] Adult PT Treatment/Exercise - 02/06/20 0001      Exercises   Exercises Knee/Hip;Ankle      Knee/Hip Exercises: Aerobic   Nustep L3 x 4 min with UE and LE - focus on left knee motion      Knee/Hip Exercises: Standing   Hip Abduction 3 sets;10 reps    Abduction Limitations red around knees    Hip Extension 3 sets;10 reps    Extension Limitations red around knees      Knee/Hip Exercises: Supine   Straight Leg Raises 3 sets;10 reps      Modalities   Modalities Cryotherapy       Cryotherapy   Number Minutes Cryotherapy 15 Minutes   post-therapy   Cryotherapy Location Knee;Ankle   left   Type of Cryotherapy Ice pack      Ankle Exercises: Stretches   Slant Board Stretch 3 reps;30 seconds    Other Stretch Standing great toe flexion and extension stretching x 30 sec each      Ankle Exercises: Standing   SLS SLS 3 x 15 seconds each, tandem stance 3 x 20 sec each    Heel Raises 10 reps   4 sets   Heel Raises Limitations ball squeeze between heels, on edge of step                  PT Education - 02/06/20 1118    Education Details HEP, balance    Person(s) Educated Patient    Methods Explanation;Demonstration;Verbal cues;Handout    Comprehension Verbalized understanding;Returned demonstration;Verbal cues required;Need further instruction            PT Short Term Goals - 01/16/20 1105      PT SHORT TERM GOAL #1   Title Patient will be I with initial HEP to progress with PT    Time 4    Period Weeks    Status New    Target Date 02/13/20      PT SHORT TERM GOAL #2   Title Patient will exhibit >/= 5 deg left ankle dorsiflexion to improve gait    Time 4    Period Weeks    Status New    Target Date 02/13/20      PT SHORT TERM GOAL #3   Title Patient will be able to ambulation household distances without CAM boot and </= 5/10 pain to improve mobility    Time 4    Period Weeks    Status New    Target Date 02/13/20             PT Long Term Goals - 01/16/20 1106      PT LONG TERM GOAL #1   Title Patient will be I with final HEP to maintain progress from PT    Time 8    Period Weeks    Status New    Target  Date 03/12/20      PT LONG TERM GOAL #2   Title Patient will exhibit >/= 45 deg knee flexion to improve transfer ability    Time 8    Period Weeks    Status New    Target Date 03/12/20      PT LONG TERM GOAL #3   Title Patient will exhibit left ankle strength grossly 5/5 MMT to improve walking tolerance and arch control    Time  8    Period Weeks    Status New    Target Date 03/12/20      PT LONG TERM GOAL #4   Title Patient will be able to ambulate community level distance with LRAD and </= 5/10 pain level to improve grocery shopping    Time 8    Period Weeks    Status New    Target Date 03/12/20      PT LONG TERM GOAL #5   Title Patient will report improve functional level of </= 42% limitation of FOTO    Time 8    Period Weeks    Status New    Target Date 03/12/20                 Plan - 02/06/20 1119    Clinical Impression Statement Patient tolerated therapy well with no adverse effects. Continued main focus on strengthening for hips and left ankle/knee. Initiated balance training this visit with good tolerance. Patient does exhibit balance deficit bilaterally and reports she leans to the right when walking. Updated HEP to progress strengthening and incorporate balance. She would benefit from continued skilled PT to progress her strength and motion in order to improve her walking and maximize functional mobility.    PT Treatment/Interventions ADLs/Self Care Home Management;Aquatic Therapy;Cryotherapy;Electrical Stimulation;Iontophoresis 4mg /ml Dexamethasone;Moist Heat;Neuromuscular re-education;Balance training;Therapeutic exercise;Therapeutic activities;Functional mobility training;Stair training;Gait training;Patient/family education;Manual techniques;Dry needling;Passive range of motion;Taping;Joint Manipulations    PT Next Visit Plan Assess HEP and progress PRN, progress left calf flexibility and ankle strength, manual and stretching for left knee range of motion as able    PT Home Exercise Plan [email or web address]: seated banded inversion with yellow, seated towel scrunch, seated plantar fascia mobilization with ball, standing heel raises, SLR, standing hip abduction and extension with red, seated lumbar flexion stretch, standing calf and great toe stretching    Consulted and Agree with Plan of Care Patient            Patient will benefit from skilled therapeutic intervention in order to improve the following deficits and impairments:  Abnormal gait, Decreased range of motion, Difficulty walking, Pain, Decreased activity tolerance, Decreased balance, Improper body mechanics, Decreased strength, Decreased mobility, Impaired flexibility  Visit Diagnosis: Pain in left foot  Chronic pain of left knee  Stiffness of left knee, not elsewhere classified  Chronic bilateral low back pain, unspecified whether sciatica present  Muscle weakness (generalized)  Other abnormalities of gait and mobility     Problem List Patient Active Problem List   Diagnosis Date Noted  . Pain in left knee 09/13/2019  . 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 Abnormal EKG 03/02/2019  . Incomplete left bundle branch block (LBBB) 03/02/2019  . Rebound headache 12/13/2018  . Overuse of medication 12/13/2018  . Intractable chronic migraine without aura and without status migrainosus 12/13/2018  . Microcytic anemia 02/24/2018  . High risk medication use 08/06/2017  . Seasonal and perennial allergic rhinitis 07/30/2017  . Recurrent infections 07/30/2017  . Cough  06/08/2017  . Moderate persistent asthma without complication 57/26/2035  . Chronic constipation 02/04/2017  . Hyperlipidemia due to type 2 diabetes mellitus ([place]) 02/04/2017  . Hallux rigidus, left foot 01/01/2017  . Postmenopausal symptoms 02/22/2016  . Arthrofibrosis of knee joint 08/28/2015  . Left hip pain 10/08/2014  . Type 2 diabetes mellitus without complication, without long-term current use of insulin ([place]) 10/08/2014  . Hyperlipidemia LDL goal <100 10/08/2014  . Essential hypertension, benign 08/30/2014  . Diabetes mellitus, type 2 ([place]) 08/30/2014  . Diverticulosis of colon without hemorrhage 08/30/2014  . Chronic low back pain 08/30/2014  . Chronic pain syndrome 08/30/2014  . Asthma in adult without  complication 59/74/1638  . 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 Osteoarthritis of left knee 02/23/2012    Hilda Blades, PT, DPT, LAT, ATC 02/06/20  11:39 AM Phone: [phone] Fax: [facility] [address] [place], [place], [address] Phone: [phone]   Fax:  [phone]  Name: Tabitha Oneal MRN: [id number] Date of Birth: Aug 23, 1955

## 2020-02-06 NOTE — Patient Instructions (Signed)
Access Code: [email or web address] URL: [email or web address] Date: 02/06/2020 Prepared by: Hilda Blades  Exercises Active Straight Leg Raise with Quad Set - 1 x daily - 7 x weekly - 3 sets - 10 reps Seated Ankle Inversion with Resistance and Legs Crossed - 1 x daily - 7 x weekly - 3 sets - 20 reps Seated Toe Towel Scrunches - 1 x daily - 7 x weekly - 3 sets - 20 reps Seated Plantar Fascia Mobilization with Small Ball - 3 x daily - 7 x weekly - 2-3 minutes hold Seated Lumbar Flexion Stretch - 3 x daily - 7 x weekly - 5 reps - 10 seconds hold Standing Hip Abduction with Resistance at Ankles and Counter Support - 1 x daily - 7 x weekly - 3 sets - 10 reps Standing Hip Extension with Resistance at Ankles and Counter Support - 1 x daily - 7 x weekly - 3 sets - 10 reps Heel rises with counter support - 1 x daily - 7 x weekly - 3 sets - 10 reps Standing Gastroc Stretch at Counter - 2 x daily - 7 x weekly - 3 reps - 30 seconds hold Standing Toe Dorsiflexion Stretch - 2 x daily - 7 x weekly - 2 reps - 30 seconds hold Standing Ankle Dorsiflexor Toe Extensor Stretch - 2 x daily - 7 x weekly - 2 reps - 30 seconds hold Standing Tandem Balance with Counter Support - 1 x daily - 7 x weekly - 3 reps - 30 seconds hold

## 2020-02-10 ENCOUNTER — Other Ambulatory Visit: Payer: Self-pay

## 2020-02-10 ENCOUNTER — Ambulatory Visit: Payer: Medicare Other | Admitting: Physical Therapy

## 2020-02-10 ENCOUNTER — Encounter: Payer: Self-pay | Admitting: Physical Therapy

## 2020-02-10 DIAGNOSIS — G8929 Other chronic pain: Secondary | ICD-10-CM

## 2020-02-10 DIAGNOSIS — M79672 Pain in left foot: Secondary | ICD-10-CM

## 2020-02-10 DIAGNOSIS — R2689 Other abnormalities of gait and mobility: Secondary | ICD-10-CM

## 2020-02-10 DIAGNOSIS — M6281 Muscle weakness (generalized): Secondary | ICD-10-CM

## 2020-02-10 DIAGNOSIS — M545 Low back pain, unspecified: Secondary | ICD-10-CM

## 2020-02-10 DIAGNOSIS — M25662 Stiffness of left knee, not elsewhere classified: Secondary | ICD-10-CM

## 2020-02-10 NOTE — Patient Instructions (Signed)
Access Code: [email or web address] URL: [email or web address] Date: 02/10/2020 Prepared by: Hilda Blades  Exercises Active Straight Leg Raise with Quad Set - 1 x daily - 3 x weekly - 3 sets - 10 reps Seated Ankle Inversion with Resistance and Legs Crossed - 1 x daily - 3 x weekly - 3 sets - 20 reps Seated Toe Towel Scrunches - 1 x daily - 3 x weekly - 3 sets - 20 reps Seated Plantar Fascia Mobilization with Small Ball - 1 x daily - 7 x weekly - 2-3 minutes hold Seated Lumbar Flexion Stretch - 2 x daily - 7 x weekly - 5 reps - 10 seconds hold Seated Trunk Rotation - Arms Crossed - 2 x daily - 7 x weekly - 10 reps - 5 seconds hold Standing Hip Abduction with Resistance at Ankles and Counter Support - 1 x daily - 3 x weekly - 3 sets - 10 reps Standing Hip Extension with Resistance at Ankles and Counter Support - 1 x daily - 3 x weekly - 3 sets - 10 reps Heel rises with counter support - 1 x daily - 3 x weekly - 3 sets - 10 reps Standing Gastroc Stretch at Counter - 1 x daily - 7 x weekly - 3 reps - 30 seconds hold Standing Toe Dorsiflexion Stretch - 1 x daily - 7 x weekly - 2 reps - 30 seconds hold Standing Ankle Dorsiflexor Toe Extensor Stretch - 1 x daily - 7 x weekly - 2 reps - 30 seconds hold Standing Tandem Balance with Counter Support - 1 x daily - 7 x weekly - 3 reps - 30 seconds hold

## 2020-02-10 NOTE — Therapy (Signed)
[place], [place], [address] Phone: [phone]   Fax:  [phone]  Physical Therapy Treatment  Patient Details  Name: Tabitha Oneal MRN: [id number] Date of Birth: Apr 19, 1956 Referring Provider (PT): Landis Martins, [place]   Encounter Date: 02/10/2020   PT End of Session - 02/10/20 1048    Visit Number 7    Number of Visits 16    Date for PT Re-Evaluation 03/12/20    Authorization Type UHC MCR    Progress Note Due on Visit 10    PT Start Time 6962    PT Stop Time 1120    PT Time Calculation (min) 40 min    Activity Tolerance Patient tolerated treatment well    Behavior During Therapy [facility] for tasks assessed/performed           Past Medical History:  Diagnosis Date  . Asthma    QVAR daily and Albuterol as needed  . Bladder infection    taking Keflex daily   . Chronic back pain    reason unknown  . Diabetes mellitus    takes Metformin and Tradjenta daily  . GERD (gastroesophageal reflux disease)    takes Omeprazole daily  . Hard of hearing   . Headache    daily.Takes Excedrine daily  . Hepatitis C   . History of blood transfusion    no abnormal reaction noted  . Hyperlipidemia    takes Atorvastatin daily  . Hypertension    takes Quinapril daily  . Joint pain   . Joint swelling   . Nocturia   . Obesity   . Osteoarthritis of left knee 02/23/2012  . Seasonal allergies    takes Claritin and Singulair daily.Nasal spray as needed  . Shortness of breath dyspnea    with exertion    Past Surgical History:  Procedure Laterality Date  . ABDOMINAL HYSTERECTOMY    . INJECTION KNEE  04/09/2012   Procedure: KNEE INJECTION;  Surgeon: Johnny Bridge, MD;  Location: [place];  Service: Orthopedics;  Laterality: Left;  . JOINT REPLACEMENT     acl   bil knees  . KNEE ARTHROTOMY Left 08/28/2015   Procedure: LEFT KNEE ARTHROFIBROSIS EXCISION; pylectomy;  Surgeon: Meredith Pel, MD;  Location: [place];  Service: Orthopedics;  Laterality: Left;  . KNEE CLOSED REDUCTION  04/09/2012   Procedure: CLOSED MANIPULATION KNEE;  Surgeon: Johnny Bridge, MD;  Location: [place];  Service: Orthopedics;  Laterality: Left;  Manipulation Knee with Anesthesia includes Application of Traction   . TOTAL KNEE ARTHROPLASTY  02/23/2012   Procedure: TOTAL KNEE ARTHROPLASTY;  Surgeon: Johnny Bridge, MD;  Location: [place];  Service: Orthopedics;  Laterality: Left;    There were no vitals filed for this visit.   Subjective Assessment - 02/10/20 1044    Subjective Patient reports she is doing about the same. She notes that a sharp pain on the bottom of her foot woke her up, she was able to get back to bed.    Patient Stated Goals Get rid of pain so she can move better    Currently in Pain? Yes    Pain Score 5     Pain Location Foot    Pain Orientation Left    Pain Descriptors / Indicators Aching;Throbbing    Pain Type Chronic pain    Pain Onset More than a month ago    Pain Frequency Intermittent    Pain  Score 6    Pain Location Knee    Pain Orientation Left    Pain Descriptors / Indicators Aching    Pain Type Chronic pain    Pain Onset More than a month ago    Pain Frequency Constant    Pain Score 5    Pain Location Back    Pain Orientation Lower    Pain Descriptors / Indicators Aching    Pain Type Chronic pain    Pain Onset More than a month ago    Pain Frequency Constant                             [facility] Adult PT Treatment/Exercise - 02/10/20 0001      Neuro Re-ed    Neuro Re-ed Details  Tandem stand 4 x 30 sec      Exercises   Exercises Knee/Hip;Ankle      Knee/Hip Exercises: Aerobic   Nustep L3 x 3 min with UE and LE - focus on left knee motion      Knee/Hip Exercises: Standing   Hip Abduction 2 sets;10 reps    Abduction Limitations red around knees    Hip Extension 2 sets;10 reps    Extension Limitations red  around knees      Manual Therapy   Manual Therapy Taping    Manual therapy comments Arch tape low dye      Ankle Exercises: Stretches   Slant Board Stretch 3 reps;30 seconds    Other Stretch Standing great toe flexion and extension stretching x 30 sec each      Ankle Exercises: Standing   Heel Raises 10 reps   3 sets   Heel Raises Limitations ball squeeze between heels, on edge of step                  PT Education - 02/10/20 1047    Education Details HEP, arch support vs changes to shoe    Person(s) Educated Patient    Methods Explanation;Handout    Comprehension Verbalized understanding;Need further instruction            PT Short Term Goals - 01/16/20 1105      PT SHORT TERM GOAL #1   Title Patient will be I with initial HEP to progress with PT    Time 4    Period Weeks    Status New    Target Date 02/13/20      PT SHORT TERM GOAL #2   Title Patient will exhibit >/= 5 deg left ankle dorsiflexion to improve gait    Time 4    Period Weeks    Status New    Target Date 02/13/20      PT SHORT TERM GOAL #3   Title Patient will be able to ambulation household distances without CAM boot and </= 5/10 pain to improve mobility    Time 4    Period Weeks    Status New    Target Date 02/13/20             PT Long Term Goals - 01/16/20 1106      PT LONG TERM GOAL #1   Title Patient will be I with final HEP to maintain progress from PT    Time 8    Period Weeks    Status New    Target Date 03/12/20      PT LONG TERM GOAL #2  Title Patient will exhibit >/= 45 deg knee flexion to improve transfer ability    Time 8    Period Weeks    Status New    Target Date 03/12/20      PT LONG TERM GOAL #3   Title Patient will exhibit left ankle strength grossly 5/5 MMT to improve walking tolerance and arch control    Time 8    Period Weeks    Status New    Target Date 03/12/20      PT LONG TERM GOAL #4   Title Patient will be able to ambulate community level  distance with LRAD and </= 5/10 pain level to improve grocery shopping    Time 8    Period Weeks    Status New    Target Date 03/12/20      PT LONG TERM GOAL #5   Title Patient will report improve functional level of </= 42% limitation of FOTO    Time 8    Period Weeks    Status New    Target Date 03/12/20                 Plan - 02/10/20 1050    Clinical Impression Statement Patient tolerated therapy well with no adverse effects. She reported slight increase in pain this visit. Patient does exhibit flat arch while weight bearing and her shoes do not provide much arch support so trialed arch tap this visit to see if this helped reduce foot pain. Patient reported improvement in foot pain with walking following tape so she may benefit from arch support inserts. Continued with stretching, strengthening and balance with good tolerance. Reviewed HEP to ensure proper form. She would benefit from continued skilled PT to progress her strength and motion in order to improve her walking and maximize functional mobility.    PT Treatment/Interventions ADLs/Self Care Home Management;Aquatic Therapy;Cryotherapy;Electrical Stimulation;Iontophoresis 4mg /ml Dexamethasone;Moist Heat;Neuromuscular re-education;Balance training;Therapeutic exercise;Therapeutic activities;Functional mobility training;Stair training;Gait training;Patient/family education;Manual techniques;Dry needling;Passive range of motion;Taping;Joint Manipulations    PT Next Visit Plan Assess HEP and progress PRN, progress left calf flexibility and ankle strength, manual and stretching for left knee range of motion as able    PT Home Exercise Plan [email or web address]: seated banded inversion with yellow, seated towel scrunch, seated plantar fascia mobilization with ball, standing heel raises, SLR, standing hip abduction and extension with red, seated lumbar flexion and rotation stretch, standing calf and great toe stretching    Consulted and Agree with  Plan of Care Patient           Patient will benefit from skilled therapeutic intervention in order to improve the following deficits and impairments:  Abnormal gait, Decreased range of motion, Difficulty walking, Pain, Decreased activity tolerance, Decreased balance, Improper body mechanics, Decreased strength, Decreased mobility, Impaired flexibility  Visit Diagnosis: Pain in left foot  Chronic pain of left knee  Stiffness of left knee, not elsewhere classified  Chronic bilateral low back pain, unspecified whether sciatica present  Muscle weakness (generalized)  Other abnormalities of gait and mobility     Problem List Patient Active Problem List   Diagnosis Date Noted  . Pain in left knee 09/13/2019  . 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 Abnormal EKG 03/02/2019  . Incomplete left bundle branch block (LBBB) 03/02/2019  . Rebound headache 12/13/2018  . Overuse of medication 12/13/2018  . Intractable chronic migraine without aura and without status migrainosus 12/13/2018  . Microcytic anemia 02/24/2018  . High risk  medication use 08/06/2017  . Seasonal and perennial allergic rhinitis 07/30/2017  . Recurrent infections 07/30/2017  . Cough 06/08/2017  . Moderate persistent asthma without complication 18/40/3754  . Chronic constipation 02/04/2017  . Hyperlipidemia due to type 2 diabetes mellitus ([place]) 02/04/2017  . Hallux rigidus, left foot 01/01/2017  . Postmenopausal symptoms 02/22/2016  . Arthrofibrosis of knee joint 08/28/2015  . Left hip pain 10/08/2014  . Type 2 diabetes mellitus without complication, without long-term current use of insulin ([place]) 10/08/2014  . Hyperlipidemia LDL goal <100 10/08/2014  . Essential hypertension, benign 08/30/2014  . Diabetes mellitus, type 2 ([place]) 08/30/2014  . Diverticulosis of colon without hemorrhage 08/30/2014  . Chronic low back pain 08/30/2014  . Chronic pain syndrome 08/30/2014  . Asthma in  adult without complication 36/10/7701  . 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 Osteoarthritis of left knee 02/23/2012    Hilda Blades, PT, DPT, LAT, ATC 02/10/20  11:28 AM Phone: [phone] Fax: [facility] [address] [place], [place], [address] Phone: [phone]   Fax:  [phone]  Name: Tabitha Oneal MRN: [id number] Date of Birth: 12-Jul-1955

## 2020-02-13 ENCOUNTER — Ambulatory Visit: Payer: Medicare Other | Admitting: Physical Therapy

## 2020-02-13 ENCOUNTER — Encounter: Payer: Self-pay | Admitting: Physical Therapy

## 2020-02-13 ENCOUNTER — Other Ambulatory Visit: Payer: Self-pay

## 2020-02-13 DIAGNOSIS — M79672 Pain in left foot: Secondary | ICD-10-CM

## 2020-02-13 DIAGNOSIS — R2689 Other abnormalities of gait and mobility: Secondary | ICD-10-CM

## 2020-02-13 DIAGNOSIS — M25662 Stiffness of left knee, not elsewhere classified: Secondary | ICD-10-CM

## 2020-02-13 DIAGNOSIS — M6281 Muscle weakness (generalized): Secondary | ICD-10-CM

## 2020-02-13 DIAGNOSIS — M25562 Pain in left knee: Secondary | ICD-10-CM

## 2020-02-13 DIAGNOSIS — G8929 Other chronic pain: Secondary | ICD-10-CM

## 2020-02-13 NOTE — Therapy (Signed)
[place], [place], [address] Phone: [phone]   Fax:  [phone]  Physical Therapy Treatment  Patient Details  Name: Tabitha Oneal MRN: [id number] Date of Birth: 02/06/1956 Referring Provider (PT): Landis Martins, [place]   Encounter Date: 02/13/2020   PT End of Session - 02/13/20 1112    Visit Number 8    Number of Visits 16    Date for PT Re-Evaluation 03/12/20    Authorization Type UHC MCR    Progress Note Due on Visit 10    PT Start Time 9892    PT Stop Time 1130    PT Time Calculation (min) 45 min    Activity Tolerance Patient tolerated treatment well    Behavior During Therapy [facility] for tasks assessed/performed           Past Medical History:  Diagnosis Date   Asthma    QVAR daily and Albuterol as needed   Bladder infection    taking Keflex daily    Chronic back pain    reason unknown   Diabetes mellitus    takes Metformin and Tradjenta daily   GERD (gastroesophageal reflux disease)    takes Omeprazole daily   Hard of hearing    Headache    daily.Takes Excedrine daily   Hepatitis C    History of blood transfusion    no abnormal reaction noted   Hyperlipidemia    takes Atorvastatin daily   Hypertension    takes Quinapril daily   Joint pain    Joint swelling    Nocturia    Obesity    Osteoarthritis of left knee 02/23/2012   Seasonal allergies    takes Claritin and Singulair daily.Nasal spray as needed   Shortness of breath dyspnea    with exertion    Past Surgical History:  Procedure Laterality Date   ABDOMINAL HYSTERECTOMY     INJECTION KNEE  04/09/2012   Procedure: KNEE INJECTION;  Surgeon: Johnny Bridge, MD;  Location: [place];  Service: Orthopedics;  Laterality: Left;   JOINT REPLACEMENT     acl   bil knees   KNEE ARTHROTOMY Left 08/28/2015   Procedure: LEFT KNEE ARTHROFIBROSIS EXCISION; pylectomy;  Surgeon: Meredith Pel, MD;  Location: [place];  Service: Orthopedics;  Laterality: Left;   KNEE CLOSED REDUCTION  04/09/2012   Procedure: CLOSED MANIPULATION KNEE;  Surgeon: Johnny Bridge, MD;  Location: [place];  Service: Orthopedics;  Laterality: Left;  Manipulation Knee with Anesthesia includes Application of Traction    TOTAL KNEE ARTHROPLASTY  02/23/2012   Procedure: TOTAL KNEE ARTHROPLASTY;  Surgeon: Johnny Bridge, MD;  Location: [place];  Service: Orthopedics;  Laterality: Left;    There were no vitals filed for this visit.   Subjective Assessment - 02/13/20 1049    Subjective Patient reports the tape for her foot helped while it stayed on. She is overall doing well.    Patient Stated Goals Get rid of pain so she can move better    Currently in Pain? Yes    Pain Score 0-No pain   7/10 with walking   Pain Location Foot    Pain Orientation Left    Pain Descriptors / Indicators Aching;Throbbing    Pain Type Chronic pain    Pain Onset More than a month ago    Pain Frequency Intermittent    Aggravating Factors  walking, standing    Pain Score 6  Pain Location Knee    Pain Orientation Left    Pain Descriptors / Indicators Aching    Pain Type Chronic pain    Pain Onset More than a month ago    Pain Frequency Constant    Pain Score 4    Pain Location Back    Pain Orientation Lower    Pain Descriptors / Indicators Aching    Pain Type Chronic pain    Pain Onset More than a month ago    Pain Frequency Intermittent              [facility] PT Assessment - 02/13/20 0001      AROM   Right Ankle Dorsiflexion 7    Left Ankle Dorsiflexion 4                         [facility] Adult PT Treatment/Exercise - 02/13/20 0001      Neuro Re-ed    Neuro Re-ed Details  Tandem stand 4 x 30 sec      Exercises   Exercises Knee/Hip;Ankle      Knee/Hip Exercises: Stretches   Other Knee/Hip Stretches Seated lumbar flexion using physioball 5 x 10 sec      Knee/Hip  Exercises: Standing   Hip Abduction 3 sets;10 reps    Abduction Limitations green around knees    Hip Extension 3 sets;10 reps    Extension Limitations green around knees      Manual Therapy   Manual Therapy Taping    Manual therapy comments Arch tape low dye using Leuko tape for improve support and duration      Ankle Exercises: Standing   Heel Raises 15 reps   3 sets   Heel Raises Limitations ball squeeze between heels, on edge of step      Ankle Exercises: Stretches   Slant Board Stretch 3 reps;30 seconds    Other Stretch Standing great toe flexion and extension stretching x 30 sec each                  PT Education - 02/13/20 1051    Education Details HEP    Person(s) Educated Patient    Methods Explanation    Comprehension Verbalized understanding;Need further instruction            PT Short Term Goals - 02/13/20 1135      PT SHORT TERM GOAL #1   Title Patient will be I with initial HEP to progress with PT    Time 4    Period Weeks    Status On-going    Target Date 02/13/20      PT SHORT TERM GOAL #2   Title Patient will exhibit >/= 5 deg left ankle dorsiflexion to improve gait    Baseline 4 deg    Time 4    Period Weeks    Status On-going    Target Date 02/13/20      PT SHORT TERM GOAL #3   Title Patient will be able to ambulation household distances without CAM boot and </= 5/10 pain to improve mobility    Baseline Patient reports 7/10 pain, reduced to 3/10 with taping    Time 4    Period Weeks    Status On-going    Target Date 02/13/20             PT Long Term Goals - 01/16/20 1106      PT LONG TERM GOAL #1  Title Patient will be I with final HEP to maintain progress from PT    Time 8    Period Weeks    Status New    Target Date 03/12/20      PT LONG TERM GOAL #2   Title Patient will exhibit >/= 45 deg knee flexion to improve transfer ability    Time 8    Period Weeks    Status New    Target Date 03/12/20      PT LONG TERM  GOAL #3   Title Patient will exhibit left ankle strength grossly 5/5 MMT to improve walking tolerance and arch control    Time 8    Period Weeks    Status New    Target Date 03/12/20      PT LONG TERM GOAL #4   Title Patient will be able to ambulate community level distance with LRAD and </= 5/10 pain level to improve grocery shopping    Time 8    Period Weeks    Status New    Target Date 03/12/20      PT LONG TERM GOAL #5   Title Patient will report improve functional level of </= 42% limitation of FOTO    Time 8    Period Weeks    Status New    Target Date 03/12/20                 Plan - 02/13/20 1112    Clinical Impression Statement Patient tolerated therapy well with no adverse effects. Continued with arch support taping and used leuko tape this visit to improve support for arch. She reports reduced pain to <3/10 with walking with tape. Patient would likely benefit from arch support inserts to reduce left foot pain. Continued gross LE strengthening and balance with good tolerance. She would benefit from continued skilled PT to progress her strength and motion in order to improve her walking and maximize functional mobility.    PT Treatment/Interventions ADLs/Self Care Home Management;Aquatic Therapy;Cryotherapy;Electrical Stimulation;Iontophoresis 4mg /ml Dexamethasone;Moist Heat;Neuromuscular re-education;Balance training;Therapeutic exercise;Therapeutic activities;Functional mobility training;Stair training;Gait training;Patient/family education;Manual techniques;Dry needling;Passive range of motion;Taping;Joint Manipulations    PT Next Visit Plan Assess HEP and progress PRN, progress left calf flexibility and ankle strength, manual and stretching for left knee range of motion as able    PT Home Exercise Plan [email or web address]: seated banded inversion with yellow, seated towel scrunch, seated plantar fascia mobilization with ball, standing heel raises, SLR, standing hip abduction and  extension with green, seated lumbar flexion and rotation stretch, standing calf and great toe stretching, tandem balance at counter    Consulted and Agree with Plan of Care Patient           Patient will benefit from skilled therapeutic intervention in order to improve the following deficits and impairments:  Abnormal gait, Decreased range of motion, Difficulty walking, Pain, Decreased activity tolerance, Decreased balance, Improper body mechanics, Decreased strength, Decreased mobility, Impaired flexibility  Visit Diagnosis: Pain in left foot  Chronic pain of left knee  Stiffness of left knee, not elsewhere classified  Chronic bilateral low back pain, unspecified whether sciatica present  Muscle weakness (generalized)  Other abnormalities of gait and mobility     Problem List Patient Active Problem List   Diagnosis Date Noted   Pain in left knee 09/13/2019   Aftercare following left knee joint replacement surgery 07/19/2019   Abnormal EKG 03/02/2019   Incomplete left bundle branch block (LBBB) 03/02/2019   Rebound headache 12/13/2018  Overuse of medication 12/13/2018   Intractable chronic migraine without aura and without status migrainosus 12/13/2018   Microcytic anemia 02/24/2018   High risk medication use 08/06/2017   Seasonal and perennial allergic rhinitis 07/30/2017   Recurrent infections 07/30/2017   Cough 06/08/2017   Moderate persistent asthma without complication 70/34/0352   Chronic constipation 02/04/2017   Hyperlipidemia due to type 2 diabetes mellitus ([place]) 02/04/2017   Hallux rigidus, left foot 01/01/2017   Postmenopausal symptoms 02/22/2016   Arthrofibrosis of knee joint 08/28/2015   Left hip pain 10/08/2014   Type 2 diabetes mellitus without complication, without long-term current use of insulin ([place]) 10/08/2014   Hyperlipidemia LDL goal <100 10/08/2014   Essential hypertension, benign 08/30/2014   Diabetes mellitus, type 2  ([place]) 08/30/2014   Diverticulosis of colon without hemorrhage 08/30/2014   Chronic low back pain 08/30/2014   Chronic pain syndrome 08/30/2014   Asthma in adult without complication 48/18/5909   Contracture of left knee, arthrofibrosis post op TKA 04/09/2012   Osteoarthritis of left knee 02/23/2012    Hilda Blades, PT, DPT, LAT, ATC 02/13/20  11:36 AM Phone: [phone] Fax: [facility] [address] [place], [place], [address] Phone: [phone]   Fax:  [phone]  Name: Tabitha Oneal MRN: [id number] Date of Birth: 02/11/1956

## 2020-02-13 NOTE — Patient Instructions (Signed)
Access Code: [email or web address] URL: [email or web address] Date: 02/13/2020 Prepared by: Hilda Blades  Exercises Active Straight Leg Raise with Quad Set - 1 x daily - 3 x weekly - 3 sets - 10 reps Seated Ankle Inversion with Resistance and Legs Crossed - 1 x daily - 3 x weekly - 3 sets - 20 reps Seated Toe Towel Scrunches - 1 x daily - 3 x weekly - 3 sets - 20 reps Seated Plantar Fascia Mobilization with Small Ball - 1 x daily - 7 x weekly - 2-3 minutes hold Seated Lumbar Flexion Stretch - 2 x daily - 7 x weekly - 5 reps - 10 seconds hold Seated Trunk Rotation - Arms Crossed - 2 x daily - 7 x weekly - 10 reps - 5 seconds hold Standing Hip Abduction with Resistance at Ankles and Counter Support - 1 x daily - 3 x weekly - 3 sets - 10 reps Standing Hip Extension with Resistance at Ankles and Counter Support - 1 x daily - 3 x weekly - 3 sets - 10 reps Heel rises with counter support - 1 x daily - 3 x weekly - 3 sets - 10 reps Standing Gastroc Stretch at Counter - 1 x daily - 7 x weekly - 3 reps - 30 seconds hold Standing Toe Dorsiflexion Stretch - 1 x daily - 7 x weekly - 2 reps - 30 seconds hold Standing Ankle Dorsiflexor Toe Extensor Stretch - 1 x daily - 7 x weekly - 2 reps - 30 seconds hold Standing Tandem Balance with Counter Support - 1 x daily - 7 x weekly - 3 reps - 30 seconds hold

## 2020-02-15 ENCOUNTER — Ambulatory Visit: Payer: Medicare Other | Admitting: Physical Therapy

## 2020-02-15 ENCOUNTER — Encounter: Payer: Self-pay | Admitting: Physical Therapy

## 2020-02-15 ENCOUNTER — Ambulatory Visit (INDEPENDENT_AMBULATORY_CARE_PROVIDER_SITE_OTHER): Payer: Medicare Other | Admitting: Nurse Practitioner

## 2020-02-15 ENCOUNTER — Other Ambulatory Visit: Payer: Self-pay

## 2020-02-15 ENCOUNTER — Encounter: Payer: Self-pay | Admitting: Nurse Practitioner

## 2020-02-15 VITALS — BP 138/70 | HR 81 | Temp 96.6°F | Ht 62.0 in | Wt 224.6 lb

## 2020-02-15 DIAGNOSIS — R2689 Other abnormalities of gait and mobility: Secondary | ICD-10-CM

## 2020-02-15 DIAGNOSIS — I10 Essential (primary) hypertension: Secondary | ICD-10-CM

## 2020-02-15 DIAGNOSIS — G43719 Chronic migraine without aura, intractable, without status migrainosus: Secondary | ICD-10-CM

## 2020-02-15 DIAGNOSIS — E785 Hyperlipidemia, unspecified: Secondary | ICD-10-CM

## 2020-02-15 DIAGNOSIS — M6281 Muscle weakness (generalized): Secondary | ICD-10-CM

## 2020-02-15 DIAGNOSIS — J4540 Moderate persistent asthma, uncomplicated: Secondary | ICD-10-CM

## 2020-02-15 DIAGNOSIS — M79672 Pain in left foot: Secondary | ICD-10-CM | POA: Diagnosis not present

## 2020-02-15 DIAGNOSIS — M25562 Pain in left knee: Secondary | ICD-10-CM

## 2020-02-15 DIAGNOSIS — E119 Type 2 diabetes mellitus without complications: Secondary | ICD-10-CM | POA: Diagnosis not present

## 2020-02-15 DIAGNOSIS — Z23 Encounter for immunization: Secondary | ICD-10-CM | POA: Diagnosis not present

## 2020-02-15 DIAGNOSIS — G8929 Other chronic pain: Secondary | ICD-10-CM

## 2020-02-15 DIAGNOSIS — M1712 Unilateral primary osteoarthritis, left knee: Secondary | ICD-10-CM

## 2020-02-15 DIAGNOSIS — E1169 Type 2 diabetes mellitus with other specified complication: Secondary | ICD-10-CM

## 2020-02-15 DIAGNOSIS — D509 Iron deficiency anemia, unspecified: Secondary | ICD-10-CM

## 2020-02-15 DIAGNOSIS — M545 Low back pain, unspecified: Secondary | ICD-10-CM

## 2020-02-15 DIAGNOSIS — M25662 Stiffness of left knee, not elsewhere classified: Secondary | ICD-10-CM

## 2020-02-15 DIAGNOSIS — R350 Frequency of micturition: Secondary | ICD-10-CM

## 2020-02-15 LAB — POCT URINALYSIS DIPSTICK
Bilirubin, UA: NEGATIVE
Glucose, UA: NEGATIVE
Ketones, UA: NEGATIVE
Nitrite, UA: NEGATIVE
Protein, UA: NEGATIVE
Spec Grav, UA: <=1.005 — AB (ref 1.010–1.025)
Urobilinogen, UA: 0.2 E.U./dL
pH, UA: 7.5 (ref 5.0–8.0)

## 2020-02-15 NOTE — Therapy (Signed)
[place], [place], [address] Phone: [phone]   Fax:  [phone]  Physical Therapy Treatment  Patient Details  Name: Tabitha Oneal MRN: [id number] Date of Birth: 04/22/1956 Referring Provider (PT): Landis Martins, [place]   Encounter Date: 02/15/2020   PT End of Session - 02/15/20 1358    Visit Number 9    Number of Visits 16    Date for PT Re-Evaluation 03/12/20    Authorization Type UHC MCR    Progress Note Due on Visit 10    PT Start Time 8768    PT Stop Time 1443    PT Time Calculation (min) 46 min    Activity Tolerance Patient tolerated treatment well    Behavior During Therapy [facility] for tasks assessed/performed           Past Medical History:  Diagnosis Date  . Asthma    QVAR daily and Albuterol as needed  . Bladder infection    taking Keflex daily   . Chronic back pain    reason unknown  . Diabetes mellitus    takes Metformin and Tradjenta daily  . GERD (gastroesophageal reflux disease)    takes Omeprazole daily  . Hard of hearing   . Headache    daily.Takes Excedrine daily  . Hepatitis C   . History of blood transfusion    no abnormal reaction noted  . Hyperlipidemia    takes Atorvastatin daily  . Hypertension    takes Quinapril daily  . Joint pain   . Joint swelling   . Nocturia   . Obesity   . Osteoarthritis of left knee 02/23/2012  . Seasonal allergies    takes Claritin and Singulair daily.Nasal spray as needed  . Shortness of breath dyspnea    with exertion    Past Surgical History:  Procedure Laterality Date  . ABDOMINAL HYSTERECTOMY    . EYE SURGERY  2021  . INJECTION KNEE  04/09/2012   Procedure: KNEE INJECTION;  Surgeon: Johnny Bridge, MD;  Location: [place];  Service: Orthopedics;  Laterality: Left;  . JOINT REPLACEMENT     acl   bil knees  . KNEE ARTHROTOMY Left 08/28/2015   Procedure: LEFT KNEE ARTHROFIBROSIS EXCISION; pylectomy;   Surgeon: Meredith Pel, MD;  Location: [place];  Service: Orthopedics;  Laterality: Left;  . KNEE CLOSED REDUCTION  04/09/2012   Procedure: CLOSED MANIPULATION KNEE;  Surgeon: Johnny Bridge, MD;  Location: [place];  Service: Orthopedics;  Laterality: Left;  Manipulation Knee with Anesthesia includes Application of Traction   . TOTAL KNEE ARTHROPLASTY  02/23/2012   Procedure: TOTAL KNEE ARTHROPLASTY;  Surgeon: Johnny Bridge, MD;  Location: [place];  Service: Orthopedics;  Laterality: Left;    There were no vitals filed for this visit.   Subjective Assessment - 02/15/20 1357    Subjective Patient reports the tape helps for the first day or 2 but then her foot will gradually start to hurt again. She also notes continued pain at 1st MTP. Her back is still bothering her.    Patient Stated Goals Get rid of pain so she can move better    Currently in Pain? Yes    Pain Score 5     Pain Location Foot    Pain Orientation Left    Pain Descriptors / Indicators Aching;Throbbing    Pain Type Chronic pain    Pain Onset More than a month ago  Pain Frequency Intermittent    Aggravating Factors  walking    Pain Score 6    Pain Location Knee    Pain Orientation Left    Pain Descriptors / Indicators Aching    Pain Type Chronic pain    Pain Onset More than a month ago    Pain Frequency Constant    Pain Score 4    Pain Location Back    Pain Orientation Lower    Pain Descriptors / Indicators Aching    Pain Type Chronic pain    Pain Onset More than a month ago    Pain Frequency Intermittent              [facility] PT Assessment - 02/15/20 0001      Assessment   Medical Diagnosis Plantar fasciitis of left foot, Left knee pain, Low back pain     Referring Provider (PT) Landis Martins, DPM      Observation/Other Assessments   Observations Patient does exhibit bunion on left      AROM   Right Ankle Dorsiflexion 7    Left Ankle Dorsiflexion 5                          [facility] Adult PT Treatment/Exercise - 02/15/20 0001      Neuro Re-ed    Neuro Re-ed Details  Tandem stance 4 x 30 sec each   moderate use of UE for assist with left back     Exercises   Exercises Knee/Hip;Ankle      Knee/Hip Exercises: Standing   Hip Abduction 2 sets;10 reps    Abduction Limitations green around knees    Hip Extension 2 sets;10 reps    Extension Limitations green around knees      Knee/Hip Exercises: Supine   Bridges 2 sets;10 reps    Bridges Limitations legs straight with feet elevated on bolster    Straight Leg Raises 2 sets;15 reps      Manual Therapy   Manual Therapy Taping    Manual therapy comments Arch tape low dye using Leuko tape for improve support and duration      Ankle Exercises: Stretches   Slant Board Stretch 3 reps;30 seconds      Ankle Exercises: Standing   Heel Raises 15 reps   3 sets   Heel Raises Limitations ball squeeze between heels, on edge of step      Ankle Exercises: Supine   T-Band Inversion with yellow 2 x 15                  PT Education - 02/15/20 1358    Education Details HEP    Person(s) Educated Patient    Methods Explanation    Comprehension Verbalized understanding;Need further instruction            PT Short Term Goals - 02/13/20 1135      PT SHORT TERM GOAL #1   Title Patient will be I with initial HEP to progress with PT    Time 4    Period Weeks    Status On-going    Target Date 02/13/20      PT SHORT TERM GOAL #2   Title Patient will exhibit >/= 5 deg left ankle dorsiflexion to improve gait    Baseline 4 deg    Time 4    Period Weeks    Status On-going    Target Date 02/13/20  PT SHORT TERM GOAL #3   Title Patient will be able to ambulation household distances without CAM boot and </= 5/10 pain to improve mobility    Baseline Patient reports 7/10 pain, reduced to 3/10 with taping    Time 4    Period Weeks    Status On-going    Target Date 02/13/20              PT Long Term Goals - 01/16/20 1106      PT LONG TERM GOAL #1   Title Patient will be I with final HEP to maintain progress from PT    Time 8    Period Weeks    Status New    Target Date 03/12/20      PT LONG TERM GOAL #2   Title Patient will exhibit >/= 45 deg knee flexion to improve transfer ability    Time 8    Period Weeks    Status New    Target Date 03/12/20      PT LONG TERM GOAL #3   Title Patient will exhibit left ankle strength grossly 5/5 MMT to improve walking tolerance and arch control    Time 8    Period Weeks    Status New    Target Date 03/12/20      PT LONG TERM GOAL #4   Title Patient will be able to ambulate community level distance with LRAD and </= 5/10 pain level to improve grocery shopping    Time 8    Period Weeks    Status New    Target Date 03/12/20      PT LONG TERM GOAL #5   Title Patient will report improve functional level of </= 42% limitation of FOTO    Time 8    Period Weeks    Status New    Target Date 03/12/20                 Plan - 02/15/20 1358    Clinical Impression Statement Patient tolerated therapy well with no adverse effects. She is continuing to improve with her ankle motion and reports reduction in symptoms while walking following application of arch tape. She would likely benefit with using arch support orthotic.Patient also notes continued pain of 1st MTP on left and she does have observable bunion, she was encouraged to continue stretching at home. There has been minimal improvement in knee symptoms. Continued focus on strengthening and balance this visit with good tolerance. No change to HEP this visit. She would benefit from continued skilled PT to progress her strength and motion in order to improve her walking and maximize functional mobility.    PT Treatment/Interventions ADLs/Self Care Home Management;Aquatic Therapy;Cryotherapy;Electrical Stimulation;Iontophoresis 4mg /ml Dexamethasone;Moist  Heat;Neuromuscular re-education;Balance training;Therapeutic exercise;Therapeutic activities;Functional mobility training;Stair training;Gait training;Patient/family education;Manual techniques;Dry needling;Passive range of motion;Taping;Joint Manipulations    PT Next Visit Plan Assess HEP and progress PRN, progress left calf flexibility and ankle strength, manual and stretching for left knee range of motion as able    PT Home Exercise Plan [email or web address]: seated banded inversion with yellow, seated towel scrunch, seated plantar fascia mobilization with ball, standing heel raises, SLR, standing hip abduction and extension with green, seated lumbar flexion and rotation stretch, standing calf and great toe stretching, tandem balance at counter    Consulted and Agree with Plan of Care Patient           Patient will benefit from skilled therapeutic intervention in order to improve the following  deficits and impairments:  Abnormal gait, Decreased range of motion, Difficulty walking, Pain, Decreased activity tolerance, Decreased balance, Improper body mechanics, Decreased strength, Decreased mobility, Impaired flexibility  Visit Diagnosis: Pain in left foot  Chronic pain of left knee  Stiffness of left knee, not elsewhere classified  Chronic bilateral low back pain, unspecified whether sciatica present  Muscle weakness (generalized)  Other abnormalities of gait and mobility     Problem List Patient Active Problem List   Diagnosis Date Noted  . Pain in left knee 09/13/2019  . 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 Abnormal EKG 03/02/2019  . Incomplete left bundle branch block (LBBB) 03/02/2019  . Rebound headache 12/13/2018  . Overuse of medication 12/13/2018  . Intractable chronic migraine without aura and without status migrainosus 12/13/2018  . Microcytic anemia 02/24/2018  . High risk medication use 08/06/2017  . Seasonal and perennial allergic rhinitis  07/30/2017  . Recurrent infections 07/30/2017  . Cough 06/08/2017  . Moderate persistent asthma without complication 38/88/2800  . Chronic constipation 02/04/2017  . Hyperlipidemia due to type 2 diabetes mellitus ([place]) 02/04/2017  . Hallux rigidus, left foot 01/01/2017  . Postmenopausal symptoms 02/22/2016  . Arthrofibrosis of knee joint 08/28/2015  . Left hip pain 10/08/2014  . Type 2 diabetes mellitus without complication, without long-term current use of insulin ([place]) 10/08/2014  . Hyperlipidemia LDL goal <100 10/08/2014  . Essential hypertension, benign 08/30/2014  . Diabetes mellitus, type 2 ([place]) 08/30/2014  . Diverticulosis of colon without hemorrhage 08/30/2014  . Chronic low back pain 08/30/2014  . Chronic pain syndrome 08/30/2014  . Asthma in adult without complication 34/91/7915  . 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 Osteoarthritis of left knee 02/23/2012    Hilda Blades, PT, DPT, LAT, ATC 02/15/20  2:44 PM Phone: [phone] Fax: [facility] [address] [place], [place], [address] Phone: [phone]   Fax:  [phone]  Name: Tabitha Oneal MRN: [id number] Date of Birth: 22-Apr-1956

## 2020-02-15 NOTE — Patient Instructions (Signed)
Access Code: [email or web address] URL: [email or web address] Date: 02/15/2020 Prepared by: Hilda Blades  Exercises Active Straight Leg Raise with Quad Set - 1 x daily - 3 x weekly - 3 sets - 10 reps Seated Ankle Inversion with Resistance and Legs Crossed - 1 x daily - 3 x weekly - 3 sets - 20 reps Seated Toe Towel Scrunches - 1 x daily - 3 x weekly - 3 sets - 20 reps Seated Plantar Fascia Mobilization with Small Ball - 1 x daily - 7 x weekly - 2-3 minutes hold Seated Flexion Stretch with Swiss Ball - 2 x daily - 7 x weekly - 10 reps - 5 seconds hold Seated Trunk Rotation - Arms Crossed - 2 x daily - 7 x weekly - 10 reps - 5 seconds hold Standing Hip Abduction with Resistance at Ankles and Counter Support - 1 x daily - 3 x weekly - 3 sets - 10 reps Standing Hip Extension with Resistance at Ankles and Counter Support - 1 x daily - 3 x weekly - 3 sets - 10 reps Heel rises with counter support - 1 x daily - 3 x weekly - 3 sets - 10 reps Standing Gastroc Stretch at Counter - 1 x daily - 7 x weekly - 3 reps - 30 seconds hold Standing Toe Dorsiflexion Stretch - 1 x daily - 7 x weekly - 2 reps - 30 seconds hold Standing Ankle Dorsiflexor Toe Extensor Stretch - 1 x daily - 7 x weekly - 2 reps - 30 seconds hold Standing Tandem Balance with Counter Support - 1 x daily - 7 x weekly - 3 reps - 30 seconds hold

## 2020-02-15 NOTE — Progress Notes (Signed)
Careteam: Patient Care Team: [name], NP as PCP - General (Geriatric Medicine) [name], MD as PCP - Cardiology (Cardiology) [name], MD as Consulting Physician (Neurology) [name], MD as Consulting Physician (Ophthalmology)  PLACE OF SERVICE:  [facility] Directive information    No Known Allergies  Chief Complaint  Patient presents with   Establish Care    Re-establish care, patient went to another provider for 9 months.    Urinary Frequency    Patient c/o urinary frequency x 1 week   Medication Management    Patient dose not have medications bottles present    Immunizations    Refused flu vaccine. Discuss need for PNA and TD/Tdap    Quality Metric Gaps    Disucss need for A1c and Dexa. Foot exam due today.   Best Practice Recommendations    Discuss need for HIV screen      HPI: Patient is a 64 y.o. female to re-established care.  Reports she has had medication some medication changes but did not bring medication bottles in.  She has had a lot of changes in dosing in her topamax for headache.   OA- knee replacement in 2013 and worsening pain today, wearing knee brace. Taking mobic 15 mg daily.  And going to PT  htn- controlled on norvasc  Hyperlipidemia- has eaten this morning, taking crestor, no recent fasting labs.  Anemia- on iron.  GERD- continues on omeprazole, stable.   appt scheduled for bone denisty and mammogram  Asthma- stable with breo and singulair   Having increase in frequency and urgency but then will only urinate a small amount.   Review of Systems:  Review of Systems  Constitutional: Negative.  Negative for chills and weight loss.  HENT: Negative for tinnitus.   Respiratory: Negative for cough, sputum production and shortness of breath.   Cardiovascular: Negative for chest pain, palpitations and leg swelling.  Gastrointestinal: Negative for abdominal pain, constipation, diarrhea  and heartburn.  Genitourinary: Positive for frequency and urgency. Negative for dysuria.  Musculoskeletal: Positive for back pain and myalgias. Negative for falls and joint pain.  Skin: Negative.   Neurological: Negative for dizziness and headaches.  Psychiatric/Behavioral: Negative for depression and memory loss. The patient does not have insomnia.     Past Medical History:  Diagnosis Date   Asthma    QVAR daily and Albuterol as needed   Bladder infection    taking Keflex daily    Chronic back pain    reason unknown   Diabetes mellitus    takes Metformin and Tradjenta daily   GERD (gastroesophageal reflux disease)    takes Omeprazole daily   Hard of hearing    Headache    daily.Takes Excedrine daily   Hepatitis C    History of blood transfusion    no abnormal reaction noted   Hyperlipidemia    takes Atorvastatin daily   Hypertension    takes Quinapril daily   Joint pain    Joint swelling    Nocturia    Obesity    Osteoarthritis of left knee 02/23/2012   Seasonal allergies    takes Claritin and Singulair daily.Nasal spray as needed   Shortness of breath dyspnea    with exertion   Past Surgical History:  Procedure Laterality Date   ABDOMINAL HYSTERECTOMY     EYE SURGERY  2021   INJECTION KNEE  04/09/2012   Procedure: KNEE INJECTION;  Surgeon: Johnny Bridge, MD;  Location: [facility];  Service: Orthopedics;  Laterality: Left;   JOINT REPLACEMENT     acl   bil knees   KNEE ARTHROTOMY Left 08/28/2015   Procedure: LEFT KNEE ARTHROFIBROSIS EXCISION; pylectomy;  Surgeon: Meredith Pel, MD;  Location: [facility];  Service: Orthopedics;  Laterality: Left;   KNEE CLOSED REDUCTION  04/09/2012   Procedure: CLOSED MANIPULATION KNEE;  Surgeon: Johnny Bridge, MD;  Location: [facility];  Service: Orthopedics;  Laterality: Left;  Manipulation Knee with Anesthesia includes Application of Traction    TOTAL KNEE ARTHROPLASTY   02/23/2012   Procedure: TOTAL KNEE ARTHROPLASTY;  Surgeon: Johnny Bridge, MD;  Location: [facility];  Service: Orthopedics;  Laterality: Left;   Social History:   reports that she has never smoked. She has never used smokeless tobacco. She reports that she does not drink alcohol and does not use drugs.  Family History  Problem Relation Age of Onset   Bronchitis Father 57   Alzheimer's disease Mother    Ovarian cancer Mother    Stomach cancer Mother    High blood pressure Sister    High blood pressure Sister    Headache Sister        pt thinks    High blood pressure Sister    High blood pressure Sister        pt thinks   Breast cancer Sister    High blood pressure Sister    High blood pressure Sister    Headache Daughter    Diabetes Daughter    Diabetes Daughter    Diabetes Other    Colon cancer Neg Hx    Esophageal cancer Neg Hx    Rectal cancer Neg Hx     Medications: Patient's Medications  New Prescriptions   No medications on file  Previous Medications   ALBUTEROL SULFATE (PROAIR RESPICLICK) 710 (90 BASE) MCG/ACT AEPB    Inhale 4 puffs into the lungs every 4 (four) hours as needed.   AMLODIPINE (NORVASC) 5 MG TABLET    Take 10 mg by mouth daily.   AZELASTINE (ASTELIN) 0.1 % NASAL SPRAY    USE 1 SPRAY EACH NOSTRIL TWICE DAILY   BREO ELLIPTA 200-25 MCG/INH AEPB    INHALE 1 PUFF BY MOUTH EVERY DAY   DICLOFENAC SODIUM (VOLTAREN) 1 % GEL    Apply 8 g topically 2 (two) times daily as needed. On knees   FERROUS SULFATE 325 (65 FE) MG TABLET    TAKE 1 TABLET BY MOUTH EVERY DAY WITH BREAKFAST   FLUTICASONE (FLONASE) 50 MCG/ACT NASAL SPRAY    SPRAY 2 SPRAYS INTO EACH NOSTRIL EVERY DAY   GABAPENTIN (NEURONTIN) 100 MG CAPSULE    Take 100 mg by mouth 3 (three) times daily.   IPRATROPIUM (ATROVENT) 0.03 % NASAL SPRAY    Place 2 sprays into both nostrils 2 (two) times daily.   MELOXICAM (MOBIC) 15 MG TABLET    TAKE 1 TABLET BY MOUTH EVERY DAY   METFORMIN (GLUCOPHAGE)  500 MG TABLET    Take 1 tablet (500 mg total) by mouth 2 (two) times daily with a meal. APPOINTMENT OVERDUE   MONTELUKAST (SINGULAIR) 10 MG TABLET    TAKE 1 TABLET BY MOUTH EVERYDAY AT BEDTIME   OMEPRAZOLE (PRILOSEC) 40 MG CAPSULE    TAKE 1 CAPSULE BY MOUTH EVERY DAY   ONE TOUCH ULTRA TEST TEST STRIP    Check blood sugar once daily as directed   QUINAPRIL (ACCUPRIL) 40  MG TABLET    Take 1 tablet (40 mg total) by mouth at bedtime.   ROSUVASTATIN (CRESTOR) 5 MG TABLET    TAKE 1 TABLET BY MOUTH EVERY DAY   TIZANIDINE (ZANAFLEX) 4 MG TABLET    Take 1 tablet (4 mg total) by mouth every 6 (six) hours as needed for muscle spasms. For headaches.   TOPIRAMATE (TOPAMAX) 100 MG TABLET    Take 100 mg by mouth at bedtime.  Modified Medications   No medications on file  Discontinued Medications   PREDNISONE (STERAPRED UNI-PAK 21 TAB) 10 MG (21) TBPK TABLET    TAKE AS DIRECTED   TOPIRAMATE (TOPAMAX) 50 MG TABLET    Take 50 mg by mouth 2 (two) times daily.    Physical Exam:  Vitals:   02/15/20 0943  BP: 138/70  Pulse: 81  Temp: (!) 96.6 F (35.9 C)  TempSrc: Temporal  SpO2: 98%  Weight: 224 lb 9.6 oz (101.9 kg)  Height: 5\' 2"  (1.575 m)   Body mass index is 41.08 kg/m. Wt Readings from Last 3 Encounters:  02/15/20 224 lb 9.6 oz (101.9 kg)  11/24/19 199 lb (90.3 kg)  08/24/19 212 lb (96.2 kg)    Physical Exam Constitutional:      General: She is not in acute distress.    Appearance: She is well-developed. She is not diaphoretic.  HENT:     Head: Normocephalic and atraumatic.     Mouth/Throat:     Pharynx: No oropharyngeal exudate.  Eyes:     Conjunctiva/sclera: Conjunctivae normal.     Pupils: Pupils are equal, round, and reactive to light.  Cardiovascular:     Rate and Rhythm: Normal rate and regular rhythm.     Pulses:          Dorsalis pedis pulses are 2+ on the right side and 2+ on the left side.       Posterior tibial pulses are 2+ on the right side and 2+ on the left side.      Heart sounds: Normal heart sounds.  Pulmonary:     Effort: Pulmonary effort is normal.     Breath sounds: Normal breath sounds.  Abdominal:     General: Bowel sounds are normal.     Palpations: Abdomen is soft.  Musculoskeletal:        General: No tenderness.     Cervical back: Normal range of motion and neck supple.     Comments: Knee brace to left knee  Feet:     Right foot:     Protective Sensation: 4 sites tested. 4 sites sensed.     Skin integrity: Skin integrity normal.     Toenail Condition: Fungal disease present.    Left foot:     Protective Sensation: 4 sites tested. 4 sites sensed.     Skin integrity: Skin integrity normal.     Toenail Condition: Fungal disease present. Skin:    General: Skin is warm and dry.  Neurological:     Mental Status: She is alert and oriented to person, place, and time.     Labs reviewed: Basic Metabolic Panel: Recent Labs    02/23/19 1152 03/30/19 1119 05/18/19 1026  NA 140 141 139  K 3.8 3.7 4.1  CL 106 108 106  CO2 26 23 24   GLUCOSE 113* 116* 147*  BUN 6* 10 13  CREATININE 0.91 0.75 0.99  CALCIUM 10.0 10.1 10.1   Liver Function Tests: Recent Labs    02/23/19 1152  03/30/19 1119 05/18/19 1026  AST 76* 35 28  ALT 111* 28 21  BILITOT 2.8* 0.8 0.5  PROT 7.3 7.8 8.5*   No results for input(s): LIPASE, AMYLASE in the last 8760 hours. No results for input(s): AMMONIA in the last 8760 hours. CBC: Recent Labs    02/23/19 1152 03/30/19 1119  WBC 6.1 5.9  NEUTROABS 3,678 3,339  HGB 10.2* 11.5*  HCT 32.5* 35.5  MCV 81.5 83.3  PLT 275 298   Lipid Panel: Recent Labs    03/30/19 1119 05/18/19 1026  CHOL 255* 204*  HDL 50 73  LDLCALC 183* 116*  TRIG 98 60  CHOLHDL 5.1* 2.8   TSH: No results for input(s): TSH in the last 8760 hours. A1C: Lab Results  Component Value Date   HGBA1C 6.8 (H) 05/18/2019     Assessment/Plan 1. Need for influenza vaccination - Flu Vaccine QUAD High Dose(Fluad)  2. Essential  hypertension, benign Blood pressure is controlled on amlodipine 5 mg daily  - CBC with Differential/Platelet - COMPLETE METABOLIC PANEL WITH GFR  3. Iron deficiency anemia, unspecified iron deficiency anemia type Will follow up cbc, continues on iron supplement daily  4. Type 2 diabetes mellitus without complication, without long-term current use of insulin (HCC) -does not take blood sugar routinely, no signs of low blood sugars -Encouraged dietary compliance, routine foot care/monitoring and to keep up with diabetic eye exams through ophthalmology   - Hemoglobin A1c  5. Moderate persistent asthma in adult without complication Stable on breo and singulair   6. Primary osteoarthritis of left knee -ongoing, uses mobic daily with knee brace and currently active in PT.   7. Intractable chronic migraine without aura and without status migrainosus Stable at this time, reports there has been some adjustment in topiramate   8. Hyperlipidemia associated with type 2 diabetes mellitus (HCC) Goal LDL <70, continues on crestor 5 mg daily, will follow up labs at this time. - Lipid Panel  9. Urinary frequency - POC Urinalysis Dipstick abnormal, will send for culture at this time - Urine Culture  10. Morbid obesity ([place]) Encouraged weight loss through proper diet and increase in physical activity.     Next appt: 3 months.  Carlos American. Faith, [facility] [phone]

## 2020-02-15 NOTE — Patient Instructions (Addendum)
To sign record release from previous clinic to obtain records over the last 9 months   BRING ALL MEDICATION BOTTLES TO NEXT APPT

## 2020-02-16 ENCOUNTER — Encounter: Payer: Self-pay | Admitting: Sports Medicine

## 2020-02-16 ENCOUNTER — Ambulatory Visit: Payer: Medicare Other | Admitting: Sports Medicine

## 2020-02-16 ENCOUNTER — Telehealth: Payer: Self-pay

## 2020-02-16 DIAGNOSIS — I739 Peripheral vascular disease, unspecified: Secondary | ICD-10-CM

## 2020-02-16 DIAGNOSIS — M25562 Pain in left knee: Secondary | ICD-10-CM | POA: Diagnosis not present

## 2020-02-16 DIAGNOSIS — M779 Enthesopathy, unspecified: Secondary | ICD-10-CM

## 2020-02-16 DIAGNOSIS — M722 Plantar fascial fibromatosis: Secondary | ICD-10-CM | POA: Diagnosis not present

## 2020-02-16 DIAGNOSIS — E119 Type 2 diabetes mellitus without complications: Secondary | ICD-10-CM

## 2020-02-16 DIAGNOSIS — M2550 Pain in unspecified joint: Secondary | ICD-10-CM

## 2020-02-16 DIAGNOSIS — M545 Low back pain, unspecified: Secondary | ICD-10-CM

## 2020-02-16 MED ORDER — ROSUVASTATIN CALCIUM 10 MG PO TABS
10.0000 mg | ORAL_TABLET | Freq: Every day | ORAL | 5 refills | Status: DC
Start: 2020-02-16 — End: 2020-05-14

## 2020-02-16 MED ORDER — METFORMIN HCL 1000 MG PO TABS: ORAL_TABLET | ORAL | 5 refills | Status: DC

## 2020-02-16 NOTE — Progress Notes (Signed)
Subjective: Tabitha Oneal is a 64 y.o. female patient returns to office with complaint of pain all over reports that she has been going to therapy and it seems to be helping a little bit but it also makes her more painful especially when they have her to go up on her toes reports that she has been consistent with blowing and trying to stay strong and positive however her back has been really hurting.  Patient denies any other pedal complaints at this time.  Patient Active Problem List   Diagnosis Date Noted  . Pain in left knee 09/13/2019  . 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 Abnormal EKG 03/02/2019  . Incomplete left bundle branch block (LBBB) 03/02/2019  . Rebound headache 12/13/2018  . Overuse of medication 12/13/2018  . Intractable chronic migraine without aura and without status migrainosus 12/13/2018  . Microcytic anemia 02/24/2018  . High risk medication use 08/06/2017  . Seasonal and perennial allergic rhinitis 07/30/2017  . Recurrent infections 07/30/2017  . Cough 06/08/2017  . Moderate persistent asthma without complication 03/11/5101  . Chronic constipation 02/04/2017  . Hyperlipidemia due to type 2 diabetes mellitus ([place]) 02/04/2017  . Hallux rigidus, left foot 01/01/2017  . Postmenopausal symptoms 02/22/2016  . Arthrofibrosis of knee joint 08/28/2015  . Left hip pain 10/08/2014  . Type 2 diabetes mellitus without complication, without long-term current use of insulin ([place]) 10/08/2014  . Hyperlipidemia LDL goal <100 10/08/2014  . Essential hypertension, benign 08/30/2014  . Diabetes mellitus, type 2 ([place]) 08/30/2014  . Diverticulosis of colon without hemorrhage 08/30/2014  . Chronic low back pain 08/30/2014  . Chronic pain syndrome 08/30/2014  . Asthma in adult without complication 58/52/7782  . 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 Osteoarthritis of left knee 02/23/2012    Current Outpatient  Medications on File Prior to Visit  Medication Sig Dispense Refill  . Albuterol Sulfate (PROAIR RESPICLICK) 423 (90 Base) MCG/ACT AEPB Inhale 4 puffs into the lungs every 4 (four) hours as needed. 1 each 1  . amLODipine (NORVASC) 10 MG tablet Take 10 mg by mouth daily.    Marland Kitchen amLODipine (NORVASC) 5 MG tablet Take 10 mg by mouth daily.    Marland Kitchen azelastine (ASTELIN) 0.1 % nasal spray USE 1 SPRAY EACH NOSTRIL TWICE DAILY 30 mL 5  . BREO ELLIPTA 200-25 MCG/INH AEPB INHALE 1 PUFF BY MOUTH EVERY DAY 60 each 5  . diclofenac sodium (VOLTAREN) 1 % GEL Apply 8 g topically 2 (two) times daily as needed. On knees    . ferrous sulfate 325 (65 FE) MG tablet TAKE 1 TABLET BY MOUTH EVERY DAY WITH BREAKFAST 90 tablet 1  . fluticasone (FLONASE) 50 MCG/ACT nasal spray SPRAY 2 SPRAYS INTO EACH NOSTRIL EVERY DAY 48 mL 1  . gabapentin (NEURONTIN) 100 MG capsule Take 100 mg by mouth 3 (three) times daily.    Marland Kitchen ipratropium (ATROVENT) 0.03 % nasal spray Place 2 sprays into both nostrils 2 (two) times daily. 90 mL 1  . JARDIANCE 10 MG TABS tablet Take 10 mg by mouth every morning.    . meloxicam (MOBIC) 15 MG tablet TAKE 1 TABLET BY MOUTH EVERY DAY 30 tablet 0  . montelukast (SINGULAIR) 10 MG tablet TAKE 1 TABLET BY MOUTH EVERYDAY AT BEDTIME 90 tablet 1  . omeprazole (PRILOSEC) 40 MG capsule TAKE 1 CAPSULE BY MOUTH EVERY DAY 90 capsule 1  . ONE TOUCH ULTRA TEST test strip Check blood sugar  once daily as directed  3  . predniSONE (DELTASONE) 5 MG tablet Take by mouth.    . quinapril (ACCUPRIL) 40 MG tablet Take 1 tablet (40 mg total) by mouth at bedtime. 90 tablet 1  . tiZANidine (ZANAFLEX) 4 MG tablet Take 1 tablet (4 mg total) by mouth every 6 (six) hours as needed for muscle spasms. For headaches. 90 tablet 3  . topiramate (TOPAMAX) 100 MG tablet Take 100 mg by mouth at bedtime.    . topiramate (TOPAMAX) 25 MG tablet Take 25 mg by mouth 2 (two) times daily.     No current facility-administered medications on file prior to  visit.    No Known Allergies  Objective: Physical Exam General: The patient is alert and oriented x3 in no acute distress.  Dermatology: Skin is warm, dry and supple bilateral lower extremities. Nails 1-10 are normal. There is no erythema, no eccymosis, no open lesions present. Integument is otherwise unremarkable.  Taping intact to plantar surface of left foot.  Vascular: Dorsalis Pedis pulse and Posterior Tibial pulse are 1/4 bilateral. Capillary fill time is immediate to all digits.  Neurological: Grossly intact to light touch bilateral.  Musculoskeletal: There is decreased tenderness to palpation at the medial calcaneal tubercale and through the insertion of the plantar fascia left greater than right and achilles on left, patient has plantar fascial taping in place. There is decreased Ankle joint range of motion bilateral worse on left, + pes planus. Guarding due to pain with extension of left leg there is pain at the knee as well as the low back like previous.    Assessment and Plan: Problem List Items Addressed This Visit      Other   Pain in left knee    Other Visit Diagnoses    Plantar fasciitis of left foot    -  Primary   Tendonitis       Arthralgia, unspecified joint       Low back pain at multiple sites       Relevant Medications   predniSONE (DELTASONE) 5 MG tablet   PVD (peripheral vascular disease) ([place])       Relevant Medications   amLODipine (NORVASC) 10 MG tablet   Diabetes mellitus without complication (HCC)       Relevant Medications   JARDIANCE 10 MG TABS tablet      -Complete examination performed.  -Re-discussed with pain in the setting of chronic left knee pain as well as low back pain -Patient to continue with physical therapy until completed  -Encouraged patient to follow-up with orthopedic doctor regarding the knee and and low back  -Continue with good supportive shoes daily for foot type -Return to office as needed  Landis Martins, DPM

## 2020-02-16 NOTE — Telephone Encounter (Signed)
Discussed additional response with patient.

## 2020-02-16 NOTE — Telephone Encounter (Signed)
MCV and MCH is the color and shape of the red blood cells.  Will get labs at next office visit

## 2020-02-16 NOTE — Telephone Encounter (Signed)
Discussed results with patient, patient verbalized understanding of results  Patient would like an explanation about MCV and MCH, please advise   Add on test given to TT ([facility] lab tech)  Patient states it is best for her to have lab rechecked on the same day as her 3 month follow-up due to transportation. Notation made on pending 3 month follow-up visit to come fasting for labs.  Patient aware of medication changes and aware I will send updated rx's to the pharmacy.  I had a verbal conversation with Janett Billow to clarify instructions for increasing metformin. Per verbal conversation Janett Billow clarified that patient should take:   Metformin 1000 mg in the morning and 500 mg in the pm x 1 week, then increase to 1000 mg twice daily. Rx sent to reflect this information.

## 2020-02-16 NOTE — Telephone Encounter (Signed)
-----   Message from [name], NP sent at 02/16/2020  8:20 AM EDT ----- Hgb stable, but MCV and MCH down, can we add on TIBC, ferritin and iron level please (iron def anemia, Dx) A1c is up- please verify that she is taking her metformin 500 mg by mouth twice daily, if she is we will need to increase to 1000 mg by mouth twice daily To increase to 1000 mg daily in the morning first and continue this for 1 week. Then increase to 1000 mg twice daily  Cholesterol has improved from previous but stil elevated- lets increase crestor to 10 mg daily She also needs to continue to work on diet modification. Low fat diabetic diet.  To keep follow up in 3 months but would like her to have labs PRIOR to appt- make sure that labs are 3 months out to avoid conflict with insurance

## 2020-02-17 LAB — LIPID PANEL
Cholesterol: 173 mg/dL (ref <200)
HDL: 62 mg/dL (ref >=50)
LDL Cholesterol (Calc): 92 mg/dL (calc)
Non-HDL Cholesterol (Calc): 111 mg/dL (calc) (ref <130)
Total CHOL/HDL Ratio: 2.8 (calc) (ref <5.0)
Triglycerides: 98 mg/dL (ref <150)

## 2020-02-17 LAB — CBC WITH DIFFERENTIAL/PLATELET
Absolute Monocytes: 489 cells/uL (ref 200–950)
Basophils Absolute: 75 cells/uL (ref 0–200)
Basophils Relative: 0.8 %
Eosinophils Absolute: 404 cells/uL (ref 15–500)
Eosinophils Relative: 4.3 %
HCT: 35.2 % (ref 35.0–45.0)
Hemoglobin: 11.5 g/dL — ABNORMAL LOW (ref 11.7–15.5)
Lymphs Abs: 2538 cells/uL (ref 850–3900)
MCH: 25.9 pg — ABNORMAL LOW (ref 27.0–33.0)
MCHC: 32.7 g/dL (ref 32.0–36.0)
MCV: 79.3 fL — ABNORMAL LOW (ref 80.0–100.0)
MPV: 12 fL (ref 7.5–12.5)
Monocytes Relative: 5.2 %
Neutro Abs: 5894 cells/uL (ref 1500–7800)
Neutrophils Relative %: 62.7 %
Platelets: 298 10*3/uL (ref 140–400)
RBC: 4.44 10*6/uL (ref 3.80–5.10)
RDW: 13.4 % (ref 11.0–15.0)
Total Lymphocyte: 27 %
WBC: 9.4 10*3/uL (ref 3.8–10.8)

## 2020-02-17 LAB — COMPLETE METABOLIC PANEL WITH GFR
AG Ratio: 1.4 (calc) (ref 1.0–2.5)
ALT: 12 U/L (ref 6–29)
AST: 15 U/L (ref 10–35)
Albumin: 4.7 g/dL (ref 3.6–5.1)
Alkaline phosphatase (APISO): 185 U/L — ABNORMAL HIGH (ref 37–153)
BUN: 11 mg/dL (ref 7–25)
CO2: 25 mmol/L (ref 20–32)
Calcium: 10.1 mg/dL (ref 8.6–10.4)
Chloride: 106 mmol/L (ref 98–110)
Creat: 0.89 mg/dL (ref 0.50–0.99)
GFR, Est African American: 79 mL/min/{1.73_m2} (ref >=60)
GFR, Est Non African American: 68 mL/min/{1.73_m2} (ref >=60)
Globulin: 3.3 g/dL (calc) (ref 1.9–3.7)
Glucose, Bld: 153 mg/dL — ABNORMAL HIGH (ref 65–99)
Potassium: 4.2 mmol/L (ref 3.5–5.3)
Sodium: 140 mmol/L (ref 135–146)
Total Bilirubin: 0.3 mg/dL (ref 0.2–1.2)
Total Protein: 8 g/dL (ref 6.1–8.1)

## 2020-02-17 LAB — TEST AUTHORIZATION

## 2020-02-17 LAB — IRON,TIBC AND FERRITIN PANEL
%SAT: 28 % (calc) (ref 16–45)
Ferritin: 106 ng/mL (ref 16–288)
Iron: 91 ug/dL (ref 45–160)
TIBC: 323 mcg/dL (calc) (ref 250–450)

## 2020-02-17 LAB — URINE CULTURE
MICRO NUMBER:: 10986581
SPECIMEN QUALITY:: ADEQUATE

## 2020-02-17 LAB — HEMOGLOBIN A1C
Hgb A1c MFr Bld: 8.4 % of total Hgb — ABNORMAL HIGH (ref <5.7)
Mean Plasma Glucose: 194 (calc)
eAG (mmol/L): 10.8 (calc)

## 2020-02-20 ENCOUNTER — Other Ambulatory Visit: Payer: Self-pay | Admitting: Nurse Practitioner

## 2020-02-20 MED ORDER — AMOXICILLIN-POT CLAVULANATE 875-125 MG PO TABS: 1.0000 | ORAL_TABLET | Freq: Two times a day (BID) | ORAL | 0 refills | Status: DC

## 2020-02-24 ENCOUNTER — Telehealth: Payer: Self-pay

## 2020-02-24 ENCOUNTER — Ambulatory Visit: Payer: Medicare Other | Attending: Sports Medicine | Admitting: Physical Therapy

## 2020-02-24 ENCOUNTER — Encounter: Payer: Self-pay | Admitting: Nurse Practitioner

## 2020-02-24 ENCOUNTER — Other Ambulatory Visit: Payer: Self-pay

## 2020-02-24 ENCOUNTER — Ambulatory Visit (INDEPENDENT_AMBULATORY_CARE_PROVIDER_SITE_OTHER): Payer: Medicare Other | Admitting: Nurse Practitioner

## 2020-02-24 ENCOUNTER — Encounter: Payer: Self-pay | Admitting: Physical Therapy

## 2020-02-24 DIAGNOSIS — M79672 Pain in left foot: Secondary | ICD-10-CM | POA: Diagnosis not present

## 2020-02-24 DIAGNOSIS — M545 Low back pain, unspecified: Secondary | ICD-10-CM | POA: Insufficient documentation

## 2020-02-24 DIAGNOSIS — Z0000 Encounter for general adult medical examination without abnormal findings: Secondary | ICD-10-CM

## 2020-02-24 DIAGNOSIS — M25562 Pain in left knee: Secondary | ICD-10-CM | POA: Insufficient documentation

## 2020-02-24 DIAGNOSIS — R2689 Other abnormalities of gait and mobility: Secondary | ICD-10-CM | POA: Insufficient documentation

## 2020-02-24 DIAGNOSIS — M25662 Stiffness of left knee, not elsewhere classified: Secondary | ICD-10-CM | POA: Diagnosis not present

## 2020-02-24 DIAGNOSIS — G8929 Other chronic pain: Secondary | ICD-10-CM | POA: Insufficient documentation

## 2020-02-24 DIAGNOSIS — M6281 Muscle weakness (generalized): Secondary | ICD-10-CM | POA: Insufficient documentation

## 2020-02-24 NOTE — Patient Instructions (Addendum)
Tabitha Oneal , Thank you for taking time to come for your Medicare Wellness Visit. I appreciate your ongoing commitment to your health goals. Please review the following plan we discussed and let me know if I can assist you in the future.   Screening recommendations/referrals: Colonoscopy up to date Mammogram-up to date Bone Density scheduled appt  Recommended yearly ophthalmology/optometry visit for glaucoma screening and checkup Recommended yearly dental visit for hygiene and checkup  Vaccinations: Influenza vaccine up to date Pneumococcal vaccine DUE for pnevnar-13 (can get at pharmacy or in office)  Tdap vaccine DUE- to get at local pharmacy Shingles vaccine DUE- to get at pharmacy    Advanced directives: recommend to complete and bring to next follow up appt  Conditions/risks identified: obesity, weight loss encouraged, cardiovascular disease, advance age  Next appointment: 1 year.    Preventive Care 65 Years and Older, Female Preventive care refers to lifestyle choices and visits with your health care provider that can promote health and wellness. What does preventive care include?  A yearly physical exam. This is also called an annual well check.  Dental exams once or twice a year.  Routine eye exams. Ask your health care provider how often you should have your eyes checked.  Personal lifestyle choices, including:  Daily care of your teeth and gums.  Regular physical activity.  Eating a healthy diet.  Avoiding tobacco and drug use.  Limiting alcohol use.  Practicing safe sex.  Taking low-dose aspirin every day.  Taking vitamin and mineral supplements as recommended by your health care provider. What happens during an annual well check? The services and screenings done by your health care provider during your annual well check will depend on your age, overall health, lifestyle risk factors, and family history of disease. Counseling  Your health care provider  may ask you questions about your:  Alcohol use.  Tobacco use.  Drug use.  Emotional well-being.  Home and relationship well-being.  Sexual activity.  Eating habits.  History of falls.  Memory and ability to understand (cognition).  Work and work Statistician.  Reproductive health. Screening  You may have the following tests or measurements:  Height, weight, and BMI.  Blood pressure.  Lipid and cholesterol levels. These may be checked every 5 years, or more frequently if you are over 80 years old.  Skin check.  Lung cancer screening. You may have this screening every year starting at age 21 if you have a 30-pack-year history of smoking and currently smoke or have quit within the past 15 years.  Fecal occult blood test (FOBT) of the stool. You may have this test every year starting at age 55.  Flexible sigmoidoscopy or colonoscopy. You may have a sigmoidoscopy every 5 years or a colonoscopy every 10 years starting at age 61.  Hepatitis C blood test.  Hepatitis B blood test.  Sexually transmitted disease (STD) testing.  Diabetes screening. This is done by checking your blood sugar (glucose) after you have not eaten for a while (fasting). You may have this done every 1-3 years.  Bone density scan. This is done to screen for osteoporosis. You may have this done starting at age 30.  Mammogram. This may be done every 1-2 years. Talk to your health care provider about how often you should have regular mammograms. Talk with your health care provider about your test results, treatment options, and if necessary, the need for more tests. Vaccines  Your health care provider may recommend certain vaccines, such  as:  Influenza vaccine. This is recommended every year.  Tetanus, diphtheria, and acellular pertussis (Tdap, Td) vaccine. You may need a Td booster every 10 years.  Zoster vaccine. You may need this after age 58.  Pneumococcal 13-valent conjugate (PCV13) vaccine.  One dose is recommended after age 48.  Pneumococcal polysaccharide (PPSV23) vaccine. One dose is recommended after age 52. Talk to your health care provider about which screenings and vaccines you need and how often you need them. This information is not intended to replace advice given to you by your health care provider. Make sure you discuss any questions you have with your health care provider. Document Released: 06/08/2015 Document Revised: 01/30/2016 Document Reviewed: 03/13/2015 Elsevier Interactive Patient Education  2017 [place] Prevention in the Home Falls can cause injuries. They can happen to people of all ages. There are many things you can do to make your home safe and to help prevent falls. What can I do on the outside of my home?  Regularly fix the edges of walkways and driveways and fix any cracks.  Remove anything that might make you trip as you walk through a door, such as a raised step or threshold.  Trim any bushes or trees on the path to your home.  Use bright outdoor lighting.  Clear any walking paths of anything that might make someone trip, such as rocks or tools.  Regularly check to see if handrails are loose or broken. Make sure that both sides of any steps have handrails.  Any raised decks and porches should have guardrails on the edges.  Have any leaves, snow, or ice cleared regularly.  Use sand or salt on walking paths during winter.  Clean up any spills in your garage right away. This includes oil or grease spills. What can I do in the bathroom?  Use night lights.  Install grab bars by the toilet and in the tub and shower. Do not use towel bars as grab bars.  Use non-skid mats or decals in the tub or shower.  If you need to sit down in the shower, use a plastic, non-slip stool.  Keep the floor dry. Clean up any water that spills on the floor as soon as it happens.  Remove soap buildup in the tub or shower regularly.  Attach bath  mats securely with double-sided non-slip rug tape.  Do not have throw rugs and other things on the floor that can make you trip. What can I do in the bedroom?  Use night lights.  Make sure that you have a light by your bed that is easy to reach.  Do not use any sheets or blankets that are too big for your bed. They should not hang down onto the floor.  Have a firm chair that has side arms. You can use this for support while you get dressed.  Do not have throw rugs and other things on the floor that can make you trip. What can I do in the kitchen?  Clean up any spills right away.  Avoid walking on wet floors.  Keep items that you use a lot in easy-to-reach places.  If you need to reach something above you, use a strong step stool that has a grab bar.  Keep electrical cords out of the way.  Do not use floor polish or wax that makes floors slippery. If you must use wax, use non-skid floor wax.  Do not have throw rugs and other things on the  floor that can make you trip. What can I do with my stairs?  Do not leave any items on the stairs.  Make sure that there are handrails on both sides of the stairs and use them. Fix handrails that are broken or loose. Make sure that handrails are as long as the stairways.  Check any carpeting to make sure that it is firmly attached to the stairs. Fix any carpet that is loose or worn.  Avoid having throw rugs at the top or bottom of the stairs. If you do have throw rugs, attach them to the floor with carpet tape.  Make sure that you have a light switch at the top of the stairs and the bottom of the stairs. If you do not have them, ask someone to add them for you. What else can I do to help prevent falls?  Wear shoes that:  Do not have high heels.  Have rubber bottoms.  Are comfortable and fit you well.  Are closed at the toe. Do not wear sandals.  If you use a stepladder:  Make sure that it is fully opened. Do not climb a closed  stepladder.  Make sure that both sides of the stepladder are locked into place.  Ask someone to hold it for you, if possible.  Clearly mark and make sure that you can see:  Any grab bars or handrails.  First and last steps.  Where the edge of each step is.  Use tools that help you move around (mobility aids) if they are needed. These include:  Canes.  Walkers.  Scooters.  Crutches.  Turn on the lights when you go into a dark area. Replace any light bulbs as soon as they burn out.  Set up your furniture so you have a clear path. Avoid moving your furniture around.  If any of your floors are uneven, fix them.  If there are any pets around you, be aware of where they are.  Review your medicines with your doctor. Some medicines can make you feel dizzy. This can increase your chance of falling. Ask your doctor what other things that you can do to help prevent falls. This information is not intended to replace advice given to you by your health care provider. Make sure you discuss any questions you have with your health care provider. Document Released: 03/08/2009 Document Revised: 10/18/2015 Document Reviewed: 06/16/2014 Elsevier Interactive Patient Education  2017 [employer].

## 2020-02-24 NOTE — Therapy (Signed)
[place], [place], [address] Phone: [phone]   Fax:  [phone]  Physical Therapy Treatment   Progress Note Reporting Period 01/16/2020 to 02/24/2020  See note below for Objective Data and Assessment of Progress/Goals.    Patient Details  Name: Tabitha Oneal MRN: [id number] Date of Birth: 07/27/1955 Referring Provider (PT): Landis Martins, [place]   Encounter Date: 02/24/2020   PT End of Session - 02/24/20 1108    Visit Number 10    Number of Visits 16    Date for PT Re-Evaluation 03/12/20    Authorization Type UHC MCR    Progress Note Due on Visit 20    PT Start Time 1045    PT Stop Time 1125    PT Time Calculation (min) 40 min    Activity Tolerance Patient tolerated treatment well    Behavior During Therapy [facility] for tasks assessed/performed           Past Medical History:  Diagnosis Date  . Asthma    QVAR daily and Albuterol as needed  . Bladder infection    taking Keflex daily   . Chronic back pain    reason unknown  . Diabetes mellitus    takes Metformin and Tradjenta daily  . GERD (gastroesophageal reflux disease)    takes Omeprazole daily  . Hard of hearing   . Headache    daily.Takes Excedrine daily  . Hepatitis C   . History of blood transfusion    no abnormal reaction noted  . Hyperlipidemia    takes Atorvastatin daily  . Hypertension    takes Quinapril daily  . Joint pain   . Joint swelling   . Nocturia   . Obesity   . Osteoarthritis of left knee 02/23/2012  . Seasonal allergies    takes Claritin and Singulair daily.Nasal spray as needed  . Shortness of breath dyspnea    with exertion    Past Surgical History:  Procedure Laterality Date  . ABDOMINAL HYSTERECTOMY    . EYE SURGERY  2021  . INJECTION KNEE  04/09/2012   Procedure: KNEE INJECTION;  Surgeon: Johnny Bridge, MD;  Location: [place];  Service: Orthopedics;  Laterality: Left;    . JOINT REPLACEMENT     acl   bil knees  . KNEE ARTHROTOMY Left 08/28/2015   Procedure: LEFT KNEE ARTHROFIBROSIS EXCISION; pylectomy;  Surgeon: Meredith Pel, MD;  Location: [place];  Service: Orthopedics;  Laterality: Left;  . KNEE CLOSED REDUCTION  04/09/2012   Procedure: CLOSED MANIPULATION KNEE;  Surgeon: Johnny Bridge, MD;  Location: [place];  Service: Orthopedics;  Laterality: Left;  Manipulation Knee with Anesthesia includes Application of Traction   . TOTAL KNEE ARTHROPLASTY  02/23/2012   Procedure: TOTAL KNEE ARTHROPLASTY;  Surgeon: Johnny Bridge, MD;  Location: [place];  Service: Orthopedics;  Laterality: Left;    There were no vitals filed for this visit.   Subjective Assessment - 02/24/20 1047    Subjective Patient reports she is doing well, she saw her foot doctor and was told to keep doing what she was doing.    Patient Stated Goals Get rid of pain so she can move better    Currently in Pain? Yes    Pain Score 6     Pain Location Foot    Pain Orientation Left    Pain Descriptors / Indicators Aching    Pain Type Chronic pain  Pain Onset More than a month ago    Pain Frequency Constant              [facility] PT Assessment - 02/24/20 0001      Assessment   Medical Diagnosis Plantar fasciitis of left foot, Left knee pain, Low back pain     Referring Provider (PT) Landis Martins, DPM      Observation/Other Assessments   Focus on Therapeutic Outcomes (FOTO)  FOTO discharged due to wrong set-up      AROM   Left Knee Extension -10    Left Knee Flexion 25    Right Ankle Dorsiflexion 7    Left Ankle Dorsiflexion 5      Strength   Left Ankle Dorsiflexion 5/5    Left Ankle Plantar Flexion 4/5    Left Ankle Inversion 4+/5    Left Ankle Eversion 5/5                         [facility] Adult PT Treatment/Exercise - 02/24/20 0001      Neuro Re-ed    Neuro Re-ed Details  Tandem stance 4 x 30 sec each   minimal use of UE for support      Exercises   Exercises Knee/Hip;Ankle      Knee/Hip Exercises: Standing   Hip Abduction 2 sets;10 reps    Abduction Limitations green around knees    Hip Extension 2 sets;10 reps    Extension Limitations green around knees      Manual Therapy   Manual Therapy Taping    Manual therapy comments Arch tape low dye using Leuko tape for improve support and duration - patient reports immediate improvement upon weight bearing      Ankle Exercises: Stretches   Slant Board Stretch 3 reps;30 seconds      Ankle Exercises: Standing   Heel Raises 15 reps   3 sets   Heel Raises Limitations ball squeeze between heels, on edge of step      Ankle Exercises: Supine   T-Band Inversion with red 2 x 15                  PT Education - 02/24/20 1105    Education Details HEP, arch support insoles    Person(s) Educated Patient    Methods Explanation;Handout    Comprehension Verbalized understanding;Need further instruction            PT Short Term Goals - 02/24/20 1111      PT SHORT TERM GOAL #1   Title Patient will be I with initial HEP to progress with PT    Time 4    Period Weeks    Status On-going    Target Date 02/13/20      PT SHORT TERM GOAL #2   Title Patient will exhibit >/= 5 deg left ankle dorsiflexion to improve gait    Baseline 5 deg    Time 4    Period Weeks    Status Achieved    Target Date 02/13/20      PT SHORT TERM GOAL #3   Title Patient will be able to ambulation household distances without CAM boot and </= 5/10 pain to improve mobility    Baseline Patient reports 5/10 pain, 2-3/10 with arch taping    Time 4    Period Weeks    Status Achieved    Target Date 02/13/20  PT Long Term Goals - 01/16/20 1106      PT LONG TERM GOAL #1   Title Patient will be I with final HEP to maintain progress from PT    Time 8    Period Weeks    Status New    Target Date 03/12/20      PT LONG TERM GOAL #2   Title Patient will exhibit >/= 45 deg knee  flexion to improve transfer ability    Time 8    Period Weeks    Status New    Target Date 03/12/20      PT LONG TERM GOAL #3   Title Patient will exhibit left ankle strength grossly 5/5 MMT to improve walking tolerance and arch control    Time 8    Period Weeks    Status New    Target Date 03/12/20      PT LONG TERM GOAL #4   Title Patient will be able to ambulate community level distance with LRAD and </= 5/10 pain level to improve grocery shopping    Time 8    Period Weeks    Status New    Target Date 03/12/20      PT LONG TERM GOAL #5   Title Patient will report improve functional level of </= 42% limitation of FOTO    Time 8    Period Weeks    Status New    Target Date 03/12/20                 Plan - 02/24/20 1109    Clinical Impression Statement Patient tolerated therapy well with no adverse effects. Provided arch support tape and continued focus of therapy on stretching and strengthening for left foot/ankle. Patient provided with handout for arch support insoles she can purchase. She continues to tolerate therapy well and seems to be improving with motion and strength of ankle/foot, bunion on left continues to bother her some. She is not improving in regard to her knee or back at this time, and is unlikely to improve regarding knee motion. She would benefit from continued skilled PT to progress her strength and motion in order to improve her walking and maximize functional mobility.    PT Treatment/Interventions ADLs/Self Care Home Management;Aquatic Therapy;Cryotherapy;Electrical Stimulation;Iontophoresis 4mg /ml Dexamethasone;Moist Heat;Neuromuscular re-education;Balance training;Therapeutic exercise;Therapeutic activities;Functional mobility training;Stair training;Gait training;Patient/family education;Manual techniques;Dry needling;Passive range of motion;Taping;Joint Manipulations    PT Next Visit Plan Assess HEP and progress PRN, progress left calf flexibility and  ankle strength, manual and stretching for left knee range of motion as able    PT Home Exercise Plan [id number]: seated banded inversion with yellow, seated towel scrunch, seated plantar fascia mobilization with ball, standing heel raises, SLR, standing hip abduction and extension with green, seated lumbar flexion and rotation stretch, standing calf and great toe stretching, tandem balance at counter    Consulted and Agree with Plan of Care Patient           Patient will benefit from skilled therapeutic intervention in order to improve the following deficits and impairments:  Abnormal gait, Decreased range of motion, Difficulty walking, Pain, Decreased activity tolerance, Decreased balance, Improper body mechanics, Decreased strength, Decreased mobility, Impaired flexibility  Visit Diagnosis: Pain in left foot  Chronic pain of left knee  Stiffness of left knee, not elsewhere classified  Chronic bilateral low back pain, unspecified whether sciatica present  Muscle weakness (generalized)  Other abnormalities of gait and mobility     Problem List Patient  Active Problem List   Diagnosis Date Noted  . Pain in left knee 09/13/2019  . 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 Abnormal EKG 03/02/2019  . Incomplete left bundle branch block (LBBB) 03/02/2019  . Rebound headache 12/13/2018  . Overuse of medication 12/13/2018  . Intractable chronic migraine without aura and without status migrainosus 12/13/2018  . Microcytic anemia 02/24/2018  . High risk medication use 08/06/2017  . Seasonal and perennial allergic rhinitis 07/30/2017  . Recurrent infections 07/30/2017  . Cough 06/08/2017  . Moderate persistent asthma without complication 19/75/8832  . Chronic constipation 02/04/2017  . Hyperlipidemia due to type 2 diabetes mellitus ([place]) 02/04/2017  . Hallux rigidus, left foot 01/01/2017  . Postmenopausal symptoms 02/22/2016  . Arthrofibrosis of knee joint  08/28/2015  . Left hip pain 10/08/2014  . Type 2 diabetes mellitus without complication, without long-term current use of insulin ([place]) 10/08/2014  . Hyperlipidemia LDL goal <100 10/08/2014  . Essential hypertension, benign 08/30/2014  . Diabetes mellitus, type 2 ([place]) 08/30/2014  . Diverticulosis of colon without hemorrhage 08/30/2014  . Chronic low back pain 08/30/2014  . Chronic pain syndrome 08/30/2014  . Asthma in adult without complication 54/98/2641  . 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 Osteoarthritis of left knee 02/23/2012    Hilda Blades, PT, DPT, LAT, ATC 02/24/20  11:27 AM Phone: [phone] Fax: [facility] [address] [place], [place], [address] Phone: [phone]   Fax:  [phone]  Name: Tabitha Oneal MRN: [id number] Date of Birth: November 02, 1955

## 2020-02-24 NOTE — Progress Notes (Signed)
   This service is provided via telemedicine  No vital signs collected/recorded due to the encounter was a telemedicine visit.   Location of patient (ex: home, work):  Home  Patient consents to a telephone visit:  Yes, see telephone encounter dated 02/24/2020 with annual consent   Location of the provider (ex: office, home):  [facility], Office   Name of any referring provider:  N/A  Names of all persons participating in the telemedicine service and their role in the encounter:  S.Chrae B/CMA, Sherrie Mustache, NP, and Patient   Time spent on call: 22 min with medical assistant

## 2020-02-24 NOTE — Progress Notes (Signed)
Subjective:   Tabitha Oneal is a 64 y.o. female who presents for Medicare Annual (Subsequent) preventive examination.  Review of Systems     Cardiac Risk Factors include: obesity (BMI >30kg/m2);advanced age (>40men, >82 women);diabetes mellitus;hypertension;sedentary lifestyle;dyslipidemia     Objective:    Today's Vitals   02/24/20 0954  PainSc: 8    There is no height or weight on file to calculate BMI.  Advanced Directives 02/24/2020 01/16/2020 05/18/2019 02/23/2019 02/08/2019 01/24/2019 09/23/2018  Does Patient Have a Medical Advance Directive? No No No No Yes No No  Type of Advance Directive - - - - Living will - -  Does patient want to make changes to medical advance directive? - - - - - - -  Would patient like information on creating a medical advance directive? Yes (MAU/Ambulatory/Procedural Areas - Information given) No - Patient declined No - Patient declined Yes (MAU/Ambulatory/Procedural Areas - Information given) - Yes (MAU/Ambulatory/Procedural Areas - Information given) Yes (MAU/Ambulatory/Procedural Areas - Information given)  Pre-existing out of facility DNR order (yellow form or pink MOST form) - - - - - - -    Current Medications (verified) Outpatient Encounter Medications as of 02/24/2020  Medication Sig  . Albuterol Sulfate (PROAIR RESPICLICK) 852 (90 Base) MCG/ACT AEPB Inhale 4 puffs into the lungs every 4 (four) hours as needed.  Marland Kitchen amLODipine (NORVASC) 10 MG tablet Take 10 mg by mouth daily.  Marland Kitchen amoxicillin-clavulanate (AUGMENTIN) 875-125 MG tablet Take 1 tablet by mouth 2 (two) times daily.  Marland Kitchen azelastine (ASTELIN) 0.1 % nasal spray USE 1 SPRAY EACH NOSTRIL TWICE DAILY  . BREO ELLIPTA 200-25 MCG/INH AEPB INHALE 1 PUFF BY MOUTH EVERY DAY  . diclofenac sodium (VOLTAREN) 1 % GEL Apply 8 g topically 2 (two) times daily as needed. On knees  . ferrous sulfate 325 (65 FE) MG tablet TAKE 1 TABLET BY MOUTH EVERY DAY WITH BREAKFAST  . fluticasone (FLONASE) 50  MCG/ACT nasal spray SPRAY 2 SPRAYS INTO EACH NOSTRIL EVERY DAY  . gabapentin (NEURONTIN) 100 MG capsule Take 100 mg by mouth 3 (three) times daily.  Marland Kitchen ipratropium (ATROVENT) 0.03 % nasal spray Place 2 sprays into both nostrils 2 (two) times daily.  . metFORMIN (GLUCOPHAGE) 1000 MG tablet Start with 1000 mg in the morning and 500 mg in the evening x 1 week then increase to 1000 mg in the morning and 1000 mg in the evening  . montelukast (SINGULAIR) 10 MG tablet TAKE 1 TABLET BY MOUTH EVERYDAY AT BEDTIME  . omeprazole (PRILOSEC) 40 MG capsule TAKE 1 CAPSULE BY MOUTH EVERY DAY  . ONE TOUCH ULTRA TEST test strip Check blood sugar once daily as directed  . quinapril (ACCUPRIL) 40 MG tablet Take 1 tablet (40 mg total) by mouth at bedtime.  . rosuvastatin (CRESTOR) 10 MG tablet Take 1 tablet (10 mg total) by mouth daily.  Marland Kitchen tiZANidine (ZANAFLEX) 4 MG tablet Take 1 tablet (4 mg total) by mouth every 6 (six) hours as needed for muscle spasms. For headaches.  . topiramate (TOPAMAX) 100 MG tablet Take 100 mg by mouth at bedtime.  Marland Kitchen JARDIANCE 10 MG TABS tablet Take 10 mg by mouth every morning. (Patient not taking: Reported on 02/24/2020)  . [DISCONTINUED] amLODipine (NORVASC) 5 MG tablet Take 10 mg by mouth daily.  . [DISCONTINUED] meloxicam (MOBIC) 15 MG tablet TAKE 1 TABLET BY MOUTH EVERY DAY  . [DISCONTINUED] predniSONE (DELTASONE) 5 MG tablet Take by mouth.  . [DISCONTINUED] topiramate (TOPAMAX) 25 MG tablet Take  25 mg by mouth 2 (two) times daily.   No facility-administered encounter medications on file as of 02/24/2020.    Allergies (verified) Patient has no known allergies.   History: Past Medical History:  Diagnosis Date  . Asthma    QVAR daily and Albuterol as needed  . Bladder infection    taking Keflex daily   . Chronic back pain    reason unknown  . Diabetes mellitus    takes Metformin and Tradjenta daily  . GERD (gastroesophageal reflux disease)    takes Omeprazole daily  . Hard  of hearing   . Headache    daily.Takes Excedrine daily  . Hepatitis C   . History of blood transfusion    no abnormal reaction noted  . Hyperlipidemia    takes Atorvastatin daily  . Hypertension    takes Quinapril daily  . Joint pain   . Joint swelling   . Nocturia   . Obesity   . Osteoarthritis of left knee 02/23/2012  . Seasonal allergies    takes Claritin and Singulair daily.Nasal spray as needed  . Shortness of breath dyspnea    with exertion   Past Surgical History:  Procedure Laterality Date  . ABDOMINAL HYSTERECTOMY    . EYE SURGERY  2021  . INJECTION KNEE  04/09/2012   Procedure: KNEE INJECTION;  Surgeon: Johnny Bridge, MD;  Location: [place];  Service: Orthopedics;  Laterality: Left;  . JOINT REPLACEMENT     acl   bil knees  . KNEE ARTHROTOMY Left 08/28/2015   Procedure: LEFT KNEE ARTHROFIBROSIS EXCISION; pylectomy;  Surgeon: Meredith Pel, MD;  Location: [place];  Service: Orthopedics;  Laterality: Left;  . KNEE CLOSED REDUCTION  04/09/2012   Procedure: CLOSED MANIPULATION KNEE;  Surgeon: Johnny Bridge, MD;  Location: [place];  Service: Orthopedics;  Laterality: Left;  Manipulation Knee with Anesthesia includes Application of Traction   . TOTAL KNEE ARTHROPLASTY  02/23/2012   Procedure: TOTAL KNEE ARTHROPLASTY;  Surgeon: Johnny Bridge, MD;  Location: [place];  Service: Orthopedics;  Laterality: Left;   Family History  Problem Relation Age of Onset  . Bronchitis Father 72  . Alzheimer's disease Mother   . Ovarian cancer Mother   . Stomach cancer Mother   . High blood pressure Sister   . High blood pressure Sister   . Headache Sister        pt thinks   . High blood pressure Sister   . High blood pressure Sister        pt thinks  . Breast cancer Sister   . High blood pressure Sister   . High blood pressure Sister   . Headache Daughter   . Diabetes Daughter   . Diabetes Daughter   . Diabetes Other   . Colon cancer Neg  Hx   . Esophageal cancer Neg Hx   . Rectal cancer Neg Hx    Social History   Socioeconomic History  . Marital status: Widowed    Spouse name: Not on file  . Number of children: 4  . Years of education: 80  . Highest education level: Some college, no degree  Occupational History  . Not on file  Tobacco Use  . Smoking status: Never Smoker  . Smokeless tobacco: Never Used  Vaping Use  . Vaping Use: Never used  Substance and Sexual Activity  . Alcohol use: No    Alcohol/week: 0.0 standard drinks  . Drug  use: No  . Sexual activity: Yes    Birth control/protection: Surgical  Other Topics Concern  . Not on file  Social History Narrative   Diet: none   Do you drink/eat things with caffeine ? Coffee    Material status: widow     What year were you married? 08/01/1978   Do you live in a house, apartment, assisted living,condo, trailer,ect.)? Townhouse (temporary)    Is it one or more stories? Yes   How many persons live in your home? Two   Do you have any pets in your home ? Yes, Shih-tzu   Current or past profession: none   Do you exercise? No  Type & how often: no   Do you have a living will ? No   Do you have a DNR form? No   If not, do you want to discuss one?  Not now   Do you have signed POA /HPOA forms? No    If so, please bring to your appointment.            Update 12/13/2018: pt states she doesn't drink much coffee, tea   She lives alone                     Social Determinants of Health   Financial Resource Strain:   . Difficulty of Paying Living Expenses: Not on file  Food Insecurity:   . Worried About Charity fundraiser in the Last Year: Not on file  . Ran Out of Food in the Last Year: Not on file  Transportation Needs:   . Lack of Transportation (Medical): Not on file  . Lack of Transportation (Non-Medical): Not on file  Physical Activity:   . Days of Exercise per Week: Not on file  . Minutes of Exercise per Session: Not on file  Stress:   . Feeling  of Stress : Not on file  Social Connections:   . Frequency of Communication with Friends and Family: Not on file  . Frequency of Social Gatherings with Friends and Family: Not on file  . Attends Religious Services: Not on file  . Active Member of Clubs or Organizations: Not on file  . Attends Archivist Meetings: Not on file  . Marital Status: Not on file    Tobacco Counseling Counseling given: Not Answered   Clinical Intake:  Pre-visit preparation completed: Yes  Pain Score: 8  Pain Type: Chronic pain Pain Location: Knee Pain Orientation: Left Pain Descriptors / Indicators: Aching Pain Onset: More than a month ago Pain Frequency: Constant  Pain Score: 8 Pain Type: Chronic pain Pain Location: Back Pain Orientation: Lower Pain Descriptors / Indicators: Aching Pain Frequency: Constant  BMI - recorded: 41 Nutritional Status: BMI > 30  Obese Nutritional Risks: Nausea/ vomitting/ diarrhea Diabetes: Yes  How often do you need to have someone help you when you read instructions, pamphlets, or other written materials from your doctor or pharmacy?: 3 - Sometimes  Diabetic?yes         Activities of Daily Living In your present state of health, do you have any difficulty performing the following activities: 02/24/2020  Hearing? Y  Vision? N  Difficulty concentrating or making decisions? N  Walking or climbing stairs? Y  Comment uses walker  Dressing or bathing? N  Doing errands, shopping? N  Preparing Food and eating ? N  Using the Toilet? N  In the past six months, have you accidently leaked urine? Tabitha Mclean  Do you have problems with loss of bowel control? N  Managing your Medications? N  Managing your Finances? N  Housekeeping or managing your Housekeeping? N  Some recent data might be hidden    Patient Care Team: [name], NP as PCP - General (Geriatric Medicine) [name], MD as PCP - Cardiology (Cardiology) [name], MD  as Consulting Physician (Neurology) [name], MD as Consulting Physician (Ophthalmology)  Indicate any recent Medical Services you may have received from other than [facility] providers in the past year (date may be approximate).     Assessment:   This is a routine wellness examination for Tabitha Oneal.  Hearing/Vision screen  Hearing Screening   125Hz  250Hz  500Hz  1000Hz  2000Hz  3000Hz  4000Hz  6000Hz  8000Hz   Right ear:           Left ear:           Comments: No hearing issues   Vision Screening Comments: Last eye exam unknown   Dietary issues and exercise activities discussed: Current Exercise Habits: Home exercise routine, Type of exercise: calisthenics, Time (Minutes): 35, Frequency (Times/Week): 3, Weekly Exercise (Minutes/Week): 105, Exercise limited by: orthopedic condition(s)  Goals    . Exercise 3x per week (30 min per time)     Patient will look into gyms and water exercises      Depression Screen PHQ 2/9 Scores 02/24/2020 02/15/2020 03/30/2019 02/23/2019 02/23/2019 09/23/2018 02/11/2018  PHQ - 2 Score 0 0 0 0 0 0 0    Fall Risk Fall Risk  02/24/2020 02/15/2020 03/30/2019 02/23/2019 02/23/2019  Falls in the past year? 0 0 0 0 0  Number falls in past yr: 0 0 - 0 0  Injury with Fall? 0 0 - 0 0    Any stairs in or around the home? No  If so, are there any without handrails? No  Home free of loose throw rugs in walkways, pet beds, electrical cords, etc? Yes  Adequate lighting in your home to reduce risk of falls? Yes   ASSISTIVE DEVICES UTILIZED TO PREVENT FALLS:  Life alert? No  Use of a cane, walker or w/c? Yes  Grab bars in the bathroom? Yes  Shower chair or bench in shower? No  Elevated toilet seat or a handicapped toilet? No   TIMED UP AND GO:  Was the test performed? No .  L Cognitive Function: MMSE - Mini Mental State Exam 05/18/2019  Orientation to time 5  Orientation to Place 5  Registration 3  Attention/ Calculation 5  Recall 3  Language- name 2 objects 2    Language- repeat 1  Language- follow 3 step command 3  Language- read & follow direction 1  Write a sentence 1  Copy design 1  Total score 30     6CIT Screen 02/24/2020  What Year? 0 points  What month? 0 points  What time? 0 points  Count back from 20 0 points  Months in reverse 0 points  Repeat phrase 4 points  Total Score 4    Immunizations Immunization History  Administered Date(s) Administered  . Fluad Quad(high Dose 65+) 01/24/2019, 02/15/2020  . Influenza Split 02/24/2012  . Influenza,inj,Quad PF,6+ Mos 04/13/2015, 02/22/2016, 01/28/2017, 02/11/2018  . Moderna SARS-COVID-2 Vaccination 07/30/2019, 08/29/2019  . Pneumococcal Polysaccharide-23 07/31/2016  . Td 09/03/1995    TDAP status: Due, Education has been provided regarding the importance of this vaccine. Advised may receive this vaccine at local pharmacy or Health Dept. Aware to provide a copy of the  vaccination record if obtained from local pharmacy or Health Dept. Verbalized acceptance and understanding. Flu Vaccine status: Up to date DUE for pnevar 13 Covid-19 vaccine status: Completed vaccines  Qualifies for Shingles Vaccine? Yes   Zostavax completed No   Shingrix Completed?: No.    Education has been provided regarding the importance of this vaccine. Patient has been advised to call insurance company to determine out of pocket expense if they have not yet received this vaccine. Advised may also receive vaccine at local pharmacy or Health Dept. Verbalized acceptance and understanding.  Screening Tests Health Maintenance  Topic Date Due  . TETANUS/TDAP  09/02/2005  . DEXA SCAN  12/18/2019  . PNA vac Low Risk Adult (1 of 2 - PCV13) 12/18/2019  . OPHTHALMOLOGY EXAM  03/09/2020  . HEMOGLOBIN A1C  08/14/2020  . MAMMOGRAM  12/27/2020  . FOOT EXAM  02/14/2021  . COLONOSCOPY  02/03/2026  . INFLUENZA VACCINE  Completed  . COVID-19 Vaccine  Completed  . Hepatitis C Screening  Completed  . PAP SMEAR-Modifier   Discontinued  . HIV Screening  Discontinued    Health Maintenance  Health Maintenance Due  Topic Date Due  . TETANUS/TDAP  09/02/2005  . DEXA SCAN  12/18/2019  . PNA vac Low Risk Adult (1 of 2 - PCV13) 12/18/2019    Colorectal cancer screening: Completed 2017. Repeat every 10 years Mammogram scheduled Bone Density status: Ordered and scheduled for nov, 18, 2021. Pt provided with contact info and advised to call to schedule appt.  Lung Cancer Screening: (Low Dose CT Chest recommended if Age 21-80 years, 30 pack-year currently smoking OR have quit w/in 15years.) does not qualify.   Lung Cancer Screening Referral: na  Additional Screening:  Hepatitis C Screening: does qualify; Completed 01/2019   Vision Screening: Recommended annual ophthalmology exams for early detection of glaucoma and other disorders of the eye. Is the patient up to date with their annual eye exam?  Yes  Who is the provider or what is the name of the office in which the patient attends annual eye exams? [facility] If pt is not established with a provider, would they like to be referred to a provider to establish care? No .   Dental Screening: Recommended annual dental exams for proper oral hygiene  Community Resource Referral / Chronic Care Management: CRR required this visit?  No   CCM required this visit?  No      Plan:     I have personally reviewed and noted the following in the patient's chart:   . Medical and social history . Use of alcohol, tobacco or illicit drugs  . Current medications and supplements . Functional ability and status . Nutritional status . Physical activity . Advanced directives . List of other physicians . Hospitalizations, surgeries, and ER visits in previous 12 months . Vitals . Screenings to include cognitive, depression, and falls . Referrals and appointments  In addition, I have reviewed and discussed with patient certain preventive protocols, quality metrics,  and best practice recommendations. A written personalized care plan for preventive services as well as general preventive health recommendations were provided to patient.     [name], NP   02/24/2020    Virtual Visit via Telephone Note  I connected with@ on 02/24/20 at 10:00 AM EDT by telephone and verified that I am speaking with the correct person using two identifiers.  Location: Patient: home Provider: [place]   I discussed the limitations, risks, security and privacy  concerns of performing an evaluation and management service by telephone and the availability of in person appointments. I also discussed with the patient that there may be a patient responsible charge related to this service. The patient expressed understanding and agreed to proceed.   I discussed the assessment and treatment plan with the patient. The patient was provided an opportunity to ask questions and all were answered. The patient agreed with the plan and demonstrated an understanding of the instructions.   The patient was advised to call back or seek an in-person evaluation if the symptoms worsen or if the condition fails to improve as anticipated.  I provided 25 minutes of non-face-to-face time during this encounter.  Tabitha Oneal. Tabitha Oneal Avs printed and mailed

## 2020-02-24 NOTE — Telephone Encounter (Signed)
[name], Tabitha Oneal are scheduled for a virtual visit with your provider today.    Just as we do with appointments in the office, we must obtain your consent to participate.  Your consent will be active for this visit and any virtual visit you may have with one of our providers in the next 365 days.    If you have a MyChart account, I can also send a copy of this consent to you electronically.  All virtual visits are billed to your insurance company just like a traditional visit in the office.  As this is a virtual visit, video technology does not allow for your provider to perform a traditional examination.  This may limit your provider's ability to fully assess your condition.  If your provider identifies any concerns that need to be evaluated in person or the need to arrange testing such as labs, EKG, etc, we will make arrangements to do so.    Although advances in technology are sophisticated, we cannot ensure that it will always work on either your end or our end.  If the connection with a video visit is poor, we may have to switch to a telephone visit.  With either a video or telephone visit, we are not always able to ensure that we have a secure connection.   I need to obtain your verbal consent now.   Are you willing to proceed with your visit today?   Tabitha Oneal has provided verbal consent on 02/24/2020 for a virtual visit (video or telephone).   Leigh Aurora [place], [place] 02/24/2020  9:18 AM

## 2020-02-29 ENCOUNTER — Encounter: Payer: Self-pay | Admitting: Physical Therapy

## 2020-02-29 ENCOUNTER — Ambulatory Visit: Payer: Medicare Other | Admitting: Physical Therapy

## 2020-02-29 ENCOUNTER — Other Ambulatory Visit: Payer: Self-pay

## 2020-02-29 DIAGNOSIS — R2689 Other abnormalities of gait and mobility: Secondary | ICD-10-CM | POA: Diagnosis not present

## 2020-02-29 DIAGNOSIS — M545 Low back pain, unspecified: Secondary | ICD-10-CM | POA: Diagnosis not present

## 2020-02-29 DIAGNOSIS — M79672 Pain in left foot: Secondary | ICD-10-CM | POA: Diagnosis not present

## 2020-02-29 DIAGNOSIS — M6281 Muscle weakness (generalized): Secondary | ICD-10-CM | POA: Diagnosis not present

## 2020-02-29 DIAGNOSIS — M25662 Stiffness of left knee, not elsewhere classified: Secondary | ICD-10-CM

## 2020-02-29 DIAGNOSIS — M25562 Pain in left knee: Secondary | ICD-10-CM | POA: Diagnosis not present

## 2020-02-29 DIAGNOSIS — G8929 Other chronic pain: Secondary | ICD-10-CM

## 2020-02-29 NOTE — Therapy (Signed)
[place], [place], [address] Phone: [phone]   Fax:  [phone]  Physical Therapy Treatment  Patient Details  Name: Tabitha Oneal MRN: [id number] Date of Birth: [birth year] Referring Provider (PT): Landis Martins, [place]   Encounter Date: 02/29/2020   PT End of Session - 02/29/20 1409    Visit Number 11    Number of Visits 16    Date for PT Re-Evaluation 03/12/20    Authorization Type UHC MCR    Progress Note Due on Visit 20    PT Start Time 1358    PT Stop Time 1440    PT Time Calculation (min) 42 min    Activity Tolerance Patient tolerated treatment well    Behavior During Therapy [facility] for tasks assessed/performed           Past Medical History:  Diagnosis Date  . Asthma    QVAR daily and Albuterol as needed  . Bladder infection    taking Keflex daily   . Chronic back pain    reason unknown  . Diabetes mellitus    takes Metformin and Tradjenta daily  . GERD (gastroesophageal reflux disease)    takes Omeprazole daily  . Hard of hearing   . Headache    daily.Takes Excedrine daily  . Hepatitis C   . History of blood transfusion    no abnormal reaction noted  . Hyperlipidemia    takes Atorvastatin daily  . Hypertension    takes Quinapril daily  . Joint pain   . Joint swelling   . Nocturia   . Obesity   . Osteoarthritis of left knee 02/23/2012  . Seasonal allergies    takes Claritin and Singulair daily.Nasal spray as needed  . Shortness of breath dyspnea    with exertion    Past Surgical History:  Procedure Laterality Date  . ABDOMINAL HYSTERECTOMY    . EYE SURGERY  2021  . INJECTION KNEE  04/09/2012   Procedure: KNEE INJECTION;  Surgeon: Johnny Bridge, MD;  Location: [place];  Service: Orthopedics;  Laterality: Left;  . JOINT REPLACEMENT     acl   bil knees  . KNEE ARTHROTOMY Left 08/28/2015   Procedure: LEFT KNEE ARTHROFIBROSIS EXCISION;  pylectomy;  Surgeon: Meredith Pel, MD;  Location: [place];  Service: Orthopedics;  Laterality: Left;  . KNEE CLOSED REDUCTION  04/09/2012   Procedure: CLOSED MANIPULATION KNEE;  Surgeon: Johnny Bridge, MD;  Location: [place];  Service: Orthopedics;  Laterality: Left;  Manipulation Knee with Anesthesia includes Application of Traction   . TOTAL KNEE ARTHROPLASTY  02/23/2012   Procedure: TOTAL KNEE ARTHROPLASTY;  Surgeon: Johnny Bridge, MD;  Location: [place];  Service: Orthopedics;  Laterality: Left;    There were no vitals filed for this visit.   Subjective Assessment - 02/29/20 1408    Subjective Patient reports she is doing well. She is using her cane today and she went to [employer] and got shoe inserts for plantar fasciitis.    Patient Stated Goals Get rid of pain so she can move better    Currently in Pain? Yes    Pain Score 5     Pain Location Foot    Pain Orientation Left    Pain Descriptors / Indicators Aching    Pain Type Chronic pain    Pain Onset More than a month ago    Pain Frequency Constant    Aggravating Factors  Walking    Pain Relieving Factors Rest                             [facility] Adult PT Treatment/Exercise - 02/29/20 0001      Neuro Re-ed    Neuro Re-ed Details  Tandem stance 4 x 30 sec each   minimal use of UE for support     Exercises   Exercises Knee/Hip;Ankle      Knee/Hip Exercises: Stretches   Other Knee/Hip Stretches Seated lumbar flexion using physioball 5 x 10 sec fwd and lateral      Knee/Hip Exercises: Standing   Hip Abduction 2 sets;10 reps    Abduction Limitations green below knees    Hip Extension 2 sets;10 reps    Extension Limitations green below knees    Gait Training SPC, patient instructed to use cane on right side opposite left leg, instructed in proper sequence and step length, posture      Ankle Exercises: Stretches   Slant Board Stretch 3 reps;30 seconds      Ankle Exercises: Standing    Heel Raises 15 reps   3 sets   Heel Raises Limitations ball squeeze between heels, feet on Airex                  PT Education - 02/29/20 1409    Education Details HEP    Person(s) Educated Patient    Methods Explanation    Comprehension Verbalized understanding;Need further instruction            PT Short Term Goals - 02/24/20 1111      PT SHORT TERM GOAL #1   Title Patient will be I with initial HEP to progress with PT    Time 4    Period Weeks    Status On-going    Target Date 02/13/20      PT SHORT TERM GOAL #2   Title Patient will exhibit >/= 5 deg left ankle dorsiflexion to improve gait    Baseline 5 deg    Time 4    Period Weeks    Status Achieved    Target Date 02/13/20      PT SHORT TERM GOAL #3   Title Patient will be able to ambulation household distances without CAM boot and </= 5/10 pain to improve mobility    Baseline Patient reports 5/10 pain, 2-3/10 with arch taping    Time 4    Period Weeks    Status Achieved    Target Date 02/13/20             PT Long Term Goals - 01/16/20 1106      PT LONG TERM GOAL #1   Title Patient will be I with final HEP to maintain progress from PT    Time 8    Period Weeks    Status New    Target Date 03/12/20      PT LONG TERM GOAL #2   Title Patient will exhibit >/= 45 deg knee flexion to improve transfer ability    Time 8    Period Weeks    Status New    Target Date 03/12/20      PT LONG TERM GOAL #3   Title Patient will exhibit left ankle strength grossly 5/5 MMT to improve walking tolerance and arch control    Time 8    Period Weeks    Status New  Target Date 03/12/20      PT LONG TERM GOAL #4   Title Patient will be able to ambulate community level distance with LRAD and </= 5/10 pain level to improve grocery shopping    Time 8    Period Weeks    Status New    Target Date 03/12/20      PT LONG TERM GOAL #5   Title Patient will report improve functional level of </= 42% limitation of  FOTO    Time 8    Period Weeks    Status New    Target Date 03/12/20                 Plan - 02/29/20 1410    Clinical Impression Statement Patient tolerated therapy well with no adverse effects. She brought generic arch support inserts this visit so they were cut to fit her shoes and patient reported improvement in foot pain with walking. She was reinstructed on proper cane use, sequencing and step-through pattern. Continued work on strengthening with good tolerance. She would benefit from continued skilled PT to progress her strength and motion in order to improve her walking and maximize functional mobility.    PT Treatment/Interventions ADLs/Self Care Home Management;Aquatic Therapy;Cryotherapy;Electrical Stimulation;Iontophoresis 4mg /ml Dexamethasone;Moist Heat;Neuromuscular re-education;Balance training;Therapeutic exercise;Therapeutic activities;Functional mobility training;Stair training;Gait training;Patient/family education;Manual techniques;Dry needling;Passive range of motion;Taping;Joint Manipulations    PT Next Visit Plan Assess HEP and progress PRN, progress left calf flexibility and ankle strength, manual and stretching for left knee range of motion as able    PT Home Exercise Plan [id number]: seated banded inversion with yellow, seated towel scrunch, seated plantar fascia mobilization with ball, standing heel raises, SLR, standing hip abduction and extension with green, seated lumbar flexion and rotation stretch, standing calf and great toe stretching, tandem balance at counter    Consulted and Agree with Plan of Care Patient           Patient will benefit from skilled therapeutic intervention in order to improve the following deficits and impairments:  Abnormal gait, Decreased range of motion, Difficulty walking, Pain, Decreased activity tolerance, Decreased balance, Improper body mechanics, Decreased strength, Decreased mobility, Impaired flexibility  Visit  Diagnosis: Pain in left foot  Chronic pain of left knee  Stiffness of left knee, not elsewhere classified  Chronic bilateral low back pain, unspecified whether sciatica present  Muscle weakness (generalized)  Other abnormalities of gait and mobility     Problem List Patient Active Problem List   Diagnosis Date Noted  . Pain in left knee 09/13/2019  . 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 Abnormal EKG 03/02/2019  . Incomplete left bundle branch block (LBBB) 03/02/2019  . Rebound headache 12/13/2018  . Overuse of medication 12/13/2018  . Intractable chronic migraine without aura and without status migrainosus 12/13/2018  . Microcytic anemia 02/24/2018  . High risk medication use 08/06/2017  . Seasonal and perennial allergic rhinitis 07/30/2017  . Recurrent infections 07/30/2017  . Cough 06/08/2017  . Moderate persistent asthma without complication 50/27/7412  . Chronic constipation 02/04/2017  . Hyperlipidemia due to type 2 diabetes mellitus ([place]) 02/04/2017  . Hallux rigidus, left foot 01/01/2017  . Postmenopausal symptoms 02/22/2016  . Arthrofibrosis of knee joint 08/28/2015  . Left hip pain 10/08/2014  . Type 2 diabetes mellitus without complication, without long-term current use of insulin ([place]) 10/08/2014  . Hyperlipidemia LDL goal <100 10/08/2014  . Essential hypertension, benign 08/30/2014  . Diabetes mellitus, type 2 ([place]) 08/30/2014  .  Diverticulosis of colon without hemorrhage 08/30/2014  . Chronic low back pain 08/30/2014  . Chronic pain syndrome 08/30/2014  . Asthma in adult without complication 74/25/5258  . 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 Osteoarthritis of left knee 02/23/2012    Hilda Blades, PT, DPT, LAT, ATC 02/29/20  2:46 PM Phone: [phone] Fax: [facility] [address] [place], [place], [address] Phone:  [phone]   Fax:  [phone]  Name: Tabitha Oneal MRN: [id number] Date of Birth: 1955-11-15

## 2020-03-01 ENCOUNTER — Other Ambulatory Visit: Payer: Self-pay | Admitting: Allergy & Immunology

## 2020-03-02 ENCOUNTER — Encounter: Payer: Self-pay | Admitting: Physical Therapy

## 2020-03-02 ENCOUNTER — Other Ambulatory Visit: Payer: Self-pay

## 2020-03-02 ENCOUNTER — Ambulatory Visit: Payer: Medicare Other | Admitting: Physical Therapy

## 2020-03-02 DIAGNOSIS — G8929 Other chronic pain: Secondary | ICD-10-CM

## 2020-03-02 DIAGNOSIS — M79672 Pain in left foot: Secondary | ICD-10-CM

## 2020-03-02 DIAGNOSIS — R2689 Other abnormalities of gait and mobility: Secondary | ICD-10-CM | POA: Diagnosis not present

## 2020-03-02 DIAGNOSIS — M25662 Stiffness of left knee, not elsewhere classified: Secondary | ICD-10-CM | POA: Diagnosis not present

## 2020-03-02 DIAGNOSIS — M6281 Muscle weakness (generalized): Secondary | ICD-10-CM | POA: Diagnosis not present

## 2020-03-02 DIAGNOSIS — M545 Low back pain, unspecified: Secondary | ICD-10-CM | POA: Diagnosis not present

## 2020-03-02 DIAGNOSIS — M25562 Pain in left knee: Secondary | ICD-10-CM | POA: Diagnosis not present

## 2020-03-02 NOTE — Therapy (Signed)
[place], [place], [address] Phone: [phone]   Fax:  [phone]  Physical Therapy Treatment  Patient Details  Name: Tabitha Oneal MRN: [id number] Date of Birth: February 17, 1956 Referring Provider (PT): Landis Martins, [place]   Encounter Date: 03/02/2020   PT End of Session - 03/02/20 1308    Visit Number 12    Number of Visits 16    Date for PT Re-Evaluation 03/12/20    Authorization Type UHC MCR    Progress Note Due on Visit 20    PT Start Time 1300    PT Stop Time 1340    PT Time Calculation (min) 40 min    Activity Tolerance Patient tolerated treatment well    Behavior During Therapy [facility] for tasks assessed/performed           Past Medical History:  Diagnosis Date  . Asthma    QVAR daily and Albuterol as needed  . Bladder infection    taking Keflex daily   . Chronic back pain    reason unknown  . Diabetes mellitus    takes Metformin and Tradjenta daily  . GERD (gastroesophageal reflux disease)    takes Omeprazole daily  . Hard of hearing   . Headache    daily.Takes Excedrine daily  . Hepatitis C   . History of blood transfusion    no abnormal reaction noted  . Hyperlipidemia    takes Atorvastatin daily  . Hypertension    takes Quinapril daily  . Joint pain   . Joint swelling   . Nocturia   . Obesity   . Osteoarthritis of left knee 02/23/2012  . Seasonal allergies    takes Claritin and Singulair daily.Nasal spray as needed  . Shortness of breath dyspnea    with exertion    Past Surgical History:  Procedure Laterality Date  . ABDOMINAL HYSTERECTOMY    . EYE SURGERY  2021  . INJECTION KNEE  04/09/2012   Procedure: KNEE INJECTION;  Surgeon: Johnny Bridge, MD;  Location: [place];  Service: Orthopedics;  Laterality: Left;  . JOINT REPLACEMENT     acl   bil knees  . KNEE ARTHROTOMY Left 08/28/2015   Procedure: LEFT KNEE ARTHROFIBROSIS EXCISION;  pylectomy;  Surgeon: Meredith Pel, MD;  Location: [place];  Service: Orthopedics;  Laterality: Left;  . KNEE CLOSED REDUCTION  04/09/2012   Procedure: CLOSED MANIPULATION KNEE;  Surgeon: Johnny Bridge, MD;  Location: [place];  Service: Orthopedics;  Laterality: Left;  Manipulation Knee with Anesthesia includes Application of Traction   . TOTAL KNEE ARTHROPLASTY  02/23/2012   Procedure: TOTAL KNEE ARTHROPLASTY;  Surgeon: Johnny Bridge, MD;  Location: [place];  Service: Orthopedics;  Laterality: Left;    There were no vitals filed for this visit.   Subjective Assessment - 03/02/20 1305    Subjective Patient reports she has been working on walking with her cane more. She does feel a little bit wobbly.    Patient Stated Goals Get rid of pain so she can move better    Currently in Pain? Yes    Pain Score 2     Pain Location Foot    Pain Orientation Left    Pain Descriptors / Indicators Aching    Pain Type Chronic pain    Pain Onset More than a month ago    Pain Frequency Intermittent    Pain Score 3    Pain  Location Back    Pain Orientation Lower    Pain Descriptors / Indicators Aching    Pain Type Chronic pain    Pain Onset More than a month ago    Pain Frequency Constant              [facility] PT Assessment - 03/02/20 0001      Assessment   Medical Diagnosis Plantar fasciitis of left foot, Left knee pain, Low back pain     Referring Provider (PT) Landis Martins, DPM                         [facility] Adult PT Treatment/Exercise - 03/02/20 0001      Neuro Re-ed    Neuro Re-ed Details  Tandem stance 4 x 30 sec each, Split stance 4 x 30 sec each, Split stance weight shifts x 2 min each      Knee/Hip Exercises: Standing   Gait Training SPC, patient instructed to use cane on right side opposite left leg, instructed in proper sequence and step length, posture                  PT Education - 03/02/20 1308    Education Details HEP, using  SPC on right side    Person(s) Educated Patient    Methods Explanation;Demonstration;Verbal cues    Comprehension Verbalized understanding;Returned demonstration;Verbal cues required;Need further instruction            PT Short Term Goals - 02/24/20 1111      PT SHORT TERM GOAL #1   Title Patient will be I with initial HEP to progress with PT    Time 4    Period Weeks    Status On-going    Target Date 02/13/20      PT SHORT TERM GOAL #2   Title Patient will exhibit >/= 5 deg left ankle dorsiflexion to improve gait    Baseline 5 deg    Time 4    Period Weeks    Status Achieved    Target Date 02/13/20      PT SHORT TERM GOAL #3   Title Patient will be able to ambulation household distances without CAM boot and </= 5/10 pain to improve mobility    Baseline Patient reports 5/10 pain, 2-3/10 with arch taping    Time 4    Period Weeks    Status Achieved    Target Date 02/13/20             PT Long Term Goals - 01/16/20 1106      PT LONG TERM GOAL #1   Title Patient will be I with final HEP to maintain progress from PT    Time 8    Period Weeks    Status New    Target Date 03/12/20      PT LONG TERM GOAL #2   Title Patient will exhibit >/= 45 deg knee flexion to improve transfer ability    Time 8    Period Weeks    Status New    Target Date 03/12/20      PT LONG TERM GOAL #3   Title Patient will exhibit left ankle strength grossly 5/5 MMT to improve walking tolerance and arch control    Time 8    Period Weeks    Status New    Target Date 03/12/20      PT LONG TERM GOAL #4   Title Patient will  be able to ambulate community level distance with LRAD and </= 5/10 pain level to improve grocery shopping    Time 8    Period Weeks    Status New    Target Date 03/12/20      PT LONG TERM GOAL #5   Title Patient will report improve functional level of </= 42% limitation of FOTO    Time 8    Period Weeks    Status New    Target Date 03/12/20                  Plan - 03/02/20 1314    Clinical Impression Statement Patient tolerated therapy well with no adverse effects. Patient arrived using quad cane and stated she has SPC at home so focused majority of session on gait training with [place]. Also practiced balance and split stance weight shifts to improve stability with standing and gait. Patient reports improvement in symptoms with her new inserts. She would benefit from continued skilled PT to progress her strength and motion in order to improve her walking and maximize functional mobility.    PT Treatment/Interventions ADLs/Self Care Home Management;Aquatic Therapy;Cryotherapy;Electrical Stimulation;Iontophoresis 4mg /ml Dexamethasone;Moist Heat;Neuromuscular re-education;Balance training;Therapeutic exercise;Therapeutic activities;Functional mobility training;Stair training;Gait training;Patient/family education;Manual techniques;Dry needling;Passive range of motion;Taping;Joint Manipulations    PT Next Visit Plan Assess HEP and progress PRN, progress left calf flexibility and ankle strength, manual and stretching for left knee range of motion as able    PT Home Exercise Plan [id number]: seated banded inversion with yellow, seated towel scrunch, seated plantar fascia mobilization with ball, standing heel raises, SLR, standing hip abduction and extension with green, seated lumbar flexion and rotation stretch, standing calf and great toe stretching, tandem balance at counter    Consulted and Agree with Plan of Care Patient           Patient will benefit from skilled therapeutic intervention in order to improve the following deficits and impairments:  Abnormal gait, Decreased range of motion, Difficulty walking, Pain, Decreased activity tolerance, Decreased balance, Improper body mechanics, Decreased strength, Decreased mobility, Impaired flexibility  Visit Diagnosis: Pain in left foot  Chronic pain of left knee  Stiffness of left knee, not  elsewhere classified  Chronic bilateral low back pain, unspecified whether sciatica present  Muscle weakness (generalized)  Other abnormalities of gait and mobility     Problem List Patient Active Problem List   Diagnosis Date Noted  . Pain in left knee 09/13/2019  . 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 Abnormal EKG 03/02/2019  . Incomplete left bundle branch block (LBBB) 03/02/2019  . Rebound headache 12/13/2018  . Overuse of medication 12/13/2018  . Intractable chronic migraine without aura and without status migrainosus 12/13/2018  . Microcytic anemia 02/24/2018  . High risk medication use 08/06/2017  . Seasonal and perennial allergic rhinitis 07/30/2017  . Recurrent infections 07/30/2017  . Cough 06/08/2017  . Moderate persistent asthma without complication 33/35/4562  . Chronic constipation 02/04/2017  . Hyperlipidemia due to type 2 diabetes mellitus ([place]) 02/04/2017  . Hallux rigidus, left foot 01/01/2017  . Postmenopausal symptoms 02/22/2016  . Arthrofibrosis of knee joint 08/28/2015  . Left hip pain 10/08/2014  . Type 2 diabetes mellitus without complication, without long-term current use of insulin ([place]) 10/08/2014  . Hyperlipidemia LDL goal <100 10/08/2014  . Essential hypertension, benign 08/30/2014  . Diabetes mellitus, type 2 ([place]) 08/30/2014  . Diverticulosis of colon without hemorrhage 08/30/2014  . Chronic low back pain 08/30/2014  .  Chronic pain syndrome 08/30/2014  . Asthma in adult without complication 02/98/4730  . 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 Osteoarthritis of left knee 02/23/2012    Hilda Blades, PT, DPT, LAT, ATC 03/02/20  1:45 PM Phone: [phone] Fax: [facility] [address] [place], [place], [address] Phone: [phone]   Fax:  [phone]  Name: Tabitha Oneal MRN: [id number] Date of  Birth: July 30, 1955

## 2020-03-04 DIAGNOSIS — G4733 Obstructive sleep apnea (adult) (pediatric): Secondary | ICD-10-CM | POA: Diagnosis not present

## 2020-03-06 DIAGNOSIS — H26493 Other secondary cataract, bilateral: Secondary | ICD-10-CM | POA: Diagnosis not present

## 2020-03-06 DIAGNOSIS — H43393 Other vitreous opacities, bilateral: Secondary | ICD-10-CM | POA: Diagnosis not present

## 2020-03-06 DIAGNOSIS — Z961 Presence of intraocular lens: Secondary | ICD-10-CM | POA: Diagnosis not present

## 2020-03-06 DIAGNOSIS — E119 Type 2 diabetes mellitus without complications: Secondary | ICD-10-CM | POA: Diagnosis not present

## 2020-03-06 LAB — HM DIABETES EYE EXAM

## 2020-03-07 ENCOUNTER — Encounter: Payer: Self-pay | Admitting: Cardiology

## 2020-03-07 ENCOUNTER — Ambulatory Visit: Payer: Medicare Other | Admitting: Cardiology

## 2020-03-07 ENCOUNTER — Other Ambulatory Visit: Payer: Self-pay

## 2020-03-07 VITALS — BP 132/64 | HR 94 | Ht 62.5 in | Wt 222.0 lb

## 2020-03-07 DIAGNOSIS — E1169 Type 2 diabetes mellitus with other specified complication: Secondary | ICD-10-CM | POA: Diagnosis not present

## 2020-03-07 DIAGNOSIS — Z7189 Other specified counseling: Secondary | ICD-10-CM | POA: Diagnosis not present

## 2020-03-07 DIAGNOSIS — R011 Cardiac murmur, unspecified: Secondary | ICD-10-CM

## 2020-03-07 DIAGNOSIS — Z6839 Body mass index (BMI) 39.0-39.9, adult: Secondary | ICD-10-CM

## 2020-03-07 DIAGNOSIS — E785 Hyperlipidemia, unspecified: Secondary | ICD-10-CM

## 2020-03-07 DIAGNOSIS — I10 Essential (primary) hypertension: Secondary | ICD-10-CM

## 2020-03-07 DIAGNOSIS — I447 Left bundle-branch block, unspecified: Secondary | ICD-10-CM

## 2020-03-07 MED ORDER — ASPIRIN EC 81 MG PO TBEC: 81.0000 mg | DELAYED_RELEASE_TABLET | Freq: Every day | ORAL | 3 refills | Status: AC

## 2020-03-07 NOTE — Progress Notes (Signed)
Cardiology Office Note:    Date:  03/07/2020   ID:  Terrel, Manalo December 16, 1955, MRN [id number]  PCP:  [name], NP  Cardiologist:  [name], MD  CC: follow up  History of Present Illness:    Tabitha Oneal is a 64 y.o. female with a hx of hypertension, type II diabetes, mild asthma, anemia, hyperlipidemia, incomplete LBBB who is seen for follow up today.   Today: Doing well overall from a heart perspective. Last A1c 8.4, had metformin increased. We discussed diet and exercise. She is trying to eat better, avoids salt, but has had some sweets. Leg in a brace, has been able to get only very limited activity outside of physical therapy.  Has been struggling to lose weight, feels frustrated by this.   Denies chest pain, shortness of breath at rest or with normal exertion. No PND, orthopnea, or unexpected weight gain. No syncope or palpitations.  Mild intermittent LE edema, worse when on her feet for a while. Discussed aspirin today.  Past Medical History:  Diagnosis Date   Asthma    QVAR daily and Albuterol as needed   Bladder infection    taking Keflex daily    Chronic back pain    reason unknown   Diabetes mellitus    takes Metformin and Tradjenta daily   GERD (gastroesophageal reflux disease)    takes Omeprazole daily   Hard of hearing    Headache    daily.Takes Excedrine daily   Hepatitis C    History of blood transfusion    no abnormal reaction noted   Hyperlipidemia    takes Atorvastatin daily   Hypertension    takes Quinapril daily   Joint pain    Joint swelling    Nocturia    Obesity    Osteoarthritis of left knee 02/23/2012   Seasonal allergies    takes Claritin and Singulair daily.Nasal spray as needed   Shortness of breath dyspnea    with exertion    Past Surgical History:  Procedure Laterality Date   ABDOMINAL HYSTERECTOMY     EYE SURGERY  2021   INJECTION KNEE  04/09/2012   Procedure:  KNEE INJECTION;  Surgeon: Johnny Bridge, MD;  Location: [facility];  Service: Orthopedics;  Laterality: Left;   JOINT REPLACEMENT     acl   bil knees   KNEE ARTHROTOMY Left 08/28/2015   Procedure: LEFT KNEE ARTHROFIBROSIS EXCISION; pylectomy;  Surgeon: Meredith Pel, MD;  Location: [facility];  Service: Orthopedics;  Laterality: Left;   KNEE CLOSED REDUCTION  04/09/2012   Procedure: CLOSED MANIPULATION KNEE;  Surgeon: Johnny Bridge, MD;  Location: [facility];  Service: Orthopedics;  Laterality: Left;  Manipulation Knee with Anesthesia includes Application of Traction    TOTAL KNEE ARTHROPLASTY  02/23/2012   Procedure: TOTAL KNEE ARTHROPLASTY;  Surgeon: Johnny Bridge, MD;  Location: [facility];  Service: Orthopedics;  Laterality: Left;    Current Medications: Current Outpatient Medications on File Prior to Visit  Medication Sig   amLODipine (NORVASC) 10 MG tablet Take 10 mg by mouth daily.   amoxicillin-clavulanate (AUGMENTIN) 875-125 MG tablet Take 1 tablet by mouth 2 (two) times daily.   azelastine (ASTELIN) 0.1 % nasal spray USE 1 SPRAY EACH NOSTRIL TWICE DAILY   BREO ELLIPTA 200-25 MCG/INH AEPB INHALE 1 PUFF BY MOUTH EVERY DAY   diclofenac sodium (VOLTAREN) 1 % GEL Apply 8 g topically 2 (two) times daily as needed.  On knees   ferrous sulfate 325 (65 FE) MG tablet TAKE 1 TABLET BY MOUTH EVERY DAY WITH BREAKFAST   fluticasone (FLONASE) 50 MCG/ACT nasal spray SPRAY 2 SPRAYS INTO EACH NOSTRIL EVERY DAY   gabapentin (NEURONTIN) 100 MG capsule Take 100 mg by mouth 3 (three) times daily.   ipratropium (ATROVENT) 0.03 % nasal spray Place 2 sprays into both nostrils 2 (two) times daily.   JARDIANCE 10 MG TABS tablet Take 10 mg by mouth every morning.    metFORMIN (GLUCOPHAGE) 1000 MG tablet Start with 1000 mg in the morning and 500 mg in the evening x 1 week then increase to 1000 mg in the morning and 1000 mg in the evening   montelukast (SINGULAIR) 10  MG tablet TAKE 1 TABLET BY MOUTH EVERYDAY AT BEDTIME   omeprazole (PRILOSEC) 40 MG capsule TAKE 1 CAPSULE BY MOUTH EVERY DAY   ONE TOUCH ULTRA TEST test strip Check blood sugar once daily as directed   PROAIR RESPICLICK 341 (90 Base) MCG/ACT AEPB INHALE 4 PUFFS INTO THE LUNGS EVERY 4 (FOUR) HOURS AS NEEDED.   quinapril (ACCUPRIL) 40 MG tablet Take 1 tablet (40 mg total) by mouth at bedtime.   rosuvastatin (CRESTOR) 10 MG tablet Take 1 tablet (10 mg total) by mouth daily.   tiZANidine (ZANAFLEX) 4 MG tablet Take 1 tablet (4 mg total) by mouth every 6 (six) hours as needed for muscle spasms. For headaches.   topiramate (TOPAMAX) 100 MG tablet Take 100 mg by mouth at bedtime.   No current facility-administered medications on file prior to visit.     Allergies:   Patient has no known allergies.   Social History   Tobacco Use   Smoking status: Never Smoker   Smokeless tobacco: Never Used  Vaping Use   Vaping Use: Never used  Substance Use Topics   Alcohol use: No    Alcohol/week: 0.0 standard drinks   Drug use: No    Family History: family history includes Alzheimer's disease in her mother; Breast cancer in her sister; Bronchitis (age of onset: 22) in her father; Diabetes in her daughter, daughter and another family member; Headache in her daughter and sister; High blood pressure in her sister, sister, sister, sister, sister, and sister; Ovarian cancer in her mother; Stomach cancer in her mother. There is no history of Colon cancer, Esophageal cancer, or Rectal cancer.  ROS:   Please see the history of present illness.  Additional pertinent ROS otherwise unremarkable.  EKGs/Labs/Other Studies Reviewed:    The following studies were reviewed today: Echo 12/20/2015 - Left ventricle: The cavity size was normal. Wall thickness was   normal. Systolic function was normal. The estimated ejection   fraction was in the range of 55% to 60%. Indeterminant diastolic   function. Wall  motion was normal; there were no regional wall   motion abnormalities. - Aortic valve: There was no stenosis. - Mitral valve: There was trivial regurgitation. - Right ventricle: The cavity size was normal. Systolic function   was normal. - Tricuspid valve: Peak RV-RA gradient (S): 23 mm Hg. - Pulmonary arteries: PA peak pressure: 26 mm Hg (S). - Inferior vena cava: The vessel was normal in size. The   respirophasic diameter changes were in the normal range (= 50%),   consistent with normal central venous pressure.  Impressions:  - Normal LV size with EF 55-60%. Normal RV size and systolic   function. No significant valvular abnormalities.  Carotids 05/06/16 Diffuse plaque with 1-39%  bilateral ICA stenosis. Patent vertebral arteries with antegrade flow. Normal subclavian arteries, bilaterally. f/u PRN   EKG:  EKG is personally reviewed.  The ekg ordered today demonstrates NSR, QRS 102 in the pattern of iLBBB  Recent Labs: 02/15/2020: ALT 12; BUN 11; Creat 0.89; Hemoglobin 11.5; Platelets 298; Potassium 4.2; Sodium 140  Recent Lipid Panel    Component Value Date/Time   CHOL 173 02/15/2020 1027   CHOL 140 11/21/2015 1026   TRIG 98 02/15/2020 1027   HDL 62 02/15/2020 1027   HDL 55 11/21/2015 1026   CHOLHDL 2.8 02/15/2020 1027   VLDL 11 02/20/2016 1000   LDLCALC 92 02/15/2020 1027    Physical Exam:    VS:  BP 132/64    Pulse 94    Ht 5' 2.5" (1.588 m)    Wt 222 lb (100.7 kg)    SpO2 99%    BMI 39.96 kg/m     Wt Readings from Last 3 Encounters:  03/07/20 222 lb (100.7 kg)  02/15/20 224 lb 9.6 oz (101.9 kg)  11/24/19 199 lb (90.3 kg)    GEN: Well nourished, well developed in no acute distress HEENT: Normal, moist mucous membranes NECK: No JVD CARDIAC: regular rhythm, normal S1 and S2, no rubs or gallops. 2/6 systolic murmur at LUSB. VASCULAR: Radial and DP pulses 2+ bilaterally. No carotid bruits RESPIRATORY:  Clear to auscultation without rales, wheezing or rhonchi    ABDOMEN: Soft, non-tender, non-distended MUSCULOSKELETAL:  Ambulates independently with a cane, knee brace on left knee SKIN: Warm and dry, no edema NEUROLOGIC:  Alert and oriented x 3. No focal neuro deficits noted. PSYCHIATRIC:  Normal affect    ASSESSMENT:    1. Hyperlipidemia due to type 2 diabetes mellitus ([place])   2. Incomplete left bundle branch block (LBBB)   3. Essential hypertension   4. Murmur, cardiac   5. Cardiac risk counseling   6. Counseling on health promotion and disease prevention   7. Class 2 severe obesity due to excess calories with serious comorbidity and body mass index (BMI) of 39.0 to 39.9 in adult [facility])    PLAN:    Abnormal EKG: pattern of iLBBB. Unchanged today -prior echo WNL -no syncope or high risk features -no further evaluation  Hypertension: improved today compared to prior -continue amlodipine 5 mg daily, quinipril 40 mg daily -avoid diuretics as she is on empagliflozin -counseled on home blood pressures  Hyperlipidemia due to type II diabetes -LFTs elevated on statins previously -tolerating rosuvastatin 10 mg day -last lipids 02/15/20 LDL 92, TG 98 -transaminases normal, mildly elevated alk phos on LFTs 01/2020.  Murmur:  -echo without significant valve abnormalities -no high risk features  Type II diabetes: -now on empagliflozin and jardiance -discussed recommendations for aspirin in diabetes. She will start 81 mg aspirin today.  Obesity, BMI 39 -discussed diet, limited exercise ability -she will continue to work on this  Cardiac risk counseling and prevention recommendations: -recommend heart healthy/Mediterranean diet, with whole grains, fruits, vegetable, fish, lean meats, nuts, and olive oil. Limit salt. -recommend moderate walking, 3-5 times/week for 30-50 minutes each session. Aim for at least 150 minutes.week. Goal should be pace of 3 miles/hours, or walking 1.5 miles in 30 minutes -recommend avoidance of tobacco products.  Avoid excess alcohol. -ASCVD risk score: The 10-year ASCVD risk score Mikey Bussing DC Jr., et al., 2013) is: 19%   Values used to calculate the score:     Age: 88 years     Sex: Female  Is Non-Hispanic African American: Yes     Diabetic: Yes     Tobacco smoker: No     Systolic Blood Pressure: 502 mmHg     Is BP treated: Yes     HDL Cholesterol: 62 mg/dL     Total Cholesterol: 173 mg/dL    Plan for follow up: 2 years or sooner PRN  Medication Adjustments/Labs and Tests Ordered: Current medicines are reviewed at length with the patient today.  Concerns regarding medicines are outlined above.  Orders Placed This Encounter  Procedures   EKG 12-Lead   Meds ordered this encounter  Medications   aspirin EC 81 MG tablet    Sig: Take 1 tablet (81 mg total) by mouth daily. Swallow whole.    Dispense:  90 tablet    Refill:  3    Patient Instructions  Medication Instructions:  Start Aspirin 81 mg daily  *If you need a refill on your cardiac medications before your next appointment, please call your pharmacy*   Lab Work: None ordered   Testing/Procedures: None ordered    Follow-Up: At [facility], you and your health needs are our priority.  As part of our continuing mission to provide you with exceptional heart care, we have created designated Provider Care Teams.  These Care Teams include your primary Cardiologist (physician) and Advanced Practice Providers (APPs -  Physician Assistants and Nurse Practitioners) who all work together to provide you with the care you need, when you need it.  We recommend signing up for the patient portal called "MyChart".  Sign up information is provided on this After Visit Summary.  MyChart is used to connect with patients for Virtual Visits (Telemedicine).  Patients are able to view lab/test results, encounter notes, upcoming appointments, etc.  Non-urgent messages can be sent to your provider as well.   To learn more about what you can do  with MyChart, go to [email or web address].    Your next appointment:   2 year(s)  The format for your next appointment:   In Person  Provider:   [name], MD       Signed, [name], MD PhD 03/07/2020 11:54 AM    [place]

## 2020-03-07 NOTE — Patient Instructions (Signed)
Medication Instructions:  Start Aspirin 81 mg daily  *If you need a refill on your cardiac medications before your next appointment, please call your pharmacy*   Lab Work: None ordered   Testing/Procedures: None ordered    Follow-Up: At [facility], you and your health needs are our priority.  As part of our continuing mission to provide you with exceptional heart care, we have created designated Provider Care Teams.  These Care Teams include your primary Cardiologist (physician) and Advanced Practice Providers (APPs -  Physician Assistants and Nurse Practitioners) who all work together to provide you with the care you need, when you need it.  We recommend signing up for the patient portal called "MyChart".  Sign up information is provided on this After Visit Summary.  MyChart is used to connect with patients for Virtual Visits (Telemedicine).  Patients are able to view lab/test results, encounter notes, upcoming appointments, etc.  Non-urgent messages can be sent to your provider as well.   To learn more about what you can do with MyChart, go to [email or web address].    Your next appointment:   2 year(s)  The format for your next appointment:   In Person  Provider:   [name], MD

## 2020-03-08 ENCOUNTER — Other Ambulatory Visit: Payer: Self-pay

## 2020-03-08 ENCOUNTER — Ambulatory Visit: Payer: Medicare Other | Admitting: Physical Therapy

## 2020-03-08 ENCOUNTER — Encounter: Payer: Self-pay | Admitting: Physical Therapy

## 2020-03-08 DIAGNOSIS — M79672 Pain in left foot: Secondary | ICD-10-CM

## 2020-03-08 DIAGNOSIS — M25562 Pain in left knee: Secondary | ICD-10-CM

## 2020-03-08 DIAGNOSIS — R2689 Other abnormalities of gait and mobility: Secondary | ICD-10-CM | POA: Diagnosis not present

## 2020-03-08 DIAGNOSIS — M25662 Stiffness of left knee, not elsewhere classified: Secondary | ICD-10-CM | POA: Diagnosis not present

## 2020-03-08 DIAGNOSIS — M545 Low back pain, unspecified: Secondary | ICD-10-CM

## 2020-03-08 DIAGNOSIS — G8929 Other chronic pain: Secondary | ICD-10-CM | POA: Diagnosis not present

## 2020-03-08 DIAGNOSIS — M6281 Muscle weakness (generalized): Secondary | ICD-10-CM

## 2020-03-08 NOTE — Therapy (Signed)
[place], [place], [address] Phone: [phone]   Fax:  [phone]  Physical Therapy Treatment / ERO  Patient Details  Name: Tabitha Oneal MRN: [id number] Date of Birth: [birth year] Referring Provider (PT): Landis Martins, [place]   Encounter Date: 03/08/2020   PT End of Session - 03/08/20 1010    Visit Number 13    Number of Visits 16    Date for PT Re-Evaluation 03/29/20    Authorization Type UHC MCR    Progress Note Due on Visit 20    PT Start Time 1002    PT Stop Time 1043    PT Time Calculation (min) 41 min    Activity Tolerance Patient tolerated treatment well    Behavior During Therapy [facility] for tasks assessed/performed           Past Medical History:  Diagnosis Date  . Asthma    QVAR daily and Albuterol as needed  . Bladder infection    taking Keflex daily   . Chronic back pain    reason unknown  . Diabetes mellitus    takes Metformin and Tradjenta daily  . GERD (gastroesophageal reflux disease)    takes Omeprazole daily  . Hard of hearing   . Headache    daily.Takes Excedrine daily  . Hepatitis C   . History of blood transfusion    no abnormal reaction noted  . Hyperlipidemia    takes Atorvastatin daily  . Hypertension    takes Quinapril daily  . Joint pain   . Joint swelling   . Nocturia   . Obesity   . Osteoarthritis of left knee 02/23/2012  . Seasonal allergies    takes Claritin and Singulair daily.Nasal spray as needed  . Shortness of breath dyspnea    with exertion    Past Surgical History:  Procedure Laterality Date  . ABDOMINAL HYSTERECTOMY    . EYE SURGERY  2021  . INJECTION KNEE  04/09/2012   Procedure: KNEE INJECTION;  Surgeon: Johnny Bridge, MD;  Location: [place];  Service: Orthopedics;  Laterality: Left;  . JOINT REPLACEMENT     acl   bil knees  . KNEE ARTHROTOMY Left 08/28/2015   Procedure: LEFT KNEE ARTHROFIBROSIS EXCISION;  pylectomy;  Surgeon: Meredith Pel, MD;  Location: [facility];  Service: Orthopedics;  Laterality: Left;  . KNEE CLOSED REDUCTION  04/09/2012   Procedure: CLOSED MANIPULATION KNEE;  Surgeon: Johnny Bridge, MD;  Location: [place];  Service: Orthopedics;  Laterality: Left;  Manipulation Knee with Anesthesia includes Application of Traction   . TOTAL KNEE ARTHROPLASTY  02/23/2012   Procedure: TOTAL KNEE ARTHROPLASTY;  Surgeon: Johnny Bridge, MD;  Location: [facility];  Service: Orthopedics;  Laterality: Left;    There were no vitals filed for this visit.   Subjective Assessment - 03/08/20 1007    Subjective Patient reports the inserts are still helping a lot when she is walking. She is using cane more often. She still has pain in the morning and night when she takes her shoes off.    How long can you walk comfortably? "Around 30 minutes with cane, maybe 2/10 pain"    Patient Stated Goals Get rid of pain so she can move better    Currently in Pain? Yes    Pain Score 0-No pain    Pain Location Foot    Pain Orientation Left    Pain Score 5  Pain Location Back    Pain Orientation Lower    Pain Descriptors / Indicators Aching    Pain Type Chronic pain    Pain Onset More than a month ago    Pain Frequency Constant    Pain Score 6    Pain Location Knee    Pain Orientation Left    Pain Descriptors / Indicators Aching    Pain Type Chronic pain    Pain Onset More than a month ago    Pain Frequency Intermittent              [facility] PT Assessment - 03/08/20 0001      Assessment   Medical Diagnosis Plantar fasciitis of left foot, Left knee pain, Low back pain     Referring Provider (PT) Landis Martins, DPM      Precautions   Precautions None      Restrictions   Weight Bearing Restrictions No      Balance Screen   Has the patient fallen in the past 6 months No      Prior Function   Level of Independence Independent with household mobility with device;Independent  with community mobility with device      Observation/Other Assessments   Focus on Therapeutic Outcomes (FOTO)  NA      AROM   Left Knee Extension -10    Left Knee Flexion 25    Left Ankle Dorsiflexion 10    Left Ankle Plantar Flexion 50    Left Ankle Inversion 35    Left Ankle Eversion 25      Strength   Right Hip Extension 4-/5    Right Hip ABduction 4-/5    Left Hip Extension 4-/5    Left Hip ABduction 4-/5    Left Ankle Dorsiflexion 5/5    Left Ankle Plantar Flexion 4+/5    Left Ankle Inversion 5/5    Left Ankle Eversion 5/5                         [facility] Adult PT Treatment/Exercise - 03/08/20 0001      Neuro Re-ed    Neuro Re-ed Details  Tandem stance 4 x 30 sec each, Split stance 4 x 30 sec each, Split stance weight shifts x 2 min each      Knee/Hip Exercises: Standing   Hip Abduction 3 sets;15 reps    Abduction Limitations green below knees    Gait Training SPC, proper sequence and step length, posture      Knee/Hip Exercises: Supine   Straight Leg Raises 2 sets;20 reps      Ankle Exercises: Supine   T-Band Inversion with red 2 x 20      Ankle Exercises: Standing   Heel Raises 20 reps   3 sets   Heel Raises Limitations feet on Airex      Ankle Exercises: Stretches   Slant Board Stretch 2 reps;30 seconds                  PT Education - 03/08/20 1009    Education Details HEP    Person(s) Educated Patient    Methods Explanation    Comprehension Verbalized understanding;Need further instruction            PT Short Term Goals - 03/08/20 1030      PT SHORT TERM GOAL #1   Title Patient will be I with initial HEP to progress with PT    Time  4    Period Weeks    Status Achieved    Target Date 02/13/20      PT SHORT TERM GOAL #2   Title Patient will exhibit >/= 5 deg left ankle dorsiflexion to improve gait    Baseline 10 deg    Time 4    Period Weeks    Status Achieved    Target Date 02/13/20      PT SHORT TERM GOAL #3    Title Patient will be able to ambulation household distances without CAM boot and </= 5/10 pain to improve mobility    Baseline Patient reports 5/10 pain, 2-3/10 with arch taping    Time 4    Period Weeks    Status Achieved    Target Date 02/13/20             PT Long Term Goals - 03/08/20 1027      PT LONG TERM GOAL #1   Title Patient will be I with final HEP to maintain progress from PT    Time 3    Period Weeks    Status On-going    Target Date 03/29/20      PT LONG TERM GOAL #2   Title Patient will exhibit >/= 45 deg knee flexion to improve transfer ability    Baseline Goal discharged    Status Deferred      PT LONG TERM GOAL #3   Title Patient will exhibit left ankle strength grossly 5/5 MMT to improve walking tolerance and arch control    Baseline 4+/5 plantarflexion    Time 3    Period Weeks    Status On-going    Target Date 03/29/20      PT LONG TERM GOAL #4   Title Patient will be able to ambulate community level distance with LRAD and </= 5/10 pain level to improve grocery shopping    Baseline Patient reports < 2/10 with community level ambulation using cane    Time 8    Period Weeks    Status Achieved      PT LONG TERM GOAL #5   Title Patient will report improve functional level of </= 42% limitation of FOTO    Baseline FOTO discharged    Status Deferred                 Plan - 03/08/20 1012    Clinical Impression Statement Patient tolerated therapy well with no adverse effects. She is progressing well toward her established goals and reports continued improved foot pain with inserts. She continues to demonstrate significant limitation with knee motion and this is unlikely to improve with therapy so goal discontinued. She has 2 more visits scheduled so will plan continue therapy for those visits and discharge at that time. She would benefit from continued skilled PT to progress her strength and motion in order to improve her walking and maximize  functional mobility.    PT Frequency 1x / week    PT Duration 3 weeks    PT Treatment/Interventions ADLs/Self Care Home Management;Aquatic Therapy;Cryotherapy;Electrical Stimulation;Iontophoresis 4mg /ml Dexamethasone;Moist Heat;Neuromuscular re-education;Balance training;Therapeutic exercise;Therapeutic activities;Functional mobility training;Stair training;Gait training;Patient/family education;Manual techniques;Dry needling;Passive range of motion;Taping;Joint Manipulations    PT Next Visit Plan Assess HEP and progress PRN, progress left calf flexibility and ankle strength, manual and stretching for left knee range of motion as able    PT Home Exercise Plan [id number]: seated banded inversion with yellow, seated towel scrunch, seated plantar fascia mobilization with ball, standing heel  raises, SLR, standing hip abduction and extension with green, seated lumbar flexion and rotation stretch, standing calf and great toe stretching, tandem balance at counter    Consulted and Agree with Plan of Care Patient           Patient will benefit from skilled therapeutic intervention in order to improve the following deficits and impairments:  Abnormal gait, Decreased range of motion, Difficulty walking, Pain, Decreased activity tolerance, Decreased balance, Improper body mechanics, Decreased strength, Decreased mobility, Impaired flexibility  Visit Diagnosis: Pain in left foot  Chronic pain of left knee  Stiffness of left knee, not elsewhere classified  Chronic bilateral low back pain, unspecified whether sciatica present  Muscle weakness (generalized)  Other abnormalities of gait and mobility     Problem List Patient Active Problem List   Diagnosis Date Noted  . Pain in left knee 09/13/2019  . 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 Abnormal EKG 03/02/2019  . Incomplete left bundle branch block (LBBB) 03/02/2019  . Rebound headache 12/13/2018  . Overuse of  medication 12/13/2018  . Intractable chronic migraine without aura and without status migrainosus 12/13/2018  . Microcytic anemia 02/24/2018  . High risk medication use 08/06/2017  . Seasonal and perennial allergic rhinitis 07/30/2017  . Recurrent infections 07/30/2017  . Cough 06/08/2017  . Moderate persistent asthma without complication 29/19/1660  . Chronic constipation 02/04/2017  . Hyperlipidemia due to type 2 diabetes mellitus ([place]) 02/04/2017  . Hallux rigidus, left foot 01/01/2017  . Postmenopausal symptoms 02/22/2016  . Arthrofibrosis of knee joint 08/28/2015  . Left hip pain 10/08/2014  . Type 2 diabetes mellitus without complication, without long-term current use of insulin ([place]) 10/08/2014  . Hyperlipidemia LDL goal <100 10/08/2014  . Essential hypertension, benign 08/30/2014  . Diabetes mellitus, type 2 ([place]) 08/30/2014  . Diverticulosis of colon without hemorrhage 08/30/2014  . Chronic low back pain 08/30/2014  . Chronic pain syndrome 08/30/2014  . Asthma in adult without complication 60/08/5995  . 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 Osteoarthritis of left knee 02/23/2012    Hilda Blades, PT, DPT, LAT, ATC 03/08/20  10:49 AM Phone: [phone] Fax: [place] [facility] [address] [place], [place], [address] Phone: [phone]   Fax:  [phone]  Name: Tabitha Oneal MRN: [id number] Date of Birth: Mar 22, 1956

## 2020-03-08 NOTE — Patient Instructions (Signed)
Access Code: [email or web address] URL: [email or web address] Date: 03/08/2020 Prepared by: Hilda Blades  Exercises Active Straight Leg Raise with Quad Set - 1 x daily - 3 x weekly - 3 sets - 20 reps Seated Ankle Inversion with Resistance and Legs Crossed - 1 x daily - 3 x weekly - 3 sets - 20 reps Seated Toe Towel Scrunches - 1 x daily - 3 x weekly - 3 sets - 20 reps Seated Plantar Fascia Mobilization with Small Ball - 1 x daily - 7 x weekly - 2-3 minutes hold Seated Flexion Stretch with Swiss Ball - 2 x daily - 7 x weekly - 10 reps - 5 seconds hold Seated Trunk Rotation - Arms Crossed - 2 x daily - 7 x weekly - 10 reps - 5 seconds hold Standing Hip Abduction with Resistance at Ankles and Counter Support - 1 x daily - 3 x weekly - 3 sets - 20 reps Standing Hip Extension with Resistance at Ankles and Counter Support - 1 x daily - 3 x weekly - 3 sets - 20 reps Heel rises with counter support - 1 x daily - 3 x weekly - 3 sets - 20 reps Standing Gastroc Stretch at Counter - 1 x daily - 7 x weekly - 3 reps - 30 seconds hold Standing Toe Dorsiflexion Stretch - 1 x daily - 7 x weekly - 2 reps - 30 seconds hold Standing Ankle Dorsiflexor Toe Extensor Stretch - 1 x daily - 7 x weekly - 2 reps - 30 seconds hold Standing Tandem Balance with Counter Support - 1 x daily - 7 x weekly - 3 reps - 30 seconds hold

## 2020-03-12 ENCOUNTER — Other Ambulatory Visit: Payer: Self-pay | Admitting: Allergy & Immunology

## 2020-03-12 DIAGNOSIS — H26493 Other secondary cataract, bilateral: Secondary | ICD-10-CM | POA: Diagnosis not present

## 2020-03-13 ENCOUNTER — Telehealth: Payer: Self-pay | Admitting: *Deleted

## 2020-03-13 NOTE — Telephone Encounter (Signed)
Patient stated that she is having no discharge. Patient will try Monostat.

## 2020-03-13 NOTE — Telephone Encounter (Signed)
Patient called and stated that since starting the Metformin 1000mg  she is having side effect of Vaginal Itching.  Patient is wanting to know if she should continue the medication.  Please Advise.

## 2020-03-13 NOTE — Telephone Encounter (Signed)
She may actually have a yeast infection due to her sugars being elevated, is she is having any abnormal vaginal discharge? Can use OTC monostat (there are different doses) she can use the 1 time dose or 3 day dose to see if this improves symptoms.

## 2020-03-16 ENCOUNTER — Ambulatory Visit: Payer: Medicare Other | Admitting: Physical Therapy

## 2020-03-16 ENCOUNTER — Encounter: Payer: Self-pay | Admitting: Physical Therapy

## 2020-03-16 ENCOUNTER — Other Ambulatory Visit: Payer: Self-pay

## 2020-03-16 DIAGNOSIS — M25662 Stiffness of left knee, not elsewhere classified: Secondary | ICD-10-CM

## 2020-03-16 DIAGNOSIS — M25562 Pain in left knee: Secondary | ICD-10-CM | POA: Diagnosis not present

## 2020-03-16 DIAGNOSIS — G8929 Other chronic pain: Secondary | ICD-10-CM

## 2020-03-16 DIAGNOSIS — R2689 Other abnormalities of gait and mobility: Secondary | ICD-10-CM | POA: Diagnosis not present

## 2020-03-16 DIAGNOSIS — M79672 Pain in left foot: Secondary | ICD-10-CM | POA: Diagnosis not present

## 2020-03-16 DIAGNOSIS — M545 Low back pain, unspecified: Secondary | ICD-10-CM | POA: Diagnosis not present

## 2020-03-16 DIAGNOSIS — M6281 Muscle weakness (generalized): Secondary | ICD-10-CM | POA: Diagnosis not present

## 2020-03-16 NOTE — Therapy (Signed)
[facility] [place], [place], [address] Phone: [phone]   Fax:  [phone]  Physical Therapy Treatment  Patient Details  Name: Tabitha Oneal MRN: [id number] Date of Birth: 1956-05-15 Referring Provider (PT): Landis Martins, [place]   Encounter Date: 03/16/2020   PT End of Session - 03/16/20 1211    Visit Number 14    Number of Visits 16    Date for PT Re-Evaluation 03/29/20    Authorization Type UHC MCR    Progress Note Due on Visit 20    PT Start Time 1208    PT Stop Time 1250    PT Time Calculation (min) 42 min    Activity Tolerance Patient tolerated treatment well    Behavior During Therapy [facility] for tasks assessed/performed           Past Medical History:  Diagnosis Date  . Asthma    QVAR daily and Albuterol as needed  . Bladder infection    taking Keflex daily   . Chronic back pain    reason unknown  . Diabetes mellitus    takes Metformin and Tradjenta daily  . GERD (gastroesophageal reflux disease)    takes Omeprazole daily  . Hard of hearing   . Headache    daily.Takes Excedrine daily  . Hepatitis C   . History of blood transfusion    no abnormal reaction noted  . Hyperlipidemia    takes Atorvastatin daily  . Hypertension    takes Quinapril daily  . Joint pain   . Joint swelling   . Nocturia   . Obesity   . Osteoarthritis of left knee 02/23/2012  . Seasonal allergies    takes Claritin and Singulair daily.Nasal spray as needed  . Shortness of breath dyspnea    with exertion    Past Surgical History:  Procedure Laterality Date  . ABDOMINAL HYSTERECTOMY    . EYE SURGERY  2021  . INJECTION KNEE  04/09/2012   Procedure: KNEE INJECTION;  Surgeon: Johnny Bridge, MD;  Location: [facility];  Service: Orthopedics;  Laterality: Left;  . JOINT REPLACEMENT     acl   bil knees  . KNEE ARTHROTOMY Left 08/28/2015   Procedure: LEFT KNEE ARTHROFIBROSIS EXCISION;  pylectomy;  Surgeon: Meredith Pel, MD;  Location: [facility];  Service: Orthopedics;  Laterality: Left;  . KNEE CLOSED REDUCTION  04/09/2012   Procedure: CLOSED MANIPULATION KNEE;  Surgeon: Johnny Bridge, MD;  Location: [facility];  Service: Orthopedics;  Laterality: Left;  Manipulation Knee with Anesthesia includes Application of Traction   . TOTAL KNEE ARTHROPLASTY  02/23/2012   Procedure: TOTAL KNEE ARTHROPLASTY;  Surgeon: Johnny Bridge, MD;  Location: [facility];  Service: Orthopedics;  Laterality: Left;    There were no vitals filed for this visit.   Subjective Assessment - 03/16/20 1209    Subjective Patient reports she continues to do well with the inserts. Exercises are going well.    Patient Stated Goals Get rid of pain so she can move better    Currently in Pain? Yes    Pain Score 5    7/10 without shoes   Pain Location Foot    Pain Orientation Left    Pain Descriptors / Indicators Aching;Tightness    Pain Type Chronic pain    Pain Onset More than a month ago    Pain Frequency Intermittent    Aggravating Factors  Walking  [place] Adult PT Treatment/Exercise - 03/16/20 0001      Neuro Re-ed    Neuro Re-ed Details  Tandem stance 4 x 30 sec each, Split stance 4 x 30 sec each      Exercises   Exercises Knee/Hip;Ankle      Knee/Hip Exercises: Stretches   Other Knee/Hip Stretches Seated lumbar flexion using physioball 5 x 10 sec fwd and lateral      Knee/Hip Exercises: Standing   Hip Abduction 2 sets;10 reps    Abduction Limitations green below knees    Hip Extension 2 sets;10 reps    Extension Limitations green below knees    Gait Training SPC, proper sequence and step length, posture      Knee/Hip Exercises: Supine   Bridges 2 sets;10 reps    Bridges Limitations legs straight with feet elevated on physioball    Straight Leg Raises 2 sets;20 reps      Ankle Exercises: Stretches   Slant Board Stretch 3  reps;30 seconds      Ankle Exercises: Standing   Heel Raises 20 reps   3 sets     Ankle Exercises: Supine   T-Band Inversion with red 2 x 20                  PT Education - 03/16/20 1211    Education Details HEP    Person(s) Educated Patient    Methods Explanation    Comprehension Verbalized understanding;Need further instruction            PT Short Term Goals - 03/08/20 1030      PT SHORT TERM GOAL #1   Title Patient will be I with initial HEP to progress with PT    Time 4    Period Weeks    Status Achieved    Target Date 02/13/20      PT SHORT TERM GOAL #2   Title Patient will exhibit >/= 5 deg left ankle dorsiflexion to improve gait    Baseline 10 deg    Time 4    Period Weeks    Status Achieved    Target Date 02/13/20      PT SHORT TERM GOAL #3   Title Patient will be able to ambulation household distances without CAM boot and </= 5/10 pain to improve mobility    Baseline Patient reports 5/10 pain, 2-3/10 with arch taping    Time 4    Period Weeks    Status Achieved    Target Date 02/13/20             PT Long Term Goals - 03/08/20 1027      PT LONG TERM GOAL #1   Title Patient will be I with final HEP to maintain progress from PT    Time 3    Period Weeks    Status On-going    Target Date 03/29/20      PT LONG TERM GOAL #2   Title Patient will exhibit >/= 45 deg knee flexion to improve transfer ability    Baseline Goal discharged    Status Deferred      PT LONG TERM GOAL #3   Title Patient will exhibit left ankle strength grossly 5/5 MMT to improve walking tolerance and arch control    Baseline 4+/5 plantarflexion    Time 3    Period Weeks    Status On-going    Target Date 03/29/20      PT LONG TERM GOAL #4  Title Patient will be able to ambulate community level distance with LRAD and </= 5/10 pain level to improve grocery shopping    Baseline Patient reports < 2/10 with community level ambulation using cane    Time 8    Period  Weeks    Status Achieved      PT LONG TERM GOAL #5   Title Patient will report improve functional level of </= 42% limitation of FOTO    Baseline FOTO discharged    Status Deferred                 Plan - 03/16/20 1212    Clinical Impression Statement Patient tolerated therapy well with no adverse effects. Continued focus on progressing ankle and BLE strength, balance, and walking ability. She reports continuing to do well with her inserts in regard to foot pain. Balance has greatly improved only requiring occasional UE assist, and she is progressing well with gait using cane. Will plan for next visit to be discharge so will finalize HEP to ensure indepdence. She would benefit from continued skilled PT to progress her strength and motion in order to improve her walking and maximize functional mobility.    PT Treatment/Interventions ADLs/Self Care Home Management;Aquatic Therapy;Cryotherapy;Electrical Stimulation;Iontophoresis 4mg /ml Dexamethasone;Moist Heat;Neuromuscular re-education;Balance training;Therapeutic exercise;Therapeutic activities;Functional mobility training;Stair training;Gait training;Patient/family education;Manual techniques;Dry needling;Passive range of motion;Taping;Joint Manipulations    PT Next Visit Plan Assess HEP and progress PRN, progress left calf flexibility and ankle strength, manual and stretching for left knee range of motion as able    PT Home Exercise Plan [email or web address]: seated banded inversion with yellow, seated towel scrunch, seated plantar fascia mobilization with ball, standing heel raises, SLR, standing hip abduction and extension with green, seated lumbar flexion and rotation stretch, standing calf and great toe stretching, tandem balance at counter    Consulted and Agree with Plan of Care Patient           Patient will benefit from skilled therapeutic intervention in order to improve the following deficits and impairments:  Abnormal gait, Decreased range  of motion, Difficulty walking, Pain, Decreased activity tolerance, Decreased balance, Improper body mechanics, Decreased strength, Decreased mobility, Impaired flexibility  Visit Diagnosis: Pain in left foot  Chronic pain of left knee  Stiffness of left knee, not elsewhere classified  Chronic bilateral low back pain, unspecified whether sciatica present  Muscle weakness (generalized)  Other abnormalities of gait and mobility     Problem List Patient Active Problem List   Diagnosis Date Noted  . Pain in left knee 09/13/2019  . 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 Abnormal EKG 03/02/2019  . Incomplete left bundle branch block (LBBB) 03/02/2019  . Rebound headache 12/13/2018  . Overuse of medication 12/13/2018  . Intractable chronic migraine without aura and without status migrainosus 12/13/2018  . Microcytic anemia 02/24/2018  . High risk medication use 08/06/2017  . Seasonal and perennial allergic rhinitis 07/30/2017  . Recurrent infections 07/30/2017  . Cough 06/08/2017  . Moderate persistent asthma without complication 78/24/2353  . Chronic constipation 02/04/2017  . Hyperlipidemia due to type 2 diabetes mellitus ([place]) 02/04/2017  . Hallux rigidus, left foot 01/01/2017  . Postmenopausal symptoms 02/22/2016  . Arthrofibrosis of knee joint 08/28/2015  . Left hip pain 10/08/2014  . Type 2 diabetes mellitus without complication, without long-term current use of insulin ([place]) 10/08/2014  . Hyperlipidemia LDL goal <100 10/08/2014  . Essential hypertension, benign 08/30/2014  . Diabetes mellitus, type 2 ([place]) 08/30/2014  .  Diverticulosis of colon without hemorrhage 08/30/2014  . Chronic low back pain 08/30/2014  . Chronic pain syndrome 08/30/2014  . Asthma in adult without complication 52/77/8242  . 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 Osteoarthritis of left knee 02/23/2012    Hilda Blades, PT, DPT, LAT,  ATC 03/16/20  12:50 PM Phone: [phone] Fax: [facility] [facility] [address] [place], [place], [address] Phone: [phone]   Fax:  [phone]  Name: Tabitha Oneal MRN: [id number] Date of Birth: March 23, 1956

## 2020-03-23 ENCOUNTER — Other Ambulatory Visit: Payer: Self-pay

## 2020-03-23 ENCOUNTER — Encounter: Payer: Self-pay | Admitting: Physical Therapy

## 2020-03-23 ENCOUNTER — Ambulatory Visit: Payer: Medicare Other | Admitting: Physical Therapy

## 2020-03-23 DIAGNOSIS — M25662 Stiffness of left knee, not elsewhere classified: Secondary | ICD-10-CM

## 2020-03-23 DIAGNOSIS — M79672 Pain in left foot: Secondary | ICD-10-CM | POA: Diagnosis not present

## 2020-03-23 DIAGNOSIS — M6281 Muscle weakness (generalized): Secondary | ICD-10-CM

## 2020-03-23 DIAGNOSIS — R2689 Other abnormalities of gait and mobility: Secondary | ICD-10-CM

## 2020-03-23 DIAGNOSIS — G8929 Other chronic pain: Secondary | ICD-10-CM

## 2020-03-23 DIAGNOSIS — M25562 Pain in left knee: Secondary | ICD-10-CM | POA: Diagnosis not present

## 2020-03-23 DIAGNOSIS — M545 Low back pain, unspecified: Secondary | ICD-10-CM | POA: Diagnosis not present

## 2020-03-23 NOTE — Therapy (Signed)
[place], [place], [address] Phone: [phone]   Fax:  [phone]  Physical Therapy Treatment / Discharge  Patient Details  Name: Tabitha Oneal MRN: [id number] Date of Birth: May 30, 1955 Referring Provider (PT): Landis Martins, [place]   Encounter Date: 03/23/2020   PT End of Session - 03/23/20 1215    Visit Number 15    Number of Visits 16    Date for PT Re-Evaluation 03/29/20    Authorization Type [employer] MCR    PT Start Time 1208    PT Stop Time 1255    PT Time Calculation (min) 47 min    Activity Tolerance Patient tolerated treatment well    Behavior During Therapy [facility] for tasks assessed/performed           Past Medical History:  Diagnosis Date  . Asthma    QVAR daily and Albuterol as needed  . Bladder infection    taking Keflex daily   . Chronic back pain    reason unknown  . Diabetes mellitus    takes Metformin and Tradjenta daily  . GERD (gastroesophageal reflux disease)    takes Omeprazole daily  . Hard of hearing   . Headache    daily.Takes Excedrine daily  . Hepatitis C   . History of blood transfusion    no abnormal reaction noted  . Hyperlipidemia    takes Atorvastatin daily  . Hypertension    takes Quinapril daily  . Joint pain   . Joint swelling   . Nocturia   . Obesity   . Osteoarthritis of left knee 02/23/2012  . Seasonal allergies    takes Claritin and Singulair daily.Nasal spray as needed  . Shortness of breath dyspnea    with exertion    Past Surgical History:  Procedure Laterality Date  . ABDOMINAL HYSTERECTOMY    . EYE SURGERY  2021  . INJECTION KNEE  04/09/2012   Procedure: KNEE INJECTION;  Surgeon: Johnny Bridge, MD;  Location: [place];  Service: Orthopedics;  Laterality: Left;  . JOINT REPLACEMENT     acl   bil knees  . KNEE ARTHROTOMY Left 08/28/2015   Procedure: LEFT KNEE ARTHROFIBROSIS EXCISION; pylectomy;  Surgeon: Meredith Pel, MD;  Location: [facility];  Service: Orthopedics;  Laterality: Left;  . KNEE CLOSED REDUCTION  04/09/2012   Procedure: CLOSED MANIPULATION KNEE;  Surgeon: Johnny Bridge, MD;  Location: [facility];  Service: Orthopedics;  Laterality: Left;  Manipulation Knee with Anesthesia includes Application of Traction   . TOTAL KNEE ARTHROPLASTY  02/23/2012   Procedure: TOTAL KNEE ARTHROPLASTY;  Surgeon: Johnny Bridge, MD;  Location: [facility];  Service: Orthopedics;  Laterality: Left;    There were no vitals filed for this visit.   Subjective Assessment - 03/23/20 1210    Subjective Patient reports she is doing well. When she wears her shoes her feet do feel better because of the inserts but states that without her shoes she continues to have pain. Her knee continues to bother her and her back has improved but she does continue to have pain. She is consistent with her exercises.    How long can you sit comfortably? No limitation    How long can you stand comfortably? "Maybe a few minutes"    How long can you walk comfortably? "Around 30 minutes with cane, maybe 2/10 pain"    Patient Stated Goals Get rid of pain so she  can move better    Currently in Pain? Yes    Pain Score 3    7/10 without shoes   Pain Location Foot    Pain Orientation Left    Pain Descriptors / Indicators Aching;Tightness    Pain Type Chronic pain    Pain Onset More than a month ago    Pain Frequency Intermittent    Aggravating Factors  Standing, walking    Pain Relieving Factors Rest    Effect of Pain on Daily Activities Patient is limited in her walking ability    Pain Score 5    Pain Location Back    Pain Orientation Lower    Pain Descriptors / Indicators Aching    Pain Type Chronic pain    Pain Onset More than a month ago    Pain Frequency Constant    Aggravating Factors  Bending, standing, walking    Pain Relieving Factors Rest    Pain Score 6    Pain Location Knee    Pain Orientation Left     Pain Descriptors / Indicators Aching    Pain Type Chronic pain    Pain Onset More than a month ago    Pain Frequency Constant    Aggravating Factors  Walking, standing    Pain Relieving Factors Rest              [facility] PT Assessment - 03/23/20 0001      Assessment   Medical Diagnosis Plantar fasciitis of left foot, Left knee pain, Low back pain     Referring Provider (PT) Landis Martins, DPM      Precautions   Precautions None      Restrictions   Weight Bearing Restrictions No      Balance Screen   Has the patient fallen in the past 6 months No    Has the patient had a decrease in activity level because of a fear of falling?  No    Is the patient reluctant to leave their home because of a fear of falling?  No      Prior Function   Level of Independence Independent with household mobility with device;Independent with community mobility with device      Cognition   Overall Cognitive Status Within Functional Limits for tasks assessed      Observation/Other Assessments   Focus on Therapeutic Outcomes (FOTO)  NA      Single Leg Stance   Comments Unable to perform, Able to hold tandem stance with occasional UE support      AROM   Left Knee Extension -10    Left Knee Flexion 25    Left Ankle Dorsiflexion 10    Left Ankle Plantar Flexion 50    Left Ankle Inversion 35    Left Ankle Eversion 25      Strength   Right Hip Flexion 4/5    Right Hip Extension 4-/5    Right Hip ABduction 4-/5    Left Hip Flexion 4-/5    Left Hip Extension 4-/5    Left Hip ABduction 4-/5    Left Ankle Dorsiflexion 5/5    Left Ankle Plantar Flexion 4+/5    Left Ankle Inversion 5/5    Left Ankle Eversion 5/5      Ambulation/Gait   Gait Comments Using rollator, no knee flexion with gait on left, she does exhibit good heel-toe progression bilaterally  [facility] Adult PT Treatment/Exercise - 03/23/20 0001      Self-Care   Self-Care Other Self-Care  Comments    Other Self-Care Comments  Instruction and demonstration for how to get up from the floor using a surface and without any support in middle of floor      Neuro Re-ed    Neuro Re-ed Details  Tandem stance 3 x 30 sec each      Exercises   Exercises Knee/Hip;Ankle      Knee/Hip Exercises: Stretches   Other Knee/Hip Stretches Seated lumbar flexion using rollator 5 x 5 sec fwd and lateral      Knee/Hip Exercises: Standing   Hip Abduction 2 sets;15 reps    Abduction Limitations green band    Hip Extension 2 sets;15 reps    Extension Limitations green band      Knee/Hip Exercises: Supine   Straight Leg Raises 2 sets;20 reps      Knee/Hip Exercises: Sidelying   Hip ABduction 2 sets;20 reps      Ankle Exercises: Stretches   Gastroc Stretch 30 seconds    Gastroc Stretch Limitations standing at Garment/textile technologist 3 reps;30 seconds      Ankle Exercises: Standing   Heel Raises 20 reps   2 sets     Ankle Exercises: Seated   Other Seated Ankle Exercises Banded ankle red x20 each                  PT Education - 03/23/20 1214    Education Details HEP finalization and consistency, discharge planning and follow-up instructions    Person(s) Educated Patient    Methods Explanation    Comprehension Verbalized understanding            PT Short Term Goals - 03/08/20 1030      PT SHORT TERM GOAL #1   Title Patient will be I with initial HEP to progress with PT    Time 4    Period Weeks    Status Achieved    Target Date 02/13/20      PT SHORT TERM GOAL #2   Title Patient will exhibit >/= 5 deg left ankle dorsiflexion to improve gait    Baseline 10 deg    Time 4    Period Weeks    Status Achieved    Target Date 02/13/20      PT SHORT TERM GOAL #3   Title Patient will be able to ambulation household distances without CAM boot and </= 5/10 pain to improve mobility    Baseline Patient reports 5/10 pain, 2-3/10 with arch taping    Time 4    Period  Weeks    Status Achieved    Target Date 02/13/20             PT Long Term Goals - 03/23/20 1224      PT LONG TERM GOAL #1   Title Patient will be I with final HEP to maintain progress from PT    Time 3    Period Weeks    Status Achieved      PT LONG TERM GOAL #2   Title Patient will exhibit >/= 45 deg knee flexion to improve transfer ability    Baseline Goal discharged    Status Deferred      PT LONG TERM GOAL #3   Title Patient will exhibit left ankle strength grossly 5/5 MMT to improve walking tolerance and arch control  Baseline 4+/5 plantarflexion    Time 3    Period Weeks    Status Partially Met      PT LONG TERM GOAL #4   Title Patient will be able to ambulate community level distance with LRAD and </= 5/10 pain level to improve grocery shopping    Baseline Patient reports < 2/10 with community level ambulation using cane    Time 8    Period Weeks    Status Achieved      PT LONG TERM GOAL #5   Title Patient will report improve functional level of </= 42% limitation of FOTO    Baseline FOTO discharged    Status Deferred                 Plan - 03/23/20 1216    Clinical Impression Statement Patient tolerated therapy well with no adverse effects. Patient has improved with therapy since evaluation, but continues to report pain with mostly weight bearing tasks. Her foot pain has imrpoved with the inserts and she demonstrates improved strength and motion and strength of her ankle. Her knee motion and pain level has not improved and is unlikely to improve with PT based on chronicity and lack of improvment of time. Her back pain has overall improved but she does continue to report pain with activity. Patient continues to walk majority of time with rollator and she does exhibit better stability with this compared to using a cane, but she has imrpoved with gait while using cane. The patient today demonstrated independence with her HEP. She will be discharged from PT to  independent HEP due to lack of overall progress and she was encouraged to follow-up with her referring provider if needed.    PT Treatment/Interventions ADLs/Self Care Home Management;Aquatic Therapy;Cryotherapy;Electrical Stimulation;Iontophoresis 49m/ml Dexamethasone;Moist Heat;Neuromuscular re-education;Balance training;Therapeutic exercise;Therapeutic activities;Functional mobility training;Stair training;Gait training;Patient/family education;Manual techniques;Dry needling;Passive range of motion;Taping;Joint Manipulations    PT Next Visit Plan NA - discharge    PT Home Exercise Plan [email or web address]    Consulted and Agree with Plan of Care Patient           Patient will benefit from skilled therapeutic intervention in order to improve the following deficits and impairments:  Abnormal gait, Decreased range of motion, Difficulty walking, Pain, Decreased activity tolerance, Decreased balance, Improper body mechanics, Decreased strength, Decreased mobility, Impaired flexibility  Visit Diagnosis: Pain in left foot  Chronic pain of left knee  Stiffness of left knee, not elsewhere classified  Chronic bilateral low back pain, unspecified whether sciatica present  Muscle weakness (generalized)  Other abnormalities of gait and mobility     Problem List Patient Active Problem List   Diagnosis Date Noted  . Pain in left knee 09/13/2019  . 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 Abnormal EKG 03/02/2019  . Incomplete left bundle branch block (LBBB) 03/02/2019  . Rebound headache 12/13/2018  . Overuse of medication 12/13/2018  . Intractable chronic migraine without aura and without status migrainosus 12/13/2018  . Microcytic anemia 02/24/2018  . High risk medication use 08/06/2017  . Seasonal and perennial allergic rhinitis 07/30/2017  . Recurrent infections 07/30/2017  . Cough 06/08/2017  . Moderate persistent asthma without complication [id number] . Chronic  constipation 02/04/2017  . Hyperlipidemia due to type 2 diabetes mellitus ([place] 02/04/2017  . Hallux rigidus, left foot 01/01/2017  . Postmenopausal symptoms 02/22/2016  . Arthrofibrosis of knee joint 08/28/2015  . Left hip pain 10/08/2014  . Type 2 diabetes mellitus without  complication, without long-term current use of insulin ([place]) 10/08/2014  . Hyperlipidemia LDL goal <100 10/08/2014  . Essential hypertension, benign 08/30/2014  . Diabetes mellitus, type 2 ([place]) 08/30/2014  . Diverticulosis of colon without hemorrhage 08/30/2014  . Chronic low back pain 08/30/2014  . Chronic pain syndrome 08/30/2014  . Asthma in adult without complication 30/17/2091  . 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 Osteoarthritis of left knee 02/23/2012    PHYSICAL THERAPY DISCHARGE SUMMARY  Visits from Start of Care: 15  Current functional level related to goals / functional outcomes: See above   Remaining deficits: See above   Education / Equipment: HEP Plan: Patient agrees to discharge.  Patient goals were partially met.                                                  lack of progress.  ?????     Hilda Blades, PT, DPT, LAT, ATC 03/23/20  12:59 PM Phone: [phone] Fax: [facility] [address] [place], [place], [address] Phone: [phone]   Fax:  [phone]  Name: Tabitha Oneal MRN: [id number] Date of Birth: [birth year]

## 2020-03-23 NOTE — Patient Instructions (Signed)
Access Code: [email or web address] URL: [email or web address] Date: 03/23/2020 Prepared by: Hilda Blades  Exercises Active Straight Leg Raise with Quad Set - 1 x daily - 3 x weekly - 3 sets - 20 reps Sidelying Hip Abduction - 1 x daily - 3 x weekly - 3 sets - 20 reps Seated Ankle Inversion with Resistance and Legs Crossed - 1 x daily - 3 x weekly - 3 sets - 20 reps Seated Toe Towel Scrunches - 1 x daily - 3 x weekly - 3 sets - 20 reps Seated Plantar Fascia Mobilization with Small Ball - 1 x daily - 3 x weekly - 2-3 minutes hold Seated Flexion Stretch with Swiss Ball - 1 x daily - 3 x weekly - 10 reps - 5 seconds hold Seated Thoracic Flexion and Rotation with Swiss Ball - 1 x daily - 3 x weekly - 5 reps - 5 seconds hold Standing Hip Abduction with Resistance at Ankles and Counter Support - 1 x daily - 3 x weekly - 3 sets - 20 reps Standing Hip Extension with Resistance at Ankles and Counter Support - 1 x daily - 3 x weekly - 3 sets - 20 reps Heel rises with counter support - 1 x daily - 3 x weekly - 3 sets - 20 reps Standing Gastroc Stretch at Counter - 1 x daily - 7 x weekly - 3 reps - 30 seconds hold Standing Tandem Balance with Counter Support - 1 x daily - 7 x weekly - 3 reps - 30 seconds hold

## 2020-04-02 DIAGNOSIS — G4733 Obstructive sleep apnea (adult) (pediatric): Secondary | ICD-10-CM | POA: Diagnosis not present

## 2020-04-04 ENCOUNTER — Telehealth: Payer: Self-pay

## 2020-04-04 DIAGNOSIS — G4733 Obstructive sleep apnea (adult) (pediatric): Secondary | ICD-10-CM | POA: Diagnosis not present

## 2020-04-04 MED ORDER — FERROUS SULFATE 325 (65 FE) MG PO TABS
ORAL_TABLET | ORAL | 1 refills | Status: DC
Start: 2020-04-04 — End: 2020-09-25

## 2020-04-04 NOTE — Telephone Encounter (Signed)
Patient called wanting refill

## 2020-04-12 ENCOUNTER — Other Ambulatory Visit: Payer: Self-pay

## 2020-04-12 ENCOUNTER — Ambulatory Visit
Admission: RE | Admit: 2020-04-12 | Discharge: 2020-04-12 | Disposition: A | Discharge status: Home / Self Care | Payer: Medicare Other | Source: Ambulatory Visit | Attending: Student | Admitting: Student

## 2020-04-12 DIAGNOSIS — Z780 Asymptomatic menopausal state: Secondary | ICD-10-CM

## 2020-04-12 DIAGNOSIS — M8589 Other specified disorders of bone density and structure, multiple sites: Secondary | ICD-10-CM | POA: Diagnosis not present

## 2020-04-23 ENCOUNTER — Other Ambulatory Visit: Payer: Self-pay | Admitting: Allergy & Immunology

## 2020-05-04 DIAGNOSIS — G4733 Obstructive sleep apnea (adult) (pediatric): Secondary | ICD-10-CM | POA: Diagnosis not present

## 2020-05-11 ENCOUNTER — Other Ambulatory Visit: Payer: Self-pay

## 2020-05-11 ENCOUNTER — Ambulatory Visit (INDEPENDENT_AMBULATORY_CARE_PROVIDER_SITE_OTHER): Payer: Medicare Other | Admitting: Nurse Practitioner

## 2020-05-11 ENCOUNTER — Encounter: Payer: Self-pay | Admitting: Nurse Practitioner

## 2020-05-11 ENCOUNTER — Other Ambulatory Visit: Payer: Self-pay | Admitting: Allergy & Immunology

## 2020-05-11 VITALS — BP 122/80 | HR 80 | Temp 97.0°F | Ht 62.5 in | Wt 211.4 lb

## 2020-05-11 DIAGNOSIS — E1169 Type 2 diabetes mellitus with other specified complication: Secondary | ICD-10-CM | POA: Diagnosis not present

## 2020-05-11 DIAGNOSIS — E785 Hyperlipidemia, unspecified: Secondary | ICD-10-CM

## 2020-05-11 DIAGNOSIS — Z23 Encounter for immunization: Secondary | ICD-10-CM | POA: Diagnosis not present

## 2020-05-11 DIAGNOSIS — G43719 Chronic migraine without aura, intractable, without status migrainosus: Secondary | ICD-10-CM | POA: Diagnosis not present

## 2020-05-11 DIAGNOSIS — D509 Iron deficiency anemia, unspecified: Secondary | ICD-10-CM | POA: Diagnosis not present

## 2020-05-11 DIAGNOSIS — E119 Type 2 diabetes mellitus without complications: Secondary | ICD-10-CM

## 2020-05-11 DIAGNOSIS — I10 Essential (primary) hypertension: Secondary | ICD-10-CM

## 2020-05-11 DIAGNOSIS — J4540 Moderate persistent asthma, uncomplicated: Secondary | ICD-10-CM

## 2020-05-11 NOTE — Patient Instructions (Addendum)
Get ASA 81 mg EC at pharmacy.   Keep up the weight loss with healthy eating, recommend increase in physical activity as tolerates.   To get TDAP at your local pharmacy and to get COVID booster

## 2020-05-11 NOTE — Progress Notes (Signed)
Careteam: Patient Care Team: Tabitha Chandler, NP as PCP - General (Geriatric Medicine) Tabitha Dresser, MD as PCP - Cardiology (Cardiology) Tabitha Beam, MD as Consulting Physician (Neurology) Tabitha Jacks, MD as Consulting Physician (Ophthalmology)  PLACE OF SERVICE:  [place] Directive information Does Patient Have a Medical Advance Directive?: No, Would patient like information on creating a medical advance directive?: No - Patient declined  No Known Allergies  Chief Complaint  Patient presents with  . Medical Management of Chronic Issues    3 month follow up visit.   Tabitha Oneal Best Practice Recommendations    Tetanus/Tdap, Pneumonia vaccine, COVID-19 booster     HPI: Patient is a 64 y.o. female for follow up.   DM- reports she increased metformin to 1000 mg by mouth twice daily. Does not check blood sugars at home. "never misses it" in reference to taking medication  Denies hypoglycemia Reports decrease in appetite since taking diabetic medication.  States she is not on jardiance   Chronic back pain- stable on gabapentin.  Tabitha Oneal osa on cpap- stable, tolerating well.   htn- recommended to start ASA per cardiology   Asthma- stable on breo, uses albuterol PRN, followed by pulmonary.  Review of Systems:  Review of Systems  Constitutional: Negative for chills and fever.  HENT: Negative for tinnitus.   Respiratory: Negative for cough, sputum production and shortness of breath.   Cardiovascular: Negative for chest pain, palpitations and leg swelling.  Gastrointestinal: Negative for abdominal pain, constipation, diarrhea and heartburn.  Genitourinary: Negative for dysuria, frequency and urgency.  Musculoskeletal: Negative for back pain, falls, joint pain and myalgias.  Skin: Negative.   Neurological: Negative for dizziness and headaches.  Psychiatric/Behavioral: Negative for depression and memory loss. The patient does not have insomnia.     Past  Medical History:  Diagnosis Date  . Asthma    QVAR daily and Albuterol as needed  . Bladder infection    taking Keflex daily   . Chronic back pain    reason unknown  . Diabetes mellitus    takes Metformin and Tradjenta daily  . GERD (gastroesophageal reflux disease)    takes Omeprazole daily  . Hard of hearing   . Headache    daily.Takes Excedrine daily  . Hepatitis C   . History of blood transfusion    no abnormal reaction noted  . Hyperlipidemia    takes Atorvastatin daily  . Hypertension    takes Quinapril daily  . Joint pain   . Joint swelling   . Nocturia   . Obesity   . Osteoarthritis of left knee 02/23/2012  . Seasonal allergies    takes Claritin and Singulair daily.Nasal spray as needed  . Shortness of breath dyspnea    with exertion   Past Surgical History:  Procedure Laterality Date  . ABDOMINAL HYSTERECTOMY    . EYE SURGERY  2021  . INJECTION KNEE  04/09/2012   Procedure: KNEE INJECTION;  Surgeon: Johnny Bridge, MD;  Location: [facility];  Service: Orthopedics;  Laterality: Left;  . JOINT REPLACEMENT     acl   bil knees  . KNEE ARTHROTOMY Left 08/28/2015   Procedure: LEFT KNEE ARTHROFIBROSIS EXCISION; pylectomy;  Surgeon: Meredith Pel, MD;  Location: [facility];  Service: Orthopedics;  Laterality: Left;  . KNEE CLOSED REDUCTION  04/09/2012   Procedure: CLOSED MANIPULATION KNEE;  Surgeon: Johnny Bridge, MD;  Location: [facility];  Service: Orthopedics;  Laterality:  Left;  Manipulation Knee with Anesthesia includes Application of Traction   . TOTAL KNEE ARTHROPLASTY  02/23/2012   Procedure: TOTAL KNEE ARTHROPLASTY;  Surgeon: Johnny Bridge, MD;  Location: [facility];  Service: Orthopedics;  Laterality: Left;   Social History:   reports that she has never smoked. She has never used smokeless tobacco. She reports that she does not drink alcohol and does not use drugs.  Family History  Problem Relation Age of Onset  . Bronchitis  Father 30  . Alzheimer's disease Mother   . Ovarian cancer Mother   . Stomach cancer Mother   . High blood pressure Sister   . High blood pressure Sister   . Headache Sister        pt thinks   . High blood pressure Sister   . High blood pressure Sister        pt thinks  . Breast cancer Sister   . High blood pressure Sister   . High blood pressure Sister   . Headache Daughter   . Diabetes Daughter   . Diabetes Daughter   . Diabetes Other   . Colon cancer Neg Hx   . Esophageal cancer Neg Hx   . Rectal cancer Neg Hx     Medications: Patient's Medications  New Prescriptions   No medications on file  Previous Medications   AMLODIPINE (NORVASC) 10 MG TABLET    Take 10 mg by mouth daily.   ASPIRIN EC 81 MG TABLET    Take 1 tablet (81 mg total) by mouth daily. Swallow whole.   AZELASTINE (ASTELIN) 0.1 % NASAL SPRAY    USE 1 SPRAY EACH NOSTRIL TWICE DAILY   BREO ELLIPTA 200-25 MCG/INH AEPB    INHALE 1 PUFF BY MOUTH EVERY DAY   CALCIUM CARB-CHOLECALCIFEROL (CALCIUM 600 + D PO)    Take by mouth.   DICLOFENAC SODIUM (VOLTAREN) 1 % GEL    Apply 8 g topically 2 (two) times daily as needed. On knees   FERROUS SULFATE 325 (65 FE) MG TABLET    TAKE 1 TABLET BY MOUTH EVERY DAY WITH BREAKFAST   FLUTICASONE (FLONASE) 50 MCG/ACT NASAL SPRAY    SPRAY 2 SPRAYS INTO EACH NOSTRIL EVERY DAY   GABAPENTIN (NEURONTIN) 100 MG CAPSULE    Take 100 mg by mouth 3 (three) times daily.   IPRATROPIUM (ATROVENT) 0.03 % NASAL SPRAY    Place 2 sprays into both nostrils 2 (two) times daily.   JARDIANCE 10 MG TABS TABLET    Take 10 mg by mouth every morning.    METFORMIN (GLUCOPHAGE) 1000 MG TABLET    Start with 1000 mg in the morning and 500 mg in the evening x 1 week then increase to 1000 mg in the morning and 1000 mg in the evening   MONTELUKAST (SINGULAIR) 10 MG TABLET    TAKE 1 TABLET BY MOUTH EVERYDAY AT BEDTIME   OMEPRAZOLE (PRILOSEC) 40 MG CAPSULE    TAKE 1 CAPSULE BY MOUTH EVERY DAY   ONE TOUCH ULTRA TEST  TEST STRIP    Check blood sugar once daily as directed   PROAIR RESPICLICK 465 (90 BASE) MCG/ACT AEPB    INHALE 4 PUFFS INTO THE LUNGS EVERY 4 HOURS AS NEEDED   QUINAPRIL (ACCUPRIL) 40 MG TABLET    Take 1 tablet (40 mg total) by mouth at bedtime.   ROSUVASTATIN (CRESTOR) 10 MG TABLET    Take 1 tablet (10 mg total) by mouth daily.   TIZANIDINE (  ZANAFLEX) 4 MG TABLET    Take 1 tablet (4 mg total) by mouth every 6 (six) hours as needed for muscle spasms. For headaches.   TOPIRAMATE (TOPAMAX) 100 MG TABLET    Take 100 mg by mouth at bedtime.  Modified Medications   No medications on file  Discontinued Medications   AMOXICILLIN-CLAVULANATE (AUGMENTIN) 875-125 MG TABLET    Take 1 tablet by mouth 2 (two) times daily.    Physical Exam:  Vitals:   05/11/20 1023  BP: 122/80  Pulse: 80  Temp: (!) 97 F (36.1 C)  TempSrc: Temporal  Weight: 211 lb 6.4 oz (95.9 kg)  Height: 5' 2.5" (1.588 m)   Body mass index is 38.05 kg/m. Wt Readings from Last 3 Encounters:  05/11/20 211 lb 6.4 oz (95.9 kg)  03/07/20 222 lb (100.7 kg)  02/15/20 224 lb 9.6 oz (101.9 kg)    Physical Exam Constitutional:      General: She is not in acute distress.    Appearance: She is well-developed and well-nourished. She is not diaphoretic.  HENT:     Head: Normocephalic and atraumatic.     Mouth/Throat:     Mouth: Oropharynx is clear and moist.     Pharynx: No oropharyngeal exudate.  Eyes:     Conjunctiva/sclera: Conjunctivae normal.     Pupils: Pupils are equal, round, and reactive to light.  Cardiovascular:     Rate and Rhythm: Normal rate and regular rhythm.     Heart sounds: Normal heart sounds.  Pulmonary:     Effort: Pulmonary effort is normal.     Breath sounds: Normal breath sounds.  Abdominal:     General: Bowel sounds are normal.     Palpations: Abdomen is soft.  Musculoskeletal:        General: No tenderness or edema.     Cervical back: Normal range of motion and neck supple.  Skin:     General: Skin is warm and dry.  Neurological:     Mental Status: She is alert and oriented to person, place, and time.  Psychiatric:        Mood and Affect: Mood and affect normal.   Labs reviewed: Basic Metabolic Panel: Recent Labs    05/18/19 1026 02/15/20 1027  NA 139 140  K 4.1 4.2  CL 106 106  CO2 24 25  GLUCOSE 147* 153*  BUN 13 11  CREATININE 0.99 0.89  CALCIUM 10.1 10.1   Liver Function Tests: Recent Labs    05/18/19 1026 02/15/20 1027  AST 28 15  ALT 21 12  BILITOT 0.5 0.3  PROT 8.5* 8.0   No results for input(s): LIPASE, AMYLASE in the last 8760 hours. No results for input(s): AMMONIA in the last 8760 hours. CBC: Recent Labs    02/15/20 1027  WBC 9.4  NEUTROABS 5,894  HGB 11.5*  HCT 35.2  MCV 79.3*  PLT 298   Lipid Panel: Recent Labs    05/18/19 1026 02/15/20 1027  CHOL 204* 173  HDL 73 62  LDLCALC 116* 92  TRIG 60 98  CHOLHDL 2.8 2.8   TSH: No results for input(s): TSH in the last 8760 hours. A1C: Lab Results  Component Value Date   HGBA1C 8.4 (H) 02/15/2020     Assessment/Plan 1. Type 2 diabetes mellitus without complication, without long-term current use of insulin (HCC) -has increase metformin to 1000 mg twice daily, not taking jardiance. Reports she has had decrease in appetite, and eating less overall. Encouraged  dietary compliance, routine foot care/monitoring and to keep up with diabetic eye exams through ophthalmology  - Hemoglobin A1c  2. Essential hypertension, benign -well controlled on norvasc and quinapril. Continue dietary modification and medication.  - CBC with Differential/Platelet - COMPLETE METABOLIC PANEL WITH GFR  3. Iron deficiency anemia, unspecified iron deficiency anemia type Will follow up labs at this time. Continues on iron supplement. No signs of blood loss.  - CBC with Differential/Platelet - Iron, TIBC and Ferritin Panel  4. Intractable chronic migraine without aura and without status  migrainosus Stable on topiramate at bedtime.   5. Hyperlipidemia associated with type 2 diabetes mellitus ([place]) Continues on crestor 20 mg daily, follow up lipids today, goal LDL <70.  - Lipid Panel  6. Morbid obesity ([place]) BMI 38 with diabetes, htn and hyperlipidemia. Encouraged healthy lifestyle changes to promote weight loss.   7. Need for pneumococcal vaccination - Pneumococcal conjugate vaccine 13-valent  Next appt: 3 months.  Carlos American. [place], [facility] [phone]

## 2020-05-12 LAB — CBC WITH DIFFERENTIAL/PLATELET
Absolute Monocytes: 348 cells/uL (ref 200–950)
Basophils Absolute: 67 cells/uL (ref 0–200)
Basophils Relative: 0.9 %
Eosinophils Absolute: 259 cells/uL (ref 15–500)
Eosinophils Relative: 3.5 %
HCT: 34.8 % — ABNORMAL LOW (ref 35.0–45.0)
Hemoglobin: 11.3 g/dL — ABNORMAL LOW (ref 11.7–15.5)
Lymphs Abs: 2190 cells/uL (ref 850–3900)
MCH: 25.6 pg — ABNORMAL LOW (ref 27.0–33.0)
MCHC: 32.5 g/dL (ref 32.0–36.0)
MCV: 78.7 fL — ABNORMAL LOW (ref 80.0–100.0)
MPV: 11.8 fL (ref 7.5–12.5)
Monocytes Relative: 4.7 %
Neutro Abs: 4536 cells/uL (ref 1500–7800)
Neutrophils Relative %: 61.3 %
Platelets: 371 10*3/uL (ref 140–400)
RBC: 4.42 10*6/uL (ref 3.80–5.10)
RDW: 13.8 % (ref 11.0–15.0)
Total Lymphocyte: 29.6 %
WBC: 7.4 10*3/uL (ref 3.8–10.8)

## 2020-05-12 LAB — COMPLETE METABOLIC PANEL WITH GFR
AG Ratio: 1.3 (calc) (ref 1.0–2.5)
ALT: 10 U/L (ref 6–29)
AST: 13 U/L (ref 10–35)
Albumin: 4.3 g/dL (ref 3.6–5.1)
Alkaline phosphatase (APISO): 148 U/L (ref 37–153)
BUN: 9 mg/dL (ref 7–25)
CO2: 24 mmol/L (ref 20–32)
Calcium: 10.3 mg/dL (ref 8.6–10.4)
Chloride: 104 mmol/L (ref 98–110)
Creat: 0.97 mg/dL (ref 0.50–0.99)
GFR, Est African American: 71 mL/min/{1.73_m2} (ref >=60)
GFR, Est Non African American: 61 mL/min/{1.73_m2} (ref >=60)
Globulin: 3.4 g/dL (calc) (ref 1.9–3.7)
Glucose, Bld: 118 mg/dL — ABNORMAL HIGH (ref 65–99)
Potassium: 4.4 mmol/L (ref 3.5–5.3)
Sodium: 137 mmol/L (ref 135–146)
Total Bilirubin: 0.4 mg/dL (ref 0.2–1.2)
Total Protein: 7.7 g/dL (ref 6.1–8.1)

## 2020-05-12 LAB — IRON,TIBC AND FERRITIN PANEL
%SAT: 29 % (calc) (ref 16–45)
Ferritin: 138 ng/mL (ref 16–288)
Iron: 89 ug/dL (ref 45–160)
TIBC: 306 mcg/dL (calc) (ref 250–450)

## 2020-05-12 LAB — HEMOGLOBIN A1C
Hgb A1c MFr Bld: 7.1 % of total Hgb — ABNORMAL HIGH (ref <5.7)
Mean Plasma Glucose: 157 mg/dL
eAG (mmol/L): 8.7 mmol/L

## 2020-05-12 LAB — LIPID PANEL
Cholesterol: 164 mg/dL (ref <200)
HDL: 51 mg/dL (ref >=50)
LDL Cholesterol (Calc): 94 mg/dL (calc)
Non-HDL Cholesterol (Calc): 113 mg/dL (calc) (ref <130)
Total CHOL/HDL Ratio: 3.2 (calc) (ref <5.0)
Triglycerides: 95 mg/dL (ref <150)

## 2020-05-14 ENCOUNTER — Other Ambulatory Visit: Payer: Self-pay

## 2020-05-14 DIAGNOSIS — E785 Hyperlipidemia, unspecified: Secondary | ICD-10-CM

## 2020-05-14 DIAGNOSIS — E1169 Type 2 diabetes mellitus with other specified complication: Secondary | ICD-10-CM

## 2020-05-14 MED ORDER — ROSUVASTATIN CALCIUM 20 MG PO TABS: 20.0000 mg | ORAL_TABLET | Freq: Every day | ORAL | 3 refills | Status: DC

## 2020-05-16 ENCOUNTER — Ambulatory Visit: Payer: Medicare Other | Admitting: Nurse Practitioner

## 2020-05-30 ENCOUNTER — Telehealth: Payer: Self-pay | Admitting: Allergy & Immunology

## 2020-05-30 NOTE — Telephone Encounter (Signed)
Patient is requesting medication. She said she is congested and has thick mucus and when she blows her nose, she can't blow it out, nose also bleeds when she tries to blow her nose. [facility]. Has an appointment on 06/07/20.

## 2020-05-30 NOTE — Telephone Encounter (Signed)
Please advise 

## 2020-05-31 ENCOUNTER — Ambulatory Visit: Payer: Medicare Other | Admitting: Allergy & Immunology

## 2020-05-31 NOTE — Telephone Encounter (Signed)
Can we add as a televisit today?  Malachi Bonds, MD [facility]

## 2020-05-31 NOTE — Telephone Encounter (Signed)
Left message for patient to callback and schedule sooner appt. She is scheduled for next Thursday 1/13.

## 2020-06-04 IMAGING — CT CT ABD-PELV W/ CM
2 of 5 series · 17 of 46 positions shown, 19 images · IV contrast (omnipaque)
Comparison: 07/30/2014 CT

CLINICAL DATA: 63-year-old female with acute abdominal pain with
nausea, vomiting and diarrhea.

EXAM:
CT ABDOMEN AND PELVIS WITH CONTRAST
TECHNIQUE: Multidetector CT imaging of the abdomen and pelvis was performed
using the standard protocol following bolus administration of
intravenous contrast.
CONTRAST:  100mL OMNIPAQUE IOHEXOL 300 MG/ML  SOLN

[Series 2: axial st · axial · 0.71mm/px · z∈[+1202,+1572]mm · 14 of 86 slices shown, 16 images]
[im 6/86  soft-tissue]
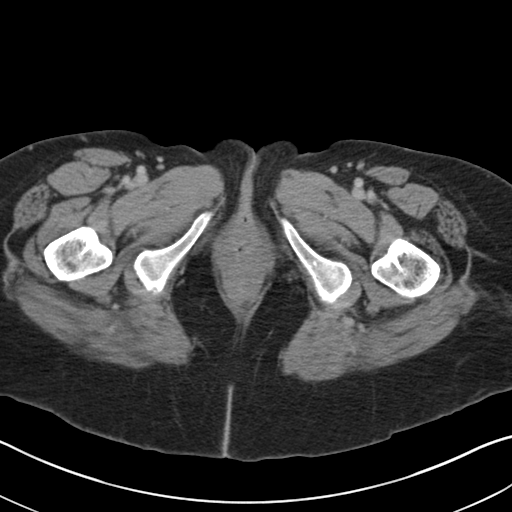
[im 6/86  bone]
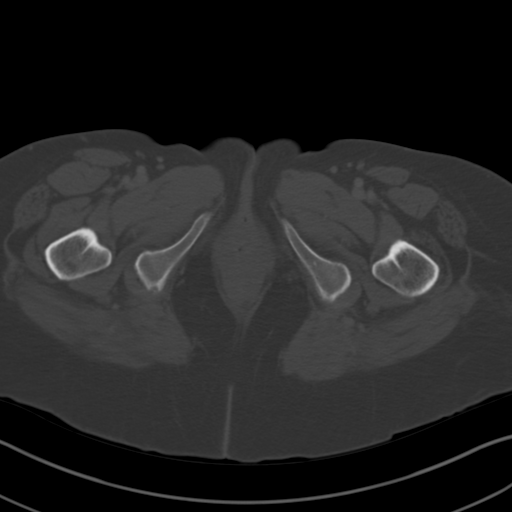
[im 11/86  soft-tissue]
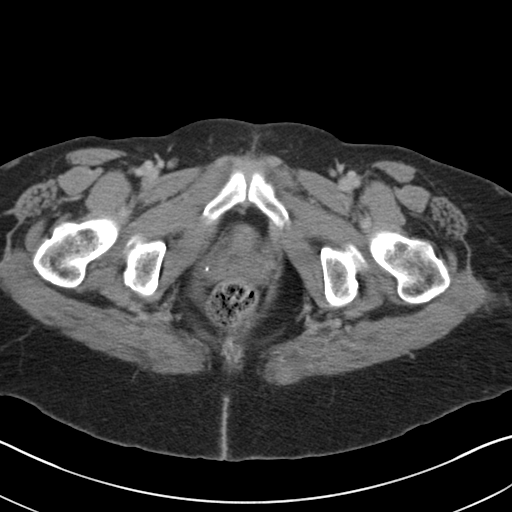
[im 16/86  soft-tissue]
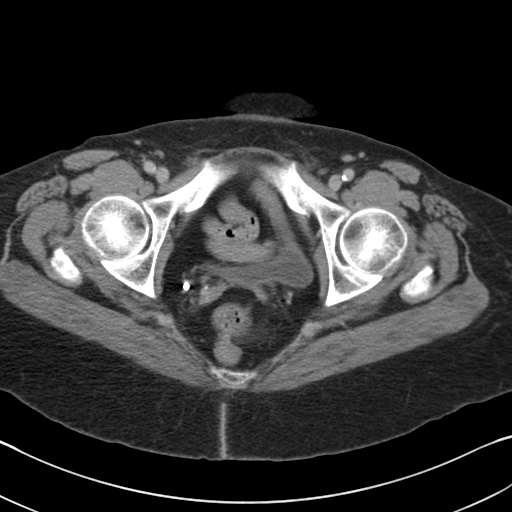
[im 22/86  soft-tissue]
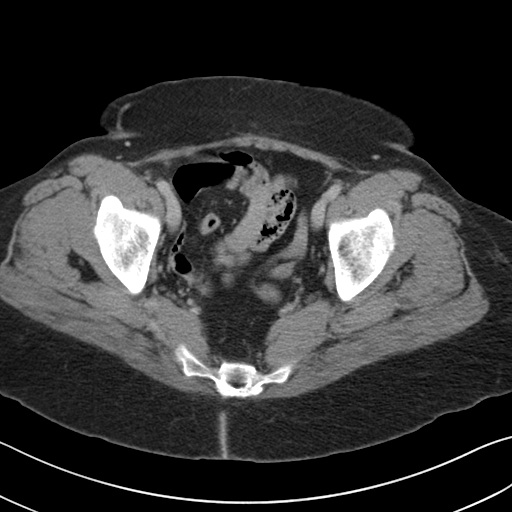
[im 27/86  soft-tissue]
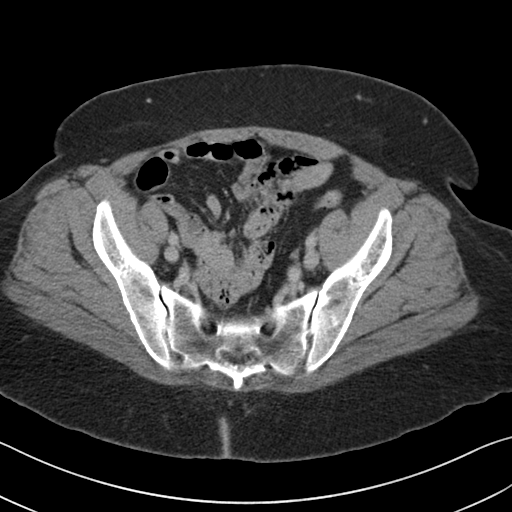
[im 32/86  soft-tissue]
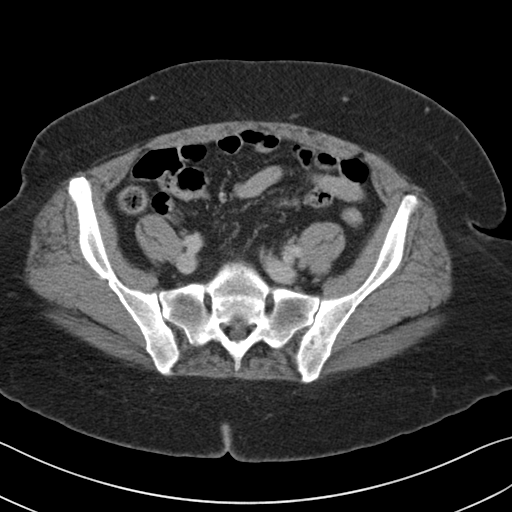
[im 38/86  soft-tissue]
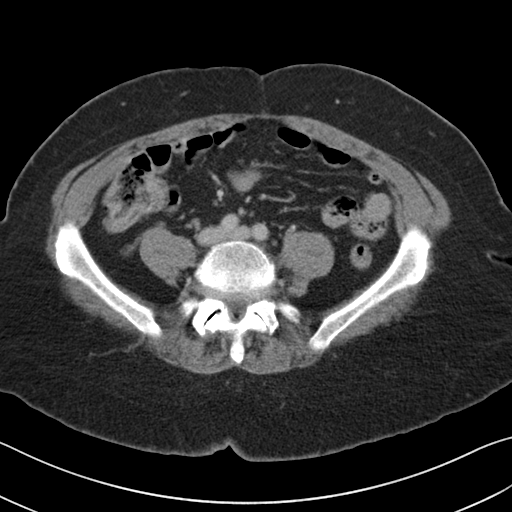
[im 48/86  soft-tissue]
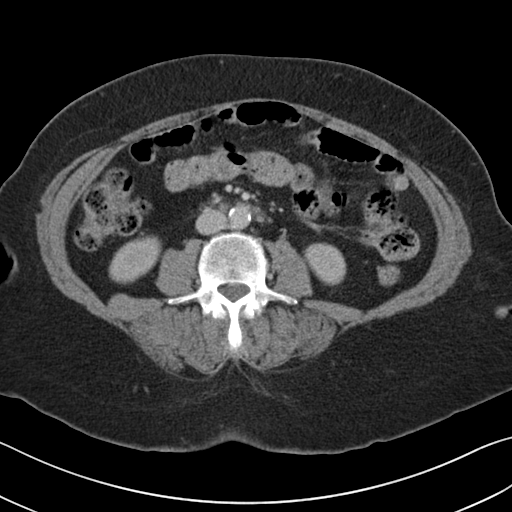
[im 54/86  soft-tissue]
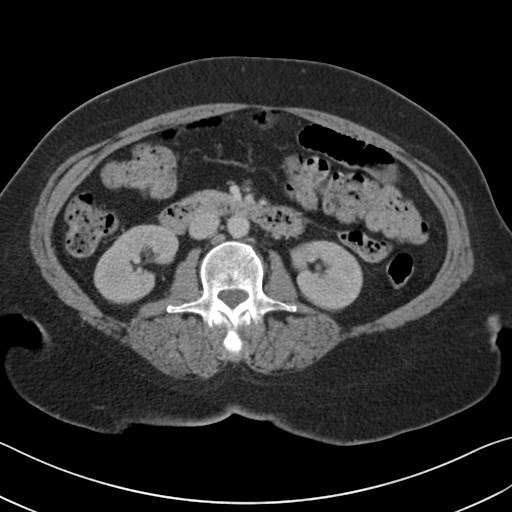
[im 54/86  bone]
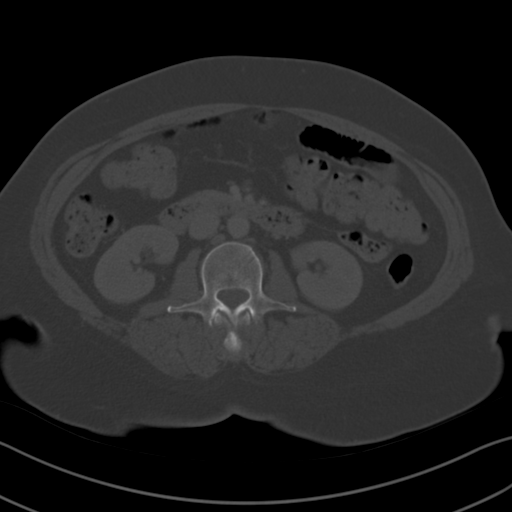
[im 59/86  soft-tissue]
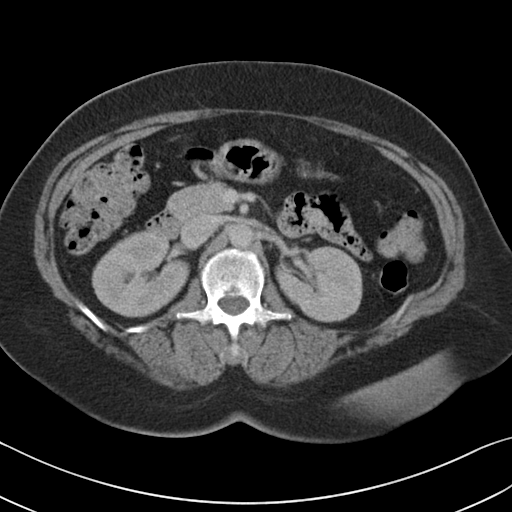
[im 64/86  soft-tissue]
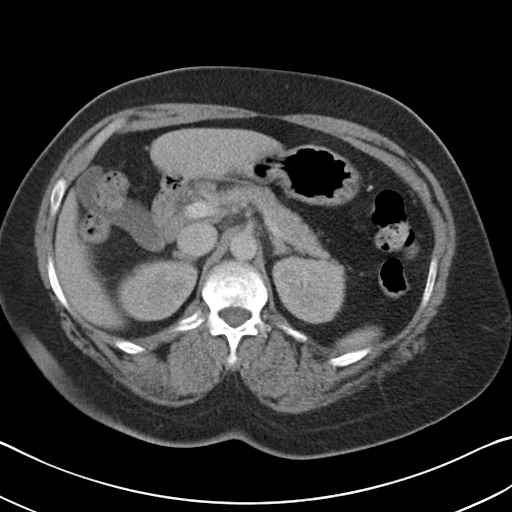
[im 70/86  soft-tissue]
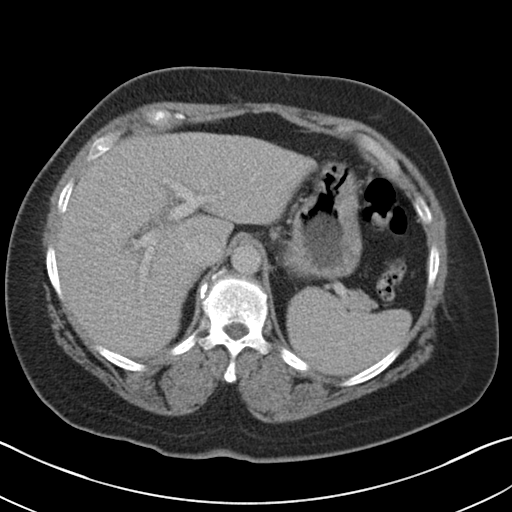
[im 75/86  soft-tissue]
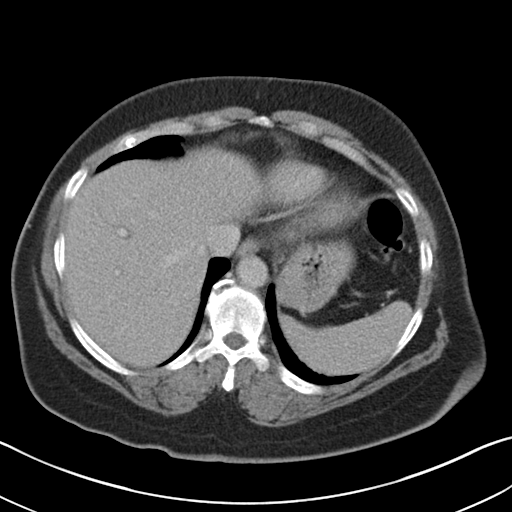
[im 80/86  soft-tissue]
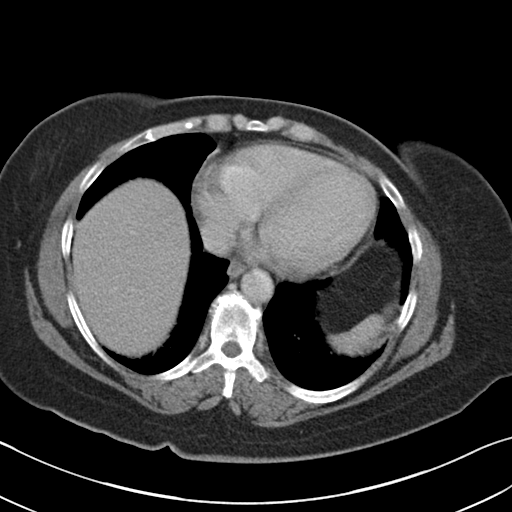

[Series 5: coronal st · coronal · 0.78mm/px · 3 of 142 slices shown]
[im 48/142  soft-tissue]
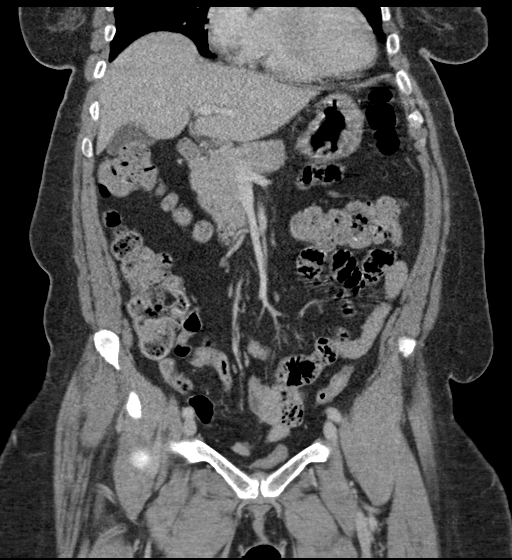
[im 63/142  soft-tissue]
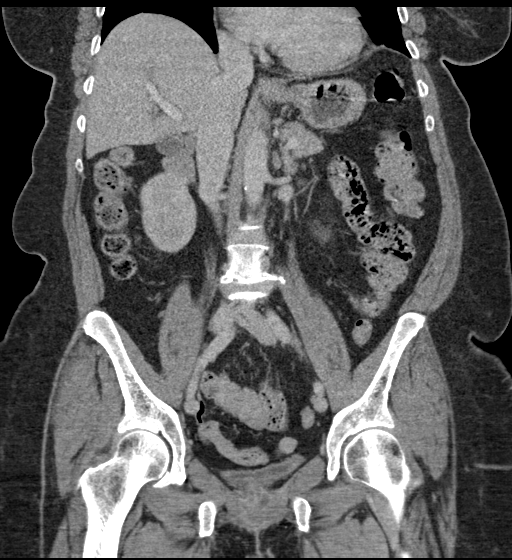
[im 79/142  soft-tissue]
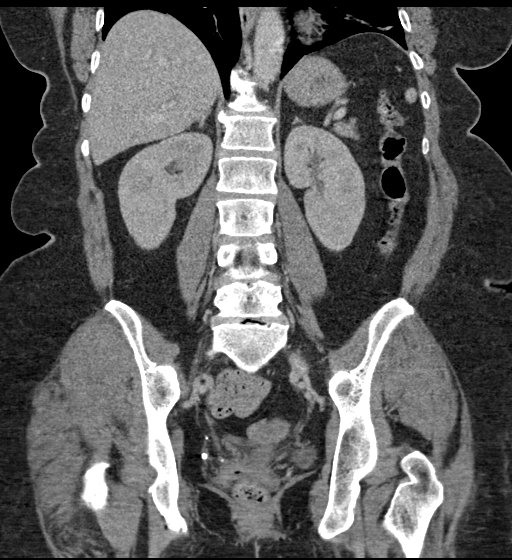

[17 of 46 positions shown; findings below may reference images not displayed]

FINDINGS: Lower chest: No acute abnormality. Mild bibasilar scarring again
noted.

Hepatobiliary: The liver and gallbladder are unremarkable. No
biliary dilatation.

Pancreas: Unremarkable

Spleen: Unremarkable

Adrenals/Urinary Tract: The kidneys, adrenal glands and bladder are
unremarkable.

Stomach/Bowel: Stomach is within normal limits. Appendix appears
normal. No evidence of bowel wall thickening, distention, or
inflammatory changes.

Vascular/Lymphatic: Aortic atherosclerosis. No enlarged abdominal or
pelvic lymph nodes.

Reproductive: Status post hysterectomy. No adnexal masses.

Other: No ascites, focal collection or pneumoperitoneum.

Musculoskeletal: No acute or suspicious bony abnormalities noted.
Mild degenerative disc disease/spondylosis in the LOWER lumbar spine
noted.
IMPRESSION: 1. No evidence of acute abnormality. No CT findings to suggest a
cause for this patient's abdominal pain.
2.  Aortic Atherosclerosis ([id number]-YVI.I).

## 2020-06-07 ENCOUNTER — Other Ambulatory Visit: Payer: Self-pay

## 2020-06-07 ENCOUNTER — Ambulatory Visit: Payer: Medicare Other | Admitting: Allergy & Immunology

## 2020-06-07 ENCOUNTER — Encounter: Payer: Self-pay | Admitting: Allergy & Immunology

## 2020-06-07 VITALS — BP 122/76 | HR 96 | Temp 97.4°F | Resp 18 | Ht 62.0 in | Wt 211.8 lb

## 2020-06-07 DIAGNOSIS — J3089 Other allergic rhinitis: Secondary | ICD-10-CM

## 2020-06-07 DIAGNOSIS — J0100 Acute maxillary sinusitis, unspecified: Secondary | ICD-10-CM

## 2020-06-07 DIAGNOSIS — Z20822 Contact with and (suspected) exposure to covid-19: Secondary | ICD-10-CM

## 2020-06-07 DIAGNOSIS — J3489 Other specified disorders of nose and nasal sinuses: Secondary | ICD-10-CM

## 2020-06-07 DIAGNOSIS — J302 Other seasonal allergic rhinitis: Secondary | ICD-10-CM

## 2020-06-07 DIAGNOSIS — J310 Chronic rhinitis: Secondary | ICD-10-CM | POA: Diagnosis not present

## 2020-06-07 DIAGNOSIS — J4540 Moderate persistent asthma, uncomplicated: Secondary | ICD-10-CM

## 2020-06-07 DIAGNOSIS — K219 Gastro-esophageal reflux disease without esophagitis: Secondary | ICD-10-CM | POA: Diagnosis not present

## 2020-06-07 LAB — POC COVID19 BINAXNOW: SARS Coronavirus 2 Ag: NEGATIVE

## 2020-06-07 MED ORDER — AMOXICILLIN-POT CLAVULANATE 875-125 MG PO TABS: 1.0000 | ORAL_TABLET | Freq: Two times a day (BID) | ORAL | 0 refills | Status: AC

## 2020-06-07 NOTE — Patient Instructions (Addendum)
1. Moderate persistent asthma, uncomplicated - Spirometry was not done today. - Continue to take the Breo one puff once daily.  - Daily controller medication(s): Breo 200/28mcg one puff once daily - Prior to physical activity: ProAir 2 puffs 10-15 minutes before physical activity. - Rescue medications: ProAir 4 puffs every 4-6 hours as needed or albuterol nebulizer one vial every 4-6 hours as needed - Asthma control goals:  * Full participation in all desired activities (may need albuterol before activity) * Albuterol use two time or less a week on average (not counting use with activity) * Cough interfering with sleep two time or less a month * Oral steroids no more than once a year * No hospitalizations  2. Chronic rhinitis - Continue with nasal ipratropium one spray per nostril every six hours AS NEEDED. - Continue with fluticasone nasal spray two sprays per nostril daily (AIM FOR THE EARS). - This might be what is causing your nosebleeds.  - Continue with Singulair 10mg  daily.   3. Sinusitis - Start Augmentin twice daily for ten days.  - Start the prednisone pack provided.  - Hopefully you start to feel better. - Mucinex samples provided (up to two tablets twice daily)   4. GERD  - Controlled without reflux medications.   4. Return in about 6 weeks (around 07/19/2020).    Please inform us of any Emergency Department visits, hospitalizations, or changes in symptoms. Call us before going to the ED for breathing or allergy symptoms since we might be able to fit you in for a sick visit. Feel free to contact us anytime with any questions, problems, or concerns.  It was a pleasure to see you again today!  Websites that have reliable patient information: 1. American Academy of Asthma, Allergy, and Immunology: www.aaaai.org 2. Food Allergy Research and Education (FARE): foodallergy.org 3. Mothers of Asthmatics: http://www.asthmacommunitynetwork.org 4. American College of Allergy,  Asthma, and Immunology: www.acaai.org   COVID-19 Vaccine Information can be found at: ShippingScam.co.uk For questions related to vaccine distribution or appointments, please email [email or web address] or call [phone].     "Like" [place] on Facebook and Instagram for our latest updates!       Make sure you are registered to vote! If you have moved or changed any of your contact information, you will need to get this updated before voting!  In some cases, you MAY be able to register to vote online: [email or web address]

## 2020-06-07 NOTE — Progress Notes (Signed)
FOLLOW UP  Date of Service/Encounter:  06/07/20   Assessment:   Moderate persistent asthma, uncomplicated - very stable  Seasonal and perennial allergic rhinitis(trees, mold, dust mite)  Gastroesophageal reflux disease  Plan/Recommendations:   1. Moderate persistent asthma, uncomplicated - Spirometry was not done today. - Continue to take the Breo one puff once daily.  - Daily controller medication(s): Breo 200/52mcg one puff once daily - Prior to physical activity: ProAir 2 puffs 10-15 minutes before physical activity. - Rescue medications: ProAir 4 puffs every 4-6 hours as needed or albuterol nebulizer one vial every 4-6 hours as needed - Asthma control goals:  * Full participation in all desired activities (may need albuterol before activity) * Albuterol use two time or less a week on average (not counting use with activity) * Cough interfering with sleep two time or less a month * Oral steroids no more than once a year * No hospitalizations  2. Chronic rhinitis - Continue with nasal ipratropium one spray per nostril every six hours AS NEEDED. - Continue with fluticasone nasal spray two sprays per nostril daily (AIM FOR THE EARS). - This might be what is causing your nosebleeds.  - Continue with Singulair 10mg  daily.   3. Sinusitis - Start Augmentin twice daily for ten days.  - Start the prednisone pack provided.  - Hopefully you start to feel better. - Mucinex samples provided (up to two tablets twice daily)   4. GERD  - Controlled without reflux medications.   5. Return in about 6 weeks (around 07/19/2020).    Subjective:   Tabitha Oneal is a 66 y.o. female presenting today for follow up of  Chief Complaint  Patient presents with  . Asthma  . Cough  . Sinus Problem    2 weeks    Tabitha Oneal has a history of the following: Patient Active Problem List   Diagnosis Date Noted  . Pain in left knee 09/13/2019  . Aftercare following left  knee joint replacement surgery 07/19/2019  . Abnormal EKG 03/02/2019  . Incomplete left bundle branch block (LBBB) 03/02/2019  . Rebound headache 12/13/2018  . Overuse of medication 12/13/2018  . Intractable chronic migraine without aura and without status migrainosus 12/13/2018  . Microcytic anemia 02/24/2018  . High risk medication use 08/06/2017  . Seasonal and perennial allergic rhinitis 07/30/2017  . Recurrent infections 07/30/2017  . Cough 06/08/2017  . Moderate persistent asthma without complication [id number]  . Chronic constipation 02/04/2017  . Hyperlipidemia due to type 2 diabetes mellitus ([place]) 02/04/2017  . Hallux rigidus, left foot 01/01/2017  . Postmenopausal symptoms 02/22/2016  . Arthrofibrosis of knee joint 08/28/2015  . Left hip pain 10/08/2014  . Type 2 diabetes mellitus without complication, without long-term current use of insulin ([place]) 10/08/2014  . Hyperlipidemia LDL goal <100 10/08/2014  . Essential hypertension, benign 08/30/2014  . Diabetes mellitus, type 2 ([place]) 08/30/2014  . Diverticulosis of colon without hemorrhage 08/30/2014  . Chronic low back pain 08/30/2014  . Chronic pain syndrome 08/30/2014  . Asthma in adult without complication [id number]  . 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 Osteoarthritis of left knee 02/23/2012    History obtained from: chart review and patient.  Tabitha Oneal is a 65 y.o. female presenting for a sick visit.  She was last seen in July 2021.  At that time, her medications seem to be working well.  We continue with Breo 200 mcg daily as well as albuterol as  needed.  For her rhinitis, we continue with nasal ipratropium as needed for runny noses as well as Flonase and Singulair.  We also continued her reflux medicines.  In the interim, she called on January 5 with complaints of sinus issues.  She was requesting antibiotics over the phone, but we recommended a televisit.  She presents today for an in  person visit. She has been sick for a total of two weeks, although she is a poor historian.   She reports that she has had something stuck in her throat with some sinus pain for nearly two weeks. She does have some blood coming out as well. She has not had a fever. She reports that she is eating and drinking OK. She does report some stuff draining down her throat.  She has been using some Nyquil and Tylenol. She does not use these every night.   Asthma/Respiratory Symptom History: She remains on her Breo 1 puff once daily.  She has not needed her rescue inhaler at all.  She denies any nighttime coughing.  She has not needed prednisone in quite some time.  Allergic Rhinitis Symptom History: She remains on the Flonase and the montelukast.  She has not needed antibiotics in over a year.  She is fully boosted to the COVID-19 vaccine.  She did have a rapid COVID 2 weeks ago that was negative.  She does endorse a lot of burping when she drinks water.   Otherwise, there have been no changes to her past medical history, surgical history, family history, or social history.    Review of Systems  Constitutional: Negative.  Negative for chills, fever, malaise/fatigue and weight loss.  HENT: Positive for congestion and sinus pain. Negative for ear discharge and ear pain.   Eyes: Negative for pain, discharge and redness.  Respiratory: Negative for cough, sputum production, shortness of breath and wheezing.   Cardiovascular: Negative.  Negative for chest pain and palpitations.  Gastrointestinal: Negative for abdominal pain, constipation, diarrhea, heartburn, nausea and vomiting.  Skin: Negative.  Negative for itching and rash.  Neurological: Negative for dizziness and headaches.  Endo/Heme/Allergies: Positive for environmental allergies. Does not bruise/bleed easily.       Objective:   Blood pressure 122/76, pulse 96, temperature (!) 97.4 F (36.3 C), temperature source Temporal, resp. rate 18,  height 5\' 2"  (1.575 m), weight 211 lb 12.8 oz (96.1 kg), SpO2 98 %. Body mass index is 38.74 kg/m.   Physical Exam:  Physical Exam Constitutional:      Appearance: She is well-developed.  HENT:     Head: Normocephalic and atraumatic.     Right Ear: Tympanic membrane, ear canal and external ear normal.     Left Ear: Tympanic membrane, ear canal and external ear normal.     Nose: No nasal deformity, septal deviation, mucosal edema, rhinorrhea or epistaxis.     Right Turbinates: Enlarged and swollen.     Left Turbinates: Enlarged and swollen.     Right Sinus: Frontal sinus tenderness present. No maxillary sinus tenderness.     Left Sinus: Frontal sinus tenderness present. No maxillary sinus tenderness.     Comments: Dried purulent discharge from the left nare.    Mouth/Throat:     Mouth: Oropharynx is clear and moist. Mucous membranes are not pale and not dry.     Pharynx: Uvula midline.  Eyes:     General:        Right eye: No discharge.  Left eye: No discharge.     Extraocular Movements: EOM normal.     Conjunctiva/sclera: Conjunctivae normal.     Right eye: Right conjunctiva is not injected. No chemosis.    Left eye: Left conjunctiva is not injected. No chemosis.    Pupils: Pupils are equal, round, and reactive to light.  Cardiovascular:     Rate and Rhythm: Normal rate and regular rhythm.     Heart sounds: Normal heart sounds.  Pulmonary:     Effort: Pulmonary effort is normal. No tachypnea, accessory muscle usage or respiratory distress.     Breath sounds: Normal breath sounds. No wheezing, rhonchi or rales.  Chest:     Chest wall: No tenderness.  Lymphadenopathy:     Cervical: Cervical adenopathy present.     Right cervical: Superficial cervical adenopathy present.  Skin:    Coloration: Skin is not pale.     Findings: No abrasion, erythema, petechiae or rash. Rash is not papular, urticarial or vesicular.  Neurological:     Mental Status: She is alert.   Psychiatric:        Mood and Affect: Mood and affect normal.      Diagnostic studies:     Rapid COVID19 swab: negative     Salvatore Marvel, MD  [facility]

## 2020-06-23 ENCOUNTER — Other Ambulatory Visit: Payer: Self-pay | Admitting: Allergy & Immunology

## 2020-08-07 ENCOUNTER — Other Ambulatory Visit: Payer: Self-pay | Admitting: Nurse Practitioner

## 2020-08-10 ENCOUNTER — Other Ambulatory Visit: Payer: Self-pay

## 2020-08-10 ENCOUNTER — Encounter: Payer: Self-pay | Admitting: Nurse Practitioner

## 2020-08-10 ENCOUNTER — Ambulatory Visit (INDEPENDENT_AMBULATORY_CARE_PROVIDER_SITE_OTHER): Payer: Medicare Other | Admitting: Nurse Practitioner

## 2020-08-10 VITALS — BP 140/80 | HR 77 | Temp 97.1°F | Ht 62.0 in | Wt 217.0 lb

## 2020-08-10 DIAGNOSIS — E1169 Type 2 diabetes mellitus with other specified complication: Secondary | ICD-10-CM

## 2020-08-10 DIAGNOSIS — D509 Iron deficiency anemia, unspecified: Secondary | ICD-10-CM

## 2020-08-10 DIAGNOSIS — T8484XD Pain due to internal orthopedic prosthetic devices, implants and grafts, subsequent encounter: Secondary | ICD-10-CM

## 2020-08-10 DIAGNOSIS — I10 Essential (primary) hypertension: Secondary | ICD-10-CM

## 2020-08-10 DIAGNOSIS — G43719 Chronic migraine without aura, intractable, without status migrainosus: Secondary | ICD-10-CM

## 2020-08-10 DIAGNOSIS — Z96652 Presence of left artificial knee joint: Secondary | ICD-10-CM

## 2020-08-10 DIAGNOSIS — E119 Type 2 diabetes mellitus without complications: Secondary | ICD-10-CM | POA: Diagnosis not present

## 2020-08-10 DIAGNOSIS — M25512 Pain in left shoulder: Secondary | ICD-10-CM

## 2020-08-10 DIAGNOSIS — E785 Hyperlipidemia, unspecified: Secondary | ICD-10-CM

## 2020-08-10 DIAGNOSIS — J4540 Moderate persistent asthma, uncomplicated: Secondary | ICD-10-CM

## 2020-08-10 MED ORDER — MONTELUKAST SODIUM 10 MG PO TABS: ORAL_TABLET | ORAL | 1 refills | Status: DC

## 2020-08-10 MED ORDER — ROSUVASTATIN CALCIUM 20 MG PO TABS: 20.0000 mg | ORAL_TABLET | Freq: Every day | ORAL | 3 refills | Status: DC

## 2020-08-10 NOTE — Patient Instructions (Addendum)
Okay to use tylenol 500 mg 1-2 tablets every 8 hours as needed for pain  To ice shoulder ~20-30 mins 3 times daily Can apply muscle rub after ice Aleve 1 tablet twice daily for 5 days. (with food)  Can use tylenol with aleve

## 2020-08-10 NOTE — Progress Notes (Signed)
Careteam: Patient Care Team: [name], NP as PCP - General (Geriatric Medicine) [name], MD as PCP - Cardiology (Cardiology) [name], MD as Consulting Physician (Neurology) [name], MD as Consulting Physician (Ophthalmology)  PLACE OF SERVICE:  [place] Directive information Does Patient Have a Medical Advance Directive?: Yes, Type of Advance Directive: [place];Living will, Does patient want to make changes to medical advance directive?: No - Patient declined  No Known Allergies  Chief Complaint  Patient presents with  . Medical Management of Chronic Issues    3 month follow up. Discuss need for Tetanus/Tdap vaccine and COVID booster.     HPI: Patient is a 65 y.o. female for follow up.  DM- eating a lot of starchy foods. Fasting labs are 140-180, did not bring blood sugar reading. Metformin is hard to swallow.  Drinking sweet tea, popcorn, potatoes.   Obesity- has gained weight- reports she eats a lot of "junk" food.   Headaches- controlled.   htn- eating foods high in sodium, continues on quinapril   OSA- on cpap nightly  Asthma- stable, does not need albuterol unless she has a cold.   OA- s/p left knee replacement - increase in fluid in left knee, attempted to aspirate under fluoro- pt reports they could not remove.   Left shoulder pain for 1 month 5/10 pain. Hurts with certain movements and has not improved. No numbness or tingling, no weakness. Has not tired anything to help with pain.   Review of Systems:  Review of Systems  Constitutional: Negative for chills, fever and weight loss.  HENT: Negative for tinnitus.   Respiratory: Negative for cough, sputum production and shortness of breath.   Cardiovascular: Negative for chest pain, palpitations and leg swelling.  Gastrointestinal: Negative for abdominal pain, constipation, diarrhea and heartburn.  Genitourinary: Negative for dysuria,  frequency and urgency.  Musculoskeletal: Positive for joint pain. Negative for back pain, falls and myalgias.  Skin: Negative.   Neurological: Negative for dizziness and headaches.  Psychiatric/Behavioral: Negative for depression and memory loss. The patient does not have insomnia.     Past Medical History:  Diagnosis Date  . Asthma    QVAR daily and Albuterol as needed  . Bladder infection    taking Keflex daily   . Chronic back pain    reason unknown  . Diabetes mellitus    takes Metformin and Tradjenta daily  . GERD (gastroesophageal reflux disease)    takes Omeprazole daily  . Hard of hearing   . Headache    daily.Takes Excedrine daily  . Hepatitis C   . History of blood transfusion    no abnormal reaction noted  . Hyperlipidemia    takes Atorvastatin daily  . Hypertension    takes Quinapril daily  . Joint pain   . Joint swelling   . Nocturia   . Obesity   . Osteoarthritis of left knee 02/23/2012  . Seasonal allergies    takes Claritin and Singulair daily.Nasal spray as needed  . Shortness of breath dyspnea    with exertion   Past Surgical History:  Procedure Laterality Date  . ABDOMINAL HYSTERECTOMY    . EYE SURGERY  2021  . INJECTION KNEE  04/09/2012   Procedure: KNEE INJECTION;  Surgeon: Johnny Bridge, MD;  Location: [facility];  Service: Orthopedics;  Laterality: Left;  . JOINT REPLACEMENT     acl   bil knees  . KNEE ARTHROTOMY  Left 08/28/2015   Procedure: LEFT KNEE ARTHROFIBROSIS EXCISION; pylectomy;  Surgeon: Meredith Pel, MD;  Location: [facility];  Service: Orthopedics;  Laterality: Left;  . KNEE CLOSED REDUCTION  04/09/2012   Procedure: CLOSED MANIPULATION KNEE;  Surgeon: Johnny Bridge, MD;  Location: [facility];  Service: Orthopedics;  Laterality: Left;  Manipulation Knee with Anesthesia includes Application of Traction   . TOTAL KNEE ARTHROPLASTY  02/23/2012   Procedure: TOTAL KNEE ARTHROPLASTY;  Surgeon: Johnny Bridge, MD;  Location: [facility];  Service: Orthopedics;  Laterality: Left;   Social History:   reports that she has never smoked. She has never used smokeless tobacco. She reports that she does not drink alcohol and does not use drugs.  Family History  Problem Relation Age of Onset  . Bronchitis Father 69  . Alzheimer's disease Mother   . Ovarian cancer Mother   . Stomach cancer Mother   . High blood pressure Sister   . High blood pressure Sister   . Headache Sister        pt thinks   . High blood pressure Sister   . High blood pressure Sister        pt thinks  . Breast cancer Sister   . High blood pressure Sister   . High blood pressure Sister   . Headache Daughter   . Diabetes Daughter   . Diabetes Daughter   . Diabetes Other   . Colon cancer Neg Hx   . Esophageal cancer Neg Hx   . Rectal cancer Neg Hx     Medications: Patient's Medications  New Prescriptions   No medications on file  Previous Medications   AMLODIPINE (NORVASC) 10 MG TABLET    Take 10 mg by mouth daily.   ASPIRIN EC 81 MG TABLET    Take 1 tablet (81 mg total) by mouth daily. Swallow whole.   AZELASTINE (ASTELIN) 0.1 % NASAL SPRAY    USE 1 SPRAY EACH NOSTRIL TWICE DAILY   BREO ELLIPTA 200-25 MCG/INH AEPB    INHALE 1 PUFF BY MOUTH EVERY DAY   CALCIUM CARB-CHOLECALCIFEROL (CALCIUM 600 + D PO)    Take by mouth.   DICLOFENAC SODIUM (VOLTAREN) 1 % GEL    Apply 8 g topically 2 (two) times daily as needed. On knees   FERROUS SULFATE 325 (65 FE) MG TABLET    TAKE 1 TABLET BY MOUTH EVERY DAY WITH BREAKFAST   FLUTICASONE (FLONASE) 50 MCG/ACT NASAL SPRAY    SPRAY 2 SPRAYS INTO EACH NOSTRIL EVERY DAY   GABAPENTIN (NEURONTIN) 100 MG CAPSULE    Take 100 mg by mouth 3 (three) times daily.   IPRATROPIUM (ATROVENT) 0.03 % NASAL SPRAY    PLACE 2 SPRAYS INTO BOTH NOSTRILS 2 (TWO) TIMES DAILY.   METFORMIN (GLUCOPHAGE) 1000 MG TABLET    Take one tablet by mouth in the morning and take One tablet by mouth in the evening.    MONTELUKAST (SINGULAIR) 10 MG TABLET    TAKE 1 TABLET BY MOUTH EVERYDAY AT BEDTIME   OMEPRAZOLE (PRILOSEC) 40 MG CAPSULE    TAKE 1 CAPSULE BY MOUTH EVERY DAY   ONE TOUCH ULTRA TEST TEST STRIP    Check blood sugar once daily as directed   PROAIR RESPICLICK 527 (90 BASE) MCG/ACT AEPB    INHALE 4 PUFFS INTO THE LUNGS EVERY 4 HOURS AS NEEDED   QUINAPRIL (ACCUPRIL) 40 MG TABLET    Take 1 tablet (40 mg total) by  mouth at bedtime.   ROSUVASTATIN (CRESTOR) 20 MG TABLET    Take 1 tablet (20 mg total) by mouth daily.   TOPIRAMATE (TOPAMAX) 100 MG TABLET    Take 100 mg by mouth at bedtime.  Modified Medications   No medications on file  Discontinued Medications   No medications on file    Physical Exam:  Vitals:   08/10/20 1011  BP: 140/80  Pulse: 77  Temp: (!) 97.1 F (36.2 C)  TempSrc: Temporal  SpO2: 98%  Weight: 217 lb (98.4 kg)  Height: 5\' 2"  (1.575 m)   Body mass index is 39.69 kg/m. Wt Readings from Last 3 Encounters:  08/10/20 217 lb (98.4 kg)  06/07/20 211 lb 12.8 oz (96.1 kg)  05/11/20 211 lb 6.4 oz (95.9 kg)    Physical Exam Constitutional:      General: She is not in acute distress.    Appearance: She is well-developed. She is not diaphoretic.  HENT:     Head: Normocephalic and atraumatic.     Mouth/Throat:     Pharynx: No oropharyngeal exudate.  Eyes:     Conjunctiva/sclera: Conjunctivae normal.     Pupils: Pupils are equal, round, and reactive to light.  Cardiovascular:     Rate and Rhythm: Normal rate and regular rhythm.     Heart sounds: Normal heart sounds.  Pulmonary:     Effort: Pulmonary effort is normal.     Breath sounds: Normal breath sounds.  Abdominal:     General: Bowel sounds are normal.     Palpations: Abdomen is soft.  Musculoskeletal:        General: Tenderness present.     Left shoulder: Tenderness present. Normal range of motion.     Cervical back: Normal range of motion and neck supple.     Comments: Knee brace to left knee  Skin:     General: Skin is warm and dry.  Neurological:     Mental Status: She is alert and oriented to person, place, and time.     Labs reviewed: Basic Metabolic Panel: Recent Labs    02/15/20 1027 05/11/20 1058  NA 140 137  K 4.2 4.4  CL 106 104  CO2 25 24  GLUCOSE 153* 118*  BUN 11 9  CREATININE 0.89 0.97  CALCIUM 10.1 10.3   Liver Function Tests: Recent Labs    02/15/20 1027 05/11/20 1058  AST 15 13  ALT 12 10  BILITOT 0.3 0.4  PROT 8.0 7.7   No results for input(s): LIPASE, AMYLASE in the last 8760 hours. No results for input(s): AMMONIA in the last 8760 hours. CBC: Recent Labs    02/15/20 1027 05/11/20 1058  WBC 9.4 7.4  NEUTROABS 5,894 4,536  HGB 11.5* 11.3*  HCT 35.2 34.8*  MCV 79.3* 78.7*  PLT 298 371   Lipid Panel: Recent Labs    02/15/20 1027 05/11/20 1058  CHOL 173 164  HDL 62 51  LDLCALC 92 94  TRIG 98 95  CHOLHDL 2.8 3.2   TSH: No results for input(s): TSH in the last 8760 hours. A1C: Lab Results  Component Value Date   HGBA1C 7.1 (H) 05/11/2020     Assessment/Plan 1. Hyperlipidemia associated with type 2 diabetes mellitus ([place]) -discussed dietary modifications, crestor was increased after last labs, will follow up labs at this time.  - rosuvastatin (CRESTOR) 20 MG tablet; Take 1 tablet (20 mg total) by mouth daily.  Dispense: 90 tablet; Refill: 3 - Lipid panel -  COMPLETE METABOLIC PANEL WITH GFR  2. Type 2 diabetes mellitus without complication, without long-term current use of insulin ([facility]) -blood sugars on recall are elevated, reports poor dietary choices. Encouraged dietary compliance, routine foot care/monitoring and to keep up with diabetic eye exams through ophthalmology. Continues on metformin, may need to add additional medication  - Hemoglobin A1c  3. Intractable chronic migraine without aura and without status migrainosus Stable, continues on topamax  4. Morbid obesity ([place]) Educated on importance of healthy weight with  healthy weight loss through dietary choices and exercise, she declined nutritional referral.   5. Essential hypertension, benign Educated on dietary modification with low sodium, dash diet.  Continues on norvasc 10 mg daily with quinapril 40 mg daily  - CBC with Differential/Platelet - COMPLETE METABOLIC PANEL WITH GFR  6. Iron deficiency anemia, unspecified iron deficiency anemia type -continues on supplement.  - CBC with Differential/Platelet  7. Moderate persistent asthma in adult without complication Well controlled on current regimen.   8. Pain due to total left knee replacement, subsequent encounter -followed by orthopedic specialist due to ongoing knee pain. Recommended pain management and PT. She is agreeable to PT at this time.  - Ambulatory referral to Physical Therapy  9. Acute pain of left shoulder -she is left handed.  -ice shoulder ~20-30 mins 3 times daily Can apply muscle rub after ice Aleve 1 tablet twice daily for 5 days. (with food)  Can use tylenol with aleve  Next appt: 3 months.  Carlos American. [facility] [phone]

## 2020-08-13 ENCOUNTER — Other Ambulatory Visit: Payer: Self-pay | Admitting: Neurology

## 2020-08-14 ENCOUNTER — Telehealth: Payer: Self-pay | Admitting: *Deleted

## 2020-08-14 MED ORDER — SITAGLIPTIN PHOSPHATE 100 MG PO TABS: 100.0000 mg | ORAL_TABLET | Freq: Every day | ORAL | 3 refills | Status: DC

## 2020-08-14 NOTE — Telephone Encounter (Signed)
-----   Message from [name], NP sent at 08/14/2020 10:48 AM EDT ----- A1c (and blood sugar) elevated at 7.3, hgb slightly lower- can we add on iron studies (%sat, ferritin, iron level, TIBC), lipids at goal To add januvia 100 mg daily to help with better diabetic control. Also continue to work on dite.

## 2020-08-14 NOTE — Telephone Encounter (Signed)
Patient notified and agreed.  Order given to Hassan Rowan to add on the Iron Studies.  Medication list updated and Rx sent to [facility] per patient.

## 2020-08-16 LAB — LIPID PANEL
Cholesterol: 140 mg/dL (ref <200)
HDL: 60 mg/dL (ref >=50)
LDL Cholesterol (Calc): 65 mg/dL (calc)
Non-HDL Cholesterol (Calc): 80 mg/dL (calc) (ref <130)
Total CHOL/HDL Ratio: 2.3 (calc) (ref <5.0)
Triglycerides: 66 mg/dL (ref <150)

## 2020-08-16 LAB — CBC WITH DIFFERENTIAL/PLATELET
Absolute Monocytes: 338 cells/uL (ref 200–950)
Basophils Absolute: 62 cells/uL (ref 0–200)
Basophils Relative: 0.9 %
Eosinophils Absolute: 324 cells/uL (ref 15–500)
Eosinophils Relative: 4.7 %
HCT: 32.5 % — ABNORMAL LOW (ref 35.0–45.0)
Hemoglobin: 10.6 g/dL — ABNORMAL LOW (ref 11.7–15.5)
Lymphs Abs: 2008 cells/uL (ref 850–3900)
MCH: 25.5 pg — ABNORMAL LOW (ref 27.0–33.0)
MCHC: 32.6 g/dL (ref 32.0–36.0)
MCV: 78.3 fL — ABNORMAL LOW (ref 80.0–100.0)
MPV: 11.4 fL (ref 7.5–12.5)
Monocytes Relative: 4.9 %
Neutro Abs: 4168 cells/uL (ref 1500–7800)
Neutrophils Relative %: 60.4 %
Platelets: 320 10*3/uL (ref 140–400)
RBC: 4.15 10*6/uL (ref 3.80–5.10)
RDW: 13.5 % (ref 11.0–15.0)
Total Lymphocyte: 29.1 %
WBC: 6.9 10*3/uL (ref 3.8–10.8)

## 2020-08-16 LAB — COMPLETE METABOLIC PANEL WITH GFR
AG Ratio: 1.5 (calc) (ref 1.0–2.5)
ALT: 8 U/L (ref 6–29)
AST: 11 U/L (ref 10–35)
Albumin: 4.4 g/dL (ref 3.6–5.1)
Alkaline phosphatase (APISO): 128 U/L (ref 37–153)
BUN: 8 mg/dL (ref 7–25)
CO2: 25 mmol/L (ref 20–32)
Calcium: 9.8 mg/dL (ref 8.6–10.4)
Chloride: 107 mmol/L (ref 98–110)
Creat: 0.87 mg/dL (ref 0.50–0.99)
GFR, Est African American: 81 mL/min/{1.73_m2} (ref >=60)
GFR, Est Non African American: 70 mL/min/{1.73_m2} (ref >=60)
Globulin: 3 g/dL (calc) (ref 1.9–3.7)
Glucose, Bld: 134 mg/dL — ABNORMAL HIGH (ref 65–99)
Potassium: 4.4 mmol/L (ref 3.5–5.3)
Sodium: 141 mmol/L (ref 135–146)
Total Bilirubin: 0.2 mg/dL (ref 0.2–1.2)
Total Protein: 7.4 g/dL (ref 6.1–8.1)

## 2020-08-16 LAB — TEST AUTHORIZATION

## 2020-08-16 LAB — IRON,TIBC AND FERRITIN PANEL
%SAT: 30 % (calc) (ref 16–45)
Ferritin: 105 ng/mL (ref 16–288)
Iron: 87 ug/dL (ref 45–160)
TIBC: 289 mcg/dL (calc) (ref 250–450)

## 2020-08-16 LAB — HEMOGLOBIN A1C
Hgb A1c MFr Bld: 7.3 % of total Hgb — ABNORMAL HIGH (ref <5.7)
Mean Plasma Glucose: 163 mg/dL
eAG (mmol/L): 9 mmol/L

## 2020-08-27 ENCOUNTER — Other Ambulatory Visit: Payer: Self-pay | Admitting: Allergy & Immunology

## 2020-09-03 ENCOUNTER — Ambulatory Visit: Payer: Medicare Other | Attending: Nurse Practitioner

## 2020-09-03 ENCOUNTER — Other Ambulatory Visit: Payer: Self-pay

## 2020-09-03 DIAGNOSIS — T8484XD Pain due to internal orthopedic prosthetic devices, implants and grafts, subsequent encounter: Secondary | ICD-10-CM | POA: Diagnosis present

## 2020-09-03 DIAGNOSIS — R2689 Other abnormalities of gait and mobility: Secondary | ICD-10-CM | POA: Insufficient documentation

## 2020-09-03 DIAGNOSIS — R262 Difficulty in walking, not elsewhere classified: Secondary | ICD-10-CM | POA: Insufficient documentation

## 2020-09-03 DIAGNOSIS — M25662 Stiffness of left knee, not elsewhere classified: Secondary | ICD-10-CM | POA: Diagnosis present

## 2020-09-03 DIAGNOSIS — Z96652 Presence of left artificial knee joint: Secondary | ICD-10-CM | POA: Diagnosis present

## 2020-09-03 DIAGNOSIS — G8929 Other chronic pain: Secondary | ICD-10-CM | POA: Diagnosis present

## 2020-09-03 DIAGNOSIS — M25562 Pain in left knee: Secondary | ICD-10-CM | POA: Insufficient documentation

## 2020-09-03 NOTE — Patient Instructions (Signed)

## 2020-09-04 ENCOUNTER — Other Ambulatory Visit: Payer: Self-pay | Admitting: Nurse Practitioner

## 2020-09-04 NOTE — Therapy (Signed)
[place], [place], [address] Phone: [phone]   Fax:  [phone]  Physical Therapy Evaluation  Patient Details  Name: Tabitha Oneal MRN: [id number] Date of Birth: 19-Jan-1956 Referring Provider (PT): [name], NP   Encounter Date: 09/03/2020   PT End of Session - 09/04/20 0601    Visit Number 2    Number of Visits 9    Date for PT Re-Evaluation 10/13/20    Authorization Type [employer] MEDICARE    PT Start Time 1046    PT Stop Time 1145    PT Time Calculation (min) 59 min    Equipment Utilized During Treatment Other (comment)   RW, soft hinged knee brace   Activity Tolerance Patient limited by pain    Behavior During Therapy [facility] for tasks assessed/performed           Past Medical History:  Diagnosis Date  . Asthma    QVAR daily and Albuterol as needed  . Bladder infection    taking Keflex daily   . Chronic back pain    reason unknown  . Diabetes mellitus    takes Metformin and Tradjenta daily  . GERD (gastroesophageal reflux disease)    takes Omeprazole daily  . Hard of hearing   . Headache    daily.Takes Excedrine daily  . Hepatitis C   . History of blood transfusion    no abnormal reaction noted  . Hyperlipidemia    takes Atorvastatin daily  . Hypertension    takes Quinapril daily  . Joint pain   . Joint swelling   . Nocturia   . Obesity   . Osteoarthritis of left knee 02/23/2012  . Seasonal allergies    takes Claritin and Singulair daily.Nasal spray as needed  . Shortness of breath dyspnea    with exertion    Past Surgical History:  Procedure Laterality Date  . ABDOMINAL HYSTERECTOMY    . EYE SURGERY  2021  . INJECTION KNEE  04/09/2012   Procedure: KNEE INJECTION;  Surgeon: Johnny Bridge, MD;  Location: [facility];  Service: Orthopedics;  Laterality: Left;  . JOINT REPLACEMENT     acl   bil knees  . KNEE ARTHROTOMY Left 08/28/2015   Procedure:  LEFT KNEE ARTHROFIBROSIS EXCISION; pylectomy;  Surgeon: Meredith Pel, MD;  Location: [facility];  Service: Orthopedics;  Laterality: Left;  . KNEE CLOSED REDUCTION  04/09/2012   Procedure: CLOSED MANIPULATION KNEE;  Surgeon: Johnny Bridge, MD;  Location: [place];  Service: Orthopedics;  Laterality: Left;  Manipulation Knee with Anesthesia includes Application of Traction   . TOTAL KNEE ARTHROPLASTY  02/23/2012   Procedure: TOTAL KNEE ARTHROPLASTY;  Surgeon: Johnny Bridge, MD;  Location: [place];  Service: Orthopedics;  Laterality: Left;    There were no vitals filed for this visit.    Subjective Assessment - 09/03/20 1100    Subjective Pt reports a Hx of chronic L knee pain with decreased ROM. Pt had L TKR surgery in 2013. Pt continues to be active reporting walking 3x per week for 30 to 45 mins with an AD. Pt reports a pain range of 6-10/10 for the L knee.    Pertinent History TKR 2013, obesity, DM2, chronic back pain    Limitations Standing;Walking;House hold activities    How long can you sit comfortably? not an issue    How long can you stand comfortably? 45 min  How long can you walk comfortably? 45 mins    Patient Stated Goals For the pain to get better    Currently in Pain? Yes    Pain Score 6    with pain medication   Pain Location Knee    Pain Orientation Left    Pain Descriptors / Indicators Throbbing;Aching;Sharp    Pain Type Chronic pain    Pain Onset Other (comment)   for years   Pain Frequency Constant    Aggravating Factors  Activity level    Pain Relieving Factors Rest, medication              [facility] PT Assessment - 09/04/20 0001      Assessment   Medical Diagnosis Pain due to total left knee replacement, subsequent encounter    Referring Provider (PT) [name], NP    Onset Date/Surgical Date --   TKR 2013   Hand Dominance Left    Prior Therapy yes      Precautions   Precautions None      Restrictions   Weight Bearing  Restrictions No      Balance Screen   Has the patient fallen in the past 6 months No      [place] residence    Living Arrangements Alone    Type of [place] Access Level entry    Home Layout One level      Prior Function   Level of Independence Independent with household mobility with device;Independent with community mobility with device      Cognition   Overall Cognitive Status Within Functional Limits for tasks assessed      Sensation   Light Touch Appears Intact      Posture/Postural Control   Posture/Postural Control Postural limitations    Postural Limitations Flexed trunk      ROM / Strength   AROM / PROM / Strength AROM;Strength      AROM   AROM Assessment Site Knee    Right/Left Knee Left    Left Knee Extension -35    Left Knee Flexion 35      Strength   Strength Assessment Site Hip;Ankle    Right/Left Hip Left    Left Hip Flexion 5/5    Left Hip Extension 5/5    Left Hip External Rotation 5/5    Left Hip Internal Rotation 5/5    Left Hip ABduction 5/5    Left Hip ADduction 5/5    Right/Left Ankle Left    Left Ankle Dorsiflexion 5/5    Left Ankle Plantar Flexion 5/5      Palpation   Palpation comment TTP along the medial L knee area      Transfers   Transfers Sit to Stand;Stand to Sit    Sit to Stand 6: Modified independent (Device/Increase time)      Ambulation/Gait   Ambulation/Gait Yes    Ambulation/Gait Assistance 6: Modified independent (Device/Increase time)    Assistive device 4-wheeled walker   L soft hinged knee brace   Gait Pattern Step-to pattern;Antalgic   over the L LE   Gait velocity Decreased                      Objective measurements completed on examination: See above findings.               PT Education - 09/04/20 0600    Education Details Eval findings,  POC, discussed trial use of a TENs unit for pain management    Person(s) Educated Patient     Methods Explanation;Demonstration;Handout    Comprehension Verbalized understanding;Need further instruction            PT Short Term Goals - 09/04/20 1601      PT SHORT TERM GOAL #1   Title Pt will report a reduction in L knee pain range to 4-7/10 with daily activities.    Baseline 6-10/10    Status New    Target Date 09/18/20             PT Long Term Goals - 09/04/20 0644      PT LONG TERM GOAL #1   Title Pt will report a reduction in L knee pain range to 3-5/10 with daily activities    Baseline 6-10/10    Status New    Target Date 10/13/20      PT LONG TERM GOAL #2   Title Pt will demonstarte an improved gait pattern becoming less antalgic in nature with an improved pace as demonstrated by a 2MWT    Baseline 2MWT to be completed    Status New    Target Date 10/13/20                  Plan - 09/04/20 0604    Clinical Impression Statement Pt presents to PT with a chronic Hx of L knee pain with fixed knee ROM at 35d. Pt walks c a RW with an antalgic gait pattern over the L LE. Pt's pain range is 6-10/10 and varies depending on activity level and her schedule for pain medication. Functionally, pt is mod Ind for ambulation c a rollator, in home and in the community. Pt remains active walking 30-45 mins 3x per week. Pt's goal is for the redution of L knee pain. Since 2017, the pt has been seen extensively by this clinic on 3 other occasions to address L knee pain, ROM, strength and function. Strength and functional gains have been achieved, but not re: ROM and pain. Today, PT discussed a trial use of a TENs unit to assess its assistance in the reduction of pain. Pt was provided information re: how to obtain should the trial be effective. Pt may benefit from PT 2w4 for pain reduction to improve pt's quality of life. With pt's chronic Hx of pain with her L knee, the expectation for the reduction of pain is low. Over the course of th PT evaluation, pt reported an increase in L  knee pain with her gait pattern becoming more antalgic. A cold pack was applied for 15 mins, pt reported a min decrease in pain and her gait pattern improved being less antalgic.    Personal Factors and Comorbidities Comorbidity 1;Comorbidity 2;Comorbidity 3+;Past/Current Experience;Time since onset of injury/illness/exacerbation    Comorbidities Pt reports a Hx of chronic L knee pain with decreased ROM. Pt had L TKR surgery in 2013. Pt continues to be active reporting walking 3x per week for 30 to 45 mins with an AD. Pt reports a pain range of 6-10/10 for the L knee.    Examination-Activity Limitations Bend;Carry;Lift;Locomotion Level;Squat;Stairs;Stand    Stability/Clinical Decision Making Evolving/Moderate complexity    Clinical Decision Making Moderate    Rehab Potential Fair    PT Frequency 2x / week    PT Duration 4 weeks    PT Treatment/Interventions ADLs/Self Care Home Management;Cryotherapy;Electrical Stimulation;Iontophoresis 4mg /ml Dexamethasone;Moist Heat;Patient/family education;Gait training;Stair training;Functional mobility training;Therapeutic activities;Therapeutic exercise;Taping  PT Next Visit Plan Trial for TENs unit. Assess 2MWT    Consulted and Agree with Plan of Care Patient           Patient will benefit from skilled therapeutic intervention in order to improve the following deficits and impairments:  Abnormal gait,Decreased range of motion,Difficulty walking,Obesity,Decreased activity tolerance,Pain,Decreased balance  Visit Diagnosis: Stiffness of left knee, not elsewhere classified  Chronic pain of left knee  Difficulty in walking, not elsewhere classified  Other abnormalities of gait and mobility  Pain due to total left knee replacement, subsequent encounter     Problem List Patient Active Problem List   Diagnosis Date Noted  . Pain in left knee 09/13/2019  . 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 Abnormal EKG  03/02/2019  . Incomplete left bundle branch block (LBBB) 03/02/2019  . Rebound headache 12/13/2018  . Overuse of medication 12/13/2018  . Intractable chronic migraine without aura and without status migrainosus 12/13/2018  . Microcytic anemia 02/24/2018  . High risk medication use 08/06/2017  . Seasonal and perennial allergic rhinitis 07/30/2017  . Recurrent infections 07/30/2017  . Cough 06/08/2017  . Moderate persistent asthma without complication 48/05/6551  . Chronic constipation 02/04/2017  . Hyperlipidemia due to type 2 diabetes mellitus ([place]) 02/04/2017  . Hallux rigidus, left foot 01/01/2017  . Postmenopausal symptoms 02/22/2016  . Arthrofibrosis of knee joint 08/28/2015  . Left hip pain 10/08/2014  . Type 2 diabetes mellitus without complication, without long-term current use of insulin ([place]) 10/08/2014  . Hyperlipidemia LDL goal <100 10/08/2014  . Essential hypertension, benign 08/30/2014  . Diabetes mellitus, type 2 ([place]) 08/30/2014  . Diverticulosis of colon without hemorrhage 08/30/2014  . Chronic low back pain 08/30/2014  . Chronic pain syndrome 08/30/2014  . Asthma in adult without complication 74/82/7078  . 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 Osteoarthritis of left knee 02/23/2012    Gar Ponto MS, PT 09/04/20 6:59 AM  [facility] [address] [place], [place], [address] Phone: [phone]   Fax:  [phone]  Name: Tabitha Oneal MRN: [id number] Date of Birth: [birth year]

## 2020-09-10 ENCOUNTER — Telehealth: Payer: Self-pay

## 2020-09-10 DIAGNOSIS — I10 Essential (primary) hypertension: Secondary | ICD-10-CM

## 2020-09-10 MED ORDER — QUINAPRIL HCL 40 MG PO TABS: 40.0000 mg | ORAL_TABLET | Freq: Every day | ORAL | 1 refills | Status: DC

## 2020-09-10 NOTE — Telephone Encounter (Signed)
Patient would like refill on medication

## 2020-09-21 ENCOUNTER — Ambulatory Visit: Payer: Medicare Other

## 2020-09-25 ENCOUNTER — Other Ambulatory Visit: Payer: Self-pay | Admitting: Nurse Practitioner

## 2020-09-26 ENCOUNTER — Ambulatory Visit: Payer: Medicare Other | Attending: Nurse Practitioner

## 2020-09-26 ENCOUNTER — Other Ambulatory Visit: Payer: Self-pay

## 2020-09-26 DIAGNOSIS — R262 Difficulty in walking, not elsewhere classified: Secondary | ICD-10-CM | POA: Diagnosis present

## 2020-09-26 DIAGNOSIS — T8484XD Pain due to internal orthopedic prosthetic devices, implants and grafts, subsequent encounter: Secondary | ICD-10-CM | POA: Insufficient documentation

## 2020-09-26 DIAGNOSIS — R2689 Other abnormalities of gait and mobility: Secondary | ICD-10-CM | POA: Diagnosis present

## 2020-09-26 DIAGNOSIS — M25662 Stiffness of left knee, not elsewhere classified: Secondary | ICD-10-CM | POA: Diagnosis not present

## 2020-09-26 DIAGNOSIS — Z96652 Presence of left artificial knee joint: Secondary | ICD-10-CM | POA: Diagnosis present

## 2020-09-26 DIAGNOSIS — M25562 Pain in left knee: Secondary | ICD-10-CM | POA: Diagnosis present

## 2020-09-26 DIAGNOSIS — G8929 Other chronic pain: Secondary | ICD-10-CM | POA: Insufficient documentation

## 2020-09-27 NOTE — Therapy (Signed)
[place], [place], [address] Phone: [phone]   Fax:  [phone]  Physical Therapy Treatment  Patient Details  Name: Tabitha Oneal MRN: [id number] Date of Birth: 1955-12-02 Referring Provider (PT): [name], NP   Encounter Date: 09/26/2020   PT End of Session - 09/26/20 1536    Visit Number 2    Number of Visits 9    Date for PT Re-Evaluation 10/13/20    Authorization Type UHC MEDICARE    PT Start Time 3546    PT Stop Time 1610    PT Time Calculation (min) 36 min    Equipment Utilized During Treatment Other (comment)    Activity Tolerance Patient tolerated treatment well    Behavior During Therapy [facility] for tasks assessed/performed           Past Medical History:  Diagnosis Date  . Asthma    QVAR daily and Albuterol as needed  . Bladder infection    taking Keflex daily   . Chronic back pain    reason unknown  . Diabetes mellitus    takes Metformin and Tradjenta daily  . GERD (gastroesophageal reflux disease)    takes Omeprazole daily  . Hard of hearing   . Headache    daily.Takes Excedrine daily  . Hepatitis C   . History of blood transfusion    no abnormal reaction noted  . Hyperlipidemia    takes Atorvastatin daily  . Hypertension    takes Quinapril daily  . Joint pain   . Joint swelling   . Nocturia   . Obesity   . Osteoarthritis of left knee 02/23/2012  . Seasonal allergies    takes Claritin and Singulair daily.Nasal spray as needed  . Shortness of breath dyspnea    with exertion    Past Surgical History:  Procedure Laterality Date  . ABDOMINAL HYSTERECTOMY    . EYE SURGERY  2021  . INJECTION KNEE  04/09/2012   Procedure: KNEE INJECTION;  Surgeon: Johnny Bridge, MD;  Location: [place];  Service: Orthopedics;  Laterality: Left;  . JOINT REPLACEMENT     acl   bil knees  . KNEE ARTHROTOMY Left 08/28/2015   Procedure: LEFT KNEE  ARTHROFIBROSIS EXCISION; pylectomy;  Surgeon: Meredith Pel, MD;  Location: [place];  Service: Orthopedics;  Laterality: Left;  . KNEE CLOSED REDUCTION  04/09/2012   Procedure: CLOSED MANIPULATION KNEE;  Surgeon: Johnny Bridge, MD;  Location: [place];  Service: Orthopedics;  Laterality: Left;  Manipulation Knee with Anesthesia includes Application of Traction   . TOTAL KNEE ARTHROPLASTY  02/23/2012   Procedure: TOTAL KNEE ARTHROPLASTY;  Surgeon: Johnny Bridge, MD;  Location: [place];  Service: Orthopedics;  Laterality: Left;    There were no vitals filed for this visit.   Subjective Assessment - 09/26/20 1543    Subjective Pt reports seeing Dr. Ronnie Derby over a week ago and he has referred her to a pain management clinic. Her appt is 5/11.    Patient Stated Goals For the pain to get better    Currently in Pain? Yes    Pain Score 7     Pain Location Knee    Pain Orientation Left    Pain Descriptors / Indicators Aching;Throbbing;Sharp    Pain Type Chronic pain    Pain Onset Other (comment)    Pain Frequency Constant  [facility] Adult PT Treatment/Exercise - 09/27/20 0001      Modalities   Modalities Electrical Stimulation      Electrical Stimulation   Electrical Stimulation Location L knee    Electrical Stimulation Action Conventional TENS    Electrical Stimulation Parameters 120hz , 40 ms, intensity to strong, but comfortable    Electrical Stimulation Goals Pain                    PT Short Term Goals - 09/04/20 6222      PT SHORT TERM GOAL #1   Title Pt will report a reduction in L knee pain range to 4-7/10 with daily activities.    Baseline 6-10/10    Status New    Target Date 09/18/20             PT Long Term Goals - 09/04/20 0644      PT LONG TERM GOAL #1   Title Pt will report a reduction in L knee pain range to 3-5/10 with daily activities    Baseline 6-10/10    Status New    Target Date  10/13/20      PT LONG TERM GOAL #2   Title Pt will demonstarte an improved gait pattern becoming less antalgic in nature with an improved pace as demonstrated by a 2MWT    Baseline 2MWT to be completed    Status New    Target Date 10/13/20                 Plan - 09/26/20 1537    Clinical Impression Statement A trail for the use of a TENS unit was completed today. A conventional TENS setting was used with an intesity as tolerated, strong, but comfortable. Following the session, pt reported a decrease in L knee pain to 5/10.    Personal Factors and Comorbidities Comorbidity 1;Comorbidity 2;Comorbidity 3+;Past/Current Experience;Time since onset of injury/illness/exacerbation    Comorbidities Pt reports a Hx of chronic L knee pain with decreased ROM. Pt had L TKR surgery in 2013. Pt continues to be active reporting walking 3x per week for 30 to 45 mins with an AD. Pt reports a pain range of 6-10/10 for the L knee.    Examination-Activity Limitations Bend;Carry;Lift;Locomotion Level;Squat;Stairs;Stand    Stability/Clinical Decision Making Evolving/Moderate complexity    Clinical Decision Making Moderate    Rehab Potential Fair    PT Frequency 2x / week    PT Duration 4 weeks    PT Treatment/Interventions ADLs/Self Care Home Management;Cryotherapy;Electrical Stimulation;Iontophoresis 4mg /ml Dexamethasone;Moist Heat;Patient/family education;Gait training;Stair training;Functional mobility training;Therapeutic activities;Therapeutic exercise;Taping    PT Next Visit Plan Continue with a trial of TENs. Will also complete with activity. Assess 2MWT.    Consulted and Agree with Plan of Care Patient           Patient will benefit from skilled therapeutic intervention in order to improve the following deficits and impairments:  Abnormal gait,Decreased range of motion,Difficulty walking,Obesity,Decreased activity tolerance,Pain,Decreased balance  Visit Diagnosis: Stiffness of left knee, not  elsewhere classified  Chronic pain of left knee  Difficulty in walking, not elsewhere classified  Other abnormalities of gait and mobility  Pain due to total left knee replacement, subsequent encounter     Problem List Patient Active Problem List   Diagnosis Date Noted  . Pain in left knee 09/13/2019  . 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 Abnormal EKG 03/02/2019  . Incomplete left bundle branch block (LBBB) 03/02/2019  .  Rebound headache 12/13/2018  . Overuse of medication 12/13/2018  . Intractable chronic migraine without aura and without status migrainosus 12/13/2018  . Microcytic anemia 02/24/2018  . High risk medication use 08/06/2017  . Seasonal and perennial allergic rhinitis 07/30/2017  . Recurrent infections 07/30/2017  . Cough 06/08/2017  . Moderate persistent asthma without complication 46/96/2952  . Chronic constipation 02/04/2017  . Hyperlipidemia due to type 2 diabetes mellitus ([place]) 02/04/2017  . Hallux rigidus, left foot 01/01/2017  . Postmenopausal symptoms 02/22/2016  . Arthrofibrosis of knee joint 08/28/2015  . Left hip pain 10/08/2014  . Type 2 diabetes mellitus without complication, without long-term current use of insulin ([place]) 10/08/2014  . Hyperlipidemia LDL goal <100 10/08/2014  . Essential hypertension, benign 08/30/2014  . Diabetes mellitus, type 2 ([place]) 08/30/2014  . Diverticulosis of colon without hemorrhage 08/30/2014  . Chronic low back pain 08/30/2014  . Chronic pain syndrome 08/30/2014  . Asthma in adult without complication 84/13/2440  . 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 Osteoarthritis of left knee 02/23/2012    Gar Ponto MS, PT 09/27/20 7:57 AM  [facility] [address] [place], [place], [address] Phone: [phone]   Fax:  [phone]  Name: Tabitha Oneal MRN: [id number] Date of Birth: 09-02-1955

## 2020-09-28 ENCOUNTER — Other Ambulatory Visit: Payer: Self-pay

## 2020-09-28 ENCOUNTER — Ambulatory Visit: Payer: Medicare Other

## 2020-09-28 DIAGNOSIS — Z96652 Presence of left artificial knee joint: Secondary | ICD-10-CM

## 2020-09-28 DIAGNOSIS — M25662 Stiffness of left knee, not elsewhere classified: Secondary | ICD-10-CM | POA: Diagnosis not present

## 2020-09-28 DIAGNOSIS — G8929 Other chronic pain: Secondary | ICD-10-CM

## 2020-09-28 DIAGNOSIS — T8484XD Pain due to internal orthopedic prosthetic devices, implants and grafts, subsequent encounter: Secondary | ICD-10-CM

## 2020-09-28 DIAGNOSIS — R262 Difficulty in walking, not elsewhere classified: Secondary | ICD-10-CM

## 2020-09-28 DIAGNOSIS — R2689 Other abnormalities of gait and mobility: Secondary | ICD-10-CM

## 2020-09-28 NOTE — Therapy (Signed)
[place], [place], [address] Phone: [phone]   Fax:  [phone]  Physical Therapy Treatment  Patient Details  Name: Tabitha Oneal MRN: [id number] Date of Birth: 02-Jul-1955 Referring Provider (PT): [name], NP   Encounter Date: 09/28/2020   PT End of Session - 09/28/20 1050    Visit Number 3    Number of Visits 9    Date for PT Re-Evaluation 10/13/20    Authorization Type UHC MEDICARE    PT Start Time 2694    PT Stop Time 1133    PT Time Calculation (min) 44 min    Equipment Utilized During Treatment Other (comment)   rollator   Activity Tolerance Patient tolerated treatment well    Behavior During Therapy [facility] for tasks assessed/performed           Past Medical History:  Diagnosis Date  . Asthma    QVAR daily and Albuterol as needed  . Bladder infection    taking Keflex daily   . Chronic back pain    reason unknown  . Diabetes mellitus    takes Metformin and Tradjenta daily  . GERD (gastroesophageal reflux disease)    takes Omeprazole daily  . Hard of hearing   . Headache    daily.Takes Excedrine daily  . Hepatitis C   . History of blood transfusion    no abnormal reaction noted  . Hyperlipidemia    takes Atorvastatin daily  . Hypertension    takes Quinapril daily  . Joint pain   . Joint swelling   . Nocturia   . Obesity   . Osteoarthritis of left knee 02/23/2012  . Seasonal allergies    takes Claritin and Singulair daily.Nasal spray as needed  . Shortness of breath dyspnea    with exertion    Past Surgical History:  Procedure Laterality Date  . ABDOMINAL HYSTERECTOMY    . EYE SURGERY  2021  . INJECTION KNEE  04/09/2012   Procedure: KNEE INJECTION;  Surgeon: Johnny Bridge, MD;  Location: [place];  Service: Orthopedics;  Laterality: Left;  . JOINT REPLACEMENT     acl   bil knees  . KNEE ARTHROTOMY Left 08/28/2015   Procedure: LEFT KNEE  ARTHROFIBROSIS EXCISION; pylectomy;  Surgeon: Meredith Pel, MD;  Location: [place];  Service: Orthopedics;  Laterality: Left;  . KNEE CLOSED REDUCTION  04/09/2012   Procedure: CLOSED MANIPULATION KNEE;  Surgeon: Johnny Bridge, MD;  Location: [place];  Service: Orthopedics;  Laterality: Left;  Manipulation Knee with Anesthesia includes Application of Traction   . TOTAL KNEE ARTHROPLASTY  02/23/2012   Procedure: TOTAL KNEE ARTHROPLASTY;  Surgeon: Johnny Bridge, MD;  Location: [place];  Service: Orthopedics;  Laterality: Left;    There were no vitals filed for this visit.   Subjective Assessment - 09/28/20 1110    Subjective Pt reports her L knee pain at rest is a 5/10 and with walking a 8/10    Limitations Standing;Walking;House hold activities    Patient Stated Goals For the pain to get better    Currently in Pain? Yes    Pain Score 8    5/10 ar rest   Pain Location Knee    Pain Orientation Left    Pain Descriptors / Indicators Aching;Throbbing;Sharp    Pain Type Chronic pain    Pain Onset Other (comment)    Pain Frequency Constant    Aggravating Factors  Activity level    Pain Relieving Factors Rest, medication                             [facility] Adult PT Treatment/Exercise - 09/28/20 0001      Ambulation/Gait   Gait Comments Gait will rolator for trial with TENS to assess assist with pain reduction during WBing activity of the L LE. Pt walked 163ft at a slow pace.      Modalities   Modalities Teacher, English as a foreign language Location L knee    Electrical Stimulation Action MRW    Electrical Stimulation Goals Pain                  PT Education - 09/28/20 1134    Education Details Pt was provided info on where to obtain a TENS unit.    Person(s) Educated Patient    Methods Explanation;Demonstration;Handout    Comprehension Verbalized understanding            PT Short Term Goals  - 09/28/20 1151      PT SHORT TERM GOAL #1   Title Pt will report a reduction in L knee pain range to 4-7/10 with daily activities. 09/28/20: TENS unit appears to be helpful with L knee pain management    Baseline 6-10/10    Status On-going    Target Date 10/05/20             PT Long Term Goals - 09/04/20 0644      PT LONG TERM GOAL #1   Title Pt will report a reduction in L knee pain range to 3-5/10 with daily activities    Baseline 6-10/10    Status New    Target Date 10/13/20      PT LONG TERM GOAL #2   Title Pt will demonstarte an improved gait pattern becoming less antalgic in nature with an improved pace as demonstrated by a 2MWT    Baseline 2MWT to be completed    Status New    Target Date 10/13/20                 Plan - 09/28/20 1051    Clinical Impression Statement Continued trial with TENS with setting on MRW and intensity adjusted to strong, but comfortable. 10 mins. after application, pt reported a reduction of L knee pain from 5/10 to 1/10. Pt then walked 185 ft x a RW at a slow pace, which is her normal pace. Following the walk, pt reported her L knee pain at a 5/10 level which was decreased in comparison to reported pain level with walking when she arrived for her appt today. Discussed with pt the possible use of a TENS unit which she could purchase to help with the management of her L knee pain. Pt stated she would like to think about it and would also like to attend her pain management appt. prior to purchasing. Pt is to return to PT on 10/05/20. One more trial will be completed at that time to assist pt in determining if the use of a TENS unit will be beneficial in the pain management of her L knee.    Personal Factors and Comorbidities Comorbidity 1;Comorbidity 2;Comorbidity 3+;Past/Current Experience;Time since onset of injury/illness/exacerbation    Comorbidities Pt reports a Hx of chronic L knee pain with decreased ROM. Pt had L TKR surgery in 2013. Pt  continues  to be active reporting walking 3x per week for 30 to 45 mins with an AD. Pt reports a pain range of 6-10/10 for the L knee.    Examination-Activity Limitations Bend;Carry;Lift;Locomotion Level;Squat;Stairs;Stand    Stability/Clinical Decision Making Evolving/Moderate complexity    Clinical Decision Making Moderate    Rehab Potential Fair    PT Frequency 2x / week    PT Duration 4 weeks    PT Treatment/Interventions ADLs/Self Care Home Management;Cryotherapy;Electrical Stimulation;Iontophoresis 4mg /ml Dexamethasone;Moist Heat;Patient/family education;Gait training;Stair training;Functional mobility training;Therapeutic activities;Therapeutic exercise;Taping    PT Next Visit Plan Continue with a trial of TENs. Will also complete with activity.    Consulted and Agree with Plan of Care Patient           Patient will benefit from skilled therapeutic intervention in order to improve the following deficits and impairments:  Abnormal gait,Decreased range of motion,Difficulty walking,Obesity,Decreased activity tolerance,Pain,Decreased balance  Visit Diagnosis: Stiffness of left knee, not elsewhere classified  Chronic pain of left knee  Difficulty in walking, not elsewhere classified  Other abnormalities of gait and mobility  Pain due to total left knee replacement, subsequent encounter     Problem List Patient Active Problem List   Diagnosis Date Noted  . Pain in left knee 09/13/2019  . 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 Abnormal EKG 03/02/2019  . Incomplete left bundle branch block (LBBB) 03/02/2019  . Rebound headache 12/13/2018  . Overuse of medication 12/13/2018  . Intractable chronic migraine without aura and without status migrainosus 12/13/2018  . Microcytic anemia 02/24/2018  . High risk medication use 08/06/2017  . Seasonal and perennial allergic rhinitis 07/30/2017  . Recurrent infections 07/30/2017  . Cough 06/08/2017  .  Moderate persistent asthma without complication 48/88/9169  . Chronic constipation 02/04/2017  . Hyperlipidemia due to type 2 diabetes mellitus ([place]) 02/04/2017  . Hallux rigidus, left foot 01/01/2017  . Postmenopausal symptoms 02/22/2016  . Arthrofibrosis of knee joint 08/28/2015  . Left hip pain 10/08/2014  . Type 2 diabetes mellitus without complication, without long-term current use of insulin ([place]) 10/08/2014  . Hyperlipidemia LDL goal <100 10/08/2014  . Essential hypertension, benign 08/30/2014  . Diabetes mellitus, type 2 ([place]) 08/30/2014  . Diverticulosis of colon without hemorrhage 08/30/2014  . Chronic low back pain 08/30/2014  . Chronic pain syndrome 08/30/2014  . Asthma in adult without complication 45/07/8880  . 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 Osteoarthritis of left knee 02/23/2012   Gar Ponto MS, PT 09/28/20 12:00 PM  [facility] [address] [place], [place], [address] Phone: [phone]   Fax:  [phone]  Name: Tabitha Oneal MRN: [id number] Date of Birth: 04/29/1956

## 2020-09-28 NOTE — Patient Instructions (Signed)

## 2020-10-03 ENCOUNTER — Ambulatory Visit: Payer: Medicare Other

## 2020-10-05 ENCOUNTER — Ambulatory Visit: Payer: Medicare Other

## 2020-10-05 ENCOUNTER — Other Ambulatory Visit: Payer: Self-pay

## 2020-10-05 DIAGNOSIS — R262 Difficulty in walking, not elsewhere classified: Secondary | ICD-10-CM

## 2020-10-05 DIAGNOSIS — T8484XD Pain due to internal orthopedic prosthetic devices, implants and grafts, subsequent encounter: Secondary | ICD-10-CM

## 2020-10-05 DIAGNOSIS — M25662 Stiffness of left knee, not elsewhere classified: Secondary | ICD-10-CM

## 2020-10-05 DIAGNOSIS — M25562 Pain in left knee: Secondary | ICD-10-CM

## 2020-10-05 DIAGNOSIS — R2689 Other abnormalities of gait and mobility: Secondary | ICD-10-CM

## 2020-10-05 NOTE — Therapy (Signed)
[place], [place], [address] Phone: [phone]   Fax:  [phone]  Physical Therapy Treatment/Discharge  Patient Details  Name: Tabitha Oneal MRN: [id number] Date of Birth: 10-25-1955 Referring Provider (PT): [name], NP   Encounter Date: 10/05/2020   PT End of Session - 10/05/20 1051    Visit Number 3    Number of Visits 9    Date for PT Re-Evaluation 10/13/20    Authorization Type UHC MEDICARE    PT Start Time --    PT Stop Time --    PT Time Calculation (min) --    Equipment Utilized During Treatment --    Activity Tolerance --    Behavior During Therapy --           Past Medical History:  Diagnosis Date  . Asthma    QVAR daily and Albuterol as needed  . Bladder infection    taking Keflex daily   . Chronic back pain    reason unknown  . Diabetes mellitus    takes Metformin and Tradjenta daily  . GERD (gastroesophageal reflux disease)    takes Omeprazole daily  . Hard of hearing   . Headache    daily.Takes Excedrine daily  . Hepatitis C   . History of blood transfusion    no abnormal reaction noted  . Hyperlipidemia    takes Atorvastatin daily  . Hypertension    takes Quinapril daily  . Joint pain   . Joint swelling   . Nocturia   . Obesity   . Osteoarthritis of left knee 02/23/2012  . Seasonal allergies    takes Claritin and Singulair daily.Nasal spray as needed  . Shortness of breath dyspnea    with exertion    Past Surgical History:  Procedure Laterality Date  . ABDOMINAL HYSTERECTOMY    . EYE SURGERY  2021  . INJECTION KNEE  04/09/2012   Procedure: KNEE INJECTION;  Surgeon: Johnny Bridge, MD;  Location: [place];  Service: Orthopedics;  Laterality: Left;  . JOINT REPLACEMENT     acl   bil knees  . KNEE ARTHROTOMY Left 08/28/2015   Procedure: LEFT KNEE ARTHROFIBROSIS EXCISION; pylectomy;  Surgeon: Meredith Pel, MD;  Location:  [place];  Service: Orthopedics;  Laterality: Left;  . KNEE CLOSED REDUCTION  04/09/2012   Procedure: CLOSED MANIPULATION KNEE;  Surgeon: Johnny Bridge, MD;  Location: [place];  Service: Orthopedics;  Laterality: Left;  Manipulation Knee with Anesthesia includes Application of Traction   . TOTAL KNEE ARTHROPLASTY  02/23/2012   Procedure: TOTAL KNEE ARTHROPLASTY;  Surgeon: Johnny Bridge, MD;  Location: [place];  Service: Orthopedics;  Laterality: Left;    There were no vitals filed for this visit.   Subjective Assessment - 10/05/20 1111    Subjective Pt reports she went to the pain management clinic earlier this week and she is scheduled for a nerve block of the L knee on 11/06/20.                                       PT Short Term Goals - 10/05/20 1122      PT SHORT TERM GOAL #1   Title Pt will report a reduction in L knee pain range to 4-7/10 with daily activities. 09/28/20: TENS unit appears to be helpful with L  knee pain management    Baseline 6-10/10    Status Deferred    Target Date 10/05/20             PT Long Term Goals - 10/05/20 1123      PT LONG TERM GOAL #1   Title Pt will report a reduction in L knee pain range to 3-5/10 with daily activities    Baseline 6-10/10    Status Deferred    Target Date 10/05/20      PT LONG TERM GOAL #2   Title Pt will demonstarte an improved gait pattern becoming less antalgic in nature with an improved pace as demonstrated by a 2MWT    Baseline 2MWT to be completed    Status Deferred    Target Date 10/05/20                 Plan - 10/05/20 1052    Clinical Impression Statement Pt present to PT reporting with her pain management earlier this week she is scheduled for a nerve block for her L knee on 11/06/20. With this upcoming procedure to address her knee pain, PT and the trial for use of TENS for the same purpose will be DCed. Pt is in aggreement c DC.    Personal Factors and  Comorbidities --    Comorbidities --    Examination-Activity Limitations --    Stability/Clinical Decision Making --    Clinical Decision Making --    Rehab Potential --    PT Frequency --    PT Duration --    PT Treatment/Interventions --    PT Next Visit Plan --    Consulted and Agree with Plan of Care Patient           Patient will benefit from skilled therapeutic intervention in order to improve the following deficits and impairments:     Visit Diagnosis: Stiffness of left knee, not elsewhere classified  Chronic pain of left knee  Difficulty in walking, not elsewhere classified  Other abnormalities of gait and mobility  Pain due to total left knee replacement, subsequent encounter     Problem List Patient Active Problem List   Diagnosis Date Noted  . Pain in left knee 09/13/2019  . 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 Abnormal EKG 03/02/2019  . Incomplete left bundle branch block (LBBB) 03/02/2019  . Rebound headache 12/13/2018  . Overuse of medication 12/13/2018  . Intractable chronic migraine without aura and without status migrainosus 12/13/2018  . Microcytic anemia 02/24/2018  . High risk medication use 08/06/2017  . Seasonal and perennial allergic rhinitis 07/30/2017  . Recurrent infections 07/30/2017  . Cough 06/08/2017  . Moderate persistent asthma without complication 58/59/2924  . Chronic constipation 02/04/2017  . Hyperlipidemia due to type 2 diabetes mellitus ([place]) 02/04/2017  . Hallux rigidus, left foot 01/01/2017  . Postmenopausal symptoms 02/22/2016  . Arthrofibrosis of knee joint 08/28/2015  . Left hip pain 10/08/2014  . Type 2 diabetes mellitus without complication, without long-term current use of insulin ([place]) 10/08/2014  . Hyperlipidemia LDL goal <100 10/08/2014  . Essential hypertension, benign 08/30/2014  . Diabetes mellitus, type 2 ([place]) 08/30/2014  . Diverticulosis of colon without hemorrhage  08/30/2014  . Chronic low back pain 08/30/2014  . Chronic pain syndrome 08/30/2014  . Asthma in adult without complication 46/28/6381  . 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 Osteoarthritis of left knee 02/23/2012   PHYSICAL THERAPY DISCHARGE SUMMARY  Visits from Start of Care: 3  Current functional level related to goals / functional outcomes: See above   Remaining deficits: See above   Education / Equipment: HEP  Plan: Patient agrees to discharge.  Patient goals were not met. Patient is being discharged due to a change in medical status.  ?????          Pt is to undergo a nerve block for the L knee on 11/06/20.  Gar Ponto MS, PT 10/05/20 11:25 AM  [facility] [address] [place], [place], [address] Phone: [phone]   Fax:  [phone]  Name: Tabitha Oneal MRN: [id number] Date of Birth: [birth year]

## 2020-10-08 ENCOUNTER — Telehealth: Payer: Self-pay | Admitting: Cardiology

## 2020-10-08 NOTE — Telephone Encounter (Signed)
Pt. Came into our office today. She says that she would like to switch to a Dr. In our office on [address]. She says that because of transportation, she will not be able to travel to the [place] location. She would like to be referred to one of our Drs. Here at [address]. Patient is requesting a call back from the nurse.

## 2020-10-10 ENCOUNTER — Telehealth: Payer: Self-pay

## 2020-10-10 ENCOUNTER — Ambulatory Visit: Payer: Medicare Other

## 2020-10-10 ENCOUNTER — Other Ambulatory Visit: Payer: Self-pay | Admitting: Nurse Practitioner

## 2020-10-10 NOTE — Telephone Encounter (Signed)
Left message on voicemail for patient to return call when available , reason for call: Inform patient placard completed and available. I need to clarify how patient would like to retrieve, mail or pick-up  Placard is turned over on the clinical intake desk, copy already sent for scanning  Awaiting return call

## 2020-10-10 NOTE — Telephone Encounter (Signed)
Patient has request refill on medication "Gabapentin". Patient last refill was 08/15/2019. Medication pend and sent to PCP [name], NP . Please Advise.

## 2020-10-10 NOTE — Telephone Encounter (Signed)
Ok by me, whatever works best for her transportation.

## 2020-10-12 ENCOUNTER — Ambulatory Visit: Payer: Medicare Other

## 2020-10-12 NOTE — Telephone Encounter (Signed)
Patient called and was told form was ready. Patient is coming to pick form up. Form placed upfront in administrative draw to be picked up.

## 2020-11-05 ENCOUNTER — Other Ambulatory Visit: Payer: Self-pay | Admitting: Allergy & Immunology

## 2020-11-06 HISTORY — PX: NERVE SURGERY: SHX1016

## 2020-11-12 ENCOUNTER — Encounter: Payer: Self-pay | Admitting: Nurse Practitioner

## 2020-11-12 ENCOUNTER — Other Ambulatory Visit: Payer: Self-pay

## 2020-11-12 ENCOUNTER — Ambulatory Visit (INDEPENDENT_AMBULATORY_CARE_PROVIDER_SITE_OTHER): Payer: Medicare Other | Admitting: Nurse Practitioner

## 2020-11-12 VITALS — BP 110/64 | HR 74 | Temp 97.5°F | Ht 62.0 in | Wt 203.0 lb

## 2020-11-12 DIAGNOSIS — D509 Iron deficiency anemia, unspecified: Secondary | ICD-10-CM | POA: Diagnosis not present

## 2020-11-12 DIAGNOSIS — E785 Hyperlipidemia, unspecified: Secondary | ICD-10-CM

## 2020-11-12 DIAGNOSIS — J4540 Moderate persistent asthma, uncomplicated: Secondary | ICD-10-CM

## 2020-11-12 DIAGNOSIS — E1169 Type 2 diabetes mellitus with other specified complication: Secondary | ICD-10-CM | POA: Diagnosis not present

## 2020-11-12 DIAGNOSIS — E119 Type 2 diabetes mellitus without complications: Secondary | ICD-10-CM

## 2020-11-12 DIAGNOSIS — M1712 Unilateral primary osteoarthritis, left knee: Secondary | ICD-10-CM

## 2020-11-12 DIAGNOSIS — I10 Essential (primary) hypertension: Secondary | ICD-10-CM | POA: Diagnosis not present

## 2020-11-12 NOTE — Patient Instructions (Signed)
DUE TDAP, Shingrix and COVID vaccine booster- to get at the local pharmacy.

## 2020-11-12 NOTE — Progress Notes (Signed)
Careteam: Patient Care Team: Sharon Seller, NP as PCP - General (Geriatric Medicine) Jodelle Red, MD as PCP - Cardiology (Cardiology) Anson Fret, MD as Consulting Physician (Neurology) Ernesto Rutherford, MD as Consulting Physician (Ophthalmology)  PLACE OF SERVICE:  [facility]  Advanced Directive information Does Patient Have a Medical Advance Directive?: No, Would patient like information on creating a medical advance directive?: Yes (MAU/Ambulatory/Procedural Areas - Information given)  No Known Allergies  Chief Complaint  Patient presents with   Medical Management of Chronic Issues    3 month follow-up and discuss need for shingrix, TD/tdap, and covid vaccines (not in [facility])      HPI: Patient is a 65 y.o. female for routine follow up.   DM- added januvia at last visit. Denies hypoglycemia.   Obesity- working on weight loss. Lost 14 lbs with cutting back on fried food and snacky food. Drinking more water.   Htn-blood pressure controlled.   OA s/p left knee replacement with chronic pain and immobility- followed by ortho, having PT and pain management.   Anemia- using iron supplement, no constipation.   Review of Systems:  Review of Systems  Constitutional:  Negative for chills, fever and weight loss.  HENT:  Negative for tinnitus.   Respiratory:  Negative for cough, sputum production and shortness of breath.   Cardiovascular:  Negative for chest pain, palpitations and leg swelling.  Gastrointestinal:  Negative for abdominal pain, constipation, diarrhea and heartburn.  Genitourinary:  Negative for dysuria, frequency and urgency.  Musculoskeletal:  Positive for joint pain and myalgias. Negative for back pain and falls.  Skin: Negative.   Neurological:  Negative for dizziness and headaches.  Psychiatric/Behavioral:  Negative for depression and memory loss. The patient does not have insomnia.    Past Medical History:  Diagnosis Date   Asthma     QVAR daily and Albuterol as needed   Bladder infection    taking Keflex daily    Chronic back pain    reason unknown   Diabetes mellitus    takes Metformin and Tradjenta daily   GERD (gastroesophageal reflux disease)    takes Omeprazole daily   Hard of hearing    Headache    daily.Takes Excedrine daily   Hepatitis C    History of blood transfusion    no abnormal reaction noted   Hyperlipidemia    takes Atorvastatin daily   Hypertension    takes Quinapril daily   Joint pain    Joint swelling    Nocturia    Obesity    Osteoarthritis of left knee 02/23/2012   Seasonal allergies    takes Claritin and Singulair daily.Nasal spray as needed   Shortness of breath dyspnea    with exertion   Past Surgical History:  Procedure Laterality Date   ABDOMINAL HYSTERECTOMY     EYE SURGERY  2021   INJECTION KNEE  04/09/2012   Procedure: KNEE INJECTION;  Surgeon: Eulas Post, MD;  Location: [facility];  Service: Orthopedics;  Laterality: Left;   JOINT REPLACEMENT     acl   bil knees   KNEE ARTHROTOMY Left 08/28/2015   Procedure: LEFT KNEE ARTHROFIBROSIS EXCISION; pylectomy;  Surgeon: Cammy Copa, MD;  Location: [facility];  Service: Orthopedics;  Laterality: Left;   KNEE CLOSED REDUCTION  04/09/2012   Procedure: CLOSED MANIPULATION KNEE;  Surgeon: Eulas Post, MD;  Location: [facility];  Service: Orthopedics;  Laterality: Left;  Manipulation Knee with  Anesthesia includes Application of Traction    NERVE SURGERY Left 11/06/2020   Per patient on left knee   TOTAL KNEE ARTHROPLASTY  02/23/2012   Procedure: TOTAL KNEE ARTHROPLASTY;  Surgeon: Johnny Bridge, MD;  Location: [facility];  Service: Orthopedics;  Laterality: Left;   Social History:   reports that she has never smoked. She has never used smokeless tobacco. She reports that she does not drink alcohol and does not use drugs.  Family History  Problem Relation Age of Onset   Bronchitis Father 55    Alzheimer's disease Mother    Ovarian cancer Mother    Stomach cancer Mother    High blood pressure Sister    High blood pressure Sister    Headache Sister        pt thinks    High blood pressure Sister    High blood pressure Sister        pt thinks   Breast cancer Sister    High blood pressure Sister    High blood pressure Sister    Headache Daughter    Diabetes Daughter    Diabetes Daughter    Diabetes Other    Colon cancer Neg Hx    Esophageal cancer Neg Hx    Rectal cancer Neg Hx     Medications: Patient's Medications  New Prescriptions   No medications on file  Previous Medications   AMLODIPINE (NORVASC) 10 MG TABLET    Take 10 mg by mouth daily.   ASPIRIN EC 81 MG TABLET    Take 1 tablet (81 mg total) by mouth daily. Swallow whole.   AZELASTINE (ASTELIN) 0.1 % NASAL SPRAY    USE 1 SPRAY EACH NOSTRIL TWICE DAILY   CALCIUM CARB-CHOLECALCIFEROL (CALCIUM 600 + D PO)    Take by mouth.   DICLOFENAC SODIUM (VOLTAREN) 1 % GEL    Apply 8 g topically 2 (two) times daily as needed. On knees   FERROUS SULFATE 325 (65 FE) MG TABLET    TAKE 1 TABLET BY MOUTH EVERY DAY WITH BREAKFAST   FLUTICASONE (FLONASE) 50 MCG/ACT NASAL SPRAY    SPRAY 2 SPRAYS INTO EACH NOSTRIL EVERY DAY   FLUTICASONE FUROATE-VILANTEROL (BREO ELLIPTA) 200-25 MCG/INH AEPB    INHALE 1 PUFF BY MOUTH EVERY DAY   GABAPENTIN (NEURONTIN) 100 MG CAPSULE    TAKE 1 CAPSULE (100 MG) BY ORAL ROUTE 3 TIMES PER DAY FOR 90 DAYS   IPRATROPIUM (ATROVENT) 0.03 % NASAL SPRAY    Place 1 spray into both nostrils every 6 (six) hours as needed.   METFORMIN (GLUCOPHAGE) 1000 MG TABLET    Take one tablet by mouth in the morning and take One tablet by mouth in the evening.   MONTELUKAST (SINGULAIR) 10 MG TABLET    One at bedtime   OMEPRAZOLE (PRILOSEC) 40 MG CAPSULE    TAKE 1 CAPSULE BY MOUTH EVERY DAY   ONE TOUCH ULTRA TEST TEST STRIP    Check blood sugar once daily as directed   PROAIR RESPICLICK 027 (90 BASE) MCG/ACT AEPB    INHALE 4  PUFFS INTO THE LUNGS EVERY 4 HOURS AS NEEDED   QUINAPRIL (ACCUPRIL) 40 MG TABLET    Take 1 tablet (40 mg total) by mouth at bedtime.   ROSUVASTATIN (CRESTOR) 10 MG TABLET    TAKE 1 TABLET BY MOUTH EVERY DAY   ROSUVASTATIN (CRESTOR) 20 MG TABLET    Take 1 tablet (20 mg total) by mouth daily.   SITAGLIPTIN (JANUVIA)  100 MG TABLET    Take 1 tablet (100 mg total) by mouth daily.   TOPIRAMATE (TOPAMAX) 100 MG TABLET    TAKE 1 TABLET BY MOUTH EVERYDAY AT BEDTIME  Modified Medications   No medications on file  Discontinued Medications   No medications on file    Physical Exam:  Vitals:   11/12/20 1033  BP: 110/64  Pulse: 74  Temp: (!) 97.5 F (36.4 C)  TempSrc: Temporal  SpO2: 98%  Weight: 203 lb (92.1 kg)  Height: $Remove'5\' 2"'EERICHW$  (1.575 m)   Body mass index is 37.13 kg/m. Wt Readings from Last 3 Encounters:  11/12/20 203 lb (92.1 kg)  08/10/20 217 lb (98.4 kg)  06/07/20 211 lb 12.8 oz (96.1 kg)    Physical Exam Constitutional:      General: She is not in acute distress.    Appearance: She is well-developed. She is not diaphoretic.  HENT:     Head: Normocephalic and atraumatic.     Mouth/Throat:     Pharynx: No oropharyngeal exudate.  Eyes:     Conjunctiva/sclera: Conjunctivae normal.     Pupils: Pupils are equal, round, and reactive to light.  Cardiovascular:     Rate and Rhythm: Normal rate and regular rhythm.     Heart sounds: Normal heart sounds.  Pulmonary:     Effort: Pulmonary effort is normal.     Breath sounds: Normal breath sounds.  Abdominal:     General: Bowel sounds are normal.     Palpations: Abdomen is soft.  Musculoskeletal:     Cervical back: Normal range of motion and neck supple.     Right lower leg: No edema.     Left lower leg: No edema.     Comments: Limited ROM to left knee.   Skin:    General: Skin is warm and dry.  Neurological:     Mental Status: She is alert.  Psychiatric:        Mood and Affect: Mood normal.    Labs reviewed: Basic  Metabolic Panel: Recent Labs    02/15/20 1027 05/11/20 1058 08/10/20 1045  NA 140 137 141  K 4.2 4.4 4.4  CL 106 104 107  CO2 $Re'25 24 25  'vWE$ GLUCOSE 153* 118* 134*  BUN $Re'11 9 8  'HNY$ CREATININE 0.89 0.97 0.87  CALCIUM 10.1 10.3 9.8   Liver Function Tests: Recent Labs    02/15/20 1027 05/11/20 1058 08/10/20 1045  AST $Re'15 13 11  'jfn$ ALT $R'12 10 8  'dm$ BILITOT 0.3 0.4 0.2  PROT 8.0 7.7 7.4   No results for input(s): LIPASE, AMYLASE in the last 8760 hours. No results for input(s): AMMONIA in the last 8760 hours. CBC: Recent Labs    02/15/20 1027 05/11/20 1058 08/10/20 1045  WBC 9.4 7.4 6.9  NEUTROABS 5,894 4,536 4,168  HGB 11.5* 11.3* 10.6*  HCT 35.2 34.8* 32.5*  MCV 79.3* 78.7* 78.3*  PLT 298 371 320   Lipid Panel: Recent Labs    02/15/20 1027 05/11/20 1058 08/10/20 1045  CHOL 173 164 140  HDL 62 51 60  LDLCALC 92 94 65  TRIG 98 95 66  CHOLHDL 2.8 3.2 2.3   TSH: No results for input(s): TSH in the last 8760 hours. A1C: Lab Results  Component Value Date   HGBA1C 7.3 (H) 08/10/2020     Assessment/Plan 1. Iron deficiency anemia, unspecified iron deficiency anemia type -continues on supplement. No signs of blood loss.  - CBC with Differential/Platelet  2. Type  2 diabetes mellitus without complication, without long-term current use of insulin ([place]) -has made dietary modifications. Continues on januvia and metformin twice daily.  -Encouraged dietary compliance, routine foot care/monitoring and to keep up with diabetic eye exams through ophthalmology  - Hemoglobin A1c  3. Hyperlipidemia associated with type 2 diabetes mellitus ([place]) -continues on crestor 10 mg daily   4. Essential hypertension, benign -controlled on quinapril and amlodipine.  - CMP with eGFR([facility])  5. Moderate persistent asthma in adult without complication Stable, without recent flares, continues on breo  6. Primary osteoarthritis of left knee -ongoing pain, continues to follow up with  orthopedics.   Next appt: 4 months.  Carlos American. [place], [facility] [phone]

## 2020-11-13 LAB — COMPLETE METABOLIC PANEL WITH GFR
AG Ratio: 1.4 (calc) (ref 1.0–2.5)
ALT: 10 U/L (ref 6–29)
AST: 11 U/L (ref 10–35)
Albumin: 4.6 g/dL (ref 3.6–5.1)
Alkaline phosphatase (APISO): 104 U/L (ref 37–153)
BUN: 12 mg/dL (ref 7–25)
CO2: 26 mmol/L (ref 20–32)
Calcium: 10.3 mg/dL (ref 8.6–10.4)
Chloride: 103 mmol/L (ref 98–110)
Creat: 0.8 mg/dL (ref 0.50–0.99)
GFR, Est African American: 90 mL/min/{1.73_m2} (ref >=60)
GFR, Est Non African American: 77 mL/min/{1.73_m2} (ref >=60)
Globulin: 3.4 g/dL (calc) (ref 1.9–3.7)
Glucose, Bld: 122 mg/dL — ABNORMAL HIGH (ref 65–99)
Potassium: 4.4 mmol/L (ref 3.5–5.3)
Sodium: 136 mmol/L (ref 135–146)
Total Bilirubin: 0.4 mg/dL (ref 0.2–1.2)
Total Protein: 8 g/dL (ref 6.1–8.1)

## 2020-11-13 LAB — CBC WITH DIFFERENTIAL/PLATELET
Absolute Monocytes: 339 cells/uL (ref 200–950)
Basophils Absolute: 92 cells/uL (ref 0–200)
Basophils Relative: 1.2 %
Eosinophils Absolute: 131 cells/uL (ref 15–500)
Eosinophils Relative: 1.7 %
HCT: 33.6 % — ABNORMAL LOW (ref 35.0–45.0)
Hemoglobin: 11 g/dL — ABNORMAL LOW (ref 11.7–15.5)
Lymphs Abs: 2264 cells/uL (ref 850–3900)
MCH: 25.8 pg — ABNORMAL LOW (ref 27.0–33.0)
MCHC: 32.7 g/dL (ref 32.0–36.0)
MCV: 78.7 fL — ABNORMAL LOW (ref 80.0–100.0)
MPV: 11.4 fL (ref 7.5–12.5)
Monocytes Relative: 4.4 %
Neutro Abs: 4874 cells/uL (ref 1500–7800)
Neutrophils Relative %: 63.3 %
Platelets: 330 10*3/uL (ref 140–400)
RBC: 4.27 10*6/uL (ref 3.80–5.10)
RDW: 14.6 % (ref 11.0–15.0)
Total Lymphocyte: 29.4 %
WBC: 7.7 10*3/uL (ref 3.8–10.8)

## 2020-11-13 LAB — HEMOGLOBIN A1C
Hgb A1c MFr Bld: 6.7 % of total Hgb — ABNORMAL HIGH (ref <5.7)
Mean Plasma Glucose: 146 mg/dL
eAG (mmol/L): 8.1 mmol/L

## 2020-11-19 ENCOUNTER — Telehealth: Payer: Self-pay | Admitting: Cardiology

## 2020-11-19 NOTE — Telephone Encounter (Signed)
  Patient would like to switch from Dr Harrell Gave to Dr Gasper Sells due to needing a closer location

## 2020-11-27 ENCOUNTER — Ambulatory Visit: Payer: Medicare Other | Admitting: Family Medicine

## 2020-11-30 ENCOUNTER — Other Ambulatory Visit: Payer: Self-pay | Admitting: Nurse Practitioner

## 2020-11-30 NOTE — Telephone Encounter (Signed)
Ok by me, thanks

## 2020-12-11 ENCOUNTER — Ambulatory Visit: Payer: Medicare Other | Admitting: Allergy & Immunology

## 2020-12-28 ENCOUNTER — Other Ambulatory Visit: Payer: Self-pay | Admitting: Allergy & Immunology

## 2021-01-09 ENCOUNTER — Other Ambulatory Visit: Payer: Self-pay | Admitting: Allergy & Immunology

## 2021-01-09 ENCOUNTER — Other Ambulatory Visit: Payer: Self-pay | Admitting: Nurse Practitioner

## 2021-01-10 ENCOUNTER — Ambulatory Visit (INDEPENDENT_AMBULATORY_CARE_PROVIDER_SITE_OTHER): Payer: Medicare Other | Admitting: Allergy & Immunology

## 2021-01-10 ENCOUNTER — Other Ambulatory Visit: Payer: Self-pay

## 2021-01-10 VITALS — BP 120/68 | HR 78 | Temp 98.3°F | Resp 18 | Ht 62.0 in | Wt 200.0 lb

## 2021-01-10 DIAGNOSIS — K219 Gastro-esophageal reflux disease without esophagitis: Secondary | ICD-10-CM | POA: Diagnosis not present

## 2021-01-10 DIAGNOSIS — J4540 Moderate persistent asthma, uncomplicated: Secondary | ICD-10-CM

## 2021-01-10 DIAGNOSIS — J302 Other seasonal allergic rhinitis: Secondary | ICD-10-CM

## 2021-01-10 DIAGNOSIS — J3089 Other allergic rhinitis: Secondary | ICD-10-CM | POA: Diagnosis not present

## 2021-01-10 NOTE — Patient Instructions (Addendum)
1. Moderate persistent asthma, uncomplicated - Spirometry looked good today. - We are not going to make any changes at this time.  - Continue to take the Breo one puff once daily.  - Daily controller medication(s): Breo 200/52mg one puff once daily - Prior to physical activity: ProAir 2 puffs 10-15 minutes before physical activity. - Rescue medications: ProAir 4 puffs every 4-6 hours as needed or albuterol nebulizer one vial every 4-6 hours as needed - Asthma control goals:  * Full participation in all desired activities (may need albuterol before activity) * Albuterol use two time or less a week on average (not counting use with activity) * Cough interfering with sleep two time or less a month * Oral steroids no more than once a year * No hospitalizations  2. Chronic rhinitis - Continue with nasal ipratropium one spray per nostril every six hours AS NEEDED. - Continue with fluticasone nasal spray two sprays per nostril daily (AIM FOR THE EARS). - Continue with Singulair '10mg'$  daily.   3. GERD  - Controlled without reflux medications.   4. Return in about 6 months (around 07/13/2021).    Please inform [place] any Emergency Department visits, hospitalizations, or changes in symptoms. Call [place]efore going to the ED for breathing or allergy symptoms since we might be able to fit you in for a sick visit. Feel free to contact u[place]anytime with any questions, problems, or concerns.  It was a pleasure to see you again today!  Websites that have reliable patient information: 1. American Academy of Asthma, Allergy, and Immunology: www.aaaai.org 2. Food Allergy Research and Education (FARE): foodallergy.org 3. Mothers of Asthmatics: http://www.asthmacommunitynetwork.org 4. American College of Allergy, Asthma, and Immunology: www.acaai.org   COVID-19 Vaccine Information can be found at: [email or web address] questions related to vaccine  distribution or appointments, please email [email or web address] or call [phone]     "Like" [facility] Facebook and Instagram for our latest updates!       Make sure you are registered to vote! If you have moved or changed any of your contact information, you will need to get this updated before voting!  In some cases, you MAY be able to register to vote online: [email or web address]

## 2021-01-10 NOTE — Progress Notes (Signed)
FOLLOW UP  Date of Service/Encounter:  01/10/21   Assessment:   Moderate persistent asthma, uncomplicated - very stable   Seasonal and perennial allergic rhinitis (trees, mold, dust mite)   Gastroesophageal reflux disease  Plan/Recommendations:   1. Moderate persistent asthma, uncomplicated - Spirometry looked good today. - We are not going to make any changes at this time.  - Continue to take the Breo one puff once daily.  - Daily controller medication(s): Breo 200/75mg one puff once daily - Prior to physical activity: ProAir 2 puffs 10-15 minutes before physical activity. - Rescue medications: ProAir 4 puffs every 4-6 hours as needed or albuterol nebulizer one vial every 4-6 hours as needed - Asthma control goals:  * Full participation in all desired activities (may need albuterol before activity) * Albuterol use two time or less a week on average (not counting use with activity) * Cough interfering with sleep two time or less a month * Oral steroids no more than once a year * No hospitalizations  2. Chronic rhinitis - Continue with nasal ipratropium one spray per nostril every six hours AS NEEDED. - Continue with fluticasone nasal spray two sprays per nostril daily (AIM FOR THE EARS). - Continue with Singulair '10mg'$  daily.   3. GERD  - Controlled without reflux medications.   4. Return in about 6 months (around 07/13/2021).     Subjective:   Tabitha MSECILY LALANDEis a 65y.o. female presenting today for follow up of No chief complaint on file.   Tabitha MKEAJAH MIESNERhas a history of the following: Patient Active Problem List   Diagnosis Date Noted   Pain in left knee 09/13/2019   Aftercare following left knee joint replacement surgery 07/19/2019   Abnormal EKG 03/02/2019   Incomplete left bundle branch block (LBBB) 03/02/2019   Rebound headache 12/13/2018   Overuse of medication 12/13/2018   Intractable chronic migraine without aura and without status  migrainosus 12/13/2018   Microcytic anemia 02/24/2018   High risk medication use 08/06/2017   Seasonal and perennial allergic rhinitis 07/30/2017   Recurrent infections 07/30/2017   Cough 06/08/2017   Moderate persistent asthma without complication [id number]  Chronic constipation 02/04/2017   Hyperlipidemia due to type 2 diabetes mellitus ([place] 02/04/2017   Hallux rigidus, left foot 01/01/2017   Postmenopausal symptoms 02/22/2016   Arthrofibrosis of knee joint 08/28/2015   Left hip pain 10/08/2014   Type 2 diabetes mellitus without complication, without long-term current use of insulin ([place] 10/08/2014   Hyperlipidemia LDL goal <100 10/08/2014   Essential hypertension, benign 08/30/2014   Diabetes mellitus, type 2 ([place] 08/30/2014   Diverticulosis of colon without hemorrhage 08/30/2014   Chronic low back pain 08/30/2014   Chronic pain syndrome 08/30/2014   Asthma in adult without complication [id number]  Contracture of left knee, arthrofibrosis post op TKA 04/09/2012   Osteoarthritis of left knee 02/23/2012    History obtained from: chart review and patient.  Tabitha Oneal a 65y.o. female presenting for a follow up visit. She was last seen in January 2022.  At that time, we did not do spirometry.  Continue with Breo 200 mcg 1 puff once daily as well as ProAir as needed.  For her rhinitis, we continued with nasal Atrovent as needed as well as Flonase 2 sprays per nostril daily.  She was having nosebleeds, so we reviewed technique on how to spray the Flonase.  We continue with Singulair 10 mg daily.  We did diagnose her  with sinusitis and started her on Augmentin and prednisone.  We also gave her Mucinex.  Her reflux was controlled with reflux medications.  Since last visit, she has done well.  However, her course has been complicated by left leg issues.  She had a knee replacement and was getting along fairly well but then became overexuberant and ended up falling down.  She did not  have to have repeat surgery, but she has been in a brace since then.  The surgery was in early July.  Asthma/Respiratory Symptom History: She remains on the Breo 1 puff once daily.  She has not been using her rescue inhaler much at all.  She has not needed prednisone for her breathing.  ACT score is 25, indicating excellent asthma control.  She endorses sleeping well at night and has not been to the emergency room.  She does have a nebulizer treatment to use as needed, but she has not picked that up in about 3 months.  Allergic Rhinitis Symptom History: She remains on the nasal Atrovent 1 spray per nostril as needed.  She also is on the Flonase 2 sprays per nostril, aiming for the ears.  She is also on Singulair 10 mg daily.  She has not required any antibiotics since we treated her for sinusitis back in January.  Overall, symptoms are well controlled.  GERD Symptom History: She remains on omeprazole 40 mg once daily.  Otherwise, there have been no changes to her past medical history, surgical history, family history, or social history.    Review of Systems  Constitutional: Negative.  Negative for chills, fever, malaise/fatigue and weight loss.  HENT:  Positive for congestion. Negative for ear discharge, ear pain and sinus pain.   Eyes:  Negative for pain, discharge and redness.  Respiratory:  Negative for cough, sputum production, shortness of breath and wheezing.   Cardiovascular: Negative.  Negative for chest pain and palpitations.  Gastrointestinal:  Negative for abdominal pain, constipation, diarrhea, heartburn, nausea and vomiting.  Skin: Negative.  Negative for itching and rash.  Neurological:  Negative for dizziness and headaches.  Endo/Heme/Allergies:  Positive for environmental allergies. Does not bruise/bleed easily.      Objective:   Blood pressure 120/68, pulse 78, temperature 98.3 F (36.8 C), temperature source Temporal, resp. rate 18, height '5\' 2"'$  (1.575 m), weight 200 lb  (90.7 kg), SpO2 98 %. Body mass index is 36.58 kg/m.   Physical Exam:  Physical Exam Vitals reviewed.  Constitutional:      Appearance: She is well-developed.     Comments: Feisty.  HENT:     Head: Normocephalic and atraumatic.     Right Ear: Tympanic membrane, ear canal and external ear normal.     Left Ear: Tympanic membrane, ear canal and external ear normal.     Nose: No nasal deformity, septal deviation, mucosal edema or rhinorrhea.     Right Turbinates: Enlarged and swollen.     Left Turbinates: Enlarged and swollen.     Right Sinus: No maxillary sinus tenderness or frontal sinus tenderness.     Left Sinus: No maxillary sinus tenderness or frontal sinus tenderness.     Mouth/Throat:     Mouth: Mucous membranes are not pale and not dry.     Pharynx: Uvula midline.  Eyes:     General: Lids are normal. No allergic shiner.       Right eye: No discharge.        Left eye: No discharge.  Conjunctiva/sclera: Conjunctivae normal.     Right eye: Right conjunctiva is not injected. No chemosis.    Left eye: Left conjunctiva is not injected. No chemosis.    Pupils: Pupils are equal, round, and reactive to light.  Cardiovascular:     Rate and Rhythm: Normal rate and regular rhythm.     Heart sounds: Normal heart sounds.  Pulmonary:     Effort: Pulmonary effort is normal. No tachypnea, accessory muscle usage or respiratory distress.     Breath sounds: Normal breath sounds. No wheezing, rhonchi or rales.     Comments: Moving air well in all lung fields.  No increased work of breathing. Chest:     Chest wall: No tenderness.  Lymphadenopathy:     Cervical: No cervical adenopathy.  Skin:    General: Skin is warm.     Capillary Refill: Capillary refill takes less than 2 seconds.     Coloration: Skin is not pale.     Findings: No abrasion, erythema, petechiae or rash. Rash is not papular, urticarial or vesicular.     Comments: No eczematous or urticarial lesions noted.   Neurological:     Mental Status: She is alert.  Psychiatric:        Behavior: Behavior is cooperative.     Diagnostic studies: none (spirometry malfunctioned)      Salvatore Marvel, MD  [facility]

## 2021-01-11 ENCOUNTER — Encounter: Payer: Self-pay | Admitting: Allergy & Immunology

## 2021-01-11 MED ORDER — IPRATROPIUM BROMIDE 0.03 % NA SOLN: 1.0000 | Freq: Four times a day (QID) | NASAL | 5 refills | Status: DC | PRN

## 2021-01-11 MED ORDER — FLUTICASONE FUROATE-VILANTEROL 200-25 MCG/INH IN AEPB: INHALATION_SPRAY | RESPIRATORY_TRACT | 5 refills | Status: DC

## 2021-01-11 MED ORDER — FLUTICASONE PROPIONATE 50 MCG/ACT NA SUSP: NASAL | 2 refills | Status: DC

## 2021-01-11 MED ORDER — PROAIR RESPICLICK 108 (90 BASE) MCG/ACT IN AEPB: INHALATION_SPRAY | RESPIRATORY_TRACT | 2 refills | Status: DC

## 2021-01-14 ENCOUNTER — Other Ambulatory Visit: Payer: Self-pay | Admitting: *Deleted

## 2021-01-14 MED ORDER — AMLODIPINE BESYLATE 10 MG PO TABS: 10.0000 mg | ORAL_TABLET | Freq: Every day | ORAL | 1 refills | Status: AC

## 2021-01-14 NOTE — Telephone Encounter (Signed)
Patient requested refill

## 2021-02-25 ENCOUNTER — Encounter: Payer: Medicare Other | Admitting: Nurse Practitioner

## 2021-02-28 NOTE — Progress Notes (Signed)
PATIENT: Tabitha Oneal DOB: Jul 16, 1955  REASON FOR VISIT: follow up HISTORY FROM: patient  Chief Complaint  Patient presents with   Follow-up    Pt alone, rm 11. Presents today for follow up. She is in need medication refills. CPAP working well. DME [facility]      [place]: migraines [place]: sleep  HISTORY OF PRESENT ILLNESS: 03/06/21 ALL:  Tabitha Oneal returns for follow up for migraines and OSA on CPAP. She continues topiramate 100mg  at bedtime. She is doing well. Rare headaches, usually if she skips meals. Tizanidine 4mg  as needed helps with abortive therapy. She is followed regularly by PCP.     08/24/2019 ALL:  Tabitha Oneal is a 65 y.o. female here today for follow up for migraines. She was started on topiramate 100mg  at bedtime in 11/2018. Tizanidine was given for PRN use. She feels that headaches have improved. She was having headaches daily but now feels they occur 2-3 times a week. Tizanidine helps. Headaches can be present in the morning or occur throughout the day. She does have a headache today but blames this on not eating breakfast. She does snore. She wakes herself up at night snoring. She lives alone but reports that when her husband was alive he would fuss at her for snoring. She wakes multiple times at night. She thought that it was normal and has not paid much attention to how she feels in the mornings. She does not usually feel excessively tired throughout the day. Her daughter has sleep apnea treated with CPAP [facility]rpay.    HISTORY: (copied from Tabitha Oneal note on 12/13/2018)  HPI:  Tabitha Oneal is a 65 y.o. female here as requested by Tabitha Chandler, NP for headaches.  She has a past medical history of migraines, hypertension, asthma, diabetes, hyperlipidemia, osteoarthritis, contracture of left knee, chronic low back pain, chronic pain syndrome, high risk medication use. She takes excedrin 2x a day. She also takes the "PM kond"  The  headaches are more in the front behind both eyes. It is throbbing. It hurts. She has daily headaches. She only took the Topiramate for a few days and then stopped. She has nausea. She has to lay down, throbbing. Sleeping helps. Light sensitivity. The headaches get better with medicaine but it comes back when the medicatuon wears off. She denies vision changes, numbness, weakness, speech or gait new difficulties. It hurts to bend over, the headaches will wake her up. She does not know if she snores and can;t tell me a lot about her sleeping.  No other focal neurologic deficits, associated symptoms, inciting events or modifiable factors.   Migraine medications tried include: Topamax, baclofen, Excedrin, Tylenol, Zofran, Accupril (ACE inhibitors can be used as migraine prevention), Phenergan   Reviewed notes, labs and imaging from outside physicians, which showed:   I reviewed notes from Tabitha Oneal.  Patient has a history of migraines.  Patient reports she often wakes up in the morning with a headache.  She was tried on several migraine medications and it has not helped.  Baclofen has not helped either.  She takes Excedrin Migraine often.  She denies neck tension.  Reports the head pain is in the front near her eyes.  She has blurry/cloudy vision even with her glasses.  Her last eye exam was at least a year ago or maybe more.  She has uncontrolled blood pressure in the office it was 168/97.  She does eat mostly canned foods at home.  She is on Topamax 25 mg twice daily.   REVIEW OF SYSTEMS: Out of a complete 14 system review of symptoms, the patient complains only of the following symptoms, headaches, snoring, knee pain and all other reviewed systems are negative.  ALLERGIES: No Known Allergies  HOME MEDICATIONS: Outpatient Medications Prior to Visit  Medication Sig Dispense Refill   Albuterol Sulfate (PROAIR RESPICLICK) 462 (90 Base) MCG/ACT AEPB INHALE 4 PUFFS INTO THE LUNGS EVERY 4 HOURS AS  NEEDED 1 each 2   amLODipine (NORVASC) 10 MG tablet Take 1 tablet (10 mg total) by mouth daily. 90 tablet 1   aspirin EC 81 MG tablet Take 1 tablet (81 mg total) by mouth daily. Swallow whole. 90 tablet 3   azelastine (ASTELIN) 0.1 % nasal spray USE 1 SPRAY EACH NOSTRIL TWICE DAILY 30 mL 1   Calcium Carb-Cholecalciferol (CALCIUM 600 + D PO) Take by mouth.     diclofenac sodium (VOLTAREN) 1 % GEL Apply 8 g topically 2 (two) times daily as needed. On knees     ferrous sulfate 325 (65 FE) MG tablet TAKE 1 TABLET BY MOUTH EVERY DAY WITH BREAKFAST 90 tablet 1   fluticasone (FLONASE) 50 MCG/ACT nasal spray SPRAY 2 SPRAYS INTO EACH NOSTRIL EVERY DAY 48 mL 2   fluticasone furoate-vilanterol (BREO ELLIPTA) 200-25 MCG/INH AEPB INHALE 1 PUFF BY MOUTH EVERY DAY 60 each 5   gabapentin (NEURONTIN) 100 MG capsule TAKE 1 CAPSULE (100 MG) BY ORAL ROUTE 3 TIMES PER DAY FOR 90 DAYS 270 capsule 3   ipratropium (ATROVENT) 0.03 % nasal spray Place 1 spray into both nostrils every 6 (six) hours as needed. 30 mL 5   JANUVIA 100 MG tablet TAKE 1 TABLET BY MOUTH EVERY DAY 90 tablet 1   metFORMIN (GLUCOPHAGE) 500 MG tablet Take 500 mg by mouth 2 (two) times daily.     montelukast (SINGULAIR) 10 MG tablet One at bedtime 90 tablet 1   omeprazole (PRILOSEC) 40 MG capsule TAKE 1 CAPSULE BY MOUTH EVERY DAY 90 capsule 1   ONE TOUCH ULTRA TEST test strip Check blood sugar once daily as directed  3   quinapril (ACCUPRIL) 40 MG tablet Take 1 tablet (40 mg total) by mouth at bedtime. 90 tablet 1   rosuvastatin (CRESTOR) 10 MG tablet TAKE 1 TABLET BY MOUTH EVERY DAY 90 tablet 1   rosuvastatin (CRESTOR) 20 MG tablet Take 1 tablet (20 mg total) by mouth daily. 90 tablet 3   metFORMIN (GLUCOPHAGE) 1000 MG tablet TAKE ONE TABLET BY MOUTH IN THE MORNING AND TAKE ONE TABLET BY MOUTH IN THE EVENING. 180 tablet 1   topiramate (TOPAMAX) 100 MG tablet TAKE 1 TABLET BY MOUTH EVERYDAY AT BEDTIME 90 tablet 0   No facility-administered  medications prior to visit.    PAST MEDICAL HISTORY: Past Medical History:  Diagnosis Date   Asthma    QVAR daily and Albuterol as needed   Bladder infection    taking Keflex daily    Chronic back pain    reason unknown   Diabetes mellitus    takes Metformin and Tradjenta daily   GERD (gastroesophageal reflux disease)    takes Omeprazole daily   Hard of hearing    Headache    daily.Takes Excedrine daily   Hepatitis C    History of blood transfusion    no abnormal reaction noted   Hyperlipidemia    takes Atorvastatin daily   Hypertension    takes Quinapril daily   Joint  pain    Joint swelling    Nocturia    Obesity    Osteoarthritis of left knee 02/23/2012   Seasonal allergies    takes Claritin and Singulair daily.Nasal spray as needed   Shortness of breath dyspnea    with exertion    PAST SURGICAL HISTORY: Past Surgical History:  Procedure Laterality Date   ABDOMINAL HYSTERECTOMY     EYE SURGERY  2021   INJECTION KNEE  04/09/2012   Procedure: KNEE INJECTION;  Surgeon: Johnny Bridge, MD;  Location: [place];  Service: Orthopedics;  Laterality: Left;   JOINT REPLACEMENT     acl   bil knees   KNEE ARTHROTOMY Left 08/28/2015   Procedure: LEFT KNEE ARTHROFIBROSIS EXCISION; pylectomy;  Surgeon: Meredith Pel, MD;  Location: [place];  Service: Orthopedics;  Laterality: Left;   KNEE CLOSED REDUCTION  04/09/2012   Procedure: CLOSED MANIPULATION KNEE;  Surgeon: Johnny Bridge, MD;  Location: [place];  Service: Orthopedics;  Laterality: Left;  Manipulation Knee with Anesthesia includes Application of Traction    NERVE SURGERY Left 11/06/2020   Per patient on left knee   TOTAL KNEE ARTHROPLASTY  02/23/2012   Procedure: TOTAL KNEE ARTHROPLASTY;  Surgeon: Johnny Bridge, MD;  Location: [place];  Service: Orthopedics;  Laterality: Left;    FAMILY HISTORY: Family History  Problem Relation Age of Onset   Bronchitis Father 41    Alzheimer's disease Mother    Ovarian cancer Mother    Stomach cancer Mother    High blood pressure Sister    High blood pressure Sister    Headache Sister        pt thinks    High blood pressure Sister    High blood pressure Sister        pt thinks   Breast cancer Sister    High blood pressure Sister    High blood pressure Sister    Headache Daughter    Diabetes Daughter    Diabetes Daughter    Diabetes Other    Colon cancer Neg Hx    Esophageal cancer Neg Hx    Rectal cancer Neg Hx     SOCIAL HISTORY: Social History   Socioeconomic History   Marital status: Widowed    Spouse name: Not on file   Number of children: 4   Years of education: 14   Highest education level: Some college, no degree  Occupational History   Not on file  Tobacco Use   Smoking status: Never   Smokeless tobacco: Never  Vaping Use   Vaping Use: Never used  Substance and Sexual Activity   Alcohol use: No    Alcohol/week: 0.0 standard drinks   Drug use: No   Sexual activity: Yes    Birth control/protection: Surgical  Other Topics Concern   Not on file  Social History Narrative   Diet: none   Do you drink/eat things with caffeine ? Coffee    Material status: widow     What year were you married? 08/01/1978   Do you live in a house, apartment, assisted living,condo, trailer,ect.)? Townhouse (temporary)    Is it one or more stories? Yes   How many persons live in your home? Two   Do you have any pets in your home ? Yes, Shih-tzu   Current or past profession: none   Do you exercise? No  Type & how often: no   Do you have a living  will ? No   Do you have a DNR form? No   If not, do you want to discuss one?  Not now   Do you have signed POA /HPOA forms? No    If so, please bring to your appointment.            Update 12/13/2018: pt states she doesn't drink much coffee, tea   She lives alone                     Social Determinants of Health   Financial Resource Strain: Not on file   Food Insecurity: Not on file  Transportation Needs: Not on file  Physical Activity: Not on file  Stress: Not on file  Social Connections: Not on file  Intimate Partner Violence: Not on file      PHYSICAL EXAM  Vitals:   03/06/21 1117  BP: (!) 142/74  Pulse: 71  Weight: 206 lb (93.4 kg)  Height: 5\' 1"  (1.549 m)    Body mass index is 38.92 kg/m.  Generalized: Well developed, in no acute distress  Cardiology: normal rate and rhythm, no murmur noted Neurological examination  Mentation: Alert oriented to time, place, history taking. Follows all commands speech and language fluent Cranial nerve II-XII: Pupils were equal round reactive to light. Extraocular movements were full, visual field were full on confrontational test. Facial sensation and strength were normal. Uvula tongue midline. Head turning and shoulder shrug  were normal and symmetric. Motor: The motor testing reveals 5 over 5 strength of all 4 extremities. Good symmetric motor tone is noted throughout.  Gait and station: has left limp due to left knee brace, uses single prong cane for stability   DIAGNOSTIC DATA (LABS, IMAGING, TESTING) - I reviewed patient records, labs, notes, testing and imaging myself where available.  MMSE - Mini Mental State Exam 05/18/2019  Orientation to time 5  Orientation to Place 5  Registration 3  Attention/ Calculation 5  Recall 3  Language- name 2 objects 2  Language- repeat 1  Language- follow 3 step command 3  Language- read & follow direction 1  Write a sentence 1  Copy design 1  Total score 30     Lab Results  Component Value Date   WBC 7.7 11/12/2020   HGB 11.0 (L) 11/12/2020   HCT 33.6 (L) 11/12/2020   MCV 78.7 (L) 11/12/2020   PLT 330 11/12/2020      Component Value Date/Time   NA 136 11/12/2020 1102   NA 145 (H) 12/13/2018 1431   K 4.4 11/12/2020 1102   CL 103 11/12/2020 1102   CO2 26 11/12/2020 1102   GLUCOSE 122 (H) 11/12/2020 1102   BUN 12 11/12/2020  1102   BUN 10 12/13/2018 1431   CREATININE 0.80 11/12/2020 1102   CALCIUM 10.3 11/12/2020 1102   PROT 8.0 11/12/2020 1102   PROT 7.5 11/21/2015 1026   ALBUMIN 3.8 02/08/2019 1752   ALBUMIN 4.4 11/21/2015 1026   AST 11 11/12/2020 1102   ALT 10 11/12/2020 1102   ALKPHOS 176 (H) 02/08/2019 1752   BILITOT 0.4 11/12/2020 1102   BILITOT 0.3 11/21/2015 1026   GFRNONAA 77 11/12/2020 1102   GFRAA 90 11/12/2020 1102   Lab Results  Component Value Date   CHOL 140 08/10/2020   HDL 60 08/10/2020   LDLCALC 65 08/10/2020   TRIG 66 08/10/2020   CHOLHDL 2.3 08/10/2020   Lab Results  Component Value Date   HGBA1C 6.7 (  H) 11/12/2020   No results found for: VITAMINB12 Lab Results  Component Value Date   TSH 0.517 12/13/2018       ASSESSMENT AND PLAN 65 y.o. year old female  has a past medical history of Asthma, Bladder infection, Chronic back pain, Diabetes mellitus, GERD (gastroesophageal reflux disease), Hard of hearing, Headache, Hepatitis C, History of blood transfusion, Hyperlipidemia, Hypertension, Joint pain, Joint swelling, Nocturia, Obesity, Osteoarthritis of left knee (02/23/2012), Seasonal allergies, and Shortness of breath dyspnea. here with     ICD-10-CM   1. OSA on CPAP  G47.33 For home use only DME continuous positive airway pressure (CPAP)   Z99.89     2. Chronic migraine without aura without status migrainosus, not intractable  G43.709        Mrs Therriault is doing well, today. Headaches are significantly better. She will continue topiramate 100mg  daily and tizanidine 4mg  as needed. She will avoid missed meals and other triggers as identified.  Compliance report shows excellent compliance. She will continue CPAP nightly for at least 4 hours. Healthy lifestyle habits encouraged. She will follow up in 1 year. She verbalizes understanding and agreement with this plan.    Orders Placed This Encounter  Procedures   For home use only DME continuous positive airway pressure  (CPAP)    Supplies    Order Specific Question:   Length of Need    Answer:   Lifetime    Order Specific Question:   Patient has OSA or probable OSA    Answer:   Yes    Order Specific Question:   Is the patient currently using CPAP in the home    Answer:   Yes    Order Specific Question:   Settings    Answer:   Other see comments    Order Specific Question:   CPAP supplies needed    Answer:   Mask, headgear, cushions, filters, heated tubing and water chamber      Meds ordered this encounter  Medications   topiramate (TOPAMAX) 100 MG tablet    Sig: TAKE 1 TABLET BY MOUTH EVERYDAY AT BEDTIME    Dispense:  90 tablet    Refill:  3    Order Specific Question:   Supervising Provider    Answer:   [name] [[id number]]   tiZANidine (ZANAFLEX) 4 MG tablet    Sig: Take 1 tablet (4 mg total) by mouth every 6 (six) hours as needed (as needed for migraines).    Dispense:  30 tablet    Refill:  0    Order Specific Question:   Supervising Provider    Answer:   Bess Harvest, FNP-C 03/06/2021, 11:53 AM [facility] [address] [place], [place] [address] [phone]

## 2021-02-28 NOTE — Patient Instructions (Addendum)
Please continue using your CPAP regularly. While your insurance requires that you use CPAP at least 4 hours each night on 70% of the nights, I recommend, that you not skip any nights and use it throughout the night if you can. Getting used to CPAP and staying with the treatment long term does take time and patience and discipline. Untreated obstructive sleep apnea when it is moderate to severe can have an adverse impact on cardiovascular health and raise her risk for heart disease, arrhythmias, hypertension, congestive heart failure, stroke and diabetes. Untreated obstructive sleep apnea causes sleep disruption, nonrestorative sleep, and sleep deprivation. This can have an impact on your day to day functioning and cause daytime sleepiness and impairment of cognitive function, memory loss, mood disturbance, and problems focussing. Using CPAP regularly can improve these symptoms.   Continue topiramate 100mg  at bedtime. We will continue tizanidine only as needed for migraines.   Follow up in 1 year

## 2021-03-06 ENCOUNTER — Ambulatory Visit: Payer: Medicare Other | Admitting: Family Medicine

## 2021-03-06 ENCOUNTER — Encounter: Payer: Self-pay | Admitting: Family Medicine

## 2021-03-06 VITALS — BP 142/74 | HR 71 | Ht 61.0 in | Wt 206.0 lb

## 2021-03-06 DIAGNOSIS — G4733 Obstructive sleep apnea (adult) (pediatric): Secondary | ICD-10-CM | POA: Diagnosis not present

## 2021-03-06 DIAGNOSIS — Z9989 Dependence on other enabling machines and devices: Secondary | ICD-10-CM | POA: Diagnosis not present

## 2021-03-06 DIAGNOSIS — G43709 Chronic migraine without aura, not intractable, without status migrainosus: Secondary | ICD-10-CM

## 2021-03-06 MED ORDER — TOPIRAMATE 100 MG PO TABS
ORAL_TABLET | ORAL | 3 refills | Status: DC
Start: 2021-03-06 — End: 2022-03-04

## 2021-03-06 MED ORDER — TIZANIDINE HCL 4 MG PO TABS
4.0000 mg | ORAL_TABLET | Freq: Four times a day (QID) | ORAL | 0 refills | Status: DC | PRN
Start: 2021-03-06 — End: 2022-02-25

## 2021-03-15 ENCOUNTER — Ambulatory Visit: Payer: Medicare Other | Admitting: Nurse Practitioner

## 2021-04-23 ENCOUNTER — Other Ambulatory Visit: Payer: Self-pay | Admitting: Allergy & Immunology

## 2021-04-30 ENCOUNTER — Other Ambulatory Visit: Payer: Self-pay | Admitting: Student

## 2021-04-30 DIAGNOSIS — N390 Urinary tract infection, site not specified: Secondary | ICD-10-CM

## 2021-05-07 ENCOUNTER — Other Ambulatory Visit: Payer: Self-pay

## 2021-05-07 ENCOUNTER — Ambulatory Visit: Payer: Medicare Other | Admitting: Allergy & Immunology

## 2021-05-07 ENCOUNTER — Encounter: Payer: Self-pay | Admitting: Allergy & Immunology

## 2021-05-07 VITALS — BP 130/58 | HR 98 | Temp 98.2°F | Resp 16 | Ht 62.0 in | Wt 201.4 lb

## 2021-05-07 DIAGNOSIS — K219 Gastro-esophageal reflux disease without esophagitis: Secondary | ICD-10-CM | POA: Diagnosis not present

## 2021-05-07 DIAGNOSIS — J0100 Acute maxillary sinusitis, unspecified: Secondary | ICD-10-CM | POA: Diagnosis not present

## 2021-05-07 DIAGNOSIS — J3089 Other allergic rhinitis: Secondary | ICD-10-CM | POA: Diagnosis not present

## 2021-05-07 DIAGNOSIS — J4541 Moderate persistent asthma with (acute) exacerbation: Secondary | ICD-10-CM | POA: Diagnosis not present

## 2021-05-07 DIAGNOSIS — J302 Other seasonal allergic rhinitis: Secondary | ICD-10-CM

## 2021-05-07 MED ORDER — METHYLPREDNISOLONE ACETATE 40 MG/ML IJ SUSP
40.0000 mg | Freq: Once | INTRAMUSCULAR | Status: AC
  Administered 2021-05-07: 40 mg via INTRAMUSCULAR

## 2021-05-07 MED ORDER — AMOXICILLIN-POT CLAVULANATE 875-125 MG PO TABS: 1.0000 | ORAL_TABLET | Freq: Two times a day (BID) | ORAL | 0 refills | Status: AC

## 2021-05-07 MED ORDER — IPRATROPIUM BROMIDE 0.03 % NA SOLN: 1.0000 | Freq: Four times a day (QID) | NASAL | 5 refills | Status: DC | PRN

## 2021-05-07 MED ORDER — FLUTICASONE FUROATE-VILANTEROL 200-25 MCG/ACT IN AEPB: 1.0000 | INHALATION_SPRAY | Freq: Every day | RESPIRATORY_TRACT | Status: DC

## 2021-05-07 MED ORDER — MONTELUKAST SODIUM 10 MG PO TABS: ORAL_TABLET | ORAL | 1 refills | Status: DC

## 2021-05-07 MED ORDER — FLUTICASONE PROPIONATE 50 MCG/ACT NA SUSP: NASAL | 2 refills | Status: DC

## 2021-05-07 NOTE — Patient Instructions (Addendum)
1. Moderate persistent asthma with acute exacerbation - Spirometry not done since you were sick today. - DepoMedrol injection given today. - Start the prednisone pack provided today. - Continue with Breo one puff once daily.  - Daily controller medication(s): Breo 200/48mcg one puff once daily - Prior to physical activity: ProAir 2 puffs 10-15 minutes before physical activity. - Rescue medications: ProAir 4 puffs every 4-6 hours as needed or albuterol nebulizer one vial every 4-6 hours as needed - Asthma control goals:  * Full participation in all desired activities (may need albuterol before activity) * Albuterol use two time or less a week on average (not counting use with activity) * Cough interfering with sleep two time or less a month * Oral steroids no more than once a year * No hospitalizations  2. Chronic rhinitis - with overlying sinusitis - Continue with nasal ipratropium one spray per nostril every six hours AS NEEDED. - Continue with fluticasone nasal spray two sprays per nostril daily (AIM FOR THE EARS). - Continue with Singulair 10mg  daily.  - Start Augmentin 875 mg twice daily for 10 days.  - Call us early next week with an update.   3. GERD  - Controlled without reflux medications.   4. Return in about 3 months (around 08/05/2021).    Please inform us of any Emergency Department visits, hospitalizations, or changes in symptoms. Call us before going to the ED for breathing or allergy symptoms since we might be able to fit you in for a sick visit. Feel free to contact us anytime with any questions, problems, or concerns.  It was a pleasure to see you again today!  Websites that have reliable patient information: 1. American Academy of Asthma, Allergy, and Immunology: www.aaaai.org 2. Food Allergy Research and Education (FARE): foodallergy.org 3. Mothers of Asthmatics: http://www.asthmacommunitynetwork.org 4. American College of Allergy, Asthma, and Immunology:  www.acaai.org   COVID-19 Vaccine Information can be found at: ShippingScam.co.uk For questions related to vaccine distribution or appointments, please email vaccine@[facility] .com or call [phone].     Like [place] on [employer] [place] and Instagram for our latest updates!       Make sure you are registered to vote! If you have moved or changed any of your contact information, you will need to get this updated before voting!  In some cases, you MAY be able to register to vote online: [email or web address]

## 2021-05-07 NOTE — Progress Notes (Signed)
FOLLOW UP  Date of Service/Encounter:  05/07/21   Assessment:   Moderate persistent asthma, uncomplicated - very stable   Seasonal and perennial allergic rhinitis (trees, mold, dust mite)   Gastroesophageal reflux disease  Plan/Recommendations:   1. Moderate persistent asthma with acute exacerbation - Spirometry not done since you were sick today. - DepoMedrol injection given today. - Start the prednisone pack provided today. - Continue with Breo one puff once daily.  - Daily controller medication(s): Breo 200/65mcg one puff once daily - Prior to physical activity: ProAir 2 puffs 10-15 minutes before physical activity. - Rescue medications: ProAir 4 puffs every 4-6 hours as needed or albuterol nebulizer one vial every 4-6 hours as needed - Asthma control goals:  * Full participation in all desired activities (may need albuterol before activity) * Albuterol use two time or less a week on average (not counting use with activity) * Cough interfering with sleep two time or less a month * Oral steroids no more than once a year * No hospitalizations  2. Chronic rhinitis - with overlying sinusitis - Continue with nasal ipratropium one spray per nostril every six hours AS NEEDED. - Continue with fluticasone nasal spray two sprays per nostril daily (AIM FOR THE EARS). - Continue with Singulair 10mg  daily.  - Start Augmentin 875 mg twice daily for 10 days.  - Call us early next week with an update.   3. GERD  - Controlled without reflux medications.   4. Return in about 3 months (around 08/05/2021).   Subjective:   Tabitha Oneal is a 65 y.o. female presenting today for follow up of  Chief Complaint  Patient presents with   Cough    Cough, sneezing, runny nose, congestion, wheezing. Started about a week ago. Started an OTC medicine and said it did not help. Negative for flu and covid per patient. ACT - Tabitha Oneal has a history of the following: Patient  Active Problem List   Diagnosis Date Noted   Pain in left knee 09/13/2019   Aftercare following left knee joint replacement surgery 07/19/2019   Abnormal EKG 03/02/2019   Incomplete left bundle branch block (LBBB) 03/02/2019   Rebound headache 12/13/2018   Overuse of medication 12/13/2018   Intractable chronic migraine without aura and without status migrainosus 12/13/2018   Microcytic anemia 02/24/2018   High risk medication use 08/06/2017   Seasonal and perennial allergic rhinitis 07/30/2017   Recurrent infections 07/30/2017   Cough 06/08/2017   Moderate persistent asthma without complication 22/29/7989   Chronic constipation 02/04/2017   Hyperlipidemia due to type 2 diabetes mellitus ([place]) 02/04/2017   Hallux rigidus, left foot 01/01/2017   Postmenopausal symptoms 02/22/2016   Arthrofibrosis of knee joint 08/28/2015   Left hip pain 10/08/2014   Type 2 diabetes mellitus without complication, without long-term current use of insulin ([place]) 10/08/2014   Hyperlipidemia LDL goal <100 10/08/2014   Essential hypertension, benign 08/30/2014   Diabetes mellitus, type 2 ([place]) 08/30/2014   Diverticulosis of colon without hemorrhage 08/30/2014   Chronic low back pain 08/30/2014   Chronic pain syndrome 08/30/2014   Asthma in adult without complication 21/19/4174   Contracture of left knee, arthrofibrosis post op TKA 04/09/2012   Osteoarthritis of left knee 02/23/2012    History obtained from: chart review and patient.  Tabitha Oneal is a 65 y.o. female presenting for a sick visit.  We last saw her in August 2022.  At that time, her spirometry looked  great.  We continue with Breo 200 mcg 1 puff once daily as well as albuterol as needed.  For her rhinitis, we will continue with nasal ipratropium as well as Flonase and Singulair.  She has been sick for around one week. Flu and COVID testing was negative. It is all in her chest. She has a lot of chest congestion and is having malaise.  She has not  had a fever.  She has used a lot of over-the-counter medicines including Mucinex without much improvement.  She has not her nose prior.  She remains on all of her maintenance medications.  She has been using some cold and flu medicine as well with transient relief.  Asthma/Respiratory Symptom History: She remains on Breo 1 puff once daily.  Lately she has been using her albuterol 4 puffs every 4-6 hours.  This is been ongoing for period of a week.  Prior to this, her symptoms were under good control without any exacerbations.  She has not been to the emergency room nor she needed prednisone for her breathing.  Allergic Rhinitis Symptom History: She remains on her nasal ipratropium as well as Flonase and Singulair.  She has not needed antibiotics since January 2022, when we provided her with a course of Augmentin.  She did end up having left knee surgery.  It did help with her paresthesias in her lower left leg.  She still needs a cane and she does keep her brace on just in case.  She tends to get fatigued from a muscle standpoint at the end of the day which she has a lot of appointments, so the brace tends to help with that.  Otherwise, there have been no changes to her past medical history, surgical history, family history, or social history.    Review of Systems  Constitutional: Negative.  Negative for chills, fever, malaise/fatigue and weight loss.  HENT: Negative.  Negative for congestion, ear discharge, ear pain and sinus pain.   Eyes:  Negative for pain, discharge and redness.  Respiratory:  Negative for cough, sputum production, shortness of breath and wheezing.   Cardiovascular: Negative.  Negative for chest pain and palpitations.  Gastrointestinal:  Negative for abdominal pain, constipation, diarrhea, heartburn, nausea and vomiting.  Skin: Negative.  Negative for itching and rash.  Neurological:  Negative for dizziness and headaches.  Endo/Heme/Allergies:  Negative for environmental  allergies. Does not bruise/bleed easily.      Objective:   Blood pressure (!) 130/58, pulse 98, temperature 98.2 F (36.8 C), temperature source Temporal, resp. rate 16, height 5\' 2"  (1.575 m), weight 201 lb 6.4 oz (91.4 kg), SpO2 98 %. Body mass index is 36.84 kg/m.   Physical Exam:  Physical Exam Vitals reviewed.  Constitutional:      Appearance: She is well-developed.     Comments: Talkative.  Hearing is not great.  HENT:     Head: Normocephalic and atraumatic.     Right Ear: Tympanic membrane, ear canal and external ear normal.     Left Ear: Tympanic membrane, ear canal and external ear normal.     Nose: No nasal deformity, septal deviation, mucosal edema or rhinorrhea.     Right Turbinates: Enlarged, swollen and pale.     Left Turbinates: Enlarged, swollen and pale.     Right Sinus: No maxillary sinus tenderness or frontal sinus tenderness.     Left Sinus: No maxillary sinus tenderness or frontal sinus tenderness.     Mouth/Throat:     Mouth:  Mucous membranes are not pale and not dry.     Pharynx: Uvula midline.  Eyes:     General: Lids are normal. No allergic shiner.       Right eye: No discharge.        Left eye: No discharge.     Conjunctiva/sclera: Conjunctivae normal.     Right eye: Right conjunctiva is not injected. No chemosis.    Left eye: Left conjunctiva is not injected. No chemosis.    Pupils: Pupils are equal, round, and reactive to light.  Cardiovascular:     Rate and Rhythm: Normal rate and regular rhythm.     Heart sounds: Normal heart sounds.  Pulmonary:     Effort: Pulmonary effort is normal. No tachypnea, accessory muscle usage or respiratory distress.     Breath sounds: Examination of the left-lower field reveals wheezing. Wheezing present. No rhonchi or rales.     Comments: Diffuse wheezes at the bases bilaterally, left greater than right. Chest:     Chest wall: No tenderness.  Lymphadenopathy:     Cervical: No cervical adenopathy.  Skin:     Coloration: Skin is not pale.     Findings: No abrasion, erythema, petechiae or rash. Rash is not papular, urticarial or vesicular.  Neurological:     Mental Status: She is alert.  Psychiatric:        Behavior: Behavior is cooperative.     Diagnostic studies: none (since she was not feeling well)         Salvatore Marvel, MD  [facility]

## 2021-05-24 ENCOUNTER — Ambulatory Visit
Admission: RE | Admit: 2021-05-24 | Discharge: 2021-05-24 | Disposition: A | Discharge status: Home / Self Care | Payer: Medicare Other | Source: Ambulatory Visit | Attending: Student | Admitting: Student

## 2021-05-24 DIAGNOSIS — N390 Urinary tract infection, site not specified: Secondary | ICD-10-CM

## 2021-05-24 MED ORDER — IOPAMIDOL (ISOVUE-300) INJECTION 61%
100.0000 mL | Freq: Once | INTRAVENOUS | Status: AC | PRN
  Administered 2021-05-24: 100 mL via INTRAVENOUS

## 2021-06-14 ENCOUNTER — Other Ambulatory Visit (HOSPITAL_COMMUNITY): Payer: Self-pay | Admitting: Student

## 2021-06-14 DIAGNOSIS — I10 Essential (primary) hypertension: Secondary | ICD-10-CM

## 2021-06-18 ENCOUNTER — Encounter (HOSPITAL_COMMUNITY): Payer: Self-pay | Admitting: Student

## 2021-06-22 ENCOUNTER — Emergency Department
Admission: EM | Admit: 2021-06-22 | Discharge: 2021-06-23 | Disposition: A | Discharge status: Home / Self Care | Payer: Medicare Other | Attending: Emergency Medicine | Admitting: Emergency Medicine

## 2021-06-22 ENCOUNTER — Other Ambulatory Visit: Payer: Self-pay

## 2021-06-22 ENCOUNTER — Encounter: Payer: Self-pay | Admitting: Emergency Medicine

## 2021-06-22 DIAGNOSIS — L509 Urticaria, unspecified: Secondary | ICD-10-CM | POA: Diagnosis not present

## 2021-06-22 DIAGNOSIS — R21 Rash and other nonspecific skin eruption: Secondary | ICD-10-CM | POA: Diagnosis present

## 2021-06-22 MED ORDER — FAMOTIDINE IN NACL 20-0.9 MG/50ML-% IV SOLN
20.0000 mg | Freq: Once | INTRAVENOUS | Status: AC
  Administered 2021-06-22: 20 mg via INTRAVENOUS
  Filled 2021-06-22: qty 50

## 2021-06-22 MED ORDER — DEXAMETHASONE SODIUM PHOSPHATE 10 MG/ML IJ SOLN
10.0000 mg | Freq: Once | INTRAMUSCULAR | Status: DC
  Filled 2021-06-22: qty 1

## 2021-06-22 MED ORDER — FAMOTIDINE 20 MG PO TABS: 20.0000 mg | ORAL_TABLET | Freq: Two times a day (BID) | ORAL | 0 refills | Status: DC

## 2021-06-22 MED ORDER — DEXAMETHASONE SODIUM PHOSPHATE 10 MG/ML IJ SOLN
10.0000 mg | Freq: Once | INTRAMUSCULAR | Status: AC
Start: 2021-06-22 — End: 2021-06-22
  Administered 2021-06-22: 10 mg via INTRAVENOUS

## 2021-06-22 MED ORDER — PREDNISONE 20 MG PO TABS: 40.0000 mg | ORAL_TABLET | Freq: Every day | ORAL | 0 refills | Status: AC

## 2021-06-22 MED ORDER — DIPHENHYDRAMINE HCL 50 MG/ML IJ SOLN
50.0000 mg | Freq: Once | INTRAMUSCULAR | Status: AC
  Administered 2021-06-22: 50 mg via INTRAVENOUS
  Filled 2021-06-22: qty 1

## 2021-06-22 NOTE — ED Triage Notes (Signed)
Pt via POV from home. Pt c/o rash that started from her neck down to her torso, family thinks it is from a perfume she started using. Denies any SOB. Denies throat swelling. Endorses itchiness and pain. Pt is A&Ox4 and NAD.

## 2021-06-22 NOTE — Discharge Instructions (Signed)
You have developed a hives reaction to an unknown allergen.  Take the prescribed famotidine and OTC Benadryl as directed.  You may take the steroid if approved by your orthopedic surgeon.  Follow-up with primary provider or return to the ED for ongoing symptoms.

## 2021-06-22 NOTE — ED Notes (Signed)
See triage note. Pt does have hives that spread to torso. Pt is c/o itching but pt denies any SOB or chest tightness. Pt denies any allergies. Pt stated rash started a couple of days ago and spread.

## 2021-06-22 NOTE — ED Provider Notes (Signed)
[facility] Provider Note  Patient Contact: 11:21 PM (approximate)   History   No chief complaint on file.   HPI  Tabitha Oneal is a 66 y.o. female presents to the ED accompanied by her adult daughter.  Patient presents with generalized rash from her neck to her torso and extremities.  She reports the rash is pruritic in nature.  She believes onset may be due to her recently open bottle of perfume.  The perfume she has had for a while, but just recently opened it.  The rash appears in the areas where she applied the perfume.  Patient denies any difficulty breathing, swallowing, or controlling secretions.  Denies any mucous membrane involvement.   Physical Exam   Triage Vital Signs: ED Triage Vitals  Enc Vitals Group     BP 06/22/21 1841 (!) 158/78     Pulse Rate 06/22/21 1841 83     Resp 06/22/21 1841 20     Temp 06/22/21 1841 98.6 F (37 C)     Temp Source 06/22/21 1841 Oral     SpO2 06/22/21 1841 100 %     Weight 06/22/21 1839 200 lb (90.7 kg)     Height 06/22/21 1839 5\' 2"  (1.575 m)     Head Circumference --      Peak Flow --      Pain Score 06/22/21 1839 0     Pain Loc --      Pain Edu? --      Excl. in [place]? --     Most recent vital signs: Vitals:   06/22/21 1841  BP: (!) 158/78  Pulse: 83  Resp: 20  Temp: 98.6 F (37 C)  SpO2: 100%     General: Alert and in no acute distress. Eyes:  PERRL. EOMI. Nose: No congestion/rhinnorhea. Mouth/Throat: Mucous membranes are moist.  Uvula is midline and tonsils are flat. Hematological/Lymphatic/Immunilogical: No cervical lymphadenopathy. Cardiovascular:  Good peripheral perfusion Respiratory: Normal respiratory effort without tachypnea or retractions. Lungs CTAB.  Musculoskeletal: Full range of motion to all extremities.  Neurologic:  No gross focal neurologic deficits are appreciated.  Skin:   Generalized erythematous maculopapular rash consistent with urticaria noted from the  extremities and trunk. Other:   ED Results / Procedures / Treatments   Labs (all labs ordered are listed, but only abnormal results are displayed) Labs Reviewed - No data to display   EKG   RADIOLOGY   No results found.  PROCEDURES:  Critical Care performed: No  Procedures   MEDICATIONS ORDERED IN ED: Medications  famotidine (PEPCID) IVPB 20 mg premix (20 mg Intravenous New Bag/Given 06/22/21 2223)  diphenhydrAMINE (BENADRYL) injection 50 mg (50 mg Intravenous Given 06/22/21 2217)  dexamethasone (DECADRON) injection 10 mg (10 mg Intravenous Given 06/22/21 2217)     IMPRESSION / MDM / ASSESSMENT AND PLAN / ED COURSE  I reviewed the triage vital signs and the nursing notes.                              Differential diagnosis includes, but is not limited to, urticaria, contact dermatitis, eczema  Patient with ED evaluation of generalized pruritic rash to the extremities and trunk.  Patient notes onset after she used an aerosolized perfume product.  She presents in no acute stress without any signs of anaphylaxis, angioedema, or respiratory distress.  Patient's diagnosis is consistent with urticaria.  She is treated in the ED  with empiric doses of Decadron, famotidine, and Benadryl.  Interim exam reveals decreased itching as the patient is sleeping comfortably in the room.  Patient will be discharged home with prescriptions for prednisone and famotidine.  Patient has a orthopedic procedure scheduled in about 5 days.  As such, she is advised to hold the prednisone until she is given explicit instructions by her orthopedic surgeon.  Patient is to follow up with her PCP as needed or otherwise directed. Patient is given ED precautions to return to the ED for any worsening or new symptoms.   FINAL CLINICAL IMPRESSION(S) / ED DIAGNOSES   Final diagnoses:  Urticaria     Rx / DC Orders   ED Discharge Orders          Ordered    famotidine (PEPCID) 20 MG tablet  2 times daily         06/22/21 2236    predniSONE (DELTASONE) 20 MG tablet  Daily with breakfast        06/22/21 2236             Note:  This document was prepared using Dragon voice recognition software and may include unintentional dictation errors.    Melvenia Needles, PA-C 06/22/21 2326    Carrie Mew, MD 06/22/21 [id number]

## 2021-06-23 NOTE — ED Notes (Signed)
Pt was discharged by ED provider. E-signature not available at this time.

## 2021-07-03 ENCOUNTER — Other Ambulatory Visit: Payer: Self-pay

## 2021-07-03 ENCOUNTER — Ambulatory Visit (HOSPITAL_COMMUNITY): Payer: Medicare Other | Attending: Cardiology

## 2021-07-03 DIAGNOSIS — I10 Essential (primary) hypertension: Secondary | ICD-10-CM

## 2021-07-03 LAB — ECHOCARDIOGRAM COMPLETE
Area-P 1/2: 3.66 cm2
S' Lateral: 2.7 cm

## 2021-07-05 ENCOUNTER — Other Ambulatory Visit: Payer: Self-pay | Admitting: Nurse Practitioner

## 2021-07-09 ENCOUNTER — Ambulatory Visit (INDEPENDENT_AMBULATORY_CARE_PROVIDER_SITE_OTHER): Payer: Medicare Other | Admitting: Allergy & Immunology

## 2021-07-09 ENCOUNTER — Other Ambulatory Visit: Payer: Self-pay

## 2021-07-09 ENCOUNTER — Encounter: Payer: Self-pay | Admitting: Allergy & Immunology

## 2021-07-09 VITALS — BP 140/64 | HR 107 | Temp 96.7°F | Resp 16 | Ht 62.5 in | Wt 203.6 lb

## 2021-07-09 DIAGNOSIS — K219 Gastro-esophageal reflux disease without esophagitis: Secondary | ICD-10-CM | POA: Diagnosis not present

## 2021-07-09 DIAGNOSIS — J3089 Other allergic rhinitis: Secondary | ICD-10-CM

## 2021-07-09 DIAGNOSIS — B999 Unspecified infectious disease: Secondary | ICD-10-CM | POA: Diagnosis not present

## 2021-07-09 DIAGNOSIS — J302 Other seasonal allergic rhinitis: Secondary | ICD-10-CM

## 2021-07-09 DIAGNOSIS — J4540 Moderate persistent asthma, uncomplicated: Secondary | ICD-10-CM

## 2021-07-09 MED ORDER — AMOXICILLIN-POT CLAVULANATE 875-125 MG PO TABS: 1.0000 | ORAL_TABLET | Freq: Two times a day (BID) | ORAL | 0 refills | Status: AC

## 2021-07-09 NOTE — Progress Notes (Signed)
FOLLOW UP  Date of Service/Encounter:  07/09/21   Assessment:   Moderate persistent asthma, uncomplicated    Seasonal and perennial allergic rhinitis (trees, mold, dust mite)   Gastroesophageal reflux disease - adding on a PPI  Anemia  Recurrent infections - getting another immune work-up today  Plan/Recommendations:    1. Moderate persistent asthma, uncomplicated - Spirometry not done today. - We are going to add a GERD medication to see if this helps.  - Daily controller medication(s): Breo 200/50mcg one puff once daily - Prior to physical activity: albuterol 2 puffs 10-15 minutes before physical activity. - Rescue medications: albuterol 4 puffs every 4-6 hours as needed or albuterol nebulizer one vial every 4-6 hours as needed - Asthma control goals:  * Full participation in all desired activities (may need albuterol before activity) * Albuterol use two time or less a week on average (not counting use with activity) * Cough interfering with sleep two time or less a month * Oral steroids no more than once a year * No hospitalizations  2. Chronic rhinitis - with overlying sinusitis - Continue with nasal ipratropium one spray per nostril every six hours AS NEEDED. - Continue with fluticasone nasal spray two sprays per nostril daily (AIM FOR THE EARS). - Continue with Singulair 10mg  daily.  - Start Augmentin 875 mg twice daily for 10 days.  - We are going to get an immune work up again to make sure that your immune system is working well.  - We will call you in 1-2 weeks with the results of the testing.  3. GERD  - Start omeprazole 40mg  daily. - This might help any co-existing reflux that might be making your cough worse.   4. Return in about 3 months (around 10/06/2021).   Subjective:   Tabitha Oneal is a 66 y.o. 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 07/19/2019   Abnormal EKG 03/02/2019   Incomplete left bundle branch block (LBBB) 03/02/2019   Rebound headache 12/13/2018   Overuse of medication 12/13/2018   Intractable chronic migraine without aura and without status migrainosus 12/13/2018   Microcytic anemia 02/24/2018   High risk medication use 08/06/2017   Seasonal and perennial allergic rhinitis 07/30/2017   Recurrent infections 07/30/2017   Cough 06/08/2017   Moderate persistent asthma without complication 51/06/5850   Chronic constipation 02/04/2017   Hyperlipidemia due to type 2 diabetes mellitus ([place]) 02/04/2017   Hallux rigidus, left foot 01/01/2017   Postmenopausal symptoms 02/22/2016   Arthrofibrosis of knee joint 08/28/2015   Left hip pain 10/08/2014   Type 2 diabetes mellitus without complication, without long-term current use of insulin ([place]) 10/08/2014   Hyperlipidemia LDL goal <100 10/08/2014   Essential hypertension, benign 08/30/2014   Diabetes mellitus, type 2 ([place]) 08/30/2014   Diverticulosis of colon without hemorrhage 08/30/2014   Chronic low back pain 08/30/2014   Chronic pain syndrome 08/30/2014   Asthma in adult without complication 77/82/4235   Contracture of left knee, arthrofibrosis post op TKA  04/09/2012   Osteoarthritis of left knee 02/23/2012    History obtained from: chart review and patient.  Tabitha Oneal is a 66 y.o. female presenting for a follow up visit.   She had a procedure done on her ankle in February 2023. She got steroid injections done. She has some back and hip pain going on right now.   She has had two course of Augmentin in 2022 from me for sinusitis.  I reviewed her labs.  We actually did an immune work-up  back in March 2019.  That demonstrated an inadequate response to Streptococcus pneumonia and diphtheria.  She also had low IgA and IgM.  We recommended a Pneumovax and a Tdap but its not clear that this was ever done.  Asthma/Respiratory Symptom History: Her breathing "comes and goes". She has been treating it herself with albuterol.  She remains on the Breo 1 puff once daily.  She has not needed prednisone and has not been to the emergency room for her symptoms.  She does have some breakthrough coughing, but is unclear when or why she has this.  Allergic Rhinitis Symptom History: She has been having some green nasal drainage for around one week or so.  She is having a lot of mucous in her throat and her chest. She has never seen ENT.  She claims only get antibiotics from [place].  She does not go to the emergency room nor urgent care for any of her episodes of sinusitis.   Otherwise, there have been no changes to her past medical history, surgical history, family history, or social history.    Review of Systems  Constitutional: Negative.  Negative for chills, fever, malaise/fatigue and weight loss.  HENT:  Positive for congestion and sinus pain. Negative for ear discharge and ear pain.   Eyes:  Negative for pain, discharge and redness.  Respiratory:  Positive for cough. Negative for sputum production, shortness of breath and wheezing.   Cardiovascular: Negative.  Negative for chest pain and palpitations.  Gastrointestinal:  Negative for abdominal pain, constipation, diarrhea, heartburn, nausea and vomiting.  Skin: Negative.  Negative for itching and rash.  Neurological:  Negative for dizziness and headaches.  Endo/Heme/Allergies:  Negative for environmental allergies. Does not bruise/bleed easily.      Objective:   Blood pressure 140/64, pulse (!) 107, temperature (!) 96.7 F (35.9 C), temperature source Temporal, resp. rate 16, height 5' 2.5" (1.588 m), weight 203 lb 9.6 oz (92.4 kg), SpO2 100  %. Body mass index is 36.65 kg/m.   Physical Exam:  Physical Exam Vitals reviewed.  Constitutional:      Appearance: She is well-developed.     Comments: Talkative.  Hearing is not great.  HENT:     Head: Normocephalic and atraumatic.     Right Ear: Tympanic membrane, ear canal and external ear normal.     Left Ear: Tympanic membrane, ear canal and external ear normal.     Nose: No nasal deformity, septal deviation, mucosal edema or rhinorrhea.     Right Turbinates: Enlarged, swollen and pale.     Left Turbinates: Enlarged, swollen and pale.     Right Sinus: Maxillary sinus tenderness present. No frontal sinus tenderness.     Left Sinus: Maxillary sinus tenderness present. No frontal sinus tenderness.     Mouth/Throat:     Mouth: Mucous membranes are not pale and not dry.     Pharynx: Uvula midline.  Eyes:     General: Lids are normal. No allergic  shiner.       Right eye: No discharge.        Left eye: No discharge.     Conjunctiva/sclera: Conjunctivae normal.     Right eye: Right conjunctiva is not injected. No chemosis.    Left eye: Left conjunctiva is not injected. No chemosis.    Pupils: Pupils are equal, round, and reactive to light.  Cardiovascular:     Rate and Rhythm: Normal rate and regular rhythm.     Heart sounds: Normal heart sounds.  Pulmonary:     Effort: Pulmonary effort is normal. No tachypnea, accessory muscle usage or respiratory distress.     Breath sounds: Examination of the left-lower field reveals wheezing. Wheezing present. No rhonchi or rales.     Comments: Moving air well in all lung fields.  No increased work of breathing noted. Chest:     Chest wall: No tenderness.  Lymphadenopathy:     Cervical: No cervical adenopathy.  Skin:    Coloration: Skin is not pale.     Findings: No abrasion, erythema, petechiae or rash. Rash is not papular, urticarial or vesicular.  Neurological:     Mental Status: She is alert.  Psychiatric:        Behavior:  Behavior is cooperative.     Diagnostic studies: labs sent       Salvatore Marvel, MD  [facility] [place] [facility]

## 2021-07-09 NOTE — Patient Instructions (Addendum)
1. Moderate persistent asthma, uncomplicated - Spirometry not done today. - We are going to add a GERD medication to see if this helps.  - Daily controller medication(s): Breo 200/7mcg one puff once daily - Prior to physical activity: albuterol 2 puffs 10-15 minutes before physical activity. - Rescue medications: albuterol 4 puffs every 4-6 hours as needed or albuterol nebulizer one vial every 4-6 hours as needed - Asthma control goals:  * Full participation in all desired activities (may need albuterol before activity) * Albuterol use two time or less a week on average (not counting use with activity) * Cough interfering with sleep two time or less a month * Oral steroids no more than once a year * No hospitalizations  2. Chronic rhinitis - with overlying sinusitis - Continue with nasal ipratropium one spray per nostril every six hours AS NEEDED. - Continue with fluticasone nasal spray two sprays per nostril daily (AIM FOR THE EARS). - Continue with Singulair 10mg  daily.  - Start Augmentin 875 mg twice daily for 10 days.  - We are going to get an immune work up again to make sure that your immune system is working well.  - We will call you in 1-2 weeks with the results of the testing.  3. GERD  - Start omeprazole 40mg  daily. - This might help any co-existing reflux that might be making your cough worse.   4. Return in about 3 months (around 10/06/2021).    Please inform us of any Emergency Department visits, hospitalizations, or changes in symptoms. Call us before going to the ED for breathing or allergy symptoms since we might be able to fit you in for a sick visit. Feel free to contact us anytime with any questions, problems, or concerns.  It was a pleasure to see you again today!  Websites that have reliable patient information: 1. American Academy of Asthma, Allergy, and Immunology: www.aaaai.org 2. Food Allergy Research and Education (FARE): foodallergy.org 3. Mothers of  Asthmatics: http://www.asthmacommunitynetwork.org 4. American College of Allergy, Asthma, and Immunology: www.acaai.org   COVID-19 Vaccine Information can be found at: ShippingScam.co.uk For questions related to vaccine distribution or appointments, please email [email or web address] or call [phone].     Like [place] on [place] and Instagram for our latest updates!       Make sure you are registered to vote! If you have moved or changed any of your contact information, you will need to get this updated before voting!  In some cases, you MAY be able to register to vote online: [email or web address]

## 2021-07-16 ENCOUNTER — Ambulatory Visit: Payer: Medicare Other | Admitting: Allergy & Immunology

## 2021-07-17 ENCOUNTER — Other Ambulatory Visit: Payer: Self-pay | Admitting: Allergy & Immunology

## 2021-07-17 LAB — STREP PNEUMONIAE 23 SEROTYPES IGG
Pneumo Ab Type 1*: 0.3 ug/mL — ABNORMAL LOW (ref >1.3)
Pneumo Ab Type 12 (12F)*: <0.1 ug/mL — ABNORMAL LOW (ref >1.3)
Pneumo Ab Type 14*: 1.6 ug/mL (ref >1.3)
Pneumo Ab Type 17 (17F)*: <0.1 ug/mL — ABNORMAL LOW (ref >1.3)
Pneumo Ab Type 19 (19F)*: 2 ug/mL (ref >1.3)
Pneumo Ab Type 2*: 0.3 ug/mL — ABNORMAL LOW (ref >1.3)
Pneumo Ab Type 20*: 0.1 ug/mL — ABNORMAL LOW (ref >1.3)
Pneumo Ab Type 22 (22F)*: <0.1 ug/mL — ABNORMAL LOW (ref >1.3)
Pneumo Ab Type 23 (23F)*: <0.1 ug/mL — ABNORMAL LOW (ref >1.3)
Pneumo Ab Type 26 (6B)*: 0.4 ug/mL — ABNORMAL LOW (ref >1.3)
Pneumo Ab Type 3*: 0.1 ug/mL — ABNORMAL LOW (ref >1.3)
Pneumo Ab Type 34 (10A)*: <0.1 ug/mL — ABNORMAL LOW (ref >1.3)
Pneumo Ab Type 4*: <0.1 ug/mL — ABNORMAL LOW (ref >1.3)
Pneumo Ab Type 43 (11A)*: <0.1 ug/mL — ABNORMAL LOW (ref >1.3)
Pneumo Ab Type 5*: <0.1 ug/mL — ABNORMAL LOW (ref >1.3)
Pneumo Ab Type 51 (7F)*: 3.5 ug/mL (ref >1.3)
Pneumo Ab Type 54 (15B)*: 0.5 ug/mL — ABNORMAL LOW (ref >1.3)
Pneumo Ab Type 56 (18C)*: <0.1 ug/mL — ABNORMAL LOW (ref >1.3)
Pneumo Ab Type 57 (19A)*: 3.1 ug/mL (ref >1.3)
Pneumo Ab Type 68 (9V)*: 0.5 ug/mL — ABNORMAL LOW (ref >1.3)
Pneumo Ab Type 70 (33F)*: 0.3 ug/mL — ABNORMAL LOW (ref >1.3)
Pneumo Ab Type 8*: 2.4 ug/mL (ref >1.3)
Pneumo Ab Type 9 (9N)*: 2.3 ug/mL (ref >1.3)

## 2021-07-17 LAB — CBC WITH DIFFERENTIAL
Basophils Absolute: 0.1 10*3/uL (ref 0.0–0.2)
Basos: 1 %
EOS (ABSOLUTE): 0.2 10*3/uL (ref 0.0–0.4)
Eos: 2 %
Hematocrit: 38.9 % (ref 34.0–46.6)
Hemoglobin: 12.3 g/dL (ref 11.1–15.9)
Immature Grans (Abs): 0 10*3/uL (ref 0.0–0.1)
Immature Granulocytes: 0 %
Lymphocytes Absolute: 1.5 10*3/uL (ref 0.7–3.1)
Lymphs: 17 %
MCH: 25.4 pg — ABNORMAL LOW (ref 26.6–33.0)
MCHC: 31.6 g/dL (ref 31.5–35.7)
MCV: 80 fL (ref 79–97)
Monocytes Absolute: 0.4 10*3/uL (ref 0.1–0.9)
Monocytes: 4 %
Neutrophils Absolute: 7 10*3/uL (ref 1.4–7.0)
Neutrophils: 76 %
RBC: 4.84 x10E6/uL (ref 3.77–5.28)
RDW: 14.4 % (ref 11.7–15.4)
WBC: 9.1 10*3/uL (ref 3.4–10.8)

## 2021-07-17 LAB — COMPLEMENT, TOTAL: Compl, Total (CH50): >60 U/mL (ref >41)

## 2021-07-17 LAB — DIPHTHERIA / TETANUS ANTIBODY PANEL
Diphtheria Ab: 0.15 IU/mL (ref <0.10)
Tetanus Ab, IgG: 0.43 IU/mL (ref <0.10)

## 2021-07-17 LAB — IGG, IGA, IGM
IgA/Immunoglobulin A, Serum: 75 mg/dL — ABNORMAL LOW (ref 87–352)
IgG (Immunoglobin G), Serum: 1736 mg/dL — ABNORMAL HIGH (ref 586–1602)
IgM (Immunoglobulin M), Srm: 22 mg/dL — ABNORMAL LOW (ref 26–217)

## 2021-08-13 ENCOUNTER — Other Ambulatory Visit: Payer: Self-pay | Admitting: Nurse Practitioner

## 2021-09-02 ENCOUNTER — Other Ambulatory Visit: Payer: Self-pay | Admitting: Nurse Practitioner

## 2021-09-03 ENCOUNTER — Other Ambulatory Visit: Payer: Self-pay | Admitting: Nurse Practitioner

## 2021-10-08 ENCOUNTER — Encounter: Payer: Self-pay | Admitting: Allergy & Immunology

## 2021-10-08 ENCOUNTER — Ambulatory Visit (INDEPENDENT_AMBULATORY_CARE_PROVIDER_SITE_OTHER): Payer: Medicare Other | Admitting: Allergy & Immunology

## 2021-10-08 ENCOUNTER — Telehealth: Payer: Self-pay | Admitting: Allergy & Immunology

## 2021-10-08 VITALS — BP 142/60 | HR 93 | Temp 97.3°F | Resp 16 | Ht 62.6 in | Wt 207.6 lb

## 2021-10-08 DIAGNOSIS — K219 Gastro-esophageal reflux disease without esophagitis: Secondary | ICD-10-CM

## 2021-10-08 DIAGNOSIS — J4540 Moderate persistent asthma, uncomplicated: Secondary | ICD-10-CM | POA: Diagnosis not present

## 2021-10-08 DIAGNOSIS — J3089 Other allergic rhinitis: Secondary | ICD-10-CM | POA: Diagnosis not present

## 2021-10-08 DIAGNOSIS — J302 Other seasonal allergic rhinitis: Secondary | ICD-10-CM

## 2021-10-08 MED ORDER — FLUTICASONE FUROATE-VILANTEROL 200-25 MCG/ACT IN AEPB: 1.0000 | INHALATION_SPRAY | Freq: Every day | RESPIRATORY_TRACT | 5 refills | Status: DC

## 2021-10-08 MED ORDER — PROAIR RESPICLICK 108 (90 BASE) MCG/ACT IN AEPB: INHALATION_SPRAY | RESPIRATORY_TRACT | 2 refills | Status: DC

## 2021-10-08 MED ORDER — LEVOCETIRIZINE DIHYDROCHLORIDE 5 MG PO TABS: 5.0000 mg | ORAL_TABLET | Freq: Every evening | ORAL | 5 refills | Status: DC

## 2021-10-08 MED ORDER — BEPOTASTINE BESILATE 1.5 % OP SOLN: 1.0000 [drp] | Freq: Two times a day (BID) | OPHTHALMIC | 5 refills | Status: AC

## 2021-10-08 NOTE — Telephone Encounter (Signed)
Tabitha Oneal states when she went to the pharmacy, they did not give her the eye drops she was supposed to receive.  They gave her Montelukast instead.  Tabitha Oneal would like to know if the prescription can be called in again?  [facility] [address].  ?

## 2021-10-08 NOTE — Patient Instructions (Addendum)
1. Moderate persistent asthma, uncomplicated ?- Spirometry not done today. ?- Daily controller medication(s): Breo 200/80mg one puff once daily ?- Prior to physical activity: albuterol 2 puffs 10-15 minutes before physical activity. ?- Rescue medications: albuterol 4 puffs every 4-6 hours as needed or albuterol nebulizer one vial every 4-6 hours as needed ?- Asthma control goals:  ?* Full participation in all desired activities (may need albuterol before activity) ?* Albuterol use two time or less a week on average (not counting use with activity) ?* Cough interfering with sleep two time or less a month ?* Oral steroids no more than once a year ?* No hospitalizations ? ?2. Chronic rhinitis - with overlying sinusitis ?- Continue with nasal ipratropium one spray per nostril every six hours AS NEEDED. ?- Continue with fluticasone nasal spray two sprays per nostril daily (AIM FOR THE EARS). ?- Continue with Singulair '10mg'$  daily.  ?- Start Bepreve eye drops one drop per eye twice daily.  ?- Start Xyzal 5 mg daily.  ?- Start prednisone pack provided to give yourself some quicker relief.  ? ?3. GERD  ?- Continue omeprazole '40mg'$  daily. ? ?4. Return in about 6 months (around 04/10/2022).  ? ? ?Please inform [place] any Emergency Department visits, hospitalizations, or changes in symptoms. Call [place] going to the ED for breathing or allergy symptoms since we might be able to fit you in for a sick visit. Feel free to contact u[place]anytime with any questions, problems, or concerns. ? ?It was a pleasure to see you again today! ? ?Websites that have reliable patient information: ?1. American Academy of Asthma, Allergy, and Immunology: www.aaaai.org ?2. Food Allergy Research and Education (FARE): foodallergy.org ?3. Mothers of Asthmatics: http://www.asthmacommunitynetwork.org ?4. [employer] Allergy, Asthma, and Immunology: wMonthlyElectricBill.co.uk? ? ?COVID-19 Vaccine Information can be found at:  hShippingScam.co.ukFor questions related to vaccine distribution or appointments, please email [email or web address] or call [phone]  ? ? ? ??Like? [facility] Facebook and Instagram for our latest updates!  ?  ? ? ? ?Make sure you are registered to vote! If you have moved or changed any of your contact information, you will need to get this updated before voting! ? ?In some cases, you MAY be able to register to vote online: [email or web address]? ? ? ? ?

## 2021-10-08 NOTE — Progress Notes (Signed)
? ?FOLLOW UP ? ?Date of Service/Encounter:  10/08/21 ? ? ?Assessment:  ? ?Moderate persistent asthma, uncomplicated  ?  ?Seasonal and perennial allergic rhinitis (trees, mold, dust mite) ?  ?Gastroesophageal reflux disease - adding on a PPI ?  ?Anemia ?  ?Recurrent infections - most recent workup within normal limits ? ?Plan/Recommendations:  ? ?1. Moderate persistent asthma, uncomplicated ?- Spirometry not done today. ?- Daily controller medication(s): Breo 200/28mg one puff once daily ?- Prior to physical activity: albuterol 2 puffs 10-15 minutes before physical activity. ?- Rescue medications: albuterol 4 puffs every 4-6 hours as needed or albuterol nebulizer one vial every 4-6 hours as needed ?- Asthma control goals:  ?* Full participation in all desired activities (may need albuterol before activity) ?* Albuterol use two time or less a week on average (not counting use with activity) ?* Cough interfering with sleep two time or less a month ?* Oral steroids no more than once a year ?* No hospitalizations ? ?2. Chronic rhinitis - with overlying sinusitis ?- Continue with nasal ipratropium one spray per nostril every six hours AS NEEDED. ?- Continue with fluticasone nasal spray two sprays per nostril daily (AIM FOR THE EARS). ?- Continue with Singulair '10mg'$  daily.  ?- Start Bepreve eye drops one drop per eye twice daily.  ?- Start Xyzal 5 mg daily.  ?- Start prednisone pack provided to give yourself some quicker relief.  ? ?3. GERD  ?- Continue omeprazole '40mg'$  daily. ? ?4. Return in about 6 months (around 04/10/2022).  ? ? ? ?Subjective:  ? ?Tabitha MANJENETTE GERBINOis a 66y.o. female presenting today for follow up of  ?Chief Complaint  ?Patient presents with  ? Asthma  ?  Been doing alright.  ? Allergic Rhinitis   ?  Sneezing a lot, nose running, eyes irritated and burning all the time  ? ? ?Tabitha MSHAVONTA GOSSENhas a history of the following: ?Patient Active Problem List  ? Diagnosis Date Noted  ? Gastroesophageal  reflux disease 07/09/2021  ? Pain in left knee 09/13/2019  ? 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 Abnormal EKG 03/02/2019  ? Incomplete left bundle branch block (LBBB) 03/02/2019  ? Rebound headache 12/13/2018  ? Overuse of medication 12/13/2018  ? Intractable chronic migraine without aura and without status migrainosus 12/13/2018  ? Microcytic anemia 02/24/2018  ? High risk medication use 08/06/2017  ? Seasonal and perennial allergic rhinitis 07/30/2017  ? Recurrent infections 07/30/2017  ? Cough 06/08/2017  ? Moderate persistent asthma without complication 032/04/2481 ? Chronic constipation 02/04/2017  ? Hyperlipidemia due to type 2 diabetes mellitus ([place] 02/04/2017  ? Hallux rigidus, left foot 01/01/2017  ? Postmenopausal symptoms 02/22/2016  ? Arthrofibrosis of knee joint 08/28/2015  ? Left hip pain 10/08/2014  ? Type 2 diabetes mellitus without complication, without long-term current use of insulin ([place] 10/08/2014  ? Hyperlipidemia LDL goal <100 10/08/2014  ? Essential hypertension, benign 08/30/2014  ? Diabetes mellitus, type 2 ([facility] 08/30/2014  ? Diverticulosis of colon without hemorrhage 08/30/2014  ? Chronic low back pain 08/30/2014  ? Chronic pain syndrome 08/30/2014  ? Asthma in adult without complication 050/07/7046 ? Contracture of left knee, arthrofibrosis post op TKA 04/09/2012  ? Osteoarthritis of left knee 02/23/2012  ? ? ?History obtained from: chart review and patient. ? ?Tabitha Oneal a 66y.o. female presenting for a follow up visit. She was last seen in February 2023. At that time, we did not do a  spirometry. We continued with Breo 216mg one puff once daily and added on omeprazole to see if this would help with improving her asthma control. For her rhinitis, we continued with the nasal Atrovent as well as Flonase as needed. We also continued with the Singulair. We started her on Augmentin due to a sinusitis. We did an immune workup which was largely normal.   ? ?Since the last visit, she has done very well.  ? ?Asthma/Respiratory Symptom History: She remains on the Breo 200 mcg 1 puff once daily.  This combination seems to be working well for her.  She has not been using her albuterol much at all.  Overall, asthma is under good control.    ? ?Allergic Rhinitis Symptom History: Her eyes have been burning and itching for a long period of time.  She has tried [facility]port and exercise psychologist She remains on her Flonase daily. She is on Singulair daily. She is not sure that she uses an antihistamine.  She denies any photophobia, fever, or pain with eye movement. ? ?GERD Symptom History: She remains on her omeprazole 40 mg daily. This did help with her breathing and her coughing. ? ?She had a steroid injection in her left knee which she gets every 6 months.  This is helped, but she still uses a cane. ? ?Otherwise, there have been no changes to her past medical history, surgical history, family history, or social history. ? ? ? ?Review of Systems  ?Constitutional: Negative.  Negative for chills, fever, malaise/fatigue and weight loss.  ?HENT:  Positive for congestion and sinus pain. Negative for ear discharge and ear pain.   ?Eyes:  Positive for discharge and redness. Negative for pain.  ?Respiratory:  Positive for cough. Negative for sputum production, shortness of breath and wheezing.   ?Cardiovascular: Negative.  Negative for chest pain and palpitations.  ?Gastrointestinal:  Negative for abdominal pain, constipation, diarrhea, heartburn, nausea and vomiting.  ?Skin: Negative.  Negative for itching and rash.  ?Neurological:  Negative for dizziness and headaches.  ?Endo/Heme/Allergies:  Positive for environmental allergies. Does not bruise/bleed easily.   ? ? ? ?Objective:  ? ?Blood pressure (!) 142/60, pulse 93, temperature (!) 97.3 ?F (36.3 ?C), temperature source Temporal, resp. rate 16, height 5' 2.6" (1.59 m), weight 207 lb 9.6 oz (94.2 kg), SpO2 97 %. ?Body mass index is 37.25  kg/m?. ? ? ? ?Physical Exam ?Vitals reviewed.  ?Constitutional:   ?   Appearance: She is well-developed.  ?   Comments: Talkative.  Hearing is not great.  ?HENT:  ?   Head: Normocephalic and atraumatic.  ?   Right Ear: Tympanic membrane, ear canal and external ear normal.  ?   Left Ear: Tympanic membrane, ear canal and external ear normal.  ?   Nose: No nasal deformity, septal deviation, mucosal edema or rhinorrhea.  ?   Right Turbinates: Enlarged, swollen and pale.  ?   Left Turbinates: Enlarged, swollen and pale.  ?   Right Sinus: Maxillary sinus tenderness present. No frontal sinus tenderness.  ?   Left Sinus: Maxillary sinus tenderness present. No frontal sinus tenderness.  ?   Mouth/Throat:  ?   Mouth: Mucous membranes are not pale and not dry.  ?   Pharynx: Uvula midline.  ?Eyes:  ?   General: Lids are normal. Allergic shiner present.     ?   Right eye: No discharge.     ?   Left eye: No discharge.  ?   Conjunctiva/sclera:  Conjunctivae normal.  ?   Right eye: Right conjunctiva is not injected. No chemosis. ?   Left eye: Left conjunctiva is not injected. No chemosis. ?   Pupils: Pupils are equal, round, and reactive to light.  ?   Comments: No injection.  She does have excoriations from itching around her eyes bilaterally.  There is some mild puffiness.  ?Cardiovascular:  ?   Rate and Rhythm: Normal rate and regular rhythm.  ?   Heart sounds: Normal heart sounds.  ?Pulmonary:  ?   Effort: Pulmonary effort is normal. No tachypnea, accessory muscle usage or respiratory distress.  ?   Breath sounds: Examination of the left-lower field reveals wheezing. Wheezing present. No rhonchi or rales.  ?   Comments: Moving air well in all lung fields.  No increased work of breathing noted. ?Chest:  ?   Chest wall: No tenderness.  ?Lymphadenopathy:  ?   Cervical: No cervical adenopathy.  ?Skin: ?   Coloration: Skin is not pale.  ?   Findings: No abrasion, erythema, petechiae or rash. Rash is not papular, urticarial or  vesicular.  ?Neurological:  ?   Mental Status: She is alert.  ?Psychiatric:     ?   Behavior: Behavior is cooperative.  ?  ? ?Diagnostic studies:   ? ?Spirometry: results normal (FEV1: 1.65/94%, FVC: 2.14/95%, FEV1

## 2021-10-08 NOTE — Telephone Encounter (Signed)
Thanks, Trayce!  ? ?Salvatore Marvel, MD ?[facility] ? ?

## 2021-10-08 NOTE — Telephone Encounter (Signed)
Called the pharmacy on the behalf of the patient. Pharmacist stated that the pharmacy did not have the eye drops at the time the patient went to pick up her medication. Pharmacy has placed an order for the medication and will let the patient know when the medication has come in. Tried to call the patient to let her know this information. Patient did not answer. LVM for patient to give the office a call back about her prescription.  ?

## 2021-10-25 ENCOUNTER — Other Ambulatory Visit: Payer: Self-pay | Admitting: Internal Medicine

## 2021-10-25 DIAGNOSIS — Z1231 Encounter for screening mammogram for malignant neoplasm of breast: Secondary | ICD-10-CM

## 2021-11-10 ENCOUNTER — Other Ambulatory Visit: Payer: Self-pay | Admitting: Allergy & Immunology

## 2021-11-21 ENCOUNTER — Ambulatory Visit: Payer: Medicare Other

## 2022-01-17 ENCOUNTER — Other Ambulatory Visit: Payer: Self-pay | Admitting: Nurse Practitioner

## 2022-02-17 ENCOUNTER — Other Ambulatory Visit: Payer: Self-pay

## 2022-02-17 ENCOUNTER — Encounter: Payer: Self-pay | Admitting: Physical Therapy

## 2022-02-17 ENCOUNTER — Ambulatory Visit: Payer: Medicare Other | Attending: Infectious Diseases | Admitting: Physical Therapy

## 2022-02-17 DIAGNOSIS — R293 Abnormal posture: Secondary | ICD-10-CM | POA: Diagnosis present

## 2022-02-17 DIAGNOSIS — M5459 Other low back pain: Secondary | ICD-10-CM | POA: Diagnosis present

## 2022-02-17 DIAGNOSIS — R2689 Other abnormalities of gait and mobility: Secondary | ICD-10-CM | POA: Insufficient documentation

## 2022-02-17 DIAGNOSIS — M6281 Muscle weakness (generalized): Secondary | ICD-10-CM | POA: Diagnosis present

## 2022-02-17 NOTE — Therapy (Signed)
OUTPATIENT PHYSICAL THERAPY THORACOLUMBAR EVALUATION   Patient Name: Tabitha Oneal MRN: [id number] DOB:[birth year], 66 y.o., female Today's Date: 02/17/2022   PT End of Session - 02/17/22 1418     Visit Number 1    Number of Visits 7    Date for PT Re-Evaluation 03/31/22    Authorization Type [employer]    Progress Note Due on Visit 10    PT Start Time 1420    PT Stop Time 1509    PT Time Calculation (min) 49 min    Equipment Utilized During Treatment Other (comment)   single point cane   Activity Tolerance Patient limited by pain    Behavior During Therapy WFL for tasks assessed/performed             Past Medical History:  Diagnosis Date   Asthma    QVAR daily and Albuterol as needed   Bladder infection    taking Keflex daily    Chronic back pain    reason unknown   Diabetes mellitus    takes Metformin and Tradjenta daily   GERD (gastroesophageal reflux disease)    takes Omeprazole daily   Hard of hearing    Headache    daily.Takes Excedrine daily   Hepatitis C    History of blood transfusion    no abnormal reaction noted   Hyperlipidemia    takes Atorvastatin daily   Hypertension    takes Quinapril daily   Joint pain    Joint swelling    Nocturia    Obesity    Osteoarthritis of left knee 02/23/2012   Seasonal allergies    takes Claritin and Singulair daily.Nasal spray as needed   Shortness of breath dyspnea    with exertion   Past Surgical History:  Procedure Laterality Date   ABDOMINAL HYSTERECTOMY     EYE SURGERY  2021   INJECTION KNEE  04/09/2012   Procedure: KNEE INJECTION;  Surgeon: Johnny Bridge, MD;  Location: [facility];  Service: Orthopedics;  Laterality: Left;   JOINT REPLACEMENT     acl   bil knees   KNEE ARTHROTOMY Left 08/28/2015   Procedure: LEFT KNEE ARTHROFIBROSIS EXCISION; pylectomy;  Surgeon: Meredith Pel, MD;  Location: [facility];  Service: Orthopedics;  Laterality: Left;   KNEE CLOSED REDUCTION   04/09/2012   Procedure: CLOSED MANIPULATION KNEE;  Surgeon: Johnny Bridge, MD;  Location: [facility];  Service: Orthopedics;  Laterality: Left;  Manipulation Knee with Anesthesia includes Application of Traction    NERVE SURGERY Left 11/06/2020   Per patient on left knee   TOTAL KNEE ARTHROPLASTY  02/23/2012   Procedure: TOTAL KNEE ARTHROPLASTY;  Surgeon: Johnny Bridge, MD;  Location: [facility];  Service: Orthopedics;  Laterality: Left;   Patient Active Problem List   Diagnosis Date Noted   Gastroesophageal reflux disease 07/09/2021   Pain in left knee 09/13/2019   Aftercare following left knee joint replacement surgery 07/19/2019   Abnormal EKG 03/02/2019   Incomplete left bundle branch block (LBBB) 03/02/2019   Rebound headache 12/13/2018   Overuse of medication 12/13/2018   Intractable chronic migraine without aura and without status migrainosus 12/13/2018   Microcytic anemia 02/24/2018   High risk medication use 08/06/2017   Seasonal and perennial allergic rhinitis 07/30/2017   Recurrent infections 07/30/2017   Cough 06/08/2017   Moderate persistent asthma without complication 53/61/4431   Chronic constipation 02/04/2017   Hyperlipidemia due to type 2 diabetes mellitus ([place])  02/04/2017   Hallux rigidus, left foot 01/01/2017   Postmenopausal symptoms 02/22/2016   Arthrofibrosis of knee joint 08/28/2015   Left hip pain 10/08/2014   Type 2 diabetes mellitus without complication, without long-term current use of insulin ([place]) 10/08/2014   Hyperlipidemia LDL goal <100 10/08/2014   Essential hypertension, benign 08/30/2014   Diabetes mellitus, type 2 ([place]) 08/30/2014   Diverticulosis of colon without hemorrhage 08/30/2014   Chronic low back pain 08/30/2014   Chronic pain syndrome 08/30/2014   Asthma in adult without complication 88/91/6945   Contracture of left knee, arthrofibrosis post op TKA 04/09/2012   Osteoarthritis of left knee 02/23/2012    PCP: Thomes Dinning MD  REFERRING PROVIDER: Thomes Dinning MD  REFERRING DIAG: Chronic low back pain   Rationale for Evaluation and Treatment Rehabilitation  THERAPY DIAG:  Other low back pain  Muscle weakness (generalized)  Abnormal posture  Other abnormalities of gait and mobility  ONSET DATE: "off and on" for a few years  SUBJECTIVE:                                                                                                                                                                                           SUBJECTIVE STATEMENT: Gradual onset several years ago. Pt reports increased difficulty with daily activities such as cooking, cleaning, and other chores. Pt states she has difficulty picking up items such as a 5# bag of sugar. Pt states symptoms feel like burning across low back when they are worst, take a little while to calm down after a rest break. Difficulty sleeping. Pt also reports trouble with L knee after previous replacement. Pt states she may be having a nerve stimulator procedure sometime soon. Mostly ambulates with cane. Pt reports tingling in L foot at times. Reports some functional incontinence over past few months, states pain keeps her from getting to bathroom fast enough. Denies specific inability to control bladder/bowels,  denies saddle anesthesia or other red flag symptoms.  PERTINENT HISTORY:  Asthma, DM, GERD, HTN, cardiac  PAIN:  Are you having pain: Yes, 7/10 Location: B low back, sometimes towards upper back  How would you describe your pain? Pressure, ache Best in past week: 7/10 Worst in past week: >10/10 Aggravating factors: lying on back, standing >15-53mn, bending down to pick things up, reaching overhead, prolonged sitting, difficulty with lower body Easing factors: ice pack, rest   PRECAUTIONS: Fall  WEIGHT BEARING RESTRICTIONS No  FALLS:  Has patient fallen in last 6 months? Yes. Number of falls 1 fall earlier this year, while walking  to mailbox, tripped and fell   LIVING ENVIRONMENT: Lives  with: lives alone Lives in: House/apartment Stairs: none Has following equipment at home: Single point cane, rollator, rolling walker   OCCUPATION: not working  PLOF: Independent with household mobility with device and Independent with community mobility with device  PATIENT GOALS want to get better at everything, reduce pain levels   OBJECTIVE:   DIAGNOSTIC FINDINGS:  "MRI LUMBAR SPINE WITHOUT CONTRAST 01/01/2022 FINDINGS: Segmentation: Numbering is kept the same as on the prior study for consistency assuming hypoplastic ribs at T12. by this convention, there is a partially sacralized L5.   Alignment: Trace retrolisthesis at L2-L3.   Vertebrae: Similar vertebral body heights with mild degenerative endplate irregularity. No marrow edema. No suspicious osseous lesion.   Conus medullaris and cauda equina: Conus extends to the L1 level. Conus and cauda equina appear normal.   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   L1-L2: No canal or foraminal stenosis.   L2-L3: Disc bulge. Mild facet arthropathy. Mild canal stenosis. No foraminal stenosis.   L3-L4: Disc bulge. Mild facet arthropathy with ligamentum flavum infolding. Mild canal stenosis. Minor foraminal stenosis.   L4-L5: Disc bulge with small central annular fissure. Moderate facet arthropathy with ligamentum flavum infolding. Mild canal stenosis. Mild right foraminal stenosis. No left foraminal stenosis.   L5-S1: Transitional level by numbering convention with hypoplastic disc. No stenosis.   IMPRESSION: Multilevel degenerative changes as detailed above are similar to the prior study. No high-grade stenosis."  PATIENT SURVEYS:  FOTO 33  SCREENING FOR RED FLAGS: Denies red flag symptoms  COGNITION:  Overall cognitive status: Within functional limits for tasks  assessed     SENSATION: WFL B LE    POSTURE: rounded shoulders, forward head, decreased lumbar lordosis, and flexed trunk   PALPATION: Deferred on this date due to time constraints  LUMBAR ROM:   Active  A/PROM  eval  Flexion 25% painful  Extension <10% painful  Right lateral flexion   Left lateral flexion   Right rotation   Left rotation    (Blank rows = not tested) Comments: Remainder NT due to symptom irritability   LOWER EXTREMITY MMT:    MMT Right eval Left eval  Hip flexion 4+ 3+  Hip abduction (modified sitting) 5 4-  Hip internal rotation    Hip external rotation    Knee flexion    Knee extension     (Blank rows = not tested)  Comments:mild pain with L abduction, "pressure" in low back with hip flexion B but denies overt pain  LUMBAR SPECIAL TESTS:  ? + slump on LLE, negative on R  FUNCTIONAL TESTS:  STS: from standard chair, B UE support required. Truncal rotation towards L although increased WB through RLE with LLE placed significantly anterior d/t reduced knee ROM  30sec STS: deferred on initial evaluation due to symptom irritability with exam, plan to assess as able/appropriate   GAIT: Distance walked: within clinic Assistive device utilized: Single point cane Level of assistance: Modified independence Comments: Pt demos step to pattern leading with RLE, increased truncal rotation, fwd flexed posture, reduced L knee ROM throughout all phases of gait, and reduced gait speed. No overt instability on level ground within clinic but significant compensatory movements noted, intermittent tendency for steadying on nearby objects    TODAY'S TREATMENT  [facility] Adult PT Treatment:  DATE: 02/17/2022  Therapeutic Exercise: Seated TA activation x10 seated (attempted seated pelvic tilts, discontinued after 4 repetitions due to poor tolerance)     PATIENT EDUCATION:  Education details: Pt education on PT  impairments, prognosis, and POC. Rationale for interventions, safe/appropriate HEP performance. Discussed possible benefit of utilizing RW for improved stability and Magazine features editor Person educated: Patient Education method: Explanation, Demonstration, Tactile cues, Verbal cues, and Handouts Education comprehension: verbalized understanding, returned demonstration, verbal cues required, tactile cues required, and needs further education    HOME EXERCISE PROGRAM: Access Code: [email or web address] URL: [email or web address] Date: 02/17/2022 Prepared by: Enis Slipper  Exercises - Seated Transversus Abdominis Bracing  - 1 x daily - 7 x weekly - 3 sets - 10 reps  ASSESSMENT:  CLINICAL IMPRESSION: Patient is a 66 y.o. woman who was seen today for physical therapy evaluation and treatment for chronic low back pain. Pt reports difficulty with majority of daily activities due to pain, including ambulation, cleaning, cooking, transfers, and lower body dressing. Also reports reduced positional tolerance and difficulty sleeping, reports 1 fall in past few months. Pt also has hx of L knee mobility deficits s/p TKA that appear to be affecting functional kinematics. During today's session pt demonstrates reduced lumbar mobility and B LE strength which are limiting ability to perform aforementioned activities, although examination is limited by symptom irritability. Poor tolerance to seated pelvic tilts although pt does report improved symptoms after seated TA activation for HEP, pt departs with report of improved symptoms compared to arrival. Recommend skilled PT to address aforementioned deficits to improve functional independence/tolerance.    OBJECTIVE IMPAIRMENTS Abnormal gait, decreased activity tolerance, decreased balance, decreased endurance, decreased knowledge of use of DME, decreased mobility, difficulty walking, decreased ROM, decreased strength, and pain.   ACTIVITY LIMITATIONS carrying,  lifting, bending, sitting, standing, squatting, sleeping, transfers, bathing, toileting, dressing, and reach over head  PARTICIPATION LIMITATIONS: meal prep, cleaning, laundry, driving, and community activity  PERSONAL FACTORS Age, Past/current experiences, Time since onset of injury/illness/exacerbation, and 3+ comorbidities: HTN, DM, cardiac, L knee hx  are also affecting patient's functional outcome.   REHAB POTENTIAL: Fair due to chronicity and comorbid conditions  CLINICAL DECISION MAKING:  Evolving/moderate complexity EVALUATION COMPLEXITY: Moderate   GOALS: Goals reviewed with patient? No  SHORT TERM GOALS: Target date: 03/10/2022  Pt will demonstrate appropriate understanding and performance of initially prescribed HEP in order to facilitate improved independence with management of symptoms.  Baseline: HEP provided on eval Goal status: INITIAL   2. Pt will score greater than or equal to 40 on FOTO in order to demonstrate improved perception of function due to symptoms.  Baseline: 33  Goal status: INITIAL   LONG TERM GOALS: Target date: 03/31/2022 Pt will score 48 on FOTO in order to demonstrate improved perception of functional status due to symptoms.  Baseline: 33 Goal status: INITIAL  2.  Pt will demonstrate 75% lumbar flexion AROM in order to demonstrate improved tolerance to functional movement patterns such as bending for lower body dressing.  Baseline: 25% with significant pain Goal status: INITIAL  3.  Pt will demonstrate modified hip abduction MMT of 4+/5 bilaterally in order to demonstrate improved strength for functional transfers. Baseline: 4-/5 L hip Goal status: INITIAL  4. Pt will perform at least 2 additional repetitions compared to baseline during 30sec in order to demonstrate reduced fall risk and improved functional independence.   Baseline: did not assess on eval due to symptom irritability, plan to assess as able/appropriate  Goal  status: INITIAL     PLAN: PT FREQUENCY: 1x/week  PT DURATION: 6 weeks  PLANNED INTERVENTIONS: Therapeutic exercises, Therapeutic activity, Neuromuscular re-education, Balance training, Gait training, Patient/Family education, Self Care, Joint mobilization, DME instructions, Aquatic Therapy, Dry Needling, Cryotherapy, Moist heat, Manual therapy, and Re-evaluation.  PLAN FOR NEXT SESSION: Progress ROM/strengthening exercises as able/appropriate, review HEP.    Leeroy Cha PT, DPT 02/17/2022 4:31 PM

## 2022-02-24 NOTE — Therapy (Signed)
OUTPATIENT PHYSICAL THERAPY TREATMENT NOTE   Patient Name: Tabitha Oneal MRN: [id number] DOB:1955/10/29, 66 y.o., female Today's Date: 02/25/2022  PCP: Thomes Dinning MD   REFERRING PROVIDER: Thomes Dinning MD  END OF SESSION:   PT End of Session - 02/25/22 1335     Visit Number 2    Number of Visits 7    Date for PT Re-Evaluation 03/31/22    Authorization Type [employer]    Progress Note Due on Visit 10    PT Start Time 1336   pt late arrival   PT Stop Time 1415    PT Time Calculation (min) 39 min    Equipment Utilized During Treatment Other (comment)   rolling walker   Activity Tolerance Patient limited by pain    Behavior During Therapy WFL for tasks assessed/performed             Past Medical History:  Diagnosis Date   Asthma    QVAR daily and Albuterol as needed   Bladder infection    taking Keflex daily    Chronic back pain    reason unknown   Diabetes mellitus    takes Metformin and Tradjenta daily   GERD (gastroesophageal reflux disease)    takes Omeprazole daily   Hard of hearing    Headache    daily.Takes Excedrine daily   Hepatitis C    History of blood transfusion    no abnormal reaction noted   Hyperlipidemia    takes Atorvastatin daily   Hypertension    takes Quinapril daily   Joint pain    Joint swelling    Nocturia    Obesity    Osteoarthritis of left knee 02/23/2012   Seasonal allergies    takes Claritin and Singulair daily.Nasal spray as needed   Shortness of breath dyspnea    with exertion   Past Surgical History:  Procedure Laterality Date   ABDOMINAL HYSTERECTOMY     EYE SURGERY  2021   INJECTION KNEE  04/09/2012   Procedure: KNEE INJECTION;  Surgeon: Johnny Bridge, MD;  Location: [facility];  Service: Orthopedics;  Laterality: Left;   JOINT REPLACEMENT     acl   bil knees   KNEE ARTHROTOMY Left 08/28/2015   Procedure: LEFT KNEE ARTHROFIBROSIS EXCISION; pylectomy;  Surgeon: Meredith Pel, MD;  Location: [facility];  Service: Orthopedics;  Laterality: Left;   KNEE CLOSED REDUCTION  04/09/2012   Procedure: CLOSED MANIPULATION KNEE;  Surgeon: Johnny Bridge, MD;  Location: [facility];  Service: Orthopedics;  Laterality: Left;  Manipulation Knee with Anesthesia includes Application of Traction    NERVE SURGERY Left 11/06/2020   Per patient on left knee   TOTAL KNEE ARTHROPLASTY  02/23/2012   Procedure: TOTAL KNEE ARTHROPLASTY;  Surgeon: Johnny Bridge, MD;  Location: [facility];  Service: Orthopedics;  Laterality: Left;   Patient Active Problem List   Diagnosis Date Noted   Gastroesophageal reflux disease 07/09/2021   Pain in left knee 09/13/2019   Aftercare following left knee joint replacement surgery 07/19/2019   Abnormal EKG 03/02/2019   Incomplete left bundle branch block (LBBB) 03/02/2019   Rebound headache 12/13/2018   Overuse of medication 12/13/2018   Intractable chronic migraine without aura and without status migrainosus 12/13/2018   Microcytic anemia 02/24/2018   High risk medication use 08/06/2017   Seasonal and perennial allergic rhinitis 07/30/2017   Recurrent infections 07/30/2017   Cough 06/08/2017   Moderate persistent  asthma without complication 29/79/8921   Chronic constipation 02/04/2017   Hyperlipidemia due to type 2 diabetes mellitus ([place]) 02/04/2017   Hallux rigidus, left foot 01/01/2017   Postmenopausal symptoms 02/22/2016   Arthrofibrosis of knee joint 08/28/2015   Left hip pain 10/08/2014   Type 2 diabetes mellitus without complication, without long-term current use of insulin ([place]) 10/08/2014   Hyperlipidemia LDL goal <100 10/08/2014   Essential hypertension, benign 08/30/2014   Diabetes mellitus, type 2 ([place]) 08/30/2014   Diverticulosis of colon without hemorrhage 08/30/2014   Chronic low back pain 08/30/2014   Chronic pain syndrome 08/30/2014   Asthma in adult without complication 19/41/7408   Contracture of left knee,  arthrofibrosis post op TKA 04/09/2012   Osteoarthritis of left knee 02/23/2012    REFERRING DIAG: Chronic low back pain   THERAPY DIAG:  Other low back pain  Muscle weakness (generalized)  Abnormal posture  Other abnormalities of gait and mobility  Rationale for Evaluation and Treatment Rehabilitation  PERTINENT HISTORY: Asthma, DM, GERD, HTN, cardiac  PRECAUTIONS: falls  SUBJECTIVE:  Pt arrives with 8/10 pain at present, reports mild soreness after last session and some difficulty with HEP. Notes she has been walking with walker and feels more steady.   PAIN:  Are you having pain: Yes, 8/10 Location: B low back, sometimes towards upper back  How would you describe your pain? Pressure, ache Best in past week: 7/10 Worst in past week: >10/10 Aggravating factors: lying on back, standing >15-44mn, bending down to pick things up, reaching overhead, prolonged sitting, difficulty with lower body Easing factors: ice pack, rest   OBJECTIVE: (objective measures completed at initial evaluation unless otherwise dated)   DIAGNOSTIC FINDINGS:  "MRI LUMBAR SPINE WITHOUT CONTRAST 01/01/2022 FINDINGS: Segmentation: Numbering is kept the same as on the prior study for consistency assuming hypoplastic ribs at T12. by this convention, there is a partially sacralized L5.   Alignment: Trace retrolisthesis at L2-L3.   Vertebrae: Similar vertebral body heights with mild degenerative endplate irregularity. No marrow edema. No suspicious osseous lesion.   Conus medullaris and cauda equina: Conus extends to the L1 level. Conus and cauda equina appear normal.   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   L1-L2: No canal or foraminal stenosis.   L2-L3: Disc bulge. Mild facet arthropathy. Mild canal stenosis. No foraminal stenosis.   L3-L4: Disc bulge. Mild facet arthropathy with ligamentum flavum infolding. Mild  canal stenosis. Minor foraminal stenosis.   L4-L5: Disc bulge with small central annular fissure. Moderate facet arthropathy with ligamentum flavum infolding. Mild canal stenosis. Mild right foraminal stenosis. No left foraminal stenosis.   L5-S1: Transitional level by numbering convention with hypoplastic disc. No stenosis.   IMPRESSION: Multilevel degenerative changes as detailed above are similar to the prior study. No high-grade stenosis."   PATIENT SURVEYS:  FOTO 33   SCREENING FOR RED FLAGS: Denies red flag symptoms   COGNITION:           Overall cognitive status: Within functional limits for tasks assessed                          SENSATION: WFL B LE       POSTURE: rounded shoulders, forward head, decreased lumbar lordosis, and flexed trunk    PALPATION: Deferred on this date due to time constraints   LUMBAR ROM:    Active  A/PROM  eval  Flexion 25% painful  Extension <10% painful  Right lateral flexion    Left lateral flexion    Right rotation    Left rotation     (Blank rows = not tested) Comments: Remainder NT due to symptom irritability     LOWER EXTREMITY MMT:     MMT Right eval Left eval  Hip flexion 4+ 3+  Hip abduction (modified sitting) 5 4-  Hip internal rotation      Hip external rotation      Knee flexion      Knee extension       (Blank rows = not tested)   Comments:mild pain with L abduction, "pressure" in low back with hip flexion B but denies overt pain   LUMBAR SPECIAL TESTS:  ? + slump on LLE, negative on R   FUNCTIONAL TESTS:  STS: from standard chair, B UE support required. Truncal rotation towards L although increased WB through RLE with LLE placed significantly anterior d/t reduced knee ROM   30sec STS: deferred on initial evaluation due to symptom irritability with exam, plan to assess as able/appropriate    GAIT: Distance walked: within clinic Assistive device utilized: Single point cane Level of assistance:  Modified independence Comments: Pt demos step to pattern leading with RLE, increased truncal rotation, fwd flexed posture, reduced L knee ROM throughout all phases of gait, and reduced gait speed. No overt instability on level ground within clinic but significant compensatory movements noted, intermittent tendency for steadying on nearby objects       TODAY'S TREATMENT  [facility] Adult PT Treatment:                                                DATE: 02/25/22 Therapeutic Exercise: Swiss ball fwd rollout 2x10, seated, heavy cues for appropriate ROM and improved comfort Seated thoracolumbar rotations, 2x10 B, cues for appropriate ROM Seated adductor iso w ball 2x10 Seated swiss ball press down fwd flexion iso 2x10   [facility] Adult PT Treatment:                                                DATE: 02/17/2022  Therapeutic Exercise: Seated TA activation x10 seated (attempted seated pelvic tilts, discontinued after 4 repetitions due to poor tolerance)         PATIENT EDUCATION:  Education details: reinforced RW, discussed HEP modifications for improved comfort/safety, rationale for interventions throughout, educated on need for clearance from MD after placement of nerve stimulator Person educated: Patient Education method: Explanation, Demonstration, Tactile cues, Verbal cues, and Handouts Education comprehension: verbalized understanding, returned demonstration, verbal cues required, tactile cues required, and needs further education      HOME EXERCISE PROGRAM: Access Code: [email or web address] URL: [email or web address] Date: 02/17/2022 Prepared by: Enis Slipper   Exercises - Seated Transversus Abdominis Bracing  - 1 x daily - 7 x weekly - 3 sets - 10 reps   ASSESSMENT:   CLINICAL IMPRESSION: Patient is a 66 y.o. woman who was seen today for physical therapy session for treatment of chronic low back pain. Pt arrives with pain comparable to initial evaluation, exercise tolerance initially  limited by symptom irritability but improves with repetition and cues for appropriate ROM as pt demos  tendency to try and push through pain. Pt reports improved symptoms as session progresses, reduced stiffness. Tolerates session well overall although she does require extended rest breaks and cues as above; does demonstrate mildly increased antalgic gait, states her knee feels stiff. At end of session pt mentions plans for nerve stimulator placement next week - education on need for MD clearance for return to PT afterwards, pt verbalizes understanding and states she will follow up after procedure.   Pt continues to demonstrate limitations in lumbar mobility and B LE strength. Recommend skilled PT to address aforementioned deficits to improve functional independence/tolerance. Pt departs today's session in no acute distress, all voiced questions/concerns addressed appropriately from PT perspective.          OBJECTIVE IMPAIRMENTS Abnormal gait, decreased activity tolerance, decreased balance, decreased endurance, decreased knowledge of use of DME, decreased mobility, difficulty walking, decreased ROM, decreased strength, and pain.    ACTIVITY LIMITATIONS carrying, lifting, bending, sitting, standing, squatting, sleeping, transfers, bathing, toileting, dressing, and reach over head   PARTICIPATION LIMITATIONS: meal prep, cleaning, laundry, driving, and community activity   PERSONAL FACTORS Age, Past/current experiences, Time since onset of injury/illness/exacerbation, and 3+ comorbidities: HTN, DM, cardiac, L knee hx  are also affecting patient's functional outcome.    REHAB POTENTIAL: Fair due to chronicity and comorbid conditions   CLINICAL DECISION MAKING:  Evolving/moderate complexity EVALUATION COMPLEXITY: Moderate     GOALS: Goals reviewed with patient? No   SHORT TERM GOALS: Target date: 03/10/2022   Pt will demonstrate appropriate understanding and performance of initially prescribed  HEP in order to facilitate improved independence with management of symptoms.  Baseline: HEP provided on eval Goal status: INITIAL    2. Pt will score greater than or equal to 40 on FOTO in order to demonstrate improved perception of function due to symptoms.            Baseline: 33            Goal status: INITIAL    LONG TERM GOALS: Target date: 03/31/2022 Pt will score 48 on FOTO in order to demonstrate improved perception of functional status due to symptoms.  Baseline: 33 Goal status: INITIAL   2.  Pt will demonstrate 75% lumbar flexion AROM in order to demonstrate improved tolerance to functional movement patterns such as bending for lower body dressing.  Baseline: 25% with significant pain Goal status: INITIAL   3.  Pt will demonstrate modified hip abduction MMT of 4+/5 bilaterally in order to demonstrate improved strength for functional transfers. Baseline: 4-/5 L hip Goal status: INITIAL   4. Pt will perform at least 2 additional repetitions compared to baseline during 30sec in order to demonstrate reduced fall risk and improved functional independence.             Baseline: did not assess on eval due to symptom irritability, plan to assess as able/appropriate            Goal status: INITIAL      PLAN: PT FREQUENCY: 1x/week   PT DURATION: 6 weeks   PLANNED INTERVENTIONS: Therapeutic exercises, Therapeutic activity, Neuromuscular re-education, Balance training, Gait training, Patient/Family education, Self Care, Joint mobilization, DME instructions, Aquatic Therapy, Dry Needling, Cryotherapy, Moist heat, Manual therapy, and Re-evaluation.   PLAN FOR NEXT SESSION: Progress ROM/strengthening exercises as able/appropriate, review HEP.    Leeroy Cha PT, DPT 02/25/2022 3:32 PM

## 2022-02-25 ENCOUNTER — Ambulatory Visit: Payer: Medicare Other | Attending: Infectious Diseases | Admitting: Physical Therapy

## 2022-02-25 ENCOUNTER — Encounter: Payer: Self-pay | Admitting: Physical Therapy

## 2022-02-25 ENCOUNTER — Other Ambulatory Visit: Payer: Self-pay

## 2022-02-25 DIAGNOSIS — G8929 Other chronic pain: Secondary | ICD-10-CM | POA: Diagnosis present

## 2022-02-25 DIAGNOSIS — M6281 Muscle weakness (generalized): Secondary | ICD-10-CM | POA: Diagnosis present

## 2022-02-25 DIAGNOSIS — M25562 Pain in left knee: Secondary | ICD-10-CM | POA: Diagnosis present

## 2022-02-25 DIAGNOSIS — R293 Abnormal posture: Secondary | ICD-10-CM | POA: Insufficient documentation

## 2022-02-25 DIAGNOSIS — R2689 Other abnormalities of gait and mobility: Secondary | ICD-10-CM | POA: Diagnosis present

## 2022-02-25 DIAGNOSIS — M5459 Other low back pain: Secondary | ICD-10-CM | POA: Diagnosis not present

## 2022-02-25 DIAGNOSIS — M25662 Stiffness of left knee, not elsewhere classified: Secondary | ICD-10-CM | POA: Diagnosis present

## 2022-02-25 DIAGNOSIS — R262 Difficulty in walking, not elsewhere classified: Secondary | ICD-10-CM | POA: Insufficient documentation

## 2022-02-25 MED ORDER — TIZANIDINE HCL 4 MG PO TABS: 4.0000 mg | ORAL_TABLET | Freq: Four times a day (QID) | ORAL | 0 refills | Status: DC | PRN

## 2022-03-04 NOTE — Progress Notes (Signed)
PATIENT: Tabitha Oneal DOB: 05-10-1956  REASON FOR VISIT: follow up HISTORY FROM: patient  Chief Complaint  Patient presents with   Follow-up    RM 2, alone. Last seen 03/06/21. CPAP/OSA f/u. Ambulates with cane.     Tabitha Oneal: migraines Tabitha Oneal: sleep  HISTORY OF PRESENT ILLNESS:  03/06/22 ALL:  [name] returns for follow up for migraines and OSA on CPAP. She admits that she has not used CPAP consistently over the past few months. She has been under more stress. She denies concerns with machine or supplies. She does not benefit when using therapy. She reports headaches are worse since not using CPAP. She continues topiramate '100mg'$  daily and tizanidine as needed. She is having near daily headaches.     03/06/2021 ALL: Tabitha Oneal returns for follow up for migraines and OSA on CPAP. She continues topiramate '100mg'$  at bedtime. She is doing well. Rare headaches, usually if she skips meals. Tizanidine '4mg'$  as needed helps with abortive therapy. She is followed regularly by PCP.     08/24/2019 ALL:  [name] is a 66 y.o. female here today for follow up for migraines. She was started on topiramate '100mg'$  at bedtime in 11/2018. Tizanidine was given for PRN use. She feels that headaches have improved. She was having headaches daily but now feels they occur 2-3 times a week. Tizanidine helps. Headaches can be present in the morning or occur throughout the day. She does have a headache today but blames this on not eating breakfast. She does snore. She wakes herself up at night snoring. She lives alone but reports that when her husband was alive he would fuss at her for snoring. She wakes multiple times at night. She thought that it was normal and has not paid much attention to how she feels in the mornings. She does not usually feel excessively tired throughout the day. Her daughter has sleep apnea treated with CPAP therpay.    HISTORY: (copied from Dr Cathren Laine note on 12/13/2018)  HPI:   Tabitha Oneal is a 66 y.o. female here as requested by [name], NP for headaches.  She has a past medical history of migraines, hypertension, asthma, diabetes, hyperlipidemia, osteoarthritis, contracture of left knee, chronic low back pain, chronic pain syndrome, high risk medication use. She takes excedrin 2x a day. She also takes the "PM kond"  The headaches are more in the front behind both eyes. It is throbbing. It hurts. She has daily headaches. She only took the Topiramate for a few days and then stopped. She has nausea. She has to lay down, throbbing. Sleeping helps. Light sensitivity. The headaches get better with medicaine but it comes back when the medicatuon wears off. She denies vision changes, numbness, weakness, speech or gait new difficulties. It hurts to bend over, the headaches will wake her up. She does not know if she snores and can;t tell me a lot about her sleeping.  No other focal neurologic deficits, associated symptoms, inciting events or modifiable factors.   Migraine medications tried include: Topamax, baclofen, Excedrin, Tylenol, Zofran, Accupril (ACE inhibitors can be used as migraine prevention), Phenergan   Reviewed notes, labs and imaging from outside physicians, which showed:   I reviewed notes from Sherrie Mustache.  Patient has a history of migraines.  Patient reports she often wakes up in the morning with a headache.  She was tried on several migraine medications and it has not helped.  Baclofen has not helped either.  She takes  Excedrin Migraine often.  She denies neck tension.  Reports the head pain is in the front near her eyes.  She has blurry/cloudy vision even with her glasses.  Her last eye exam was at least a year ago or maybe more.  She has uncontrolled blood pressure in the office it was 168/97.  She does eat mostly canned foods at home.  She is on Topamax 25 mg twice daily.   REVIEW OF SYSTEMS: Out of a complete 14 system review of symptoms, the  patient complains only of the following symptoms, headaches, snoring, knee pain and all other reviewed systems are negative.  ALLERGIES: No Known Allergies  HOME MEDICATIONS: Outpatient Medications Prior to Visit  Medication Sig Dispense Refill   Albuterol Sulfate (PROAIR RESPICLICK) 209 (90 Base) MCG/ACT AEPB INHALE 4 PUFFS INTO THE LUNGS EVERY 4 HOURS AS NEEDED 1 each 2   amLODipine (NORVASC) 10 MG tablet Take 1 tablet (10 mg total) by mouth daily. 90 tablet 1   aspirin EC 81 MG tablet Take 1 tablet (81 mg total) by mouth daily. Swallow whole. 90 tablet 3   azelastine (ASTELIN) 0.1 % nasal spray USE 1 SPRAY EACH NOSTRIL TWICE DAILY 30 mL 1   Calcium Carb-Cholecalciferol (CALCIUM 600 + D PO) Take by mouth.     diclofenac sodium (VOLTAREN) 1 % GEL Apply 8 g topically 2 (two) times daily as needed. On knees     diclofenac Sodium (VOLTAREN) 1 % GEL SMARTSIG:4 Gram(s) Topical Every 8 Hours PRN     ferrous sulfate 325 (65 FE) MG tablet TAKE 1 TABLET BY MOUTH EVERY DAY WITH BREAKFAST 90 tablet 1   fluticasone (FLONASE) 50 MCG/ACT nasal spray SPRAY 2 SPRAYS INTO EACH NOSTRIL EVERY DAY 48 mL 2   fluticasone furoate-vilanterol (BREO ELLIPTA) 200-25 MCG/ACT AEPB Inhale 1 puff into the lungs daily. 28 each 5   gabapentin (NEURONTIN) 100 MG capsule TAKE 1 CAPSULE (100 MG) BY ORAL ROUTE 3 TIMES PER DAY FOR 90 DAYS 270 capsule 3   ipratropium (ATROVENT) 0.03 % nasal spray PLACE 1 SPRAY INTO BOTH NOSTRILS EVERY 6 (SIX) HOURS AS NEEDED. 30 mL 5   JANUVIA 100 MG tablet TAKE 1 TABLET BY MOUTH EVERY DAY 90 tablet 1   levocetirizine (XYZAL) 5 MG tablet Take 1 tablet (5 mg total) by mouth every evening. 30 tablet 5   losartan (COZAAR) 50 MG tablet Take 50 mg by mouth daily.     metFORMIN (GLUCOPHAGE) 500 MG tablet Take 500 mg by mouth 2 (two) times daily.     montelukast (SINGULAIR) 10 MG tablet TAKE 1 TABLET BY MOUTH EVERYDAY AT BEDTIME 90 tablet 1   omeprazole (PRILOSEC) 40 MG capsule TAKE 1 CAPSULE BY MOUTH  EVERY DAY 90 capsule 1   ONE TOUCH ULTRA TEST test strip Check blood sugar once daily as directed  3   rosuvastatin (CRESTOR) 10 MG tablet TAKE 1 TABLET BY MOUTH EVERY DAY (Patient taking differently: Patient taking 5 mg) 90 tablet 1   rosuvastatin (CRESTOR) 20 MG tablet Take 20 mg by mouth daily.     famotidine (PEPCID) 20 MG tablet Take 1 tablet (20 mg total) by mouth 2 (two) times daily for 10 days. 20 tablet 0   quinapril (ACCUPRIL) 40 MG tablet Take 1 tablet (40 mg total) by mouth at bedtime. 90 tablet 1   tiZANidine (ZANAFLEX) 4 MG tablet Take 1 tablet (4 mg total) by mouth every 6 (six) hours as needed (as needed for migraines). [place]  tablet 0   topiramate (TOPAMAX) 100 MG tablet TAKE 1 TABLET BY MOUTH EVERYDAY AT BEDTIME 90 tablet 3   Facility-Administered Medications Prior to Visit  Medication Dose Route Frequency Provider Last Rate Last Admin   fluticasone furoate-vilanterol (BREO ELLIPTA) 200-25 MCG/ACT 1 puff  1 puff Inhalation Daily Valentina Shaggy, MD        PAST MEDICAL HISTORY: Past Medical History:  Diagnosis Date   Asthma    QVAR daily and Albuterol as needed   Bladder infection    taking Keflex daily    Chronic back pain    reason unknown   Diabetes mellitus    takes Metformin and Tradjenta daily   GERD (gastroesophageal reflux disease)    takes Omeprazole daily   Hard of hearing    Headache    daily.Takes Excedrine daily   Hepatitis C    History of blood transfusion    no abnormal reaction noted   Hyperlipidemia    takes Atorvastatin daily   Hypertension    takes Quinapril daily   Joint pain    Joint swelling    Nocturia    Obesity    Osteoarthritis of left knee 02/23/2012   Seasonal allergies    takes Claritin and Singulair daily.Nasal spray as needed   Shortness of breath dyspnea    with exertion    PAST SURGICAL HISTORY: Past Surgical History:  Procedure Laterality Date   ABDOMINAL HYSTERECTOMY     EYE SURGERY  2021   INJECTION KNEE   04/09/2012   Procedure: KNEE INJECTION;  Surgeon: Johnny Bridge, MD;  Location: [facility];  Service: Orthopedics;  Laterality: Left;   JOINT REPLACEMENT     acl   bil knees   KNEE ARTHROTOMY Left 08/28/2015   Procedure: LEFT KNEE ARTHROFIBROSIS EXCISION; pylectomy;  Surgeon: Meredith Pel, MD;  Location: [facility];  Service: Orthopedics;  Laterality: Left;   KNEE CLOSED REDUCTION  04/09/2012   Procedure: CLOSED MANIPULATION KNEE;  Surgeon: Johnny Bridge, MD;  Location: [facility];  Service: Orthopedics;  Laterality: Left;  Manipulation Knee with Anesthesia includes Application of Traction    NERVE SURGERY Left 11/06/2020   Per patient on left knee   TOTAL KNEE ARTHROPLASTY  02/23/2012   Procedure: TOTAL KNEE ARTHROPLASTY;  Surgeon: Johnny Bridge, MD;  Location: [facility];  Service: Orthopedics;  Laterality: Left;    FAMILY HISTORY: Family History  Problem Relation Age of Onset   Bronchitis Father 60   Alzheimer's disease Mother    Ovarian cancer Mother    Stomach cancer Mother    High blood pressure Sister    High blood pressure Sister    Headache Sister        pt thinks    High blood pressure Sister    High blood pressure Sister        pt thinks   Breast cancer Sister    High blood pressure Sister    High blood pressure Sister    Headache Daughter    Diabetes Daughter    Diabetes Daughter    Diabetes Other    Colon cancer Neg Hx    Esophageal cancer Neg Hx    Rectal cancer Neg Hx     SOCIAL HISTORY: Social History   Socioeconomic History   Marital status: Widowed    Spouse name: Not on file   Number of children: 4   Years of education: 14   Highest education level:  Some college, no degree  Occupational History   Not on file  Tobacco Use   Smoking status: Never   Smokeless tobacco: Never  Vaping Use   Vaping Use: Never used  Substance and Sexual Activity   Alcohol use: No    Alcohol/week: 0.0 standard drinks of alcohol    Drug use: No   Sexual activity: Yes    Birth control/protection: Surgical  Other Topics Concern   Not on file  Social History Narrative   Diet: none   Do you drink/eat things with caffeine ? Coffee    Material status: widow     What year were you married? 08/01/1978   Do you live in a house, apartment, assisted living,condo, trailer,ect.)? Townhouse (temporary)    Is it one or more stories? Yes   How many persons live in your home? Two   Do you have any pets in your home ? Yes, Shih-tzu   Current or past profession: none   Do you exercise? No  Type & how often: no   Do you have a living will ? No   Do you have a DNR form? No   If not, do you want to discuss one?  Not now   Do you have signed POA /HPOA forms? No    If so, please bring to your appointment.            Update 12/13/2018: pt states she doesn't drink much coffee, tea   She lives alone                     Social Determinants of Health   Financial Resource Strain: Medium Risk (02/11/2018)   Overall Financial Resource Strain (CARDIA)    Difficulty of Paying Living Expenses: Somewhat hard  Food Insecurity: Food Insecurity Present (02/11/2018)   Hunger Vital Sign    Worried About Running Out of Food in the Last Year: Sometimes true    Ran Out of Food in the Last Year: Sometimes true  Transportation Needs: No Transportation Needs (02/11/2018)   PRAPARE - Hydrologist (Medical): No    Lack of Transportation (Non-Medical): No  Physical Activity: Sufficiently Active (02/11/2018)   Exercise Vital Sign    Days of Exercise per Week: 7 days    Minutes of Exercise per Session: 30 min  Stress: Stress Concern Present (02/11/2018)   [place]    Feeling of Stress : To some extent  Social Connections: Somewhat Isolated (02/11/2018)   Social Connection and Isolation Panel [NHANES]    Frequency of Communication with Friends and Family:  Twice a week    Frequency of Social Gatherings with Friends and Family: Once a week    Attends Religious Services: More than 4 times per year    Active Member of [employer] or Organizations: No    Attends Archivist Meetings: Never    Marital Status: Widowed  Intimate Partner Violence: Not At Risk (02/11/2018)   Humiliation, Afraid, Rape, and Kick questionnaire    Fear of Current or Ex-Partner: No    Emotionally Abused: No    Physically Abused: No    Sexually Abused: No      PHYSICAL EXAM  Vitals:   03/06/22 1134  BP: 139/78  Pulse: 90  Weight: 203 lb 9.6 oz (92.4 kg)  Height: 5' 2.6" (1.59 m)     Body mass index is 36.53 kg/m.  Generalized: Well  developed, in no acute distress  Cardiology: normal rate and rhythm, no murmur noted Neurological examination  Mentation: Alert oriented to time, place, history taking. Follows all commands speech and language fluent Cranial nerve II-XII: Pupils were equal round reactive to light. Extraocular movements were full, visual field were full on confrontational test. Facial sensation and strength were normal. Uvula tongue midline. Head turning and shoulder shrug  were normal and symmetric. Motor: The motor testing reveals 5 over 5 strength of all 4 extremities. Good symmetric motor tone is noted throughout.  Gait and station: has left limp due to left knee brace, uses single prong cane for stability   DIAGNOSTIC DATA (LABS, IMAGING, TESTING) - I reviewed patient records, labs, notes, testing and imaging myself where available.     05/18/2019    9:56 AM  MMSE - Mini Mental State Exam  Orientation to time 5  Orientation to Place 5  Registration 3  Attention/ Calculation 5  Recall 3  Language- name 2 objects 2  Language- repeat 1  Language- follow 3 step command 3  Language- read & follow direction 1  Write a sentence 1  Copy design 1  Total score 30     Lab Results  Component Value Date   WBC 9.1 07/09/2021   HGB 12.3  07/09/2021   HCT 38.9 07/09/2021   MCV 80 07/09/2021   PLT 330 11/12/2020      Component Value Date/Time   NA 136 11/12/2020 1102   NA 145 (H) 12/13/2018 1431   K 4.4 11/12/2020 1102   CL 103 11/12/2020 1102   CO2 26 11/12/2020 1102   GLUCOSE 122 (H) 11/12/2020 1102   BUN 12 11/12/2020 1102   BUN 10 12/13/2018 1431   CREATININE 0.80 11/12/2020 1102   CALCIUM 10.3 11/12/2020 1102   PROT 8.0 11/12/2020 1102   PROT 7.5 11/21/2015 1026   ALBUMIN 3.8 02/08/2019 1752   ALBUMIN 4.4 11/21/2015 1026   AST 11 11/12/2020 1102   ALT 10 11/12/2020 1102   ALKPHOS 176 (H) 02/08/2019 1752   BILITOT 0.4 11/12/2020 1102   BILITOT 0.3 11/21/2015 1026   GFRNONAA 77 11/12/2020 1102   GFRAA 90 11/12/2020 1102   Lab Results  Component Value Date   CHOL 140 08/10/2020   HDL 60 08/10/2020   LDLCALC 65 08/10/2020   TRIG 66 08/10/2020   CHOLHDL 2.3 08/10/2020   Lab Results  Component Value Date   HGBA1C 6.7 (H) 11/12/2020   No results found for: "VITAMINB12" Lab Results  Component Value Date   TSH 0.517 12/13/2018       ASSESSMENT AND PLAN 66 y.o. year old female  has a past medical history of Asthma, Bladder infection, Chronic back pain, Diabetes mellitus, GERD (gastroesophageal reflux disease), Hard of hearing, Headache, Hepatitis C, History of blood transfusion, Hyperlipidemia, Hypertension, Joint pain, Joint swelling, Nocturia, Obesity, Osteoarthritis of left knee (02/23/2012), Seasonal allergies, and Shortness of breath dyspnea. here with     ICD-10-CM   1. OSA on CPAP  G47.33 For home use only DME continuous positive airway pressure (CPAP)    2. Chronic migraine without aura without status migrainosus, not intractable  G43.709       Mrs Copland is doing fairly well, today, but has had more headaches since not using CPAP. She will continue topiramate '100mg'$  daily and tizanidine '4mg'$  as needed. She will avoid missed meals and other triggers as identified.  Compliance report shows  very little usage. She will resume CPAP  nightly for at least 4 hours. Healthy lifestyle habits encouraged. She will follow up in 4 months. She verbalizes understanding and agreement with this plan.    Orders Placed This Encounter  Procedures   For home use only DME continuous positive airway pressure (CPAP)    Supplies    Order Specific Question:   Length of Need    Answer:   Lifetime    Order Specific Question:   Patient has OSA or probable OSA    Answer:   Yes    Order Specific Question:   Is the patient currently using CPAP in the home    Answer:   Yes    Order Specific Question:   Settings    Answer:   Other see comments    Order Specific Question:   CPAP supplies needed    Answer:   Mask, headgear, cushions, filters, heated tubing and water chamber      Meds ordered this encounter  Medications   topiramate (TOPAMAX) 100 MG tablet    Sig: TAKE 1 TABLET BY MOUTH EVERYDAY AT BEDTIME    Dispense:  90 tablet    Refill:  3    Order Specific Question:   Supervising Provider    Answer:   [name] [[id number]]   tiZANidine (ZANAFLEX) 4 MG tablet    Sig: Take 1 tablet (4 mg total) by mouth every 6 (six) hours as needed (as needed for migraines).    Dispense:  30 tablet    Refill:  0    Order Specific Question:   Supervising Provider    Answer:   Bess Harvest, FNP-C 03/06/2022, 12:06 PM [facility] [address] [place], [place] [address] [phone]

## 2022-03-04 NOTE — Patient Instructions (Signed)
Below is our plan:  We will continue topiramate and tizanidine. Resume CPAP asap.   Please continue using your CPAP regularly. While your insurance requires that you use CPAP at least 4 hours each night on 70% of the nights, I recommend, that you not skip any nights and use it throughout the night if you can. Getting used to CPAP and staying with the treatment long term does take time and patience and discipline. Untreated obstructive sleep apnea when it is moderate to severe can have an adverse impact on cardiovascular health and raise her risk for heart disease, arrhythmias, hypertension, congestive heart failure, stroke and diabetes. Untreated obstructive sleep apnea causes sleep disruption, nonrestorative sleep, and sleep deprivation. This can have an impact on your day to day functioning and cause daytime sleepiness and impairment of cognitive function, memory loss, mood disturbance, and problems focussing. Using CPAP regularly can improve these symptoms.  Please make sure you are staying well hydrated. I recommend 50-60 ounces daily. Well balanced diet and regular exercise encouraged. Consistent sleep schedule with 6-8 hours recommended.   Please continue follow up with care team as directed.   Follow up with me in 4 months   You may receive a survey regarding today's visit. I encourage you to leave honest feed back as I do use this information to improve patient care. Thank you for seeing me today!

## 2022-03-06 ENCOUNTER — Ambulatory Visit: Payer: Medicare Other | Admitting: Family Medicine

## 2022-03-06 ENCOUNTER — Encounter: Payer: Self-pay | Admitting: Family Medicine

## 2022-03-06 VITALS — BP 139/78 | HR 90 | Ht 62.6 in | Wt 203.6 lb

## 2022-03-06 DIAGNOSIS — G43709 Chronic migraine without aura, not intractable, without status migrainosus: Secondary | ICD-10-CM | POA: Diagnosis not present

## 2022-03-06 DIAGNOSIS — G4733 Obstructive sleep apnea (adult) (pediatric): Secondary | ICD-10-CM

## 2022-03-06 MED ORDER — TOPIRAMATE 100 MG PO TABS: ORAL_TABLET | ORAL | 3 refills | Status: DC

## 2022-03-06 MED ORDER — TIZANIDINE HCL 4 MG PO TABS: 4.0000 mg | ORAL_TABLET | Freq: Four times a day (QID) | ORAL | 0 refills | Status: DC | PRN

## 2022-03-12 ENCOUNTER — Ambulatory Visit: Payer: Medicare Other | Admitting: Physical Therapy

## 2022-03-12 ENCOUNTER — Encounter: Payer: Self-pay | Admitting: Physical Therapy

## 2022-03-12 DIAGNOSIS — R293 Abnormal posture: Secondary | ICD-10-CM

## 2022-03-12 DIAGNOSIS — M25662 Stiffness of left knee, not elsewhere classified: Secondary | ICD-10-CM

## 2022-03-12 DIAGNOSIS — R2689 Other abnormalities of gait and mobility: Secondary | ICD-10-CM

## 2022-03-12 DIAGNOSIS — M5459 Other low back pain: Secondary | ICD-10-CM

## 2022-03-12 DIAGNOSIS — R262 Difficulty in walking, not elsewhere classified: Secondary | ICD-10-CM

## 2022-03-12 DIAGNOSIS — M6281 Muscle weakness (generalized): Secondary | ICD-10-CM

## 2022-03-12 DIAGNOSIS — G8929 Other chronic pain: Secondary | ICD-10-CM

## 2022-03-12 NOTE — Therapy (Signed)
OUTPATIENT PHYSICAL THERAPY TREATMENT NOTE   Patient Name: Tabitha Oneal MRN: [id number] DOB:12/23/1955, 66 y.o., female Today's Date: 03/12/2022  PCP: Thomes Dinning MD   REFERRING PROVIDER: Thomes Dinning MD  END OF SESSION:   PT End of Session - 03/12/22 1418     Visit Number 3    Number of Visits 7    Date for PT Re-Evaluation 03/31/22    Authorization Type [employer]    Progress Note Due on Visit --    PT Start Time 1503    PT Stop Time 1545    PT Time Calculation (min) 42 min    Activity Tolerance Patient limited by pain    Behavior During Therapy WFL for tasks assessed/performed             Past Medical History:  Diagnosis Date   Asthma    QVAR daily and Albuterol as needed   Bladder infection    taking Keflex daily    Chronic back pain    reason unknown   Diabetes mellitus    takes Metformin and Tradjenta daily   GERD (gastroesophageal reflux disease)    takes Omeprazole daily   Hard of hearing    Headache    daily.Takes Excedrine daily   Hepatitis C    History of blood transfusion    no abnormal reaction noted   Hyperlipidemia    takes Atorvastatin daily   Hypertension    takes Quinapril daily   Joint pain    Joint swelling    Nocturia    Obesity    Osteoarthritis of left knee 02/23/2012   Seasonal allergies    takes Claritin and Singulair daily.Nasal spray as needed   Shortness of breath dyspnea    with exertion   Past Surgical History:  Procedure Laterality Date   ABDOMINAL HYSTERECTOMY     EYE SURGERY  2021   INJECTION KNEE  04/09/2012   Procedure: KNEE INJECTION;  Surgeon: Johnny Bridge, MD;  Location: [place];  Service: Orthopedics;  Laterality: Left;   JOINT REPLACEMENT     acl   bil knees   KNEE ARTHROTOMY Left 08/28/2015   Procedure: LEFT KNEE ARTHROFIBROSIS EXCISION; pylectomy;  Surgeon: Meredith Pel, MD;  Location: [place];  Service: Orthopedics;  Laterality: Left;   KNEE CLOSED  REDUCTION  04/09/2012   Procedure: CLOSED MANIPULATION KNEE;  Surgeon: Johnny Bridge, MD;  Location: [place];  Service: Orthopedics;  Laterality: Left;  Manipulation Knee with Anesthesia includes Application of Traction    NERVE SURGERY Left 11/06/2020   Per patient on left knee   TOTAL KNEE ARTHROPLASTY  02/23/2012   Procedure: TOTAL KNEE ARTHROPLASTY;  Surgeon: Johnny Bridge, MD;  Location: [place];  Service: Orthopedics;  Laterality: Left;   Patient Active Problem List   Diagnosis Date Noted   Gastroesophageal reflux disease 07/09/2021   Pain in left knee 09/13/2019   Aftercare following left knee joint replacement surgery 07/19/2019   Abnormal EKG 03/02/2019   Incomplete left bundle branch block (LBBB) 03/02/2019   Rebound headache 12/13/2018   Overuse of medication 12/13/2018   Intractable chronic migraine without aura and without status migrainosus 12/13/2018   Microcytic anemia 02/24/2018   High risk medication use 08/06/2017   Seasonal and perennial allergic rhinitis 07/30/2017   Recurrent infections 07/30/2017   Cough 06/08/2017   Moderate persistent asthma without complication 11/94/1740   Chronic constipation 02/04/2017   Hyperlipidemia due to type 2  diabetes mellitus ([place]) 02/04/2017   Hallux rigidus, left foot 01/01/2017   Postmenopausal symptoms 02/22/2016   Arthrofibrosis of knee joint 08/28/2015   Left hip pain 10/08/2014   Type 2 diabetes mellitus without complication, without long-term current use of insulin ([place]) 10/08/2014   Hyperlipidemia LDL goal <100 10/08/2014   Essential hypertension, benign 08/30/2014   Diabetes mellitus, type 2 ([place]) 08/30/2014   Diverticulosis of colon without hemorrhage 08/30/2014   Chronic low back pain 08/30/2014   Chronic pain syndrome 08/30/2014   Asthma in adult without complication 32/44/0102   Contracture of left knee, arthrofibrosis post op TKA 04/09/2012   Osteoarthritis of left knee 02/23/2012     REFERRING DIAG: Chronic low back pain   THERAPY DIAG:  Other low back pain  Muscle weakness (generalized)  Abnormal posture  Other abnormalities of gait and mobility  Stiffness of left knee, not elsewhere classified  Chronic pain of left knee  Difficulty in walking, not elsewhere classified  Rationale for Evaluation and Treatment Rehabilitation  PERTINENT HISTORY: Asthma, DM, GERD, HTN, cardiac  PRECAUTIONS: falls  SUBJECTIVE:  Pt had a nerve stimulator place early in the month.  She is cleared for PT by Dr.Johnson at primary care office. Marland Kitchen BAck is ok.  It really only hurts when I am doing things.   PAIN:  Are you having pain: Yes, 8/10 Location: B low back, sometimes towards upper back  How would you describe your pain? Pressure, ache Best in past week: 7/10 Worst in past week: >10/10 Aggravating factors: lying on back, standing >15-68mn, bending down to pick things up, reaching overhead, prolonged sitting, difficulty with lower body Easing factors: ice pack, rest   OBJECTIVE: (objective measures completed at initial evaluation unless otherwise dated)   DIAGNOSTIC FINDINGS:   PRoyal- Left femoral nerve at adductor canal and superolateral genicular nerves    "MRI LUMBAR SPINE WITHOUT CONTRAST 01/01/2022 FINDINGS: Segmentation: Numbering is kept the same as on the prior study for consistency assuming hypoplastic ribs at T12. by this convention, there is a partially sacralized L5.   Alignment: Trace retrolisthesis at L2-L3.   Vertebrae: Similar vertebral body heights with mild degenerative endplate irregularity. No marrow edema. No suspicious osseous lesion.   Conus medullaris and cauda equina: Conus extends to the L1 level. Conus and cauda equina appear normal.   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   L1-L2: No canal or foraminal stenosis.    L2-L3: Disc bulge. Mild facet arthropathy. Mild canal stenosis. No foraminal stenosis.   L3-L4: Disc bulge. Mild facet arthropathy with ligamentum flavum infolding. Mild canal stenosis. Minor foraminal stenosis.   L4-L5: Disc bulge with small central annular fissure. Moderate facet arthropathy with ligamentum flavum infolding. Mild canal stenosis. Mild right foraminal stenosis. No left foraminal stenosis.   L5-S1: Transitional level by numbering convention with hypoplastic disc. No stenosis.   IMPRESSION: Multilevel degenerative changes as detailed above are similar to the prior study. No high-grade stenosis."       PATIENT SURVEYS:  FOTO 33   SCREENING FOR RED FLAGS: Denies red flag symptoms   COGNITION:           Overall cognitive status: Within functional limits for tasks assessed                          SENSATION: WFL B LE       POSTURE:  rounded shoulders, forward head, decreased lumbar lordosis, and flexed trunk    PALPATION: Deferred on this date due to time constraints   LUMBAR ROM:    Active  A/PROM  eval  Flexion 25% painful  Extension <10% painful  Right lateral flexion    Left lateral flexion    Right rotation    Left rotation     (Blank rows = not tested) Comments: Remainder NT due to symptom irritability    Lt. Knee AROM limited to about 20 deg flexion limiting functional mobility.    LOWER EXTREMITY MMT:     MMT Right eval Left eval  Hip flexion 4+ 3+  Hip abduction (modified sitting) 5 4-  Hip internal rotation      Hip external rotation      Knee flexion      Knee extension       (Blank rows = not tested)   Comments:mild pain with L abduction, "pressure" in low back with hip flexion B but denies overt pain   LUMBAR SPECIAL TESTS:  ? + slump on LLE, negative on R   FUNCTIONAL TESTS:  STS: from standard chair, B UE support required. Truncal rotation towards L although increased WB through RLE with LLE placed significantly  anterior d/t reduced knee ROM   30sec STS: deferred on initial evaluation due to symptom irritability with exam, plan to assess as able/appropriate    GAIT: Distance walked: within clinic Assistive device utilized: Single point cane Level of assistance: Modified independence Comments: Pt demos step to pattern leading with RLE, increased truncal rotation, fwd flexed posture, reduced L knee ROM throughout all phases of gait, and reduced gait speed. No overt instability on level ground within clinic but significant compensatory movements noted, intermittent tendency for steadying on nearby objects       TODAY'S TREATMENT   [facility] Adult PT Treatment:                                                DATE: 03/12/22 Therapeutic Exercise: Knee to chest Rt LE x 3  Hamstring stretch LLE x 3 , 30 sec with strap Supine abdominal set max cues x 15 Upper trunk rotation x 5 each , heavy cues  Sit to stand Standing L stretch x 2 , pain increased in back  High knee march  x 10 with bilateral UE support  Standing row and extension (shoulders) x 15 each green TB , cues for core   Self Care: Sit to stand mechanics Supine to sit (via rolling) vs sitting straight UP.   [facility] Adult PT Treatment:                                                DATE: 02/25/22 Therapeutic Exercise: Swiss ball fwd rollout 2x10, seated, heavy cues for appropriate ROM and improved comfort Seated thoracolumbar rotations, 2x10 B, cues for appropriate ROM Seated adductor iso w ball 2x10 Seated swiss ball press down fwd flexion iso 2x10   [facility] Adult PT Treatment:  DATE: 02/17/2022  Therapeutic Exercise: Seated TA activation x10 seated (attempted seated pelvic tilts, discontinued after 4 repetitions due to poor tolerance)         PATIENT EDUCATION:  Education details: reinforced RW, discussed HEP modifications for improved comfort/safety, rationale for interventions throughout,  educated on need for clearance from MD after placement of nerve stimulator Person educated: Patient Education method: Explanation, Demonstration, Tactile cues, Verbal cues, and Handouts Education comprehension: verbalized understanding, returned demonstration, verbal cues required, tactile cues required, and needs further education      HOME EXERCISE PROGRAM: Access Code: [email or web address] URL: [email or web address] Date: 02/17/2022 Prepared by: Enis Slipper  Access Code: [email or web address] URL: [email or web address] Date: 03/12/2022 Prepared by: Raeford Razor  Exercises - Seated Transversus Abdominis Bracing  - 1 x daily - 7 x weekly - 3 sets - 10 reps - Standing Transverse Abdominis Contraction  - 1 x daily - 7 x weekly - 2 sets - 10 reps - 5 hold - Supine Hamstring Stretch with Strap  - 1 x daily - 7 x weekly - 1 sets - 5 reps - 30 hold - Supine Single Knee to Chest Stretch  - 1 x daily - 7 x weekly - 1 sets - 3 reps - 30 hold - Sidelying Open Book Thoracic Lumbar Rotation and Extension  - 1 x daily - 7 x weekly - 2 sets - 10 reps - 10 hold - Standing Shoulder Row with Anchored Resistance  - 1 x daily - 7 x weekly - 2 sets - 10 reps - 5 hold - Single Arm Shoulder Extension with Anchored Resistance  - 1 x daily - 7 x weekly - 2 sets - 10 reps - 5 hold - Single Arm Shoulder Extension with Anchored Resistance (Mirrored)  - 1 x daily - 7 x weekly - 2 sets - 10 reps - 30 hold   ASSESSMENT:   CLINICAL IMPRESSION: Patient has been cleared for continued PT through call with Cherene Altes, PT and Dr. Wynetta Emery at [facility].   She is safe to proceed with PT for low back pain .  Patient with significant limitations in lumbar mobility and B LE strength. Modified session to accommodate limited L knee flexion. She needs reinforcement for core activation, to understand her condition and functional mobility related to transfers and sleep.       OBJECTIVE IMPAIRMENTS  Abnormal gait, decreased activity tolerance, decreased balance, decreased endurance, decreased knowledge of use of DME, decreased mobility, difficulty walking, decreased ROM, decreased strength, and pain.    ACTIVITY LIMITATIONS carrying, lifting, bending, sitting, standing, squatting, sleeping, transfers, bathing, toileting, dressing, and reach over head   PARTICIPATION LIMITATIONS: meal prep, cleaning, laundry, driving, and community activity   PERSONAL FACTORS Age, Past/current experiences, Time since onset of injury/illness/exacerbation, and 3+ comorbidities: HTN, DM, cardiac, L knee hx  are also affecting patient's functional outcome.    REHAB POTENTIAL: Fair due to chronicity and comorbid conditions   CLINICAL DECISION MAKING:  Evolving/moderate complexity EVALUATION COMPLEXITY: Moderate     GOALS: Goals reviewed with patient? No   SHORT TERM GOALS: Target date: 03/10/2022   Pt will demonstrate appropriate understanding and performance of initially prescribed HEP in order to facilitate improved independence with management of symptoms.  Baseline: HEP provided on eval Goal status: INITIAL    2. Pt will score greater than or equal to 40 on FOTO in order to demonstrate improved perception of function due to symptoms.  Baseline: 33            Goal status: INITIAL    LONG TERM GOALS: Target date: 03/31/2022 Pt will score 48 on FOTO in order to demonstrate improved perception of functional status due to symptoms.  Baseline: 33 Goal status: INITIAL   2.  Pt will demonstrate 75% lumbar flexion AROM in order to demonstrate improved tolerance to functional movement patterns such as bending for lower body dressing.  Baseline: 25% with significant pain Goal status: INITIAL   3.  Pt will demonstrate modified hip abduction MMT of 4+/5 bilaterally in order to demonstrate improved strength for functional transfers. Baseline: 4-/5 L hip Goal status: INITIAL   4. Pt will perform  at least 2 additional repetitions compared to baseline during 30sec in order to demonstrate reduced fall risk and improved functional independence.             Baseline: did not assess on eval due to symptom irritability, plan to assess as able/appropriate            Goal status: INITIAL      PLAN: PT FREQUENCY: 1x/week   PT DURATION: 6 weeks   PLANNED INTERVENTIONS: Therapeutic exercises, Therapeutic activity, Neuromuscular re-education, Balance training, Gait training, Patient/Family education, Self Care, Joint mobilization, DME instructions, Aquatic Therapy, Dry Needling, Cryotherapy, Moist heat, Manual therapy, and Re-evaluation.   PLAN FOR NEXT SESSION: Progress ROM/strengthening exercises as able/appropriate, review HEP given 03/12/22.    Raeford Razor, PT 03/12/22 4:00 PM Phone: [phone] Fax: [phone]

## 2022-03-13 NOTE — Progress Notes (Signed)
Cardiology Office Note:    Date:  03/14/2022   ID:  Tabitha Oneal, Tabitha Oneal May 06, 1956, MRN [id number]  PCP:  Thomes Dinning, [place] Providers Cardiologist:  Werner Lean, MD     Referring MD: Thomes Dinning, MD   CC; Establish new cardiologist  History of Present Illness:    Tabitha Oneal is a 66 y.o. female with a hx of HTN, DM, HLD, with a known iLBBB. Last saw Dr. Harrell Gave in 2021; asked for [facility] due to close f/u.  Patient notes that she is doing ok.  She is here for follow up. There are no interval hospital/ED visit.    No chest pain or pressure.  No SOB/DOE and no PND/Orthopnea.  No weight gain or leg swelling.  No palpitations or syncope .  Ambulatory blood pressure SBP 114/68. Blood pressure is controlled prior to physical therapy.  After PT and with knee and back pain BP is usually 140-150s.   Past Medical History:  Diagnosis Date   Asthma    QVAR daily and Albuterol as needed   Bladder infection    taking Keflex daily    Chronic back pain    reason unknown   Diabetes mellitus    takes Metformin and Tradjenta daily   GERD (gastroesophageal reflux disease)    takes Omeprazole daily   Hard of hearing    Headache    daily.Takes Excedrine daily   Hepatitis C    History of blood transfusion    no abnormal reaction noted   Hyperlipidemia    takes Atorvastatin daily   Hypertension    takes Quinapril daily   Joint pain    Joint swelling    Nocturia    Obesity    Osteoarthritis of left knee 02/23/2012   Seasonal allergies    takes Claritin and Singulair daily.Nasal spray as needed   Shortness of breath dyspnea    with exertion    Past Surgical History:  Procedure Laterality Date   ABDOMINAL HYSTERECTOMY     EYE SURGERY  2021   INJECTION KNEE  04/09/2012   Procedure: KNEE INJECTION;  Surgeon: Johnny Bridge, MD;  Location: [place];  Service: Orthopedics;  Laterality: Left;    JOINT REPLACEMENT     acl   bil knees   KNEE ARTHROTOMY Left 08/28/2015   Procedure: LEFT KNEE ARTHROFIBROSIS EXCISION; pylectomy;  Surgeon: Meredith Pel, MD;  Location: [place];  Service: Orthopedics;  Laterality: Left;   KNEE CLOSED REDUCTION  04/09/2012   Procedure: CLOSED MANIPULATION KNEE;  Surgeon: Johnny Bridge, MD;  Location: [place];  Service: Orthopedics;  Laterality: Left;  Manipulation Knee with Anesthesia includes Application of Traction    NERVE SURGERY Left 11/06/2020   Per patient on left knee   TOTAL KNEE ARTHROPLASTY  02/23/2012   Procedure: TOTAL KNEE ARTHROPLASTY;  Surgeon: Johnny Bridge, MD;  Location: [place];  Service: Orthopedics;  Laterality: Left;    Current Medications: Current Meds  Medication Sig   Albuterol Sulfate (PROAIR RESPICLICK) 381 (90 Base) MCG/ACT AEPB INHALE 4 PUFFS INTO THE LUNGS EVERY 4 HOURS AS NEEDED   amLODipine (NORVASC) 10 MG tablet Take 1 tablet (10 mg total) by mouth daily.   aspirin EC 81 MG tablet Take 1 tablet (81 mg total) by mouth daily. Swallow whole.   azelastine (ASTELIN) 0.1 % nasal spray USE 1 SPRAY EACH NOSTRIL TWICE DAILY   Calcium Carb-Cholecalciferol (  CALCIUM 600 + D PO) Take by mouth.   diclofenac Sodium (VOLTAREN) 1 % GEL SMARTSIG:4 Gram(s) Topical Every 8 Hours PRN   ferrous sulfate 325 (65 FE) MG tablet TAKE 1 TABLET BY MOUTH EVERY DAY WITH BREAKFAST   fluticasone (FLONASE) 50 MCG/ACT nasal spray SPRAY 2 SPRAYS INTO EACH NOSTRIL EVERY DAY   fluticasone furoate-vilanterol (BREO ELLIPTA) 200-25 MCG/ACT AEPB Inhale 1 puff into the lungs daily.   gabapentin (NEURONTIN) 100 MG capsule TAKE 1 CAPSULE (100 MG) BY ORAL ROUTE 3 TIMES PER DAY FOR 90 DAYS   hydrochlorothiazide (MICROZIDE) 12.5 MG capsule Take 1 capsule (12.5 mg total) by mouth daily.   ipratropium (ATROVENT) 0.03 % nasal spray PLACE 1 SPRAY INTO BOTH NOSTRILS EVERY 6 (SIX) HOURS AS NEEDED.   JANUVIA 100 MG tablet TAKE 1 TABLET BY MOUTH EVERY  DAY   levocetirizine (XYZAL) 5 MG tablet Take 1 tablet (5 mg total) by mouth every evening.   losartan (COZAAR) 50 MG tablet Take 50 mg by mouth daily.   metFORMIN (GLUCOPHAGE) 500 MG tablet Take 500 mg by mouth 2 (two) times daily.   montelukast (SINGULAIR) 10 MG tablet TAKE 1 TABLET BY MOUTH EVERYDAY AT BEDTIME   omeprazole (PRILOSEC) 40 MG capsule TAKE 1 CAPSULE BY MOUTH EVERY DAY   ONE TOUCH ULTRA TEST test strip Check blood sugar once daily as directed   rosuvastatin (CRESTOR) 20 MG tablet Take 20 mg by mouth daily.   tiZANidine (ZANAFLEX) 4 MG tablet Take 1 tablet (4 mg total) by mouth every 6 (six) hours as needed (as needed for migraines).   topiramate (TOPAMAX) 100 MG tablet TAKE 1 TABLET BY MOUTH EVERYDAY AT BEDTIME   Current Facility-Administered Medications for the 03/14/22 encounter (Office Visit) with Werner Lean, MD  Medication   fluticasone furoate-vilanterol (BREO ELLIPTA) 200-25 MCG/ACT 1 puff     Allergies:   Patient has no known allergies.   Social History   Socioeconomic History   Marital status: Widowed    Spouse name: Not on file   Number of children: 4   Years of education: 14   Highest education level: Some college, no degree  Occupational History   Not on file  Tobacco Use   Smoking status: Never   Smokeless tobacco: Never  Vaping Use   Vaping Use: Never used  Substance and Sexual Activity   Alcohol use: No    Alcohol/week: 0.0 standard drinks of alcohol   Drug use: No   Sexual activity: Yes    Birth control/protection: Surgical  Other Topics Concern   Not on file  Social History Narrative   Diet: none   Do you drink/eat things with caffeine ? Coffee    Material status: widow     What year were you married? 08/01/1978   Do you live in a house, apartment, assisted living,condo, trailer,ect.)? Townhouse (temporary)    Is it one or more stories? Yes   How many persons live in your home? Two   Do you have any pets in your home ? Yes,  Shih-tzu   Current or past profession: none   Do you exercise? No  Type & how often: no   Do you have a living will ? No   Do you have a DNR form? No   If not, do you want to discuss one?  Not now   Do you have signed POA /HPOA forms? No    If so, please bring to your appointment.  Update 12/13/2018: pt states she doesn't drink much coffee, tea   She lives alone                     Social Determinants of Health   Financial Resource Strain: Medium Risk (02/11/2018)   Overall Financial Resource Strain (CARDIA)    Difficulty of Paying Living Expenses: Somewhat hard  Food Insecurity: Food Insecurity Present (02/11/2018)   Hunger Vital Sign    Worried About Running Out of Food in the Last Year: Sometimes true    Ran Out of Food in the Last Year: Sometimes true  Transportation Needs: No Transportation Needs (02/11/2018)   PRAPARE - Hydrologist (Medical): No    Lack of Transportation (Non-Medical): No  Physical Activity: Sufficiently Active (02/11/2018)   Exercise Vital Sign    Days of Exercise per Week: 7 days    Minutes of Exercise per Session: 30 min  Stress: Stress Concern Present (02/11/2018)   [place]    Feeling of Stress : To some extent  Social Connections: Somewhat Isolated (02/11/2018)   Social Connection and Isolation Panel [NHANES]    Frequency of Communication with Friends and Family: Twice a week    Frequency of Social Gatherings with Friends and Family: Once a week    Attends Religious Services: More than 4 times per year    Active Member of [employer] or Organizations: No    Attends Archivist Meetings: Never    Marital Status: Widowed     Family History: The patient's family history includes Alzheimer's disease in her mother; Breast cancer in her sister; Bronchitis (age of onset: 62) in her father; Diabetes in her daughter, daughter and another family  member; Headache in her daughter and sister; High blood pressure in her sister, sister, sister, sister, sister, and sister; Ovarian cancer in her mother; Stomach cancer in her mother. There is no history of Colon cancer, Esophageal cancer, or Rectal cancer.  ROS:   Please see the history of present illness.     All other systems reviewed and are negative.  EKGs/Labs/Other Studies Reviewed:    The following studies were reviewed today:   EKG:  EKG is  ordered today.  The ekg ordered today demonstrates  03/14/22: SR iLBBB QRS 96    ECHO COMPLETE WO IMAGING ENHANCING AGENT 07/03/2021  Narrative ECHOCARDIOGRAM REPORT    Patient Name:   TASHEA OTHMAN Date of Exam: 07/03/2021 Medical Rec #:  [id number]           Height:       62.0 in Accession #:    [id number]          Weight:       200.0 lb Date of Birth:  1956-02-09           BSA:          1.912 m Patient Age:    76 years            BP:           142/74 mmHg Patient Gender: F                   HR:           74 bpm. Exam Location:  [address]  Procedure: 2D Echo, 3D Echo, Cardiac Doppler, Color Doppler and Strain Analysis  Indications:    I10 Hypertension  History:  Patient has prior history of Echocardiogram examinations, most recent 12/20/2015. Signs/Symptoms:Shortness of Breath; Risk Factors:Hypertension, Diabetes, Dyslipidemia and Sleep Apnea. Obesity.  Sonographer:    Deliah Boston RDCS Referring Phys: [place]   1. Left ventricular ejection fraction, by estimation, is 60 to 65%. The left ventricle has normal function. The left ventricle has no regional wall motion abnormalities. Left ventricular diastolic parameters are indeterminate. The average left ventricular global longitudinal strain is 23.7 %. The global longitudinal strain is normal. 2. Right ventricular systolic function is normal. The right ventricular size is normal. There is normal pulmonary artery systolic pressure. The  estimated right ventricular systolic pressure is 38.7 mmHg. 3. Left atrial size was mildly dilated. 4. The mitral valve is normal in structure. Trivial mitral valve regurgitation. No evidence of mitral stenosis. 5. The aortic valve is tricuspid. Aortic valve regurgitation is not visualized. No aortic stenosis is present. 6. The inferior vena cava is normal in size with greater than 50% respiratory variability, suggesting right atrial pressure of 3 mmHg.  FINDINGS Left Ventricle: Left ventricular ejection fraction, by estimation, is 60 to 65%. The left ventricle has normal function. The left ventricle has no regional wall motion abnormalities. The average left ventricular global longitudinal strain is 23.7 %. The global longitudinal strain is normal. The left ventricular internal cavity size was normal in size. There is no left ventricular hypertrophy. Left ventricular diastolic parameters are indeterminate.  Right Ventricle: The right ventricular size is normal. No increase in right ventricular wall thickness. Right ventricular systolic function is normal. There is normal pulmonary artery systolic pressure. The tricuspid regurgitant velocity is 2.57 m/s, and with an assumed right atrial pressure of 3 mmHg, the estimated right ventricular systolic pressure is 56.4 mmHg.  Left Atrium: Left atrial size was mildly dilated.  Right Atrium: Right atrial size was normal in size.  Pericardium: There is no evidence of pericardial effusion.  Mitral Valve: The mitral valve is normal in structure. Trivial mitral valve regurgitation. No evidence of mitral valve stenosis.  Tricuspid Valve: The tricuspid valve is normal in structure. Tricuspid valve regurgitation is trivial.  Aortic Valve: The aortic valve is tricuspid. Aortic valve regurgitation is not visualized. No aortic stenosis is present.  Pulmonic Valve: The pulmonic valve was normal in structure. Pulmonic valve regurgitation is not  visualized.  Aorta: The aortic root is normal in size and structure.  Venous: The inferior vena cava is normal in size with greater than 50% respiratory variability, suggesting right atrial pressure of 3 mmHg.  IAS/Shunts: No atrial level shunt detected by color flow Doppler.   LEFT VENTRICLE PLAX 2D LVIDd:         4.60 cm   Diastology LVIDs:         2.70 cm   LV e' medial:    11.70 cm/s LV PW:         1.10 cm   LV E/e' medial:  10.6 LV IVS:        0.90 cm   LV e' lateral:   9.48 cm/s LVOT diam:     1.95 cm   LV E/e' lateral: 13.0 LV SV:         65 LV SV Index:   34        2D Longitudinal Strain LVOT Area:     2.99 cm  2D Strain GLS (A2C):   25.1 % 2D Strain GLS (A3C):   23.2 % 2D Strain GLS (A4C):   22.9 % 2D Strain  GLS Avg:     23.7 %  3D Volume EF: 3D EF:        68 % LV EDV:       132 ml LV ESV:       42 ml LV SV:        90 ml  RIGHT VENTRICLE RV S prime:     18.70 cm/s TAPSE (M-mode): 2.9 cm  LEFT ATRIUM             Index        RIGHT ATRIUM           Index LA diam:        4.50 cm 2.35 cm/m   RA Area:     15.30 cm LA Vol (A2C):   66.9 ml 35.00 ml/m  RA Volume:   39.80 ml  20.82 ml/m LA Vol (A4C):   68.5 ml 35.83 ml/m LA Biplane Vol: 68.0 ml 35.57 ml/m AORTIC VALVE LVOT Vmax:   98.57 cm/s LVOT Vmean:  68.925 cm/s LVOT VTI:    0.217 m  AORTA Ao Root diam: 2.30 cm Ao Asc diam:  2.45 cm  MITRAL VALVE                TRICUSPID VALVE MV Area (PHT): cm          TR Peak grad:   26.4 mmHg MV Decel Time: 208 msec     TR Vmax:        257.00 cm/s MV E velocity: 123.50 cm/s MV A velocity: 114.00 cm/s  SHUNTS MV E/A ratio:  1.08         Systemic VTI:  0.22 m Systemic Diam: 1.95 cm  Dalton McleanMD Electronically signed by Franki Monte Signature Date/Time: 07/03/2021/6:10:42 PM     Recent Labs: 07/09/2021: Hemoglobin 12.3  Recent Lipid Panel    Component Value Date/Time   CHOL 140 08/10/2020 1045   CHOL 140 11/21/2015 1026   TRIG 66 08/10/2020 1045    HDL 60 08/10/2020 1045   HDL 55 11/21/2015 1026   CHOLHDL 2.3 08/10/2020 1045   VLDL 11 02/20/2016 1000   LDLCALC 65 08/10/2020 1045     Risk Assessment/Calculations:    HYPERTENSION CONTROL Vitals:   03/14/22 1620 03/14/22 1637  BP: (!) 148/68 (!) 144/60    The patient's blood pressure is elevated above target today.  In order to address the patient's elevated BP: A new medication was prescribed today.         Physical Exam:    VS:  BP (!) 144/60   Pulse 71   Ht '5\' 2"'$  (1.575 m)   Wt 205 lb 6.4 oz (93.2 kg)   SpO2 97%   BMI 37.57 kg/m     Wt Readings from Last 3 Encounters:  03/14/22 205 lb 6.4 oz (93.2 kg)  03/06/22 203 lb 9.6 oz (92.4 kg)  10/08/21 207 lb 9.6 oz (94.2 kg)    Gen: No distress, morbid obesity Neck: No JVD Cardiac: No Rubs or Gallops, no murmur, RRR +2 radial pulses Respiratory: Clear to auscultation bilaterally, normal effort, normal  respiratory rate GI: Soft, nontender, non-distended  MS: +1 r leg edema;  moves all extremities Integument: Skin feels warm Neuro:  At time of evaluation, alert and oriented to person/place/time/situation  Psych: Normal affect, patient feels ok  ASSESSMENT:    1. Primary hypertension   2. Bilateral lower extremity edema   3. Morbid obesity ([place])   4. Hyperlipidemia associated with type  2 diabetes mellitus ([place])    PLAN:    HTN HLD with DM Morbid Obesity - continue current meds and start AMB BP Monitoring - will start low dose diuretc - labs in 1-2 weeks - f/u BP then  iLBBB - monitor  APP in 6 months Me in one year      Medication Adjustments/Labs and Tests Ordered: Current medicines are reviewed at length with the patient today.  Concerns regarding medicines are outlined above.  Orders Placed This Encounter  Procedures   Basic metabolic panel   EKG [id number]   Meds ordered this encounter  Medications   hydrochlorothiazide (MICROZIDE) 12.5 MG capsule    Sig: Take 1 capsule (12.5 mg total)  by mouth daily.    Dispense:  90 capsule    Refill:  3    Patient Instructions  Medication Instructions:  Your physician has recommended you make the following change in your medication:  START: hydrochlorothiazide (HCTZ) 12.5 mg by mouth once daily  *If you need a refill on your cardiac medications before your next appointment, please call your pharmacy*   Lab Work: IN 2 WEEKS: BMP  If you have labs (blood work) drawn today and your tests are completely normal, you will receive your results only by: [place] (if you have MyChart) OR A paper copy in the mail If you have any lab test that is abnormal or we need to change your treatment, we will call you to review the results.   Testing/Procedures: NONE   Follow-Up: At [facility], you and your health needs are our priority.  As part of our continuing mission to provide you with exceptional heart care, we have created designated Provider Care Teams.  These Care Teams include your primary Cardiologist (physician) and Advanced Practice Providers (APPs -  Physician Assistants and Nurse Practitioners) who all work together to provide you with the care you need, when you need it.     Your next appointment:   6 month(s)  The format for your next appointment:   In Person  Provider:   [name] Copa, PA-C, Ambrose Pancoast, NP, or Ermalinda Barrios, PA-C     Then, Werner Lean, MD will plan to see you again in 1 year(s).     Important Information About Sugar         Signed, Werner Lean, MD  03/14/2022 4:54 PM    [place]

## 2022-03-14 ENCOUNTER — Encounter: Payer: Self-pay | Admitting: Internal Medicine

## 2022-03-14 ENCOUNTER — Ambulatory Visit: Payer: Medicare Other | Attending: Internal Medicine | Admitting: Internal Medicine

## 2022-03-14 VITALS — BP 144/60 | HR 71 | Ht 62.0 in | Wt 205.4 lb

## 2022-03-14 DIAGNOSIS — E785 Hyperlipidemia, unspecified: Secondary | ICD-10-CM

## 2022-03-14 DIAGNOSIS — R600 Localized edema: Secondary | ICD-10-CM

## 2022-03-14 DIAGNOSIS — I10 Essential (primary) hypertension: Secondary | ICD-10-CM | POA: Diagnosis not present

## 2022-03-14 DIAGNOSIS — E1169 Type 2 diabetes mellitus with other specified complication: Secondary | ICD-10-CM | POA: Diagnosis not present

## 2022-03-14 MED ORDER — HYDROCHLOROTHIAZIDE 12.5 MG PO CAPS: 12.5000 mg | ORAL_CAPSULE | Freq: Every day | ORAL | 3 refills | Status: DC

## 2022-03-14 NOTE — Therapy (Addendum)
OUTPATIENT PHYSICAL THERAPY TREATMENT NOTE + ADDENDUM FOR DISCHARGE SUMMARY  PHYSICAL THERAPY DISCHARGE SUMMARY  Visits from Start of Care: 4  Current functional level related to goals / functional outcomes: Unable to assess   Remaining deficits: Unable to assess   Education / Equipment: Unable to assess   Patient agrees to discharge. Patient goals were not met. Patient is being discharged due to the patient's request.      Patient Name: Tabitha Oneal MRN: [id number] DOB:[birth year], 66 y.o., female Today's Date: 03/17/2022  PCP: Thomes Dinning MD   REFERRING PROVIDER: Thomes Dinning MD  END OF SESSION:   PT End of Session - 03/12/22 1418     Visit Number 3    Number of Visits 7    Date for PT Re-Evaluation 03/31/22    Authorization Type [employer] Medicare    Progress Note Due on Visit --    PT Start Time 1503    PT Stop Time 1545    PT Time Calculation (min) 42 min    Activity Tolerance Patient limited by pain    Behavior During Therapy WFL for tasks assessed/performed             Past Medical History:  Diagnosis Date   Asthma    QVAR daily and Albuterol as needed   Bladder infection    taking Keflex daily    Chronic back pain    reason unknown   Diabetes mellitus    takes Metformin and Tradjenta daily   GERD (gastroesophageal reflux disease)    takes Omeprazole daily   Hard of hearing    Headache    daily.Takes Excedrine daily   Hepatitis C    History of blood transfusion    no abnormal reaction noted   Hyperlipidemia    takes Atorvastatin daily   Hypertension    takes Quinapril daily   Joint pain    Joint swelling    Nocturia    Obesity    Osteoarthritis of left knee 02/23/2012   Seasonal allergies    takes Claritin and Singulair daily.Nasal spray as needed   Shortness of breath dyspnea    with exertion   Past Surgical History:  Procedure Laterality Date   ABDOMINAL HYSTERECTOMY     EYE SURGERY  2021   INJECTION KNEE   04/09/2012   Procedure: KNEE INJECTION;  Surgeon: Johnny Bridge, MD;  Location: [place];  Service: Orthopedics;  Laterality: Left;   JOINT REPLACEMENT     acl   bil knees   KNEE ARTHROTOMY Left 08/28/2015   Procedure: LEFT KNEE ARTHROFIBROSIS EXCISION; pylectomy;  Surgeon: Meredith Pel, MD;  Location: [place];  Service: Orthopedics;  Laterality: Left;   KNEE CLOSED REDUCTION  04/09/2012   Procedure: CLOSED MANIPULATION KNEE;  Surgeon: Johnny Bridge, MD;  Location: [place];  Service: Orthopedics;  Laterality: Left;  Manipulation Knee with Anesthesia includes Application of Traction    NERVE SURGERY Left 11/06/2020   Per patient on left knee   TOTAL KNEE ARTHROPLASTY  02/23/2012   Procedure: TOTAL KNEE ARTHROPLASTY;  Surgeon: Johnny Bridge, MD;  Location: [place];  Service: Orthopedics;  Laterality: Left;   Patient Active Problem List   Diagnosis Date Noted   Bilateral lower extremity edema 03/14/2022   Morbid obesity ([place]) 03/14/2022   Gastroesophageal reflux disease 07/09/2021   Pain in left knee 09/13/2019   Aftercare following left knee joint replacement surgery 07/19/2019   Abnormal EKG 03/02/2019  Incomplete left bundle branch block (LBBB) 03/02/2019   Rebound headache 12/13/2018   Overuse of medication 12/13/2018   Intractable chronic migraine without aura and without status migrainosus 12/13/2018   Microcytic anemia 02/24/2018   High risk medication use 08/06/2017   Seasonal and perennial allergic rhinitis 07/30/2017   Recurrent infections 07/30/2017   Cough 06/08/2017   Moderate persistent asthma without complication 04/54/0981   Chronic constipation 02/04/2017   Hyperlipidemia due to type 2 diabetes mellitus ([place]) 02/04/2017   Hallux rigidus, left foot 01/01/2017   Postmenopausal symptoms 02/22/2016   Arthrofibrosis of knee joint 08/28/2015   Left hip pain 10/08/2014   Type 2 diabetes mellitus without complication, without  long-term current use of insulin ([place]) 10/08/2014   Hyperlipidemia associated with type 2 diabetes mellitus ([place]) 10/08/2014   Primary hypertension 08/30/2014   Diabetes mellitus, type 2 ([place]) 08/30/2014   Diverticulosis of colon without hemorrhage 08/30/2014   Chronic low back pain 08/30/2014   Chronic pain syndrome 08/30/2014   Asthma in adult without complication 19/14/7829   Contracture of left knee, arthrofibrosis post op TKA 04/09/2012   Osteoarthritis of left knee 02/23/2012    REFERRING DIAG: Chronic low back pain   THERAPY DIAG:  Other low back pain  Muscle weakness (generalized)  Abnormal posture  Other abnormalities of gait and mobility  Stiffness of left knee, not elsewhere classified  Chronic pain of left knee  Difficulty in walking, not elsewhere classified  Rationale for Evaluation and Treatment Rehabilitation  PERTINENT HISTORY: Asthma, DM, GERD, HTN, cardiac  PRECAUTIONS: falls, L LE peripheral nerve stimulator (cleared for return to PT by Dr. Wynetta Emery at primary care office on 03/12/22)  SUBJECTIVE:  Pt arrives with report of increased soreness/pain after last session but states she is feeling back to baseline today. States she feels like her back pain has improved some with activities since starting PT but continues to have poor tolerance to supine, inquisitive about strategies to improve positioning.    PAIN:  Are you having pain: Yes, 6/10 Location: B low back, sometimes towards upper back  How would you describe your pain? Pressure, ache Best in past week: 6/10 Worst in past week: >10/10 Aggravating factors: lying on back, standing >15-9min, bending down to pick things up, reaching overhead, prolonged sitting, difficulty with lower body Easing factors: ice pack, rest   OBJECTIVE: (objective measures completed at initial evaluation unless otherwise dated)   DIAGNOSTIC FINDINGS:   [place] - Left femoral nerve at  adductor canal and superolateral genicular nerves    "MRI LUMBAR SPINE WITHOUT CONTRAST 01/01/2022 FINDINGS: Segmentation: Numbering is kept the same as on the prior study for consistency assuming hypoplastic ribs at T12. by this convention, there is a partially sacralized L5.   Alignment: Trace retrolisthesis at L2-L3.   Vertebrae: Similar vertebral body heights with mild degenerative endplate irregularity. No marrow edema. No suspicious osseous lesion.   Conus medullaris and cauda equina: Conus extends to the L1 level. Conus and cauda equina appear normal.   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   L1-L2: No canal or foraminal stenosis.   L2-L3: Disc bulge. Mild facet arthropathy. Mild canal stenosis. No foraminal stenosis.   L3-L4: Disc bulge. Mild facet arthropathy with ligamentum flavum infolding. Mild canal stenosis. Minor foraminal stenosis.   L4-L5: Disc bulge with small central annular fissure. Moderate facet arthropathy with ligamentum flavum infolding. Mild canal stenosis. Mild right foraminal stenosis.  No left foraminal stenosis.   L5-S1: Transitional level by numbering convention with hypoplastic disc. No stenosis.   IMPRESSION: Multilevel degenerative changes as detailed above are similar to the prior study. No high-grade stenosis."       PATIENT SURVEYS:  FOTO 33   SCREENING FOR RED FLAGS: Denies red flag symptoms   COGNITION:           Overall cognitive status: Within functional limits for tasks assessed                          SENSATION: WFL B LE       POSTURE: rounded shoulders, forward head, decreased lumbar lordosis, and flexed trunk    PALPATION: Deferred on this date due to time constraints   LUMBAR ROM:    Active  A/PROM  eval  Flexion 25% painful  Extension <10% painful  Right lateral flexion    Left lateral flexion    Right rotation    Left rotation      (Blank rows = not tested) Comments: Remainder NT due to symptom irritability    Lt. Knee AROM limited to about 20 deg flexion limiting functional mobility.    LOWER EXTREMITY MMT:     MMT Right eval Left eval  Hip flexion 4+ 3+  Hip abduction (modified sitting) 5 4-  Hip internal rotation      Hip external rotation      Knee flexion      Knee extension       (Blank rows = not tested)   Comments:mild pain with L abduction, "pressure" in low back with hip flexion B but denies overt pain   LUMBAR SPECIAL TESTS:  ? + slump on LLE, negative on R   FUNCTIONAL TESTS:  STS: from standard chair, B UE support required. Truncal rotation towards L although increased WB through RLE with LLE placed significantly anterior d/t reduced knee ROM   30sec STS: deferred on initial evaluation due to symptom irritability with exam, plan to assess as able/appropriate    GAIT: Distance walked: within clinic Assistive device utilized: Single point cane Level of assistance: Modified independence Comments: Pt demos step to pattern leading with RLE, increased truncal rotation, fwd flexed posture, reduced L knee ROM throughout all phases of gait, and reduced gait speed. No overt instability on level ground within clinic but significant compensatory movements noted, intermittent tendency for steadying on nearby objects       TODAY'S TREATMENT  [facility] Adult PT Treatment:                                                DATE: 03/17/22 Therapeutic Exercise: Adductor isos seated, 2x10, cues for appropriate ROM and breath control, symptom monitoring Standing GTB row 2x10, cues for appropriate form and reduced ROM Seated swiss ball press down x10, BLE supported on stool (modifications for positioning due to nerve stimulator) Standing marches 2x10 B LE, limited ROM on LLE, use of RW, cues for posture     Self care  - strategies to improve positioning/comfort with sleeping and resting activities at home   -  education on pacing of activities and activity modification, mechanics with functional movements for improved tolerance   - education on appropriate AD use (RW most appropriate at this point)  [facility] Adult PT Treatment:  DATE: 03/12/22 Therapeutic Exercise: Knee to chest Rt LE x 3  Hamstring stretch LLE x 3 , 30 sec with strap Supine abdominal set max cues x 15 Upper trunk rotation x 5 each , heavy cues  Sit to stand Standing L stretch x 2 , pain increased in back  High knee march  x 10 with bilateral UE support  Standing row and extension (shoulders) x 15 each green TB , cues for core   Self Care: Sit to stand mechanics Supine to sit (via rolling) vs sitting straight UP.   [facility] Adult PT Treatment:                                                DATE: 02/25/22 Therapeutic Exercise: Swiss ball fwd rollout 2x10, seated, heavy cues for appropriate ROM and improved comfort Seated thoracolumbar rotations, 2x10 B, cues for appropriate ROM Seated adductor iso w ball 2x10 Seated swiss ball press down fwd flexion iso 2x10       PATIENT EDUCATION:  Education details: rationale for interventions, self care strategies as described above, education on RW use Person educated: Patient Education method: Explanation, Demonstration, Tactile cues, Verbal cues, and Handouts Education comprehension: verbalized understanding, returned demonstration, verbal cues required, tactile cues required, and needs further education      HOME EXERCISE PROGRAM: Access Code: [email or web address] URL: [email or web address] Date: 02/17/2022 Prepared by: Enis Slipper  Access Code: [email or web address] URL: [email or web address] Date: 03/12/2022 Prepared by: Raeford Razor  Exercises - Seated Transversus Abdominis Bracing  - 1 x daily - 7 x weekly - 3 sets - 10 reps - Standing Transverse Abdominis Contraction  - 1 x daily - 7 x weekly - 2 sets - 10 reps - 5  hold - Supine Hamstring Stretch with Strap  - 1 x daily - 7 x weekly - 1 sets - 5 reps - 30 hold - Supine Single Knee to Chest Stretch  - 1 x daily - 7 x weekly - 1 sets - 3 reps - 30 hold - Sidelying Open Book Thoracic Lumbar Rotation and Extension  - 1 x daily - 7 x weekly - 2 sets - 10 reps - 10 hold - Standing Shoulder Row with Anchored Resistance  - 1 x daily - 7 x weekly - 2 sets - 10 reps - 5 hold - Single Arm Shoulder Extension with Anchored Resistance  - 1 x daily - 7 x weekly - 2 sets - 10 reps - 5 hold - Single Arm Shoulder Extension with Anchored Resistance (Mirrored)  - 1 x daily - 7 x weekly - 2 sets - 10 reps - 30 hold   ASSESSMENT:   CLINICAL IMPRESSION: Pt arrives with report of increased pain/soreness after last session, back to baseline today. Pt remains limited by high symptom irritability, LLE propped on stool with seated activity due to ROM deficits. Although symptoms remain irritable and pt requires frequent rest breaks, she reports gradual improvement in symptoms as session progresses, care taken to maintain appropriate setup/positioning with regard to nerve stimulator. Significant time also spent with education/discussion re: sleeping position, functional mechanics, and activity modification. Pt states her back is starting to feel good enough to use cane - advised to continue with RW given significance of gait impairments and fall risk. Pt departs with reports of improved symptoms compared to arrival, no adverse events. Pt departs today's  session in no acute distress, all voiced questions/concerns addressed appropriately from PT perspective.        OBJECTIVE IMPAIRMENTS Abnormal gait, decreased activity tolerance, decreased balance, decreased endurance, decreased knowledge of use of DME, decreased mobility, difficulty walking, decreased ROM, decreased strength, and pain.    ACTIVITY LIMITATIONS carrying, lifting, bending, sitting, standing, squatting, sleeping, transfers,  bathing, toileting, dressing, and reach over head   PARTICIPATION LIMITATIONS: meal prep, cleaning, laundry, driving, and community activity   PERSONAL FACTORS Age, Past/current experiences, Time since onset of injury/illness/exacerbation, and 3+ comorbidities: HTN, DM, cardiac, L knee hx  are also affecting patient's functional outcome.    REHAB POTENTIAL: Fair due to chronicity and comorbid conditions   CLINICAL DECISION MAKING:  Evolving/moderate complexity EVALUATION COMPLEXITY: Moderate     GOALS: Goals reviewed with patient? No   SHORT TERM GOALS: Target date: 03/10/2022   Pt will demonstrate appropriate understanding and performance of initially prescribed HEP in order to facilitate improved independence with management of symptoms.  Baseline: HEP provided on eval Goal status: INITIAL    2. Pt will score greater than or equal to 40 on FOTO in order to demonstrate improved perception of function due to symptoms.            Baseline: 33            Goal status: INITIAL    LONG TERM GOALS: Target date: 03/31/2022 Pt will score 48 on FOTO in order to demonstrate improved perception of functional status due to symptoms.  Baseline: 33 Goal status: INITIAL   2.  Pt will demonstrate 75% lumbar flexion AROM in order to demonstrate improved tolerance to functional movement patterns such as bending for lower body dressing.  Baseline: 25% with significant pain Goal status: INITIAL   3.  Pt will demonstrate modified hip abduction MMT of 4+/5 bilaterally in order to demonstrate improved strength for functional transfers. Baseline: 4-/5 L hip Goal status: INITIAL   4. Pt will perform at least 2 additional repetitions compared to baseline during 30sec in order to demonstrate reduced fall risk and improved functional independence.             Baseline: did not assess on eval due to symptom irritability, plan to assess as able/appropriate            Goal status: INITIAL      PLAN: PT  FREQUENCY: 1x/week   PT DURATION: 6 weeks   PLANNED INTERVENTIONS: Therapeutic exercises, Therapeutic activity, Neuromuscular re-education, Balance training, Gait training, Patient/Family education, Self Care, Joint mobilization, DME instructions, Aquatic Therapy, Dry Needling, Cryotherapy, Moist heat, Manual therapy, and Re-evaluation.   PLAN FOR NEXT SESSION:  continue to progress exercise as tolerated. Consider extension of POC next visit. Progress to increased time in standing as able/appropriate   Leeroy Cha PT, DPT 03/17/2022 12:42 PM   Addendum + discharge summary  Leeroy Cha PT, DPT 03/25/2022 3:00 PM

## 2022-03-14 NOTE — Patient Instructions (Signed)
Medication Instructions:  Your physician has recommended you make the following change in your medication:  START: hydrochlorothiazide (HCTZ) 12.5 mg by mouth once daily  *If you need a refill on your cardiac medications before your next appointment, please call your pharmacy*   Lab Work: IN 2 WEEKS: BMP  If you have labs (blood work) drawn today and your tests are completely normal, you will receive your results only by: [place] (if you have MyChart) OR A paper copy in the mail If you have any lab test that is abnormal or we need to change your treatment, we will call you to review the results.   Testing/Procedures: NONE   Follow-Up: At [facility], you and your health needs are our priority.  As part of our continuing mission to provide you with exceptional heart care, we have created designated Provider Care Teams.  These Care Teams include your primary Cardiologist (physician) and Advanced Practice Providers (APPs -  Physician Assistants and Nurse Practitioners) who all work together to provide you with the care you need, when you need it.     Your next appointment:   6 month(s)  The format for your next appointment:   In Person  Provider:   [name] Copa, PA-C, Ambrose Pancoast, NP, or Ermalinda Barrios, PA-C     Then, Werner Lean, MD will plan to see you again in 1 year(s).     Important Information About Sugar

## 2022-03-17 ENCOUNTER — Ambulatory Visit: Payer: Medicare Other | Admitting: Physical Therapy

## 2022-03-17 ENCOUNTER — Encounter: Payer: Self-pay | Admitting: Physical Therapy

## 2022-03-17 DIAGNOSIS — M5459 Other low back pain: Secondary | ICD-10-CM | POA: Diagnosis not present

## 2022-03-17 DIAGNOSIS — R262 Difficulty in walking, not elsewhere classified: Secondary | ICD-10-CM

## 2022-03-17 DIAGNOSIS — M25662 Stiffness of left knee, not elsewhere classified: Secondary | ICD-10-CM

## 2022-03-17 DIAGNOSIS — M6281 Muscle weakness (generalized): Secondary | ICD-10-CM

## 2022-03-17 DIAGNOSIS — R293 Abnormal posture: Secondary | ICD-10-CM

## 2022-03-17 DIAGNOSIS — G8929 Other chronic pain: Secondary | ICD-10-CM

## 2022-03-17 DIAGNOSIS — R2689 Other abnormalities of gait and mobility: Secondary | ICD-10-CM

## 2022-03-24 NOTE — Therapy (Incomplete)
OUTPATIENT PHYSICAL THERAPY TREATMENT NOTE   Patient Name: Tabitha Oneal MRN: [id number] DOB:1955-12-31, 66 y.o., female 65 Date: 03/24/2022  PCP: Thomes Dinning MD   REFERRING PROVIDER: Thomes Dinning MD  END OF SESSION:   PT End of Session - 03/12/22 1418     Visit Number 3    Number of Visits 7    Date for PT Re-Evaluation 03/31/22    Authorization Type [employer] Medicare    Progress Note Due on Visit --    PT Start Time 1503    PT Stop Time 1545    PT Time Calculation (min) 42 min    Activity Tolerance Patient limited by pain    Behavior During Therapy WFL for tasks assessed/performed             Past Medical History:  Diagnosis Date   Asthma    QVAR daily and Albuterol as needed   Bladder infection    taking Keflex daily    Chronic back pain    reason unknown   Diabetes mellitus    takes Metformin and Tradjenta daily   GERD (gastroesophageal reflux disease)    takes Omeprazole daily   Hard of hearing    Headache    daily.Takes Excedrine daily   Hepatitis C    History of blood transfusion    no abnormal reaction noted   Hyperlipidemia    takes Atorvastatin daily   Hypertension    takes Quinapril daily   Joint pain    Joint swelling    Nocturia    Obesity    Osteoarthritis of left knee 02/23/2012   Seasonal allergies    takes Claritin and Singulair daily.Nasal spray as needed   Shortness of breath dyspnea    with exertion   Past Surgical History:  Procedure Laterality Date   ABDOMINAL HYSTERECTOMY     EYE SURGERY  2021   INJECTION KNEE  04/09/2012   Procedure: KNEE INJECTION;  Surgeon: Johnny Bridge, MD;  Location: [facility];  Service: Orthopedics;  Laterality: Left;   JOINT REPLACEMENT     acl   bil knees   KNEE ARTHROTOMY Left 08/28/2015   Procedure: LEFT KNEE ARTHROFIBROSIS EXCISION; pylectomy;  Surgeon: Meredith Pel, MD;  Location: [facility];  Service: Orthopedics;  Laterality: Left;   KNEE CLOSED  REDUCTION  04/09/2012   Procedure: CLOSED MANIPULATION KNEE;  Surgeon: Johnny Bridge, MD;  Location: [facility];  Service: Orthopedics;  Laterality: Left;  Manipulation Knee with Anesthesia includes Application of Traction    NERVE SURGERY Left 11/06/2020   Per patient on left knee   TOTAL KNEE ARTHROPLASTY  02/23/2012   Procedure: TOTAL KNEE ARTHROPLASTY;  Surgeon: Johnny Bridge, MD;  Location: [facility];  Service: Orthopedics;  Laterality: Left;   Patient Active Problem List   Diagnosis Date Noted   Bilateral lower extremity edema 03/14/2022   Morbid obesity ([place]) 03/14/2022   Gastroesophageal reflux disease 07/09/2021   Pain in left knee 09/13/2019   Aftercare following left knee joint replacement surgery 07/19/2019   Abnormal EKG 03/02/2019   Incomplete left bundle branch block (LBBB) 03/02/2019   Rebound headache 12/13/2018   Overuse of medication 12/13/2018   Intractable chronic migraine without aura and without status migrainosus 12/13/2018   Microcytic anemia 02/24/2018   High risk medication use 08/06/2017   Seasonal and perennial allergic rhinitis 07/30/2017   Recurrent infections 07/30/2017   Cough 06/08/2017   Moderate persistent asthma without complication  06/08/2017   Chronic constipation 02/04/2017   Hyperlipidemia due to type 2 diabetes mellitus ([place]) 02/04/2017   Hallux rigidus, left foot 01/01/2017   Postmenopausal symptoms 02/22/2016   Arthrofibrosis of knee joint 08/28/2015   Left hip pain 10/08/2014   Type 2 diabetes mellitus without complication, without long-term current use of insulin ([place]) 10/08/2014   Hyperlipidemia associated with type 2 diabetes mellitus ([place]) 10/08/2014   Primary hypertension 08/30/2014   Diabetes mellitus, type 2 ([place]) 08/30/2014   Diverticulosis of colon without hemorrhage 08/30/2014   Chronic low back pain 08/30/2014   Chronic pain syndrome 08/30/2014   Asthma in adult without complication 93/71/6967   Contracture  of left knee, arthrofibrosis post op TKA 04/09/2012   Osteoarthritis of left knee 02/23/2012    REFERRING DIAG: Chronic low back pain   THERAPY DIAG:  No diagnosis found.  Rationale for Evaluation and Treatment Rehabilitation  PERTINENT HISTORY: Asthma, DM, GERD, HTN, cardiac  PRECAUTIONS: falls, L LE peripheral nerve stimulator (cleared for return to PT by Dr. Wynetta Emery at primary care office on 03/12/22)  SUBJECTIVE:  ***  *** Pt arrives with report of increased soreness/pain after last session but states she is feeling back to baseline today. States she feels like her back pain has improved some with activities since starting PT but continues to have poor tolerance to supine, inquisitive about strategies to improve positioning.    PAIN:  Are you having pain: Yes, 6/10 Location: B low back, sometimes towards upper back  How would you describe your pain? Pressure, ache Best in past week: 6/10 Worst in past week: >10/10 Aggravating factors: lying on back, standing >15-44mn, bending down to pick things up, reaching overhead, prolonged sitting, difficulty with lower body Easing factors: ice pack, rest   OBJECTIVE: (objective measures completed at initial evaluation unless otherwise dated)   DIAGNOSTIC FINDINGS:   [place]- Left femoral nerve at adductor canal and superolateral genicular nerves    "MRI LUMBAR SPINE WITHOUT CONTRAST 01/01/2022 FINDINGS: Segmentation: Numbering is kept the same as on the prior study for consistency assuming hypoplastic ribs at T12. by this convention, there is a partially sacralized L5.   Alignment: Trace retrolisthesis at L2-L3.   Vertebrae: Similar vertebral body heights with mild degenerative endplate irregularity. No marrow edema. No suspicious osseous lesion.   Conus medullaris and cauda equina: Conus extends to the L1 level. Conus and cauda equina appear normal.   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   L1-L2: No canal or foraminal stenosis.   L2-L3: Disc bulge. Mild facet arthropathy. Mild canal stenosis. No foraminal stenosis.   L3-L4: Disc bulge. Mild facet arthropathy with ligamentum flavum infolding. Mild canal stenosis. Minor foraminal stenosis.   L4-L5: Disc bulge with small central annular fissure. Moderate facet arthropathy with ligamentum flavum infolding. Mild canal stenosis. Mild right foraminal stenosis. No left foraminal stenosis.   L5-S1: Transitional level by numbering convention with hypoplastic disc. No stenosis.   IMPRESSION: Multilevel degenerative changes as detailed above are similar to the prior study. No high-grade stenosis."       PATIENT SURVEYS:  FOTO 33   SCREENING FOR RED FLAGS: Denies red flag symptoms   COGNITION:           Overall cognitive status: Within functional limits for tasks assessed  SENSATION: WFL B LE       POSTURE: rounded shoulders, forward head, decreased lumbar lordosis, and flexed trunk    PALPATION: Deferred on this date due to time constraints   LUMBAR ROM:    Active  A/PROM  eval  Flexion 25% painful  Extension <10% painful  Right lateral flexion    Left lateral flexion    Right rotation    Left rotation     (Blank rows = not tested) Comments: Remainder NT due to symptom irritability    Lt. Knee AROM limited to about 20 deg flexion limiting functional mobility.    LOWER EXTREMITY MMT:     MMT Right eval Left eval  Hip flexion 4+ 3+  Hip abduction (modified sitting) 5 4-  Hip internal rotation      Hip external rotation      Knee flexion      Knee extension       (Blank rows = not tested)   Comments:mild pain with L abduction, "pressure" in low back with hip flexion B but denies overt pain   LUMBAR SPECIAL TESTS:  ? + slump on LLE, negative on R   FUNCTIONAL TESTS:  STS: from standard  chair, B UE support required. Truncal rotation towards L although increased WB through RLE with LLE placed significantly anterior d/t reduced knee ROM   30sec STS: deferred on initial evaluation due to symptom irritability with exam, plan to assess as able/appropriate    GAIT: Distance walked: within clinic Assistive device utilized: Single point cane Level of assistance: Modified independence Comments: Pt demos step to pattern leading with RLE, increased truncal rotation, fwd flexed posture, reduced L knee ROM throughout all phases of gait, and reduced gait speed. No overt instability on level ground within clinic but significant compensatory movements noted, intermittent tendency for steadying on nearby objects       TODAY'S TREATMENT  [facility] Adult PT Treatment:                                                DATE: 03/25/22 Therapeutic Exercise: *** Manual Therapy: *** Neuromuscular re-ed: *** Therapeutic Activity: *** Modalities: *** Self Care: [facility] Adult PT Treatment:                                                DATE: 03/17/22 Therapeutic Exercise: Adductor isos seated, 2x10, cues for appropriate ROM and breath control, symptom monitoring Standing GTB row 2x10, cues for appropriate form and reduced ROM Seated swiss ball press down x10, BLE supported on stool (modifications for positioning due to nerve stimulator) Standing marches 2x10 B LE, limited ROM on LLE, use of RW, cues for posture     Self care  - strategies to improve positioning/comfort with sleeping and resting activities at home   - education on pacing of activities and activity modification, mechanics with functional movements for improved tolerance   - education on appropriate AD use (RW most appropriate at this point)  [facility] Adult PT Treatment:  DATE: 03/12/22 Therapeutic Exercise: Knee to chest Rt LE x 3  Hamstring stretch LLE x 3 , 30 sec with  strap Supine abdominal set max cues x 15 Upper trunk rotation x 5 each , heavy cues  Sit to stand Standing L stretch x 2 , pain increased in back  High knee march  x 10 with bilateral UE support  Standing row and extension (shoulders) x 15 each green TB , cues for core   Self Care: Sit to stand mechanics Supine to sit (via rolling) vs sitting straight UP.       PATIENT EDUCATION:  Education details: rationale for interventions, self care strategies as described above, education on RW use Person educated: Patient Education method: Explanation, Demonstration, Tactile cues, Verbal cues, and Handouts Education comprehension: verbalized understanding, returned demonstration, verbal cues required, tactile cues required, and needs further education      HOME EXERCISE PROGRAM: Access Code: [email or web address] URL: [email or web address] Date: 02/17/2022 Prepared by: Enis Slipper  Access Code: [email or web address] URL: [email or web address] Date: 03/12/2022 Prepared by: Raeford Razor  Exercises - Seated Transversus Abdominis Bracing  - 1 x daily - 7 x weekly - 3 sets - 10 reps - Standing Transverse Abdominis Contraction  - 1 x daily - 7 x weekly - 2 sets - 10 reps - 5 hold - Supine Hamstring Stretch with Strap  - 1 x daily - 7 x weekly - 1 sets - 5 reps - 30 hold - Supine Single Knee to Chest Stretch  - 1 x daily - 7 x weekly - 1 sets - 3 reps - 30 hold - Sidelying Open Book Thoracic Lumbar Rotation and Extension  - 1 x daily - 7 x weekly - 2 sets - 10 reps - 10 hold - Standing Shoulder Row with Anchored Resistance  - 1 x daily - 7 x weekly - 2 sets - 10 reps - 5 hold - Single Arm Shoulder Extension with Anchored Resistance  - 1 x daily - 7 x weekly - 2 sets - 10 reps - 5 hold - Single Arm Shoulder Extension with Anchored Resistance (Mirrored)  - 1 x daily - 7 x weekly - 2 sets - 10 reps - 30 hold   ASSESSMENT:   CLINICAL IMPRESSION: ***   *** Pt arrives with report of  increased pain/soreness after last session, back to baseline today. Pt remains limited by high symptom irritability, LLE propped on stool with seated activity due to ROM deficits. Although symptoms remain irritable and pt requires frequent rest breaks, she reports gradual improvement in symptoms as session progresses, care taken to maintain appropriate setup/positioning with regard to nerve stimulator. Significant time also spent with education/discussion re: sleeping position, functional mechanics, and activity modification. Pt states her back is starting to feel good enough to use cane - advised to continue with RW given significance of gait impairments and fall risk. Pt departs with reports of improved symptoms compared to arrival, no adverse events. Pt departs today's session in no acute distress, all voiced questions/concerns addressed appropriately from PT perspective.        OBJECTIVE IMPAIRMENTS Abnormal gait, decreased activity tolerance, decreased balance, decreased endurance, decreased knowledge of use of DME, decreased mobility, difficulty walking, decreased ROM, decreased strength, and pain.    ACTIVITY LIMITATIONS carrying, lifting, bending, sitting, standing, squatting, sleeping, transfers, bathing, toileting, dressing, and reach over head   PARTICIPATION LIMITATIONS: meal prep, cleaning, laundry, driving, and community activity   PERSONAL FACTORS Age, Past/current experiences, Time since onset of  injury/illness/exacerbation, and 3+ comorbidities: HTN, DM, cardiac, L knee hx  are also affecting patient's functional outcome.    REHAB POTENTIAL: Fair due to chronicity and comorbid conditions   CLINICAL DECISION MAKING:  Evolving/moderate complexity EVALUATION COMPLEXITY: Moderate     GOALS: Goals reviewed with patient? No   SHORT TERM GOALS: Target date: 03/10/2022   Pt will demonstrate appropriate understanding and performance of initially prescribed HEP in order to facilitate  improved independence with management of symptoms.  Baseline: HEP provided on eval Goal status: INITIAL    2. Pt will score greater than or equal to 40 on FOTO in order to demonstrate improved perception of function due to symptoms.            Baseline: 33            Goal status: INITIAL    LONG TERM GOALS: Target date: 03/31/2022 Pt will score 48 on FOTO in order to demonstrate improved perception of functional status due to symptoms.  Baseline: 33 Goal status: INITIAL   2.  Pt will demonstrate 75% lumbar flexion AROM in order to demonstrate improved tolerance to functional movement patterns such as bending for lower body dressing.  Baseline: 25% with significant pain Goal status: INITIAL   3.  Pt will demonstrate modified hip abduction MMT of 4+/5 bilaterally in order to demonstrate improved strength for functional transfers. Baseline: 4-/5 L hip Goal status: INITIAL   4. Pt will perform at least 2 additional repetitions compared to baseline during 30sec in order to demonstrate reduced fall risk and improved functional independence.             Baseline: did not assess on eval due to symptom irritability, plan to assess as able/appropriate            Goal status: INITIAL      PLAN: PT FREQUENCY: 1x/week   PT DURATION: 6 weeks   PLANNED INTERVENTIONS: Therapeutic exercises, Therapeutic activity, Neuromuscular re-education, Balance training, Gait training, Patient/Family education, Self Care, Joint mobilization, DME instructions, Aquatic Therapy, Dry Needling, Cryotherapy, Moist heat, Manual therapy, and Re-evaluation.   PLAN FOR NEXT SESSION:  *** continue to progress exercise as tolerated. Consider extension of POC next visit. Progress to increased time in standing as able/appropriate   Leeroy Cha PT, DPT 03/24/2022 3:45 PM

## 2022-03-25 ENCOUNTER — Ambulatory Visit: Payer: Medicare Other | Admitting: Physical Therapy

## 2022-03-28 ENCOUNTER — Ambulatory Visit: Payer: Medicare Other | Attending: Internal Medicine

## 2022-03-28 DIAGNOSIS — I10 Essential (primary) hypertension: Secondary | ICD-10-CM

## 2022-03-28 DIAGNOSIS — R600 Localized edema: Secondary | ICD-10-CM

## 2022-03-28 LAB — BASIC METABOLIC PANEL
BUN/Creatinine Ratio: 12 (ref 12–28)
BUN: 11 mg/dL (ref 8–27)
CO2: 24 mmol/L (ref 20–29)
Calcium: 10 mg/dL (ref 8.7–10.3)
Chloride: 101 mmol/L (ref 96–106)
Creatinine, Ser: 0.94 mg/dL (ref 0.57–1.00)
Glucose: 158 mg/dL — ABNORMAL HIGH (ref 70–99)
Potassium: 3.8 mmol/L (ref 3.5–5.2)
Sodium: 139 mmol/L (ref 134–144)
eGFR: 67 mL/min/{1.73_m2} (ref >59)

## 2022-03-31 ENCOUNTER — Telehealth: Payer: Self-pay | Admitting: Internal Medicine

## 2022-03-31 NOTE — Telephone Encounter (Signed)
See lab  results note ./cy

## 2022-03-31 NOTE — Telephone Encounter (Signed)
Patient is returning call to discuss lab results. 

## 2022-04-01 ENCOUNTER — Other Ambulatory Visit: Payer: Self-pay | Admitting: Allergy & Immunology

## 2022-04-10 ENCOUNTER — Ambulatory Visit (INDEPENDENT_AMBULATORY_CARE_PROVIDER_SITE_OTHER): Payer: Medicare Other | Admitting: Allergy & Immunology

## 2022-04-10 ENCOUNTER — Encounter: Payer: Self-pay | Admitting: Allergy & Immunology

## 2022-04-10 VITALS — HR 67 | Temp 97.5°F | Ht 61.0 in | Wt 201.8 lb

## 2022-04-10 DIAGNOSIS — K219 Gastro-esophageal reflux disease without esophagitis: Secondary | ICD-10-CM

## 2022-04-10 DIAGNOSIS — J3089 Other allergic rhinitis: Secondary | ICD-10-CM

## 2022-04-10 DIAGNOSIS — B999 Unspecified infectious disease: Secondary | ICD-10-CM | POA: Diagnosis not present

## 2022-04-10 DIAGNOSIS — J4540 Moderate persistent asthma, uncomplicated: Secondary | ICD-10-CM | POA: Diagnosis not present

## 2022-04-10 DIAGNOSIS — J302 Other seasonal allergic rhinitis: Secondary | ICD-10-CM

## 2022-04-10 NOTE — Patient Instructions (Addendum)
1. Moderate persistent asthma, uncomplicated - Spirometry looks GREAT today with levels above 100%!  - We are not going to make nay changes today.  - I will check to see if there is a patient assistance program for Breo.  - Daily controller medication(s): Breo 200/85mg one puff once daily - Prior to physical activity: albuterol 2 puffs 10-15 minutes before physical activity. - Rescue medications: albuterol 4 puffs every 4-6 hours as needed or albuterol nebulizer one vial every 4-6 hours as needed - Asthma control goals:  * Full participation in all desired activities (may need albuterol before activity) * Albuterol use two time or less a week on average (not counting use with activity) * Cough interfering with sleep two time or less a month * Oral steroids no more than once a year * No hospitalizations  2. Chronic rhinitis -  - Continue with nasal ipratropium one spray per nostril every six hours AS NEEDED. - Continue with fluticasone nasal spray two sprays per nostril daily (AIM FOR THE EARS). - Continue with Singulair '10mg'$  daily.   3. GERD  - Continue omeprazole '40mg'$  daily.  4. Return in about 6 months (around 10/09/2022).    Please inform [place] any Emergency Department visits, hospitalizations, or changes in symptoms. Call [place]efore going to the ED for breathing or allergy symptoms since we might be able to fit you in for a sick visit. Feel free to contact u[place]anytime with any questions, problems, or concerns.  It was a pleasure to see you again today!  Websites that have reliable patient information: 1. American Academy of Asthma, Allergy, and Immunology: www.aaaai.org 2. Food Allergy Research and Education (FARE): foodallergy.org 3. Mothers of Asthmatics: http://www.asthmacommunitynetwork.org 4. American College of Allergy, Asthma, and Immunology: www.acaai.org   COVID-19 Vaccine Information can be found at:  [email or web address] questions related to vaccine distribution or appointments, please email [email or web address] or call [phone]     "Like" [facility] Facebook and Instagram for our latest updates!       Make sure you are registered to vote! If you have moved or changed any of your contact information, you will need to get this updated before voting!  In some cases, you MAY be able to register to vote online: [email or web address]

## 2022-04-10 NOTE — Progress Notes (Signed)
FOLLOW UP  Date of Service/Encounter:  04/10/22   Assessment:   Moderate persistent asthma, uncomplicated    Seasonal and perennial allergic rhinitis (trees, mold, dust mite)   Gastroesophageal reflux disease - adding on a PPI   Anemia   Recurrent infections - most recent workup within normal limits  Plan/Recommendations:   1. Moderate persistent asthma, uncomplicated - Spirometry looks GREAT today with levels above 100%!  - We are not going to make nay changes today.  - I will check to see if there is a patient assistance program for Breo.  - Daily controller medication(s): Breo 200/60mg one puff once daily - Prior to physical activity: albuterol 2 puffs 10-15 minutes before physical activity. - Rescue medications: albuterol 4 puffs every 4-6 hours as needed or albuterol nebulizer one vial every 4-6 hours as needed - Asthma control goals:  * Full participation in all desired activities (may need albuterol before activity) * Albuterol use two time or less a week on average (not counting use with activity) * Cough interfering with sleep two time or less a month * Oral steroids no more than once a year * No hospitalizations  2. Chronic rhinitis -  - Continue with nasal ipratropium one spray per nostril every six hours AS NEEDED. - Continue with fluticasone nasal spray two sprays per nostril daily (AIM FOR THE EARS). - Continue with Singulair '10mg'$  daily.   3. GERD  - Continue omeprazole '40mg'$  daily.  4. Return in about 6 months (around 10/09/2022).    Subjective:   Tabitha MYISELL SPRUNGERis a 66y.o. female presenting today for follow up of  Chief Complaint  Patient presents with  . Asthma    6 mth f/u - Doing okay  . Seasonal and Perennial Allergic Rhinitis    6 mth f/ u - Doing Okay  . Gastroesophageal Reflux    6 mth f/u - Doing okay    Tabitha MTANICKA BISAILLONhas a history of the following: Patient Active Problem List   Diagnosis Date Noted  . Bilateral lower  extremity edema 03/14/2022  . Morbid obesity ([place] 03/14/2022  . Gastroesophageal reflux disease 07/09/2021  . Pain in left knee 09/13/2019  . 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 Abnormal EKG 03/02/2019  . Incomplete left bundle branch block (LBBB) 03/02/2019  . Rebound headache 12/13/2018  . Overuse of medication 12/13/2018  . Intractable chronic migraine without aura and without status migrainosus 12/13/2018  . Microcytic anemia 02/24/2018  . High risk medication use 08/06/2017  . Seasonal and perennial allergic rhinitis 07/30/2017  . Recurrent infections 07/30/2017  . Cough 06/08/2017  . Moderate persistent asthma without complication 016/02/9603 . Chronic constipation 02/04/2017  . Hyperlipidemia due to type 2 diabetes mellitus ([place] 02/04/2017  . Hallux rigidus, left foot 01/01/2017  . Postmenopausal symptoms 02/22/2016  . Arthrofibrosis of knee joint 08/28/2015  . Left hip pain 10/08/2014  . Type 2 diabetes mellitus without complication, without long-term current use of insulin ([place] 10/08/2014  . Hyperlipidemia associated with type 2 diabetes mellitus ([place] 10/08/2014  . Primary hypertension 08/30/2014  . Diabetes mellitus, type 2 ([place] 08/30/2014  . Diverticulosis of colon without hemorrhage 08/30/2014  . Chronic low back pain 08/30/2014  . Chronic pain syndrome 08/30/2014  . Asthma in adult without complication 054/01/8118 . 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 Osteoarthritis of left knee 02/23/2012    History obtained from: chart review and patient.  Tabitha Oneal is a 66 y.o. female presenting for a follow up visit.  She was last seen in May 2023.  At that time, we did not do a spirometry.  We continue with Breo 200 mcg 1 puff once daily as well as albuterol as needed.  For her chronic rhinitis, we continue with Atrovent as needed and Flonase.  We also continue with Singulair.  We started her on Xyzal as well as  prednisone and Bepreve eyedrops.  For her reflux, we continue with 40 mg daily.  Since last visit, she has done well. She skipped breakfast today and now has a headache. But she is in good spirits nonetheless.   Asthma/Respiratory Symptom History: She remains on her Breo one puff once daily. She is doing very well with this regimen and it has helped her to avoid systemic steroids. She has not been using her albuterol much at all.   Allergic Rhinitis Symptom History: She does have an intermittent runny nose. Otherwise she does fairly well with this. The nose sprays help a lot.   {Blank single:19197::"Food Allergy Symptom History: ***"," "}  {Blank single:19197::"Skin Symptom History: ***"," "}  {Blank single:19197::"GERD Symptom History: ***"," "}  She had surgery on her left knee recently. She has physical therapy routinely. She went to [facility] to do some shopping. She wanted to see how she did but the pain returned. She   Otherwise, there have been no changes to her past medical history, surgical history, family history, or social history.    ROS     Objective:   Pulse 67, temperature (!) 97.5 F (36.4 C), height '5\' 1"'$  (1.549 m), weight 201 lb 12.8 oz (91.5 kg), SpO2 95 %. Body mass index is 38.13 kg/m.    Physical Exam   Diagnostic studies:    Spirometry: results normal (FEV1: 1.79/101%, FVC: 2.39/106%, FEV1/FVC: 75%).    Spirometry consistent with normal pattern.   Allergy Studies: {Blank single:19197::"none","labs sent instead"," "}    {Blank single:19197::"Allergy testing results were read and interpreted by myself, documented by clinical staff."," "}      Salvatore Marvel, MD  Allergy and [place] [facility]

## 2022-04-30 ENCOUNTER — Other Ambulatory Visit: Payer: Self-pay | Admitting: Internal Medicine

## 2022-05-04 ENCOUNTER — Other Ambulatory Visit: Payer: Self-pay | Admitting: Allergy & Immunology

## 2022-06-07 ENCOUNTER — Telehealth: Payer: Self-pay | Admitting: Physical Therapy

## 2022-06-07 NOTE — Telephone Encounter (Signed)
Contacted patient to inform that her appt that was scheduled for 01/15 was scheduled in error. I informed her that due to her being discharged last October we would need a new referral from her provider in order for her to be seen. I provided her with our [place] number, she stated she would call them to get it sent to [place].

## 2022-06-09 ENCOUNTER — Ambulatory Visit: Payer: Medicare Other | Admitting: Physical Therapy

## 2022-06-27 ENCOUNTER — Other Ambulatory Visit: Payer: Self-pay | Admitting: Allergy & Immunology

## 2022-07-09 NOTE — Patient Instructions (Signed)
Below is our plan:  We will continue topiramate 170m daily and tizanidine as needed. We may consider stopping topiramate in the future if your headaches stay well managed.   Please continue using your CPAP regularly. While your insurance requires that you use CPAP at least 4 hours each night on 70% of the nights, I recommend, that you not skip any nights and use it throughout the night if you can. Getting used to CPAP and staying with the treatment long term does take time and patience and discipline. Untreated obstructive sleep apnea when it is moderate to severe can have an adverse impact on cardiovascular health and raise her risk for heart disease, arrhythmias, hypertension, congestive heart failure, stroke and diabetes. Untreated obstructive sleep apnea causes sleep disruption, nonrestorative sleep, and sleep deprivation. This can have an impact on your day to day functioning and cause daytime sleepiness and impairment of cognitive function, memory loss, mood disturbance, and problems focussing. Using CPAP regularly can improve these symptoms.  Please make sure you are staying well hydrated. I recommend 50-60 ounces daily. Well balanced diet and regular exercise encouraged. Consistent sleep schedule with 6-8 hours recommended.   Please continue follow up with care team as directed.   Follow up with me in 1 year   You may receive a survey regarding today's visit. I encourage you to leave honest feed back as I do use this information to improve patient care. Thank you for seeing me today!

## 2022-07-09 NOTE — Progress Notes (Signed)
PATIENT: Tabitha Oneal DOB: 1955/10/10  REASON FOR VISIT: follow up HISTORY FROM: patient  Chief Complaint  Patient presents with   Room 2    Pt is here Alone. Pt states that things have been going fine. Pt states no headaches with her CPAP.      Ahern: migraines Athar: sleep  HISTORY OF PRESENT ILLNESS:  07/10/22 ALL:  Tabitha Oneal returns for follow up for migraines and OSA on CPAP. She was last seen 02/2022 and having more headaches. We encouraged her to resume CPAP use. She continues topiramate 100mg  QD and tizanidine as needed. She has done very well. She is now using CPAP every night for about 8 hours. She reports headaches are nearly resolved. She does use tizanidine a couple times a month. She feels better on CPAP therapy.     03/06/2022 ALL: Tabitha Oneal returns for follow up for migraines and OSA on CPAP. She admits that she has not used CPAP consistently over the past few months. She has been under more stress. She denies concerns with machine or supplies. She does not benefit when using therapy. She reports headaches are worse since not using CPAP. She continues topiramate 100mg  daily and tizanidine as needed. She is having near daily headaches.     03/06/2021 ALL: Tabitha Oneal returns for follow up for migraines and OSA on CPAP. She continues topiramate 100mg  at bedtime. She is doing well. Rare headaches, usually if she skips meals. Tizanidine 4mg  as needed helps with abortive therapy. She is followed regularly by PCP.     08/24/2019 ALL:  Tabitha Oneal is a 67 y.o. female here today for follow up for migraines. She was started on topiramate 100mg  at bedtime in 11/2018. Tizanidine was given for PRN use. She feels that headaches have improved. She was having headaches daily but now feels they occur 2-3 times a week. Tizanidine helps. Headaches can be present in the morning or occur throughout the day. She does have a headache today but blames this on not eating  breakfast. She does snore. She wakes herself up at night snoring. She lives alone but reports that when her husband was alive he would fuss at her for snoring. She wakes multiple times at night. She thought that it was normal and has not paid much attention to how she feels in the mornings. She does not usually feel excessively tired throughout the day. Her daughter has sleep apnea treated with CPAP therpay.    HISTORY: (copied from Dr Trevor Mace note on 12/13/2018)  HPI:  Tabitha Oneal is a 67 y.o. female here as requested by Sharon Seller, NP for headaches.  She has a past medical history of migraines, hypertension, asthma, diabetes, hyperlipidemia, osteoarthritis, contracture of left knee, chronic low back pain, chronic pain syndrome, high risk medication use. She takes excedrin 2x a day. She also takes the "PM kond"  The headaches are more in the front behind both eyes. It is throbbing. It hurts. She has daily headaches. She only took the Topiramate for a few days and then stopped. She has nausea. She has to lay down, throbbing. Sleeping helps. Light sensitivity. The headaches get better with medicaine but it comes back when the medicatuon wears off. She denies vision changes, numbness, weakness, speech or gait new difficulties. It hurts to bend over, the headaches will wake her up. She does not know if she snores and can;t tell me a lot about her sleeping.  No other focal neurologic deficits, associated  symptoms, inciting events or modifiable factors.   Migraine medications tried include: Topamax, baclofen, Excedrin, Tylenol, Zofran, Accupril (ACE inhibitors can be used as migraine prevention), Phenergan   Reviewed notes, labs and imaging from outside physicians, which showed:   I reviewed notes from Abbey Chatters.  Patient has a history of migraines.  Patient reports she often wakes up in the morning with a headache.  She was tried on several migraine medications and it has not helped.   Baclofen has not helped either.  She takes Excedrin Migraine often.  She denies neck tension.  Reports the head pain is in the front near her eyes.  She has blurry/cloudy vision even with her glasses.  Her last eye exam was at least a year ago or maybe more.  She has uncontrolled blood pressure in the office it was 168/97.  She does eat mostly canned foods at home.  She is on Topamax 25 mg twice daily.   REVIEW OF SYSTEMS: Out of a complete 14 system review of symptoms, the patient complains only of the following symptoms, headaches, snoring, knee pain and all other reviewed systems are negative.  ESS: 1/24  ALLERGIES: No Known Allergies  HOME MEDICATIONS: Outpatient Medications Prior to Visit  Medication Sig Dispense Refill   Albuterol Sulfate (PROAIR RESPICLICK) 108 (90 Base) MCG/ACT AEPB INHALE 4 PUFFS INTO THE LUNGS EVERY 4 HOURS AS NEEDED 1 each 2   amLODipine (NORVASC) 10 MG tablet Take 1 tablet (10 mg total) by mouth daily. 90 tablet 1   aspirin EC 81 MG tablet Take 1 tablet (81 mg total) by mouth daily. Swallow whole. 90 tablet 3   azelastine (ASTELIN) 0.1 % nasal spray USE 1 SPRAY EACH NOSTRIL TWICE DAILY 30 mL 1   Bepotastine Besilate 1.5 % SOLN Place 1 drop into both eyes as directed.     Calcium Carb-Cholecalciferol (CALCIUM 600 + D PO) Take by mouth.     diclofenac Sodium (VOLTAREN) 1 % GEL 3 g 3 (three) times daily.     ferrous sulfate 325 (65 FE) MG tablet TAKE 1 TABLET BY MOUTH EVERY DAY WITH BREAKFAST 90 tablet 1   fluticasone (FLONASE) 50 MCG/ACT nasal spray SPRAY 2 SPRAYS INTO EACH NOSTRIL EVERY DAY 48 mL 2   fluticasone furoate-vilanterol (BREO ELLIPTA) 200-25 MCG/ACT AEPB Inhale 1 puff into the lungs daily. 28 each 5   gabapentin (NEURONTIN) 100 MG capsule TAKE 1 CAPSULE (100 MG) BY ORAL ROUTE 3 TIMES PER DAY FOR 90 DAYS 270 capsule 3   hydrochlorothiazide (MICROZIDE) 12.5 MG capsule Take 1 capsule (12.5 mg total) by mouth daily. 90 capsule 3   ipratropium (ATROVENT)  0.03 % nasal spray PLACE 1 SPRAY INTO BOTH NOSTRILS EVERY 6 (SIX) HOURS AS NEEDED. 90 mL 1   JANUVIA 100 MG tablet TAKE 1 TABLET BY MOUTH EVERY DAY 90 tablet 1   losartan (COZAAR) 50 MG tablet Take 50 mg by mouth daily.     metFORMIN (GLUCOPHAGE) 500 MG tablet Take 500 mg by mouth 2 (two) times daily.     montelukast (SINGULAIR) 10 MG tablet TAKE 1 TABLET BY MOUTH EVERY DAY IN THE EVENING 90 tablet 0   omeprazole (PRILOSEC) 40 MG capsule TAKE 1 CAPSULE BY MOUTH EVERY DAY 90 capsule 1   ONE TOUCH ULTRA TEST test strip Check blood sugar once daily as directed  3   rosuvastatin (CRESTOR) 20 MG tablet Take 20 mg by mouth daily.     tiZANidine (ZANAFLEX) 4 MG tablet Take  1 tablet (4 mg total) by mouth every 6 (six) hours as needed (as needed for migraines). 30 tablet 0   topiramate (TOPAMAX) 100 MG tablet TAKE 1 TABLET BY MOUTH EVERYDAY AT BEDTIME 90 tablet 3   levocetirizine (XYZAL) 5 MG tablet TAKE 1 TABLET BY MOUTH EVERY DAY IN THE EVENING (Patient not taking: Reported on 07/10/2022) 90 tablet 1   Facility-Administered Medications Prior to Visit  Medication Dose Route Frequency Provider Last Rate Last Admin   fluticasone furoate-vilanterol (BREO ELLIPTA) 200-25 MCG/ACT 1 puff  1 puff Inhalation Daily Alfonse Spruce, MD        PAST MEDICAL HISTORY: Past Medical History:  Diagnosis Date   Asthma    QVAR daily and Albuterol as needed   Bladder infection    taking Keflex daily    Chronic back pain    reason unknown   Diabetes mellitus    takes Metformin and Tradjenta daily   GERD (gastroesophageal reflux disease)    takes Omeprazole daily   Hard of hearing    Headache    daily.Takes Excedrine daily   Hepatitis C    History of blood transfusion    no abnormal reaction noted   Hyperlipidemia    takes Atorvastatin daily   Hypertension    takes Quinapril daily   Joint pain    Joint swelling    Nocturia    Obesity    Osteoarthritis of left knee 02/23/2012   Seasonal allergies     takes Claritin and Singulair daily.Nasal spray as needed   Shortness of breath dyspnea    with exertion    PAST SURGICAL HISTORY: Past Surgical History:  Procedure Laterality Date   ABDOMINAL HYSTERECTOMY     EYE SURGERY  2021   INJECTION KNEE  04/09/2012   Procedure: KNEE INJECTION;  Surgeon: Eulas Post, MD;  Location: [facility];  Service: Orthopedics;  Laterality: Left;   JOINT REPLACEMENT     acl   bil knees   KNEE ARTHROTOMY Left 08/28/2015   Procedure: LEFT KNEE ARTHROFIBROSIS EXCISION; pylectomy;  Surgeon: Cammy Copa, MD;  Location: [facility];  Service: Orthopedics;  Laterality: Left;   KNEE CLOSED REDUCTION  04/09/2012   Procedure: CLOSED MANIPULATION KNEE;  Surgeon: Eulas Post, MD;  Location: [facility];  Service: Orthopedics;  Laterality: Left;  Manipulation Knee with Anesthesia includes Application of Traction    NERVE SURGERY Left 11/06/2020   Per patient on left knee   TOTAL KNEE ARTHROPLASTY  02/23/2012   Procedure: TOTAL KNEE ARTHROPLASTY;  Surgeon: Eulas Post, MD;  Location: [facility];  Service: Orthopedics;  Laterality: Left;    FAMILY HISTORY: Family History  Problem Relation Age of Onset   Bronchitis Father 71   Alzheimer's disease Mother    Ovarian cancer Mother    Stomach cancer Mother    High blood pressure Sister    High blood pressure Sister    Headache Sister        pt thinks    High blood pressure Sister    High blood pressure Sister        pt thinks   Breast cancer Sister    High blood pressure Sister    High blood pressure Sister    Headache Daughter    Diabetes Daughter    Diabetes Daughter    Diabetes Other    Colon cancer Neg Hx    Esophageal cancer Neg Hx    Rectal cancer  Neg Hx     SOCIAL HISTORY: Social History   Socioeconomic History   Marital status: Widowed    Spouse name: Not on file   Number of children: 4   Years of education: 14   Highest education level: Some college, no  degree  Occupational History   Not on file  Tobacco Use   Smoking status: Never   Smokeless tobacco: Never  Vaping Use   Vaping Use: Never used  Substance and Sexual Activity   Alcohol use: No    Alcohol/week: 0.0 standard drinks of alcohol   Drug use: No   Sexual activity: Yes    Birth control/protection: Surgical  Other Topics Concern   Not on file  Social History Narrative   Diet: none   Do you drink/eat things with caffeine ? Coffee    Material status: widow     What year were you married? 08/01/1978   Do you live in a house, apartment, assisted living,condo, trailer,ect.)? Townhouse (temporary)    Is it one or more stories? Yes   How many persons live in your home? Two   Do you have any pets in your home ? Yes, Shih-tzu   Current or past profession: none   Do you exercise? No  Type & how often: no   Do you have a living will ? No   Do you have a DNR form? No   If not, do you want to discuss one?  Not now   Do you have signed POA /HPOA forms? No    If so, please bring to your appointment.            Update 12/13/2018: pt states she doesn't drink much coffee, tea   She lives alone                     Social Determinants of Health   Financial Resource Strain: Medium Risk (02/11/2018)   Overall Financial Resource Strain (CARDIA)    Difficulty of Paying Living Expenses: Somewhat hard  Food Insecurity: Food Insecurity Present (02/11/2018)   Hunger Vital Sign    Worried About Running Out of Food in the Last Year: Sometimes true    Ran Out of Food in the Last Year: Sometimes true  Transportation Needs: No Transportation Needs (02/11/2018)   PRAPARE - Administrator, Civil Service (Medical): No    Lack of Transportation (Non-Medical): No  Physical Activity: Sufficiently Active (02/11/2018)   Exercise Vital Sign    Days of Exercise per Week: 7 days    Minutes of Exercise per Session: 30 min  Stress: Stress Concern Present (02/11/2018)   [employer] of  Occupational Health - Occupational Stress Questionnaire    Feeling of Stress : To some extent  Social Connections: Somewhat Isolated (02/11/2018)   Social Connection and Isolation Panel [NHANES]    Frequency of Communication with Friends and Family: Twice a week    Frequency of Social Gatherings with Friends and Family: Once a week    Attends Religious Services: More than 4 times per year    Active Member of [employer] or Organizations: No    Attends Banker Meetings: Never    Marital Status: Widowed  Intimate Partner Violence: Not At Risk (02/11/2018)   Humiliation, Afraid, Rape, and Kick questionnaire    Fear of Current or Ex-Partner: No    Emotionally Abused: No    Physically Abused: No    Sexually Abused: No  PHYSICAL EXAM  Vitals:   07/10/22 1117  BP: 131/70  Pulse: 81  Weight: 199 lb 8 oz (90.5 kg)  Height: 5\' 1"  (1.549 m)      Body mass index is 37.7 kg/m.  Generalized: Well developed, in no acute distress  Cardiology: normal rate and rhythm, no murmur noted Neurological examination  Mentation: Alert oriented to time, place, history taking. Follows all commands speech and language fluent Cranial nerve II-XII: Pupils were equal round reactive to light. Extraocular movements were full, visual field were full on confrontational test. Facial sensation and strength were normal. Uvula tongue midline. Head turning and shoulder shrug  were normal and symmetric. Motor: The motor testing reveals 5 over 5 strength of all 4 extremities. Good symmetric motor tone is noted throughout.  Gait and station: has left limp due to left knee brace, uses single prong cane for stability   DIAGNOSTIC DATA (LABS, IMAGING, TESTING) - I reviewed patient records, labs, notes, testing and imaging myself where available.     05/18/2019    9:56 AM  MMSE - Mini Mental State Exam  Orientation to time 5  Orientation to Place 5  Registration 3  Attention/ Calculation 5  Recall 3   Language- name 2 objects 2  Language- repeat 1  Language- follow 3 step command 3  Language- read & follow direction 1  Write a sentence 1  Copy design 1  Total score 30     Lab Results  Component Value Date   WBC 9.1 07/09/2021   HGB 12.3 07/09/2021   HCT 38.9 07/09/2021   MCV 80 07/09/2021   PLT 330 11/12/2020      Component Value Date/Time   NA 139 03/28/2022 1059   K 3.8 03/28/2022 1059   CL 101 03/28/2022 1059   CO2 24 03/28/2022 1059   GLUCOSE 158 (H) 03/28/2022 1059   GLUCOSE 122 (H) 11/12/2020 1102   BUN 11 03/28/2022 1059   CREATININE 0.94 03/28/2022 1059   CREATININE 0.80 11/12/2020 1102   CALCIUM 10.0 03/28/2022 1059   PROT 8.0 11/12/2020 1102   PROT 7.5 11/21/2015 1026   ALBUMIN 3.8 02/08/2019 1752   ALBUMIN 4.4 11/21/2015 1026   AST 11 11/12/2020 1102   ALT 10 11/12/2020 1102   ALKPHOS 176 (H) 02/08/2019 1752   BILITOT 0.4 11/12/2020 1102   BILITOT 0.3 11/21/2015 1026   GFRNONAA 77 11/12/2020 1102   GFRAA 90 11/12/2020 1102   Lab Results  Component Value Date   CHOL 140 08/10/2020   HDL 60 08/10/2020   LDLCALC 65 08/10/2020   TRIG 66 08/10/2020   CHOLHDL 2.3 08/10/2020   Lab Results  Component Value Date   HGBA1C 6.7 (H) 11/12/2020   No results found for: "VITAMINB12" Lab Results  Component Value Date   TSH 0.517 12/13/2018       ASSESSMENT AND PLAN 67 y.o. year old female  has a past medical history of Asthma, Bladder infection, Chronic back pain, Diabetes mellitus, GERD (gastroesophageal reflux disease), Hard of hearing, Headache, Hepatitis C, History of blood transfusion, Hyperlipidemia, Hypertension, Joint pain, Joint swelling, Nocturia, Obesity, Osteoarthritis of left knee (02/23/2012), Seasonal allergies, and Shortness of breath dyspnea. here with     ICD-10-CM   1. OSA on CPAP  G47.33 For home use only DME continuous positive airway pressure (CPAP)    2. Chronic migraine without aura without status migrainosus, not intractable   G43.709       Mrs Cumings is doing well, today.  She will continue topiramate 100mg  daily and tizanidine 4mg  as needed. May consider weaning meds in future if headaches stay well controlled. She was encouraged to continue CPAP nightly for at least 4 hours. Healthy lifestyle habits encouraged. She will follow up in 1 year. She verbalizes understanding and agreement with this plan.    Orders Placed This Encounter  Procedures   For home use only DME continuous positive airway pressure (CPAP)    Supplies    Order Specific Question:   Length of Need    Answer:   Lifetime    Order Specific Question:   Patient has OSA or probable OSA    Answer:   Yes    Order Specific Question:   Is the patient currently using CPAP in the home    Answer:   Yes    Order Specific Question:   Settings    Answer:   Other see comments    Order Specific Question:   CPAP supplies needed    Answer:   Mask, headgear, cushions, filters, heated tubing and water chamber      No orders of the defined types were placed in this encounter.      Shawnie Dapper, FNP-C 07/10/2022, 11:41 AM [facility] [address], [address] [place], [place] [address] [phone]

## 2022-07-10 ENCOUNTER — Encounter: Payer: Self-pay | Admitting: Family Medicine

## 2022-07-10 ENCOUNTER — Ambulatory Visit: Payer: Medicare Other | Admitting: Family Medicine

## 2022-07-10 VITALS — BP 131/70 | HR 81 | Ht 61.0 in | Wt 199.5 lb

## 2022-07-10 DIAGNOSIS — G43709 Chronic migraine without aura, not intractable, without status migrainosus: Secondary | ICD-10-CM

## 2022-07-10 DIAGNOSIS — G4733 Obstructive sleep apnea (adult) (pediatric): Secondary | ICD-10-CM | POA: Diagnosis not present

## 2022-07-13 ENCOUNTER — Other Ambulatory Visit: Payer: Self-pay | Admitting: Allergy & Immunology

## 2022-10-04 ENCOUNTER — Other Ambulatory Visit: Payer: Self-pay | Admitting: Allergy & Immunology

## 2022-10-09 ENCOUNTER — Encounter: Payer: Self-pay | Admitting: Allergy & Immunology

## 2022-10-09 ENCOUNTER — Other Ambulatory Visit: Payer: Self-pay

## 2022-10-09 ENCOUNTER — Ambulatory Visit: Payer: Medicare Other | Admitting: Allergy & Immunology

## 2022-10-09 VITALS — BP 122/66 | HR 76 | Temp 96.7°F | Wt 189.0 lb

## 2022-10-09 DIAGNOSIS — J4540 Moderate persistent asthma, uncomplicated: Secondary | ICD-10-CM

## 2022-10-09 MED ORDER — FLUTICASONE FUROATE-VILANTEROL 200-25 MCG/ACT IN AEPB: 1.0000 | INHALATION_SPRAY | Freq: Every day | RESPIRATORY_TRACT | 5 refills | Status: DC

## 2022-10-09 MED ORDER — MONTELUKAST SODIUM 10 MG PO TABS: ORAL_TABLET | ORAL | 1 refills | Status: DC

## 2022-10-09 MED ORDER — IPRATROPIUM BROMIDE 0.03 % NA SOLN: 1.0000 | Freq: Four times a day (QID) | NASAL | 1 refills | Status: DC | PRN

## 2022-10-09 NOTE — Progress Notes (Signed)
FOLLOW UP  Date of Service/Encounter:  10/09/22   Assessment:   Moderate persistent asthma, uncomplicated    Seasonal and perennial allergic rhinitis (trees, mold, dust mite)   Gastroesophageal reflux disease - adding on a PPI   Anemia   Recurrent infections - most recent workup within normal limits  Plan/Recommendations:   1. Moderate persistent asthma, uncomplicated - Spirometry looks GREAT today with levels above 100%!  - We are not going to make nay changes today.  - Daily controller medication(s): Breo 200/44mcg one puff once daily - Prior to physical activity: albuterol 2 puffs 10-15 minutes before physical activity. - Rescue medications: albuterol 4 puffs every 4-6 hours as needed or albuterol nebulizer one vial every 4-6 hours as needed - Asthma control goals:  * Full participation in all desired activities (may need albuterol before activity) * Albuterol use two time or less a week on average (not counting use with activity) * Cough interfering with sleep two time or less a month * Oral steroids no more than once a year * No hospitalizations  2. Chronic rhinitis - Continue with nasal ipratropium one spray per nostril every six hours AS NEEDED. - Continue with fluticasone nasal spray two sprays per nostril daily (AIM FOR THE EARS). - Continue with Singulair 10mg  daily.   3. GERD  - Continue omeprazole 40mg  daily.  4. Return in about 6 months (around 04/11/2023).    Subjective:   Tabitha Oneal is a 67 y.o. 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 07/19/2019   Abnormal  EKG 03/02/2019   Incomplete left bundle branch block (LBBB) 03/02/2019   Rebound headache 12/13/2018   Overuse of medication 12/13/2018   Intractable chronic migraine without aura and without status migrainosus 12/13/2018   Microcytic anemia 02/24/2018   High risk medication use 08/06/2017   Seasonal and perennial allergic rhinitis 07/30/2017   Recurrent infections 07/30/2017   Cough 06/08/2017   Moderate persistent asthma without complication 06/08/2017   Chronic constipation 02/04/2017   Hyperlipidemia due to type 2 diabetes mellitus (HCC) 02/04/2017   Hallux rigidus, left foot 01/01/2017   Postmenopausal symptoms 02/22/2016   Arthrofibrosis of knee joint 08/28/2015   Left hip pain 10/08/2014   Type 2 diabetes mellitus without complication, without long-term current use of insulin (HCC) 10/08/2014   Hyperlipidemia associated with type 2 diabetes mellitus (HCC) 10/08/2014   Primary hypertension 08/30/2014   Diabetes mellitus, type 2 (HCC) 08/30/2014   Diverticulosis of colon without hemorrhage 08/30/2014   Chronic low back pain 08/30/2014   Chronic pain syndrome 08/30/2014   Asthma in adult without complication 08/30/2014   Contracture of left knee, arthrofibrosis post op TKA 04/09/2012   Osteoarthritis of left knee 02/23/2012    History obtained from: chart review and patient.  Tabitha Oneal is a 67 y.o. female presenting for a follow up visit.  She was last seen in November 2023.  At that time, her spirometry looked great with the level above 100%.  We continued with Breo 200 mcg 1 puff once daily as well as albuterol as needed.  For her rhinitis,  we continue with nasal ipratropium 1 spray per nostril every 6 hours as needed.  We also continue with Flonase as well as Singulair.  GERD was controlled with omeprazole 40 mg daily.  Since last visit, she has done fairly well.  Asthma/Respiratory Symptom History: She is doing well with her  Virgel Bouquet is around $11.40 per refill. She is on the  albuterol. She does not refill this too often. She has not been on prednisone for her symptoms. Tabitha Oneal's asthma has been well controlled. She has not required rescue medication, experienced nocturnal awakenings due to lower respiratory symptoms, nor have activities of daily living been limited. She has required no Emergency Department or Urgent Care visits for her asthma. She has required zero courses of systemic steroids for asthma exacerbations since the last visit. ACT score today is 21, indicating excellent asthma symptom control.   She did have an antibiotic recently for a cough that she could not clear up. It is unclear what this antibiotic was. She did this twice daily for 7 days. Cough is now gone. She did not have a fever. This was all prescribed by her PCP (someone at [facility]) on [facility] in [place]. She sees them once monthly. They are in the Epic.   She has OSA and has a CPAP managed by [facility]. She sees Amy Lomax NP.   Allergic Rhinitis Symptom History: She remains on her nasal spray. It is not clear whether she is using her levocetirizine.  She is on Singulair 10 mg daily which is working well to control her postnasal drip.  She wants to spend a lot of time talking about her pain control, but I tell her that this is not my expertise.  She does have a dedicated pain doctor.  She are pushing for her to have surgery again. They are revisting this in July. She might have a rod placed in her knee. She has had surgery in the past which works temporarily. She has Voltaren gel to use as needed and gabapentin as well as Celebrex . She had her first surgery in 2013 by a surgeon who "completely gave up" on her.   Otherwise, there have been no changes to her past medical history, surgical history, family history, or social history.    Review of Systems  Constitutional: Negative.  Negative for chills, fever, malaise/fatigue and weight loss.  HENT:   Negative for congestion, ear discharge, ear pain and sinus pain.   Eyes:  Negative for pain, discharge and redness.  Respiratory:  Negative for cough, sputum production, shortness of breath and wheezing.   Cardiovascular: Negative.  Negative for chest pain and palpitations.  Gastrointestinal:  Negative for abdominal pain, constipation, diarrhea, heartburn, nausea and vomiting.  Skin: Negative.  Negative for itching and rash.  Neurological:  Negative for dizziness and headaches.  Endo/Heme/Allergies:  Negative for environmental allergies. Does not bruise/bleed easily.       Objective:   Blood pressure 122/66, pulse 76, temperature (!) 96.7 F (35.9 C), temperature source Temporal, weight 189 lb (85.7 kg), SpO2 97 %. Body mass index is 35.71 kg/m.    Physical Exam Vitals reviewed.  Constitutional:      Appearance: She is well-developed.  HENT:     Head: Normocephalic and atraumatic.     Right Ear: Tympanic membrane, ear canal and external ear normal.     Left Ear: Tympanic membrane, ear canal and external ear normal.     Nose: No nasal deformity,  septal deviation, mucosal edema or rhinorrhea.     Right Turbinates: Not enlarged or swollen.     Left Turbinates: Not enlarged or swollen.     Right Sinus: No maxillary sinus tenderness or frontal sinus tenderness.     Left Sinus: No maxillary sinus tenderness or frontal sinus tenderness.     Mouth/Throat:     Mouth: Mucous membranes are not pale and not dry.     Pharynx: Uvula midline.  Eyes:     General: Lids are normal. No allergic shiner.       Right eye: No discharge.        Left eye: No discharge.     Conjunctiva/sclera: Conjunctivae normal.     Right eye: Right conjunctiva is not injected. No chemosis.    Left eye: Left conjunctiva is not injected. No chemosis.    Pupils: Pupils are equal, round, and reactive to light.  Cardiovascular:     Rate and Rhythm: Normal rate and regular rhythm.     Heart sounds: Normal heart  sounds.  Pulmonary:     Effort: Pulmonary effort is normal. No tachypnea, accessory muscle usage or respiratory distress.     Breath sounds: Normal breath sounds. No wheezing, rhonchi or rales.  Chest:     Chest wall: No tenderness.  Lymphadenopathy:     Cervical: No cervical adenopathy.  Skin:    Coloration: Skin is not pale.     Findings: No abrasion, erythema, petechiae or rash. Rash is not papular, urticarial or vesicular.  Neurological:     Mental Status: She is alert.  Psychiatric:        Behavior: Behavior is cooperative.      Diagnostic studies:    Spirometry: results normal (FEV1: 1.88/106%, FVC: 2.47/110%, FEV1/FVC: 76%).    Spirometry consistent with normal pattern.   Allergy Studies: none       Malachi Bonds, MD  [facility]

## 2022-10-09 NOTE — Patient Instructions (Addendum)
1. Moderate persistent asthma, uncomplicated - Spirometry looks GREAT today with levels above 100%!  - We are not going to make nay changes today.  - Daily controller medication(s): Breo 200/12mcg one puff once daily - Prior to physical activity: albuterol 2 puffs 10-15 minutes before physical activity. - Rescue medications: albuterol 4 puffs every 4-6 hours as needed or albuterol nebulizer one vial every 4-6 hours as needed - Asthma control goals:  * Full participation in all desired activities (may need albuterol before activity) * Albuterol use two time or less a week on average (not counting use with activity) * Cough interfering with sleep two time or less a month * Oral steroids no more than once a year * No hospitalizations  2. Chronic rhinitis - Continue with nasal ipratropium one spray per nostril every six hours AS NEEDED. - Continue with fluticasone nasal spray two sprays per nostril daily (AIM FOR THE EARS). - Continue with Singulair 10mg  daily.   3. GERD  - Continue omeprazole 40mg  daily.  4. Return in about 6 months (around 04/11/2023).    Please inform us of any Emergency Department visits, hospitalizations, or changes in symptoms. Call us before going to the ED for breathing or allergy symptoms since we might be able to fit you in for a sick visit. Feel free to contact us anytime with any questions, problems, or concerns.  It was a pleasure to see you again today!  Websites that have reliable patient information: 1. American Academy of Asthma, Allergy, and Immunology: www.aaaai.org 2. Food Allergy Research and Education (FARE): foodallergy.org 3. Mothers of Asthmatics: http://www.asthmacommunitynetwork.org 4. American College of Allergy, Asthma, and Immunology: www.acaai.org   COVID-19 Vaccine Information can be found at: PodExchange.nl For questions related to vaccine distribution or appointments,  please email vaccine@[email or web address] or call [phone].     "Like" [place] on Facebook and Instagram for our latest updates!       Make sure you are registered to vote! If you have moved or changed any of your contact information, you will need to get this updated before voting!  In some cases, you MAY be able to register to vote online: [email or web address]

## 2022-11-06 NOTE — Progress Notes (Signed)
Cardiology Office Note:  .   Date:  11/07/2022  ID:  Tabitha Oneal, DOB 20-Mar-1956, MRN [id number] PCP: Vladimir Crofts, FNP  [facility] Providers Cardiologist:  Christell Constant, MD {   History of Present Illness: .   Tabitha Oneal is a 67 y.o. female with a past medical history of hypertension, diabetes mellitus, hyperlipidemia, known LBBB (incomplete) who presents for follow-up visit.  Was seen by Dr. Cristal Deer in 2021.  Patient was seen in follow-up last October 2023.  Was doing okay.  No interval hospital/ED visits.  No chest pain or pressure.  No SOB/DOE.  No orthopnea, PND.  No weight gain or leg swelling.  No syncope or palpitations.  Blood pressure is well-controlled at 114/68.  After physical therapy BP is usually elevated 140s to 150s.  Recently seen by Dr. Izora Ribas last fall.  Doing well at that time.  Today, she is doing pretty well from a cardiac standpoint.  She had a knee replacement back in 2013 and it gives her trouble from time to time.  She is wearing a brace today on it.  She sees Dr. Hetty Ely in [facility].  She states her blood pressure has been up-and-down.  Well-controlled today at 124/60.  Only drinks about 16 ounces a day of water.  Discussed increasing to 64 ounces daily.  We also discussed wearing lower extremity compression to help with swelling that she experiences from being on her feet all day.  We gave her handout on elastic therapy.   Reports no shortness of breath nor dyspnea on exertion. Reports no chest pain, pressure, or tightness. No edema, orthopnea, PND. Reports no palpitations.    ROS: Please see HPI.  Studies Reviewed: Marland Kitchen       EKG:  not ordered today.     ECHO COMPLETE WO IMAGING ENHANCING AGENT 07/03/2021   IMPRESSIONS     1. Left ventricular ejection fraction, by estimation, is 60 to 65%. The left ventricle has normal function. The left ventricle has no regional wall motion abnormalities. Left ventricular  diastolic parameters are indeterminate. The average left ventricular global longitudinal strain is 23.7 %. The global longitudinal strain is normal. 2. Right ventricular systolic function is normal. The right ventricular size is normal. There is normal pulmonary artery systolic pressure. The estimated right ventricular systolic pressure is 29.4 mmHg. 3. Left atrial size was mildly dilated. 4. The mitral valve is normal in structure. Trivial mitral valve regurgitation. No evidence of mitral stenosis. 5. The aortic valve is tricuspid. Aortic valve regurgitation is not visualized. No aortic stenosis is present. 6. The inferior vena cava is normal in size with greater than 50% respiratory variability, suggesting right atrial pressure of 3 mmHg.   FINDINGS Left Ventricle: Left ventricular ejection fraction, by estimation, is 60 to 65%. The left ventricle has normal function. The left ventricle has no regional wall motion abnormalities. The average left ventricular global longitudinal strain is 23.7 %. The global longitudinal strain is normal. The left ventricular internal cavity size was normal in size. There is no left ventricular hypertrophy. Left ventricular diastolic parameters are indeterminate.   Right Ventricle: The right ventricular size is normal. No increase in right ventricular wall thickness. Right ventricular systolic function is normal. There is normal pulmonary artery systolic pressure. The tricuspid regurgitant velocity is 2.57 m/s, and with an assumed right atrial pressure of 3 mmHg, the estimated right ventricular systolic pressure is 29.4 mmHg.   Left Atrium: Left atrial size was  mildly dilated.   Right Atrium: Right atrial size was normal in size.   Pericardium: There is no evidence of pericardial effusion.   Mitral Valve: The mitral valve is normal in structure. Trivial mitral valve regurgitation. No evidence of mitral valve stenosis.   Tricuspid Valve: The tricuspid valve is  normal in structure. Tricuspid valve regurgitation is trivial.   Aortic Valve: The aortic valve is tricuspid. Aortic valve regurgitation is not visualized. No aortic stenosis is present.   Pulmonic Valve: The pulmonic valve was normal in structure. Pulmonic valve regurgitation is not visualized.   Aorta: The aortic root is normal in size and structure.   Venous: The inferior vena cava is normal in size with greater than 50% respiratory variability, suggesting right atrial pressure of 3 mmHg.   IAS/Shunts: No atrial level shunt detected by color flow Doppler.           Physical Exam:   VS:  BP 124/60   Pulse 74   Ht 5\' 2"  (1.575 m)   Wt 195 lb 12.8 oz (88.8 kg)   SpO2 96%   BMI 35.81 kg/m    Wt Readings from Last 3 Encounters:  11/07/22 195 lb 12.8 oz (88.8 kg)  10/09/22 189 lb (85.7 kg)  07/10/22 199 lb 8 oz (90.5 kg)    GEN: Well nourished, well developed in no acute distress NECK: No JVD; No carotid bruits CARDIAC: RRR, no murmurs, rubs, gallops RESPIRATORY:  Clear to auscultation without rales, wheezing or rhonchi  ABDOMEN: Soft, non-tender, non-distended EXTREMITIES:  No edema; No deformity   ASSESSMENT AND PLAN: .   1.  Primary hypertension -Well-controlled today, 120/60 -Continue current medications with amlodipine 10 mg daily, aspirin 81 mg daily, losartan 50 mg daily, Crestor 20 mg daily -Discussed proper hydration and low-sodium, heart healthy diet  2.  Bilateral lower extremity edema  -no longer issues other than when she is on her feet most of the day -Discussed elastic therapy in [place], provided handout -Recommended lower extremity compression while up moving around and removing at night  3.  Morbid obesity -Continue exercising as able with knee injury -Continue low-sodium, heart healthy diet  4.  Hyperlipidemia associated with type 2 diabetes mellitus -Last LDL 65 -Continue with Crestor 20 mg daily -Will be due for an updated lipid panel if not  already done when she comes back to the clinic      Dispo: Please follow-up in 3 to 4 months with myself or Dr. Izora Ribas  Signed, Sharlene Dory, PA-C

## 2022-11-07 ENCOUNTER — Ambulatory Visit: Payer: Medicare Other | Attending: Physician Assistant | Admitting: Physician Assistant

## 2022-11-07 ENCOUNTER — Encounter: Payer: Self-pay | Admitting: Physician Assistant

## 2022-11-07 VITALS — BP 124/60 | HR 74 | Ht 62.0 in | Wt 195.8 lb

## 2022-11-07 DIAGNOSIS — R600 Localized edema: Secondary | ICD-10-CM

## 2022-11-07 DIAGNOSIS — E785 Hyperlipidemia, unspecified: Secondary | ICD-10-CM

## 2022-11-07 DIAGNOSIS — E1169 Type 2 diabetes mellitus with other specified complication: Secondary | ICD-10-CM

## 2022-11-07 DIAGNOSIS — I10 Essential (primary) hypertension: Secondary | ICD-10-CM | POA: Diagnosis not present

## 2022-11-07 DIAGNOSIS — Z7984 Long term (current) use of oral hypoglycemic drugs: Secondary | ICD-10-CM

## 2022-11-07 NOTE — Patient Instructions (Signed)
Medication Instructions:   Your physician recommends that you continue on your current medications as directed. Please refer to the Current Medication list given to you today.   *If you need a refill on your cardiac medications before your next appointment, please call your pharmacy*   Lab Work: NONE ORDERED  TODAY    If you have labs (blood work) drawn today and your tests are completely normal, you will receive your results only by: MyChart Message (if you have MyChart) OR A paper copy in the mail If you have any lab test that is abnormal or we need to change your treatment, we will call you to review the results.    Testing/Procedures: NONE ORDERED  TODAY     Follow-Up: At [facility], you and your health needs are our priority.  As part of our continuing mission to provide you with exceptional heart care, we have created designated Provider Care Teams.  These Care Teams include your primary Cardiologist (physician) and Advanced Practice Providers (APPs -  Physician Assistants and Nurse Practitioners) who all work together to provide you with the care you need, when you need it.  We recommend signing up for the patient portal called "MyChart".  Sign up information is provided on this After Visit Summary.  MyChart is used to connect with patients for Virtual Visits (Telemedicine).  Patients are able to view lab/test results, encounter notes, upcoming appointments, etc.  Non-urgent messages can be sent to your provider as well.   To learn more about what you can do with MyChart, go to [email or web address].    Your next appointment:    4-5 month(s)  Provider:    Christell Constant, MD     Other Instructions    Heart-Healthy Eating Plan Eating a healthy diet is important for the health of your heart. A heart-healthy eating plan includes: Eating less unhealthy fats. Eating more healthy fats. Eating less salt in your food. Salt is also called sodium. Making  other changes in your diet. Talk with your doctor or a diet specialist (dietitian) to create an eating plan that is right for you. What is my plan? Your doctor may recommend an eating plan that includes: Total fat: ______% or less of total calories a day. Saturated fat: ______% or less of total calories a day. Cholesterol: less than _________mg a day. Sodium: less than _________mg a day. What are tips for following this plan? Cooking Avoid frying your food. Try to bake, boil, grill, or broil it instead. You can also reduce fat by: Removing the skin from poultry. Removing all visible fats from meats. Steaming vegetables in water or broth. Meal planning  At meals, divide your plate into four equal parts: Fill one-half of your plate with vegetables and green salads. Fill one-fourth of your plate with whole grains. Fill one-fourth of your plate with lean protein foods. Eat 2-4 cups of vegetables per day. One cup of vegetables is: 1 cup (91 g) broccoli or cauliflower florets. 2 medium carrots. 1 large bell pepper. 1 large sweet potato. 1 large tomato. 1 medium white potato. 2 cups (150 g) raw leafy greens. Eat 1-2 cups of fruit per day. One cup of fruit is: 1 small apple 1 large banana 1 cup (237 g) mixed fruit, 1 large orange,  cup (82 g) dried fruit, 1 cup (240 mL) 100% fruit juice. Eat more foods that have soluble fiber. These are apples, broccoli, carrots, beans, peas, and barley. Try to get 20-30 g of  fiber per day. Eat 4-5 servings of nuts, legumes, and seeds per week: 1 serving of dried beans or legumes equals  cup (90 g) cooked. 1 serving of nuts is  oz (12 almonds, 24 pistachios, or 7 walnut halves). 1 serving of seeds equals  oz (8 g). General information Eat more home-cooked food. Eat less restaurant, buffet, and fast food. Limit or avoid alcohol. Limit foods that are high in starch and sugar. Avoid fried foods. Lose weight if you are overweight. Keep track  of how much salt (sodium) you eat. This is important if you have high blood pressure. Ask your doctor to tell you more about this. Try to add vegetarian meals each week. Fats Choose healthy fats. These include olive oil and canola oil, flaxseeds, walnuts, almonds, and seeds. Eat more omega-3 fats. These include salmon, mackerel, sardines, tuna, flaxseed oil, and ground flaxseeds. Try to eat fish at least 2 times each week. Check food labels. Avoid foods with trans fats or high amounts of saturated fat. Limit saturated fats. These are often found in animal products, such as meats, butter, and cream. These are also found in plant foods, such as palm oil, palm kernel oil, and coconut oil. Avoid foods with partially hydrogenated oils in them. These have trans fats. Examples are stick margarine, some tub margarines, cookies, crackers, and other baked goods. What foods should I eat? Fruits All fresh, canned (in natural juice), or frozen fruits. Vegetables Fresh or frozen vegetables (raw, steamed, roasted, or grilled). Green salads. Grains Most grains. Choose whole wheat and whole grains most of the time. Rice and pasta, including brown rice and pastas made with whole wheat. Meats and other proteins Lean, well-trimmed beef, veal, pork, and lamb. Chicken and [place] without skin. All fish and shellfish. Wild duck, rabbit, pheasant, and venison. Egg whites or low-cholesterol egg substitutes. Dried beans, peas, lentils, and tofu. Seeds and most nuts. Dairy Low-fat or nonfat cheeses, including ricotta and mozzarella. Skim or 1% milk that is liquid, powdered, or evaporated. Buttermilk that is made with low-fat milk. Nonfat or low-fat yogurt. Fats and oils Non-hydrogenated (trans-free) margarines. Vegetable oils, including soybean, sesame, sunflower, olive, peanut, safflower, corn, canola, and cottonseed. Salad dressings or mayonnaise made with a vegetable oil. Beverages Mineral water. Coffee and tea. Diet  carbonated beverages. Sweets and desserts Sherbet, gelatin, and fruit ice. Small amounts of dark chocolate. Limit all sweets and desserts. Seasonings and condiments All seasonings and condiments. The items listed above may not be a complete list of foods and drinks you can eat. Contact a dietitian for more options. What foods should I avoid? Fruits Canned fruit in heavy syrup. Fruit in cream or butter sauce. Fried fruit. Limit coconut. Vegetables Vegetables cooked in cheese, cream, or butter sauce. Fried vegetables. Grains Breads that are made with saturated or trans fats, oils, or whole milk. Croissants. Sweet rolls. Donuts. High-fat crackers, such as cheese crackers. Meats and other proteins Fatty meats, such as hot dogs, ribs, sausage, bacon, rib-eye roast or steak. High-fat deli meats, such as salami and bologna. Caviar. Domestic duck and goose. Organ meats, such as liver. Dairy Cream, sour cream, cream cheese, and creamed cottage cheese. Whole-milk cheeses. Whole or 2% milk that is liquid, evaporated, or condensed. Whole buttermilk. Cream sauce or high-fat cheese sauce. Yogurt that is made from whole milk. Fats and oils Meat fat, or shortening. Cocoa butter, hydrogenated oils, palm oil, coconut oil, palm kernel oil. Solid fats and shortenings, including bacon fat, salt pork, lard, and  butter. Nondairy cream substitutes. Salad dressings with cheese or sour cream. Beverages Regular sodas and juice drinks with added sugar. Sweets and desserts Frosting. Pudding. Cookies. Cakes. Pies. Milk chocolate or white chocolate. Buttered syrups. Full-fat ice cream or ice cream drinks. The items listed above may not be a complete list of foods and drinks to avoid. Contact a dietitian for more information. Summary Heart-healthy meal planning includes eating less unhealthy fats, eating more healthy fats, and making other changes in your diet. Eat a balanced diet. This includes fruits and vegetables,  low-fat or nonfat dairy, lean protein, nuts and legumes, whole grains, and heart-healthy oils and fats. This information is not intended to replace advice given to you by your health care provider. Make sure you discuss any questions you have with your health care provider. Document Revised: 06/17/2021 Document Reviewed: 06/17/2021 Elsevier Patient Education  2024 [employer]   Low-Sodium Eating Plan Salt (sodium) helps you keep a healthy balance of fluids in your body. Too much sodium can raise your blood pressure. It can also cause fluid and waste to be held in your body. Your health care provider or dietitian may recommend a low-sodium eating plan if you have high blood pressure (hypertension), kidney disease, liver disease, or heart failure. Eating less sodium can help lower your blood pressure and reduce swelling. It can also protect your heart, liver, and kidneys. What are tips for following this plan? Reading food labels  Check food labels for the amount of sodium per serving. If you eat more than one serving, you must multiply the listed amount by the number of servings. Choose foods with less than 140 milligrams (mg) of sodium per serving. Avoid foods with 300 mg of sodium or more per serving. Always check how much sodium is in a product, even if the label says "unsalted" or "no salt added." Shopping  Buy products labeled as "low-sodium" or "no salt added." Buy fresh foods. Avoid canned foods and pre-made or frozen meals. Avoid canned, cured, or processed meats. Buy breads that have less than 80 mg of sodium per slice. Cooking  Eat more home-cooked food. Try to eat less restaurant, buffet, and fast food. Try not to add salt when you cook. Use salt-free seasonings or herbs instead of table salt or sea salt. Check with your provider or pharmacist before using salt substitutes. Cook with plant-based oils, such as canola, sunflower, or olive oil. Meal planning When eating at a  restaurant, ask if your food can be made with less salt or no salt. Avoid dishes labeled as brined, pickled, cured, or smoked. Avoid dishes made with soy sauce, miso, or teriyaki sauce. Avoid foods that have monosodium glutamate (MSG) in them. MSG may be added to some restaurant food, sauces, soups, bouillon, and canned foods. Make meals that can be grilled, baked, poached, roasted, or steamed. These are often made with less sodium. General information Try to limit your sodium intake to 1,500-2,300 mg each day, or the amount told by your provider. What foods should I eat? Fruits Fresh, frozen, or canned fruit. Fruit juice. Vegetables Fresh or frozen vegetables. "No salt added" canned vegetables. "No salt added" tomato sauce and paste. Low-sodium or reduced-sodium tomato and vegetable juice. Grains Low-sodium cereals, such as oats, puffed wheat and rice, and shredded wheat. Low-sodium crackers. Unsalted rice. Unsalted pasta. Low-sodium bread. Whole grain breads and whole grain pasta. Meats and other proteins Fresh or frozen meat, poultry, seafood, and fish. These should have no added salt. Low-sodium  canned tuna and salmon. Unsalted nuts. Dried peas, beans, and lentils without added salt. Unsalted canned beans. Eggs. Unsalted nut butters. Dairy Milk. Soy milk. Cheese that is naturally low in sodium, such as ricotta cheese, fresh mozzarella, or Swiss cheese. Low-sodium or reduced-sodium cheese. Cream cheese. Yogurt. Seasonings and condiments Fresh and dried herbs and spices. Salt-free seasonings. Low-sodium mustard and ketchup. Sodium-free salad dressing. Sodium-free light mayonnaise. Fresh or refrigerated horseradish. Lemon juice. Vinegar. Other foods Homemade, reduced-sodium, or low-sodium soups. Unsalted popcorn and pretzels. Low-salt or salt-free chips. The items listed above may not be all the foods and drinks you can have. Talk to a dietitian to learn more. What foods should I  avoid? Vegetables Sauerkraut, pickled vegetables, and relishes. Olives. [place] fries. Onion rings. Regular canned vegetables, except low-sodium or reduced-sodium items. Regular canned tomato sauce and paste. Regular tomato and vegetable juice. Frozen vegetables in sauces. Grains Instant hot cereals. Bread stuffing, pancake, and biscuit mixes. Croutons. Seasoned rice or pasta mixes. Noodle soup cups. Boxed or frozen macaroni and cheese. Regular salted crackers. Self-rising flour. Meats and other proteins Meat or fish that is salted, canned, smoked, spiced, or pickled. Precooked or cured meat, such as sausages or meat loaves. Tomasa Blase. Ham. Pepperoni. Hot dogs. Corned beef. Chipped beef. Salt pork. Jerky. Pickled herring, anchovies, and sardines. Regular canned tuna. Salted nuts. Dairy Processed cheese and cheese spreads. Hard cheeses. Cheese curds. Blue cheese. Feta cheese. String cheese. Regular cottage cheese. Buttermilk. Canned milk. Fats and oils Salted butter. Regular margarine. Ghee. Bacon fat. Seasonings and condiments Onion salt, garlic salt, seasoned salt, table salt, and sea salt. Canned and packaged gravies. Worcestershire sauce. Tartar sauce. Barbecue sauce. Teriyaki sauce. Soy sauce, including reduced-sodium soy sauce. Steak sauce. Fish sauce. Oyster sauce. Cocktail sauce. Horseradish that you find on the shelf. Regular ketchup and mustard. Meat flavorings and tenderizers. Bouillon cubes. Hot sauce. Pre-made or packaged marinades. Pre-made or packaged taco seasonings. Relishes. Regular salad dressings. Salsa. Other foods Salted popcorn and pretzels. Corn chips and puffs. Potato and tortilla chips. Canned or dried soups. Pizza. Frozen entrees and pot pies. The items listed above may not be all the foods and drinks you should avoid. Talk to a dietitian to learn more. This information is not intended to replace advice given to you by your health care provider. Make sure you discuss any  questions you have with your health care provider. Document Revised: 05/29/2022 Document Reviewed: 05/29/2022 Elsevier Patient Education  2024 [employer].

## 2022-11-13 ENCOUNTER — Other Ambulatory Visit: Payer: Self-pay | Admitting: Internal Medicine

## 2022-12-05 ENCOUNTER — Other Ambulatory Visit: Payer: Self-pay | Admitting: *Deleted

## 2022-12-05 MED ORDER — FLUTICASONE PROPIONATE 50 MCG/ACT NA SUSP: NASAL | 1 refills | Status: DC

## 2023-01-15 ENCOUNTER — Encounter: Payer: Self-pay | Admitting: Family Medicine

## 2023-01-15 ENCOUNTER — Telehealth: Payer: Self-pay | Admitting: Family Medicine

## 2023-01-15 NOTE — Telephone Encounter (Signed)
LVM and sent mychart msg informing pt of r/s needed for 07/13/23 appt- Amy out.

## 2023-02-09 ENCOUNTER — Other Ambulatory Visit: Payer: Self-pay

## 2023-02-09 MED ORDER — TOPIRAMATE 100 MG PO TABS: ORAL_TABLET | ORAL | 3 refills | Status: DC

## 2023-04-14 ENCOUNTER — Encounter: Payer: Self-pay | Admitting: Internal Medicine

## 2023-04-14 ENCOUNTER — Ambulatory Visit: Payer: Medicare Other | Admitting: Allergy & Immunology

## 2023-04-14 ENCOUNTER — Ambulatory Visit: Payer: Medicare Other | Attending: Internal Medicine | Admitting: Internal Medicine

## 2023-04-14 VITALS — BP 130/60 | HR 80 | Ht 63.0 in | Wt 198.0 lb

## 2023-04-14 DIAGNOSIS — R600 Localized edema: Secondary | ICD-10-CM | POA: Diagnosis not present

## 2023-04-14 DIAGNOSIS — I10 Essential (primary) hypertension: Secondary | ICD-10-CM

## 2023-04-14 DIAGNOSIS — E785 Hyperlipidemia, unspecified: Secondary | ICD-10-CM | POA: Diagnosis not present

## 2023-04-14 DIAGNOSIS — E1169 Type 2 diabetes mellitus with other specified complication: Secondary | ICD-10-CM

## 2023-04-14 NOTE — Progress Notes (Signed)
Cardiology Office Note:    Date:  04/14/2023   ID:  Tabitha Oneal, DOB [birth year], MRN [id number]  PCP:  Vladimir Crofts, FNP   [facility] Providers Cardiologist:  Christell Constant, MD     Referring MD: Vladimir Crofts, FNP   NF:[id number] prevention   History of Present Illness:    Tabitha Oneal is a 67 y.o. female with a hx of HTN, DM, HLD, with a known iLBBB. Last saw Dr. Cristal Deer in 2021; asked for [facility]. Eval due to close f/u. 2023: Saw Tessa- mild LE swelling otherwise well.  Discussed the use of AI scribe software for clinical note transcription with the patient, who gave verbal consent to proceed.  Tabitha Oneal, a 67 year old individual with a history of hypertension, diabetes, hyperlipidemia, and an incomplete left bundle branch block, presents with ongoing issues related to bilateral lower extremity edema and weight management. She has been working on exercise interventions for weight management, but progress has been slow due to orthopedic issues.  In the past, she underwent knee replacement surgery, which has been causing her significant discomfort and limited mobility. She reports that she might need another knee surgery due to persistent issues. This has significantly impacted her ability to engage in regular physical activities, including walking. She reports that she can only walk short distances, such as around a parking lot, and even then, she experiences discomfort.  She is mildly active with her dog.  She also reports that she has been experiencing issues with both knees, which further limits her mobility. Despite these challenges, she has been able to maintain control of her blood pressure and cholesterol levels. She has no reported symptoms of chest pain or breathing difficulties. However, her limited mobility due to knee issues is a significant concern, especially considering potential upcoming knee surgery.   Past Medical  History:  Diagnosis Date   Asthma    QVAR daily and Albuterol as needed   Bladder infection    taking Keflex daily    Chronic back pain    reason unknown   Diabetes mellitus    takes Metformin and Tradjenta daily   GERD (gastroesophageal reflux disease)    takes Omeprazole daily   Hard of hearing    Headache    daily.Takes Excedrine daily   Hepatitis C    History of blood transfusion    no abnormal reaction noted   Hyperlipidemia    takes Atorvastatin daily   Hypertension    takes Quinapril daily   Joint pain    Joint swelling    Nocturia    Obesity    Osteoarthritis of left knee 02/23/2012   Seasonal allergies    takes Claritin and Singulair daily.Nasal spray as needed   Shortness of breath dyspnea    with exertion    Past Surgical History:  Procedure Laterality Date   ABDOMINAL HYSTERECTOMY     EYE SURGERY  2021   INJECTION KNEE  04/09/2012   Procedure: KNEE INJECTION;  Surgeon: Eulas Post, MD;  Location: [facility];  Service: Orthopedics;  Laterality: Left;   JOINT REPLACEMENT     acl   bil knees   KNEE ARTHROTOMY Left 08/28/2015   Procedure: LEFT KNEE ARTHROFIBROSIS EXCISION; pylectomy;  Surgeon: Cammy Copa, MD;  Location: [facility];  Service: Orthopedics;  Laterality: Left;   KNEE CLOSED REDUCTION  04/09/2012   Procedure: CLOSED MANIPULATION KNEE;  Surgeon: Eulas Post, MD;  Location: [facility]  [place];  Service: Orthopedics;  Laterality: Left;  Manipulation Knee with Anesthesia includes Application of Traction    NERVE SURGERY Left 11/06/2020   Per patient on left knee   TOTAL KNEE ARTHROPLASTY  02/23/2012   Procedure: TOTAL KNEE ARTHROPLASTY;  Surgeon: Eulas Post, MD;  Location: [facility] OR;  Service: Orthopedics;  Laterality: Left;    Current Medications: Current Meds  Medication Sig   Albuterol Sulfate (PROAIR RESPICLICK) 108 (90 Base) MCG/ACT AEPB INHALE 4 PUFFS INTO THE LUNGS EVERY 4 HOURS AS NEEDED   amLODipine  (NORVASC) 10 MG tablet Take 1 tablet (10 mg total) by mouth daily.   aspirin EC 81 MG tablet Take 1 tablet (81 mg total) by mouth daily. Swallow whole.   azelastine (ASTELIN) 0.1 % nasal spray USE 1 SPRAY EACH NOSTRIL TWICE DAILY   Bepotastine Besilate 1.5 % SOLN PLACE 1 DROP INTO BOTH EYES IN THE MORNING AND AT BEDTIME.   Calcium Carb-Cholecalciferol (CALCIUM 600 + D PO) Take by mouth.   celecoxib (CELEBREX) 200 MG capsule Take 200 mg by mouth 2 (two) times daily as needed.   diclofenac Sodium (VOLTAREN) 1 % GEL 3 g 3 (three) times daily.   fluticasone (FLONASE) 50 MCG/ACT nasal spray SPRAY 2 SPRAYS INTO EACH NOSTRIL EVERY DAY   fluticasone furoate-vilanterol (BREO ELLIPTA) 200-25 MCG/ACT AEPB Inhale 1 puff into the lungs daily.   gabapentin (NEURONTIN) 100 MG capsule TAKE 1 CAPSULE (100 MG) BY ORAL ROUTE 3 TIMES PER DAY FOR 90 DAYS   hydrochlorothiazide (MICROZIDE) 12.5 MG capsule TAKE 1 CAPSULE BY MOUTH EVERY DAY   ipratropium (ATROVENT) 0.03 % nasal spray Place 1 spray into both nostrils every 6 (six) hours as needed.   JANUVIA 100 MG tablet TAKE 1 TABLET BY MOUTH EVERY DAY   levocetirizine (XYZAL) 5 MG tablet TAKE 1 TABLET BY MOUTH EVERY DAY IN THE EVENING   losartan (COZAAR) 50 MG tablet Take 50 mg by mouth daily.   metFORMIN (GLUCOPHAGE) 500 MG tablet Take 500 mg by mouth 2 (two) times daily.   montelukast (SINGULAIR) 10 MG tablet TAKE 1 TABLET BY MOUTH EVERY DAY IN THE EVENING   omeprazole (PRILOSEC) 40 MG capsule TAKE 1 CAPSULE BY MOUTH EVERY DAY   ONE TOUCH ULTRA TEST test strip Check blood sugar once daily as directed   rosuvastatin (CRESTOR) 20 MG tablet Take 20 mg by mouth daily.   tiZANidine (ZANAFLEX) 4 MG tablet Take 1 tablet (4 mg total) by mouth every 6 (six) hours as needed (as needed for migraines).   topiramate (TOPAMAX) 100 MG tablet TAKE 1 TABLET BY MOUTH EVERYDAY AT BEDTIME     Allergies:   Patient has no known allergies.   Social History   Socioeconomic History    Marital status: Widowed    Spouse name: Not on file   Number of children: 4   Years of education: 14   Highest education level: Some college, no degree  Occupational History   Not on file  Tobacco Use   Smoking status: Never   Smokeless tobacco: Never  Vaping Use   Vaping status: Never Used  Substance and Sexual Activity   Alcohol use: No    Alcohol/week: 0.0 standard drinks of alcohol   Drug use: No   Sexual activity: Yes    Birth control/protection: Surgical  Other Topics Concern   Not on file  Social History Narrative   Diet: none   Do you drink/eat things with caffeine ? Coffee  Material status: widow     What year were you married? 08/01/1978   Do you live in a house, apartment, assisted living,condo, trailer,ect.)? Townhouse (temporary)    Is it one or more stories? Yes   How many persons live in your home? Two   Do you have any pets in your home ? Yes, Shih-tzu   Current or past profession: none   Do you exercise? No  Type & how often: no   Do you have a living will ? No   Do you have a DNR form? No   If not, do you want to discuss one?  Not now   Do you have signed POA /HPOA forms? No    If so, please bring to your appointment.            Update 12/13/2018: pt states she doesn't drink much coffee, tea   She lives alone                     Social Determinants of Health   Financial Resource Strain: Medium Risk (02/11/2018)   Overall Financial Resource Strain (CARDIA)    Difficulty of Paying Living Expenses: Somewhat hard  Food Insecurity: Food Insecurity Present (02/11/2018)   Hunger Vital Sign    Worried About Running Out of Food in the Last Year: Sometimes true    Ran Out of Food in the Last Year: Sometimes true  Transportation Needs: No Transportation Needs (02/11/2018)   PRAPARE - Administrator, Civil Service (Medical): No    Lack of Transportation (Non-Medical): No  Physical Activity: Sufficiently Active (02/11/2018)   Exercise Vital Sign     Days of Exercise per Week: 7 days    Minutes of Exercise per Session: 30 min  Stress: Stress Concern Present (02/11/2018)   [employer] of Occupational Health - Occupational Stress Questionnaire    Feeling of Stress : To some extent  Social Connections: Somewhat Isolated (02/11/2018)   Social Connection and Isolation Panel [NHANES]    Frequency of Communication with Friends and Family: Twice a week    Frequency of Social Gatherings with Friends and Family: Once a week    Attends Religious Services: More than 4 times per year    Active Member of [employer] or Organizations: No    Attends Banker Meetings: Never    Marital Status: Widowed     Family History: The patient's family history includes Alzheimer's disease in her mother; Breast cancer in her sister; Bronchitis (age of onset: 73) in her father; Diabetes in her daughter, daughter and another family member; Headache in her daughter and sister; High blood pressure in her sister, sister, sister, sister, sister, and sister; Ovarian cancer in her mother; Stomach cancer in her mother. There is no history of Colon cancer, Esophageal cancer, or Rectal cancer.  ROS:   Please see the history of present illness.     EKGs/Labs/Other Studies Reviewed:    The following studies were reviewed today:  Cardiac Studies & Procedures       ECHOCARDIOGRAM  ECHOCARDIOGRAM COMPLETE 07/03/2021  Narrative ECHOCARDIOGRAM REPORT    Patient Name:   Tabitha Oneal Date of Exam: 07/03/2021 Medical Rec #:  [id number]           Height:       62.0 in Accession #:    [id number]          Weight:       200.0 lb Date of  Birth:  06/01/1955           BSA:          1.912 m Patient Age:    65 years            BP:           142/74 mmHg Patient Gender: F                   HR:           74 bpm. Exam Location:  [address]  Procedure: 2D Echo, 3D Echo, Cardiac Doppler, Color Doppler and Strain Analysis  Indications:    I10  Hypertension  History:        Patient has prior history of Echocardiogram examinations, most recent 12/20/2015. Signs/Symptoms:Shortness of Breath; Risk Factors:Hypertension, Diabetes, Dyslipidemia and Sleep Apnea. Obesity.  Sonographer:    Farrel Conners RDCS Referring Phys: Devonne Doughty STOWE  IMPRESSIONS   1. Left ventricular ejection fraction, by estimation, is 60 to 65%. The left ventricle has normal function. The left ventricle has no regional wall motion abnormalities. Left ventricular diastolic parameters are indeterminate. The average left ventricular global longitudinal strain is 23.7 %. The global longitudinal strain is normal. 2. Right ventricular systolic function is normal. The right ventricular size is normal. There is normal pulmonary artery systolic pressure. The estimated right ventricular systolic pressure is 29.4 mmHg. 3. Left atrial size was mildly dilated. 4. The mitral valve is normal in structure. Trivial mitral valve regurgitation. No evidence of mitral stenosis. 5. The aortic valve is tricuspid. Aortic valve regurgitation is not visualized. No aortic stenosis is present. 6. The inferior vena cava is normal in size with greater than 50% respiratory variability, suggesting right atrial pressure of 3 mmHg.  FINDINGS Left Ventricle: Left ventricular ejection fraction, by estimation, is 60 to 65%. The left ventricle has normal function. The left ventricle has no regional wall motion abnormalities. The average left ventricular global longitudinal strain is 23.7 %. The global longitudinal strain is normal. The left ventricular internal cavity size was normal in size. There is no left ventricular hypertrophy. Left ventricular diastolic parameters are indeterminate.  Right Ventricle: The right ventricular size is normal. No increase in right ventricular wall thickness. Right ventricular systolic function is normal. There is normal pulmonary artery systolic pressure. The tricuspid  regurgitant velocity is 2.57 m/s, and with an assumed right atrial pressure of 3 mmHg, the estimated right ventricular systolic pressure is 29.4 mmHg.  Left Atrium: Left atrial size was mildly dilated.  Right Atrium: Right atrial size was normal in size.  Pericardium: There is no evidence of pericardial effusion.  Mitral Valve: The mitral valve is normal in structure. Trivial mitral valve regurgitation. No evidence of mitral valve stenosis.  Tricuspid Valve: The tricuspid valve is normal in structure. Tricuspid valve regurgitation is trivial.  Aortic Valve: The aortic valve is tricuspid. Aortic valve regurgitation is not visualized. No aortic stenosis is present.  Pulmonic Valve: The pulmonic valve was normal in structure. Pulmonic valve regurgitation is not visualized.  Aorta: The aortic root is normal in size and structure.  Venous: The inferior vena cava is normal in size with greater than 50% respiratory variability, suggesting right atrial pressure of 3 mmHg.  IAS/Shunts: No atrial level shunt detected by color flow Doppler.   LEFT VENTRICLE PLAX 2D LVIDd:         4.60 cm   Diastology LVIDs:         2.70 cm  LV e' medial:    11.70 cm/s LV PW:         1.10 cm   LV E/e' medial:  10.6 LV IVS:        0.90 cm   LV e' lateral:   9.48 cm/s LVOT diam:     1.95 cm   LV E/e' lateral: 13.0 LV SV:         65 LV SV Index:   34        2D Longitudinal Strain LVOT Area:     2.99 cm  2D Strain GLS (A2C):   25.1 % 2D Strain GLS (A3C):   23.2 % 2D Strain GLS (A4C):   22.9 % 2D Strain GLS Avg:     23.7 %  3D Volume EF: 3D EF:        68 % LV EDV:       132 ml LV ESV:       42 ml LV SV:        90 ml  RIGHT VENTRICLE RV S prime:     18.70 cm/s TAPSE (M-mode): 2.9 cm  LEFT ATRIUM             Index        RIGHT ATRIUM           Index LA diam:        4.50 cm 2.35 cm/m   RA Area:     15.30 cm LA Vol (A2C):   66.9 ml 35.00 ml/m  RA Volume:   39.80 ml  20.82 ml/m LA Vol (A4C):   68.5  ml 35.83 ml/m LA Biplane Vol: 68.0 ml 35.57 ml/m AORTIC VALVE LVOT Vmax:   98.57 cm/s LVOT Vmean:  68.925 cm/s LVOT VTI:    0.217 m  AORTA Ao Root diam: 2.30 cm Ao Asc diam:  2.45 cm  MITRAL VALVE                TRICUSPID VALVE MV Area (PHT): cm          TR Peak grad:   26.4 mmHg MV Decel Time: 208 msec     TR Vmax:        257.00 cm/s MV E velocity: 123.50 cm/s MV A velocity: 114.00 cm/s  SHUNTS MV E/A ratio:  1.08         Systemic VTI:  0.22 m Systemic Diam: 1.95 cm  Dalton McleanMD Electronically signed by Wilfred Lacy Signature Date/Time: 07/03/2021/6:10:42 PM    Final             Recent Labs: No results found for requested labs within last 365 days.  Recent Lipid Panel    Component Value Date/Time   CHOL 140 08/10/2020 1045   CHOL 140 11/21/2015 1026   TRIG 66 08/10/2020 1045   HDL 60 08/10/2020 1045   HDL 55 11/21/2015 1026   CHOLHDL 2.3 08/10/2020 1045   VLDL 11 02/20/2016 1000   LDLCALC 65 08/10/2020 1045    Physical Exam:    VS:  BP 130/60   Pulse 80   Ht 5\' 3"  (1.6 m)   Wt 198 lb (89.8 kg)   SpO2 97%   BMI 35.07 kg/m     Wt Readings from Last 3 Encounters:  04/14/23 198 lb (89.8 kg)  11/07/22 195 lb 12.8 oz (88.8 kg)  10/09/22 189 lb (85.7 kg)    Gen: No distress, morbid obesity Neck: No JVD Cardiac: No Rubs or Gallops, no murmur,  RRR +2 radial pulses Respiratory: Clear to auscultation bilaterally, normal effort, normal  respiratory rate GI: Soft, nontender, non-distended  MS: non pitting bilateral leg edema;  moves all extremities Integument: Skin feels warm Neuro:  At time of evaluation, alert and oriented to person/place/time/situation  Psych: Normal affect, patient feels ok  ASSESSMENT:    1. Essential hypertension   2. Hyperlipidemia associated with type 2 diabetes mellitus (HCC)   3. Bilateral lower extremity edema    PLAN:    Preoperative Evaluation for Knee Surgery 67 year old with hypertension, diabetes,  hyperlipidemia, and incomplete left bundle branch block. Minimally active due to orthopedic issues and potentially scheduled for left knee surgery. Stress test necessary to ensure cardiovascular safety before surgery due to low functional METs. Lexiscan stress test will be used due to inability to walk far. If results are favorable, surgery can proceed safely. - Order Lexiscan stress test for evaluation of high-risk coronary disease (consented, she is amenable so low as it is non exercise; would not in the setting of LBBB) - Send results to the surgeon if favorable  Incomplete Left Bundle Branch Block - no issues  Hypertension Blood pressure is well controlled.  Hyperlipidemia - Continue current lipid-lowering therapy  Type 2 Diabetes Mellitus - in the future we could consider GLP1-RA  Morbid Obesity - Encourage continued efforts in weight management and exercise as tolerated  Lower Extremity Edema - improved with leg swelling  Follow-up - Schedule follow-up appointment in one year with either mr or Tessa.   Medication Adjustments/Labs and Tests Ordered: Current medicines are reviewed at length with the patient today.  Concerns regarding medicines are outlined above.  Orders Placed This Encounter  Procedures   Cardiac Stress Test: Informed Consent Details: Physician/Practitioner Attestation; Transcribe to consent form and obtain patient signature   MYOCARDIAL PERFUSION IMAGING   EKG 12-Lead   No orders of the defined types were placed in this encounter.   Patient Instructions  Medication Instructions:  Your physician recommends that you continue on your current medications as directed. Please refer to the Current Medication list given to you today.  *If you need a refill on your cardiac medications before your next appointment, please call your pharmacy*   Lab Work: NONE  If you have labs (blood work) drawn today and your tests are completely normal, you will receive  your results only by: MyChart Message (if you have MyChart) OR A paper copy in the mail If you have any lab test that is abnormal or we need to change your treatment, we will call you to review the results.   Testing/Procedures: Your physician has requested that you have a lexiscan myoview. For further information please visit [email or web address] Please follow instruction sheet, as given.     You are scheduled for a Myocardial Perfusion Imaging Study. Please arrive 15 minutes prior to your appointment time for registration and insurance purposes.   The test will take approximately 3 to 4 hours to complete; you may bring reading material.  If someone comes with you to your appointment, they will need to remain in the main lobby due to limited space in the testing area.    How to prepare for your Myocardial Perfusion Test: Do not eat or drink 3 hours prior to your test, except you may have water. Do not consume products containing caffeine (regular or decaffeinated) 12 hours prior to your test. (ex: coffee, chocolate, sodas, tea). Do bring a list of your current medications with you.  If not  listed below, you may take your medications as normal. Do wear comfortable clothes (no dresses or overalls) and walking shoes, tennis shoes preferred (No heels or open toe shoes are allowed). Do NOT wear cologne, perfume, aftershave, or lotions (deodorant is allowed). If these instructions are not followed, your test will have to be rescheduled.  If you cannot keep your appointment, please provide 24 hours notification to the Nuclear Lab, to avoid a possible $50 charge to your account.       Follow-Up: At [facility], you and your health needs are our priority.  As part of our continuing mission to provide you with exceptional heart care, we have created designated Provider Care Teams.  These Care Teams include your primary Cardiologist (physician) and Advanced Practice Providers (APPs -   Physician Assistants and Nurse Practitioners) who all work together to provide you with the care you need, when you need it.   Your next appointment:   1 year(s)  Provider:   Christell Constant, MD  or Jari Favre, PA-C     Signed, Christell Constant, MD  04/14/2023 12:13 PM    [facility]

## 2023-04-14 NOTE — Patient Instructions (Signed)
Medication Instructions:  Your physician recommends that you continue on your current medications as directed. Please refer to the Current Medication list given to you today.  *If you need a refill on your cardiac medications before your next appointment, please call your pharmacy*   Lab Work: NONE  If you have labs (blood work) drawn today and your tests are completely normal, you will receive your results only by: MyChart Message (if you have MyChart) OR A paper copy in the mail If you have any lab test that is abnormal or we need to change your treatment, we will call you to review the results.   Testing/Procedures: Your physician has requested that you have a lexiscan myoview. For further information please visit [email or web address] Please follow instruction sheet, as given.     You are scheduled for a Myocardial Perfusion Imaging Study. Please arrive 15 minutes prior to your appointment time for registration and insurance purposes.   The test will take approximately 3 to 4 hours to complete; you may bring reading material.  If someone comes with you to your appointment, they will need to remain in the main lobby due to limited space in the testing area.    How to prepare for your Myocardial Perfusion Test: Do not eat or drink 3 hours prior to your test, except you may have water. Do not consume products containing caffeine (regular or decaffeinated) 12 hours prior to your test. (ex: coffee, chocolate, sodas, tea). Do bring a list of your current medications with you.  If not listed below, you may take your medications as normal. Do wear comfortable clothes (no dresses or overalls) and walking shoes, tennis shoes preferred (No heels or open toe shoes are allowed). Do NOT wear cologne, perfume, aftershave, or lotions (deodorant is allowed). If these instructions are not followed, your test will have to be rescheduled.  If you cannot keep your appointment, please provide 24 hours  notification to the Nuclear Lab, to avoid a possible $50 charge to your account.       Follow-Up: At [facility], you and your health needs are our priority.  As part of our continuing mission to provide you with exceptional heart care, we have created designated Provider Care Teams.  These Care Teams include your primary Cardiologist (physician) and Advanced Practice Providers (APPs -  Physician Assistants and Nurse Practitioners) who all work together to provide you with the care you need, when you need it.   Your next appointment:   1 year(s)  Provider:   Christell Constant, MD  or Jari Favre, PA-C

## 2023-04-21 ENCOUNTER — Telehealth (HOSPITAL_COMMUNITY): Payer: Self-pay | Admitting: *Deleted

## 2023-04-21 ENCOUNTER — Telehealth (HOSPITAL_COMMUNITY): Payer: Self-pay | Admitting: Radiology

## 2023-04-21 NOTE — Telephone Encounter (Signed)
Left message on voicemail per DPR in reference to upcoming appointment scheduled on 06/18/22 with detailed instructions given per Myocardial Perfusion Study Information Sheet for the test. LM to arrive 15 minutes early, and that it is imperative to arrive on time for appointment to keep from having the test rescheduled. If you need to cancel or reschedule your appointment, please call the office within 24 hours of your appointment. Failure to do so may result in a cancellation of your appointment, and a $50 no show fee. Phone number given for call back for any questions. Tabitha Oneal

## 2023-04-21 NOTE — Telephone Encounter (Signed)
Patient given detailed instructions per Myocardial Perfusion Study Information Sheet for the test on 12/4 at 10:45. Patient notified to arrive 15 minutes early and that it is imperative to arrive on time for appointment to keep from having the test rescheduled.  If you need to cancel or reschedule your appointment, please call the office within 24 hours of your appointment. . Patient verbalized understanding.EHK

## 2023-04-29 ENCOUNTER — Ambulatory Visit (HOSPITAL_COMMUNITY): Payer: Medicare Other | Attending: Internal Medicine

## 2023-04-29 DIAGNOSIS — I10 Essential (primary) hypertension: Secondary | ICD-10-CM | POA: Insufficient documentation

## 2023-04-29 DIAGNOSIS — E1169 Type 2 diabetes mellitus with other specified complication: Secondary | ICD-10-CM | POA: Diagnosis present

## 2023-04-29 DIAGNOSIS — E785 Hyperlipidemia, unspecified: Secondary | ICD-10-CM | POA: Insufficient documentation

## 2023-04-29 DIAGNOSIS — R600 Localized edema: Secondary | ICD-10-CM | POA: Insufficient documentation

## 2023-04-29 DIAGNOSIS — Z01810 Encounter for preprocedural cardiovascular examination: Secondary | ICD-10-CM | POA: Insufficient documentation

## 2023-04-29 LAB — MYOCARDIAL PERFUSION IMAGING
LV dias vol: 72 mL (ref 46–106)
LV sys vol: 21 mL
Nuc Stress EF: 71 %
Peak HR: 125 {beats}/min
Rest HR: 79 {beats}/min
Rest Nuclear Isotope Dose: 10.4 mCi
SDS: 2
SRS: 0
SSS: 2
ST Depression (mm): 0 mm
Stress Nuclear Isotope Dose: 32.4 mCi
TID: 0.91

## 2023-04-29 MED ORDER — TECHNETIUM TC 99M TETROFOSMIN IV KIT
32.4000 | PACK | Freq: Once | INTRAVENOUS | Status: AC | PRN
  Administered 2023-04-29: 32.4 via INTRAVENOUS

## 2023-04-29 MED ORDER — TECHNETIUM TC 99M TETROFOSMIN IV KIT
10.4000 | PACK | Freq: Once | INTRAVENOUS | Status: AC | PRN
  Administered 2023-04-29: 10.4 via INTRAVENOUS

## 2023-04-29 MED ORDER — REGADENOSON 0.4 MG/5ML IV SOLN
0.4000 mg | Freq: Once | INTRAVENOUS | Status: AC
Start: 2023-04-29 — End: 2023-04-29
  Administered 2023-04-29: 0.4 mg via INTRAVENOUS

## 2023-05-04 ENCOUNTER — Other Ambulatory Visit: Payer: Self-pay | Admitting: Allergy & Immunology

## 2023-05-12 ENCOUNTER — Other Ambulatory Visit: Payer: Self-pay | Admitting: Allergy & Immunology

## 2023-05-12 ENCOUNTER — Encounter: Payer: Self-pay | Admitting: Allergy & Immunology

## 2023-05-12 ENCOUNTER — Other Ambulatory Visit: Payer: Self-pay

## 2023-05-12 ENCOUNTER — Ambulatory Visit (INDEPENDENT_AMBULATORY_CARE_PROVIDER_SITE_OTHER): Payer: Medicare Other | Admitting: Allergy & Immunology

## 2023-05-12 VITALS — BP 112/48 | HR 69 | Temp 97.2°F | Resp 12

## 2023-05-12 DIAGNOSIS — J3089 Other allergic rhinitis: Secondary | ICD-10-CM | POA: Diagnosis not present

## 2023-05-12 DIAGNOSIS — J302 Other seasonal allergic rhinitis: Secondary | ICD-10-CM

## 2023-05-12 DIAGNOSIS — J4540 Moderate persistent asthma, uncomplicated: Secondary | ICD-10-CM | POA: Diagnosis not present

## 2023-05-12 DIAGNOSIS — K219 Gastro-esophageal reflux disease without esophagitis: Secondary | ICD-10-CM | POA: Diagnosis not present

## 2023-05-12 MED ORDER — FLUTICASONE PROPIONATE 50 MCG/ACT NA SUSP: NASAL | 1 refills | Status: DC

## 2023-05-12 MED ORDER — MONTELUKAST SODIUM 10 MG PO TABS: ORAL_TABLET | ORAL | 1 refills | Status: DC

## 2023-05-12 MED ORDER — OMEPRAZOLE 40 MG PO CPDR: 40.0000 mg | DELAYED_RELEASE_CAPSULE | Freq: Every day | ORAL | 1 refills | Status: DC

## 2023-05-12 MED ORDER — IPRATROPIUM BROMIDE 0.03 % NA SOLN: 1.0000 | Freq: Four times a day (QID) | NASAL | 1 refills | Status: DC | PRN

## 2023-05-12 MED ORDER — FLUTICASONE FUROATE-VILANTEROL 100-25 MCG/ACT IN AEPB: 1.0000 | INHALATION_SPRAY | Freq: Every day | RESPIRATORY_TRACT | 5 refills | Status: DC

## 2023-05-12 MED ORDER — LEVOCETIRIZINE DIHYDROCHLORIDE 5 MG PO TABS: 5.0000 mg | ORAL_TABLET | Freq: Every evening | ORAL | 1 refills | Status: DC

## 2023-05-12 MED ORDER — AZELASTINE HCL 0.1 % NA SOLN: NASAL | 1 refills | Status: DC

## 2023-05-12 MED ORDER — ALBUTEROL SULFATE HFA 108 (90 BASE) MCG/ACT IN AERS: 2.0000 | INHALATION_SPRAY | Freq: Four times a day (QID) | RESPIRATORY_TRACT | 2 refills | Status: AC | PRN

## 2023-05-12 NOTE — Patient Instructions (Addendum)
1. Moderate persistent asthma, uncomplicated - Spirometry looks GREAT today with levels above 100%!  - You are doing so well with the current regimen. - Let's decrease Breo to the dose (instead of ).  - Daily controller medication(s): Breo 100/62mcg one puff once daily - Prior to physical activity: albuterol 2 puffs 10-15 minutes before physical activity. - Rescue medications: albuterol 4 puffs every 4-6 hours as needed or albuterol nebulizer one vial every 4-6 hours as needed - Asthma control goals:  * Full participation in all desired activities (may need albuterol before activity) * Albuterol use two time or less a week on average (not counting use with activity) * Cough interfering with sleep two time or less a month * Oral steroids no more than once a year * No hospitalizations  2. Chronic rhinitis - Continue with nasal ipratropium one spray per nostril every six hours AS NEEDED. - Continue with fluticasone nasal spray two sprays per nostril daily (AIM FOR THE EARS). - Continue with Singulair 10mg  daily.   3. GERD  - Continue omeprazole 40mg  daily.  4. Return in about 6 months (around 11/10/2023). You can have the follow up appointment with Dr. Dellis Anes or a Nurse Practicioner (our Nurse Practitioners are excellent and always have Physician oversight!).    Please inform us of any Emergency Department visits, hospitalizations, or changes in symptoms. Call us before going to the ED for breathing or allergy symptoms since we might be able to fit you in for a sick visit. Feel free to contact us anytime with any questions, problems, or concerns.  It was a pleasure to see you again today!  Websites that have reliable patient information: 1. American Academy of Asthma, Allergy, and Immunology: www.aaaai.org 2. Food Allergy Research and Education (FARE): foodallergy.org 3. Mothers of Asthmatics: http://www.asthmacommunitynetwork.org 4. American College of Allergy, Asthma,  and Immunology: www.acaai.org      "Like" [place] on Facebook and Instagram for our latest updates!      A healthy democracy works best when [employer] participate! Make sure you are registered to vote! If you have moved or changed any of your contact information, you will need to get this updated before voting! Scan the QR codes below to learn more!

## 2023-05-12 NOTE — Progress Notes (Signed)
FOLLOW UP  Date of Service/Encounter:  05/12/23   Assessment:   Moderate persistent asthma, uncomplicated    Seasonal and perennial allergic rhinitis (trees, mold, dust mite)   Gastroesophageal reflux disease - adding on a PPI   Anemia   Recurrent infections - most recent workup within normal limits  Plan/Recommendations:   1. Moderate persistent asthma, uncomplicated - Spirometry looks GREAT today with levels above 100%!  - You are doing so well with the current regimen. - Let's decrease Breo to the dose (instead of ).  - Daily controller medication(s): Breo 100/31mcg one puff once daily - Prior to physical activity: albuterol 2 puffs 10-15 minutes before physical activity. - Rescue medications: albuterol 4 puffs every 4-6 hours as needed or albuterol nebulizer one vial every 4-6 hours as needed - Asthma control goals:  * Full participation in all desired activities (may need albuterol before activity) * Albuterol use two time or less a week on average (not counting use with activity) * Cough interfering with sleep two time or less a month * Oral steroids no more than once a year * No hospitalizations  2. Chronic rhinitis - Continue with nasal ipratropium one spray per nostril every six hours AS NEEDED. - Continue with fluticasone nasal spray two sprays per nostril daily (AIM FOR THE EARS). - Continue with Singulair 10mg  daily.   3. GERD  - Continue omeprazole 40mg  daily.  4. Return in about 6 months (around 11/10/2023). You can have the follow up appointment with Dr. Dellis Anes or a Nurse Practicioner (our Nurse Practitioners are excellent and always have Physician oversight!).   Subjective:   Tabitha Oneal is a 67 y.o. 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 07/19/2019   Abnormal EKG 03/02/2019   Incomplete left bundle branch block (LBBB) 03/02/2019   Rebound headache 12/13/2018   Overuse of medication 12/13/2018   Intractable chronic migraine without aura and without status migrainosus 12/13/2018   Microcytic anemia 02/24/2018   High risk medication use 08/06/2017   Seasonal and perennial allergic rhinitis 07/30/2017   Recurrent infections 07/30/2017   Cough 06/08/2017   Moderate persistent asthma without complication 06/08/2017   Chronic constipation 02/04/2017   Hyperlipidemia due to type 2 diabetes mellitus (HCC) 02/04/2017   Hallux rigidus, left foot 01/01/2017   Postmenopausal symptoms 02/22/2016   Arthrofibrosis of knee joint 08/28/2015   Left hip pain 10/08/2014   Type 2 diabetes mellitus without complication, without long-term current use of insulin (HCC) 10/08/2014   Hyperlipidemia associated with type 2 diabetes mellitus (HCC) 10/08/2014   Primary hypertension 08/30/2014   Diabetes mellitus, type 2 (HCC) 08/30/2014   Diverticulosis of colon without hemorrhage 08/30/2014   Chronic low back pain 08/30/2014   Chronic pain syndrome 08/30/2014   Asthma in adult without complication 08/30/2014   Contracture of left knee, arthrofibrosis post op TKA 04/09/2012   Osteoarthritis of left knee 02/23/2012    History obtained from: chart review and patient.  Discussed the use of AI scribe software for clinical note transcription with the patient and/or guardian, who gave  verbal consent to proceed.  Tabitha Oneal is a 67 y.o. female presenting for a follow up visit.  She was last seen in May 2024.  At that time, spirometry looked great.  We continue with the Breo 200 mcg 1 puff once daily as well as albuterol as needed.  For her  rhinitis, we continue with Atrovent 1 spray per nostril every 6 hours as needed as well as Flonase 2 sprays per nostril daily and Singulair.  For her GERD, we continue with omeprazole 40 mg daily.  Since last visit, she has done very well.   Asthma/Respiratory Symptom History: Tabitha Oneal, a patient with a history of respiratory issues, presents for a follow-up visit. The patient reports that her breathing has improved significantly, with her respiratory measurements indicating over 100% capacity. She is currently on Breo, an inhaler medication, and is considering a decrease in dosage due to her improved condition.  Allergic Rhinitis Symptom History: In addition to her respiratory issues, the patient has been experiencing symptoms of a cold for the past month, including coughing and sneezing. She has been self-treating these symptoms with over-the-counter medication. She also reports occasional nosebleeds when blowing her nose, which she attributes to the dry air. Despite these symptoms, the patient reports feeling fine at the time of the visit. The patient also uses a nasal spray, Flonase, which she finds beneficial. She uses this spray along with another one, azelastine, although she reports that the latter tastes bad. She uses these nasal sprays up to four times a day, along with salt water rinses.  She recently underwent a surgical procedure, which was one of several she has had in the past.  She recently underwent a surgical procedure, which was one of several she has had in the past  Otherwise, there have been no changes to her past medical history, surgical history, family history, or social history.    Review of systems otherwise negative other than that mentioned in the HPI.    Objective:   Blood pressure (!) 112/48, pulse 69, temperature (!) 97.2 F (36.2 C), temperature source Temporal, resp. rate 12, SpO2 97%. There is no height or weight on file to calculate BMI.    Physical  Exam Vitals reviewed.  Constitutional:      Appearance: She is well-developed.     Comments: Pleasant. Cooperative with the exam.   HENT:     Head: Normocephalic and atraumatic.     Right Ear: Tympanic membrane, ear canal and external ear normal.     Left Ear: Tympanic membrane, ear canal and external ear normal.     Nose: No nasal deformity, septal deviation, mucosal edema or rhinorrhea.     Right Turbinates: Enlarged, swollen and pale.     Left Turbinates: Enlarged, swollen and pale.     Right Sinus: No maxillary sinus tenderness or frontal sinus tenderness.     Left Sinus: No maxillary sinus tenderness or frontal sinus tenderness.     Mouth/Throat:     Mouth: Mucous membranes are not pale and not dry.     Pharynx: Uvula midline.  Eyes:     General: Lids are normal. No allergic shiner.       Right eye: No discharge.        Left eye: No discharge.     Conjunctiva/sclera: Conjunctivae normal.     Right eye: Right conjunctiva is not injected. No chemosis.    Left eye: Left conjunctiva is not injected. No chemosis.    Pupils:  Pupils are equal, round, and reactive to light.  Cardiovascular:     Rate and Rhythm: Normal rate and regular rhythm.     Heart sounds: Normal heart sounds.  Pulmonary:     Effort: Pulmonary effort is normal. No tachypnea, accessory muscle usage or respiratory distress.     Breath sounds: Normal breath sounds. No wheezing, rhonchi or rales.  Chest:     Chest wall: No tenderness.  Lymphadenopathy:     Cervical: No cervical adenopathy.  Skin:    Coloration: Skin is not pale.     Findings: No abrasion, erythema, petechiae or rash. Rash is not papular, urticarial or vesicular.  Neurological:     Mental Status: She is alert.  Psychiatric:        Behavior: Behavior is cooperative.      Diagnostic studies:    Spirometry: results normal (FEV1: 1.82/103%, FVC: 2.44/109%, FEV1/FVC: 75%).    Spirometry consistent with normal pattern.   Allergy Studies:  none       Malachi Bonds, MD  [facility]

## 2023-06-15 ENCOUNTER — Telehealth: Payer: Self-pay | Admitting: Allergy & Immunology

## 2023-06-15 NOTE — Telephone Encounter (Signed)
Patient called stating she is having some wheezing,coughing, and congestion. Patient is requesting some medication for these issues to be sent to [facility] on [address].

## 2023-06-15 NOTE — Telephone Encounter (Signed)
Called patient - DOB/NEED DPR - LMOVM to contact the office to schedule an office visit due to the symptoms she is having.  If/When patient call back- please advise her of the above notation.

## 2023-07-13 ENCOUNTER — Ambulatory Visit: Payer: Medicare Other | Admitting: Family Medicine

## 2023-08-17 NOTE — Progress Notes (Unsigned)
 PATIENT: Tabitha Oneal DOB: 1956-05-15  REASON FOR VISIT: follow up HISTORY FROM: patient  No chief complaint on file.    Ahern: migraines Athar: sleep  HISTORY OF PRESENT ILLNESS:  08/17/23 ALL:  [name] returns for follow up for migraines and OSA on CPAP. She was last seen 06/2022 and doing well on topiramate, tizanidine and CPAP. Since,     07/10/2022 ALL:  [name] returns for follow up for migraines and OSA on CPAP. She was last seen 02/2022 and having more headaches. We encouraged her to resume CPAP use. She continues topiramate 100mg  QD and tizanidine as needed. She has done very well. She is now using CPAP every night for about 8 hours. She reports headaches are nearly resolved. She does use tizanidine a couple times a month. She feels better on CPAP therapy.     03/06/2022 ALL: [name] returns for follow up for migraines and OSA on CPAP. She admits that she has not used CPAP consistently over the past few months. She has been under more stress. She denies concerns with machine or supplies. She does not benefit when using therapy. She reports headaches are worse since not using CPAP. She continues topiramate 100mg  daily and tizanidine as needed. She is having near daily headaches.     03/06/2021 ALL: [name] returns for follow up for migraines and OSA on CPAP. She continues topiramate 100mg  at bedtime. She is doing well. Rare headaches, usually if she skips meals. Tizanidine 4mg  as needed helps with abortive therapy. She is followed regularly by PCP.     08/24/2019 ALL:  [name] is a 68 y.o. female here today for follow up for migraines. She was started on topiramate 100mg  at bedtime in 11/2018. Tizanidine was given for PRN use. She feels that headaches have improved. She was having headaches daily but now feels they occur 2-3 times a week. Tizanidine helps. Headaches can be present in the morning or occur throughout the day. She does have a headache  today but blames this on not eating breakfast. She does snore. She wakes herself up at night snoring. She lives alone but reports that when her husband was alive he would fuss at her for snoring. She wakes multiple times at night. She thought that it was normal and has not paid much attention to how she feels in the mornings. She does not usually feel excessively tired throughout the day. Her daughter has sleep apnea treated with CPAP therpay.    HISTORY: (copied from Dr Trevor Mace note on 12/13/2018)  HPI:  Tabitha Oneal is a 68 y.o. female here as requested by Sharon Seller, NP for headaches.  She has a past medical history of migraines, hypertension, asthma, diabetes, hyperlipidemia, osteoarthritis, contracture of left knee, chronic low back pain, chronic pain syndrome, high risk medication use. She takes excedrin 2x a day. She also takes the "PM kond"  The headaches are more in the front behind both eyes. It is throbbing. It hurts. She has daily headaches. She only took the Topiramate for a few days and then stopped. She has nausea. She has to lay down, throbbing. Sleeping helps. Light sensitivity. The headaches get better with medicaine but it comes back when the medicatuon wears off. She denies vision changes, numbness, weakness, speech or gait new difficulties. It hurts to bend over, the headaches will wake her up. She does not know if she snores and can;t tell me a lot about her sleeping.  No other focal  neurologic deficits, associated symptoms, inciting events or modifiable factors.   Migraine medications tried include: Topamax, baclofen, Excedrin, Tylenol, Zofran, Accupril (ACE inhibitors can be used as migraine prevention), Phenergan   Reviewed notes, labs and imaging from outside physicians, which showed:   I reviewed notes from Abbey Chatters.  Patient has a history of migraines.  Patient reports she often wakes up in the morning with a headache.  She was tried on several migraine  medications and it has not helped.  Baclofen has not helped either.  She takes Excedrin Migraine often.  She denies neck tension.  Reports the head pain is in the front near her eyes.  She has blurry/cloudy vision even with her glasses.  Her last eye exam was at least a year ago or maybe more.  She has uncontrolled blood pressure in the office it was 168/97.  She does eat mostly canned foods at home.  She is on Topamax 25 mg twice daily.   REVIEW OF SYSTEMS: Out of a complete 14 system review of symptoms, the patient complains only of the following symptoms, headaches, snoring, knee pain and all other reviewed systems are negative.  ESS: 1/24  ALLERGIES: No Known Allergies  HOME MEDICATIONS: Outpatient Medications Prior to Visit  Medication Sig Dispense Refill   albuterol (VENTOLIN HFA) 108 (90 Base) MCG/ACT inhaler Inhale 2 puffs into the lungs every 6 (six) hours as needed for wheezing or shortness of breath. 8 g 2   amLODipine (NORVASC) 10 MG tablet Take 1 tablet (10 mg total) by mouth daily. 90 tablet 1   aspirin EC 81 MG tablet Take 1 tablet (81 mg total) by mouth daily. Swallow whole. 90 tablet 3   azelastine (ASTELIN) 0.1 % nasal spray USE 1 SPRAY EACH NOSTRIL TWICE DAILY 90 mL 1   Bepotastine Besilate 1.5 % SOLN PLACE 1 DROP INTO BOTH EYES IN THE MORNING AND AT BEDTIME. 10 mL 5   Calcium Carb-Cholecalciferol (CALCIUM 600 + D PO) Take by mouth.     celecoxib (CELEBREX) 200 MG capsule Take 200 mg by mouth 2 (two) times daily as needed.     diclofenac Sodium (VOLTAREN) 1 % GEL 3 g 3 (three) times daily.     fluticasone (FLONASE) 50 MCG/ACT nasal spray SPRAY 2 SPRAYS INTO EACH NOSTRIL EVERY DAY 48 mL 1   gabapentin (NEURONTIN) 100 MG capsule TAKE 1 CAPSULE (100 MG) BY ORAL ROUTE 3 TIMES PER DAY FOR 90 DAYS 270 capsule 3   hydrochlorothiazide (MICROZIDE) 12.5 MG capsule TAKE 1 CAPSULE BY MOUTH EVERY DAY 90 capsule 3   ipratropium (ATROVENT) 0.03 % nasal spray Place 1 spray into both  nostrils every 6 (six) hours as needed. 90 mL 1   JANUVIA 100 MG tablet TAKE 1 TABLET BY MOUTH EVERY DAY 90 tablet 1   levocetirizine (XYZAL) 5 MG tablet Take 1 tablet (5 mg total) by mouth every evening. 90 tablet 1   losartan (COZAAR) 50 MG tablet Take 50 mg by mouth daily.     metFORMIN (GLUCOPHAGE) 500 MG tablet Take 500 mg by mouth 2 (two) times daily.     montelukast (SINGULAIR) 10 MG tablet TAKE 1 TABLET BY MOUTH EVERY DAY IN THE EVENING 90 tablet 1   omeprazole (PRILOSEC) 40 MG capsule Take 1 capsule (40 mg total) by mouth daily. 90 capsule 1   ONE TOUCH ULTRA TEST test strip Check blood sugar once daily as directed  3   rosuvastatin (CRESTOR) 20 MG tablet Take 20  mg by mouth daily.     SYMBICORT 160-4.5 MCG/ACT inhaler Inhale 2 puffs into the lungs 2 (two) times daily. 10.2 g 1   tiZANidine (ZANAFLEX) 4 MG tablet Take 1 tablet (4 mg total) by mouth every 6 (six) hours as needed (as needed for migraines). 30 tablet 0   topiramate (TOPAMAX) 100 MG tablet TAKE 1 TABLET BY MOUTH EVERYDAY AT BEDTIME 90 tablet 3   No facility-administered medications prior to visit.    PAST MEDICAL HISTORY: Past Medical History:  Diagnosis Date   Asthma    QVAR daily and Albuterol as needed   Bladder infection    taking Keflex daily    Chronic back pain    reason unknown   Diabetes mellitus    takes Metformin and Tradjenta daily   GERD (gastroesophageal reflux disease)    takes Omeprazole daily   Hard of hearing    Headache    daily.Takes Excedrine daily   Hepatitis C    History of blood transfusion    no abnormal reaction noted   Hyperlipidemia    takes Atorvastatin daily   Hypertension    takes Quinapril daily   Joint pain    Joint swelling    Nocturia    Obesity    Osteoarthritis of left knee 02/23/2012   Seasonal allergies    takes Claritin and Singulair daily.Nasal spray as needed   Shortness of breath dyspnea    with exertion    PAST SURGICAL HISTORY: Past Surgical History:   Procedure Laterality Date   ABDOMINAL HYSTERECTOMY     EYE SURGERY  2021   INJECTION KNEE  04/09/2012   Procedure: KNEE INJECTION;  Surgeon: Eulas Post, MD;  Location: [facility];  Service: Orthopedics;  Laterality: Left;   JOINT REPLACEMENT     acl   bil knees   KNEE ARTHROTOMY Left 08/28/2015   Procedure: LEFT KNEE ARTHROFIBROSIS EXCISION; pylectomy;  Surgeon: Cammy Copa, MD;  Location: [facility];  Service: Orthopedics;  Laterality: Left;   KNEE CLOSED REDUCTION  04/09/2012   Procedure: CLOSED MANIPULATION KNEE;  Surgeon: Eulas Post, MD;  Location: [facility];  Service: Orthopedics;  Laterality: Left;  Manipulation Knee with Anesthesia includes Application of Traction    NERVE SURGERY Left 11/06/2020   Per patient on left knee   TOTAL KNEE ARTHROPLASTY  02/23/2012   Procedure: TOTAL KNEE ARTHROPLASTY;  Surgeon: Eulas Post, MD;  Location: [facility];  Service: Orthopedics;  Laterality: Left;    FAMILY HISTORY: Family History  Problem Relation Age of Onset   Bronchitis Father 54   Alzheimer's disease Mother    Ovarian cancer Mother    Stomach cancer Mother    High blood pressure Sister    High blood pressure Sister    Headache Sister        pt thinks    High blood pressure Sister    High blood pressure Sister        pt thinks   Breast cancer Sister    High blood pressure Sister    High blood pressure Sister    Headache Daughter    Diabetes Daughter    Diabetes Daughter    Diabetes Other    Colon cancer Neg Hx    Esophageal cancer Neg Hx    Rectal cancer Neg Hx     SOCIAL HISTORY: Social History   Socioeconomic History   Marital status: Widowed    Spouse name: Not  on file   Number of children: 4   Years of education: 14   Highest education level: Some college, no degree  Occupational History   Not on file  Tobacco Use   Smoking status: Never   Smokeless tobacco: Never  Vaping Use   Vaping status: Never Used   Substance and Sexual Activity   Alcohol use: No    Alcohol/week: 0.0 standard drinks of alcohol   Drug use: No   Sexual activity: Yes    Birth control/protection: Surgical  Other Topics Concern   Not on file  Social History Narrative   Diet: none   Do you drink/eat things with caffeine ? Coffee    Material status: widow     What year were you married? 08/01/1978   Do you live in a house, apartment, assisted living,condo, trailer,ect.)? Townhouse (temporary)    Is it one or more stories? Yes   How many persons live in your home? Two   Do you have any pets in your home ? Yes, Shih-tzu   Current or past profession: none   Do you exercise? No  Type & how often: no   Do you have a living will ? No   Do you have a DNR form? No   If not, do you want to discuss one?  Not now   Do you have signed POA /HPOA forms? No    If so, please bring to your appointment.            Update 12/13/2018: pt states she doesn't drink much coffee, tea   She lives alone                     Social Drivers of Health   Financial Resource Strain: Medium Risk (02/11/2018)   Overall Financial Resource Strain (CARDIA)    Difficulty of Paying Living Expenses: Somewhat hard  Food Insecurity: Food Insecurity Present (02/11/2018)   Hunger Vital Sign    Worried About Running Out of Food in the Last Year: Sometimes true    Ran Out of Food in the Last Year: Sometimes true  Transportation Needs: No Transportation Needs (02/11/2018)   PRAPARE - Administrator, Civil Service (Medical): No    Lack of Transportation (Non-Medical): No  Physical Activity: Sufficiently Active (02/11/2018)   Exercise Vital Sign    Days of Exercise per Week: 7 days    Minutes of Exercise per Session: 30 min  Stress: Stress Concern Present (02/11/2018)   [employer] of Occupational Health - Occupational Stress Questionnaire    Feeling of Stress : To some extent  Social Connections: Somewhat Isolated (02/11/2018)   Social  Connection and Isolation Panel [NHANES]    Frequency of Communication with Friends and Family: Twice a week    Frequency of Social Gatherings with Friends and Family: Once a week    Attends Religious Services: More than 4 times per year    Active Member of [employer] or Organizations: No    Attends Banker Meetings: Never    Marital Status: Widowed  Intimate Partner Violence: Not At Risk (02/11/2018)   Humiliation, Afraid, Rape, and Kick questionnaire    Fear of Current or Ex-Partner: No    Emotionally Abused: No    Physically Abused: No    Sexually Abused: No      PHYSICAL EXAM  There were no vitals filed for this visit.     There is no height or weight on  file to calculate BMI.  Generalized: Well developed, in no acute distress  Cardiology: normal rate and rhythm, no murmur noted Neurological examination  Mentation: Alert oriented to time, place, history taking. Follows all commands speech and language fluent Cranial nerve II-XII: Pupils were equal round reactive to light. Extraocular movements were full, visual field were full on confrontational test. Facial sensation and strength were normal. Uvula tongue midline. Head turning and shoulder shrug  were normal and symmetric. Motor: The motor testing reveals 5 over 5 strength of all 4 extremities. Good symmetric motor tone is noted throughout.  Gait and station: has left limp due to left knee brace, uses single prong cane for stability   DIAGNOSTIC DATA (LABS, IMAGING, TESTING) - I reviewed patient records, labs, notes, testing and imaging myself where available.     05/18/2019    9:56 AM  MMSE - Mini Mental State Exam  Orientation to time 5  Orientation to Place 5  Registration 3  Attention/ Calculation 5  Recall 3  Language- name 2 objects 2  Language- repeat 1  Language- follow 3 step command 3  Language- read & follow direction 1  Write a sentence 1  Copy design 1  Total score 30     Lab Results   Component Value Date   WBC 9.1 07/09/2021   HGB 12.3 07/09/2021   HCT 38.9 07/09/2021   MCV 80 07/09/2021   PLT 330 11/12/2020      Component Value Date/Time   NA 139 03/28/2022 1059   K 3.8 03/28/2022 1059   CL 101 03/28/2022 1059   CO2 24 03/28/2022 1059   GLUCOSE 158 (H) 03/28/2022 1059   GLUCOSE 122 (H) 11/12/2020 1102   BUN 11 03/28/2022 1059   CREATININE 0.94 03/28/2022 1059   CREATININE 0.80 11/12/2020 1102   CALCIUM 10.0 03/28/2022 1059   PROT 8.0 11/12/2020 1102   PROT 7.5 11/21/2015 1026   ALBUMIN 3.8 02/08/2019 1752   ALBUMIN 4.4 11/21/2015 1026   AST 11 11/12/2020 1102   ALT 10 11/12/2020 1102   ALKPHOS 176 (H) 02/08/2019 1752   BILITOT 0.4 11/12/2020 1102   BILITOT 0.3 11/21/2015 1026   GFRNONAA 77 11/12/2020 1102   GFRAA 90 11/12/2020 1102   Lab Results  Component Value Date   CHOL 140 08/10/2020   HDL 60 08/10/2020   LDLCALC 65 08/10/2020   TRIG 66 08/10/2020   CHOLHDL 2.3 08/10/2020   Lab Results  Component Value Date   HGBA1C 6.7 (H) 11/12/2020   No results found for: "VITAMINB12" Lab Results  Component Value Date   TSH 0.517 12/13/2018       ASSESSMENT AND PLAN 68 y.o. year old female  has a past medical history of Asthma, Bladder infection, Chronic back pain, Diabetes mellitus, GERD (gastroesophageal reflux disease), Hard of hearing, Headache, Hepatitis C, History of blood transfusion, Hyperlipidemia, Hypertension, Joint pain, Joint swelling, Nocturia, Obesity, Osteoarthritis of left knee (02/23/2012), Seasonal allergies, and Shortness of breath dyspnea. here with   No diagnosis found.   Mrs Parrales is doing well, today. She will continue topiramate 100mg  daily and tizanidine 4mg  as needed. May consider weaning meds in future if headaches stay well controlled. She was encouraged to continue CPAP nightly for at least 4 hours. Healthy lifestyle habits encouraged. She will follow up in 1 year. She verbalizes understanding and agreement with  this plan.    No orders of the defined types were placed in this encounter.     No  orders of the defined types were placed in this encounter.      Shawnie Dapper, FNP-C 08/17/2023, 9:44 AM [facility] [address], [address] [place], [place] [address] [phone]

## 2023-08-17 NOTE — Patient Instructions (Incomplete)
 Below is our plan:  We will continue topiramate and tizanidine as prescribed.   Please continue using your CPAP regularly. While your insurance requires that you use CPAP at least 4 hours each night on 70% of the nights, I recommend, that you not skip any nights and use it throughout the night if you can. Getting used to CPAP and staying with the treatment long term does take time and patience and discipline. Untreated obstructive sleep apnea when it is moderate to severe can have an adverse impact on cardiovascular health and raise her risk for heart disease, arrhythmias, hypertension, congestive heart failure, stroke and diabetes. Untreated obstructive sleep apnea causes sleep disruption, nonrestorative sleep, and sleep deprivation. This can have an impact on your day to day functioning and cause daytime sleepiness and impairment of cognitive function, memory loss, mood disturbance, and problems focussing. Using CPAP regularly can improve these symptoms.  We will update supply orders, today.   Please make sure you are staying well hydrated. I recommend 50-60 ounces daily. Well balanced diet and regular exercise encouraged. Consistent sleep schedule with 6-8 hours recommended.   Please continue follow up with care team as directed.   Follow up with me in 1 year   You may receive a survey regarding today's visit. I encourage you to leave honest feed back as I do use this information to improve patient care. Thank you for seeing me today!

## 2023-08-18 NOTE — Progress Notes (Unsigned)
 Marland Kitchen

## 2023-08-19 ENCOUNTER — Encounter: Payer: Self-pay | Admitting: Family Medicine

## 2023-08-19 ENCOUNTER — Ambulatory Visit: Payer: Medicare Other | Admitting: Family Medicine

## 2023-08-19 VITALS — BP 132/80 | HR 75 | Ht 62.0 in | Wt 203.0 lb

## 2023-08-19 DIAGNOSIS — G4733 Obstructive sleep apnea (adult) (pediatric): Secondary | ICD-10-CM

## 2023-08-19 DIAGNOSIS — G43709 Chronic migraine without aura, not intractable, without status migrainosus: Secondary | ICD-10-CM

## 2023-08-19 MED ORDER — TOPIRAMATE 100 MG PO TABS: ORAL_TABLET | ORAL | 3 refills | Status: AC

## 2023-08-19 MED ORDER — TIZANIDINE HCL 4 MG PO TABS: 4.0000 mg | ORAL_TABLET | Freq: Four times a day (QID) | ORAL | 0 refills | Status: AC | PRN

## 2023-09-02 ENCOUNTER — Other Ambulatory Visit: Payer: Self-pay | Admitting: Family

## 2023-09-02 DIAGNOSIS — Z13820 Encounter for screening for osteoporosis: Secondary | ICD-10-CM

## 2023-10-22 ENCOUNTER — Telehealth: Payer: Self-pay | Admitting: Family Medicine

## 2023-10-22 NOTE — Telephone Encounter (Signed)
 Will you guys please attach 90 day download for review of compliance? TY!

## 2023-10-22 NOTE — Telephone Encounter (Signed)
 Tabitha Oneal

## 2023-10-23 ENCOUNTER — Other Ambulatory Visit: Payer: Self-pay | Admitting: Allergy & Immunology

## 2023-11-02 ENCOUNTER — Ambulatory Visit: Admitting: Physical Therapy

## 2023-11-03 ENCOUNTER — Other Ambulatory Visit: Payer: Self-pay | Admitting: Allergy & Immunology

## 2023-11-10 ENCOUNTER — Ambulatory Visit (INDEPENDENT_AMBULATORY_CARE_PROVIDER_SITE_OTHER): Payer: Medicare Other | Admitting: Allergy & Immunology

## 2023-11-10 ENCOUNTER — Encounter: Payer: Self-pay | Admitting: Allergy & Immunology

## 2023-11-10 ENCOUNTER — Other Ambulatory Visit: Payer: Self-pay

## 2023-11-10 VITALS — BP 130/68 | HR 90 | Temp 98.0°F | Resp 16 | Ht 62.5 in | Wt 205.5 lb

## 2023-11-10 DIAGNOSIS — J4540 Moderate persistent asthma, uncomplicated: Secondary | ICD-10-CM | POA: Diagnosis not present

## 2023-11-10 DIAGNOSIS — J302 Other seasonal allergic rhinitis: Secondary | ICD-10-CM

## 2023-11-10 DIAGNOSIS — K219 Gastro-esophageal reflux disease without esophagitis: Secondary | ICD-10-CM | POA: Diagnosis not present

## 2023-11-10 DIAGNOSIS — J3089 Other allergic rhinitis: Secondary | ICD-10-CM

## 2023-11-10 MED ORDER — CARBINOXAMINE MALEATE 4 MG PO TABS: 4.0000 mg | ORAL_TABLET | Freq: Two times a day (BID) | ORAL | 3 refills | Status: DC

## 2023-11-10 NOTE — Patient Instructions (Addendum)
 1. Moderate persistent asthma, uncomplicated - Lung testing looks amazing today (well over 100%).  - We are not going to make any changes today at all. - Daily controller medication(s): Breo 100/25mcg one puff once daily - Prior to physical activity: albuterol  2 puffs 10-15 minutes before physical activity. - Rescue medications: albuterol  4 puffs every 4-6 hours as needed or albuterol  nebulizer one vial every 4-6 hours as needed - Asthma control goals:  * Full participation in all desired activities (may need albuterol  before activity) * Albuterol  use two time or less a week on average (not counting use with activity) * Cough interfering with sleep two time or less a month * Oral steroids no more than once a year * No hospitalizations  2. Chronic rhinitis - Continue with nasal ipratropium one spray per nostril every six hours AS NEEDED. - Continue with fluticasone  nasal spray two sprays per nostril daily (AIM FOR THE EARS). - Continue with Singulair  (montelukast ) 10mg  daily.  - Add a carbinoxamine 4mg  TWICE daily to help with symptoms.    3. GERD  - Continue omeprazole  40mg  daily.  4. Return in about 6 months (around 05/11/2024). You can have the follow up appointment with Dr. Idolina Maker or a Nurse Practicioner (our Nurse Practitioners are excellent and always have Physician oversight!).    Please inform us  of any Emergency Department visits, hospitalizations, or changes in symptoms. Call us  before going to the ED for breathing or allergy symptoms since we might be able to fit you in for a sick visit. Feel free to contact us  anytime with any questions, problems, or concerns.  It was a pleasure to see you again today!  Websites that have reliable patient information: 1. American Academy of Asthma, Allergy, and Immunology: www.aaaai.org 2. Food Allergy Research and Education (FARE): foodallergy.org 3. Mothers of Asthmatics: http://www.asthmacommunitynetwork.org 4. American College of  Allergy, Asthma, and Immunology: www.acaai.org      "Like" us  on Facebook and Instagram for our latest updates!      A healthy democracy works best when [employer] participate! Make sure you are registered to vote! If you have moved or changed any of your contact information, you will need to get this updated before voting! Scan the QR codes below to learn more!

## 2023-11-10 NOTE — Progress Notes (Signed)
 FOLLOW UP  Date of Service/Encounter:  11/10/23   Assessment:   Moderate persistent asthma, uncomplicated    Seasonal and perennial allergic rhinitis (trees, mold, dust mite)   Gastroesophageal reflux disease - doing better with PPI Anemia   Recurrent infections - most recent workup within normal limits  Plan/Recommendations:   1. Moderate persistent asthma, uncomplicated - Lung testing looks amazing today (well over 100%).  - We are not going to make any changes today at all. - Daily controller medication(s): Breo 100/25mcg one puff once daily - Prior to physical activity: albuterol  2 puffs 10-15 minutes before physical activity. - Rescue medications: albuterol  4 puffs every 4-6 hours as needed or albuterol  nebulizer one vial every 4-6 hours as needed - Asthma control goals:  * Full participation in all desired activities (may need albuterol  before activity) * Albuterol  use two time or less a week on average (not counting use with activity) * Cough interfering with sleep two time or less a month * Oral steroids no more than once a year * No hospitalizations  2. Chronic rhinitis - Continue with nasal ipratropium one spray per nostril every six hours AS NEEDED. - Continue with fluticasone  nasal spray two sprays per nostril daily (AIM FOR THE EARS). - Continue with Singulair  (montelukast ) 10mg  daily.  - Add a carbinoxamine 4mg  TWICE daily to help with symptoms.    3. GERD  - Continue omeprazole  40mg  daily.  4. Return in about 6 months (around 05/11/2024). You can have the follow up appointment with Dr. Idolina Maker or a Nurse Practicioner (our Nurse Practitioners are excellent and always have Physician oversight!).   Subjective:   Tabitha Oneal is a 68 y.o. 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 07/19/2019   Abnormal EKG 03/02/2019   Incomplete left bundle branch block (LBBB) 03/02/2019   Rebound headache 12/13/2018   Overuse of medication 12/13/2018   Intractable chronic migraine without aura and without status migrainosus 12/13/2018   Microcytic anemia 02/24/2018   High risk medication use 08/06/2017   Seasonal and perennial allergic rhinitis 07/30/2017   Recurrent infections 07/30/2017   Cough 06/08/2017   Moderate persistent asthma without complication 06/08/2017   Chronic constipation 02/04/2017   Hyperlipidemia due to type 2 diabetes mellitus (HCC) 02/04/2017   Hallux rigidus, left foot 01/01/2017   Postmenopausal symptoms 02/22/2016   Arthrofibrosis of knee joint 08/28/2015   Left hip pain 10/08/2014   Type 2 diabetes mellitus without complication, without long-term current use of insulin  (HCC) 10/08/2014   Hyperlipidemia associated with type 2 diabetes mellitus (HCC) 10/08/2014   Primary hypertension 08/30/2014   Diabetes mellitus, type 2 (HCC) 08/30/2014   Diverticulosis of colon without hemorrhage 08/30/2014   Chronic low back pain 08/30/2014   Chronic pain syndrome 08/30/2014   Asthma in adult without complication 08/30/2014   Contracture of left knee, arthrofibrosis post op TKA 04/09/2012   Osteoarthritis of left knee 02/23/2012    History obtained from: chart review and patient.  Discussed the use of AI scribe  software for clinical note transcription with the patient and/or guardian, who gave verbal consent to proceed.  Tabitha Oneal is a 68 y.o. female presenting for a follow up visit.  She was last seen in December 2024.  At that time, we continued her with [facility] but decreased her dose to 100 mcg 1 puff once  daily.  She had been on the 200 mcg dose for years.  For her rhinitis, we continue with ipratropium and Flonase .  We also continue with Singulair .  Her reflux was controlled with omeprazole .  Since last visit, she has done well.  Asthma/Respiratory Symptom History: She has asthma and uses Breo, one puff daily, which she finds effective despite its cost. She occasionally experiences shortness of breath and wheezing, with a recent episode a few days ago. These symptoms are transient. Jamayia's asthma has been well controlled. She has not required rescue medication, experienced nocturnal awakenings due to lower respiratory symptoms, nor have activities of daily living been limited. She has required no Emergency Department or Urgent Care visits for her asthma. She has required zero courses of systemic steroids for asthma exacerbations since the last visit. ACT score today is 25, indicating excellent asthma symptom control.   Allergic Rhinitis Symptom History: She experiences ongoing nasal congestion and a runny nose daily, requiring frequent wiping. Occasionally, she has minor epistaxis when blowing her nose. Nasal sprays provide some relief. She has had testing in the distant past that was negative.   No rashes, sinus infections, or pneumonia. Occasional wheezing and shortness of breath, but no persistent symptoms.   She is undergoing physical therapy for her knee, planned for six to eight weeks, starting last Friday. There is ongoing discussion about potential knee surgery, which she is hesitant to pursue. She has had both knees replaced in the past and is not using a knee brace as advised.  Otherwise, there have been no changes to her past medical history, surgical history, family history, or social history.    Review of systems otherwise negative other than that mentioned in the HPI.    Objective:   Blood pressure 130/68, pulse 90, temperature 98 F (36.7 C), temperature source Temporal,  resp. rate 16, height 5' 2.5 (1.588 m), weight 205 lb 8 oz (93.2 kg), SpO2 97%. Body mass index is 36.99 kg/m.    Physical Exam Vitals reviewed.  Constitutional:      Appearance: She is well-developed.     Comments: Pleasant. Cooperative with the exam.   HENT:     Head: Normocephalic and atraumatic.     Right Ear: Tympanic membrane, ear canal and external ear normal.     Left Ear: Tympanic membrane, ear canal and external ear normal.     Nose: No nasal deformity, septal deviation, mucosal edema or rhinorrhea.     Right Turbinates: Enlarged, swollen and pale.     Left Turbinates: Enlarged, swollen and pale.     Right Sinus: No maxillary sinus tenderness or frontal sinus tenderness.     Left Sinus: No maxillary sinus tenderness or frontal sinus tenderness.     Comments: No polyps noted.     Mouth/Throat:     Lips: Pink.     Mouth: Mucous membranes are moist. Mucous membranes are not pale and not dry.     Pharynx: Uvula midline.   Eyes:     General: Lids are normal. No allergic shiner.       Right eye: No discharge.        Left  eye: No discharge.     Conjunctiva/sclera: Conjunctivae normal.     Right eye: Right conjunctiva is not injected. No chemosis.    Left eye: Left conjunctiva is not injected. No chemosis.    Pupils: Pupils are equal, round, and reactive to light.    Cardiovascular:     Rate and Rhythm: Normal rate and regular rhythm.     Heart sounds: Normal heart sounds.  Pulmonary:     Effort: Pulmonary effort is normal. No tachypnea, accessory muscle usage or respiratory distress.     Breath sounds: Normal breath sounds. No wheezing, rhonchi or rales.     Comments: Moving air well in all lung fields.  Chest:     Chest wall: No tenderness.  Lymphadenopathy:     Cervical: No cervical adenopathy.   Skin:    Coloration: Skin is not pale.     Findings: No abrasion, erythema, petechiae or rash. Rash is not papular, urticarial or vesicular.   Neurological:      Mental Status: She is alert.   Psychiatric:        Behavior: Behavior is cooperative.      Diagnostic studies:    Spirometry: results normal (FEV1: 1.84/106%, FVC: 2.44/110%, FEV1/FVC: 75%).    Spirometry consistent with normal pattern.   Allergy Studies: none   Drexel Gentles, MD  [facility] 

## 2023-11-20 ENCOUNTER — Other Ambulatory Visit: Payer: Self-pay | Admitting: Allergy & Immunology

## 2023-12-02 ENCOUNTER — Other Ambulatory Visit: Payer: Self-pay | Admitting: Allergy & Immunology

## 2023-12-12 ENCOUNTER — Other Ambulatory Visit: Payer: Self-pay | Admitting: Allergy & Immunology

## 2024-01-18 ENCOUNTER — Other Ambulatory Visit: Payer: Self-pay

## 2024-01-18 ENCOUNTER — Other Ambulatory Visit: Payer: Self-pay | Admitting: Allergy & Immunology

## 2024-01-18 MED ORDER — HYDROCHLOROTHIAZIDE 12.5 MG PO CAPS: 12.5000 mg | ORAL_CAPSULE | Freq: Every day | ORAL | 0 refills | Status: DC

## 2024-01-19 ENCOUNTER — Other Ambulatory Visit: Payer: Self-pay | Admitting: Allergy & Immunology

## 2024-03-01 ENCOUNTER — Telehealth: Payer: Self-pay | Admitting: Family Medicine

## 2024-03-01 NOTE — Telephone Encounter (Signed)
 MYC cxl

## 2024-04-01 ENCOUNTER — Emergency Department (HOSPITAL_COMMUNITY)

## 2024-04-01 ENCOUNTER — Other Ambulatory Visit: Payer: Self-pay

## 2024-04-01 ENCOUNTER — Emergency Department (HOSPITAL_COMMUNITY)
Admission: EM | Admit: 2024-04-01 | Discharge: 2024-04-01 | Disposition: A | Discharge status: Home / Self Care | Attending: Emergency Medicine | Admitting: Emergency Medicine

## 2024-04-01 ENCOUNTER — Encounter (HOSPITAL_COMMUNITY): Payer: Self-pay | Admitting: *Deleted

## 2024-04-01 DIAGNOSIS — Z7982 Long term (current) use of aspirin: Secondary | ICD-10-CM | POA: Insufficient documentation

## 2024-04-01 DIAGNOSIS — M545 Low back pain, unspecified: Secondary | ICD-10-CM | POA: Insufficient documentation

## 2024-04-01 DIAGNOSIS — E119 Type 2 diabetes mellitus without complications: Secondary | ICD-10-CM | POA: Diagnosis not present

## 2024-04-01 DIAGNOSIS — Z7984 Long term (current) use of oral hypoglycemic drugs: Secondary | ICD-10-CM | POA: Insufficient documentation

## 2024-04-01 DIAGNOSIS — M79641 Pain in right hand: Secondary | ICD-10-CM | POA: Diagnosis not present

## 2024-04-01 DIAGNOSIS — S92354A Nondisplaced fracture of fifth metatarsal bone, right foot, initial encounter for closed fracture: Secondary | ICD-10-CM | POA: Insufficient documentation

## 2024-04-01 DIAGNOSIS — M25531 Pain in right wrist: Secondary | ICD-10-CM | POA: Diagnosis not present

## 2024-04-01 DIAGNOSIS — S99921A Unspecified injury of right foot, initial encounter: Secondary | ICD-10-CM | POA: Diagnosis present

## 2024-04-01 DIAGNOSIS — W1830XA Fall on same level, unspecified, initial encounter: Secondary | ICD-10-CM | POA: Diagnosis not present

## 2024-04-01 NOTE — ED Provider Notes (Signed)
 [place] [facility] DEPARTMENT AT [facility] Provider Note   CSN: [id number] Arrival date & time: 04/01/24  2013     Patient presents with: Tabitha Oneal is a 68 y.o. female.    Fall     Patient has a history of arthritis diabetes hyperlipidemia chronic back pain, hepatitis C.  Patient presents ED for evaluation after fall.  Patient states she has some troubles with her knee and it caused her to fall today.  She is scheduled for knee surgery in December.  Patient ended up injuring her right ankle and foot as well as her hand.  Patient thinks she may have bumped her head but did not lose consciousness.  She is not having any severe headache.  Not on anticoagulation.  No neck pain  Prior to Admission medications   Medication Sig Start Date End Date Taking? Authorizing Provider  albuterol  (VENTOLIN  HFA) 108 (90 Base) MCG/ACT inhaler Inhale 2 puffs into the lungs every 6 (six) hours as needed for wheezing or shortness of breath. 05/12/23   Iva Marty Saltness, MD  amLODipine  (NORVASC ) 10 MG tablet Take 1 tablet (10 mg total) by mouth daily. 01/14/21   Caro Harlene POUR, NP  aspirin  EC 81 MG tablet Take 1 tablet (81 mg total) by mouth daily. Swallow whole. 03/07/20   Lonni Slain, MD  Azelastine  HCl 137 MCG/SPRAY SOLN USE 1 SPRAY EACH NOSTRIL TWICE DAILY 12/02/23   Iva Marty Saltness, MD  Bepotastine  Besilate 1.5 % SOLN PLACE 1 DROP INTO BOTH EYES IN THE MORNING AND AT BEDTIME. 07/14/22   Iva Marty Saltness, MD  BREO ELLIPTA  200-25 MCG/ACT AEPB TAKE 1 PUFF BY MOUTH EVERY DAY 12/14/23   Iva Marty Saltness, MD  Calcium  Carb-Cholecalciferol (CALCIUM  600 + D PO) Take by mouth.    [provider]  Carbinoxamine  Maleate 4 MG TABS Take 1 tablet (4 mg total) by mouth in the morning and at bedtime. 11/10/23   Iva Marty Saltness, MD  celecoxib (CELEBREX) 200 MG capsule Take 200 mg by mouth 2 (two) times daily as needed. 10/02/22   [provider]  diclofenac Sodium (VOLTAREN) 1 % GEL 3 g 3 (three) times daily. 10/12/21   [provider]  fluticasone  (FLONASE ) 50 MCG/ACT nasal spray SPRAY 2 SPRAYS INTO EACH NOSTRIL EVERY DAY 12/02/23   Iva Marty Saltness, MD  gabapentin  (NEURONTIN ) 100 MG capsule TAKE 1 CAPSULE (100 MG) BY ORAL ROUTE 3 TIMES PER DAY FOR 90 DAYS Patient not taking: Reported on 11/10/2023 10/11/20   Caro Harlene POUR, NP  gabapentin  (NEURONTIN ) 300 MG capsule Take 300 mg by mouth 2 (two) times daily.    [provider]  hydrochlorothiazide  (MICROZIDE ) 12.5 MG capsule Take 1 capsule (12.5 mg total) by mouth daily. 01/18/24   Chandrasekhar, Mahesh A, MD  ipratropium (ATROVENT ) 0.03 % nasal spray PLACE 1 SPRAY INTO BOTH NOSTRILS EVERY 6 (SIX) HOURS AS NEEDED. 01/20/24   Iva Marty Saltness, MD  JANUVIA  100 MG tablet TAKE 1 TABLET BY MOUTH EVERY DAY 01/09/21   Eubanks, Jessica K, NP  levocetirizine (XYZAL ) 5 MG tablet TAKE 1 TABLET BY MOUTH EVERY DAY IN THE EVENING 10/23/23   Iva Marty Saltness, MD  losartan (COZAAR) 50 MG tablet Take 50 mg by mouth daily. 09/16/21   [provider]  metFORMIN  (GLUCOPHAGE ) 500 MG tablet Take 500 mg by mouth 2 (two) times daily. 01/25/21   [provider]  montelukast  (SINGULAIR ) 10 MG tablet TAKE 1  TABLET BY MOUTH EVERY DAY IN THE EVENING 11/20/23   Iva Marty Saltness, MD  omeprazole  (PRILOSEC) 40 MG capsule TAKE 1 CAPSULE (40 MG TOTAL) BY MOUTH DAILY. 01/19/24   Iva Marty Saltness, MD  ONE TOUCH ULTRA TEST test strip Check blood sugar once daily as directed 08/04/14   [provider]  rosuvastatin  (CRESTOR ) 20 MG tablet Take 20 mg by mouth daily. 02/07/22   [provider]  SYMBICORT 160-4.5 MCG/ACT inhaler Inhale 2 puffs into the lungs 2 (two) times daily. 05/12/23   Iva Marty Saltness, MD  tiZANidine  (ZANAFLEX ) 4 MG tablet Take 1 tablet (4 mg total) by mouth every 6 (six) hours as needed (as needed for migraines). 08/19/23    Lomax, Amy, NP  topiramate  (TOPAMAX ) 100 MG tablet TAKE 1 TABLET BY MOUTH EVERYDAY AT BEDTIME 08/19/23   Lomax, Amy, NP    Allergies: Patient has no known allergies.    Review of Systems  Updated Vital Signs BP (!) 166/99 (BP Location: Left Arm)   Pulse 81   Temp 98.2 F (36.8 C) (Oral)   Resp 18   Ht 1.6 m (5' 3)   Wt 95.3 kg   SpO2 98%   BMI 37.20 kg/m   Physical Exam Vitals and nursing note reviewed.  Constitutional:      General: She is not in acute distress.    Appearance: She is well-developed.  HENT:     Head: Normocephalic and atraumatic.     Right Ear: External ear normal.     Left Ear: External ear normal.  Eyes:     General: No scleral icterus.       Right eye: No discharge.        Left eye: No discharge.     Conjunctiva/sclera: Conjunctivae normal.  Neck:     Trachea: No tracheal deviation.  Cardiovascular:     Rate and Rhythm: Normal rate and regular rhythm.  Pulmonary:     Effort: Pulmonary effort is normal. No respiratory distress.     Breath sounds: Normal breath sounds. No stridor. No wheezing or rales.  Abdominal:     General: Bowel sounds are normal. There is no distension.     Palpations: Abdomen is soft.     Tenderness: There is no abdominal tenderness. There is no guarding or rebound.  Musculoskeletal:        General: Tenderness present. No deformity.     Cervical back: Neck supple. No bony tenderness.     Thoracic back: No bony tenderness.     Comments: Tenderness palpation right hand right wrist right foot, no swelling or edema noted, mild tenderness palpation paraspinal region right lumbar area, no midline tenderness  Skin:    General: Skin is warm and dry.     Findings: No rash.  Neurological:     General: No focal deficit present.     Mental Status: She is alert.     Cranial Nerves: No cranial nerve deficit, dysarthria or facial asymmetry.     Sensory: No sensory deficit.     Motor: No abnormal muscle tone or seizure activity.      Coordination: Coordination normal.  Psychiatric:        Mood and Affect: Mood normal.     (all labs ordered are listed, but only abnormal results are displayed) Labs Reviewed - No data to display  EKG: None  Radiology: DG Foot Complete Right Result Date: 04/01/2024 EXAM: 3 OR MORE VIEW(S) XRAY OF THE RIGHT FOOT  04/01/2024 09:19:00 PM COMPARISON: None available. CLINICAL HISTORY: fall, pain FINDINGS: BONES AND JOINTS: Findings suspicious for nondisplaced fracture along the lateral margin of the fifth metatarsal head. Mild degenerative narrowing is seen at the first metatarsophalangeal joint. There is mild hallux valgus. No focal osseous lesion. No joint dislocation. SOFT TISSUES: Questionable punctate radiopaque foreign body within the superficial soft tissues or skin surface development of plantar surface of the mid metatarsals seen only on the lateral view. IMPRESSION: 1. Findings suspicious for nondisplaced fracture along the lateral margin of the fifth metatarsal head. 2. Questionable punctate radiopaque foreign body within the superficial soft tissues or skin surface development of plantar surface of the mid metatarsals. Electronically signed by: Greig Pique MD 04/01/2024 09:23 PM EST RP Workstation: [id number]   DG Hand Complete Right Result Date: 04/01/2024 CLINICAL DATA:  Fall EXAM: RIGHT HAND - COMPLETE 3+ VIEW COMPARISON:  None Available. FINDINGS: No acute fracture or malalignment. Possible old fifth metacarpal fracture deformity. Arthritis at the first IP, MCP and CMC joints. Mild arthritis at the D IP joints. IMPRESSION: No acute osseous abnormality. Electronically Signed   By: Luke Bun M.D.   On: 04/01/2024 21:14   DG Ankle Complete Right Result Date: 04/01/2024 CLINICAL DATA:  Fall, injury EXAM: RIGHT ANKLE - COMPLETE 3+ VIEW COMPARISON:  None Available. FINDINGS: No definitive fracture or malalignment. Ankle mortise is symmetric. There is mild soft tissue swelling IMPRESSION:  Soft tissue swelling without definitive fracture. Electronically Signed   By: Luke Bun M.D.   On: 04/01/2024 21:12     Procedures   Medications Ordered in the ED - No data to display  Clinical Course as of 04/01/24 2152  [facility] Apr 01, 2024  2143 Ankle x-ray without acute fracture [JK]  2144 Hand x-ray without acute abnormality [JK]  2144 X-ray shows possible nondisplaced fracture along the lateral margin of the fifth metatarsal head.  Questionable punctate foreign body noted on the plantar aspect of the foot.  Patient does not have any wound [JK]    Clinical Course User Index [JK] Randol Simmonds, MD                                 Medical Decision Making Problems Addressed: Closed nondisplaced fracture of fifth metatarsal bone of right foot, initial encounter: acute illness or injury  Amount and/or Complexity of Data Reviewed Radiology: ordered and independent interpretation performed.   Patient presented for mechanical fall.  Patient without signs of obvious swelling.  X-rays do not show any signs of fracture in the hand or ankle.  There is possible fracture at the head of the fifth metatarsal.  Patient does have some tenderness in this area.  There is also question of soft tissue metallic foreign body.  Patient however does not have any laceration or wound on her foot.  I do not think this is clinically significant.  Will place the patient in a cam walker boot.  Family states she does have a rollator.  I do agree with them that it would be helpful for her to use that since she has some issues with her left knee causing some instability.  Cam walking boot can be offered.  She will follow-up with her orthopedics doctor as an outpatient     Final diagnoses:  Closed nondisplaced fracture of fifth metatarsal bone of right foot, initial encounter    ED Discharge Orders     None  Randol Simmonds, MD 04/01/24 2157

## 2024-04-01 NOTE — ED Triage Notes (Addendum)
 Pt fell earlier today and was seen at [facility] had was unable to get imaging at that time . Mechanical fall around 2pm, fell on the right side, c/o right ankle pain, right hand/thumb pain, and hit her head. Due for surgery to left knee in December

## 2024-04-01 NOTE — Discharge Instructions (Signed)
 The x-ray did show a possible fracture at the end of the fifth metatarsal.  This is the pinky toe of your foot.  Take over-the-counter medication such as Tylenol  as needed for pain.  Use the cam walker boot and the rollator as we discussed to help with your balance.  Follow-up with your orthopedic doctor to be rechecked

## 2024-04-15 ENCOUNTER — Other Ambulatory Visit: Payer: Self-pay | Admitting: Internal Medicine

## 2024-05-02 ENCOUNTER — Telehealth: Payer: Self-pay | Admitting: Family Medicine

## 2024-05-02 ENCOUNTER — Other Ambulatory Visit: Payer: Self-pay | Admitting: Allergy & Immunology

## 2024-05-02 NOTE — Telephone Encounter (Signed)
 Ilda called to request Pt to have a sooner follow up appt . Pt was referred to [facility] for Cognitive Impairment  and [facility] is stating that medication could be the reason for that impairment  , They would like for MD to scheduled  Pt for an urgent appt to discuss medication . Kajuana recommend to call Pt to schedule appt   topiramate  (TOPAMAX ) 100 MG tablet   Kajuanna call back [phone]

## 2024-05-03 NOTE — Telephone Encounter (Signed)
 Unable to lvm 1st attempt by hf 05/03/24. Will send mychart msg

## 2024-05-05 ENCOUNTER — Ambulatory Visit: Admitting: Allergy & Immunology

## 2024-05-08 ENCOUNTER — Other Ambulatory Visit: Payer: Self-pay | Admitting: Internal Medicine

## 2024-05-11 ENCOUNTER — Other Ambulatory Visit

## 2024-05-27 ENCOUNTER — Other Ambulatory Visit: Payer: Self-pay | Admitting: Allergy & Immunology

## 2024-06-06 ENCOUNTER — Ambulatory Visit (HOSPITAL_BASED_OUTPATIENT_CLINIC_OR_DEPARTMENT_OTHER)
Admission: RE | Admit: 2024-06-06 | Discharge: 2024-06-06 | Disposition: A | Discharge status: Home / Self Care | Source: Ambulatory Visit | Attending: Family | Admitting: Family

## 2024-06-06 DIAGNOSIS — Z13820 Encounter for screening for osteoporosis: Secondary | ICD-10-CM | POA: Insufficient documentation

## 2024-06-06 DIAGNOSIS — M8589 Other specified disorders of bone density and structure, multiple sites: Secondary | ICD-10-CM | POA: Insufficient documentation

## 2024-06-14 ENCOUNTER — Encounter: Payer: Self-pay | Admitting: Allergy & Immunology

## 2024-06-14 ENCOUNTER — Ambulatory Visit: Admitting: Allergy & Immunology

## 2024-06-14 ENCOUNTER — Other Ambulatory Visit: Payer: Self-pay

## 2024-06-14 VITALS — BP 138/74 | HR 90 | Temp 97.9°F | Resp 18 | Ht 62.0 in | Wt 204.9 lb

## 2024-06-14 DIAGNOSIS — J3089 Other allergic rhinitis: Secondary | ICD-10-CM | POA: Diagnosis not present

## 2024-06-14 DIAGNOSIS — K219 Gastro-esophageal reflux disease without esophagitis: Secondary | ICD-10-CM | POA: Diagnosis not present

## 2024-06-14 DIAGNOSIS — J302 Other seasonal allergic rhinitis: Secondary | ICD-10-CM

## 2024-06-14 DIAGNOSIS — J4540 Moderate persistent asthma, uncomplicated: Secondary | ICD-10-CM

## 2024-06-14 MED ORDER — MONTELUKAST SODIUM 10 MG PO TABS: 10.0000 mg | ORAL_TABLET | Freq: Every day | ORAL | 1 refills | Status: AC

## 2024-06-14 MED ORDER — CARBINOXAMINE MALEATE 4 MG PO TABS: 4.0000 mg | ORAL_TABLET | Freq: Two times a day (BID) | ORAL | 1 refills | Status: AC

## 2024-06-14 MED ORDER — FLUTICASONE PROPIONATE 50 MCG/ACT NA SUSP: NASAL | 1 refills | Status: AC

## 2024-06-14 MED ORDER — IPRATROPIUM BROMIDE 0.03 % NA SOLN: 2.0000 | Freq: Two times a day (BID) | NASAL | 1 refills | Status: AC

## 2024-06-14 MED ORDER — FLUTICASONE FUROATE-VILANTEROL 200-25 MCG/ACT IN AEPB: 1.0000 | INHALATION_SPRAY | Freq: Every day | RESPIRATORY_TRACT | 5 refills | Status: AC

## 2024-06-14 NOTE — Patient Instructions (Addendum)
 1. Moderate persistent asthma, uncomplicated - Lung testing looks amazing today (well over 100%).  - We are not going to make any changes today at all. - Daily controller medication(s): Breo 100/25mcg one puff once daily - Prior to physical activity: albuterol  2 puffs 10-15 minutes before physical activity. - Rescue medications: albuterol  4 puffs every 4-6 hours as needed or albuterol  nebulizer one vial every 4-6 hours as needed - Asthma control goals:  * Full participation in all desired activities (may need albuterol  before activity) * Albuterol  use two time or less a week on average (not counting use with activity) * Cough interfering with sleep two time or less a month * Oral steroids no more than once a year * No hospitalizations  2. Chronic rhinitis - STOP the azelastine .  - Continue with nasal ipratropium one spray per nostril TWICE daily (AIM FOR THE EARS).  - Continue with fluticasone  nasal spray two sprays per nostril TWICE daily (AIM FOR THE EARS). - Continue with Singulair  (montelukast ) 10mg  daily.  - Continue with carbinoxamine  4mg  TWICE daily to help with the runny nose.      3. GERD  - Continue omeprazole  40mg  daily.  4. Return in about 6 months (around 12/12/2024). You can have the follow up appointment with Dr. Iva or a Nurse Practicioner (our Nurse Practitioners are excellent and always have Physician oversight!).    Please inform us  of any Emergency Department visits, hospitalizations, or changes in symptoms. Call us  before going to the ED for breathing or allergy symptoms since we might be able to fit you in for a sick visit. Feel free to contact us  anytime with any questions, problems, or concerns.  It was a pleasure to see you again today!  Websites that have reliable patient information: 1. American Academy of Asthma, Allergy, and Immunology: www.aaaai.org 2. Food Allergy Research and Education (FARE): foodallergy.org 3. Mothers of Asthmatics:  http://www.asthmacommunitynetwork.org 4. American College of Allergy, Asthma, and Immunology: www.acaai.org      Like us  on [employer] and Instagram for our latest updates!      A healthy democracy works best when [employer] participate! Make sure you are registered to vote! If you have moved or changed any of your contact information, you will need to get this updated before voting! Scan the QR codes below to learn more!

## 2024-06-14 NOTE — Progress Notes (Signed)
 "  FOLLOW UP  Date of Service/Encounter:  06/14/24   Assessment:   Moderate persistent asthma, uncomplicated    Seasonal and perennial allergic rhinitis (trees, mold, dust mite)   Gastroesophageal reflux disease - doing better with PPI Anemia   Recurrent infections - most recent workup within normal limits   Recent left knee surgery - recovering well  Plan/Recommendations:   1. Moderate persistent asthma, uncomplicated - Lung testing looks amazing today (well over 100%).  - We are not going to make any changes today at all. - Daily controller medication(s): Breo 100/25mcg one puff once daily - Prior to physical activity: albuterol  2 puffs 10-15 minutes before physical activity. - Rescue medications: albuterol  4 puffs every 4-6 hours as needed or albuterol  nebulizer one vial every 4-6 hours as needed - Asthma control goals:  * Full participation in all desired activities (may need albuterol  before activity) * Albuterol  use two time or less a week on average (not counting use with activity) * Cough interfering with sleep two time or less a month * Oral steroids no more than once a year * No hospitalizations  2. Chronic rhinitis - STOP the azelastine .  - Continue with nasal ipratropium one spray per nostril TWICE daily (AIM FOR THE EARS).  - Continue with fluticasone  nasal spray two sprays per nostril TWICE daily (AIM FOR THE EARS). - Continue with Singulair  (montelukast ) 10mg  daily.  - Continue with carbinoxamine  4mg  TWICE daily to help with the runny nose.      3. GERD  - Continue omeprazole  40mg  daily.  4. Return in about 6 months (around 12/12/2024). You can have the follow up appointment with Dr. Iva or a Nurse Practicioner (our Nurse Practitioners are excellent and always have Physician oversight!).   Subjective:   Tabitha Oneal is a 69 y.o. 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 07/19/2019   Abnormal EKG 03/02/2019   Incomplete left bundle branch block (LBBB) 03/02/2019   Rebound headache 12/13/2018   Overuse of medication 12/13/2018   Intractable chronic migraine without aura and without status migrainosus 12/13/2018   Microcytic anemia 02/24/2018   High risk medication use 08/06/2017   Seasonal and perennial allergic rhinitis 07/30/2017   Recurrent infections 07/30/2017   Cough 06/08/2017   Moderate persistent asthma without complication 06/08/2017   Chronic constipation 02/04/2017   Hyperlipidemia due to type 2 diabetes mellitus (HCC) 02/04/2017   Hallux rigidus, left foot 01/01/2017   Postmenopausal symptoms 02/22/2016   Arthrofibrosis of knee joint 08/28/2015   Left hip pain 10/08/2014   Type 2 diabetes mellitus without complication, without long-term current use of insulin  (HCC) 10/08/2014   Hyperlipidemia associated with type 2 diabetes mellitus (HCC) 10/08/2014   Primary hypertension 08/30/2014   Diabetes mellitus, type 2 (HCC) 08/30/2014   Diverticulosis of colon without hemorrhage 08/30/2014   Chronic low back pain 08/30/2014   Chronic pain syndrome 08/30/2014   Asthma in adult without complication 08/30/2014   Contracture of left knee, arthrofibrosis post op TKA 04/09/2012   Osteoarthritis of left knee 02/23/2012    History  obtained from: chart review and patient.  Discussed the use of AI scribe software for clinical note transcription with the patient and/or guardian, who gave verbal consent to proceed.  Tabitha Oneal is a 69 y.o. female presenting for a follow up visit.  She was last seen in June 2025.  At that time, lung  testing looked amazing.  We continue with the Breo 100 mcg 1 puff once daily as well as albuterol  as needed.  For her rhinitis, we continue with Atrovent  as well as Flonase  and Singulair .  We did add on carbinoxamine  4 mg twice daily as well.  We continue with omeprazole  daily.  Since last visit, she has done well.  She underwent left knee surgery in December, with a prolonged recovery period, taking about a month to regain mobility. Initially planned as an outpatient procedure, she stayed in the hospital for two nights post-surgery. She experiences intermittent pain both during the day and at night. Her daughter stayed with her for a month post-surgery to assist with her recovery. Her daughter, who works in child support services for [employer], took time off work to care for her mother post-surgery and has recently returned to work. She is just now starting her physical therapy.  Asthma/Respiratory Symptom History: She manages her symptoms with Breo, taking one puff daily, and finds it affordable after meeting her deductible. Tabitha Oneal's asthma has been well controlled. She has not required rescue medication, experienced nocturnal awakenings due to lower respiratory symptoms, nor have activities of daily living been limited. She has required no Emergency Department or Urgent Care visits for her asthma. She has required zero courses of systemic steroids for asthma exacerbations since the last visit. ACT score today is 25, indicating excellent asthma symptom control.   Allergic Rhinitis Symptom History: She does have some rhinorrhea. She uses Flonase  and Atrovent  nasal sprays twice daily for her persistent rhinorrhea, and takes carbinoxamine , which helps but may cause drowsiness. No epistaxis or recent ear or sinus infections.  She seems pretty happy with how well her allergies are controlled.  She remains on montelukast .  This seems to be working well.  GERD Symptom History: She remains on omeprazole   daily.  Otherwise, there have been no changes to her past medical history, surgical history, family history, or social history.    Review of systems otherwise negative other than that mentioned in the HPI.    Objective:   Blood pressure 138/74, pulse 90, temperature 97.9 F (36.6 C), temperature source Temporal, resp. rate 18, height 5' 2 (1.575 m), weight 204 lb 14.4 oz (92.9 kg), SpO2 97%. Body mass index is 37.48 kg/m.    Physical Exam Vitals reviewed.  Constitutional:      Appearance: She is well-developed.     Comments: Pleasant. Cooperative with the exam.  Smiling.  HENT:     Head: Normocephalic and atraumatic.     Right Ear: Tympanic membrane, ear canal and external ear normal.     Left Ear: Tympanic membrane, ear canal and external ear normal.     Nose: No nasal deformity, septal deviation, mucosal edema or rhinorrhea.     Right Turbinates: Enlarged, swollen and pale.     Left Turbinates: Enlarged, swollen and pale.     Right Sinus: No maxillary sinus tenderness or frontal sinus tenderness.     Left Sinus: No maxillary sinus tenderness or frontal sinus tenderness.     Comments: No polyps noted.     Mouth/Throat:     Lips: Pink.  Mouth: Mucous membranes are moist. Mucous membranes are not pale and not dry.     Pharynx: Uvula midline.  Eyes:     General: Lids are normal. No allergic shiner.       Right eye: No discharge.        Left eye: No discharge.     Conjunctiva/sclera: Conjunctivae normal.     Right eye: Right conjunctiva is not injected. No chemosis.    Left eye: Left conjunctiva is not injected. No chemosis.    Pupils: Pupils are equal, round, and reactive to light.  Cardiovascular:     Rate and Rhythm: Normal rate and regular rhythm.     Heart sounds: Normal heart sounds.  Pulmonary:     Effort: Pulmonary effort is normal. No tachypnea, accessory muscle usage or respiratory distress.     Breath sounds: Normal breath sounds. No wheezing, rhonchi or  rales.     Comments: Moving air well in all lung fields.  Chest:     Chest wall: No tenderness.  Lymphadenopathy:     Cervical: No cervical adenopathy.  Skin:    Coloration: Skin is not pale.     Findings: No abrasion, erythema, petechiae or rash. Rash is not papular, urticarial or vesicular.  Neurological:     Mental Status: She is alert.  Psychiatric:        Behavior: Behavior is cooperative.      Diagnostic studies:    Spirometry: results normal (FEV1: 1.80/104%, FVC: 2.43/111%, FEV1/FVC: 74%).    Spirometry consistent with normal pattern.   Allergy Studies: none      Marty Shaggy, MD  [facility]        "

## 2024-08-18 ENCOUNTER — Ambulatory Visit: Admitting: Family Medicine

## 2024-08-23 ENCOUNTER — Ambulatory Visit: Admitting: Family Medicine

## 2024-12-13 ENCOUNTER — Ambulatory Visit: Admitting: Allergy & Immunology
